# Patient Record
Sex: Female | Born: 1945
Health system: Southern US, Community
[De-identification: ages and names within clinical notes are randomized; demographics above are authoritative.]

## PROBLEM LIST (undated history)

## (undated) DIAGNOSIS — I5022 Chronic systolic (congestive) heart failure: Secondary | ICD-10-CM

## (undated) DIAGNOSIS — Z9989 Dependence on other enabling machines and devices: Secondary | ICD-10-CM

## (undated) DIAGNOSIS — Z95810 Presence of automatic (implantable) cardiac defibrillator: Secondary | ICD-10-CM

## (undated) DIAGNOSIS — E119 Type 2 diabetes mellitus without complications: Secondary | ICD-10-CM

## (undated) DIAGNOSIS — R269 Unspecified abnormalities of gait and mobility: Secondary | ICD-10-CM

## (undated) DIAGNOSIS — G4733 Obstructive sleep apnea (adult) (pediatric): Secondary | ICD-10-CM

## (undated) DIAGNOSIS — E669 Obesity, unspecified: Secondary | ICD-10-CM

## (undated) DIAGNOSIS — E042 Nontoxic multinodular goiter: Secondary | ICD-10-CM

## (undated) DIAGNOSIS — K759 Inflammatory liver disease, unspecified: Secondary | ICD-10-CM

## (undated) DIAGNOSIS — M545 Low back pain, unspecified: Secondary | ICD-10-CM

## (undated) DIAGNOSIS — G629 Polyneuropathy, unspecified: Secondary | ICD-10-CM

## (undated) DIAGNOSIS — I2510 Atherosclerotic heart disease of native coronary artery without angina pectoris: Secondary | ICD-10-CM

## (undated) DIAGNOSIS — J189 Pneumonia, unspecified organism: Secondary | ICD-10-CM

## (undated) DIAGNOSIS — H4920 Sixth [abducent] nerve palsy, unspecified eye: Secondary | ICD-10-CM

## (undated) DIAGNOSIS — N3090 Cystitis, unspecified without hematuria: Secondary | ICD-10-CM

## (undated) DIAGNOSIS — E785 Hyperlipidemia, unspecified: Secondary | ICD-10-CM

## (undated) DIAGNOSIS — J302 Other seasonal allergic rhinitis: Secondary | ICD-10-CM

## (undated) DIAGNOSIS — I119 Hypertensive heart disease without heart failure: Secondary | ICD-10-CM

## (undated) DIAGNOSIS — R413 Other amnesia: Secondary | ICD-10-CM

## (undated) DIAGNOSIS — G510 Bell's palsy: Secondary | ICD-10-CM

## (undated) DIAGNOSIS — G8929 Other chronic pain: Secondary | ICD-10-CM

## (undated) DIAGNOSIS — I639 Cerebral infarction, unspecified: Secondary | ICD-10-CM

## (undated) HISTORY — PX: OTHER SURGICAL HISTORY: SHX169

## (undated) HISTORY — DX: Other amnesia: R41.3

## (undated) HISTORY — PX: TUBAL LIGATION: SHX77

## (undated) HISTORY — PX: INSERT / REPLACE / REMOVE PACEMAKER: SUR710

## (undated) HISTORY — DX: Bell's palsy: G51.0

## (undated) HISTORY — DX: Obesity, unspecified: E66.9

## (undated) HISTORY — DX: Hypertensive heart disease without heart failure: I11.9

## (undated) HISTORY — DX: Unspecified abnormalities of gait and mobility: R26.9

## (undated) HISTORY — DX: Inflammatory liver disease, unspecified: K75.9

## (undated) HISTORY — DX: Polyneuropathy, unspecified: G62.9

## (undated) HISTORY — DX: Other seasonal allergic rhinitis: J30.2

## (undated) HISTORY — DX: Hyperlipidemia, unspecified: E78.5

## (undated) HISTORY — DX: Nontoxic multinodular goiter: E04.2

## (undated) HISTORY — PX: PLANTAR FASCIA RELEASE: SHX2239

## (undated) HISTORY — PX: CARPAL TUNNEL RELEASE: SHX101

## (undated) HISTORY — PX: CYSTECTOMY: SUR359

## (undated) HISTORY — DX: Sixth (abducent) nerve palsy, unspecified eye: H49.20

---

## 1998-09-13 HISTORY — PX: CORONARY ARTERY BYPASS GRAFT: SHX141

## 1999-05-15 ENCOUNTER — Other Ambulatory Visit: Admission: RE | Admit: 1999-05-15 | Discharge: 1999-05-15 | Payer: Self-pay | Admitting: Internal Medicine

## 1999-07-20 ENCOUNTER — Encounter: Payer: Self-pay | Admitting: Cardiology

## 1999-07-20 ENCOUNTER — Inpatient Hospital Stay (HOSPITAL_COMMUNITY): Admission: EM | Admit: 1999-07-20 | Discharge: 1999-07-26 | Payer: Self-pay | Admitting: Emergency Medicine

## 1999-07-21 ENCOUNTER — Encounter: Payer: Self-pay | Admitting: Internal Medicine

## 1999-07-22 ENCOUNTER — Encounter: Payer: Self-pay | Admitting: Cardiology

## 1999-07-23 ENCOUNTER — Encounter: Payer: Self-pay | Admitting: Cardiology

## 1999-07-24 ENCOUNTER — Encounter: Payer: Self-pay | Admitting: Surgery

## 1999-07-26 ENCOUNTER — Encounter: Payer: Self-pay | Admitting: Internal Medicine

## 1999-08-25 ENCOUNTER — Encounter (HOSPITAL_COMMUNITY): Admission: RE | Admit: 1999-08-25 | Discharge: 1999-11-23 | Payer: Self-pay | Admitting: Cardiology

## 1999-12-15 ENCOUNTER — Encounter: Admission: RE | Admit: 1999-12-15 | Discharge: 2000-03-14 | Payer: Self-pay | Admitting: Orthopedic Surgery

## 2000-03-21 ENCOUNTER — Encounter: Admission: RE | Admit: 2000-03-21 | Discharge: 2000-06-19 | Payer: Self-pay | Admitting: Orthopedic Surgery

## 2001-07-20 ENCOUNTER — Other Ambulatory Visit: Admission: RE | Admit: 2001-07-20 | Discharge: 2001-07-20 | Payer: Self-pay | Admitting: Internal Medicine

## 2001-10-18 ENCOUNTER — Encounter: Payer: Self-pay | Admitting: Internal Medicine

## 2002-04-25 ENCOUNTER — Encounter: Payer: Self-pay | Admitting: Internal Medicine

## 2004-05-06 ENCOUNTER — Encounter: Payer: Self-pay | Admitting: Internal Medicine

## 2004-05-06 ENCOUNTER — Other Ambulatory Visit: Admission: RE | Admit: 2004-05-06 | Discharge: 2004-05-06 | Payer: Self-pay | Admitting: Internal Medicine

## 2004-08-12 ENCOUNTER — Ambulatory Visit: Payer: Self-pay | Admitting: Internal Medicine

## 2004-11-11 ENCOUNTER — Ambulatory Visit: Payer: Self-pay | Admitting: Internal Medicine

## 2004-11-18 ENCOUNTER — Ambulatory Visit: Payer: Self-pay | Admitting: Internal Medicine

## 2005-02-25 ENCOUNTER — Ambulatory Visit: Payer: Self-pay | Admitting: Internal Medicine

## 2005-05-26 ENCOUNTER — Ambulatory Visit: Payer: Self-pay | Admitting: Internal Medicine

## 2005-08-25 ENCOUNTER — Ambulatory Visit: Payer: Self-pay | Admitting: Internal Medicine

## 2005-09-28 ENCOUNTER — Ambulatory Visit: Payer: Self-pay | Admitting: Internal Medicine

## 2005-11-22 ENCOUNTER — Ambulatory Visit: Payer: Self-pay | Admitting: Internal Medicine

## 2005-12-16 ENCOUNTER — Ambulatory Visit (HOSPITAL_BASED_OUTPATIENT_CLINIC_OR_DEPARTMENT_OTHER): Admission: RE | Admit: 2005-12-16 | Discharge: 2005-12-16 | Payer: Self-pay | Admitting: Neurology

## 2005-12-19 ENCOUNTER — Ambulatory Visit: Payer: Self-pay | Admitting: Internal Medicine

## 2006-01-05 ENCOUNTER — Ambulatory Visit: Payer: Self-pay | Admitting: Pulmonary Disease

## 2006-01-31 ENCOUNTER — Ambulatory Visit: Payer: Self-pay | Admitting: Pulmonary Disease

## 2006-01-31 ENCOUNTER — Ambulatory Visit (HOSPITAL_BASED_OUTPATIENT_CLINIC_OR_DEPARTMENT_OTHER): Admission: RE | Admit: 2006-01-31 | Discharge: 2006-01-31 | Payer: Self-pay | Admitting: Pulmonary Disease

## 2006-02-25 ENCOUNTER — Ambulatory Visit: Payer: Self-pay | Admitting: Internal Medicine

## 2006-03-04 ENCOUNTER — Ambulatory Visit: Payer: Self-pay | Admitting: Internal Medicine

## 2006-03-07 ENCOUNTER — Ambulatory Visit: Payer: Self-pay | Admitting: Pulmonary Disease

## 2006-03-30 ENCOUNTER — Encounter: Payer: Self-pay | Admitting: Internal Medicine

## 2006-04-26 ENCOUNTER — Ambulatory Visit: Payer: Self-pay | Admitting: Pulmonary Disease

## 2006-05-26 ENCOUNTER — Ambulatory Visit: Payer: Self-pay | Admitting: Internal Medicine

## 2006-08-15 ENCOUNTER — Ambulatory Visit: Payer: Self-pay | Admitting: Internal Medicine

## 2006-08-15 LAB — CONVERTED CEMR LAB: Hgb A1c MFr Bld: 7.4 % — ABNORMAL HIGH (ref 4.6–6.0)

## 2006-08-22 ENCOUNTER — Ambulatory Visit: Payer: Self-pay | Admitting: Internal Medicine

## 2006-11-15 ENCOUNTER — Ambulatory Visit: Payer: Self-pay | Admitting: Internal Medicine

## 2006-11-15 LAB — CONVERTED CEMR LAB: Hgb A1c MFr Bld: 8.5 % — ABNORMAL HIGH (ref 4.6–6.0)

## 2007-01-30 ENCOUNTER — Encounter: Payer: Self-pay | Admitting: Internal Medicine

## 2007-01-30 DIAGNOSIS — I2510 Atherosclerotic heart disease of native coronary artery without angina pectoris: Secondary | ICD-10-CM | POA: Insufficient documentation

## 2007-01-30 DIAGNOSIS — I119 Hypertensive heart disease without heart failure: Secondary | ICD-10-CM | POA: Insufficient documentation

## 2007-01-30 DIAGNOSIS — J309 Allergic rhinitis, unspecified: Secondary | ICD-10-CM | POA: Insufficient documentation

## 2007-01-30 DIAGNOSIS — E1040 Type 1 diabetes mellitus with diabetic neuropathy, unspecified: Secondary | ICD-10-CM | POA: Insufficient documentation

## 2007-03-07 ENCOUNTER — Ambulatory Visit: Payer: Self-pay | Admitting: Internal Medicine

## 2007-03-07 LAB — CONVERTED CEMR LAB: Hgb A1c MFr Bld: 7.6 % — ABNORMAL HIGH (ref 4.6–6.0)

## 2007-03-08 ENCOUNTER — Encounter: Payer: Self-pay | Admitting: Internal Medicine

## 2007-06-06 ENCOUNTER — Ambulatory Visit: Payer: Self-pay | Admitting: Internal Medicine

## 2007-06-06 LAB — CONVERTED CEMR LAB
ALT: 82 units/L — ABNORMAL HIGH (ref 0–35)
AST: 64 units/L — ABNORMAL HIGH (ref 0–37)
Albumin: 4.5 g/dL (ref 3.5–5.2)
Basophils Absolute: 0 10*3/uL (ref 0.0–0.1)
Calcium: 10.5 mg/dL (ref 8.4–10.5)
Chloride: 100 meq/L (ref 96–112)
Creatinine,U: 136.3 mg/dL
Eosinophils Absolute: 0.1 10*3/uL (ref 0.0–0.6)
GFR calc non Af Amer: 78 mL/min
Hgb A1c MFr Bld: 7.9 % — ABNORMAL HIGH (ref 4.6–6.0)
MCHC: 34.6 g/dL (ref 30.0–36.0)
MCV: 92.5 fL (ref 78.0–100.0)
Monocytes Relative: 9.8 % (ref 3.0–11.0)
Platelets: 274 10*3/uL (ref 150–400)
RBC: 4.56 M/uL (ref 3.87–5.11)
Sodium: 139 meq/L (ref 135–145)
TSH: 0.85 microintl units/mL (ref 0.35–5.50)

## 2007-06-07 ENCOUNTER — Telehealth: Payer: Self-pay | Admitting: Internal Medicine

## 2007-06-09 ENCOUNTER — Ambulatory Visit: Payer: Self-pay | Admitting: Internal Medicine

## 2007-06-09 DIAGNOSIS — R110 Nausea: Secondary | ICD-10-CM | POA: Insufficient documentation

## 2007-08-28 ENCOUNTER — Ambulatory Visit: Payer: Self-pay | Admitting: Internal Medicine

## 2007-11-27 ENCOUNTER — Ambulatory Visit: Payer: Self-pay | Admitting: Internal Medicine

## 2007-11-28 LAB — CONVERTED CEMR LAB
Alkaline Phosphatase: 53 units/L (ref 39–117)
Bilirubin, Direct: 0.1 mg/dL (ref 0.0–0.3)
Hgb A1c MFr Bld: 8.7 % — ABNORMAL HIGH (ref 4.6–6.0)
Total Protein: 7 g/dL (ref 6.0–8.3)

## 2007-12-06 ENCOUNTER — Ambulatory Visit: Payer: Self-pay | Admitting: Internal Medicine

## 2007-12-06 DIAGNOSIS — K759 Inflammatory liver disease, unspecified: Secondary | ICD-10-CM | POA: Insufficient documentation

## 2007-12-06 LAB — CONVERTED CEMR LAB
Hep B Core Total Ab: NEGATIVE
Hepatitis B Surface Ag: NEGATIVE

## 2007-12-15 ENCOUNTER — Telehealth: Payer: Self-pay | Admitting: Internal Medicine

## 2008-01-04 ENCOUNTER — Ambulatory Visit: Payer: Self-pay | Admitting: Internal Medicine

## 2008-01-18 ENCOUNTER — Ambulatory Visit: Payer: Self-pay | Admitting: Internal Medicine

## 2008-03-11 ENCOUNTER — Encounter: Payer: Self-pay | Admitting: Internal Medicine

## 2008-03-11 ENCOUNTER — Telehealth: Payer: Self-pay | Admitting: Internal Medicine

## 2008-04-18 ENCOUNTER — Ambulatory Visit: Payer: Self-pay | Admitting: Internal Medicine

## 2008-06-26 ENCOUNTER — Encounter (INDEPENDENT_AMBULATORY_CARE_PROVIDER_SITE_OTHER): Payer: Self-pay

## 2008-07-12 ENCOUNTER — Ambulatory Visit: Payer: Self-pay | Admitting: Internal Medicine

## 2008-07-12 LAB — CONVERTED CEMR LAB: Hgb A1c MFr Bld: 8.8 % — ABNORMAL HIGH (ref 4.6–6.0)

## 2008-08-07 ENCOUNTER — Ambulatory Visit: Payer: Self-pay | Admitting: Internal Medicine

## 2008-09-27 ENCOUNTER — Ambulatory Visit: Payer: Self-pay | Admitting: Internal Medicine

## 2008-12-26 ENCOUNTER — Ambulatory Visit: Payer: Self-pay | Admitting: Internal Medicine

## 2009-02-25 ENCOUNTER — Encounter: Payer: Self-pay | Admitting: Internal Medicine

## 2009-03-21 ENCOUNTER — Ambulatory Visit: Payer: Self-pay | Admitting: Internal Medicine

## 2009-03-21 LAB — CONVERTED CEMR LAB
AST: 49 units/L — ABNORMAL HIGH (ref 0–37)
Albumin: 4 g/dL (ref 3.5–5.2)
Alkaline Phosphatase: 60 units/L (ref 39–117)
Basophils Absolute: 0.1 10*3/uL (ref 0.0–0.1)
Basophils Relative: 1.1 % (ref 0.0–3.0)
Bilirubin, Direct: 0 mg/dL (ref 0.0–0.3)
Blood in Urine, dipstick: NEGATIVE
Calcium: 9.8 mg/dL (ref 8.4–10.5)
Creatinine, Ser: 0.7 mg/dL (ref 0.4–1.2)
Creatinine,U: 83.8 mg/dL
Direct LDL: 157.2 mg/dL
GFR calc non Af Amer: 89.94 mL/min (ref >60)
HDL: 37.9 mg/dL — ABNORMAL LOW (ref >39.00)
Hemoglobin: 14 g/dL (ref 12.0–15.0)
Ketones, urine, test strip: NEGATIVE
Lymphocytes Relative: 40.2 % (ref 12.0–46.0)
Microalb, Ur: 0.6 mg/dL (ref 0.0–1.9)
Monocytes Relative: 10.8 % (ref 3.0–12.0)
Neutro Abs: 2.1 10*3/uL (ref 1.4–7.7)
Neutrophils Relative %: 44.4 % (ref 43.0–77.0)
Nitrite: NEGATIVE
Protein, U semiquant: NEGATIVE
RBC: 4.36 M/uL (ref 3.87–5.11)
Sodium: 140 meq/L (ref 135–145)
Total CHOL/HDL Ratio: 6
Total Protein: 7.3 g/dL (ref 6.0–8.3)
Triglycerides: 178 mg/dL — ABNORMAL HIGH (ref 0.0–149.0)
Urobilinogen, UA: 0.2
VLDL: 35.6 mg/dL (ref 0.0–40.0)

## 2009-04-03 ENCOUNTER — Ambulatory Visit: Payer: Self-pay | Admitting: Internal Medicine

## 2009-04-24 ENCOUNTER — Encounter: Payer: Self-pay | Admitting: Internal Medicine

## 2009-06-13 ENCOUNTER — Telehealth: Payer: Self-pay | Admitting: Internal Medicine

## 2009-06-17 ENCOUNTER — Telehealth (INDEPENDENT_AMBULATORY_CARE_PROVIDER_SITE_OTHER): Payer: Self-pay | Admitting: *Deleted

## 2009-06-17 ENCOUNTER — Ambulatory Visit: Payer: Self-pay | Admitting: Internal Medicine

## 2009-08-05 ENCOUNTER — Ambulatory Visit: Payer: Self-pay | Admitting: Internal Medicine

## 2009-08-05 LAB — CONVERTED CEMR LAB: Hgb A1c MFr Bld: 9 % — ABNORMAL HIGH (ref 4.6–6.5)

## 2009-11-04 ENCOUNTER — Ambulatory Visit: Payer: Self-pay | Admitting: Internal Medicine

## 2009-11-05 LAB — CONVERTED CEMR LAB: Hgb A1c MFr Bld: 8.8 % — ABNORMAL HIGH (ref 4.6–6.5)

## 2009-11-06 ENCOUNTER — Telehealth: Payer: Self-pay

## 2009-12-01 ENCOUNTER — Telehealth: Payer: Self-pay

## 2009-12-03 ENCOUNTER — Telehealth: Payer: Self-pay | Admitting: Internal Medicine

## 2010-01-05 ENCOUNTER — Ambulatory Visit: Payer: Self-pay | Admitting: Internal Medicine

## 2010-01-05 DIAGNOSIS — G510 Bell's palsy: Secondary | ICD-10-CM | POA: Insufficient documentation

## 2010-01-05 HISTORY — DX: Bell's palsy: G51.0

## 2010-02-24 ENCOUNTER — Encounter: Payer: Self-pay | Admitting: Internal Medicine

## 2010-04-03 ENCOUNTER — Ambulatory Visit: Payer: Self-pay | Admitting: Vascular Surgery

## 2010-04-03 ENCOUNTER — Encounter: Payer: Self-pay | Admitting: Internal Medicine

## 2010-04-06 ENCOUNTER — Ambulatory Visit: Payer: Self-pay | Admitting: Internal Medicine

## 2010-04-06 LAB — HM DIABETES EYE EXAM: HM Diabetic Eye Exam: NORMAL

## 2010-04-07 ENCOUNTER — Telehealth: Payer: Self-pay

## 2010-04-24 ENCOUNTER — Ambulatory Visit: Payer: Self-pay | Admitting: Endocrinology

## 2010-05-04 ENCOUNTER — Encounter: Admission: RE | Admit: 2010-05-04 | Discharge: 2010-05-04 | Payer: Self-pay | Admitting: Endocrinology

## 2010-05-14 ENCOUNTER — Ambulatory Visit: Payer: Self-pay | Admitting: Endocrinology

## 2010-05-14 DIAGNOSIS — E042 Nontoxic multinodular goiter: Secondary | ICD-10-CM | POA: Insufficient documentation

## 2010-05-14 HISTORY — PX: BIOPSY THYROID: PRO38

## 2010-05-15 LAB — CONVERTED CEMR LAB: TSH: 1.1 microintl units/mL (ref 0.35–5.50)

## 2010-05-28 ENCOUNTER — Ambulatory Visit: Payer: Self-pay | Admitting: Endocrinology

## 2010-06-10 ENCOUNTER — Other Ambulatory Visit: Admission: RE | Admit: 2010-06-10 | Discharge: 2010-06-10 | Payer: Self-pay | Admitting: Interventional Radiology

## 2010-06-10 ENCOUNTER — Encounter: Admission: RE | Admit: 2010-06-10 | Discharge: 2010-06-10 | Payer: Self-pay | Admitting: Endocrinology

## 2010-06-10 ENCOUNTER — Encounter: Payer: Self-pay | Admitting: Endocrinology

## 2010-06-26 ENCOUNTER — Ambulatory Visit: Payer: Self-pay | Admitting: Endocrinology

## 2010-06-30 ENCOUNTER — Ambulatory Visit: Payer: Self-pay | Admitting: Internal Medicine

## 2010-06-30 LAB — HM DIABETES FOOT EXAM

## 2010-07-31 ENCOUNTER — Ambulatory Visit: Payer: Self-pay | Admitting: Endocrinology

## 2010-08-10 ENCOUNTER — Telehealth: Payer: Self-pay | Admitting: Endocrinology

## 2010-08-28 ENCOUNTER — Encounter: Payer: Self-pay | Admitting: Endocrinology

## 2010-08-28 ENCOUNTER — Ambulatory Visit: Payer: Self-pay | Admitting: Endocrinology

## 2010-09-16 ENCOUNTER — Telehealth: Payer: Self-pay | Admitting: Internal Medicine

## 2010-10-13 NOTE — Assessment & Plan Note (Signed)
Summary: 3 wk f/u #/cd   Vital Signs:  Patient profile:   65 year old female Height:      63 inches (160.02 cm) Weight:      192.50 pounds (87.50 kg) BMI:     34.22 O2 Sat:      94 % on Room air Temp:     98.8 degrees F (37.11 degrees C) oral Pulse rate:   86 / minute BP sitting:   134 / 70  (left arm) Cuff size:   regular  Vitals Entered By: Brenton Grills MA (June 26, 2010 9:09 AM)  O2 Flow:  Room air CC: 3 week F/U/aj Is Patient Diabetic? Yes   CC:  3 week F/U/aj.  History of Present Illness: she brings a record of her cbg's which i have reviewed today.  it varies from mid-100's to 300.  it is lowest before breakfast and lunch.  it is highest before supper, and at hs.  pt states she feels well in general.   Current Medications (verified): 1)  Astelin 137 Mcg/spray Soln (Azelastine Hcl) .... Spray 2 Spray Into Both Nostrils Twice A Day 2)  Chlorthalidone 25 Mg Tabs (Chlorthalidone) .[name].. 1 Tablet By Mouth Once A Day 3)  Fexofenadine Hcl 180 Mg Tabs (Fexofenadine Hcl) .[name].. 1 Once Daily 4)  Lisinopril 40 Mg Tabs (Lisinopril) .... Take 1 Tablet By Mouth Once A Day 5)  Metformin Hcl 1000 Mg Tabs (Metformin Hcl) .[name].. 1 By Mouth Once Daily 6)  Potassium Chloride Crys Cr 20 Meq Tbcr (Potassium Chloride Crys Cr) .... Take 1 Tablet By Mouth Once Daily 7)  Toprol Xl 50 Mg Tb24 (Metoprolol Succinate) .... Take 1 Tablet By Mouth Once A Day 8)  Multivitamins   Tabs (Multiple Vitamin) .... Once Daily 9)  Calcium 500 Mg  Tabs (Calcium) .... Two Times A Day 10)  Tricor 145 Mg  Tabs (Fenofibrate) .... Take 1 Tablet By Mouth Once A Day 11)  Adult Aspirin Ec Low Strength 81 Mg  Tbec (Aspirin) .[name].. 1 Once Daily 12)  Lantus Solostar 100 Unit/ml  Soln (Insulin Glargine) .... 40 Units Two Times A Day 13)  Humalog Kwikpen 100 Unit/ml  Soln (Insulin Lispro (Human)) .... Three Times A Day (Just Before Each Meal) 70-70-100 Units 14)  Accu-Chek Compact Test Drum   Strp (Glucose Blood) .... Use Four  Times A Day 15)  Ketoconazole 2 % Crea (Ketoconazole) .... Apply As Needed 16)  Bd U/f Short Pen Needle 31g X 8 Mm Misc (Insulin Pen Needle) .... Use Qid and Hs 17)  Crestor 10 Mg Tabs (Rosuvastatin Calcium) .... One Tablet Twice Weekly  Allergies (verified): No Known Drug Allergies  Past History:  Past Medical History: Last updated: 01/05/2010 Allergic rhinitis Coronary artery disease Diabetes mellitus, type II Hypertension Hyperlipidemia elevated transaminases Bell's palsy, April 2011  Review of Systems  The patient denies hypoglycemia.    Physical Exam  General:  obese.  no distress  Psych:  Alert and cooperative; normal mood and affect; normal attention span and concentration.     Impression & Recommendations:  Problem # 1:  DIABETES MELLITUS, TYPE II (ICD-250.00) the pattern of her cbg's indicates the need for less lantus, and more humalog  Problem # 2:  GOITER, MULTINODULAR (ICD-241.1) bx is low-risk  Medications Added to Medication List This Visit: 1)  Metformin Hcl 1000 Mg Tabs (Metformin hcl) .[name].. 1 by mouth once daily 2)  Lantus Solostar 100 Unit/ml Soln (Insulin glargine) .... 70 units at bedtime  3)  Humalog Kwikpen 100 Unit/ml Soln (Insulin lispro (human)) .... Three times a day (just before each meal) 70-90-120 units  Other Orders: Est. Patient Level III (16109)  Patient Instructions: 1)  check your blood sugar 4 times a day--before the 3 meals, and at bedtime.  also check if you have symptoms of your blood sugar being too high or too low.  please keep a record of the readings and bring it to your next appointment here.  please call us sooner if you are having low blood sugar episodes. 2)  reduce lantus to 70 units at bedtime, and:  3)  increase humalog to three times a day (just before each meal), 70-90-120 units. 4)  Please schedule a follow-up appointment in 1 month. 5)  stop metformin. 6)  we'll recheck thyroid ultrasound next year.

## 2010-10-13 NOTE — Assessment & Plan Note (Signed)
Summary: NEW ENDO CON/DM/PER DR K  / #/NWS   Vital Signs:  Patient profile:   65 year old female Height:      63 inches (160.02 cm) Weight:      192.25 pounds (87.39 kg) BMI:     34.18 O2 Sat:      95 % on Room air Temp:     99.1 degrees F (37.28 degrees C) oral Pulse rate:   74 / minute BP sitting:   138 / 74  (left arm) Cuff size:   regular  Vitals Entered By: Brenton Grills MA (April 24, 2010 2:13 PM)  O2 Flow:  Room air CC: New Endo/DM/Dr. Wilhemena Durie Is Patient Diabetic? Yes Comments Pt states she is also taking Calcet Petites Dual Vitamin D3, B12 sublingual (B-6) Folic Acid, Mega D3, I-Flex, Digestive Advantage (1 tablet once daily )   CC:  New Endo/DM/Dr. Wilhemena Durie.  History of Present Illness: pt states 16 years h/o dm.  she has several chronic complications.  she has been on insulin x 5 years.  she takes lantus and humalog.  she says dm was well-controlled, until the past few years, when cbg's suddenly increased.  she is unable to cite reason for this deterioration in control.  she forgot cbg record today, but states cbg's varies from 86-300.  in general, it goes higher as the day goes on.    pt says his diet and exercise are "fair."   symptomatically, pt states few years of moderate numbness of the feet, and associated pain.   Current Medications (verified): 1)  Astelin 137 Mcg/spray Soln (Azelastine Hcl) .... Spray 2 Spray Into Both Nostrils Twice A Day 2)  Chlorthalidone 25 Mg Tabs (Chlorthalidone) .[name].. 1 Tablet By Mouth Once A Day 3)  Fexofenadine Hcl 180 Mg Tabs (Fexofenadine Hcl) .[name].. 1 Once Daily 4)  Lisinopril 40 Mg Tabs (Lisinopril) .... Take 1 Tablet By Mouth Once A Day 5)  Metformin Hcl 1000 Mg Tabs (Metformin Hcl) .[name].. 1 Two Times A Day 6)  Potassium Chloride Crys Cr 20 Meq Tbcr (Potassium Chloride Crys Cr) .... Take 1 Tablet By Mouth Once Daily 7)  Toprol Xl 50 Mg Tb24 (Metoprolol Succinate) .... Take 1 Tablet By Mouth Once A Day 8)  Multivitamins    Tabs (Multiple Vitamin) .... Once Daily 9)  Calcium 500 Mg  Tabs (Calcium) .... Two Times A Day 10)  Tricor 145 Mg  Tabs (Fenofibrate) .... Take 1 Tablet By Mouth Once A Day 11)  Adult Aspirin Ec Low Strength 81 Mg  Tbec (Aspirin) .[name].. 1 Once Daily 12)  Lantus Solostar 100 Unit/ml  Soln (Insulin Glargine) .... 60 Units Every Morning and 100 Units At Bedtime 13)  Humalog Kwikpen 100 Unit/ml  Soln (Insulin Lispro (Human)) .... 30 Units Prior Each Meal Three Times A Day; Bs Over 150 Add 10 Units; If Bs Over 250 Add 15 Units 14)  Accu-Chek Compact Test Drum   Strp (Glucose Blood) .... Use Four Times A Day 15)  Ketoconazole 2 % Crea (Ketoconazole) .... Apply As Needed 16)  Bd U/f Short Pen Needle 31g X 8 Mm Misc (Insulin Pen Needle) .... Use Qid and Hs 17)  Crestor 10 Mg Tabs (Rosuvastatin Calcium) .... One Tablet Twice Weekly  Allergies (verified): No Known Drug Allergies  Past History:  Past Medical History: Last updated: 01/05/2010 Allergic rhinitis Coronary artery disease Diabetes mellitus, type II Hypertension Hyperlipidemia elevated transaminases Bell's palsy, April 2011  Family History: Reviewed history from 04/03/2009 and no  changes required. father died age 32, and live mother died age 29, complications  of diabetes, coronary artery disease 4 sisters, positive diabetes, coronary artery disease,  dm: father, 2 sibs, 2 children  Social History: Reviewed history from 04/03/2009 and no changes required. Married Never Smoked 2 children does not work outside the home  Review of Systems       The patient complains of weight gain.         denies blurry vision, headache, chest pain, sob, n/v, urinary frequency, excessive diaphoresis, memory loss, depression, rhinorrhea,  menopausal sxs, and easy bruising.  she has intermittent leg cramps.    Physical Exam  General:  normal appearance.   Head:  head: no deformity eyes: no periorbital swelling, no proptosis external nose  and ears are normal mouth: no lesion seen Neck:  ? of left thyroid mass Chest Wall:  old median sternotomy scar Lungs:  Clear to auscultation bilaterally. Normal respiratory effort.  Heart:  Regular rate and rhythm without murmurs or gallops noted. Normal S1,S2.   Abdomen:  abdomen is soft, nontender.  no hepatosplenomegaly.   not distended.  no hernia  Msk:  muscle bulk and strength are grossly normal.  no obvious joint swelling.  gait is normal and steady  Pulses:  dorsalis pedis intact bilat.  no carotid bruit  Extremities:  no deformity.  no ulcer on the feet.  feet are of normal color and temp.   1+ right pedal edema and 1+ left pedal edema.   there is a vein harvest scar at the right leg Neurologic:  cn 2-12 grossly intact.   readily moves all 4's.   sensation is intact to touch on the feet, but decreased from normal Skin:  normal texture and temp.  no rash.  not diaphoretic  Cervical Nodes:  No significant adenopathy.  Psych:  Alert and cooperative; normal mood and affect; normal attention span and concentration.     Impression & Recommendations:  Problem # 1:  DIABETES MELLITUS, TYPE II (ICD-250.00) needs increased rx  Problem # 2:  THYROID NODULE, LEFT (ICD-241.0) incidentally noted  Problem # 3:  numbness prob due to #1  Medications Added to Medication List This Visit: 1)  Lantus Solostar 100 Unit/ml Soln (Insulin glargine) .... 65 units two times a day 2)  Humalog Kwikpen 100 Unit/ml Soln (Insulin lispro (human)) .... 40 units three times a day (just before each meal)  Other Orders: Radiology Referral (Radiology) Consultation Level IV (57846)  Patient Instructions: 1)  good diet and exercise habits significanly improve the control of your diabetes.  please let me know if you wish to be referred to a dietician.  high blood sugar is very risky to your health.  you should see an eye doctor every year. 2)  controlling your blood pressure and cholesterol drastically  reduces the damage diabetes does to your body.  this also applies to quitting smoking.  please discuss these with your doctor.  you should take an aspirin every day, unless you have been advised by a doctor not to. 3)  check your blood sugar 4 times a day--before the 3 meals, and at bedtime.  also check if you have symptoms of your blood sugar being too high or too low.  please keep a record of the readings and bring it to your next appointment here.  please call us sooner if you are having low blood sugar episodes. 4)  we will need to take this complex situation in stages.  5)  for now, reduce lantus to 65 units two times a day, and: 6)  increase humalog to 50 units three times a day (just before each meal) 7)  check thyroid ultrasound.  you will be called with a day and time for an appointment 8)  Please schedule a follow-up appointment in 2-3 weeks.

## 2010-10-13 NOTE — Letter (Signed)
Summary: [facility]  [facility]   Imported By: Maryln Gottron 05/05/2010 10:41:58  _____________________________________________________________________  External Attachment:    Type:   Image     Comment:   External Document

## 2010-10-13 NOTE — Miscellaneous (Signed)
Summary: Waiver of Liability for [employer]  Waiver of Liability for [employer]   Imported By: Maryln Gottron 11/06/2009 13:23:56  _____________________________________________________________________  External Attachment:    Type:   Image     Comment:   External Document

## 2010-10-13 NOTE — Assessment & Plan Note (Signed)
Summary: 1 MTH FU---STC   Vital Signs:  Patient profile:   65 year old female Height:      63 inches (160.02 cm) Weight:      191.38 pounds (86.99 kg) BMI:     34.02 O2 Sat:      95 % on Room air Temp:     98.3 degrees F (36.83 degrees C) oral Pulse rate:   88 / minute Pulse rhythm:   regular BP sitting:   118 / 68  (left arm) Cuff size:   regular  Vitals Entered By: Brenton Grills CMA Duncan Dull) (July 31, 2010 9:33 AM)  O2 Flow:  Room air CC: 1 month F/U/aj Is Patient Diabetic? Yes   CC:  1 month F/U/aj.  History of Present Illness: she brings a record of her cbg's which i have reviewed today.  it varies from 180 (am and afternoon) to 300 (other times of day).  she has fatigue.   Current Medications (verified): 1)  Astelin 137 Mcg/spray Soln (Azelastine Hcl) .... Spray 2 Spray Into Both Nostrils Twice A Day 2)  Chlorthalidone 25 Mg Tabs (Chlorthalidone) .[name].. 1 Tablet By Mouth Once A Day 3)  Fexofenadine Hcl 180 Mg Tabs (Fexofenadine Hcl) .[name].. 1 Once Daily 4)  Lisinopril 40 Mg Tabs (Lisinopril) .... Take 1 Tablet By Mouth Once A Day 5)  Potassium Chloride Crys Cr 20 Meq Tbcr (Potassium Chloride Crys Cr) .... Take 1 Tablet By Mouth Once Daily 6)  Toprol Xl 50 Mg Tb24 (Metoprolol Succinate) .... Take 1 Tablet By Mouth Once A Day 7)  Multivitamins   Tabs (Multiple Vitamin) .... Once Daily 8)  Calcium 500 Mg  Tabs (Calcium) .... Two Times A Day 9)  Tricor 145 Mg  Tabs (Fenofibrate) .... Take 1 Tablet By Mouth Once A Day 10)  Adult Aspirin Ec Low Strength 81 Mg  Tbec (Aspirin) .[name].. 1 Once Daily 11)  Lantus Solostar 100 Unit/ml  Soln (Insulin Glargine) .... 70 Units At Bedtime 12)  Humalog Kwikpen 100 Unit/ml  Soln (Insulin Lispro (Human)) .... Three Times A Day (Just Before Each Meal) 70-90-120 Units 13)  Accu-Chek Compact Test Drum   Strp (Glucose Blood) .... Use Four Times A Day 14)  Ketoconazole 2 % Crea (Ketoconazole) .... Apply As Needed 15)  Bd U/f Short Pen Needle 31g X 8  Mm Misc (Insulin Pen Needle) .... Use Qid and Hs 16)  Crestor 10 Mg Tabs (Rosuvastatin Calcium) .... One Tablet Twice Weekly  Allergies (verified): No Known Drug Allergies  Past History:  Past Medical History: Last updated: 06/30/2010 Allergic rhinitis Coronary artery disease Diabetes mellitus, type II Hypertension Hyperlipidemia elevated transaminases Bell's palsy, April 2011 mild thyromegaly  Review of Systems  The patient denies hypoglycemia.    Physical Exam  General:  obese.  no distress  Psych:  Alert and cooperative; normal mood and affect; normal attention span and concentration.     Impression & Recommendations:  Problem # 1:  DIABETES MELLITUS, TYPE II (ICD-250.00) she needs some adjustment in her therapy  Medications Added to Medication List This Visit: 1)  Lantus Solostar 100 Unit/ml Soln (Insulin glargine) .... 60 units at bedtime 2)  Humalog Kwikpen 100 Unit/ml Soln (Insulin lispro (human)) .... Three times a day (just before each meal) 90-90-160 units  Other Orders: Est. Patient Level III ([id number])  Patient Instructions: 1)  check your blood sugar 4 times a day--before the 3 meals, and at bedtime.  also check if you have symptoms of your  blood sugar being too high or too low.  please keep a record of the readings and bring it to your next appointment here.  please call us sooner if you are having low blood sugar episodes.  also, call sooner if it stays abov 200 at a time of day.   2)  reduce lantus to 60 units at bedtime, and:  3)  increase humalog to three times a day (just before each meal), 90-90-160 units. 4)  Please schedule a follow-up appointment in 1 month. Prescriptions: HUMALOG KWIKPEN 100 UNIT/ML  SOLN (INSULIN LISPRO (HUMAN)) three times a day (just before each meal) 90-90-160 units  #24 boxes x 3   Entered and Authorized by:   Minus Breeding MD   Signed by:   Minus Breeding MD on 07/31/2010   Method used:   Faxed to ...       [facility]  (mail-order)             , [place]         Ph: [phone]       Fax: [phone]   RxID:   [id number]    Orders Added: 1)  Est. Patient Level III [[id number]]

## 2010-10-13 NOTE — Assessment & Plan Note (Signed)
Summary: 1 - 2 WK FU  STC   Vital Signs:  Patient profile:   65 year old female Height:      63 inches (160.02 cm) Weight:      195 pounds (88.64 kg) BMI:     34.67 O2 Sat:      95 % on Room air Temp:     97.8 degrees F (36.56 degrees C) oral Pulse rate:   72 / minute BP sitting:   126 / 70  (left arm) Cuff size:   regular  Vitals Entered By: Brenton Grills MA (May 28, 2010 9:02 AM)  O2 Flow:  Room air CC: 2 week F/U/aj Is Patient Diabetic? Yes   CC:  2 week F/U/aj.  History of Present Illness: pt says her cbg's continue to be elevated.  she brings a record of her cbg's which i have reviewed today. it varies from 115-300.  it is in general highest at hs, and lowest before lunch.   Current Medications (verified): 1)  Astelin 137 Mcg/spray Soln (Azelastine Hcl) .... Spray 2 Spray Into Both Nostrils Twice A Day 2)  Chlorthalidone 25 Mg Tabs (Chlorthalidone) .[name].. 1 Tablet By Mouth Once A Day 3)  Fexofenadine Hcl 180 Mg Tabs (Fexofenadine Hcl) .[name].. 1 Once Daily 4)  Lisinopril 40 Mg Tabs (Lisinopril) .... Take 1 Tablet By Mouth Once A Day 5)  Metformin Hcl 1000 Mg Tabs (Metformin Hcl) .[name].. 1 Two Times A Day 6)  Potassium Chloride Crys Cr 20 Meq Tbcr (Potassium Chloride Crys Cr) .... Take 1 Tablet By Mouth Once Daily 7)  Toprol Xl 50 Mg Tb24 (Metoprolol Succinate) .... Take 1 Tablet By Mouth Once A Day 8)  Multivitamins   Tabs (Multiple Vitamin) .... Once Daily 9)  Calcium 500 Mg  Tabs (Calcium) .... Two Times A Day 10)  Tricor 145 Mg  Tabs (Fenofibrate) .... Take 1 Tablet By Mouth Once A Day 11)  Adult Aspirin Ec Low Strength 81 Mg  Tbec (Aspirin) .[name].. 1 Once Daily 12)  Lantus Solostar 100 Unit/ml  Soln (Insulin Glargine) .... 50 Units Two Times A Day 13)  Humalog Kwikpen 100 Unit/ml  Soln (Insulin Lispro (Human)) .... Three Times A Day (Just Before Each Meal) 60-60-80 Units 14)  Accu-Chek Compact Test Drum   Strp (Glucose Blood) .... Use Four Times A Day 15)  Ketoconazole 2  % Crea (Ketoconazole) .... Apply As Needed 16)  Bd U/f Short Pen Needle 31g X 8 Mm Misc (Insulin Pen Needle) .... Use Qid and Hs 17)  Crestor 10 Mg Tabs (Rosuvastatin Calcium) .... One Tablet Twice Weekly  Allergies (verified): No Known Drug Allergies  Past History:  Past Medical History: Last updated: 01/05/2010 Allergic rhinitis Coronary artery disease Diabetes mellitus, type II Hypertension Hyperlipidemia elevated transaminases Bell's palsy, April 2011  Review of Systems  The patient denies hypoglycemia.    Physical Exam  General:  obese.  no distress  Neck:  in the supine position, i cannot feel the thyroid nodule well enough to do the bx by palpation.   Impression & Recommendations:  Problem # 1:  DIABETES MELLITUS, TYPE II (ICD-250.00) needs increased rx  Problem # 2:  GOITER, MULTINODULAR (ICD-241.1) bx will need to be done guided by bx  Medications Added to Medication List This Visit: 1)  Lantus Solostar 100 Unit/ml Soln (Insulin glargine) .... 40 units two times a day 2)  Humalog Kwikpen 100 Unit/ml Soln (Insulin lispro (human)) .... Three times a day (just before each  meal) 70-70-100 units  Other Orders: Radiology Referral (Radiology) Est. Patient Level III (34742)  Patient Instructions: 1)  check your blood sugar 4 times a day--before the 3 meals, and at bedtime.  also check if you have symptoms of your blood sugar being too high or too low.  please keep a record of the readings and bring it to your next appointment here.  please call us sooner if you are having low blood sugar episodes. 2)  reduce lantus to 40 units two times a day, and: 3)  increase humalog to three times a day (just before each meal), 70-70-100 units. 4)  we'll have to do the thyroid biopsy at x-ray, guided by ultrasound.  you will be called with a day and time for an appointment 5)  to hear the results of your thyroid biopsy, please call [phone] to hear your test results. 6)  Please  schedule a follow-up appointment in 3 weeks.

## 2010-10-13 NOTE — Progress Notes (Signed)
Summary: OUT OF INSULIN  Phone Note Call from Patient   Summary of Call: Pt left vm. [facility] needs a call back before they can fill humalog [phone] REF # [phone]. Pt is out of insulin.  Initial call taken by: Lamar Sprinkles, CMA,  August 10, 2010 12:07 PM  [name]        [facility] wants to verify that the units are correct on the new rx Follow-up by: Brenton Grills CMA Duncan Dull),  August 10, 2010 1:32 PM  Additional [name] Additional follow up Details #1::        correct Additional Follow-up by: Minus Breeding MD,  August 10, 2010 1:48 PM    Additional [name] Additional follow up Details #2::    [facility] Pharmacist informed units are correct Left message for pt to callback office regarding rx for insulin Follow-up by: Brenton Grills CMA Duncan Dull),  August 10, 2010 2:56 PM  Additional [name] Additional follow up Details #3:: Details for Additional Follow-up Action Taken: Pt states that she is completely out of insulin Informed pt samples available for pick up Additional Follow-up by: Brenton Grills CMA Duncan Dull),  August 10, 2010 4:19 PM

## 2010-10-13 NOTE — Assessment & Plan Note (Signed)
Summary: 3 MONTH ROV/NJR PT RSC/NJR   Vital Signs:  Patient profile:   65 year old female Weight:      191 pounds Temp:     98.6 degrees F oral BP sitting:   160 / 80  (right arm) Cuff size:   regular  Vitals Entered By: Duard Brady LPN (April 06, 2010 10:54 AM) CC: 3 mos rov - ok     fbs 168 Is Patient Diabetic? Yes Did you bring your meter with you today? No   CC:  3 mos rov - ok     fbs 168.  History of Present Illness: 65 year old patient who is seen today for follow-up.  She has a history of coronary artery disease and dyslipidemia.  She was intolerant of Lipitor due to leg cramps and pravastatin 40 mg daily was started recently by cardiology.  She feels that the leg cramps have recurred.  These are bothersome, especially at night. She has a history of diabetes.  She is on 100 units of Lantus at bedtime, as well as mealtime insulin with sliding scale coverage.  Her last hemoglobin A1c8.7.  There is been no weight loss. Her cardiac status has been stable.  She has treated hypertension, which has done well. Due to a right leg edema.  She has had the venous Doppler studies performed as well as A. echocardiogram  Preventive Screening-Counseling & Management  Alcohol-Tobacco     Smoking Status: never  Allergies (verified): No Known Drug Allergies  Past History:  Past Medical History: Reviewed history from 01/05/2010 and no changes required. Allergic rhinitis Coronary artery disease Diabetes mellitus, type II Hypertension Hyperlipidemia elevated transaminases Bell's palsy, April 2011  Past Surgical History: Reviewed history from 12/06/2007 and no changes required. Coronary artery bypass graft 2000 Tubal ligation Cardiolite stress test July 2007 flexible sigmoidoscopy 2003  Review of Systems       The patient complains of peripheral edema and difficulty walking.  The patient denies anorexia, fever, weight loss, weight gain, vision loss, decreased hearing,  hoarseness, chest pain, syncope, dyspnea on exertion, prolonged cough, headaches, hemoptysis, abdominal pain, melena, hematochezia, severe indigestion/heartburn, hematuria, incontinence, genital sores, muscle weakness, suspicious skin lesions, depression, unusual weight change, abnormal bleeding, enlarged lymph nodes, angioedema, and breast masses.    Physical Exam  General:  overweight-appearing.  normal blood pressureoverweight-appearing.   Head:  Normocephalic and atraumatic without obvious abnormalities. No apparent alopecia or balding. Eyes:  No corneal or conjunctival inflammation noted. EOMI. Perrla. Funduscopic exam benign, without hemorrhages, exudates or papilledema. Vision grossly normal. Mouth:  Oral mucosa and oropharynx without lesions or exudates.  Teeth in good repair. Neck:  No deformities, masses, or tenderness noted. Lungs:  Normal respiratory effort, chest expands symmetrically. Lungs are clear to auscultation, no crackles or wheezes. Heart:  Normal rate and regular rhythm. S1 and S2 normal without gallop, murmur, click, rub or other extra sounds. Abdomen:  Bowel sounds positive,abdomen soft and non-tender without masses, organomegaly or hernias noted. Msk:  No deformity or scoliosis noted of thoracic or lumbar spine.    Diabetes Management Exam:    Foot Exam (with socks and/or shoes not present):       Sensory-Pinprick/Light touch:          Left medial foot (L-4): diminished          Left dorsal foot (L-5): diminished          Left lateral foot (S-1): diminished          Right  medial foot (L-4): diminished          Right dorsal foot (L-5): diminished          Right lateral foot (S-1): diminished       Sensory-Monofilament:          Left foot: diminished          Right foot: diminished       Inspection:          Left foot: normal          Right foot: normal       Nails:          Left foot: normal          Right foot: normal    Foot Exam by Podiatrist:       Date:  04/06/2010       Results: early diabetic findings       Done by: PCP    Eye Exam:       Eye Exam done here today          Results: normal   Impression & Recommendations:  Problem # 1:  HYPERLIPIDEMIA (ICD-272.4)  Her updated medication list for this problem includes:    Tricor 145 Mg Tabs (Fenofibrate) .[name]... Take 1 tablet by mouth once a day    Crestor 10 Mg Tabs (Rosuvastatin calcium) ..... One tablet twice weekly will discontinue pravastatin, and when she is asymptomatic.  Will give it a trial of  Crestor 10 mg twice weekly;  if this is not well tolerated.  Will try Livalo ( no samples today)  Her updated medication list for this problem includes:    Tricor 145 Mg Tabs (Fenofibrate) .[name]... Take 1 tablet by mouth once a day    Crestor 10 Mg Tabs (Rosuvastatin calcium) ..... One tablet twice weekly  Problem # 2:  HYPERTENSION (ICD-401.9)  Her updated medication list for this problem includes:    Chlorthalidone 25 Mg Tabs (Chlorthalidone) .[name]... 1 tablet by mouth once a day    Lisinopril 40 Mg Tabs (Lisinopril) .[name]... Take 1 tablet by mouth once a day    Toprol Xl 50 Mg Tb24 (Metoprolol succinate) .[name]... Take 1 tablet by mouth once a day  Her updated medication list for this problem includes:    Chlorthalidone 25 Mg Tabs (Chlorthalidone) .[name]... 1 tablet by mouth once a day    Lisinopril 40 Mg Tabs (Lisinopril) .[name]... Take 1 tablet by mouth once a day    Toprol Xl 50 Mg Tb24 (Metoprolol succinate) .[name]... Take 1 tablet by mouth once a day  Problem # 3:  DIABETES MELLITUS, TYPE II (ICD-250.00)  Her updated medication list for this problem includes:    Lisinopril 40 Mg Tabs (Lisinopril) .[name]... Take 1 tablet by mouth once a day    Metformin Hcl 1000 Mg Tabs (Metformin hcl) .[name]... 1 two times a day    Adult Aspirin Ec Low Strength 81 Mg Tbec (Aspirin) .[name]... 1 once daily    Lantus Solostar 100 Unit/ml Soln (Insulin glargine) .Marland KitchenMarland KitchenMarland KitchenMarland Kitchen 60 units every morning and 100 units at bedtime    Humalog  Kwikpen 100 Unit/ml Soln (Insulin lispro (human)) .Marland KitchenMarland KitchenMarland KitchenMarland Kitchen 30 units prior each meal three times a day; bs over 150 add 10 units; if bs over 250 add 15 units    Her updated medication list for this problem includes:    Lisinopril 40 Mg Tabs (Lisinopril) .[name]... Take 1 tablet by mouth once a day    Metformin Hcl  1000 Mg Tabs (Metformin hcl) .[name]... 1 two times a day    Adult Aspirin Ec Low Strength 81 Mg Tbec (Aspirin) .[name]... 1 once daily    Lantus Solostar 100 Unit/ml Soln (Insulin glargine) .Marland KitchenMarland KitchenMarland KitchenMarland Kitchen 60 units every morning and 100 units at bedtime    Humalog Kwikpen 100 Unit/ml Soln (Insulin lispro (human)) .Marland KitchenMarland KitchenMarland KitchenMarland Kitchen 30 units prior each meal three times a day; bs over 150 add 10 units; if bs over 250 add 15 units  Problem # 4:  CORONARY ARTERY DISEASE (ICD-414.00)  Her updated medication list for this problem includes:    Chlorthalidone 25 Mg Tabs (Chlorthalidone) .[name]... 1 tablet by mouth once a day    Lisinopril 40 Mg Tabs (Lisinopril) .[name]... Take 1 tablet by mouth once a day    Toprol Xl 50 Mg Tb24 (Metoprolol succinate) .[name]... Take 1 tablet by mouth once a day    Adult Aspirin Ec Low Strength 81 Mg Tbec (Aspirin) .[name]... 1 once daily  Her updated medication list for this problem includes:    Chlorthalidone 25 Mg Tabs (Chlorthalidone) .[name]... 1 tablet by mouth once a day    Lisinopril 40 Mg Tabs (Lisinopril) .[name]... Take 1 tablet by mouth once a day    Toprol Xl 50 Mg Tb24 (Metoprolol succinate) .[name]... Take 1 tablet by mouth once a day    Adult Aspirin Ec Low Strength 81 Mg Tbec (Aspirin) .[name]... 1 once daily  Complete Medication List: 1)  Astelin 137 Mcg/spray Soln (Azelastine hcl) .... Spray 2 spray into both nostrils twice a day 2)  Chlorthalidone 25 Mg Tabs (Chlorthalidone) .[name].. 1 tablet by mouth once a day 3)  Fexofenadine Hcl 180 Mg Tabs (Fexofenadine hcl) .[name].. 1 once daily 4)  Lisinopril 40 Mg Tabs (Lisinopril) .... Take 1 tablet by mouth once a day 5)  Metformin Hcl 1000 Mg Tabs (Metformin hcl) .[name].. 1  two times a day 6)  Potassium Chloride Crys Cr 20 Meq Tbcr (Potassium chloride crys cr) .... Take 1 tablet by mouth once daily 7)  Toprol Xl 50 Mg Tb24 (Metoprolol succinate) .... Take 1 tablet by mouth once a day 8)  Multivitamins Tabs (Multiple vitamin) .... Once daily 9)  Calcium 500 Mg Tabs (Calcium) .... Two times a day 10)  Tricor 145 Mg Tabs (Fenofibrate) .... Take 1 tablet by mouth once a day 11)  Adult Aspirin Ec Low Strength 81 Mg Tbec (Aspirin) .[name].. 1 once daily 12)  Lantus Solostar 100 Unit/ml Soln (Insulin glargine) .... 60 units every morning and 100 units at bedtime 13)  Humalog Kwikpen 100 Unit/ml Soln (Insulin lispro (human)) .... 30 units prior each meal three times a day; bs over 150 add 10 units; if bs over 250 add 15 units 14)  Accu-chek Compact Test Drum Strp (Glucose blood) .... Use four times a day 15)  Ketoconazole 2 % Crea (Ketoconazole) .... Apply as needed 16)  Bd U/f Short Pen Needle 31g X 8 Mm Misc (Insulin pen needle) .... Use qid and hs 17)  Crestor 10 Mg Tabs (Rosuvastatin calcium) .... One tablet twice weekly  Other Orders: Venipuncture (16109) TLB-A1C / Hgb A1C (Glycohemoglobin) (83036-A1C)  Patient Instructions: 1)  Please schedule a follow-up appointment in 3 months. 2)  Limit your Sodium (Salt). 3)  It is important that you exercise regularly at least 20 minutes 5 times a week. If you develop chest pain, have severe difficulty breathing, or feel very tired , stop exercising immediately and seek medical attention. 4)  You need  to lose weight. Consider a lower calorie diet and regular exercise.  5)  Check your blood sugars regularly. If your readings are usually above : or below 70 you should contact our office. 6)  It is important that your Diabetic A1c level is checked every 3 months. 7)  See your eye doctor yearly to check for diabetic eye damage.

## 2010-10-13 NOTE — Progress Notes (Signed)
Summary: Pt's Hx & list of physicians/Patient  Pt's Hx & list of physicians/Patient   Imported By: Sherian Rein 04/28/2010 13:27:36  _____________________________________________________________________  External Attachment:    Type:   Image     Comment:   External Document

## 2010-10-13 NOTE — Letter (Signed)
Summary: Cardiology-Dr. Viann Fish  Cardiology-Dr. Viann Fish   Imported By: Maryln Gottron 03/02/2010 10:32:36  _____________________________________________________________________  External Attachment:    Type:   Image     Comment:   External Document

## 2010-10-13 NOTE — Progress Notes (Signed)
Summary: questions- lab and med change  Phone Note Call from Patient   Caller: Patient Call For: Selena Batten Summary of Call: has questions about the letter you sent her. ph [phone] Initial call taken by: Raechel Ache, RN,  November 06, 2009 2:48 PM  [name]        spoke with pt  - discussed letter rec'd about lab and changes in medication - verbalized understanding.  Also ontained new hm # [id number]   cel  [phone] [id number]    KIK Follow-up by: Duard Brady LPN,  November 06, 2009 3:34 PM

## 2010-10-13 NOTE — Assessment & Plan Note (Signed)
Summary: 3 month rov/njr  And a chief  Vital Signs:  Patient profile:   65 year old female Weight:      189 pounds Temp:     98.5 degrees F oral BP sitting:   140 / 80  (right arm) Cuff size:   regular  Vitals Entered By: Duard Brady LPN (November 04, 2009 9:59 AM) CC: 3mos rov - FBS 170 , tonsils swelling ? no sore throat or pain.  Husband with shingles - she has questions Is Patient Diabetic? Yes   CC:  3mos rov - FBS 170  and tonsils swelling ? no sore throat or pain.  Husband with shingles - she has questions.  History of Present Illness: a 65 year old patient seen today for follow-up of her hypertension.  Type 2 diabetes.  She has two dyslipidemia.  She has done quite well.  Her glycemic control remains a challenge.  Her Lantus has been up titrated to 140 units at bedtime and she is on a mealtime regimen with sliding scale coverage of 30 to 40 units of Humalog.  She feels well.  Her last A1c was 9.  She denies any cardiopulmonary complaints.  Her only complaint is some mild sore throat.  Preventive Screening-Counseling & Management  Alcohol-Tobacco     Smoking Status: never  Allergies (verified): No Known Drug Allergies  Past History:  Past Medical History: Reviewed history from 12/06/2007 and no changes required. Allergic rhinitis Coronary artery disease Diabetes mellitus, type II Hypertension Hyperlipidemia elevated transaminases  Family History: Reviewed history from 04/03/2009 and no changes required. father died age 9, and live mother died age 58, complications  of diabetes, coronary artery disease 4 sisters, positive diabetes, coronary artery disease,   Social History: Reviewed history from 04/03/2009 and no changes required. Married Never Smoked 2 children  Review of Systems  The patient denies anorexia, fever, weight loss, weight gain, vision loss, decreased hearing, hoarseness, chest pain, syncope, dyspnea on exertion, peripheral edema,  prolonged cough, headaches, hemoptysis, abdominal pain, melena, hematochezia, severe indigestion/heartburn, hematuria, incontinence, genital sores, muscle weakness, suspicious skin lesions, transient blindness, difficulty walking, depression, unusual weight change, abnormal bleeding, enlarged lymph nodes, angioedema, and breast masses.    Physical Exam  General:  overweight-appearing.  140/80overweight-appearing.   Head:  Normocephalic and atraumatic without obvious abnormalities. No apparent alopecia or balding. Mouth:  Oral mucosa and oropharynx without lesions or exudates.  Teeth in good repair. Neck:  No deformities, masses, or tenderness noted. Lungs:  Normal respiratory effort, chest expands symmetrically. Lungs are clear to auscultation, no crackles or wheezes. Heart:  Normal rate and regular rhythm. S1 and S2 normal without gallop, murmur, click, rub or other extra sounds. Abdomen:  Bowel sounds positive,abdomen soft and non-tender without masses, organomegaly or hernias noted. Pulses:  R and L carotid,radial,femoral,dorsalis pedis and posterior tibial pulses are full and equal bilaterally Extremities:  No clubbing, cyanosis, edema, or deformity noted with normal full range of motion of all joints.     Impression & Recommendations:  Problem # 1:  HYPERTENSION (ICD-401.9)  Her updated medication list for this problem includes:    Chlorthalidone 25 Mg Tabs (Chlorthalidone) .[name]... 1 tablet by mouth once a day    Lisinopril 40 Mg Tabs (Lisinopril) .[name]... Take 1 tablet by mouth once a day    Toprol Xl 50 Mg Tb24 (Metoprolol succinate) .[name]... Take 1 tablet by mouth once a day  Her updated medication list for this problem includes:    Chlorthalidone 25 Mg Tabs (  Chlorthalidone) .[name]... 1 tablet by mouth once a day    Lisinopril 40 Mg Tabs (Lisinopril) .[name]... Take 1 tablet by mouth once a day    Toprol Xl 50 Mg Tb24 (Metoprolol succinate) .[name]... Take 1 tablet by mouth once a day  Problem # 2:   DIABETES MELLITUS, TYPE II (ICD-250.00)  Her updated medication list for this problem includes:    Lisinopril 40 Mg Tabs (Lisinopril) .[name]... Take 1 tablet by mouth once a day    Metformin Hcl 1000 Mg Tabs (Metformin hcl) .[name]... 1 two times a day    Adult Aspirin Ec Low Strength 81 Mg Tbec (Aspirin) .[name]... 1 once daily    Lantus Solostar 100 Unit/ml Soln (Insulin glargine) .[name]... 140  units at bedtime    Humalog Kwikpen 100 Unit/ml Soln (Insulin lispro (human)) .[name]... 230 units prior to each meal three times a day; if bs over 150, add 5 units; if bs over 250 add 10 units; at bedtime give 1/2 of your mealtime sliding scale dose 15,18,20   will consider endocrinology referral  Her updated medication list for this problem includes:    Lisinopril 40 Mg Tabs (Lisinopril) .[name]... Take 1 tablet by mouth once a day    Metformin Hcl 1000 Mg Tabs (Metformin hcl) .[name]... 1 two times a day    Adult Aspirin Ec Low Strength 81 Mg Tbec (Aspirin) .[name]... 1 once daily    Lantus Solostar 100 Unit/ml Soln (Insulin glargine) .[name]... 140  units at bedtime    Humalog Kwikpen 100 Unit/ml Soln (Insulin lispro (human)) .[name]... 230 units prior to each meal three times a day; if bs over 150, add 5 units; if bs over 250 add 10 units; at bedtime give 1/2 of your mealtime sliding scale dose 15,18,20  Problem # 3:  HYPERLIPIDEMIA (ICD-272.4)  Her updated medication list for this problem includes:    Tricor 145 Mg Tabs (Fenofibrate) .[name]... Take 1 tablet by mouth once a day  Her updated medication list for this problem includes:    Tricor 145 Mg Tabs (Fenofibrate) .[name]... Take 1 tablet by mouth once a day  Complete Medication List: 1)  Astelin 137 Mcg/spray Soln (Azelastine hcl) .... Spray 2 spray into both nostrils twice a day 2)  Chlorthalidone 25 Mg Tabs (Chlorthalidone) .[name].. 1 tablet by mouth once a day 3)  Fexofenadine Hcl 180 Mg Tabs (Fexofenadine hcl) .[name].. 1 once daily 4)  Lisinopril 40 Mg Tabs (Lisinopril) .... Take 1 tablet by  mouth once a day 5)  Metformin Hcl 1000 Mg Tabs (Metformin hcl) .[name].. 1 two times a day 6)  Potassium Chloride Crys Cr 20 Meq Tbcr (Potassium chloride crys cr) .... Take 1 tablet by mouth once daily 7)  Toprol Xl 50 Mg Tb24 (Metoprolol succinate) .... Take 1 tablet by mouth once a day 8)  Multivitamins Tabs (Multiple vitamin) .... Once daily 9)  Calcium 500 Mg Tabs (Calcium) .... Two times a day 10)  Tricor 145 Mg Tabs (Fenofibrate) .... Take 1 tablet by mouth once a day 11)  Adult Aspirin Ec Low Strength 81 Mg Tbec (Aspirin) .[name].. 1 once daily 12)  Lantus Solostar 100 Unit/ml Soln (Insulin glargine) .Marland KitchenMarland KitchenMarland Kitchen 140  units at bedtime 13)  Humalog Kwikpen 100 Unit/ml Soln (Insulin lispro (human)) .... 230 units prior to each meal three times a day; if bs over 150, add 5 units; if bs over 250 add 10 units; at bedtime give 1/2 of your mealtime sliding scale dose 15,18,20 14)  Accu-chek  Compact Test Drum Strp (Glucose blood) .... Use four times a day 15)  Ketoconazole 2 % Crea (Ketoconazole) .... Apply as needed 16)  Bd U/f Short Pen Needle 31g X 8 Mm Misc (Insulin pen needle) .... Use qid and hs  Other Orders: Venipuncture (57322) TLB-A1C / Hgb A1C (Glycohemoglobin) (83036-A1C)  Patient Instructions: 1)  Please schedule a follow-up appointment in 3 months. 2)  Limit your Sodium (Salt). 3)  It is important that you exercise regularly at least 20 minutes 5 times a week. If you develop chest pain, have severe difficulty breathing, or feel very tired , stop exercising immediately and seek medical attention. 4)  You need to lose weight. Consider a lower calorie diet and regular exercise.  5)  Check your blood sugars regularly. If your readings are usually above : or below 70 you should contact our office. 6)  It is important that your Diabetic A1c level is checked every 3 months. 7)  See your eye doctor yearly to check for diabetic eye damage.  Appended Document: 3 month rov/njr     Allergies: No  Known Drug Allergies   Complete Medication List: 1)  Astelin 137 Mcg/spray Soln (Azelastine hcl) .... Spray 2 spray into both nostrils twice a day 2)  Chlorthalidone 25 Mg Tabs (Chlorthalidone) .[name].. 1 tablet by mouth once a day 3)  Fexofenadine Hcl 180 Mg Tabs (Fexofenadine hcl) .[name].. 1 once daily 4)  Lisinopril 40 Mg Tabs (Lisinopril) .... Take 1 tablet by mouth once a day 5)  Metformin Hcl 1000 Mg Tabs (Metformin hcl) .[name].. 1 two times a day 6)  Potassium Chloride Crys Cr 20 Meq Tbcr (Potassium chloride crys cr) .... Take 1 tablet by mouth once daily 7)  Toprol Xl 50 Mg Tb24 (Metoprolol succinate) .... Take 1 tablet by mouth once a day 8)  Multivitamins Tabs (Multiple vitamin) .... Once daily 9)  Calcium 500 Mg Tabs (Calcium) .... Two times a day 10)  Tricor 145 Mg Tabs (Fenofibrate) .... Take 1 tablet by mouth once a day 11)  Adult Aspirin Ec Low Strength 81 Mg Tbec (Aspirin) .[name].. 1 once daily 12)  Lantus Solostar 100 Unit/ml Soln (Insulin glargine) .Marland KitchenMarland KitchenMarland Kitchen 140  units at bedtime 13)  Humalog Kwikpen 100 Unit/ml Soln (Insulin lispro (human)) .... 230 units prior to each meal three times a day; if bs over 150, add 5 units; if bs over 250 add 10 units; at bedtime give 1/2 of your mealtime sliding scale dose 15,18,20 14)  Accu-chek Compact Test Drum Strp (Glucose blood) .... Use four times a day 15)  Ketoconazole 2 % Crea (Ketoconazole) .... Apply as needed 16)  Bd U/f Short Pen Needle 31g X 8 Mm Misc (Insulin pen needle) .... Use qid and hs  Other Orders: Zoster (Shingles) Vaccine Live [id number]) Admin 1st Vaccine (70623)    Immunizations Administered:  Zostavax # 0:    Vaccine Type: Zostavax    Site: l sq    Mfr: Merck    Dose: 0.65    Route: Maurice    Given by: Duard Brady LPN    Exp. Date: 10/01/2010    Lot #: [id number]    VIS given: 06/25/05 given November 04, 2009.  Appended Document: 3 month rov/njr spoke with pt - informed of labs , she needs to watch her diet better , exercise  daily , continue meds as ordered - she tells me she is only taking metformin once daily - this was told to her at visit  in feb -r/t diarrhea - . I told her I will discuss with Dr. Frederica Kuster and let her know. KIK  To Dr. Amador Cunas to advise Metformin once daily or two times a day ?

## 2010-10-13 NOTE — Progress Notes (Signed)
Summary: insulin quanity /[facility]  Phone Note Call from Patient   Caller: Patient Call For: Laurie Savers  MD Reason for Call: Refill Medication Summary of Call: need to talk with you r/t insulin refills needed from [facility] - running short Initial call taken by: Duard Brady LPN,  December 01, 2009 5:18 PM  [name]        called pt - with adjustments in insulins she does not have enough to last more than 3 days - [facility] wont disp unless new rx with quanity adjustments done.  Follow-up by: Duard Brady LPN,  December 01, 2009 5:19 PM    Additional [name] Additional follow up Details #2::    called [facility] , gave new rx for lantus and humalog with corrected quanities . If pt wants to expidite meds it will be $15 extra to get in 3-5 days vs 8-10 days. Ref # [id number] Follow-up by: Duard Brady LPN,  December 01, 2009 5:21 PM  Additional [name] Additional follow up Details #3:: Details for Additional Follow-up Action Taken: spoke with pt informed of quanity change - lantus 10 boxes - humalog - 6 boxes , gave ref. # instructed to call in AM to [facility] to discuss deliver options.  Verbalized understanding - changes to med list rx med. KIK Additional Follow-up by: Duard Brady LPN,  December 01, 2009 5:22 PM  New/Updated Medications: HUMALOG KWIKPEN 100 UNIT/ML  SOLN (INSULIN LISPRO (HUMAN)) 30 units prior each meal three times a day; BS over 150 add 10 units; if BS over 250 add 15 units Prescriptions: HUMALOG KWIKPEN 100 UNIT/ML  SOLN (INSULIN LISPRO (HUMAN)) 30 units prior each meal three times a day; BS over 150 add 10 units; if BS over 250 add 15 units  #6 boxes x 3   Entered by:   Duard Brady LPN   Authorized by:   Laurie Savers  MD   Signed by:   Duard Brady LPN on 30/86/5784   Method used:   Historical   RxID:   [id number] LANTUS SOLOSTAR 100 UNIT/ML  SOLN (INSULIN GLARGINE) 60 units every  morning and 100 units at bedtime  #10 boxes x 3   Entered by:   Duard Brady LPN   Authorized by:   Laurie Savers  MD   Signed by:   Duard Brady LPN on 02/72/5366   Method used:   Historical   RxID:   [id number]

## 2010-10-13 NOTE — Progress Notes (Signed)
Summary: short term humalog rx  Phone Note Call from Patient Call back at Home Phone [phone] Call back at [phone]   Caller: Patient Reason for Call: Talk to Nurse Summary of Call: Need actual script for Humalog for a 21 day supply.  The mail order is not going to arrive until 4/28 so she needs to get it filled locally until the other can be shipped.  Please call her back. Initial call taken by: Everrett Coombe,  December 03, 2009 1:58 PM    Prescriptions: HUMALOG KWIKPEN 100 UNIT/ML  SOLN (INSULIN LISPRO (HUMAN)) 30 units prior each meal three times a day; BS over 150 add 10 units; if BS over 250 add 15 units  #6 boes x 4   Entered by:   Duard Brady LPN   Authorized by:   Gordy Savers  MD   Signed by:   Duard Brady LPN on 47/82/9562   Method used:   Print then Give to Patient   RxID:   [id number] HUMALOG KWIKPEN 100 UNIT/ML  SOLN (INSULIN LISPRO (HUMAN)) 30 units prior each meal three times a day; BS over 150 add 10 units; if BS over 250 add 15 units  #21 days x 2   Entered by:   Duard Brady LPN   Authorized by:   Gordy Savers  MD   Signed by:   Duard Brady LPN on 84/13/2440   Method used:   Print then Give to Patient   RxID:   [id number]  pt aware rx ready for pick up .  KIK

## 2010-10-13 NOTE — Assessment & Plan Note (Signed)
Summary: 2-3 WK FU---STC   Vital Signs:  Patient profile:   65 year old female Height:      63 inches (160.02 cm) Weight:      191.50 pounds (87.05 kg) BMI:     34.05 O2 Sat:      95 % on Room air Temp:     97.9 degrees F (36.61 degrees C) oral Pulse rate:   84 / minute BP sitting:   122 / 64  (left arm) Cuff size:   regular  Vitals Entered By: Brenton Grills MA (May 14, 2010 2:36 PM)  O2 Flow:  Room air CC: 3 week F/U appt/aj Is Patient Diabetic? Yes   CC:  3 week F/U appt/aj.  History of Present Illness: pt was noted at last ov to have a goiter.   she does not notice.   she brings a record of her cbg's which i have reviewed today.  it is improved.  it is highest at hs (250-400), and lower at other times (140-250).     Current Medications (verified): 1)  Astelin 137 Mcg/spray Soln (Azelastine Hcl) .... Spray 2 Spray Into Both Nostrils Twice A Day 2)  Chlorthalidone 25 Mg Tabs (Chlorthalidone) .[name].. 1 Tablet By Mouth Once A Day 3)  Fexofenadine Hcl 180 Mg Tabs (Fexofenadine Hcl) .[name].. 1 Once Daily 4)  Lisinopril 40 Mg Tabs (Lisinopril) .... Take 1 Tablet By Mouth Once A Day 5)  Metformin Hcl 1000 Mg Tabs (Metformin Hcl) .[name].. 1 Two Times A Day 6)  Potassium Chloride Crys Cr 20 Meq Tbcr (Potassium Chloride Crys Cr) .... Take 1 Tablet By Mouth Once Daily 7)  Toprol Xl 50 Mg Tb24 (Metoprolol Succinate) .... Take 1 Tablet By Mouth Once A Day 8)  Multivitamins   Tabs (Multiple Vitamin) .... Once Daily 9)  Calcium 500 Mg  Tabs (Calcium) .... Two Times A Day 10)  Tricor 145 Mg  Tabs (Fenofibrate) .... Take 1 Tablet By Mouth Once A Day 11)  Adult Aspirin Ec Low Strength 81 Mg  Tbec (Aspirin) .[name].. 1 Once Daily 12)  Lantus Solostar 100 Unit/ml  Soln (Insulin Glargine) .... 65 Units Two Times A Day 13)  Humalog Kwikpen 100 Unit/ml  Soln (Insulin Lispro (Human)) .... 50 Units Three Times A Day (Just Before Each Meal) 14)  Accu-Chek Compact Test Drum   Strp (Glucose Blood) .... Use  Four Times A Day 15)  Ketoconazole 2 % Crea (Ketoconazole) .... Apply As Needed 16)  Bd U/f Short Pen Needle 31g X 8 Mm Misc (Insulin Pen Needle) .... Use Qid and Hs 17)  Crestor 10 Mg Tabs (Rosuvastatin Calcium) .... One Tablet Twice Weekly  Allergies (verified): No Known Drug Allergies  Past History:  Past Medical History: Last updated: 01/05/2010 Allergic rhinitis Coronary artery disease Diabetes mellitus, type II Hypertension Hyperlipidemia elevated transaminases Bell's palsy, April 2011  Family History: Reviewed history from 04/24/2010 and no changes required. father died age 61, and live mother died age 45, complications  of diabetes, coronary artery disease 4 sisters, positive diabetes, coronary artery disease,  dm: father, 2 sibs, 2 children. no goiter or other thyroid probs.    Review of Systems  The patient denies hypoglycemia.    Physical Exam  General:  normal appearance.   Psych:  Alert and cooperative; normal mood and affect; normal attention span and concentration.   Additional Exam:   FastTSH  1.10 uIU/mL     Impression & Recommendations:  Problem # 1:  GOITER, MULTINODULAR (ICD-241.1)  Problem # 2:  DIABETES MELLITUS, TYPE II (ICD-250.00) she needs some adjustment in her therapy  Medications Added to Medication List This Visit: 1)  Lantus Solostar 100 Unit/ml Soln (Insulin glargine) .... 50 units two times a day 2)  Humalog Kwikpen 100 Unit/ml Soln (Insulin lispro (human)) .... 50 units three times a day (just before each meal) 3)  Humalog Kwikpen 100 Unit/ml Soln (Insulin lispro (human)) .... Three times a day (just before each meal) 60-60-80 units  Other Orders: TLB-TSH (Thyroid Stimulating Hormone) (84443-TSH) Est. Patient Level III (09811)  Patient Instructions: 1)  check your blood sugar 4 times a day--before the 3 meals, and at bedtime.  also check if you have symptoms of your blood sugar being too high or too low.  please  keep a record of the readings and bring it to your next appointment here.  please call us sooner if you are having low blood sugar episodes. 2)  we will need to take this complex situation in stages. 3)  reduce lantus to 50 units two times a day, and: 4)  increase humalog to three times a day (just before each meal), 60-60-80 units. 5)  a blood test are being ordered for you today.  please call [phone] to hear your test results.  if it is normal, next step will be a biopsy of the largest nodule.   6)  Please schedule a follow-up appointment in 1-2 weeks. 7)  (update: i left message on phone-tree:  rx as we discussed)

## 2010-10-13 NOTE — Assessment & Plan Note (Signed)
Summary: 3 month rov/njr---PT [facility] // RS   Vital Signs:  Patient profile:   65 year old female Weight:      192 pounds Temp:     98.3 degrees F oral BP sitting:   130 / 80  (right arm) Cuff size:   regular  Vitals Entered By: Duard Brady LPN (June 30, 2010 9:57 AM) CC: 3 mos rov - doing ok    fbs 172      Is Patient Diabetic? Yes Did you bring your meter with you today? No   CC:  3 mos rov - doing ok    fbs 172     .  History of Present Illness: 85 -year-old who is seen today for follow-up of her type 2 diabetes.  She has been referred to endocrine and has a follow up visit next month.  She is on multiple daily injections and her insulin dose has been up titrated.  She has treated hypertension, coronary artery disease, as well as dyslipidemia.  Medical regimen includes Crestor 10 mg daily, which he is tolerating well.  Denies any cardiac symptoms.  She feels well today.  Denies any hypoglycemic symptoms.  Fasting blood sugar earlier today, 172.  She declines a flu vaccine.  Is also followed by cardiology. The patient was noted by endocrinology to have mild thyromegaly and is status post thyroid biopsy with benign pathology  Allergies (verified): No Known Drug Allergies  Past History:  Past Medical History: Allergic rhinitis Coronary artery disease Diabetes mellitus, type II Hypertension Hyperlipidemia elevated transaminases Bell's palsy, April 2011 mild thyromegaly  Past Surgical History: Coronary artery bypass graft 2000 Tubal ligation Cardiolite stress test July 2007, 04/2009 flexible sigmoidoscopy 2003 percutaneous thyroid, biopsy September 2011  Family History: Reviewed history from 05/14/2010 and no changes required. father died age 45, and live mother died age 27, complications  of diabetes, coronary artery disease 4 sisters, positive diabetes, coronary artery disease,  dm: father, 2 sibs, 2 children. no goiter or other thyroid probs.    Social  History: Reviewed history from 04/24/2010 and no changes required. Married Never Smoked 2 children does not work outside the home  Review of Systems  The patient denies anorexia, fever, weight loss, weight gain, vision loss, decreased hearing, hoarseness, chest pain, syncope, dyspnea on exertion, peripheral edema, prolonged cough, headaches, hemoptysis, abdominal pain, melena, hematochezia, severe indigestion/heartburn, hematuria, incontinence, genital sores, muscle weakness, suspicious skin lesions, transient blindness, difficulty walking, depression, unusual weight change, abnormal bleeding, enlarged lymph nodes, angioedema, and breast masses.    Physical Exam  General:  overweight-appearing.  130/80overweight-appearing.   Head:  Normocephalic and atraumatic without obvious abnormalities. No apparent alopecia or balding. Eyes:  No corneal or conjunctival inflammation noted. EOMI. Perrla. Funduscopic exam benign, without hemorrhages, exudates or papilledema. Vision grossly normal. Mouth:  Oral mucosa and oropharynx without lesions or exudates.  Teeth in good repair. Neck:  No deformities, masses, or tenderness noted. minimal symmetrical, thyroid enlargement Chest Wall:  No deformities, masses, or tenderness noted. status post sternotomy Lungs:  Normal respiratory effort, chest expands symmetrically. Lungs are clear to auscultation, no crackles or wheezes. Heart:  Normal rate and regular rhythm. S1 and S2 normal without gallop, murmur, click, rub or other extra sounds. Abdomen:  Bowel sounds positive,abdomen soft and non-tender without masses, organomegaly or hernias noted. Msk:  No deformity or scoliosis noted of thoracic or lumbar spine.   Pulses:  pedal pulses are full Extremities:  No clubbing, cyanosis, edema, or deformity noted  with normal full range of motion of all joints.    Diabetes Management Exam:    Foot Exam (with socks and/or shoes not present):        Sensory-Pinprick/Light touch:          Left medial foot (L-4): normal          Left dorsal foot (L-5): normal          Left lateral foot (S-1): normal          Right medial foot (L-4): normal          Right dorsal foot (L-5): normal          Right lateral foot (S-1): normal       Sensory-Monofilament:          Left foot: normal          Right foot: normal       Inspection:          Left foot: normal          Right foot: normal       Nails:          Left foot: normal          Right foot: normal    Foot Exam by Podiatrist:       Date: 06/30/2010       Results: no diabetic findings       Done by: PCP   Impression & Recommendations:  Problem # 1:  HYPERLIPIDEMIA (ICD-272.4)  Her updated medication list for this problem includes:    Tricor 145 Mg Tabs (Fenofibrate) .[name]... Take 1 tablet by mouth once a day    Crestor 10 Mg Tabs (Rosuvastatin calcium) ..... One tablet twice weekly continues to tolerate Crestor well   Her updated medication list for this problem includes:    Tricor 145 Mg Tabs (Fenofibrate) .[name]... Take 1 tablet by mouth once a day    Crestor 10 Mg Tabs (Rosuvastatin calcium) ..... One tablet twice weekly  Problem # 2:  HYPERTENSION (ICD-401.9)  Her updated medication list for this problem includes:    Chlorthalidone 25 Mg Tabs (Chlorthalidone) .[name]... 1 tablet by mouth once a day    Lisinopril 40 Mg Tabs (Lisinopril) .[name]... Take 1 tablet by mouth once a day    Toprol Xl 50 Mg Tb24 (Metoprolol succinate) .[name]... Take 1 tablet by mouth once a day  Her updated medication list for this problem includes:    Chlorthalidone 25 Mg Tabs (Chlorthalidone) .[name]... 1 tablet by mouth once a day    Lisinopril 40 Mg Tabs (Lisinopril) .[name]... Take 1 tablet by mouth once a day    Toprol Xl 50 Mg Tb24 (Metoprolol succinate) .[name]... Take 1 tablet by mouth once a day  Problem # 3:  DIABETES MELLITUS, TYPE II (ICD-250.00)  Her updated medication list for this problem includes:    Lisinopril  40 Mg Tabs (Lisinopril) .[name]... Take 1 tablet by mouth once a day    Adult Aspirin Ec Low Strength 81 Mg Tbec (Aspirin) .[name]... 1 once daily    Lantus Solostar 100 Unit/ml Soln (Insulin glargine) .Marland KitchenMarland KitchenMarland KitchenMarland Kitchen 70 units at bedtime    Humalog Kwikpen 100 Unit/ml Soln (Insulin lispro (human)) .[name]... Three times a day (just before each meal) 70-90-120 units  Her updated medication list for this problem includes:    Lisinopril 40 Mg Tabs (Lisinopril) .[name]... Take 1 tablet by mouth once a day    Adult Aspirin Ec Low Strength 81 Mg Tbec (  Aspirin) .[name]... 1 once daily    Lantus Solostar 100 Unit/ml Soln (Insulin glargine) .Marland KitchenMarland KitchenMarland KitchenMarland Kitchen 70 units at bedtime    Humalog Kwikpen 100 Unit/ml Soln (Insulin lispro (human)) .[name]... Three times a day (just before each meal) 70-90-120 units  Problem # 4:  CORONARY ARTERY DISEASE (ICD-414.00)  Her updated medication list for this problem includes:    Chlorthalidone 25 Mg Tabs (Chlorthalidone) .[name]... 1 tablet by mouth once a day    Lisinopril 40 Mg Tabs (Lisinopril) .[name]... Take 1 tablet by mouth once a day    Toprol Xl 50 Mg Tb24 (Metoprolol succinate) .[name]... Take 1 tablet by mouth once a day    Adult Aspirin Ec Low Strength 81 Mg Tbec (Aspirin) .[name]... 1 once daily  Her updated medication list for this problem includes:    Chlorthalidone 25 Mg Tabs (Chlorthalidone) .[name]... 1 tablet by mouth once a day    Lisinopril 40 Mg Tabs (Lisinopril) .[name]... Take 1 tablet by mouth once a day    Toprol Xl 50 Mg Tb24 (Metoprolol succinate) .[name]... Take 1 tablet by mouth once a day    Adult Aspirin Ec Low Strength 81 Mg Tbec (Aspirin) .[name]... 1 once daily  Complete Medication List: 1)  Astelin 137 Mcg/spray Soln (Azelastine hcl) .... Spray 2 spray into both nostrils twice a day 2)  Chlorthalidone 25 Mg Tabs (Chlorthalidone) .[name].. 1 tablet by mouth once a day 3)  Fexofenadine Hcl 180 Mg Tabs (Fexofenadine hcl) .[name].. 1 once daily 4)  Lisinopril 40 Mg Tabs (Lisinopril) .... Take 1 tablet by mouth once a day 5)   Potassium Chloride Crys Cr 20 Meq Tbcr (Potassium chloride crys cr) .... Take 1 tablet by mouth once daily 6)  Toprol Xl 50 Mg Tb24 (Metoprolol succinate) .... Take 1 tablet by mouth once a day 7)  Multivitamins Tabs (Multiple vitamin) .... Once daily 8)  Calcium 500 Mg Tabs (Calcium) .... Two times a day 9)  Tricor 145 Mg Tabs (Fenofibrate) .... Take 1 tablet by mouth once a day 10)  Adult Aspirin Ec Low Strength 81 Mg Tbec (Aspirin) .[name].. 1 once daily 11)  Lantus Solostar 100 Unit/ml Soln (Insulin glargine) .... 70 units at bedtime 12)  Humalog Kwikpen 100 Unit/ml Soln (Insulin lispro (human)) .... Three times a day (just before each meal) 70-90-120 units 13)  Accu-chek Compact Test Drum Strp (Glucose blood) .... Use four times a day 14)  Ketoconazole 2 % Crea (Ketoconazole) .... Apply as needed 15)  Bd U/f Short Pen Needle 31g X 8 Mm Misc (Insulin pen needle) .... Use qid and hs 16)  Crestor 10 Mg Tabs (Rosuvastatin calcium) .... One tablet twice weekly  Patient Instructions: 1)  Limit your Sodium (Salt). 2)  It is important that you exercise regularly at least 20 minutes 5 times a week. If you develop chest pain, have severe difficulty breathing, or feel very tired , stop exercising immediately and seek medical attention. 3)  You need to lose weight. Consider a lower calorie diet and regular exercise.  4)  Take calcium +Vitamin D daily. 5)  Check your blood sugars regularly. If your readings are usually above : or below 70 you should contact our office. 6)  It is important that your Diabetic A1c level is checked every 3 months. 7)  See your eye doctor yearly to check for diabetic eye damage. 8)  Please schedule a follow-up appointment in 6 months.   Orders Added: 1)  Est. Patient Level IV [09811]

## 2010-10-13 NOTE — Progress Notes (Signed)
Summary: f/u labs  Phone Note Call from Patient Call back at Home Phone [phone]   Caller: Patient Call For: Gordy Savers  MD Summary of Call: pt. wants a return call from Kim//about the directions from Dr. Kirtland Bouchard  from the test she had done yesterday. Initial call taken by: Georgian Co,  April 07, 2010 12:35 PM  [name]        spoke with pt about labs - will be making appt with Dr. Everardo All for here . Terri or Bjorn Loser will call once they have it set up. If she has not here from them by friday - call me back. KIK Follow-up by: Duard Brady LPN,  April 07, 2010 12:55 PM

## 2010-10-13 NOTE — Assessment & Plan Note (Signed)
Summary: belles palsy/dm   Vital Signs:  Patient profile:   65 year old female Weight:      192 pounds Temp:     98.1 degrees F oral BP sitting:   120 / 70  (left arm) Cuff size:   regular  Vitals Entered By: Duard Brady LPN (January 05, 2010 1:03 PM) CC: c/o (L) facial drooping noted last week , unsteady gait     [id number] Is Patient Diabetic? Yes Did you bring your meter with you today? No   CC:  c/o (L) facial drooping noted last week  and unsteady gait     [id number].  History of Present Illness: 65 year old patient who is seen today for follow-up.  She has a history of coronary artery disease, type 2 diabetes, allergic rhinitis, and hypertension.  She was seen by her eye doctor in one week ago and noted to have left-sided Bell's palsy at  that time.  She has been symptomatic for well over one week.  Her diabetes has been stable.  Her blood pressure has been stable.  She denies any cardiopulmonary complaints.  She has a history of dyslipidemia, controlled on TriCor  Preventive Screening-Counseling & Management  Alcohol-Tobacco     Smoking Status: never  Allergies (verified): No Known Drug Allergies  Past History:  Past Medical History: Allergic rhinitis Coronary artery disease Diabetes mellitus, type II Hypertension Hyperlipidemia elevated transaminases Bell's palsy, April 2011  Family History: Reviewed history from 04/03/2009 and no changes required. father died age 76, and live mother died age 63, complications  of diabetes, coronary artery disease 4 sisters, positive diabetes, coronary artery disease,   Review of Systems  The patient denies anorexia, fever, weight loss, weight gain, vision loss, decreased hearing, hoarseness, chest pain, syncope, dyspnea on exertion, peripheral edema, prolonged cough, headaches, hemoptysis, abdominal pain, melena, hematochezia, severe indigestion/heartburn, hematuria, incontinence, genital sores, muscle weakness, suspicious skin  lesions, transient blindness, difficulty walking, depression, unusual weight change, abnormal bleeding, enlarged lymph nodes, angioedema, and breast masses.    Physical Exam  General:  overweight-appearing.  120/70overweight-appearing.   Head:  Normocephalic and atraumatic without obvious abnormalities. No apparent alopecia or balding. Eyes:  No corneal or conjunctival inflammation noted. EOMI. Perrla. Funduscopic exam benign, without hemorrhages, exudates or papilledema. Vision grossly normal. Mouth:  Oral mucosa and oropharynx without lesions or exudates.  Teeth in good repair. Neck:  No deformities, masses, or tenderness noted. Lungs:  Normal respiratory effort, chest expands symmetrically. Lungs are clear to auscultation, no crackles or wheezes. Heart:  Normal rate and regular rhythm. S1 and S2 normal without gallop, murmur, click, rub or other extra sounds. Abdomen:  Bowel sounds positive,abdomen soft and non-tender without masses, organomegaly or hernias noted. Msk:  No deformity or scoliosis noted of thoracic or lumbar spine.   Pulses:  R and L carotid,radial,femoral,dorsalis pedis and posterior tibial pulses are full and equal bilaterally Neurologic:  left peripheral seventh palsy   Impression & Recommendations:  Problem # 1:  BELL'S PALSY (ICD-351.0)  Problem # 2:  HYPERTENSION (ICD-401.9)  Her updated medication list for this problem includes:    Chlorthalidone 25 Mg Tabs (Chlorthalidone) .[name]... 1 tablet by mouth once a day    Lisinopril 40 Mg Tabs (Lisinopril) .[name]... Take 1 tablet by mouth once a day    Toprol Xl 50 Mg Tb24 (Metoprolol succinate) .[name]... Take 1 tablet by mouth once a day  Her updated medication list for this problem includes:    Chlorthalidone 25 Mg Tabs (Chlorthalidone) .Marland KitchenMarland KitchenMarland KitchenMarland Kitchen  1 tablet by mouth once a day    Lisinopril 40 Mg Tabs (Lisinopril) .[name]... Take 1 tablet by mouth once a day    Toprol Xl 50 Mg Tb24 (Metoprolol succinate) .[name]... Take 1 tablet by mouth once  a day  Problem # 3:  DIABETES MELLITUS, TYPE II (ICD-250.00)  Her updated medication list for this problem includes:    Lisinopril 40 Mg Tabs (Lisinopril) .[name]... Take 1 tablet by mouth once a day    Metformin Hcl 1000 Mg Tabs (Metformin hcl) .[name]... 1 two times a day    Adult Aspirin Ec Low Strength 81 Mg Tbec (Aspirin) .[name]... 1 once daily    Lantus Solostar 100 Unit/ml Soln (Insulin glargine) .Marland KitchenMarland KitchenMarland KitchenMarland Kitchen 60 units every morning and 100 units at bedtime    Humalog Kwikpen 100 Unit/ml Soln (Insulin lispro (human)) .Marland KitchenMarland KitchenMarland KitchenMarland Kitchen 30 units prior each meal three times a day; bs over 150 add 10 units; if bs over 250 add 15 units  Her updated medication list for this problem includes:    Lisinopril 40 Mg Tabs (Lisinopril) .[name]... Take 1 tablet by mouth once a day    Metformin Hcl 1000 Mg Tabs (Metformin hcl) .[name]... 1 two times a day    Adult Aspirin Ec Low Strength 81 Mg Tbec (Aspirin) .[name]... 1 once daily    Lantus Solostar 100 Unit/ml Soln (Insulin glargine) .Marland KitchenMarland KitchenMarland KitchenMarland Kitchen 60 units every morning and 100 units at bedtime    Humalog Kwikpen 100 Unit/ml Soln (Insulin lispro (human)) .Marland KitchenMarland KitchenMarland KitchenMarland Kitchen 30 units prior each meal three times a day; bs over 150 add 10 units; if bs over 250 add 15 units  Problem # 4:  CORONARY ARTERY DISEASE (ICD-414.00)  Her updated medication list for this problem includes:    Chlorthalidone 25 Mg Tabs (Chlorthalidone) .[name]... 1 tablet by mouth once a day    Lisinopril 40 Mg Tabs (Lisinopril) .[name]... Take 1 tablet by mouth once a day    Toprol Xl 50 Mg Tb24 (Metoprolol succinate) .[name]... Take 1 tablet by mouth once a day    Adult Aspirin Ec Low Strength 81 Mg Tbec (Aspirin) .[name]... 1 once daily  Her updated medication list for this problem includes:    Chlorthalidone 25 Mg Tabs (Chlorthalidone) .[name]... 1 tablet by mouth once a day    Lisinopril 40 Mg Tabs (Lisinopril) .[name]... Take 1 tablet by mouth once a day    Toprol Xl 50 Mg Tb24 (Metoprolol succinate) .[name]... Take 1 tablet by mouth once a day    Adult Aspirin Ec Low  Strength 81 Mg Tbec (Aspirin) .[name]... 1 once daily  Complete Medication List: 1)  Astelin 137 Mcg/spray Soln (Azelastine hcl) .... Spray 2 spray into both nostrils twice a day 2)  Chlorthalidone 25 Mg Tabs (Chlorthalidone) .[name].. 1 tablet by mouth once a day 3)  Fexofenadine Hcl 180 Mg Tabs (Fexofenadine hcl) .[name].. 1 once daily 4)  Lisinopril 40 Mg Tabs (Lisinopril) .... Take 1 tablet by mouth once a day 5)  Metformin Hcl 1000 Mg Tabs (Metformin hcl) .[name].. 1 two times a day 6)  Potassium Chloride Crys Cr 20 Meq Tbcr (Potassium chloride crys cr) .... Take 1 tablet by mouth once daily 7)  Toprol Xl 50 Mg Tb24 (Metoprolol succinate) .... Take 1 tablet by mouth once a day 8)  Multivitamins Tabs (Multiple vitamin) .... Once daily 9)  Calcium 500 Mg Tabs (Calcium) .... Two times a day 10)  Tricor 145 Mg Tabs (Fenofibrate) .... Take 1 tablet by mouth once a day 11)  Adult Aspirin Ec Low Strength 81 Mg Tbec (Aspirin) .[name].. 1 once daily 12)  Lantus Solostar 100 Unit/ml Soln (Insulin glargine) .... 60 units every morning and 100 units at bedtime 13)  Humalog Kwikpen 100 Unit/ml Soln (Insulin lispro (human)) .... 30 units prior each meal three times a day; bs over 150 add 10 units; if bs over 250 add 15 units 14)  Accu-chek Compact Test Drum Strp (Glucose blood) .... Use four times a day 15)  Ketoconazole 2 % Crea (Ketoconazole) .... Apply as needed 16)  Bd U/f Short Pen Needle 31g X 8 Mm Misc (Insulin pen needle) .... Use qid and hs  Other Orders: Venipuncture ([address]) TLB-A1C / Hgb A1C (Glycohemoglobin) (83036-A1C)  Patient Instructions: 1)  Please schedule a follow-up appointment in 3 months. 2)  Limit your Sodium (Salt). 3)  It is important that you exercise regularly at least 20 minutes 5 times a week. If you develop chest pain, have severe difficulty breathing, or feel very tired , stop exercising immediately and seek medical attention. 4)  You need to lose weight. Consider a lower calorie diet and  regular exercise.  5)  Check your blood sugars regularly. If your readings are usually above : or below 70 you should contact our office. 6)  It is important that your Diabetic A1c level is checked every 3 months.

## 2010-10-15 NOTE — Progress Notes (Signed)
  Phone Note Call from Patient Call back at Home Phone [phone]   Caller: Patient Call For: Laurie Savers  MD Summary of Call: Pt is having vomiting, diarrhea and abdominal pain x several hours.  Wants RX for nausea and diarrhea.  BS:   [address] [facility]   Initial call taken by: Lynann Beaver CMA AAMA,  September 16, 2010 11:08 AM  [name]        generic lomotil  #30 one every 4 hrs for diarrhea; generic phenergan 25  #20 one every 4-6 hrs for nausea Follow-up by: Laurie Savers  MD,  September 16, 2010 12:29 PM     Appended Document:  Medications Added LOMOTIL 2.5-0.025 MG TABS (DIPHENOXYLATE-ATROPINE) one by mouth q 6 hours as needed diarrhea. PROMETHAZINE HCL 25 MG TABS (PROMETHAZINE HCL) one by mouth q 4-6 hours as needed for nausea          Prescriptions: PROMETHAZINE HCL 25 MG TABS (PROMETHAZINE HCL) one by mouth q 4-6 hours as needed for nausea  #20 x 0   Entered by:   Lynann Beaver CMA AAMA   Authorized by:   Laurie Savers  MD   Signed by:   Lynann Beaver CMA AAMA on 09/16/2010   Method used:   Telephoned to ...       [facility]  [phone]* (retail)       [address] [place], [place]  [address]       Ph: [phone] or [phone]       Fax: [phone]   RxID:   [id number] LOMOTIL 2.5-0.025 MG TABS (DIPHENOXYLATE-ATROPINE) one by mouth q 6 hours as needed diarrhea.  #30 x 0   Entered by:   Lynann Beaver CMA AAMA   Authorized by:   Laurie Savers  MD   Signed by:   Lynann Beaver CMA AAMA on 09/16/2010   Method used:   Telephoned to ...       [facility]  [phone]* (retail)       [address] [place], [place]  [address]       Ph: [phone] or [phone]       Fax: [phone]   RxID:   [id number]

## 2010-10-15 NOTE — Assessment & Plan Note (Signed)
Summary: 1 MO ROV /NWS  #   Vital Signs:  Patient profile:   65 year old female Height:      63 inches (160.02 cm) Weight:      194.13 pounds (88.24 kg) O2 Sat:      94 % on Room air Temp:     98.4 degrees F (36.89 degrees C) oral Pulse rate:   87 / minute Pulse rhythm:   regular BP sitting:   124 / 66  (left arm) Cuff size:   regular  Vitals Entered By: Brenton Grills CMA (AAMA) (August 28, 2010 10:00 AM)  O2 Flow:  Room air CC: 1 month F/U/aj Is Patient Diabetic? Yes   CC:  1 month F/U/aj.  History of Present Illness: she brings a record of her cbg's which i have reviewed today.  it varies from 110 (before lunch) to 250 (hs).  she still has fatigue.   Current Medications (verified): 1)  Astelin 137 Mcg/spray Soln (Azelastine Hcl) .... Spray 2 Spray Into Both Nostrils Twice A Day 2)  Chlorthalidone 25 Mg Tabs (Chlorthalidone) .[name].. 1 Tablet By Mouth Once A Day 3)  Fexofenadine Hcl 180 Mg Tabs (Fexofenadine Hcl) .[name].. 1 Once Daily 4)  Lisinopril 40 Mg Tabs (Lisinopril) .... Take 1 Tablet By Mouth Once A Day 5)  Potassium Chloride Crys Cr 20 Meq Tbcr (Potassium Chloride Crys Cr) .... Take 1 Tablet By Mouth Once Daily 6)  Toprol Xl 50 Mg Tb24 (Metoprolol Succinate) .... Take 1 Tablet By Mouth Once A Day 7)  Multivitamins   Tabs (Multiple Vitamin) .... Once Daily 8)  Calcium 500 Mg  Tabs (Calcium) .... Two Times A Day 9)  Tricor 145 Mg  Tabs (Fenofibrate) .... Take 1 Tablet By Mouth Once A Day 10)  Adult Aspirin Ec Low Strength 81 Mg  Tbec (Aspirin) .[name].. 1 Once Daily 11)  Lantus Solostar 100 Unit/ml  Soln (Insulin Glargine) .... 60 Units At Bedtime 12)  Humalog Kwikpen 100 Unit/ml  Soln (Insulin Lispro (Human)) .... Three Times A Day (Just Before Each Meal) 90-90-160 Units 13)  Accu-Chek Compact Test Drum   Strp (Glucose Blood) .... Use Four Times A Day 14)  Ketoconazole 2 % Crea (Ketoconazole) .... Apply As Needed 15)  Bd U/f Short Pen Needle 31g X 8 Mm Misc (Insulin Pen  Needle) .... Use Qid and Hs 16)  Crestor 10 Mg Tabs (Rosuvastatin Calcium) .... One Tablet Twice Weekly  Allergies (verified): No Known Drug Allergies  Past History:  Past Medical History: Last updated: 06/30/2010 Allergic rhinitis Coronary artery disease Diabetes mellitus, type II Hypertension Hyperlipidemia elevated transaminases Bell's palsy, April 2011 mild thyromegaly  Review of Systems  The patient denies hypoglycemia.    Physical Exam  General:  normal appearance.   Psych:  Alert and cooperative; normal mood and affect; normal attention span and concentration.   Additional Exam:  Fructosamine         [H]  330 umol/L  (converts to a1c of 7.2) Hemoglobin A1C       [H]  9.3 %    Impression & Recommendations:  Problem # 1:  DIABETES MELLITUS, TYPE II (ICD-250.00) Assessment Improved  Medications Added to Medication List This Visit: 1)  Humalog Kwikpen 100 Unit/ml Soln (Insulin lispro (human)) .... Three times a day (just before each meal) 90-90-180 units 2)  Humalog Kwikpen 100 Unit/ml Soln (Insulin lispro (human)) .... Three times a day (just before each meal) 90-100-200 units  Other Orders: T-Fructosamine ([address]-60454) TLB-A1C /  Hgb A1C (Glycohemoglobin) (83036-A1C) Est. Patient Level III (81191)  Patient Instructions: 1)  check your blood sugar 4 times a day--before the 3 meals, and at bedtime.  also check if you have symptoms of your blood sugar being too high or too low.  please keep a record of the readings and bring it to your next appointment here.  please call us sooner if you are having low blood sugar episodes.  tests are being ordered for you today.  please call [phone] to hear your test results. 2)  pending the test results, please continue lantus 60 units at bedtime, and: increase humalog to three times a day (just before each meal), 90-90-180 units. 3)  Please schedule a follow-up appointment in 1 month. 4)  (update: i left message on phone-tree:   rx as we discussed, except you can return 3 mos)   Orders Added: 1)  T-Fructosamine [[id number]] 2)  TLB-A1C / Hgb A1C (Glycohemoglobin) [83036-A1C] 3)  Est. Patient Level III [21308]   Immunization History:  Influenza Immunization History:    Influenza:  historical (07/14/2010)   Immunization History:  Influenza Immunization History:    Influenza:  Historical (07/14/2010)

## 2010-11-11 ENCOUNTER — Other Ambulatory Visit: Payer: Self-pay | Admitting: Internal Medicine

## 2010-11-20 ENCOUNTER — Other Ambulatory Visit: Payer: 59

## 2010-11-20 ENCOUNTER — Ambulatory Visit (INDEPENDENT_AMBULATORY_CARE_PROVIDER_SITE_OTHER): Payer: 59 | Admitting: Endocrinology

## 2010-11-20 ENCOUNTER — Encounter: Payer: Self-pay | Admitting: Endocrinology

## 2010-11-20 ENCOUNTER — Other Ambulatory Visit: Payer: Self-pay | Admitting: Endocrinology

## 2010-11-20 DIAGNOSIS — E119 Type 2 diabetes mellitus without complications: Secondary | ICD-10-CM

## 2010-11-20 LAB — HEMOGLOBIN A1C: Hgb A1c MFr Bld: 9.7 % — ABNORMAL HIGH (ref 4.6–6.5)

## 2010-11-20 LAB — CONVERTED CEMR LAB: Fructosamine: 342 micromoles/L — ABNORMAL HIGH (ref <285)

## 2010-11-24 NOTE — Assessment & Plan Note (Signed)
Summary: 3 month follow up- stc rs'd from jan per sae/ nws   Vital Signs:  Patient profile:   65 year old female Height:      63 inches (160.02 cm) Weight:      192.25 pounds (87.39 kg) BMI:     34.18 O2 Sat:      95 % on Room air Temp:     98.4 degrees F (36.89 degrees C) oral Pulse rate:   80 / minute Pulse rhythm:   regular BP sitting:   128 / 72  (left arm) Cuff size:   regular  Vitals Entered By: Brenton Grills CMA (AAMA) (November 20, 2010 10:02 AM)  O2 Flow:  Room air CC: 3 month F/U/aj Is Patient Diabetic? Yes   CC:  3 month F/U/aj.  History of Present Illness: pt states she feels well in general.  she brings a record of her cbg's which i have reviewed today. it was 90, once when she had viral ge.  otherwise, it varies from 150-350, with no trend thoughout the day.    Current Medications (verified): 1)  Astelin 137 Mcg/spray Soln (Azelastine Hcl) .... Spray 2 Spray Into Both Nostrils Twice A Day 2)  Chlorthalidone 25 Mg Tabs (Chlorthalidone) .[name].. 1 Tablet By Mouth Once A Day 3)  Fexofenadine Hcl 180 Mg Tabs (Fexofenadine Hcl) .[name].. 1 Once Daily 4)  Lisinopril 40 Mg Tabs (Lisinopril) .... Take 1 Tablet By Mouth Once A Day 5)  Potassium Chloride Crys Cr 20 Meq Tbcr (Potassium Chloride Crys Cr) .... Take 1 Tablet By Mouth Once Daily 6)  Toprol Xl 50 Mg Tb24 (Metoprolol Succinate) .... Take 1 Tablet By Mouth Once A Day 7)  Multivitamins   Tabs (Multiple Vitamin) .... Once Daily 8)  Calcium 500 Mg  Tabs (Calcium) .... Two Times A Day 9)  Tricor 145 Mg  Tabs (Fenofibrate) .... Take 1 Tablet By Mouth Once A Day 10)  Adult Aspirin Ec Low Strength 81 Mg  Tbec (Aspirin) .[name].. 1 Once Daily 11)  Lantus Solostar 100 Unit/ml  Soln (Insulin Glargine) .... 60 Units At Bedtime 12)  Humalog Kwikpen 100 Unit/ml  Soln (Insulin Lispro (Human)) .... Three Times A Day (Just Before Each Meal) 90-100-200 Units 13)  Accu-Chek Compact Test Drum   Strp (Glucose Blood) .... Use Four Times A Day 14)   Ketoconazole 2 % Crea (Ketoconazole) .... Apply As Needed 15)  Bd U/f Short Pen Needle 31g X 8 Mm Misc (Insulin Pen Needle) .... Use Qid and Hs 16)  Crestor 10 Mg Tabs (Rosuvastatin Calcium) .... One Tablet Twice Weekly 17)  Lomotil 2.5-0.025 Mg Tabs (Diphenoxylate-Atropine) .... One By Mouth Q 6 Hours As Needed Diarrhea. 18)  Promethazine Hcl 25 Mg Tabs (Promethazine Hcl) .... One By Mouth Q 4-6 Hours As Needed For Nausea  Allergies (verified): No Known Drug Allergies  Past History:  Past Medical History: Last updated: 06/30/2010 Allergic rhinitis Coronary artery disease Diabetes mellitus, type II Hypertension Hyperlipidemia elevated transaminases Bell's palsy, April 2011 mild thyromegaly  Review of Systems  The patient denies hypoglycemia.    Physical Exam  General:  normal appearance.   Pulses:  dorsalis pedis intact bilat.   Extremities:  no deformity.  no ulcer on the feet.  feet are of normal color and temp.   1+ right pedal edema and 1+ left pedal edema.   there is a vein harvest scar at the right leg Neurologic:  sensation is intact to touch on the feet, but  decreased from normal Additional Exam:  Fructosamine         [H]  342 umol/L     (converts to a1c of 7.4) Hemoglobin A1C       [H]  9.7 %          Impression & Recommendations:  Problem # 1:  DIABETES MELLITUS, TYPE II (ICD-250.00) a1c again suggests worse control than fructosamine.  at any rate, she needs some adjustment in her therapy  Medications Added to Medication List This Visit: 1)  Humalog Kwikpen 100 Unit/ml Soln (Insulin lispro (human)) .... Three times a day (just before each meal) 110-110-200 units  Other Orders: T-Fructosamine ([id number]) TLB-A1C / Hgb A1C (Glycohemoglobin) (83036-A1C) TLB-A1C / Hgb A1C (Glycohemoglobin) (83036-A1C) Est. Patient Level III (09811)  Patient Instructions: 1)  check your blood sugar 4 times a day--before the 3 meals, and at bedtime.  also check if you have  symptoms of your blood sugar being too high or too low.  please keep a record of the readings and bring it to your next appointment here.  please call us sooner if you are having low blood sugar episodes.   2)  blood tests are being ordered for you today.  please call [phone] to hear your test results. 3)  pending the test results, please continue lantus 60 units at bedtime, and: increase humalog to three times a day (just before each meal), 110-110-200 units. 4)  Please schedule a follow-up appointment in 6 weeks. 5)  (update: i left message on phone-tree:  rx as we discussed) Prescriptions: HUMALOG KWIKPEN 100 UNIT/ML  SOLN (INSULIN LISPRO (HUMAN)) three times a day (just before each meal) 110-110-200 units  #10 boxes x 11   Entered and Authorized by:   Minus Breeding MD   Signed by:   Minus Breeding MD on 11/20/2010   Method used:   Electronically to        [facility]  [phone]* (retail)       [address] [place], [place]  [address]       Ph: [phone] or [phone]       Fax: [phone]   RxID:   [id number]    Orders Added: 1)  T-Fructosamine [phone] 2)  TLB-A1C / Hgb A1C (Glycohemoglobin) [83036-A1C] 3)  TLB-A1C / Hgb A1C (Glycohemoglobin) [83036-A1C] 4)  Est. Patient Level III [60630]

## 2011-01-01 ENCOUNTER — Encounter: Payer: Self-pay | Admitting: Endocrinology

## 2011-01-01 ENCOUNTER — Ambulatory Visit (INDEPENDENT_AMBULATORY_CARE_PROVIDER_SITE_OTHER): Payer: 59 | Admitting: Endocrinology

## 2011-01-01 VITALS — BP 132/78 | HR 92 | Temp 98.6°F | Ht 63.0 in | Wt 195.8 lb

## 2011-01-01 DIAGNOSIS — E119 Type 2 diabetes mellitus without complications: Secondary | ICD-10-CM

## 2011-01-01 NOTE — Patient Instructions (Signed)
check your blood sugar 4 times a day--before the 3 meals, and at bedtime.  also check if you have symptoms of your blood sugar being too high or too low.  please keep a record of the readings and bring it to your next appointment here.  please call us sooner if you are having low blood sugar episodes.   blood tests are being ordered for you today.  please call [phone] to hear your test results. pending the test results, please continue lantus 60 units at bedtime, and: increase humalog to three times a day (just before each meal), 110-120-210 units. Please schedule a follow-up appointment in 6 weeks.

## 2011-01-01 NOTE — Progress Notes (Signed)
  Subjective:    Patient ID: Laurie [name], female    DOB: [birth year], 65 y.o.   MRN: [id number]  HPI pt states she feels well in general, except for arthralgias.  she brings a record of her cbg's which i have reviewed today.  Most are 200-300.   She had 2 cbg's of 84 (one in am, and 1 before lunch).  Otherwise, it is seldom below 200.   Past Medical History  Diagnosis Date  . ALLERGIC RHINITIS 01/30/2007  . CORONARY ARTERY DISEASE 01/30/2007  . DIABETES MELLITUS, TYPE II 01/30/2007  . HYPERTENSION 01/30/2007  . HYPERLIPIDEMIA 01/30/2007  . Bell's palsy 01/05/2010  . GOITER, MULTINODULAR 05/14/2010  . HEPATITIS 12/06/2007  . Thyromegaly     mild   Past Surgical History  Procedure Date  . Coronary artery bypass graft 2000  . Tubal ligation   . Biopsy thyroid 05/2010    percutaneous    reports that she has never smoked. She does not have any smokeless tobacco history on file. Her alcohol and drug histories not on file. family history includes Diabetes in her father and other and Heart disease in her other. No Known Allergies  Review of Systems denies hypoglycemia    Objective:   Physical Exam GENERAL: no distress SKIN:  Insulin injection sites at the anterior abdomen are normal.       Assessment & Plan:  Dm,  needs increased rx

## 2011-01-06 ENCOUNTER — Encounter: Payer: Self-pay | Admitting: Internal Medicine

## 2011-01-07 ENCOUNTER — Ambulatory Visit (INDEPENDENT_AMBULATORY_CARE_PROVIDER_SITE_OTHER): Payer: 59 | Admitting: Internal Medicine

## 2011-01-07 ENCOUNTER — Encounter: Payer: Self-pay | Admitting: Internal Medicine

## 2011-01-07 DIAGNOSIS — I10 Essential (primary) hypertension: Secondary | ICD-10-CM

## 2011-01-07 DIAGNOSIS — J309 Allergic rhinitis, unspecified: Secondary | ICD-10-CM

## 2011-01-07 DIAGNOSIS — E119 Type 2 diabetes mellitus without complications: Secondary | ICD-10-CM

## 2011-01-07 MED ORDER — OMEPRAZOLE 20 MG PO CPDR: 20.0000 mg | DELAYED_RELEASE_CAPSULE | Freq: Every day | ORAL | Status: DC

## 2011-01-07 NOTE — Progress Notes (Signed)
  Subjective:    Patient ID: Laurie [name], female    DOB: Feb 14, 1946, 65 y.o.   MRN: [id number]  HPI   65 year old patient who is seen today for followup. She has a history of diabetes with insulin resistance and has been seen by endocrinology last week. Her insulin dosing up titrated. Her chief complaint today is painful hands and stiffness this been problematic for about 2 weeks she has been using some Aleve which has been quite beneficial but has caused some GI upset.  She  also held her baby aspirin enteric-coated 81 mg  daily due to dyspepsia. She has treated hypertension dyslipidemia coronary artery disease.    Review of Systems  Constitutional: Negative.   HENT: Negative for hearing loss, congestion, sore throat, rhinorrhea, dental problem, sinus pressure and tinnitus.   Eyes: Negative for pain, discharge and visual disturbance.  Respiratory: Negative for cough and shortness of breath.   Cardiovascular: Negative for chest pain, palpitations and leg swelling.  Gastrointestinal: Negative for nausea, vomiting, abdominal pain, diarrhea, constipation, blood in stool and abdominal distention.  Genitourinary: Negative for dysuria, urgency, frequency, hematuria, flank pain, vaginal bleeding, vaginal discharge, difficulty urinating, vaginal pain and pelvic pain.  Musculoskeletal: Positive for arthralgias. Negative for joint swelling and gait problem.  Skin: Negative for rash.  Neurological: Negative for dizziness, syncope, speech difficulty, weakness, numbness and headaches.  Hematological: Negative for adenopathy.  Psychiatric/Behavioral: Negative for behavioral problems, dysphoric mood and agitation. The patient is not nervous/anxious.        Objective:   Physical Exam  Constitutional: She is oriented to person, place, and time. She appears well-developed and well-nourished. No distress.       Moderately overweight. Blood pressure 130/70  HENT:  Head: Normocephalic.  Right Ear:  External ear normal.  Left Ear: External ear normal.  Mouth/Throat: Oropharynx is clear and moist.  Eyes: Conjunctivae and EOM are normal. Pupils are equal, round, and reactive to light.  Neck: Normal range of motion. Neck supple. No thyromegaly present.  Cardiovascular: Normal rate, regular rhythm, normal heart sounds and intact distal pulses.   Pulmonary/Chest: Effort normal and breath sounds normal.  Abdominal: Soft. Bowel sounds are normal. She exhibits no mass. There is no tenderness.  Musculoskeletal: Normal range of motion.  Lymphadenopathy:    She has no cervical adenopathy.  Neurological: She is alert and oriented to person, place, and time.  Skin: Skin is warm and dry. No rash noted.  Psychiatric: She has a normal mood and affect. Her behavior is normal.          Assessment & Plan:  Diabetes mellitus. Recent insulin dose titration per endocrinology Hypertension stable Osteoarthritis. Will place on omeprazole for gastric side of protection and encouraged twice a day Aleve. She has been instructed to resume aspirin therapy Dyslipidemia

## 2011-01-07 NOTE — Patient Instructions (Signed)
Please check your hemoglobin A1c every 3 months Limit your sodium (Salt) intake  You need to lose weight.  Consider a lower calorie diet and regular exercise.    It is important that you exercise regularly, at least 20 minutes 3 to 4 times per week.  If you develop chest pain or shortness of breath seek  medical attention.  Return in 6 months for follow-up

## 2011-01-08 ENCOUNTER — Other Ambulatory Visit: Payer: Self-pay | Admitting: Internal Medicine

## 2011-01-26 NOTE — Procedures (Signed)
DUPLEX DEEP VENOUS EXAM - LOWER EXTREMITY   INDICATION:  Bilateral lower extremity edema   HISTORY:  Edema:  Both  Trauma/Surgery:  No  Pain:  Both  PE:  No  Previous DVT:  No  Anticoagulants:  No  Other:   DUPLEX EXAM:                CFV   SFV   PopV  PTV    GSV                R  L  R  L  R  L  R   L  R  L  Thrombosis    0  0  0  0  0  0  0   0  0  0  Spontaneous   +  +  +  +  +  +  +   +  +  +  Phasic        +  +  +  +  +  +  +   +  +  +  Augmentation  +  +  +  +  +  +  +   +  +  +  Compressible  +  +  +  +  +  +  +   +  +  +  Competent     +  +  +  +  +  +  +   +  +  +   [name]:  + - yes  o - no  p - partial  D - decreased   IMPRESSION:  Bilateral lower extremities appear to be negative for deep  venous thrombosis or deep venous reflux.    _____________________________  Larina Earthly, M.D.   NT/MEDQ  D:  04/03/2010  T:  04/03/2010  Job:  [id number]

## 2011-01-26 NOTE — Consult Note (Signed)
NEW PATIENT CONSULTATION   [name], [name]  DOB:  27-Apr-1946                                       04/03/2010  EAVWU#:[id number]   The patient presents today for evaluation of bilateral lower extremity  swelling.  She is a very pleasant 65 year old white female who reports  that over the past year she has had increased swelling in both lower  extremities.  This is progressive throughout the day and is more so on  her right leg than her left leg.  She did have harvest of her great  saphenous vein on her right leg from the ankle to the thigh according to  her bypass grafting in 2000.  She has not had any vein harvest in her  left leg.  She does have somewhat more swelling in the right than her  left leg.  She does not have any specific pain with this.  She has been  treated with diuretics and had some improvement in her swelling.  She  does not have any history of DVT.  I have her medical records from Dr.  Donnie [name] for review.  She does not have history of arterial insufficiency.  She does have a long history of diabetes, hyperlipidemia, hypertension  and obesity.   REVIEW OF SYSTEMS:  Was reviewed in her chart.  She does not have any  history of stroke.  No weight loss or weight gain.  Her weight is 189  pounds.  She is 5 feet 4 inches tall.   SOCIAL HISTORY:  She is married with the 2 children.  She is a  housewife.  She does not smoke or drink alcohol.   PHYSICAL EXAMINATION:  Well-developed, well-nourished, moderately obese  white female in no acute distress.  Blood pressure is 145/75, pulse 80,  respirations 18, temperature 97.9.  HEENT:  Normal.  Neck:  Carotids  without bruits.  Heart:  Regular rate and rhythm.  I do not appreciate a  murmur.  Chest:  Clear bilaterally.  Her radial pulses are 2+  bilaterally.  She has 2+ popliteal and 2+ dorsalis pedis pulses  bilaterally.  Abdomen:  Mild obesity and no masses.  Musculoskeletal:  She has no major  deformities or cyanosis.  Neurologic:  No focal  weakness, paresthesias.  Skin:  Without ulcers or rashes.  She does have  pitting edema in both lower extremities.  This is not marked.  She does  not have any evidence of venous varicosities.   She underwent noninvasive vascular laboratory studies in our office, and  this revealed no evidence of DVT and no evidence of deep venous reflux  bilaterally.  I discussed this at length with the patient, explaining  that I do not see any evidence of arterial or venous pathology.  I  explained that her swelling appears to be relatively moderate currently,  and I would continue with elevation, diuretic and consider compression  garments should she have marked progression of this.  She understands  and will see [place] on an as-needed basis.     Laurie [name], M.D.  Electronically Signed   TFE/MEDQ  D:  04/03/2010  T:  04/03/2010  Job:  [id number]   cc:   Laurie [name], M.D.  Laurie Savers, MD

## 2011-01-29 NOTE — Op Note (Signed)
[place]  Patient:    Laurie [name]                      MRN: [id number] Proc. Date: 07/22/99 Adm. Date:  [id number] Attending:  Cleatrice Burke CC:         Alleen Borne, M.D., CVTS office             W. Ashley Royalty., M.D., [facility] Cardiology Office             Cardiac Cath Lab, [facility]                           Operative Report  PREOPERATIVE DIAGNOSIS:  Severe 3-vessel coronary artery disease with unstable angina.  POSTOPERATIVE DIAGNOSIS:  Severe 3-vessel coronary artery disease with unstable  angina.  PROCEDURE:  Median sternotomy, extracorporeal circulation, coronary artery bypass graft surgery x 4 using left internal mammary artery graft to left anterior descending coronary artery, with a saphenous vein graft to the diagonal branch f the left anterior descending, a saphenous vein graft to the obtuse marginal branch of the left circumflex coronary artery, and a saphenous vein graft to the posterior descending branch of the right coronary artery.  SURGEON:  Alleen Borne, M.D.  ASSISTANT:  Lynnda Shields, P.A.C.  ANESTHESIA:  General endotracheal.  CLINICAL HISTORY:  This patient is a 65 year old white female with diabetes mellitus and hypertension with a known history of coronary artery disease in the past, who underwent catheterization in 1995 showing 3-vessel coronary disease. She was treated medically at that time and was feeling fairly well until last Friday, when she developed aching pain in both elbows with radiation to her fingertips, as well as, nausea and weakness.  These symptoms recurred.  On evaluation, she was  noted to have ST elevation on her electrocardiogram, which resolved after nitroglycerin and disappearance of the chest pain.  She underwent cardiac catheterization, which showed severe 3-vessel disease.  The LAD had an 80% segmental calcified stenosis proximally.  There is a 90%  stenosis of a small first diagonal branch.  The left circumflex had a 90-95% segmental proximal lesion. There was a single marginal branch that was diffusely diseased.  The right coronary artery had a proximal 70% and 95% mid vessel stenosis just before the takeoff of a large acute marginal branch.  Left ventricular function showed mild inferior hypokinesis.  After review of the angiogram and examination of the patient, it as felt that coronary artery bypass graft surgery was the best treatment.  I discussed the operative procedure, alternatives to surgery, benefits and risk, including bleeding, possible blood transfusion, infection, stroke, myocardial infarction nd death with the patient and she understood and agreed to proceed.  OPERATIVE PROCEDURE:  The patient was taken to the operating room and placed on the table in supine position.  After induction of general endotracheal anesthesia, Foley catheter is placed in bladder using sterile technique.  Then, the chest, abdomen and both lower extremities are prepped and draped in usual sterile manner. The chest is entered through a median sternotomy incision.  The pericardium opened in the midline.  Examination of the heart showed good ventricular contractility. The ascending aorta was small and had no palpable plaques in it.  Then, the left internal mammary artery was harvested from the chest wall as a pedicle graft.  This was a medium caliber vessel with excellent blood flow through  it.  At the same time, a segment of greater saphenous vein was harvested from the right leg and this vein was of medium caliber and good quality.  Then, the patient was heparinized and when an adequate activated clotting time as achieved, the distal ascending aorta was cannulated using a 6.5 mm aortic cannula for arterial inflow.  Venous outflow was achieved using a 2-stage venous cannula through the right atrial appendage.  An antegrade  cardioplegia and vent cannula was inserted in the aortic root.  The patient was placed on cardiopulmonary bypass and distal coronaries identified. The LAD was a medium size graftable vessel that had mild distal disease in it. The diagonal branch was small, but graftable.  The obtuse marginal was intramyocardial and visible just under the surface of the muscle.  It was dissected from the muscle in its mid portion.  This artery was fragile and diffusely diseased, but felt to be graftable.  However, also several small epicardial veins crossing over the artery on the surface and these had to be ligated between clips.  The right coronary gave off the small to medium size acute marginal branch, which crossed over the acute margin down onto the inferior wall.  It then gave off a posterior descending artery, which was lying more lateral than usual.  This vessel had mild disease n it.  Then the aorta was cross-clamp and 500 cc of cold blood antegrade cardioplegia as administered in the aortic root with quick arrest of the heart.  Systemic hypothermia to 20 degrees Centigrade and topical hypothermia with iced saline was used.  A temperature probe is placed in the septum and insulating pad in the pericardium.  The first distal anastomosis was performed to the obtuse marginal artery.  The internal diameter of this vessel was about 1.5-1.6 mm.  The conduit used was a segment of greater saphenous vein and the anastomosis performed in an end-to-side manner using continuous 8-0 Prolene suture.  Flow is measured through the graft and was excellent.  The epicardial veins crossing over this area were suture ligated with 7-0 Prolene sutures.  Then a dose of cardioplegia was given down the vein graft and in the aortic root.  The second distal anastomosis was performed to the posterior descending coronary artery.  The internal diameter was 1.6 mm.  The conduit used was a second segment of  greater saphenous vein and the anastomosis performed in an end-to-side manner using continuous 7-0 Prolene suture.  Flow is measured through the graft and was excellent.   The third distal anastomosis was performed to the diagonal branch.  The internal diameter was 1.5 mm.  The conduit used was a third segment of greater saphenous  vein and the anastomosis performed in an end-to-side manner using continuous 7-0 Prolene suture.  Flow is measured through the graft and was excellent.  Then another dose of cardioplegia was given down the vein grafts and the aortic root.  The fourth distal anastomosis was performed to the mid portion of the left anterior descending coronary artery.  The internal diameter was about 1.7 mm.  The conduit used was the left internal mammary artery and this was brought out through an opening in the left pericardium anterior to the phrenic nerve.  It was anastomosed to the LAD in an end-to-side manner using continuous 8-0 Prolene suture.  The pedicle was tacked to epicardium with 6-0 Prolene sutures.  The patient was rewarmed to 37 degrees Centigrade and the clamp removed from the mammary pedicle.  There is rapid warming of ventricular septum and return of spontaneous ventricular fibrillation.  The cross-clamp was removed with a time of 76 minutes and the patient defibrillated in sinus rhythm.  A partial occlusion clamp is placed on the aortic root and the three proximal vein graft anastomoses were performed in end-to-side manner using continuous 6-0 Prolene suture.  The clamp was removed and the vein grafts deaired and the clamps removed from them.  The proximal and distal anastomoses appeared hemostatic and the lying of the grafts satisfactory.  Graft markers are placed around the proximal anastomoses.  Two temporary right ventricular and right atrial pacing wires were placed and brought out through the skin.  When the patient had rewarmed to 37  degrees Centigrade, she was weaned from cardiopulmonary bypass on no inotropic agents.  Total bypass time was 128 minutes. Cardiac function appeared excellent with a cardiac output of 5 L/min. Protamine was given and the venous and aortic cannulae removed without difficulty. Hemostasis was achieved.  Three chest tubes were placed with a tube in the posterior pericardium, one in the left pleural space and one in the anterior mediastinum.  The pericardium is reapproximated over the heart.  The sternum is  closed with #6 stainless steel wires.  Fascia was closed with continuous #1 Vicryl suture.  Subcutaneous tissue was closed with continuous 2-0 Vicryl and the skin  with 3-0 Vicryl subcuticular closure.  Lower extremity vein harvest site is closed layers in a similar manner.  The sponge, needle and instrument counts are correct according to scrub nurse.  Dry sterile dressings were applied over the incisions around the chest tubes, which were hooked to Pleur-evac suction.  The patient remained hemodynamically stable and transported to the SICU in guarded, but stable condition. DD:  07/22/99 TD:  07/23/99 Job: [id number]

## 2011-01-29 NOTE — Procedures (Signed)
Laurie [name], Laurie [name]              ACCOUNT NO.:  [id number]   MEDICAL RECORD NO.:  [id number]          PATIENT TYPE:  OUT   LOCATION:  SLEEP CENTER                 FACILITY:  [facility]   PHYSICIAN:  Coralyn Helling, M.D.      DATE OF BIRTH:  06/25/1946   DATE OF STUDY:  01/31/2006                              NOCTURNAL POLYSOMNOGRAM   REFERRING PHYSICIAN:  Coralyn Helling, M.D.   INDICATIONS FOR SURGERY:  This is an individual who had undergone an  overnight polysomnogram on April5,2007 which showed an apnea/hypopnea index  of 67.2 and oxygen saturation nadir of 80%.  She returns to the sleep lab  for a CPAP titration study.   EPWORTH SCORE:  17/24.   MEDICATIONS:  Aspirin, Centrum Silver, liquid joint relief, liquid coral  calcium, omega 3 fish oil, Lasix, metformin, glyburide, Actos, Lipitor,  lisinopril, Toprol XL, hydrochlorothiazide, potassium chloride, fexofenadine  and Astelin nasal spray.   SLEEP ARCHITECTURE:  Total recording time was 421.5 minutes.  Total sleep  time was 375 minutes.  Sleep efficiency was 89%.  Of note is that there was  absence of slow wave sleep with a relative increase in percentage of stage 2  sleep to 66% of sleep time.  Sleep latency was 16.5 minutes.  REM latency  was 117 minutes.  The patient was observed in both the supine and nonsupine  position.   RESPIRATORY DATA:  The patient was titrated from a CPAP pressure setting of  5 to 13 cm of water.  Snoring was eliminated at 13 cm of water.  Also at 13  cm of water, the apnea copy index was reduced to 0.6, and the patient was  observed in both REM sleep and the supine position at this pressure setting,  and the average respiratory rate was 16.   OXYGEN DATA:  The baseline oxygen level was 93%.  The mean oxygenation  during non-REM sleep was 96.6% at a CPAP pressure setting at 13 and during  REM was 97.1%.   CARDIAC DATA:  EKG showed normal sinus rhythm with an average heart rate 75.   MOVEMENT AND  PARASOMNIA:  The periodic limb movement index was 4.6.   IMPRESSION:  This was a CPAP titration study at a pressure setting of 15 cm  of water.  The apnea copy index was reduced to 0.6, and snoring was  eliminated.  The patient was observed in both REM sleep and supine sleep at  this pressure setting, and sleep architecture was stabilized.  Of note is  that during the post-sleep questionnaire, the patient  did feel a significant improvement in her quality of sleep.  Therefore, the  patient should be started on a CPAP pressure setting of 15 cm water and  followed up clinically for her tolerance and compliance of CPAP therapy.      Coralyn Helling, M.D.  Diplomat, Biomedical engineer of Sleep Medicine  Electronically Signed     VS/MEDQ  D:  02/14/2006 17:34:35  T:  02/15/2006 13:19:41  Job:  [id number]

## 2011-01-29 NOTE — H&P (Signed)
[place]  Patient:    Laurie [name]                      MRN: [id number] Adm. Date:  [id number] Attending:  Norman Clay CC:         Gordy Savers, M.D. [facility]                         History and Physical  HISTORY OF PRESENT ILLNESS:  This 65 year old female who has a known history of  diabetes mellitus and hypertension.  In 1995, she developed exertional angina, nd had an abnormal treadmill test.  Cardiac catheterization was done at that time showing three-vessel coronary artery disease with moderate diffuse narrowing proximally, 70%-80% proximal LAD after the diagonal branch, 90% marginal stenosis, 70% continuation branch stenosis, 60%-70% stenosis in the mid-right involving the origin of an acute marginal branch, 60%-70% branch PDA lesion.  At the time, a decision was made to treat her medically, and she had improvement in her symptoms with beta-blockers. Since that time, beta-blocker therapy has been stopped by her primary care physician.  She has been exercising and has been feeling well. This past Friday, she developed an aching pain involving both elbows, radiating down to her finger tips, which would come and go.  It recurred on and off through the weekend and became more severe and pronounced and associated with weakness this  morning.  It waxed and waned most of the morning, and would be relieved with nitroglycerin, but would return after 15-20 minutes.  She was seen at Dr. Olena Leatherwood office where an electrocardiogram during one episode of pain showed ST elevation, but then improvement of symptoms was noted with normalization on he next electrocardiogram.  She was transferred to the emergency room, and at the present time is not having any significant symptoms or chest pain.  PAST MEDICAL HISTORY:  Remarkable for diabetes mellitus and hypertension.  She oes not think she has hyperlipidemia.  Has  not had any recent gastrointestinal problems.  PAST SURGICAL HISTORY: 1. Right middle finger cyst. 2. Tubal ligation.  ALLERGIES:  No known drug allergies.  CURRENT MEDICATIONS: 1. Prinivil q.d. 2. Glucophage 850 mg b.i.d. 3. Glyburide 1.5 mg q.d. 4. Aspirin q.d. 5. Allegra q.d.  FAMILY HISTORY:  Father died at age 43 of a myocardial infarction.  Mother died at age 45 of a stroke and complications of diabetes mellitus.  Four sisters are living.  She has a  son age 55 and a daughter age 25.  SOCIAL HISTORY:  She is a Futures trader.  Her husband works as a Dealer for the railroad, [employer] and [place].  She does not smoke or use alcohol.  She has  been exercising.  REVIEW OF SYSTEMS:  She is obese but her weight has remained stable.  There are no ENT complaints.  No diabetic retinopathy.  No difficulty swallowing.  No hearing complaints.  Perimenopauseal.  No GU symptoms.  No pulmonary symptoms.  No claudication.  No numbness in the lower extremities.  PHYSICAL EXAMINATION:  GENERAL:  She is an obese pleasant woman, who appears in no acute distress.  VITAL SIGNS:  Blood pressure 110/70, pulse currently 110 and regular.  SKIN:  Warm and dry.  HEENT:  She wears glasses.  EOMI.  PERRLA.  C&S clear.  Fundi normal.  Pharynx negative.  NECK:  Supple without masses.  No thyromegaly.  No  jugular venous distention, o carotid bruits.  LUNGS:  Clear to A&P.  CARDIOVASCULAR:  Tachycardia.  There is a soft systolic murmur.  No S3 noted.  ABDOMEN:  Soft, obese, nontender.  There is no mass or organomegaly.  PULSES:  Femoral pulses 2+, pedal pulses 3+.  EXTREMITIES:  There are no varicosities, no edema present.  NEUROLOGIC:  Grossly normal.  GENITOURINARY/RECTAL/BREASTS:  Examinations deferred due to cardiac condition.  A 12-lead electrocardiogram here shows a right bundle branch block pattern. There is no significant ST elevation noted on this  electrocardiogram.  A chest x-ray is normal.  LABORATORY DATA:  Pending at the time of dictation.  IMPRESSION: 1. Chest pain syndrome, consistent with unstable angina pectoris, rule out    myocardial infarction. 2. Known three-vessel coronary artery disease at previous catheterization. 3. Non-insulin-dependent diabetes mellitus. 4. Hypertension. 5. Obesity.  RECOMMENDATIONS:  She is brought into the hospital and will be stabilized.  She  will be administered heparin, aspirin, and IIbIIIa inhibitor, and Integrilin will be started.  If recurrent pain, an emergency cardiac catheterization, otherwise an urgent catheterization in the morning.  Rule out myocardial infarction with serial CPKs and electrocardiograms. DD:  07/20/99 TD:  07/20/99 Job: [id number]

## 2011-01-29 NOTE — Assessment & Plan Note (Signed)
[facility]                               PULMONARY OFFICE NOTE   NAME:Kunin, ARAIYAH CUMPTON                     MRN:          [id number]  DATE:04/27/2003                            DOB:          14-Sep-1945    I saw Ms. Bow today in followup for her obstructive sleep apnea.  She is  currently on CPAP of 15 cm of water with heat humidification and she is  using a full face mask.  She said that she has noticed that she has had  symptomatic benefit from the use of her CPAP, but she is not able to use it  for the entire night.  Her main complaint right now is that at some time in  the middle of the night she starts to notice a leak from the side of her  mask, and as a result of this she has difficulty falling back to sleep,  until she is able to actually take the mask off.  She said that she has  noticed that if she is able to use the mask for a longer period of time at  night she feels much better during the day.  She apparently had been tried  initially on a nasal mask, but then was having problems with mouth leak, and  I do not think she was ever actually tried on a chin strap, although she  appeared to feel more comfortable using the nasal mask.   At this time what I have suggested for her to do is try using a nasal mask  again, with a chin strap and see if this makes any difference.  Additionally, I have explained to her that she should try to maintain a  diet, exercise and weight reduction program, as this could offer relief of  her sleep apnea, in addition to several of her other health problems.  I  will plan on following up with her in approximately three to four months.                                   Coralyn Helling, MD   VS/MedQ  DD:  04/26/2006  DT:  04/27/2006  Job #:  [id number]

## 2011-01-29 NOTE — Procedures (Signed)
NAMENESIAH, JUMP              ACCOUNT NO.:  [id number]   MEDICAL RECORD NO.:  [id number]          PATIENT TYPE:  OUT   LOCATION:  SLEEP CENTER                 FACILITY:  [facility]   PHYSICIAN:  Clinton D. Maple Hudson, M.D. DATE OF BIRTH:  1946/01/26   DATE OF STUDY:                              NOCTURNAL POLYSOMNOGRAM   REFERRING PHYSICIAN:  Dr. Amelia Jo.   INDICATIONS FOR STUDY:  Hypersomnia with sleep apnea.  Epworth sleepiness  score 12/24, BMI 32.9.  Weight 186 pounds.  Home medications are reviewed as  listed.  A diagnostic MPSG was ordered.   SLEEP ARCHITECTURE:  Total sleep time of 328 minutes with sleep efficiency  88%.  Stage I was 7%, stage II 68%, stages III and IV 12%, REM 13% of total  sleep time.  Sleep latency 9 minutes, REM latency 45 minutes, awake after  sleep onset 34 minutes, arousal index 33.8 per hour.  No bedtime medication  reported.   RESPIRATORY DATA:  MPSG protocol.  Apnea/hypopnea index (AHI, RDI) 67.2  obstructive events per hour indicating severe obstructive sleep  apnea/hypopnea syndrome.  This included 115 obstructive apneas, 253  hypopneas.  Events were not positional with significant numbers while supine  and while sleeping on left side.  REM AHI of 75 and 75.7 per hour.   OXYGEN DATA:  Moderate to loud snoring with oxygen desaturation to a nadir  of 80%.  Mean oxygen saturation through the study was 93% on room air.   CARDIAC DATA:  Normal sinus rhythm with abnormal ST configuration (history  of coronary bypass graft surgery).   MOVEMENT/PARASOMNIA:  Occasional leg jerk with little effect on sleep.   IMPRESSION/RECOMMENDATIONS:  1.  Severe obstructive sleep apnea/hypopnea syndrome, AHI of 67.2 per hour      with nonpositional events, moderate to loud snoring and oxygen      desaturation to 80%.  2.  Consider return for CPAP titration or evaluate for alternative therapies      as appropriate.      Clinton D. Maple Hudson, M.D.  Diplomate,  Biomedical engineer of Sleep Medicine  Electronically Signed     CDY/MEDQ  D:  12/19/2005 13:11:15  T:  12/20/2005 08:21:52  Job:  [id number]

## 2011-02-12 ENCOUNTER — Other Ambulatory Visit (INDEPENDENT_AMBULATORY_CARE_PROVIDER_SITE_OTHER): Payer: 59

## 2011-02-12 ENCOUNTER — Encounter: Payer: Self-pay | Admitting: Endocrinology

## 2011-02-12 ENCOUNTER — Ambulatory Visit (INDEPENDENT_AMBULATORY_CARE_PROVIDER_SITE_OTHER): Payer: 59 | Admitting: Endocrinology

## 2011-02-12 VITALS — BP 126/74 | HR 86 | Temp 98.6°F | Ht 63.0 in | Wt 196.8 lb

## 2011-02-12 DIAGNOSIS — E119 Type 2 diabetes mellitus without complications: Secondary | ICD-10-CM

## 2011-02-12 LAB — HEMOGLOBIN A1C: Hgb A1c MFr Bld: 9.6 % — ABNORMAL HIGH (ref 4.6–6.5)

## 2011-02-12 MED ORDER — INSULIN GLARGINE 100 UNIT/ML ~~LOC~~ SOLN: 60.0000 [IU] | Freq: Every day | SUBCUTANEOUS | Status: DC

## 2011-02-12 NOTE — Patient Instructions (Addendum)
check your blood sugar 4 times a day--before the 3 meals, and at bedtime.  also check if you have symptoms of your blood sugar being too high or too low.  please keep a record of the readings and bring it to your next appointment here.  please call us sooner if you are having low blood sugar episodes.   blood tests are being ordered for you today.  please call [phone] to hear your test results.  You will be prompted to enter the 9-digit "MRN" number that appears at the top left of this page, followed by #.  Then you will hear the message. pending the test results, please continue lantus 60 units at bedtime, and: increase humalog to three times a day (just before each meal), 110-140-230 units. Please schedule a follow-up appointment in 6 weeks.

## 2011-02-12 NOTE — Progress Notes (Signed)
Subjective:    Patient ID: Laurie [name], female    DOB: [birth year], 65 y.o.   MRN: [id number]  HPI pt states she feels well in general.  she brings a record of her cbg's which i have reviewed today.  She had 1 episode of mild hypoglycemia, before lunch.  It is highest in the late afternoon, and at hs. It is often over 200.   Past Medical History  Diagnosis Date  . ALLERGIC RHINITIS 01/30/2007  . CORONARY ARTERY DISEASE 01/30/2007  . DIABETES MELLITUS, TYPE II 01/30/2007  . HYPERTENSION 01/30/2007  . HYPERLIPIDEMIA 01/30/2007  . Bell's palsy 01/05/2010  . GOITER, MULTINODULAR 05/14/2010  . HEPATITIS 12/06/2007  . Thyromegaly     mild    Past Surgical History  Procedure Date  . Coronary artery bypass graft 2000  . Tubal ligation   . Biopsy thyroid 05/2010    percutaneous    History   Social History  . Marital Status: Married    Spouse Name: N/A    Number of Children: 2  . Years of Education: N/A   Occupational History  .     Social History Main Topics  . Smoking status: Never Smoker   . Smokeless tobacco: Never Used  . Alcohol Use: No  . Drug Use: No  . Sexually Active: Not on file   Other Topics Concern  . Not on file   Social History Narrative   Does not work outside the home    Current Outpatient Prescriptions on File Prior to Visit  Medication Sig Dispense Refill  . ALLEGRA ALLERGY 180 MG tablet TAKE 1 TABLET DAILY  50 tablet  3  . azelastine (ASTELIN) 137 MCG/SPRAY nasal spray 2 sprays by Nasal route 2 (two) times daily. Use in each nostril as directed       . Calcium Carbonate (CALCIUM 500 PO) Take 1 tablet by mouth 2 (two) times daily.        . chlorthalidone (HYGROTON) 25 MG tablet Take 25 mg by mouth daily.        . Glucosamine-Chondroitin (OSTEO BI-FLEX REGULAR STRENGTH PO) Take 1 capsule by mouth daily.        [name] glucose blood (ACCU-CHEK COMPACT TEST DRUM) test strip Use four times a day       . insulin lispro (HUMALOG KWIKPEN) 100 UNIT/ML injection  Inject into the skin 3 (three) times daily before meals. 3x a day (just before each meal), 110-140-230 units      . Insulin Pen Needle (B-D ULTRAFINE III SHORT PEN) 31G X 8 MM MISC Use  Qid and hs       . ketoconazole (NIZORAL) 2 % cream Apply topically as needed.        [name] lisinopril (PRINIVIL,ZESTRIL) 40 MG tablet Take 40 mg by mouth daily.        . metoprolol (TOPROL-XL) 50 MG 24 hr tablet Take 50 mg by mouth daily.        . Multiple Vitamin (MULTIVITAMIN) tablet Take 1 tablet by mouth daily.        . Naproxen Sodium (ALEVE) 220 MG CAPS Take by mouth. As needed for arthritis pain       . omeprazole (PRILOSEC) 20 MG capsule Take 1 capsule (20 mg total) by mouth daily.  30 capsule  11  . potassium chloride SA (K-DUR,KLOR-CON) 20 MEQ tablet TAKE 1 TABLET DAILY  90 tablet  3  . rosuvastatin (CRESTOR) 10 MG tablet 1 tablet twice weekly       .  TRICOR 145 MG tablet TAKE 1 TABLET DAILY  90 tablet  3  . DISCONTD: insulin glargine (LANTUS SOLOSTAR) 100 UNIT/ML injection Inject 60 Units into the skin at bedtime.          No Known Allergies  Family History  Problem Relation Age of Onset  . Diabetes Father   . Diabetes Other     Siblings and 2 children  . Heart disease Other     CAD    BP 126/74  Pulse 86  Temp(Src) 98.6 F (37 C) (Oral)  Ht 5\' 3"  (1.6 m)  Wt 196 lb 12.8 oz (89.268 kg)  BMI 34.86 kg/m2  SpO2 94%    Review of Systems Denies loc    Objective:   Physical Exam GENERAL: no distress SKIN: Insulin injection sites at the anterior abdomen are normal       Assessment & Plan:  Dm, needs increased rx

## 2011-02-26 ENCOUNTER — Other Ambulatory Visit: Payer: Self-pay | Admitting: *Deleted

## 2011-02-26 MED ORDER — INSULIN LISPRO 100 UNIT/ML ~~LOC~~ SOLN: SUBCUTANEOUS | Status: DC

## 2011-02-26 NOTE — Telephone Encounter (Signed)
[facility] called requesting refill of pt's insulin be sent for a 1 month's supply to [facility] and also a 90 day supply to [facility]-pt is out of medication

## 2011-03-02 ENCOUNTER — Other Ambulatory Visit: Payer: Self-pay | Admitting: Endocrinology

## 2011-03-02 MED ORDER — INSULIN LISPRO 100 UNIT/ML ~~LOC~~ SOLN: SUBCUTANEOUS | Status: DC

## 2011-03-30 ENCOUNTER — Ambulatory Visit: Payer: 59 | Admitting: Endocrinology

## 2011-04-15 ENCOUNTER — Ambulatory Visit (INDEPENDENT_AMBULATORY_CARE_PROVIDER_SITE_OTHER): Payer: 59 | Admitting: Endocrinology

## 2011-04-15 ENCOUNTER — Encounter: Payer: Self-pay | Admitting: Endocrinology

## 2011-04-15 VITALS — BP 138/74 | HR 90 | Temp 99.0°F | Ht 63.0 in | Wt 196.8 lb

## 2011-04-15 DIAGNOSIS — E119 Type 2 diabetes mellitus without complications: Secondary | ICD-10-CM

## 2011-04-15 MED ORDER — TRIAMCINOLONE ACETONIDE 0.025 % EX CREA: TOPICAL_CREAM | Freq: Three times a day (TID) | CUTANEOUS | Status: DC

## 2011-04-15 NOTE — Progress Notes (Signed)
Subjective:    Patient ID: Laurie [name], female    DOB: 17-Apr-1946, 65 y.o.   MRN: [id number]  HPI she brings a record of her cbg's which i have reviewed today.  she brings a record of her cbg's which i have reviewed today.  It varies from 95-200's, but most are approx 200.  She says the 95 was at lunch.  She had taken her breakfast inulin, but did not eat.  There is no trend throughout the day, except it is lower in am, than at hs.   Past Medical History  Diagnosis Date  . ALLERGIC RHINITIS 01/30/2007  . CORONARY ARTERY DISEASE 01/30/2007  . DIABETES MELLITUS, TYPE II 01/30/2007  . HYPERTENSION 01/30/2007  . HYPERLIPIDEMIA 01/30/2007  . Bell's palsy 01/05/2010  . GOITER, MULTINODULAR 05/14/2010  . HEPATITIS 12/06/2007  . Thyromegaly     mild    Past Surgical History  Procedure Date  . Coronary artery bypass graft 2000  . Tubal ligation   . Biopsy thyroid 05/2010    percutaneous    History   Social History  . Marital Status: Married    Spouse Name: N/A    Number of Children: 2  . Years of Education: N/A   Occupational History  .     Social History Main Topics  . Smoking status: Never Smoker   . Smokeless tobacco: Never Used  . Alcohol Use: No  . Drug Use: No  . Sexually Active: Not on file   Other Topics Concern  . Not on file   Social History Narrative   Does not work outside the home    Current Outpatient Prescriptions on File Prior to Visit  Medication Sig Dispense Refill  . azelastine (ASTELIN) 137 MCG/SPRAY nasal spray 2 sprays by Nasal route 2 (two) times daily. Use in each nostril as directed       . Calcium Carbonate (CALCIUM 500 PO) Take 1 tablet by mouth 2 (two) times daily.        . chlorthalidone (HYGROTON) 25 MG tablet Take 25 mg by mouth daily.        . Glucosamine-Chondroitin (OSTEO BI-FLEX REGULAR STRENGTH PO) Take 1 capsule by mouth daily.        [name] glucose blood (ACCU-CHEK COMPACT TEST DRUM) test strip Use four times a day       . insulin  glargine (LANTUS SOLOSTAR) 100 UNIT/ML injection Inject 60 Units into the skin at bedtime.  60 mL  3  . Insulin Pen Needle (B-D ULTRAFINE III SHORT PEN) 31G X 8 MM MISC Use  Qid and hs       . ketoconazole (NIZORAL) 2 % cream Apply topically as needed.        [name] lisinopril (PRINIVIL,ZESTRIL) 40 MG tablet Take 40 mg by mouth daily.        . metoprolol (TOPROL-XL) 50 MG 24 hr tablet Take 50 mg by mouth daily.        . Multiple Vitamin (MULTIVITAMIN) tablet Take 1 tablet by mouth daily.        . Naproxen Sodium (ALEVE) 220 MG CAPS Take by mouth. As needed for arthritis pain       . omeprazole (PRILOSEC) 20 MG capsule Take 1 capsule (20 mg total) by mouth daily.  30 capsule  11  . potassium chloride SA (K-DUR,KLOR-CON) 20 MEQ tablet TAKE 1 TABLET DAILY  90 tablet  3  . rosuvastatin (CRESTOR) 10 MG tablet 1 tablet twice weekly       .  TRICOR 145 MG tablet TAKE 1 TABLET DAILY  90 tablet  3  . ALLEGRA ALLERGY 180 MG tablet TAKE 1 TABLET DAILY  50 tablet  3    No Known Allergies  Family History  Problem Relation Age of Onset  . Diabetes Father   . Diabetes Other     Siblings and 2 children  . Heart disease Other     CAD    BP 138/74  Pulse 90  Temp(Src) 99 F (37.2 C) (Oral)  Ht 5\' 3"  (1.6 m)  Wt 196 lb 12.8 oz (89.268 kg)  BMI 34.86 kg/m2  SpO2 95%  Review of Systems denies hypoglycemia    Objective:   Physical Exam GENERAL: no distress Pulses: dorsalis pedis intact bilat.   Feet: no deformity.  no ulcer on the feet.  feet are of normal color and temp.  1+ bilat leg edema.  There is a left-leg vein harvest scar. Neuro: sensation is intact to touch on the feet    Assessment & Plan:  Dm, characterized by severe insulin resistance.  needs increased rx

## 2011-04-15 NOTE — Patient Instructions (Addendum)
check your blood sugar 4 times a day--before the 3 meals, and at bedtime.  also check if you have symptoms of your blood sugar being too high or too low.  please keep a record of the readings and bring it to your next appointment here.  please call us sooner if you are having low blood sugar episodes.   blood tests are being ordered for you today.  please call [phone] to hear your test results.  You will be prompted to enter the 9-digit "MRN" number that appears at the top left of this page, followed by #.  Then you will hear the message. pending the test results, please continue lantus 60 units at bedtime, and: increase humalog to three times a day (just before each meal), 120-150-240 units. Please schedule a follow-up appointment in 6 weeks.

## 2011-04-15 NOTE — Progress Notes (Signed)
Addended by: Romero Belling on: 04/15/2011 03:14 PM   Modules accepted: Orders

## 2011-04-27 ENCOUNTER — Encounter: Payer: Self-pay | Admitting: Cardiology

## 2011-05-28 ENCOUNTER — Ambulatory Visit: Payer: 59 | Admitting: Endocrinology

## 2011-06-03 ENCOUNTER — Encounter: Payer: Self-pay | Admitting: Internal Medicine

## 2011-06-03 ENCOUNTER — Ambulatory Visit (INDEPENDENT_AMBULATORY_CARE_PROVIDER_SITE_OTHER): Payer: 59 | Admitting: Internal Medicine

## 2011-06-03 DIAGNOSIS — Z0000 Encounter for general adult medical examination without abnormal findings: Secondary | ICD-10-CM

## 2011-06-03 DIAGNOSIS — I2510 Atherosclerotic heart disease of native coronary artery without angina pectoris: Secondary | ICD-10-CM

## 2011-06-03 DIAGNOSIS — E119 Type 2 diabetes mellitus without complications: Secondary | ICD-10-CM

## 2011-06-03 DIAGNOSIS — I10 Essential (primary) hypertension: Secondary | ICD-10-CM

## 2011-06-03 DIAGNOSIS — R109 Unspecified abdominal pain: Secondary | ICD-10-CM

## 2011-06-03 LAB — COMPREHENSIVE METABOLIC PANEL
Albumin: 4.1 g/dL (ref 3.5–5.2)
Alkaline Phosphatase: 65 U/L (ref 39–117)
Glucose, Bld: 195 mg/dL — ABNORMAL HIGH (ref 70–99)
Potassium: 3.4 mEq/L — ABNORMAL LOW (ref 3.5–5.1)
Sodium: 137 mEq/L (ref 135–145)
Total Protein: 7.6 g/dL (ref 6.0–8.3)

## 2011-06-03 LAB — POCT URINALYSIS DIPSTICK
Bilirubin, UA: NEGATIVE
Blood, UA: NEGATIVE
Glucose, UA: NEGATIVE
Spec Grav, UA: 1.005
Urobilinogen, UA: 0.2

## 2011-06-03 LAB — SEDIMENTATION RATE: Sed Rate: 29 mm/hr — ABNORMAL HIGH (ref 0–22)

## 2011-06-03 NOTE — Progress Notes (Signed)
  Subjective:    Patient ID: Laurie [name], female    DOB: 1946-03-16, 65 y.o.   MRN: [id number]  HPI  65 year old patient who has a history of hypertension. She is also followed by endocrinology for type 2 diabetes she has stable coronary artery disease. Complaints include abdominal pain this initially began in July and again in the left lower quadrant. She was seen in urgent care at the end of July he has also had a subsequent visit she also complains of some right infrascapular pain that began in the earlier part of this month at the present time she describes right-sided hip pain as well as right and lower abdominal discomfort. She describes intermittent fever and a general sense of unwellness her blood sugars have been stable. She has had no recent colonoscopy. No change in her bowel habits.  She had a sigmoidoscopy in 2003    Review of Systems  Constitutional: Positive for fever and fatigue.  HENT: Negative for hearing loss, congestion, sore throat, rhinorrhea, dental problem, sinus pressure and tinnitus.   Eyes: Negative for pain, discharge and visual disturbance.  Respiratory: Negative for cough and shortness of breath.   Cardiovascular: Negative for chest pain, palpitations and leg swelling.  Gastrointestinal: Positive for abdominal pain. Negative for nausea, vomiting, diarrhea, constipation, blood in stool and abdominal distention.  Genitourinary: Negative for dysuria, urgency, frequency, hematuria, flank pain, vaginal bleeding, vaginal discharge, difficulty urinating, vaginal pain and pelvic pain.  Musculoskeletal: Positive for back pain. Negative for joint swelling, arthralgias and gait problem.  Skin: Negative for rash.  Neurological: Negative for dizziness, syncope, speech difficulty, weakness, numbness and headaches.  Hematological: Negative for adenopathy.  Psychiatric/Behavioral: Negative for behavioral problems, dysphoric mood and agitation. The patient is not nervous/anxious.         Objective:   Physical Exam  Constitutional: She is oriented to person, place, and time. She appears well-developed and well-nourished.  HENT:  Head: Normocephalic.  Right Ear: External ear normal.  Left Ear: External ear normal.  Mouth/Throat: Oropharynx is clear and moist.  Eyes: Conjunctivae and EOM are normal. Pupils are equal, round, and reactive to light.  Neck: Normal range of motion. Neck supple. No thyromegaly present.  Cardiovascular: Normal rate, regular rhythm, normal heart sounds and intact distal pulses.   Pulmonary/Chest: Effort normal and breath sounds normal.  Abdominal: Soft. Bowel sounds are normal. She exhibits no mass. There is no tenderness.  Musculoskeletal: Normal range of motion. She exhibits no edema and no tenderness.  Lymphadenopathy:    She has no cervical adenopathy.  Neurological: She is alert and oriented to person, place, and time.  Skin: Skin is warm and dry. No rash noted.  Psychiatric: She has a normal mood and affect. Her behavior is normal.          Assessment & Plan:   Nonspecific back hip and abdominal pain Low-grade fever Diabetes mellitus. Endocrinology Hypertension suboptimal control today with repeat blood pressure 160/90  We'll check screening labs Scheduled colonoscopy We'll check a urinalysis  Recheck one month

## 2011-06-03 NOTE — Patient Instructions (Signed)
Return in one month for followup  Schedule your colonoscopy to help detect colon cancer.

## 2011-06-08 ENCOUNTER — Other Ambulatory Visit (INDEPENDENT_AMBULATORY_CARE_PROVIDER_SITE_OTHER): Payer: 59

## 2011-06-08 ENCOUNTER — Encounter: Payer: Self-pay | Admitting: Endocrinology

## 2011-06-08 ENCOUNTER — Ambulatory Visit (INDEPENDENT_AMBULATORY_CARE_PROVIDER_SITE_OTHER): Payer: 59 | Admitting: Endocrinology

## 2011-06-08 VITALS — BP 136/70 | HR 96 | Temp 98.7°F | Ht 63.5 in | Wt 198.0 lb

## 2011-06-08 DIAGNOSIS — E119 Type 2 diabetes mellitus without complications: Secondary | ICD-10-CM

## 2011-06-08 LAB — HEMOGLOBIN A1C: Hgb A1c MFr Bld: 8.8 % — ABNORMAL HIGH (ref 4.6–6.5)

## 2011-06-08 MED ORDER — INSULIN LISPRO 100 UNIT/ML ~~LOC~~ SOLN: SUBCUTANEOUS | Status: DC

## 2011-06-08 NOTE — Progress Notes (Signed)
Subjective:    Patient ID: Laurie [name], female    DOB: 10-14-1945, 65 y.o.   MRN: [id number]  HPI pt states she feels well in general, except for her recent lateral rectus extraocular muscle palsy, for which she sees dr Hyacinth Meeker.  she brings a record of her cbg's which i have reviewed today.  It varies from 110-300.  It is in general higher as the day goes on.   Past Medical History  Diagnosis Date  . ALLERGIC RHINITIS 01/30/2007  . CORONARY ARTERY DISEASE 01/30/2007  . DIABETES MELLITUS, TYPE II 01/30/2007  . HYPERTENSION 01/30/2007  . HYPERLIPIDEMIA 01/30/2007  . Bell's palsy 01/05/2010  . GOITER, MULTINODULAR 05/14/2010  . HEPATITIS 12/06/2007  . Thyromegaly     mild    Past Surgical History  Procedure Date  . Coronary artery bypass graft 2000  . Tubal ligation   . Biopsy thyroid 05/2010    percutaneous    History   Social History  . Marital Status: Married    Spouse Name: N/A    Number of Children: 2  . Years of Education: N/A   Occupational History  .     Social History Main Topics  . Smoking status: Never Smoker   . Smokeless tobacco: Never Used  . Alcohol Use: No  . Drug Use: No  . Sexually Active: Not on file   Other Topics Concern  . Not on file   Social History Narrative   Does not work outside the home    Current Outpatient Prescriptions on File Prior to Visit  Medication Sig Dispense Refill  . ALLEGRA ALLERGY 180 MG tablet TAKE 1 TABLET DAILY  50 tablet  3  . azelastine (ASTELIN) 137 MCG/SPRAY nasal spray 2 sprays by Nasal route 2 (two) times daily. Use in each nostril as directed       . Calcium Carbonate (CALCIUM 500 PO) Take 1 tablet by mouth 2 (two) times daily.        . chlorthalidone (HYGROTON) 25 MG tablet Take 25 mg by mouth daily.        . Glucosamine-Chondroitin (OSTEO BI-FLEX REGULAR STRENGTH PO) Take 1 capsule by mouth daily.        [name] glucose blood (ACCU-CHEK COMPACT TEST DRUM) test strip Use four times a day       . insulin glargine  (LANTUS SOLOSTAR) 100 UNIT/ML injection Inject 60 Units into the skin at bedtime.  60 mL  3  . Insulin Pen Needle (B-D ULTRAFINE III SHORT PEN) 31G X 8 MM MISC Use  Qid and hs       . ketoconazole (NIZORAL) 2 % cream Apply topically as needed.        [name] lisinopril (PRINIVIL,ZESTRIL) 40 MG tablet Take 40 mg by mouth daily.        . metoprolol (TOPROL-XL) 50 MG 24 hr tablet Take 50 mg by mouth daily.        . Multiple Vitamin (MULTIVITAMIN) tablet Take 1 tablet by mouth daily.        . Naproxen Sodium (ALEVE) 220 MG CAPS Take by mouth. As needed for arthritis pain       . omeprazole (PRILOSEC) 20 MG capsule Take 1 capsule (20 mg total) by mouth daily.  30 capsule  11  . potassium chloride SA (K-DUR,KLOR-CON) 20 MEQ tablet TAKE 1 TABLET DAILY  90 tablet  3  . rosuvastatin (CRESTOR) 10 MG tablet 1 tablet twice weekly       .  triamcinolone (KENALOG) 0.025 % cream Apply topically 3 (three) times daily. Prn for rash  30 g  0  . TRICOR 145 MG tablet TAKE 1 TABLET DAILY  90 tablet  3    No Known Allergies  Family History  Problem Relation Age of Onset  . Diabetes Father   . Diabetes Other     Siblings and 2 children  . Heart disease Other     CAD    BP 136/70  Pulse 96  Temp(Src) 98.7 F (37.1 C) (Oral)  Ht 5' 3.5" (1.613 m)  Wt 198 lb (89.812 kg)  BMI 34.52 kg/m2  SpO2 98%  Review of Systems denies hypoglycemia    Objective:   Physical Exam VITAL SIGNS:  See vs page GENERAL: no distress SKIN: Insulin injection sites at the anterior abdomen are normal   Lab Results  Component Value Date   HGBA1C 8.8* 06/08/2011       Assessment & Plan:  Dm, characterized by severe insulin resistance.  She needs increased rx

## 2011-06-08 NOTE — Patient Instructions (Addendum)
check your blood sugar 4 times a day--before the 3 meals, and at bedtime.  also check if you have symptoms of your blood sugar being too high or too low.  please keep a record of the readings and bring it to your next appointment here.  please call us sooner if you are having low blood sugar episodes.   blood tests are being ordered for you today.  please call [phone] to hear your test results.  You will be prompted to enter the 9-digit "MRN" number that appears at the top left of this page, followed by #.  Then you will hear the message. pending the test results, please continue lantus 60 units at bedtime, and:  increase humalog to three times a day (just before each meal), 120-170-260 units. Please schedule a follow-up appointment in 6 weeks.   (update: i left message on phone-tree:  Increase humalog to 120-180-280 units).

## 2011-06-14 ENCOUNTER — Telehealth: Payer: Self-pay | Admitting: *Deleted

## 2011-06-14 NOTE — Telephone Encounter (Signed)
I have not seen any letter or lab request from a dr. Hyacinth Meeker - have you?

## 2011-06-14 NOTE — Telephone Encounter (Signed)
Not yet -will review all mail today

## 2011-06-14 NOTE — Telephone Encounter (Signed)
Pt called re: status from previous note. Pt has an eye patch on her eye and is unable to walk with patch on. Needs to get labs ordered per Dr Hyacinth Meeker. Pt req call back from nurse.

## 2011-06-14 NOTE — Telephone Encounter (Signed)
Pt is asking if Dr. Kirtland Bouchard received a letter from Dr. Hyacinth Meeker (eye doctor) re: her last visit.  There should be orders included that Dr. Hyacinth Meeker wanted her to have.....labs?  Please let her know.

## 2011-06-15 NOTE — Telephone Encounter (Signed)
If unable to find a letter please call Dr. Rondel Baton office for instructions

## 2011-06-15 NOTE — Telephone Encounter (Signed)
Pt said that she brought in the letter from Dr Garen Lah office on 06/08/11, and pt said that someone her a LBF told pt that the letter was at the [facility] and that it would be given to Dr Kirtland Bouchard. Pt has to be sch for the test that Dr Hyacinth Meeker had req pt to get done, in order to rule out various eye conditions. Very Important. Pt req call back today.

## 2011-06-15 NOTE — Telephone Encounter (Signed)
Spoke with pt - informed that dr.kwiatkowski nor myself have seen a letter from dr. Hyacinth Meeker within our [facility]. Asked her to have them fax it to [place]. KIK

## 2011-06-29 ENCOUNTER — Other Ambulatory Visit: Payer: Self-pay | Admitting: Internal Medicine

## 2011-07-09 ENCOUNTER — Ambulatory Visit (INDEPENDENT_AMBULATORY_CARE_PROVIDER_SITE_OTHER): Payer: 59 | Admitting: Internal Medicine

## 2011-07-09 ENCOUNTER — Encounter: Payer: Self-pay | Admitting: Internal Medicine

## 2011-07-09 DIAGNOSIS — H4920 Sixth [abducent] nerve palsy, unspecified eye: Secondary | ICD-10-CM

## 2011-07-09 DIAGNOSIS — I10 Essential (primary) hypertension: Secondary | ICD-10-CM

## 2011-07-09 DIAGNOSIS — E119 Type 2 diabetes mellitus without complications: Secondary | ICD-10-CM

## 2011-07-09 NOTE — Patient Instructions (Signed)
You need to lose weight.  Consider a lower calorie diet and regular exercise.   Please check your hemoglobin A1c every 3 months    It is important that you exercise regularly, at least 20 minutes 3 to 4 times per week.  If you develop chest pain or shortness of breath seek  medical attention.   

## 2011-07-09 NOTE — Progress Notes (Signed)
  Subjective:    Patient ID: Laurie [name], female    DOB: 1946-03-15, 64 y.o.   MRN: [id number]  HPI   65 year old patient who is seen today for followup of her hypertension. She has coronary artery disease and dyslipidemia. She is followed by endocrine for diabetes. Her diabetic control has improved. Her last hemoglobin A1c 8.8. She is scheduled for endocrine followup session. Since her last visit here she has developed diplopia secondary to a right lateral rectus muscle palsy. Otherwise she is doing well. She has had some intermittent back pain but today this has stabilized    Review of Systems  Constitutional: Negative.   HENT: Negative for hearing loss, congestion, sore throat, rhinorrhea, dental problem, sinus pressure and tinnitus.   Eyes: Negative for pain, discharge and visual disturbance.  Respiratory: Negative for cough and shortness of breath.   Cardiovascular: Negative for chest pain, palpitations and leg swelling.  Gastrointestinal: Negative for nausea, vomiting, abdominal pain, diarrhea, constipation, blood in stool and abdominal distention.  Genitourinary: Negative for dysuria, urgency, frequency, hematuria, flank pain, vaginal bleeding, vaginal discharge, difficulty urinating, vaginal pain and pelvic pain.  Musculoskeletal: Positive for back pain. Negative for joint swelling, arthralgias and gait problem.  Skin: Negative for rash.  Neurological: Negative for dizziness, syncope, speech difficulty, weakness, numbness and headaches.       Right lateral rectus muscle palsy with diplopia  Hematological: Negative for adenopathy.  Psychiatric/Behavioral: Negative for behavioral problems, dysphoric mood and agitation. The patient is not nervous/anxious.        Objective:   Physical Exam  Constitutional: She is oriented to person, place, and time. She appears well-developed and well-nourished.       Overweight. Weight 198. Repeat blood pressure 148/78  HENT:  Head:  Normocephalic.  Right Ear: External ear normal.  Left Ear: External ear normal.  Mouth/Throat: Oropharynx is clear and moist.  Eyes: Conjunctivae and EOM are normal. Pupils are equal, round, and reactive to light.       Right lateral rectus muscle palsy with inability to gaze to the right with  right  eye  Neck: Normal range of motion. Neck supple. No thyromegaly present.  Cardiovascular: Normal rate, regular rhythm, normal heart sounds and intact distal pulses.   Pulmonary/Chest: Effort normal and breath sounds normal.  Abdominal: Soft. Bowel sounds are normal. She exhibits no mass. There is no tenderness.  Musculoskeletal: Normal range of motion.  Lymphadenopathy:    She has no cervical adenopathy.  Neurological: She is alert and oriented to person, place, and time.  Skin: Skin is warm and dry. No rash noted.  Psychiatric: She has a normal mood and affect. Her behavior is normal.          Assessment & Plan:   Diabetic right lateral rectus muscle palsy with diplopia Hypertension Diabetes mellitus Dyslipidemia  Followup endocrinology and ophthalmology Return here in 3 months

## 2011-07-20 ENCOUNTER — Ambulatory Visit (INDEPENDENT_AMBULATORY_CARE_PROVIDER_SITE_OTHER): Payer: 59 | Admitting: Endocrinology

## 2011-07-20 ENCOUNTER — Encounter: Payer: Self-pay | Admitting: Endocrinology

## 2011-07-20 DIAGNOSIS — E119 Type 2 diabetes mellitus without complications: Secondary | ICD-10-CM

## 2011-07-20 NOTE — Progress Notes (Signed)
Subjective:    Patient ID: Laurie [name], female    DOB: [birth year], 65 y.o.   MRN: [id number]  HPI pt states she feels well in general, except her left eye is slow to improve. she brings a record of her cbg's which i have reviewed today.   It varies from 100-300, but most are in the high-100's.  It is most consistently high in am.  Since last ov, she has had only 1 hypoglycemic episode, before lunch, and that was mild.   Past Medical History  Diagnosis Date  . ALLERGIC RHINITIS 01/30/2007  . CORONARY ARTERY DISEASE 01/30/2007  . DIABETES MELLITUS, TYPE II 01/30/2007  . HYPERTENSION 01/30/2007  . HYPERLIPIDEMIA 01/30/2007  . Bell's palsy 01/05/2010  . GOITER, MULTINODULAR 05/14/2010  . HEPATITIS 12/06/2007  . Thyromegaly     mild    Past Surgical History  Procedure Date  . Coronary artery bypass graft 2000  . Tubal ligation   . Biopsy thyroid 05/2010    percutaneous    History   Social History  . Marital Status: Married    Spouse Name: N/A    Number of Children: 2  . Years of Education: N/A   Occupational History  .     Social History Main Topics  . Smoking status: Never Smoker   . Smokeless tobacco: Never Used  . Alcohol Use: No  . Drug Use: No  . Sexually Active: Not on file   Other Topics Concern  . Not on file   Social History Narrative   Does not work outside the home    Current Outpatient Prescriptions on File Prior to Visit  Medication Sig Dispense Refill  . ACCU-CHEK COMPACT TEST DRUM test strip USE FOUR TIMES A DAY  100 each  4  . ALLEGRA ALLERGY 180 MG tablet TAKE 1 TABLET DAILY  50 tablet  3  . azelastine (ASTELIN) 137 MCG/SPRAY nasal spray 2 sprays by Nasal route 2 (two) times daily. Use in each nostril as directed       . Calcium Carbonate (CALCIUM 500 PO) Take 1 tablet by mouth 2 (two) times daily.        . chlorthalidone (HYGROTON) 25 MG tablet TAKE 1 TABLET DAILY  90 tablet  3  . Glucosamine-Chondroitin (OSTEO BI-FLEX REGULAR STRENGTH PO) Take 1  capsule by mouth daily.        . insulin lispro (HUMALOG) 100 UNIT/ML injection Inject120 units with breakfast, 170 units with lunch, and 260 units with evening meal  540 mL  3  . Insulin Pen Needle (B-D ULTRAFINE III SHORT PEN) 31G X 8 MM MISC Use  Qid and hs       . ketoconazole (NIZORAL) 2 % cream Apply topically as needed.        [name] lisinopril (PRINIVIL,ZESTRIL) 40 MG tablet TAKE 1 TABLET DAILY  90 tablet  3  . metoprolol (TOPROL-XL) 50 MG 24 hr tablet TAKE 1 TABLET ONCE DAILY  90 tablet  3  . Multiple Vitamin (MULTIVITAMIN) tablet Take 1 tablet by mouth daily.        [name] omeprazole (PRILOSEC) 20 MG capsule Take 1 capsule (20 mg total) by mouth daily.  30 capsule  11  . potassium chloride SA (K-DUR,KLOR-CON) 20 MEQ tablet TAKE 1 TABLET DAILY  90 tablet  3  . triamcinolone (KENALOG) 0.025 % cream Apply topically 3 (three) times daily. Prn for rash  30 g  0  . TRICOR 145 MG tablet TAKE 1 TABLET  DAILY  90 tablet  3  . rosuvastatin (CRESTOR) 10 MG tablet 1 tablet twice weekly         No Known Allergies  Family History  Problem Relation Age of Onset  . Diabetes Father   . Diabetes Other     Siblings and 2 children  . Heart disease Other     CAD    BP 140/58  Pulse 92  Temp(Src) 98.2 F (36.8 C) (Oral)  Ht 5' 3.5" (1.613 m)  Wt 198 lb 2 oz (89.869 kg)  BMI 34.55 kg/m2  SpO2 95%    Review of Systems Denies loc    Objective:   Physical Exam VITAL SIGNS:  See vs page GENERAL: no distress Pulses: dorsalis pedis intact bilat.   Feet: no deformity.  no ulcer on the feet.  feet are of normal color and temp.  Trace bilat leg edema.  There is a healed vein harvest scar at the left leg. Neuro: sensation is intact to touch on the feet.       Assessment & Plan:  Dm, characterized by severe insulin resistance, needs increased rx

## 2011-07-20 NOTE — Patient Instructions (Addendum)
check your blood sugar 4 times a day--before the 3 meals, and at bedtime.  also check if you have symptoms of your blood sugar being too high or too low.  please keep a record of the readings and bring it to your next appointment here.  please call us sooner if you are having low blood sugar episodes.   blood tests are being ordered for you today.  please call [phone] to hear your test results.  You will be prompted to enter the 9-digit "MRN" number that appears at the top left of this page, followed by #.  Then you will hear the message. pending the test results, please continue lantus 70 units at bedtime, and:  increase humalog to three times a day (just before each meal), 120-180-280 units. Please schedule a follow-up appointment in 2 months.

## 2011-09-20 ENCOUNTER — Other Ambulatory Visit (INDEPENDENT_AMBULATORY_CARE_PROVIDER_SITE_OTHER): Payer: Medicare HMO

## 2011-09-20 ENCOUNTER — Ambulatory Visit (INDEPENDENT_AMBULATORY_CARE_PROVIDER_SITE_OTHER): Payer: Medicare HMO | Admitting: Endocrinology

## 2011-09-20 ENCOUNTER — Encounter: Payer: Self-pay | Admitting: Endocrinology

## 2011-09-20 VITALS — BP 126/60 | HR 93 | Temp 98.5°F | Ht 63.5 in | Wt 198.4 lb

## 2011-09-20 DIAGNOSIS — E119 Type 2 diabetes mellitus without complications: Secondary | ICD-10-CM

## 2011-09-20 MED ORDER — INSULIN LISPRO 100 UNIT/ML ~~LOC~~ SOLN: SUBCUTANEOUS | Status: DC

## 2011-09-20 MED ORDER — INSULIN GLARGINE 100 UNIT/ML ~~LOC~~ SOLN: 70.0000 [IU] | Freq: Every day | SUBCUTANEOUS | Status: DC

## 2011-09-20 NOTE — Progress Notes (Signed)
Subjective:    Patient ID: Laurie [name], female    DOB: July 05, 1946, 66 y.o.   MRN: [id number]  HPI Pt returns for f/u of insulin-requiring DM (1995), characterized by severe insulin resistance.  she brings a record of her cbg's which i have reviewed today.  It varies from 87-300.  There is no trend throughout the day.   Pt states 1 month of slight pain at the left lateral thigh.  No assoc numbness.  She is uncertain if this is related to an insulin injection, but she does not inject herself there.   Past Medical History  Diagnosis Date  . ALLERGIC RHINITIS 01/30/2007  . CORONARY ARTERY DISEASE 01/30/2007  . DIABETES MELLITUS, TYPE II 01/30/2007  . HYPERTENSION 01/30/2007  . HYPERLIPIDEMIA 01/30/2007  . Bell's palsy 01/05/2010  . GOITER, MULTINODULAR 05/14/2010  . HEPATITIS 12/06/2007  . Thyromegaly     mild    Past Surgical History  Procedure Date  . Coronary artery bypass graft 2000  . Tubal ligation   . Biopsy thyroid 05/2010    percutaneous    History   Social History  . Marital Status: Married    Spouse Name: N/A    Number of Children: 2  . Years of Education: N/A   Occupational History  .     Social History Main Topics  . Smoking status: Never Smoker   . Smokeless tobacco: Never Used  . Alcohol Use: No  . Drug Use: No  . Sexually Active: Not on file   Other Topics Concern  . Not on file   Social History Narrative   Does not work outside the home    Current Outpatient Prescriptions on File Prior to Visit  Medication Sig Dispense Refill  . ACCU-CHEK COMPACT TEST DRUM test strip USE FOUR TIMES A DAY  100 each  4  . ALLEGRA ALLERGY 180 MG tablet TAKE 1 TABLET DAILY  50 tablet  3  . azelastine (ASTELIN) 137 MCG/SPRAY nasal spray 2 sprays by Nasal route 2 (two) times daily. Use in each nostril as directed       . Calcium Carbonate (CALCIUM 500 PO) Take 1 tablet by mouth 2 (two) times daily.        . chlorthalidone (HYGROTON) 25 MG tablet TAKE 1 TABLET DAILY  90  tablet  3  . Glucosamine-Chondroitin (OSTEO BI-FLEX REGULAR STRENGTH PO) Take 1 capsule by mouth daily.        . Insulin Pen Needle (B-D ULTRAFINE III SHORT PEN) 31G X 8 MM MISC Use  Qid and hs       . ketoconazole (NIZORAL) 2 % cream Apply topically as needed.        [name] lisinopril (PRINIVIL,ZESTRIL) 40 MG tablet TAKE 1 TABLET DAILY  90 tablet  3  . metoprolol (TOPROL-XL) 50 MG 24 hr tablet TAKE 1 TABLET ONCE DAILY  90 tablet  3  . Multiple Vitamin (MULTIVITAMIN) tablet Take 1 tablet by mouth daily.        [name] omeprazole (PRILOSEC) 20 MG capsule Take 1 capsule (20 mg total) by mouth daily.  30 capsule  11  . potassium chloride SA (K-DUR,KLOR-CON) 20 MEQ tablet TAKE 1 TABLET DAILY  90 tablet  3  . rosuvastatin (CRESTOR) 10 MG tablet 1 tablet twice weekly       . triamcinolone (KENALOG) 0.025 % cream Apply topically 3 (three) times daily. Prn for rash  30 g  0  . TRICOR 145 MG tablet TAKE 1  TABLET DAILY  90 tablet  3    No Known Allergies  Family History  Problem Relation Age of Onset  . Diabetes Father   . Diabetes Other     Siblings and 2 children  . Heart disease Other     CAD    BP 126/60  Pulse 93  Temp(Src) 98.5 F (36.9 C) (Oral)  Ht 5' 3.5" (1.613 m)  Wt 198 lb 6.4 oz (89.994 kg)  BMI 34.59 kg/m2  SpO2 96%   Review of Systems denies hypoglycemia.      Objective:   Physical Exam VITAL SIGNS:  See vs page GENERAL: no distress Ext: left lateral thigh is normal to my exam  Lab Results  Component Value Date   HGBA1C 8.2* 09/20/2011      Assessment & Plan:  DM, needs increased rx, but cbg''s are too variable to increase insulin now. Thigh sxs, uncertain etiology.  Doesn't seem related to insulin injections

## 2011-09-20 NOTE — Patient Instructions (Addendum)
check your blood sugar 4 times a day--before the 3 meals, and at bedtime.  also check if you have symptoms of your blood sugar being too high or too low.  please keep a record of the readings and bring it to your next appointment here.  please call us sooner if you are having low blood sugar episodes.   blood tests are being ordered for you today.  please call [phone] to hear your test results.  You will be prompted to enter the 9-digit "MRN" number that appears at the top left of this page, followed by #.  Then you will hear the message. pending the test results, please continue the same insulins for now Please schedule a follow-up appointment in 3 months. (update: i left message on phone-tree:  rx as we discussed)

## 2011-09-27 ENCOUNTER — Other Ambulatory Visit: Payer: Self-pay | Admitting: *Deleted

## 2011-09-27 MED ORDER — INSULIN PEN NEEDLE 31G X 8 MM MISC: Status: DC

## 2011-09-27 NOTE — Telephone Encounter (Signed)
R'cd fax from [facility] for refill of Pen needles

## 2011-09-29 ENCOUNTER — Other Ambulatory Visit: Payer: Self-pay | Admitting: Endocrinology

## 2011-09-30 ENCOUNTER — Other Ambulatory Visit: Payer: Self-pay | Admitting: *Deleted

## 2011-09-30 MED ORDER — GLUCOSE BLOOD VI STRP: ORAL_STRIP | Status: DC

## 2011-09-30 MED ORDER — INSULIN GLARGINE 100 UNIT/ML ~~LOC~~ SOLN: 70.0000 [IU] | Freq: Every day | SUBCUTANEOUS | Status: DC

## 2011-09-30 MED ORDER — INSULIN LISPRO 100 UNIT/ML ~~LOC~~ SOLN: SUBCUTANEOUS | Status: DC

## 2011-09-30 MED ORDER — INSULIN PEN NEEDLE 31G X 8 MM MISC: Status: DC

## 2011-09-30 NOTE — Telephone Encounter (Signed)
Pt needs 14 day supply of Lantus and Humalog sent to [facility] until she receives her shipment from her mail order company.  Rx sent, pt informed.

## 2011-09-30 NOTE — Telephone Encounter (Signed)
Pt called back stating that she needs refills for test strips and pen needles sent to local pharmacy. Rx for pen needles and test strips sent [facility] and rx for test strips also sent to mail order pharmacy. Pt informed.

## 2011-10-04 ENCOUNTER — Telehealth: Payer: Self-pay | Admitting: *Deleted

## 2011-10-04 NOTE — Telephone Encounter (Signed)
Appt scheduled with Dr. Kirtland Bouchard tomorrow for fever, and sore throat.

## 2011-10-05 ENCOUNTER — Encounter: Payer: Self-pay | Admitting: Internal Medicine

## 2011-10-05 ENCOUNTER — Ambulatory Visit (INDEPENDENT_AMBULATORY_CARE_PROVIDER_SITE_OTHER): Payer: Medicare HMO | Admitting: Internal Medicine

## 2011-10-05 DIAGNOSIS — J069 Acute upper respiratory infection, unspecified: Secondary | ICD-10-CM

## 2011-10-05 DIAGNOSIS — E785 Hyperlipidemia, unspecified: Secondary | ICD-10-CM

## 2011-10-05 DIAGNOSIS — I10 Essential (primary) hypertension: Secondary | ICD-10-CM

## 2011-10-05 DIAGNOSIS — E119 Type 2 diabetes mellitus without complications: Secondary | ICD-10-CM

## 2011-10-05 MED ORDER — HYDROCODONE-HOMATROPINE 5-1.5 MG/5ML PO SYRP: 5.0000 mL | ORAL_SOLUTION | Freq: Four times a day (QID) | ORAL | Status: DC | PRN

## 2011-10-05 MED ORDER — HYDROCODONE-HOMATROPINE 5-1.5 MG/5ML PO SYRP: 5.0000 mL | ORAL_SOLUTION | Freq: Four times a day (QID) | ORAL | Status: AC | PRN

## 2011-10-05 NOTE — Progress Notes (Signed)
  Subjective:    Patient ID: Laurie [name], female    DOB: 08-30-1946, 66 y.o.   MRN: [id number]  HPI 66 year old patient who is in today for followup. She is now being followed by endocrine for diabetes. Hemoglobin A1c is trending down For the past 5 days she has had URI symptoms with hoarseness and cough both of which have improved she still has some fatigue and a scratchy sore throat. There's been no fever shortness of breath wheezing or purulent sputum production. She has treated hypertension and dyslipidemia. In general doing fairly well    Review of Systems  Constitutional: Negative.   HENT: Positive for sore throat, voice change and sinus pressure. Negative for hearing loss, congestion, rhinorrhea, dental problem and tinnitus.   Eyes: Negative for pain, discharge and visual disturbance.  Respiratory: Positive for cough. Negative for shortness of breath.   Cardiovascular: Negative for chest pain, palpitations and leg swelling.  Gastrointestinal: Negative for nausea, vomiting, abdominal pain, diarrhea, constipation, blood in stool and abdominal distention.  Genitourinary: Negative for dysuria, urgency, frequency, hematuria, flank pain, vaginal bleeding, vaginal discharge, difficulty urinating, vaginal pain and pelvic pain.  Musculoskeletal: Negative for joint swelling, arthralgias and gait problem.  Skin: Negative for rash.  Neurological: Negative for dizziness, syncope, speech difficulty, weakness, numbness and headaches.  Hematological: Negative for adenopathy.  Psychiatric/Behavioral: Negative for behavioral problems, dysphoric mood and agitation. The patient is not nervous/anxious.        Objective:   Physical Exam  Constitutional: She is oriented to person, place, and time. She appears well-developed and well-nourished.  HENT:  Head: Normocephalic.  Right Ear: External ear normal.  Left Ear: External ear normal.  Mouth/Throat: Oropharynx is clear and moist.  Eyes:  Conjunctivae and EOM are normal. Pupils are equal, round, and reactive to light.  Neck: Normal range of motion. Neck supple. No thyromegaly present.  Cardiovascular: Normal rate, regular rhythm, normal heart sounds and intact distal pulses.   Pulmonary/Chest: Effort normal and breath sounds normal.  Abdominal: Soft. Bowel sounds are normal. She exhibits no mass. There is no tenderness.  Musculoskeletal: Normal range of motion.  Lymphadenopathy:    She has no cervical adenopathy.  Neurological: She is alert and oriented to person, place, and time.  Skin: Skin is warm and dry. No rash noted.  Psychiatric: She has a normal mood and affect. Her behavior is normal.          Assessment & Plan:   Viral URI. Will treat symptomatically Diabetes mellitus-  modest improvement. Will continue aggressive multiple daily injections Hypertension well controlled Dyslipidemia

## 2011-10-05 NOTE — Patient Instructions (Signed)
Get plenty of rest, Drink lots of  clear liquids, and use Tylenol or ibuprofen for fever and discomfort.    Endocrinology followup  Limit your sodium (Salt) intake    It is important that you exercise regularly, at least 20 minutes 3 to 4 times per week.  If you develop chest pain or shortness of breath seek  medical attention.  Return in 6 months for follow-up

## 2011-10-11 ENCOUNTER — Ambulatory Visit: Payer: 59 | Admitting: Internal Medicine

## 2011-10-20 ENCOUNTER — Other Ambulatory Visit: Payer: Self-pay | Admitting: Internal Medicine

## 2011-10-20 NOTE — Telephone Encounter (Signed)
Pt has changed mail order pharamcies and is needing new scripts for Azelastine nasal spray, chlorthalidone (HYGROTON) 25 MG tablet,  ketoconazole (NIZORAL) 2 % cream ,lisinopril (PRINIVIL,ZESTRIL) 40 MG tablet ,metoprolol (TOPROL-XL) 50 MG 24 hr tablet,  omeprazole (PRILOSEC) 20 MG capsule ,potassium chloride SA (K-DUR,KLOR-CON) 20 MEQ tablet, TRICOR 145 MG tablet. Pls print scripts and pt will pick up when ready. Pt now uses [facility], but pt has to send them in herself.

## 2011-10-22 MED ORDER — KETOCONAZOLE 2 % EX CREA: TOPICAL_CREAM | CUTANEOUS | Status: DC | PRN

## 2011-10-22 MED ORDER — OMEPRAZOLE 20 MG PO CPDR: 20.0000 mg | DELAYED_RELEASE_CAPSULE | Freq: Every day | ORAL | Status: DC

## 2011-10-22 MED ORDER — CHLORTHALIDONE 25 MG PO TABS: 25.0000 mg | ORAL_TABLET | Freq: Every day | ORAL | Status: DC

## 2011-10-22 MED ORDER — POTASSIUM CHLORIDE CRYS ER 20 MEQ PO TBCR: 20.0000 meq | EXTENDED_RELEASE_TABLET | Freq: Every day | ORAL | Status: DC

## 2011-10-22 MED ORDER — FENOFIBRATE 145 MG PO TABS: 145.0000 mg | ORAL_TABLET | Freq: Every day | ORAL | Status: DC

## 2011-10-22 MED ORDER — AZELASTINE HCL 0.1 % NA SOLN: 2.0000 | Freq: Two times a day (BID) | NASAL | Status: DC

## 2011-10-22 MED ORDER — METOPROLOL SUCCINATE ER 50 MG PO TB24: 50.0000 mg | ORAL_TABLET | Freq: Every day | ORAL | Status: DC

## 2011-10-22 MED ORDER — LISINOPRIL 40 MG PO TABS: 40.0000 mg | ORAL_TABLET | Freq: Every day | ORAL | Status: DC

## 2011-10-22 NOTE — Telephone Encounter (Signed)
Pt aware rx's ready for pick up  

## 2011-10-27 ENCOUNTER — Telehealth: Payer: Self-pay | Admitting: *Deleted

## 2011-10-27 DIAGNOSIS — Z0279 Encounter for issue of other medical certificate: Secondary | ICD-10-CM

## 2011-10-27 NOTE — Telephone Encounter (Signed)
Pt called on status of PA for Humalog insulin. Pt states that she is almost out of insulin and needs PA filled out as soon as possible. Pt informed that we have received PA and that we will contact her once PA is faxed to insurance company and once we receive decision from [employer].

## 2011-10-27 NOTE — Telephone Encounter (Signed)
Form done 

## 2011-10-28 NOTE — Telephone Encounter (Signed)
PA faxed to insurance company.

## 2011-11-02 ENCOUNTER — Other Ambulatory Visit: Payer: Self-pay | Admitting: Endocrinology

## 2011-11-02 MED ORDER — INSULIN LISPRO 100 UNIT/ML ~~LOC~~ SOLN: SUBCUTANEOUS | Status: DC

## 2011-11-02 NOTE — Telephone Encounter (Signed)
PA approved through 10/28/2012.

## 2011-11-02 NOTE — Telephone Encounter (Signed)
The pt called and is hoping to get 3 month supply for Humalog sent through [facility].  [phone]

## 2011-11-02 NOTE — Telephone Encounter (Signed)
Rx sent to [facility] again for Humalog insulin.

## 2011-11-05 ENCOUNTER — Encounter: Payer: Self-pay | Admitting: Endocrinology

## 2011-11-05 ENCOUNTER — Ambulatory Visit (INDEPENDENT_AMBULATORY_CARE_PROVIDER_SITE_OTHER): Payer: Medicare HMO | Admitting: Endocrinology

## 2011-11-05 DIAGNOSIS — E119 Type 2 diabetes mellitus without complications: Secondary | ICD-10-CM

## 2011-11-05 MED ORDER — INSULIN LISPRO 100 UNIT/ML ~~LOC~~ SOLN: SUBCUTANEOUS | Status: DC

## 2011-11-05 NOTE — Progress Notes (Signed)
Subjective:    Patient ID: Laurie [name], female    DOB: December 18, 1945, 66 y.o.   MRN: [id number]  HPI Pt returns for f/u of insulin-requiring DM (1995), characterized by severe insulin resistance.  she brings a record of her cbg's which i have reviewed today.  It varies from 92-277.  There is no trend throughout the day.  Past Medical History  Diagnosis Date  . ALLERGIC RHINITIS 01/30/2007  . CORONARY ARTERY DISEASE 01/30/2007  . DIABETES MELLITUS, TYPE II 01/30/2007  . HYPERTENSION 01/30/2007  . HYPERLIPIDEMIA 01/30/2007  . Bell's palsy 01/05/2010  . GOITER, MULTINODULAR 05/14/2010  . HEPATITIS 12/06/2007  . Thyromegaly     mild    Past Surgical History  Procedure Date  . Coronary artery bypass graft 2000  . Tubal ligation   . Biopsy thyroid 05/2010    percutaneous    History   Social History  . Marital Status: Married    Spouse Name: N/A    Number of Children: 2  . Years of Education: N/A   Occupational History  .     Social History Main Topics  . Smoking status: Never Smoker   . Smokeless tobacco: Never Used  . Alcohol Use: No  . Drug Use: No  . Sexually Active: Not on file   Other Topics Concern  . Not on file   Social History Narrative   Does not work outside the home    Current Outpatient Prescriptions on File Prior to Visit  Medication Sig Dispense Refill  . ALLEGRA ALLERGY 180 MG tablet TAKE 1 TABLET DAILY  50 tablet  3  . azelastine (ASTELIN) 137 MCG/SPRAY nasal spray Place 2 sprays into the nose 2 (two) times daily. Use in each nostril as directed  30 mL  3  . Calcium Carbonate (CALCIUM 500 PO) Take 1 tablet by mouth 2 (two) times daily.        . chlorthalidone (HYGROTON) 25 MG tablet Take 1 tablet (25 mg total) by mouth daily.  90 tablet  3  . fenofibrate (TRICOR) 145 MG tablet Take 1 tablet (145 mg total) by mouth daily.  90 tablet  3  . Glucosamine-Chondroitin (OSTEO BI-FLEX REGULAR STRENGTH PO) Take 1 capsule by mouth daily.        [name] glucose blood  (ACCU-CHEK COMPACT TEST DRUM) test strip Use as instructed to check blood sugar 4 times daily  400 each  3  . insulin glargine (LANTUS SOLOSTAR) 100 UNIT/ML injection Inject 70 Units into the skin at bedtime.  30 mL  0  . Insulin Pen Needle (B-D ULTRAFINE III SHORT PEN) 31G X 8 MM MISC Use as directed four times daily  120 each  1  . ketoconazole (NIZORAL) 2 % cream Apply topically as needed.  15 g  3  . lisinopril (PRINIVIL,ZESTRIL) 40 MG tablet Take 1 tablet (40 mg total) by mouth daily.  90 tablet  3  . metoprolol succinate (TOPROL-XL) 50 MG 24 hr tablet Take 1 tablet (50 mg total) by mouth daily. Take with or immediately following a meal.  90 tablet  3  . Multiple Vitamin (MULTIVITAMIN) tablet Take 1 tablet by mouth daily.        [name] omeprazole (PRILOSEC) 20 MG capsule Take 1 capsule (20 mg total) by mouth daily.  90 capsule  3  . potassium chloride SA (K-DUR,KLOR-CON) 20 MEQ tablet Take 1 tablet (20 mEq total) by mouth daily.  90 tablet  3  . triamcinolone (KENALOG) 0.025 %  cream Apply topically 3 (three) times daily. Prn for rash  30 g  0    No Known Allergies  Family History  Problem Relation Age of Onset  . Diabetes Father   . Diabetes Other     Siblings and 2 children  . Heart disease Other     CAD    BP 158/78  Pulse 89  Temp(Src) 98.7 F (37.1 C) (Oral)  Ht 5' 3.5" (1.613 m)  Wt 197 lb 6.4 oz (89.54 kg)  BMI 34.42 kg/m2  SpO2 94%  Review of Systems denies hypoglycemia    Objective:   Physical Exam VITAL SIGNS:  See vs page GENERAL: no distress SKIN:  Insulin injection sites at the anterior abdomen are normal   Lab Results  Component Value Date   HGBA1C 8.2* 09/20/2011      Assessment & Plan:  We can't increase insulin, due to variable cbg's.

## 2011-11-05 NOTE — Patient Instructions (Addendum)
check your blood sugar 4 times a day--before the 3 meals, and at bedtime.  also check if you have symptoms of your blood sugar being too high or too low.  please keep a record of the readings and bring it to your next appointment here.  please call us sooner if you are having low blood sugar episodes.   blood tests are being ordered for you today.  please call [phone] to hear your test results.  You will be prompted to enter the 9-digit "MRN" number that appears at the top left of this page, followed by #.  Then you will hear the message. pending the test results, please continue the same insulins for now. Please schedule a follow-up appointment in 2 months.

## 2011-12-20 ENCOUNTER — Encounter: Payer: Self-pay | Admitting: Endocrinology

## 2011-12-20 ENCOUNTER — Other Ambulatory Visit (INDEPENDENT_AMBULATORY_CARE_PROVIDER_SITE_OTHER): Payer: Medicare HMO

## 2011-12-20 ENCOUNTER — Ambulatory Visit (INDEPENDENT_AMBULATORY_CARE_PROVIDER_SITE_OTHER): Payer: Medicare HMO | Admitting: Endocrinology

## 2011-12-20 VITALS — BP 144/72 | HR 92 | Temp 98.3°F | Wt 197.0 lb

## 2011-12-20 DIAGNOSIS — E119 Type 2 diabetes mellitus without complications: Secondary | ICD-10-CM

## 2011-12-20 MED ORDER — INSULIN LISPRO 100 UNIT/ML ~~LOC~~ SOLN: SUBCUTANEOUS | Status: DC

## 2011-12-20 MED ORDER — INSULIN GLARGINE 100 UNIT/ML ~~LOC~~ SOLN: 50.0000 [IU] | Freq: Every day | SUBCUTANEOUS | Status: DC

## 2011-12-20 NOTE — Patient Instructions (Addendum)
check your blood sugar 4 times a day--before the 3 meals, and at bedtime.  also check if you have symptoms of your blood sugar being too high or too low.  please keep a record of the readings and bring it to your next appointment here.  please call us sooner if you are having low blood sugar episodes.   Reduce lantus to 50 units at bedtime blood tests are being requested for you today.  You will receive a letter with results. Please come back for a follow-up appointment in 3 months

## 2011-12-20 NOTE — Progress Notes (Signed)
Subjective:    Patient ID: Laurie [name], female    DOB: [birth year], 66 y.o.   MRN: [id number]  HPI Pt returns for f/u of insulin-requiring DM (1995), characterized by severe insulin resistance.  She had an episode of hypoglycemia at 3 am (44).  She also had cbg of 60 in the afternoon.  he brings a record of his cbg's which i have reviewed today.   Past Medical History  Diagnosis Date  . ALLERGIC RHINITIS 01/30/2007  . CORONARY ARTERY DISEASE 01/30/2007  . DIABETES MELLITUS, TYPE II 01/30/2007  . HYPERTENSION 01/30/2007  . HYPERLIPIDEMIA 01/30/2007  . Bell's palsy 01/05/2010  . GOITER, MULTINODULAR 05/14/2010  . HEPATITIS 12/06/2007  . Thyromegaly     mild    Past Surgical History  Procedure Date  . Coronary artery bypass graft 2000  . Tubal ligation   . Biopsy thyroid 05/2010    percutaneous    History   Social History  . Marital Status: Married    Spouse Name: N/A    Number of Children: 2  . Years of Education: N/A   Occupational History  .     Social History Main Topics  . Smoking status: Never Smoker   . Smokeless tobacco: Never Used  . Alcohol Use: No  . Drug Use: No  . Sexually Active: Not on file   Other Topics Concern  . Not on file   Social History Narrative   Does not work outside the home    Current Outpatient Prescriptions on File Prior to Visit  Medication Sig Dispense Refill  . ALLEGRA ALLERGY 180 MG tablet TAKE 1 TABLET DAILY  50 tablet  3  . azelastine (ASTELIN) 137 MCG/SPRAY nasal spray Place 2 sprays into the nose 2 (two) times daily. Use in each nostril as directed  30 mL  3  . Calcium Carbonate-Vitamin D (CALTRATE 600+D PO) Take 1 tablet by mouth daily.      . chlorthalidone (HYGROTON) 25 MG tablet Take 1 tablet (25 mg total) by mouth daily.  90 tablet  3  . fenofibrate (TRICOR) 145 MG tablet Take 1 tablet (145 mg total) by mouth daily.  90 tablet  3  . Glucosamine-Chondroitin (OSTEO BI-FLEX REGULAR STRENGTH PO) Take 1 capsule by mouth daily.         [name] glucose blood (ACCU-CHEK COMPACT TEST DRUM) test strip Use as instructed to check blood sugar 4 times daily  400 each  3  . insulin glargine (LANTUS) 100 UNIT/ML injection Inject 50 Units into the skin at bedtime.  50 mL  3  . insulin lispro (HUMALOG KWIKPEN) 100 UNIT/ML injection 120 units with breakfast, 180 units with lunch, and 280 units with evening meal  600 mL  3  . Insulin Pen Needle (B-D ULTRAFINE III SHORT PEN) 31G X 8 MM MISC Use as directed four times daily  120 each  1  . ketoconazole (NIZORAL) 2 % cream Apply topically as needed.  15 g  3  . lisinopril (PRINIVIL,ZESTRIL) 40 MG tablet Take 1 tablet (40 mg total) by mouth daily.  90 tablet  3  . metoprolol succinate (TOPROL-XL) 50 MG 24 hr tablet Take 1 tablet (50 mg total) by mouth daily. Take with or immediately following a meal.  90 tablet  3  . Multiple Vitamin (MULTIVITAMIN) tablet Take 1 tablet by mouth daily.        [name] omeprazole (PRILOSEC) 20 MG capsule Take 1 capsule (20 mg total) by mouth daily.  90 capsule  3  . potassium chloride SA (K-DUR,KLOR-CON) 20 MEQ tablet Take 1 tablet (20 mEq total) by mouth daily.  90 tablet  3  . triamcinolone (KENALOG) 0.025 % cream Apply topically 3 (three) times daily. Prn for rash  30 g  0    No Known Allergies  Family History  Problem Relation Age of Onset  . Diabetes Father   . Diabetes Other     Siblings and 2 children  . Heart disease Other     CAD    BP 144/72  Pulse 92  Temp(Src) 98.3 F (36.8 C) (Oral)  Wt 197 lb (89.359 kg)  SpO2 98%   Review of Systems Denies loc    Objective:   Physical Exam Pulses: dorsalis pedis intact bilat.   Feet: no deformity.  no ulcer on the feet.  feet are of normal color and temp.  Trace bilat leg edema.  There is a healed vein harvest scar at the left leg. Neuro: sensation is intact to touch on the feet.     Assessment & Plan:  DM, apparently slightly overcontrolled

## 2011-12-21 ENCOUNTER — Telehealth: Payer: Self-pay | Admitting: *Deleted

## 2011-12-21 NOTE — Telephone Encounter (Signed)
Triage v/m Pt wanted Dr Kirtland Bouchard to know that he is going to be getting a fax for pt to get back brace. Pt would like Dr Kirtland Bouchard to approve it.

## 2011-12-21 NOTE — Telephone Encounter (Signed)
Please advise - ok to rx back brace or refer to ortho?

## 2011-12-22 ENCOUNTER — Telehealth: Payer: Self-pay | Admitting: Internal Medicine

## 2011-12-22 NOTE — Telephone Encounter (Signed)
Please call and schedule rov to discuss fax recv'd for back brace - per dr. Amador Cunas needs rov to eval why needed

## 2011-12-22 NOTE — Telephone Encounter (Signed)
Pt is scheduled °

## 2011-12-23 ENCOUNTER — Encounter: Payer: Self-pay | Admitting: Endocrinology

## 2011-12-24 ENCOUNTER — Telehealth: Payer: Self-pay | Admitting: *Deleted

## 2011-12-24 NOTE — Telephone Encounter (Signed)
Called pt to inform of A1c results, pt informed (letter also mailed to pt). 

## 2011-12-27 ENCOUNTER — Encounter: Payer: Self-pay | Admitting: Internal Medicine

## 2011-12-27 ENCOUNTER — Ambulatory Visit (INDEPENDENT_AMBULATORY_CARE_PROVIDER_SITE_OTHER): Payer: Medicare HMO | Admitting: Internal Medicine

## 2011-12-27 VITALS — BP 160/90 | Ht 63.5 in | Wt 200.0 lb

## 2011-12-27 DIAGNOSIS — E119 Type 2 diabetes mellitus without complications: Secondary | ICD-10-CM

## 2011-12-27 DIAGNOSIS — M549 Dorsalgia, unspecified: Secondary | ICD-10-CM

## 2011-12-27 DIAGNOSIS — I10 Essential (primary) hypertension: Secondary | ICD-10-CM

## 2011-12-27 NOTE — Progress Notes (Signed)
  Subjective:    Patient ID: Laurie [name], female    DOB: July 28, 1946, 66 y.o.   MRN: [id number]  HPI  66 year old patient who is seen today for evaluation of low back pain. She states as she has had pain in lumbar area intermittently since August or September of last year pain is maximal in the lumbar area but often involves the back up to the infrascapular region. Pain is aggravated by bending lifting and twisting. She has a family member who benefited greatly by lumbar support and she wishes to consider a back brace. She is seen here at to evaluate her low back pain. She denies any radicular symptoms. Denies any motor weakness.   Review of Systems  Musculoskeletal: Positive for back pain.       Objective:   Physical Exam  Constitutional: She appears well-developed and well-nourished. No distress.       Repeat blood pressure 140/84 Weight 200 pounds  Musculoskeletal:       Slight tenderness in the lumbar area The lumbar musculature was quite tight and tense Straight leg testing was negative Neurovascular structures intact          Assessment & Plan:   Low back pain  Information concerning low back pain dispensed;  will give a trial of a lumbar brace. She'll call if unimproved

## 2011-12-27 NOTE — Patient Instructions (Signed)
Most patients with low back pain will improve with time over the next two to 6 weeks.  Keep active but avoid any activities that cause pain.  Apply moist heat to the low back area several times daily. Chronic Back Pain When back pain lasts longer than 3 months, it is called chronic back pain. This pain can be frustrating, but the cause of the pain is rarely dangerous. People with chronic back pain often go through certain periods that are more intense (flare-ups). CAUSES Chronic back pain can be caused by wear and tear (degeneration) on different structures in your back. These structures may include bones, ligaments, or discs. This degeneration may result in more pressure being placed on the nerves that travel to your legs and feet. This can lead to pain traveling from the low back down the back of the legs. When pain lasts longer than 3 months, it is not unusual for people to experience anxiety or depression. Anxiety and depression can also contribute to low back pain. TREATMENT  Establish a regular exercise plan. This is critical to improving your functional level.   Have a self-management plan for when you flare-up. Flare-ups rarely require a medical visit. Regular exercise will help reduce the intensity and frequency of your flare-ups.   Manage how you feel about your back pain and the rest of your life. Anxiety, depression, and feeling that you cannot alter your back pain have been shown to make back pain more intense and debilitating.   Medicines should never be your only treatment. They should be used along with other treatments to help you return to a more active lifestyle.   Procedures such as injections or surgery may be helpful but are rarely necessary. You may be able to get the same results with physical therapy or chiropractic care.  HOME CARE INSTRUCTIONS  Avoid bending, heavy lifting, prolonged sitting, and activities which make the problem worse.   Continue normal activity as  much as possible.   Take brief periods of rest throughout the day to reduce your pain during flare-ups.   Follow your back exercise rehabilitation program. This can help reduce symptoms and prevent more pain.   Only take over-the-counter or prescription medicines as directed by your caregiver. Muscle relaxants are sometimes prescribed. Narcotic pain medicine is discouraged for long-term pain, since addiction is a possible outcome.   If you smoke, quit.   Eat healthy foods and maintain a recommended body weight.  SEEK IMMEDIATE MEDICAL CARE IF:    You have weakness or numbness in one of your legs or feet.   You have trouble controlling your bladder or bowels.   You develop nausea, vomiting, abdominal pain, shortness of breath, or fainting.  Document Released: 10/07/2004 Document Revised: 08/19/2011 Document Reviewed: 08/14/2011 [facility] Patient Information 2012 [place], [place].

## 2012-03-02 ENCOUNTER — Telehealth: Payer: Self-pay | Admitting: Endocrinology

## 2012-03-02 NOTE — Telephone Encounter (Signed)
Spoke with pt- per dr. Amador Cunas - to be seen tomorrow - appt made for 1015am - instructed to bring all meds on , also discussed s/s to go to Er tonight if worse

## 2012-03-02 NOTE — Telephone Encounter (Signed)
Caller: Nupur/Patient; PCP: Romero Belling; CB#: [phone]; Calling today 03/02/12 regarding her blood sugar has started going up.  Was seen in urgent care on 02/16/12 for back and leg problems.  Has recently been dx with R/A.  Feels weak in knees in both legs.  Can't walk long periods of time without having to sit down.  Not sleeping well at night, can't sit and stand for long periods of time.  X-ray's were done at urgent care and dx then with R/A.  Was prescribed Prednisone 20 mg.  Blood sugars have been going up since then.  Heart rate has been going up and then coming back down.  No heart racing today. Has not taken Prednisone since 02/24/12. She has an appt scheduled at office for 03/24/12 but was not sure she should wait that long. Blood sugar this AM before breakfast was 313.  Pt takes Humalog 130 units in AM, lunch time takes 190 units of Humalog, and then at supper takes 290 Units of Humalog.  Takes 50 Units of Lantis at HS.  Emergent symptoms r/o by Diabetes:  Control Problems guidelines with exception of new or increasing symptoms or glucose out of control as defined by provider or action plan and taking medications, following therapy as prescribed.  Care advice given. Advised pt office will be calling her back with appt time at [phone].  OFFICE PLEASE CALL PT BACK TO SCHEDULE APPT.  TRIAGED TO BE SEEN WITHIN 4 HOURS, HOWEVER NO APPTS AVAILABLE IN [facility].

## 2012-03-02 NOTE — Telephone Encounter (Signed)
For scheduled office visit for 6-21

## 2012-03-02 NOTE — Telephone Encounter (Signed)
Please advise - Dr. Everardo All out of office until 03/06/12

## 2012-03-03 ENCOUNTER — Ambulatory Visit (INDEPENDENT_AMBULATORY_CARE_PROVIDER_SITE_OTHER): Payer: Medicare HMO | Admitting: Internal Medicine

## 2012-03-03 ENCOUNTER — Encounter: Payer: Self-pay | Admitting: Internal Medicine

## 2012-03-03 VITALS — BP 152/80 | Temp 98.0°F | Wt 199.0 lb

## 2012-03-03 DIAGNOSIS — E119 Type 2 diabetes mellitus without complications: Secondary | ICD-10-CM

## 2012-03-03 DIAGNOSIS — I10 Essential (primary) hypertension: Secondary | ICD-10-CM

## 2012-03-03 MED ORDER — INSULIN LISPRO 100 UNIT/ML ~~LOC~~ SOLN: SUBCUTANEOUS | Status: DC

## 2012-03-03 NOTE — Progress Notes (Signed)
  Subjective:    Patient ID: Laurie [name], female    DOB: May 02, 1946, 66 y.o.   MRN: [id number]  HPI  66 year old patient who has a history of type 2 diabetes. She is in today do to worsening glycemic control. Her in a month she was treated with a prednisone Dosepak but this has been completed for several days. Her endocrinologist is presently out of town. For the past week or so she has had frequent blood sugars occasionally over 300. She remains on Lantus 50 units at bedtime which was down titrated from 70 units do to nocturnal hypoglycemia. She is taking Humalog 130 units prior to breakfast 190 and is prior to lunch and 290 units prior to her evening meal    Review of Systems  Constitutional: Negative.   HENT: Negative for hearing loss, congestion, sore throat, rhinorrhea, dental problem, sinus pressure and tinnitus.   Eyes: Negative for pain, discharge and visual disturbance.  Respiratory: Negative for cough and shortness of breath.   Cardiovascular: Negative for chest pain, palpitations and leg swelling.  Gastrointestinal: Negative for nausea, vomiting, abdominal pain, diarrhea, constipation, blood in stool and abdominal distention.  Genitourinary: Negative for dysuria, urgency, frequency, hematuria, flank pain, vaginal bleeding, vaginal discharge, difficulty urinating, vaginal pain and pelvic pain.  Musculoskeletal: Negative for joint swelling, arthralgias and gait problem.  Skin: Negative for rash.  Neurological: Negative for dizziness, syncope, speech difficulty, weakness, numbness and headaches.  Hematological: Negative for adenopathy.  Psychiatric/Behavioral: Negative for behavioral problems, dysphoric mood and agitation. The patient is not nervous/anxious.        Objective:   Physical Exam  Constitutional: She appears well-developed and well-nourished. No distress.       No distress normal blood pressure          Assessment & Plan:    diabetes mellitus.  Prednisone  Dosepak has been completed but probably still having some metabolic effects. We'll continue present regimen but we'll add an additional 30 units at mealtime insulin for blood sugars in excess of 300. She will followup with her endocrinologist next week Hypertension stable

## 2012-03-03 NOTE — Patient Instructions (Addendum)
Please check your hemoglobin A1c every 3 months  Follow Dr. Everardo All  Call or return to clinic prn if these symptoms worsen or fail to improve as anticipated.

## 2012-03-07 ENCOUNTER — Telehealth: Payer: Self-pay | Admitting: Endocrinology

## 2012-03-07 NOTE — Telephone Encounter (Signed)
Caller: Laurie [name]/Patient; PCP: Romero Belling; CB#: [phone]; Call regarding FBS 357mg ; Was on Prednisone earlier this month until 6/13.  No sliding scale given.  Takes Humalog 130u at breakfast, 190u at lunch, 290u at supper and then Lantis 50 u at hs.   Concerned that her sugars are staying elevated.  Has a new dx or RA.  Has pain in her hips and legs with weakness.    Her blood sugar this afternoon. 260mg .   Note to MD.  Instructed pt. to drink plenty of fluids.

## 2012-03-08 NOTE — Telephone Encounter (Signed)
Pt informed of MD's advisement, transferred to scheduler to set up appointment. 

## 2012-03-08 NOTE — Telephone Encounter (Signed)
Ov within the next few days

## 2012-03-08 NOTE — Telephone Encounter (Signed)
Appointment scheduled 03/10/2012 11:15am.

## 2012-03-10 ENCOUNTER — Ambulatory Visit (INDEPENDENT_AMBULATORY_CARE_PROVIDER_SITE_OTHER): Payer: Medicare HMO | Admitting: Endocrinology

## 2012-03-10 ENCOUNTER — Other Ambulatory Visit (INDEPENDENT_AMBULATORY_CARE_PROVIDER_SITE_OTHER): Payer: Medicare HMO

## 2012-03-10 ENCOUNTER — Encounter: Payer: Self-pay | Admitting: Endocrinology

## 2012-03-10 VITALS — BP 130/78 | HR 96 | Temp 97.1°F | Wt 203.0 lb

## 2012-03-10 DIAGNOSIS — E119 Type 2 diabetes mellitus without complications: Secondary | ICD-10-CM

## 2012-03-10 LAB — HEMOGLOBIN A1C: Hgb A1c MFr Bld: 8.4 % — ABNORMAL HIGH (ref 4.6–6.5)

## 2012-03-10 NOTE — Progress Notes (Signed)
Subjective:    Patient ID: Laurie [name], female    DOB: 1945/11/12, 66 y.o.   MRN: [id number]  HPI Pt returns for f/u of insulin-requiring DM (dx'ed 1995; complicated by CAD), characterized by severe insulin resistance.  She was recently rx'ed prednisone for arthralgias.  She has been off the prednisone x 2 weeks. she brings a record of her cbg's which i have reviewed today.  It varies from 100-300.  It is most persistently elevated in am. Past Medical History  Diagnosis Date  . ALLERGIC RHINITIS 01/30/2007  . CORONARY ARTERY DISEASE 01/30/2007  . DIABETES MELLITUS, TYPE II 01/30/2007  . HYPERTENSION 01/30/2007  . HYPERLIPIDEMIA 01/30/2007  . Bell's palsy 01/05/2010  . GOITER, MULTINODULAR 05/14/2010  . HEPATITIS 12/06/2007  . Thyromegaly     mild    Past Surgical History  Procedure Date  . Coronary artery bypass graft 2000  . Tubal ligation   . Biopsy thyroid 05/2010    percutaneous    History   Social History  . Marital Status: Married    Spouse Name: N/A    Number of Children: 2  . Years of Education: N/A   Occupational History  .     Social History Main Topics  . Smoking status: Never Smoker   . Smokeless tobacco: Never Used  . Alcohol Use: No  . Drug Use: No  . Sexually Active: Not on file   Other Topics Concern  . Not on file   Social History Narrative   Does not work outside the home    Current Outpatient Prescriptions on File Prior to Visit  Medication Sig Dispense Refill  . ALLEGRA ALLERGY 180 MG tablet TAKE 1 TABLET DAILY  50 tablet  3  . azelastine (ASTELIN) 137 MCG/SPRAY nasal spray Place 2 sprays into the nose 2 (two) times daily. Use in each nostril as directed  30 mL  3  . chlorthalidone (HYGROTON) 25 MG tablet Take 1 tablet (25 mg total) by mouth daily.  90 tablet  3  . fenofibrate (TRICOR) 145 MG tablet Take 1 tablet (145 mg total) by mouth daily.  90 tablet  3  . glucose blood (ACCU-CHEK COMPACT TEST DRUM) test strip Use as instructed to check  blood sugar 4 times daily  400 each  3  . insulin glargine (LANTUS) 100 UNIT/ML injection Inject 60 Units into the skin at bedtime.      . insulin lispro (HUMALOG) 100 UNIT/ML injection 130 units with breakfast, 190 units with lunch, and 290 units with evening meal- add  30 additional units if blood sugar is greater than 300      . Insulin Pen Needle (B-D ULTRAFINE III SHORT PEN) 31G X 8 MM MISC Use as directed four times daily  120 each  1  . ketoconazole (NIZORAL) 2 % cream Apply topically as needed.  15 g  3  . lisinopril (PRINIVIL,ZESTRIL) 40 MG tablet Take 1 tablet (40 mg total) by mouth daily.  90 tablet  3  . metoprolol succinate (TOPROL-XL) 50 MG 24 hr tablet Take 1 tablet (50 mg total) by mouth daily. Take with or immediately following a meal.  90 tablet  3  . Multiple Vitamin (MULTIVITAMIN) tablet Take 1 tablet by mouth daily.        [name] omeprazole (PRILOSEC) 20 MG capsule Take 1 capsule (20 mg total) by mouth daily.  90 capsule  3  . potassium chloride SA (K-DUR,KLOR-CON) 20 MEQ tablet Take 1 tablet (20 mEq  total) by mouth daily.  90 tablet  3    No Known Allergies  Family History  Problem Relation Age of Onset  . Diabetes Father   . Diabetes Other     Siblings and 2 children  . Heart disease Other     CAD    BP 130/78  Pulse 96  Temp 97.1 F (36.2 C) (Oral)  Wt 203 lb (92.08 kg)  SpO2 95%   Review of Systems denies hypoglycemia    Objective:   Physical Exam VITAL SIGNS:  See vs page GENERAL: no distress SKIN:  Insulin injection sites at the triceps areas are normal  Lab Results  Component Value Date   HGBA1C 8.4* 03/10/2012      Assessment & Plan:  DM.  needs increased rx

## 2012-03-10 NOTE — Patient Instructions (Addendum)
check your blood sugar 4 times a day--before the 3 meals, and at bedtime.  also check if you have symptoms of your blood sugar being too high or too low.  please keep a record of the readings and bring it to your next appointment here.  please call us sooner if you are having low blood sugar episodes.   increase lantus back to 60 units at bedtime blood tests are being requested for you today.  You will receive a letter with results. Please come back for a follow-up appointment in 3 months

## 2012-03-13 ENCOUNTER — Telehealth: Payer: Self-pay | Admitting: *Deleted

## 2012-03-13 NOTE — Telephone Encounter (Signed)
Called pt to inform of lab results, pt informed (letter also mailed to pt). 

## 2012-03-24 ENCOUNTER — Ambulatory Visit: Payer: Medicare HMO | Admitting: Endocrinology

## 2012-04-04 ENCOUNTER — Ambulatory Visit: Payer: Self-pay | Admitting: Internal Medicine

## 2012-04-18 ENCOUNTER — Other Ambulatory Visit: Payer: Self-pay | Admitting: Cardiology

## 2012-04-18 ENCOUNTER — Encounter: Payer: Self-pay | Admitting: Cardiology

## 2012-04-18 DIAGNOSIS — G629 Polyneuropathy, unspecified: Secondary | ICD-10-CM | POA: Insufficient documentation

## 2012-04-18 DIAGNOSIS — E669 Obesity, unspecified: Secondary | ICD-10-CM | POA: Insufficient documentation

## 2012-04-18 DIAGNOSIS — G4730 Sleep apnea, unspecified: Secondary | ICD-10-CM | POA: Insufficient documentation

## 2012-04-18 DIAGNOSIS — E785 Hyperlipidemia, unspecified: Secondary | ICD-10-CM | POA: Insufficient documentation

## 2012-04-18 NOTE — Progress Notes (Signed)
Laurie [name]  Date of visit:  04/18/2012 DOB:  1946/04/10    Age:  65 yrs. Medical record number:  [id number]     Account number:  [id number] Primary Care Provider: Eleonore Chiquito F ____________________________ CURRENT DIAGNOSES  1. CAD,Native  2. Surgery-Aortocoronary Bypass Grafting  3. Hyperlipidemia  4. Hypertensive Heart Disease-Benign without CHF  5. Diabetes Mellitus-IDD/circ dis/uncontroll  6. Obesity(BMI30-40)  7. Sleep apnea ____________________________ ALLERGIES  Lipitor, Intolerance-myalgia ____________________________ MEDICATIONS  1. Calcium Antacid Ultra Max St 400 mg (1,000 mg) tablet, chewable, 1 p.o. q.d.  2. fexofenadine 180 mg tablet, PRN  3. potassium chloride 20 mEq tablet,ER particles/crystals, 1 p.o. q.d.  4. Omega-3 350-235-90-640 mg capsule,delayed release(DR/EC), BID  5. Humalog KwikPen 100 unit/mL Insulin Pen, 110 uqam  140u lunch  230 u dinner  6. ketoconazole 2 % Cream, BID  7. Probiotic 10 billion cell Capsule, PRN  8. aspirin 81 mg Tablet, Chewable, 1 p.o. q.d.  9. lisinopril 40 mg Tablet, 1 p.o. daily  10. metoprolol succinate 50 mg Tablet Extended Release 24 hr, 1 p.o. daily  11. chlorthalidone 25 mg Tablet, 1 p.o. q.d.  12. multivitamin Tablet, 1 p.o. q.d.  13. Astelin 137 mcg Aerosol, Spray, PRN  14. Lantus 100 unit/mL Solution, 50u hs  15. Humalog KwikPen 100 unit/mL Insulin Pen, 130 u qam  190u lunch  290u dinner  16. Vicodin 5-300 mg tablet, PRN ____________________________ CHIEF COMPLAINTS  Followup of CAD,Native ____________________________ HISTORY OF PRESENT ILLNESS  Patient seen for cardiac followup. She has had a difficult year since she was here with a lot of issues with back pain and arthritis. She had a thyroid biopsy that was unremarkable. She had a negative Cardiolite last year but has gained significant weight and has significant insulin resistance and is on an enormous amount of insulin. She complains of difficulty walking  and weakness in her legs and evidently was seen by an urgent care recently and was started on prednisone. She complains of occasional rapid heartbeat. Her blood pressure is under fair control at home. Her lipids are under suboptimal control but she has a history of some statin intolerance. She denies angina and has no PND, orthopnea or claudication. She is having a mild amount of edema.  ____________________________ PAST HISTORY  Past Medical Illnesses:  hypertension, DM-non-insulin dependent, hyperlipidemia, obesity, sleep apnea, carpal tunnel syndrome, plantar fasicitis, Bell'spalsy, peripheral neuropathy, thyroid nodules, lumbar disc disease;  Cardiovascular Illnesses:  CAD;  Surgical Procedures:  CABG w LIMA to LAD, SVG to dx, SVG to OM, SVG to PDA 07/22/99 Dr. Laneta Simmers, tubal ligation, cyst removed right finger, surgery for heel spur, carpal tunnel release;  Cardiology Procedures-Invasive:  cardiac cath (left) 2000 1995;  Cardiology Procedures-Noninvasive:  echocardiogram June 2011, lexiscan cardiolite August 2012;  Cardiac Cath Results:  mild ostial, 80% stenosis proximal LAD, 90% stenosis proximal Diag 1, 95% stenosis proximal OM 1, 80% stenosis proximal RCA, 95% stenosis mid RCA;  LVEF of 67% documented via nuclear study on 04/22/2011 ____________________________ CARDIO-PULMONARY TEST DATES EKG Date:  03/02/2011;   Cardiac Cath Date:  07/21/1999;  CABG: 07/22/1999;  Nuclear Study Date:  04/22/2011;  Echocardiography Date: 03/04/2010;  Chest Xray Date: 08/10/1999;   ____________________________ SOCIAL HISTORY Alcohol Use:  no alcohol use;  Smoking:  never smoked;  Diet:  regular diet;  Lifestyle:  married;  Exercise:  exercises regularly and treadmill;  Occupation:  homemaker;  Residence:  lives with husband and son still at home and daughter at home;  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 ____________________________ REVIEW OF SYSTEMS General:  obesity, weight gain of approximately 15 lbs  Eyes:   wears eye glasses/contact lenses, no diabetic retinopathy Ears, Nose, Throat, Mouth:  seasonal sinusitis  Respiratory:  dyspnea with exertion  Cardiovascular:  please review HPI  Abdominal:  dyspepsia Musculoskeletal:  arthritis of the left foot, nocturnal cramps, chronic low back pain, arthritis of the hips knees  Neurological:  unsteady gait  Endocrine:  neuropathy ____________________________ PHYSICAL EXAMINATION VITAL SIGNS  Blood Pressure:  154/80 Sitting, Left arm, regular cuff  , 148/86 Sitting, Left arm and regular cuff   Pulse:  92/min. Weight:  202.00 lbs. Height:  64"BMI: 34  Constitutional:  pleasant white female, in no acute distress Skin:  warm and dry to touch, no apparent skin lesions, or masses noted. Head:  normocephalic, normal hair pattern, no masses or tenderness ENT:  ears, nose and throat reveal no gross abnormalities.  Dentition good. Neck:  supple, no masses, thyromegaly, JVD. Carotid pulses are full and equal bilaterally without bruits. Chest:  clear to auscultation and percussion, healed median sternotomy scar Cardiac:  regular rhythm, normal S1 and S2, no S3 or S4, grade 1/6 systolic murmur heard best at the base Peripheral Pulses:  the femoral,dorsalis pedis, and posterior tibial pulses are full and equal bilaterally with no bruits auscultated. Extremities & Back:  well healed saphenous vein donor site RLE, 1+ edema Neurological:  facial drop on left ____________________________ MOST RECENT LIPID PANEL 04/18/12  CHOL TOTL 223 mg/dl, LDL 981 calc, HDL 40 mg/dl, TRIGLYCER 191 mg/dl and CHOL/HDL 5.6 (Calc) ____________________________ IMPRESSIONS/PLAN  1. Coronary artery disease with previous bypass grafting with no angina 2. Hypertensive heart disease not well controlled 3. Hyperlipidemia above goal 4. Obesity with weight gain 5. Insulin-dependent diabetes mellitus poorly controlled with complications 6. Significant ssues with low back  pain  Recommendations:  She has a host of noncardiac problems. She is on an enormous amount of insulin but is still not well controlled. I would wonder whether she needs to see somebody about her back because of the amount of issues that she is having. She says her blood pressure is under fair control at home. Return visit in one year.  ____________________________ TODAYS ORDERS  1. Return Visit: 1 year                       ____________________________ Cardiology Physician:  [name] MD [facility]

## 2012-06-05 ENCOUNTER — Encounter: Payer: Self-pay | Admitting: Internal Medicine

## 2012-06-05 ENCOUNTER — Ambulatory Visit (INDEPENDENT_AMBULATORY_CARE_PROVIDER_SITE_OTHER): Payer: Medicare HMO | Admitting: Internal Medicine

## 2012-06-05 VITALS — BP 130/70 | Temp 98.0°F | Wt 207.0 lb

## 2012-06-05 DIAGNOSIS — I2510 Atherosclerotic heart disease of native coronary artery without angina pectoris: Secondary | ICD-10-CM

## 2012-06-05 DIAGNOSIS — E785 Hyperlipidemia, unspecified: Secondary | ICD-10-CM

## 2012-06-05 DIAGNOSIS — E1165 Type 2 diabetes mellitus with hyperglycemia: Secondary | ICD-10-CM

## 2012-06-05 DIAGNOSIS — J309 Allergic rhinitis, unspecified: Secondary | ICD-10-CM

## 2012-06-05 DIAGNOSIS — I119 Hypertensive heart disease without heart failure: Secondary | ICD-10-CM

## 2012-06-05 LAB — CBC WITH DIFFERENTIAL/PLATELET
Basophils Relative: 0.3 % (ref 0.0–3.0)
Eosinophils Relative: 1.1 % (ref 0.0–5.0)
HCT: 43.7 % (ref 36.0–46.0)
Lymphs Abs: 1.9 10*3/uL (ref 0.7–4.0)
MCHC: 33.3 g/dL (ref 30.0–36.0)
MCV: 94.5 fl (ref 78.0–100.0)
Monocytes Absolute: 0.7 10*3/uL (ref 0.1–1.0)
Platelets: 218 10*3/uL (ref 150.0–400.0)
WBC: 7.7 10*3/uL (ref 4.5–10.5)

## 2012-06-05 LAB — HEMOGLOBIN A1C: Hgb A1c MFr Bld: 8.3 % — ABNORMAL HIGH (ref 4.6–6.5)

## 2012-06-05 MED ORDER — METHYLPREDNISOLONE ACETATE 40 MG/ML IJ SUSP
40.0000 mg | Freq: Once | INTRAMUSCULAR | Status: AC
  Administered 2012-06-05: 40 mg via INTRAMUSCULAR

## 2012-06-05 NOTE — Patient Instructions (Signed)
Limit your sodium (Salt) intake   Please check your hemoglobin A1c every 3 months    It is important that you exercise regularly, at least 20 minutes 3 to 4 times per week.  If you develop chest pain or shortness of breath seek  medical attention.   

## 2012-06-05 NOTE — Progress Notes (Signed)
Subjective:    Patient ID: Laurie [name], female    DOB: 11-27-1945, 66 y.o.   MRN: [id number]  HPI  66 year old patient who is seen today for followup; she has a history of diabetes which is controlled with basal/bolus insulin and is followed by endocrinology. She has a followup in 3 days and is scheduled to see ophthalmology next month. Her last hemoglobin A1c 8.4 She has treated hypertension and also history of dyslipidemia. She has coronary artery disease and denies any exertional chest pain. No cardiopulmonary complaints  Past Medical History  Diagnosis Date  . ALLERGIC RHINITIS 01/30/2007  . CORONARY ARTERY DISEASE 01/30/2007  . DIABETES MELLITUS, TYPE II 01/30/2007  . HYPERTENSION 01/30/2007  . HYPERLIPIDEMIA 01/30/2007  . Bell's palsy 01/05/2010  . GOITER, MULTINODULAR 05/14/2010  . HEPATITIS 12/06/2007  . Thyromegaly     mild  . 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 Hyperlipidemia 04/18/2012    Intolerance to several statins   . Hypertensive heart disease without CHF   . HIstory of Bell's palsy     October 2012   . Obesity (BMI 30-39.9)   . Peripheral neuropathy     History   Social History  . Marital Status: Married    Spouse Name: N/A    Number of Children: 2  . Years of Education: N/A   Occupational History  .     Social History Main Topics  . Smoking status: Never Smoker   . Smokeless tobacco: Never Used  . Alcohol Use: No  . Drug Use: No  . Sexually Active: Not on file   Other Topics Concern  . Not on file   Social History Narrative   Does not work outside the home    Past Surgical History  Procedure Date  . Coronary artery bypass graft 2000  . Tubal ligation   . Biopsy thyroid 05/2010    percutaneous    Family History  Problem Relation Age of Onset  . Diabetes Father   . Diabetes Other     Siblings and 2 children  . Heart disease Other     CAD    No Known Allergies  Current  Outpatient Prescriptions on File Prior to Visit  Medication Sig Dispense Refill  . ALLEGRA ALLERGY 180 MG tablet TAKE 1 TABLET DAILY  50 tablet  3  . AMBULATORY NON FORMULARY MEDICATION Diabetic Nutrition Pack with [place], Chromium and Vitamin D3 1500 IU      . AMBULATORY NON FORMULARY MEDICATION Omega Q Plus  Once daily      . AMBULATORY NON FORMULARY MEDICATION Per Q Sorb      . azelastine (ASTELIN) 137 MCG/SPRAY nasal spray Place 2 sprays into the nose 2 (two) times daily. Use in each nostril as directed  30 mL  3  . beta carotene w/minerals (OCUVITE) tablet Take 1 tablet by mouth daily.      . chlorthalidone (HYGROTON) 25 MG tablet Take 1 tablet (25 mg total) by mouth daily.  90 tablet  3  . fenofibrate (TRICOR) 145 MG tablet Take 1 tablet (145 mg total) by mouth daily.  90 tablet  3  . Glucosamine-Chondroitin (MOVE FREE PO) Take 1 capsule by mouth daily.      [name] glucose blood (ACCU-CHEK COMPACT TEST DRUM) test strip Use as instructed to check blood sugar 4 times daily  400 each  3  . insulin glargine (LANTUS) 100 UNIT/ML injection Inject 60 Units  into the skin at bedtime.      . insulin lispro (HUMALOG) 100 UNIT/ML injection 130 units with breakfast, 190 units with lunch, and 290 units with evening meal- add  30 additional units if blood sugar is greater than 300      . Insulin Pen Needle (B-D ULTRAFINE III SHORT PEN) 31G X 8 MM MISC Use as directed four times daily  120 each  1  . ketoconazole (NIZORAL) 2 % cream Apply topically as needed.  15 g  3  . lisinopril (PRINIVIL,ZESTRIL) 40 MG tablet Take 1 tablet (40 mg total) by mouth daily.  90 tablet  3  . metoprolol succinate (TOPROL-XL) 50 MG 24 hr tablet Take 1 tablet (50 mg total) by mouth daily. Take with or immediately following a meal.  90 tablet  3  . Multiple Vitamin (MULTIVITAMIN) tablet Take 1 tablet by mouth daily.        [name] omeprazole (PRILOSEC) 20 MG capsule Take 1 capsule (20 mg total) by mouth daily.  90 capsule  3  .  potassium chloride SA (K-DUR,KLOR-CON) 20 MEQ tablet Take 1 tablet (20 mEq total) by mouth daily.  90 tablet  3   No current facility-administered medications on file prior to visit.    BP 130/70  Temp 98 F (36.7 C) (Oral)  Wt 207 lb (93.895 kg)      Review of Systems  Constitutional: Negative.   HENT: Negative for hearing loss, congestion, sore throat, rhinorrhea, dental problem, sinus pressure and tinnitus.   Eyes: Negative for pain, discharge and visual disturbance.  Respiratory: Negative for cough and shortness of breath.   Cardiovascular: Negative for chest pain, palpitations and leg swelling.  Gastrointestinal: Negative for nausea, vomiting, abdominal pain, diarrhea, constipation, blood in stool and abdominal distention.  Genitourinary: Negative for dysuria, urgency, frequency, hematuria, flank pain, vaginal bleeding, vaginal discharge, difficulty urinating, vaginal pain and pelvic pain.  Musculoskeletal: Negative for joint swelling, arthralgias and gait problem.  Skin: Negative for rash.  Neurological: Negative for dizziness, syncope, speech difficulty, weakness, numbness and headaches.  Hematological: Negative for adenopathy.  Psychiatric/Behavioral: Negative for behavioral problems, dysphoric mood and agitation. The patient is not nervous/anxious.        Objective:   Physical Exam  Constitutional: She is oriented to person, place, and time. She appears well-developed and well-nourished.  HENT:  Head: Normocephalic.  Right Ear: External ear normal.  Left Ear: External ear normal.  Mouth/Throat: Oropharynx is clear and moist.  Eyes: Conjunctivae normal and EOM are normal. Pupils are equal, round, and reactive to light.  Neck: Normal range of motion. Neck supple. No thyromegaly present.  Cardiovascular: Normal rate, regular rhythm, normal heart sounds and intact distal pulses.   Pulmonary/Chest: Effort normal and breath sounds normal.  Abdominal: Soft. Bowel sounds  are normal. She exhibits no mass. There is no tenderness.  Musculoskeletal: Normal range of motion.  Lymphadenopathy:    She has no cervical adenopathy.  Neurological: She is alert and oriented to person, place, and time.  Skin: Skin is warm and dry. No rash noted.  Psychiatric: She has a normal mood and affect. Her behavior is normal.          Assessment & Plan:   Diabetes mellitus. Will check a hemoglobin A1c as well as a urine for microalbuminuria. Followup endocrinology later this week Hypertension stable Coronary artery disease. Asymptomatic. Follow cardiology  Recheck here 6 months

## 2012-06-06 LAB — TSH: TSH: 0.94 u[IU]/mL (ref 0.35–5.50)

## 2012-06-06 LAB — COMPREHENSIVE METABOLIC PANEL
AST: 40 U/L — ABNORMAL HIGH (ref 0–37)
Alkaline Phosphatase: 58 U/L (ref 39–117)
Glucose, Bld: 208 mg/dL — ABNORMAL HIGH (ref 70–99)
Sodium: 142 mEq/L (ref 135–145)
Total Bilirubin: 0.4 mg/dL (ref 0.3–1.2)
Total Protein: 7.6 g/dL (ref 6.0–8.3)

## 2012-06-06 LAB — LIPID PANEL
Cholesterol: 230 mg/dL — ABNORMAL HIGH (ref 0–200)
HDL: 39.9 mg/dL
Total CHOL/HDL Ratio: 6
Triglycerides: 336 mg/dL — ABNORMAL HIGH (ref 0.0–149.0)
VLDL: 67.2 mg/dL — ABNORMAL HIGH (ref 0.0–40.0)

## 2012-06-06 LAB — LDL CHOLESTEROL, DIRECT: Direct LDL: 155.1 mg/dL

## 2012-06-09 ENCOUNTER — Ambulatory Visit (INDEPENDENT_AMBULATORY_CARE_PROVIDER_SITE_OTHER): Payer: Medicare HMO | Admitting: Endocrinology

## 2012-06-09 VITALS — BP 148/76 | HR 106 | Temp 98.6°F | Ht 63.5 in | Wt 205.0 lb

## 2012-06-09 DIAGNOSIS — E118 Type 2 diabetes mellitus with unspecified complications: Secondary | ICD-10-CM

## 2012-06-09 DIAGNOSIS — Z23 Encounter for immunization: Secondary | ICD-10-CM

## 2012-06-09 DIAGNOSIS — E1165 Type 2 diabetes mellitus with hyperglycemia: Secondary | ICD-10-CM

## 2012-06-09 NOTE — Patient Instructions (Addendum)
check your blood sugar 4 times a day--before the 3 meals, and at bedtime.  also check if you have symptoms of your blood sugar being too high or too low.  please keep a record of the readings and bring it to your next appointment here.  please call us sooner if you are having low blood sugar episodes.   Please come back for a follow-up appointment in 3-4 months. Please increase humalog to 140 units with breakfast, 190 units with lunch, and 320 units with evening meal.

## 2012-06-09 NOTE — Addendum Note (Signed)
Addended by: Scharlene Gloss B on: 06/09/2012 01:35 PM   Modules accepted: Orders

## 2012-06-09 NOTE — Progress Notes (Signed)
Subjective:    Patient ID: Laurie [name], female    DOB: 14-Jul-1946, 66 y.o.   MRN: [id number]  HPI Pt returns for f/u of insulin-requiring DM (dx'ed 1995; complicated by CAD and peripheral sensory neuropathy; characterized by severe insulin resistance).  pt states she feels well in general, except for weight gain.  she brings a record of her cbg's which i have reviewed today.   Past Medical History  Diagnosis Date  . ALLERGIC RHINITIS 01/30/2007  . CORONARY ARTERY DISEASE 01/30/2007  . DIABETES MELLITUS, TYPE II 01/30/2007  . HYPERTENSION 01/30/2007  . HYPERLIPIDEMIA 01/30/2007  . Bell's palsy 01/05/2010  . GOITER, MULTINODULAR 05/14/2010  . HEPATITIS 12/06/2007  . Thyromegaly     mild  . 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 Hyperlipidemia 04/18/2012    Intolerance to several statins   . Hypertensive heart disease without CHF   . HIstory of Bell's palsy     October 2012   . Obesity (BMI 30-39.9)   . Peripheral neuropathy     Past Surgical History  Procedure Date  . Coronary artery bypass graft 2000  . Tubal ligation   . Biopsy thyroid 05/2010    percutaneous    History   Social History  . Marital Status: Married    Spouse Name: N/A    Number of Children: 2  . Years of Education: N/A   Occupational History  .     Social History Main Topics  . Smoking status: Never Smoker   . Smokeless tobacco: Never Used  . Alcohol Use: No  . Drug Use: No  . Sexually Active: Not on file   Other Topics Concern  . Not on file   Social History Narrative   Does not work outside the home    Current Outpatient Prescriptions on File Prior to Visit  Medication Sig Dispense Refill  . ALLEGRA ALLERGY 180 MG tablet TAKE 1 TABLET DAILY  50 tablet  3  . AMBULATORY NON FORMULARY MEDICATION Diabetic Nutrition Pack with [place], Chromium and Vitamin D3 1500 IU      . AMBULATORY NON FORMULARY MEDICATION Omega Q Plus  Once daily        . AMBULATORY NON FORMULARY MEDICATION Per Q Sorb      . azelastine (ASTELIN) 137 MCG/SPRAY nasal spray Place 2 sprays into the nose 2 (two) times daily. Use in each nostril as directed  30 mL  3  . beta carotene w/minerals (OCUVITE) tablet Take 1 tablet by mouth daily.      . chlorthalidone (HYGROTON) 25 MG tablet Take 1 tablet (25 mg total) by mouth daily.  90 tablet  3  . fenofibrate (TRICOR) 145 MG tablet Take 1 tablet (145 mg total) by mouth daily.  90 tablet  3  . Glucosamine-Chondroitin (MOVE FREE PO) Take 1 capsule by mouth daily.      [name] glucose blood (ACCU-CHEK COMPACT TEST DRUM) test strip Use as instructed to check blood sugar 4 times daily  400 each  3  . insulin glargine (LANTUS) 100 UNIT/ML injection Inject 60 Units into the skin at bedtime.      . insulin lispro (HUMALOG) 100 UNIT/ML injection 130 units with breakfast, 190 units with lunch, and 290 units with evening meal- add  30 additional units if blood sugar is greater than 300      . Insulin Pen Needle (B-D ULTRAFINE III SHORT PEN) 31G X 8 MM MISC Use  as directed four times daily  120 each  1  . ketoconazole (NIZORAL) 2 % cream Apply topically as needed.  15 g  3  . lisinopril (PRINIVIL,ZESTRIL) 40 MG tablet Take 1 tablet (40 mg total) by mouth daily.  90 tablet  3  . metoprolol succinate (TOPROL-XL) 50 MG 24 hr tablet Take 1 tablet (50 mg total) by mouth daily. Take with or immediately following a meal.  90 tablet  3  . Multiple Vitamin (MULTIVITAMIN) tablet Take 1 tablet by mouth daily.        [name] omeprazole (PRILOSEC) 20 MG capsule Take 1 capsule (20 mg total) by mouth daily.  90 capsule  3  . potassium chloride SA (K-DUR,KLOR-CON) 20 MEQ tablet Take 1 tablet (20 mEq total) by mouth daily.  90 tablet  3    No Known Allergies  Family History  Problem Relation Age of Onset  . Diabetes Father   . Diabetes Other     Siblings and 2 children  . Heart disease Other     CAD    BP 148/76  Pulse 106  Temp 98.6 F (37 C)  (Oral)  Ht 5' 3.5" (1.613 m)  Wt 205 lb (92.987 kg)  BMI 35.74 kg/m2  SpO2 94%  Review of Systems denies hypoglycemia    Objective:   Physical Exam Pulses: dorsalis pedis intact bilat.  Feet: no deformity. no ulcer on the feet. feet are of normal color and temp. Trace bilat leg edema. There is a healed vein harvest scar at the left leg.  Neuro: sensation is intact to touch on the feet.    Lab Results  Component Value Date   HGBA1C 8.3* 06/05/2012      Assessment & Plan:  DM: needs increased rx

## 2012-06-12 ENCOUNTER — Other Ambulatory Visit: Payer: Self-pay | Admitting: General Practice

## 2012-06-12 MED ORDER — GLUCOSE BLOOD VI STRP: ORAL_STRIP | Status: DC

## 2012-09-20 ENCOUNTER — Other Ambulatory Visit: Payer: Self-pay

## 2012-10-05 ENCOUNTER — Telehealth: Payer: Self-pay | Admitting: Endocrinology

## 2012-10-05 NOTE — Telephone Encounter (Signed)
Prior auth form on your desk to be compeleted

## 2012-10-05 NOTE — Telephone Encounter (Signed)
The patient left message on office voicemail to request that her prescriptions be sent to [facility].  The patient may be reached at [phone] if needed.

## 2012-10-06 ENCOUNTER — Other Ambulatory Visit: Payer: Self-pay

## 2012-10-13 ENCOUNTER — Ambulatory Visit: Payer: Medicare HMO | Admitting: Endocrinology

## 2012-10-13 DIAGNOSIS — Z0289 Encounter for other administrative examinations: Secondary | ICD-10-CM

## 2012-10-21 ENCOUNTER — Emergency Department (HOSPITAL_COMMUNITY): Payer: Medicare HMO

## 2012-10-21 ENCOUNTER — Encounter (HOSPITAL_COMMUNITY): Payer: Self-pay | Admitting: *Deleted

## 2012-10-21 ENCOUNTER — Inpatient Hospital Stay (HOSPITAL_COMMUNITY)
Admission: EM | Admit: 2012-10-21 | Discharge: 2012-10-26 | DRG: 242 | Disposition: A | Discharge status: Home / Self Care | Payer: Medicare HMO | Attending: Internal Medicine | Admitting: Internal Medicine

## 2012-10-21 DIAGNOSIS — G4730 Sleep apnea, unspecified: Secondary | ICD-10-CM | POA: Diagnosis present

## 2012-10-21 DIAGNOSIS — IMO0001 Reserved for inherently not codable concepts without codable children: Secondary | ICD-10-CM | POA: Diagnosis present

## 2012-10-21 DIAGNOSIS — Z95810 Presence of automatic (implantable) cardiac defibrillator: Secondary | ICD-10-CM | POA: Diagnosis not present

## 2012-10-21 DIAGNOSIS — I442 Atrioventricular block, complete: Principal | ICD-10-CM | POA: Diagnosis present

## 2012-10-21 DIAGNOSIS — E1165 Type 2 diabetes mellitus with hyperglycemia: Secondary | ICD-10-CM

## 2012-10-21 DIAGNOSIS — I214 Non-ST elevation (NSTEMI) myocardial infarction: Secondary | ICD-10-CM | POA: Diagnosis present

## 2012-10-21 DIAGNOSIS — I452 Bifascicular block: Secondary | ICD-10-CM | POA: Diagnosis present

## 2012-10-21 DIAGNOSIS — I441 Atrioventricular block, second degree: Secondary | ICD-10-CM | POA: Diagnosis present

## 2012-10-21 DIAGNOSIS — Z950 Presence of cardiac pacemaker: Secondary | ICD-10-CM

## 2012-10-21 DIAGNOSIS — R55 Syncope and collapse: Secondary | ICD-10-CM

## 2012-10-21 DIAGNOSIS — R778 Other specified abnormalities of plasma proteins: Secondary | ICD-10-CM

## 2012-10-21 DIAGNOSIS — E785 Hyperlipidemia, unspecified: Secondary | ICD-10-CM | POA: Diagnosis present

## 2012-10-21 DIAGNOSIS — E1040 Type 1 diabetes mellitus with diabetic neuropathy, unspecified: Secondary | ICD-10-CM | POA: Diagnosis present

## 2012-10-21 DIAGNOSIS — Z951 Presence of aortocoronary bypass graft: Secondary | ICD-10-CM

## 2012-10-21 DIAGNOSIS — Y831 Surgical operation with implant of artificial internal device as the cause of abnormal reaction of the patient, or of later complication, without mention of misadventure at the time of the procedure: Secondary | ICD-10-CM | POA: Diagnosis not present

## 2012-10-21 DIAGNOSIS — T82190A Other mechanical complication of cardiac electrode, initial encounter: Secondary | ICD-10-CM | POA: Diagnosis not present

## 2012-10-21 DIAGNOSIS — I119 Hypertensive heart disease without heart failure: Secondary | ICD-10-CM | POA: Diagnosis present

## 2012-10-21 DIAGNOSIS — G609 Hereditary and idiopathic neuropathy, unspecified: Secondary | ICD-10-CM | POA: Diagnosis present

## 2012-10-21 DIAGNOSIS — I2510 Atherosclerotic heart disease of native coronary artery without angina pectoris: Secondary | ICD-10-CM | POA: Diagnosis present

## 2012-10-21 DIAGNOSIS — G4733 Obstructive sleep apnea (adult) (pediatric): Secondary | ICD-10-CM | POA: Diagnosis present

## 2012-10-21 DIAGNOSIS — I10 Essential (primary) hypertension: Secondary | ICD-10-CM

## 2012-10-21 DIAGNOSIS — E669 Obesity, unspecified: Secondary | ICD-10-CM | POA: Diagnosis present

## 2012-10-21 HISTORY — DX: Atherosclerotic heart disease of native coronary artery without angina pectoris: I25.10

## 2012-10-21 LAB — POCT I-STAT TROPONIN I: Troponin i, poc: 1.6 ng/mL (ref 0.00–0.08)

## 2012-10-21 LAB — BASIC METABOLIC PANEL
CO2: 24 mEq/L (ref 19–32)
Chloride: 98 mEq/L (ref 96–112)
Creatinine, Ser: 0.61 mg/dL (ref 0.50–1.10)
GFR calc Af Amer: >90 mL/min (ref >90)
Potassium: 3.7 mEq/L (ref 3.5–5.1)

## 2012-10-21 LAB — CK TOTAL AND CKMB (NOT AT ARMC)
CK, MB: 5.8 ng/mL — ABNORMAL HIGH (ref 0.3–4.0)
Relative Index: 4.3 — ABNORMAL HIGH (ref 0.0–2.5)
Total CK: 134 U/L (ref 7–177)

## 2012-10-21 LAB — CBC
HCT: 41.8 % (ref 36.0–46.0)
MCV: 90.9 fL (ref 78.0–100.0)
Platelets: 222 10*3/uL (ref 150–400)
RBC: 4.6 MIL/uL (ref 3.87–5.11)
RDW: 12.9 % (ref 11.5–15.5)
WBC: 9.1 10*3/uL (ref 4.0–10.5)

## 2012-10-21 LAB — GLUCOSE, CAPILLARY

## 2012-10-21 LAB — TROPONIN I: Troponin I: 1.52 ng/mL (ref <0.30)

## 2012-10-21 MED ORDER — POTASSIUM CHLORIDE CRYS ER 20 MEQ PO TBCR
20.0000 meq | EXTENDED_RELEASE_TABLET | Freq: Two times a day (BID) | ORAL | Status: DC
  Administered 2012-10-21 – 2012-10-26 (×10): 20 meq via ORAL
  Filled 2012-10-21 (×12): qty 1

## 2012-10-21 MED ORDER — SALINE SPRAY 0.65 % NA SOLN
1.0000 | NASAL | Status: DC | PRN
  Filled 2012-10-21: qty 44

## 2012-10-21 MED ORDER — CHLORTHALIDONE 25 MG PO TABS
25.0000 mg | ORAL_TABLET | Freq: Every day | ORAL | Status: DC
  Administered 2012-10-22 – 2012-10-26 (×5): 25 mg via ORAL
  Filled 2012-10-21 (×5): qty 1

## 2012-10-21 MED ORDER — HYDROCODONE-ACETAMINOPHEN 5-325 MG PO TABS
1.0000 | ORAL_TABLET | ORAL | Status: DC | PRN
  Administered 2012-10-22 – 2012-10-23 (×3): 2 via ORAL
  Administered 2012-10-24 – 2012-10-25 (×2): 1 via ORAL
  Filled 2012-10-21 (×3): qty 2
  Filled 2012-10-21 (×2): qty 1

## 2012-10-21 MED ORDER — ASPIRIN EC 325 MG PO TBEC
325.0000 mg | DELAYED_RELEASE_TABLET | Freq: Every day | ORAL | Status: DC
  Administered 2012-10-22: 325 mg via ORAL
  Filled 2012-10-21 (×2): qty 1

## 2012-10-21 MED ORDER — PANTOPRAZOLE SODIUM 40 MG PO TBEC
40.0000 mg | DELAYED_RELEASE_TABLET | Freq: Every day | ORAL | Status: DC
  Administered 2012-10-22 – 2012-10-26 (×5): 40 mg via ORAL
  Filled 2012-10-21 (×5): qty 1

## 2012-10-21 MED ORDER — HYDROMORPHONE HCL PF 1 MG/ML IJ SOLN: 0.5000 mg | INTRAMUSCULAR | Status: DC | PRN

## 2012-10-21 MED ORDER — SODIUM CHLORIDE 0.9 % IJ SOLN
3.0000 mL | Freq: Two times a day (BID) | INTRAMUSCULAR | Status: DC
  Administered 2012-10-21: 3 mL via INTRAVENOUS

## 2012-10-21 MED ORDER — PROBIOTIC DAILY PO CAPS: 1.0000 | ORAL_CAPSULE | Freq: Every day | ORAL | Status: DC

## 2012-10-21 MED ORDER — INSULIN ASPART 100 UNIT/ML ~~LOC~~ SOLN: 0.0000 [IU] | Freq: Every day | SUBCUTANEOUS | Status: DC

## 2012-10-21 MED ORDER — INSULIN ASPART 100 UNIT/ML ~~LOC~~ SOLN
190.0000 [IU] | Freq: Every day | SUBCUTANEOUS | Status: DC
  Administered 2012-10-22 – 2012-10-24 (×2): 190 [IU] via SUBCUTANEOUS

## 2012-10-21 MED ORDER — SALINE NASAL SPRAY 0.65 % NA SOLN
1.0000 | NASAL | Status: DC | PRN
Start: 2012-10-21 — End: 2012-10-21

## 2012-10-21 MED ORDER — INSULIN ASPART 100 UNIT/ML ~~LOC~~ SOLN
0.0000 [IU] | Freq: Three times a day (TID) | SUBCUTANEOUS | Status: DC
  Administered 2012-10-22: 3 [IU] via SUBCUTANEOUS
  Administered 2012-10-22: 2 [IU] via SUBCUTANEOUS

## 2012-10-21 MED ORDER — LORATADINE 10 MG PO TABS
10.0000 mg | ORAL_TABLET | Freq: Every day | ORAL | Status: DC
  Administered 2012-10-22 – 2012-10-26 (×5): 10 mg via ORAL
  Filled 2012-10-21 (×5): qty 1

## 2012-10-21 MED ORDER — ASPIRIN 81 MG PO CHEW
CHEWABLE_TABLET | ORAL | Status: AC
  Administered 2012-10-21: 81 mg
  Filled 2012-10-21: qty 1

## 2012-10-21 MED ORDER — ASPIRIN 81 MG PO CHEW
324.0000 mg | CHEWABLE_TABLET | Freq: Once | ORAL | Status: AC
  Administered 2012-10-21: 243 mg via ORAL
  Filled 2012-10-21: qty 4

## 2012-10-21 MED ORDER — ACETAMINOPHEN 650 MG RE SUPP: 650.0000 mg | Freq: Four times a day (QID) | RECTAL | Status: DC | PRN

## 2012-10-21 MED ORDER — ALBUTEROL SULFATE (5 MG/ML) 0.5% IN NEBU: 2.5000 mg | INHALATION_SOLUTION | RESPIRATORY_TRACT | Status: DC | PRN

## 2012-10-21 MED ORDER — INSULIN GLARGINE 100 UNIT/ML ~~LOC~~ SOLN
60.0000 [IU] | Freq: Every day | SUBCUTANEOUS | Status: DC
  Administered 2012-10-21 – 2012-10-25 (×4): 60 [IU] via SUBCUTANEOUS

## 2012-10-21 MED ORDER — HEPARIN BOLUS VIA INFUSION
3500.0000 [IU] | Freq: Once | INTRAVENOUS | Status: AC
  Administered 2012-10-21: 3500 [IU] via INTRAVENOUS

## 2012-10-21 MED ORDER — ACETAMINOPHEN 325 MG PO TABS: 650.0000 mg | ORAL_TABLET | Freq: Four times a day (QID) | ORAL | Status: DC | PRN

## 2012-10-21 MED ORDER — INSULIN ASPART 100 UNIT/ML ~~LOC~~ SOLN
290.0000 [IU] | Freq: Every day | SUBCUTANEOUS | Status: DC
  Administered 2012-10-22 – 2012-10-25 (×3): 290 [IU] via SUBCUTANEOUS

## 2012-10-21 MED ORDER — SODIUM CHLORIDE 0.9 % IV SOLN
INTRAVENOUS | Status: DC
  Administered 2012-10-21: 14:00:00 via INTRAVENOUS

## 2012-10-21 MED ORDER — RISAQUAD PO CAPS
1.0000 | ORAL_CAPSULE | Freq: Every day | ORAL | Status: DC
  Administered 2012-10-22 – 2012-10-25 (×4): 1 via ORAL
  Filled 2012-10-21 (×7): qty 1

## 2012-10-21 MED ORDER — ONDANSETRON HCL 4 MG/2ML IJ SOLN: 4.0000 mg | Freq: Four times a day (QID) | INTRAMUSCULAR | Status: DC | PRN

## 2012-10-21 MED ORDER — ONDANSETRON HCL 4 MG PO TABS: 4.0000 mg | ORAL_TABLET | Freq: Four times a day (QID) | ORAL | Status: DC | PRN

## 2012-10-21 MED ORDER — LISINOPRIL 40 MG PO TABS
40.0000 mg | ORAL_TABLET | Freq: Every day | ORAL | Status: DC
  Administered 2012-10-22 – 2012-10-26 (×5): 40 mg via ORAL
  Filled 2012-10-21 (×5): qty 1

## 2012-10-21 MED ORDER — INSULIN ASPART 100 UNIT/ML ~~LOC~~ SOLN
140.0000 [IU] | Freq: Every day | SUBCUTANEOUS | Status: DC
  Administered 2012-10-22 – 2012-10-26 (×3): 140 [IU] via SUBCUTANEOUS

## 2012-10-21 MED ORDER — HEPARIN (PORCINE) IN NACL 100-0.45 UNIT/ML-% IJ SOLN
900.0000 [IU]/h | INTRAMUSCULAR | Status: DC
  Administered 2012-10-21: 900 [IU]/h via INTRAVENOUS
  Filled 2012-10-21: qty 250

## 2012-10-21 MED ORDER — METOPROLOL SUCCINATE ER 50 MG PO TB24: 50.0000 mg | ORAL_TABLET | Freq: Every day | ORAL | Status: DC

## 2012-10-21 NOTE — ED Notes (Signed)
Per Dr Waymon Amato, change admission from Observation to Inpatient status, Bed Control notified.

## 2012-10-21 NOTE — Consult Note (Signed)
Admit date: 10/21/2012 Referring Physician  Dr. Freida Busman Primary Physician Rogelia Boga, MD Primary Cardiologist  Dr. Donnie Aho Reason for Consultation  Syncope, +Troponin  HPI: 67 year old female with coronary artery disease status post bypass surgery, diabetes, obesity, hypertension, hyperlipidemia who earlier today had 2 separate syncopal episodes. The first episode occurred while she was in her recliner her family member stated that she began to snore and was perhaps unconscious for a short while. She then had another episode after she got up and went to the bathroom. While sitting on the toilet, she quickly felt as though she was going to pass out and before she knew it she was on the ground. She had hit her head. She did not have a long enough warning to yell for help.  In the emergency department, point-of-care troponin was drawn and was 1.6. At no point did she describe any chest pain, shortness of breath, orthopnea, palpitations. Her glucose was 239, hemoglobin A1c's have been elevated. Potassium 3.7, creatinine 0.6. Hemoglobin 14. CT of head showed no acute findings. Chest x-ray normal. EKG demonstrates right bundle branch block, left anterior fascicular block, sinus rhythm, normal intervals except for QRS duration. This is no significant change from prior EKG in 2003.  She admits to being noncompliant with her CPAP mask. She has several family members with her. They're quite nice. She does take Toprol. No other AV nodal blocking agents.    PMH:   Past Medical History  Diagnosis Date  . ALLERGIC RHINITIS 01/30/2007  . CORONARY ARTERY DISEASE 01/30/2007  . DIABETES MELLITUS, TYPE II 01/30/2007  . HYPERTENSION 01/30/2007  . HYPERLIPIDEMIA 01/30/2007  . Bell's palsy 01/05/2010  . GOITER, MULTINODULAR 05/14/2010  . HEPATITIS 12/06/2007  . Thyromegaly     mild  . 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 Hyperlipidemia 04/18/2012   Intolerance to several statins   . Hypertensive heart disease without CHF   . HIstory of Bell's palsy     October 2012   . Obesity (BMI 30-39.9)   . Peripheral neuropathy   . Sleep apnea     PSH:   Past Surgical History  Procedure Laterality Date  . Coronary artery bypass graft  2000  . Tubal ligation    . Biopsy thyroid  05/2010    percutaneous  . Foot surgery    . Carpal tunnel release    . Dental surgery     Allergies:  Tricor Prior to Admit Meds:   (Not in a hospital admission) Prior to Admission medications   Medication Sig Start Date End Date Taking? Authorizing Provider  AMBULATORY NON FORMULARY MEDICATION Diabetic Nutrition Pack with [place], Chromium and Vitamin D3 1500 IU   Yes Historical Provider, MD  AMBULATORY NON FORMULARY MEDICATION Omega Q Plus  Once daily   Yes Historical Provider, MD  AMBULATORY NON FORMULARY MEDICATION Per Q Sorb   Yes Historical Provider, MD  b complex vitamins tablet Take 1 tablet by mouth daily.   Yes Historical Provider, MD  beta carotene w/minerals (OCUVITE) tablet Take 1 tablet by mouth daily.   Yes Historical Provider, MD  chlorthalidone (HYGROTON) 25 MG tablet Take 25 mg by mouth daily.   Yes Historical Provider, MD  Cholecalciferol (VITAMIN D3) 50000 UNITS CAPS Take 1 tablet by mouth daily.   Yes Historical Provider, MD  Glucosamine-Chondroitin (MOVE FREE PO) Take 1 capsule by mouth daily.   Yes Historical Provider, MD  insulin glargine (LANTUS) 100 UNIT/ML injection  Inject 60 Units into the skin at bedtime. 12/20/11 12/19/12 Yes Romero Belling, MD  insulin lispro (HUMALOG) 100 UNIT/ML injection 140 units with breakfast, 190 units with lunch, and 290 units with evening meal- add  30 additional units if blood sugar is greater than 300 03/03/12  Yes Gordy Savers, MD  ketoconazole (NIZORAL) 2 % cream Apply 1 application topically daily as needed (for face rash).   Yes Historical Provider, MD  lisinopril (PRINIVIL,ZESTRIL) 40 MG tablet  Take 40 mg by mouth daily.   Yes Historical Provider, MD  loratadine (CLARITIN) 10 MG tablet Take 10 mg by mouth daily.   Yes Historical Provider, MD  metoprolol succinate (TOPROL-XL) 50 MG 24 hr tablet Take 50 mg by mouth daily. Take with or immediately following a meal.   Yes Historical Provider, MD  Multiple Vitamin (MULTIVITAMIN) tablet Take 1 tablet by mouth daily.     Yes Historical Provider, MD  omeprazole (PRILOSEC) 20 MG capsule Take 20 mg by mouth daily as needed (acid flux). 10/22/11 10/21/12 Yes Gordy Savers, MD  potassium chloride SA (K-DUR,KLOR-CON) 20 MEQ tablet Take 20 mEq by mouth 2 (two) times daily.   Yes Historical Provider, MD  Probiotic Product (PROBIOTIC DAILY) CAPS Take 1 capsule by mouth daily.   Yes Historical Provider, MD  sodium chloride (OCEAN) 0.65 % nasal spray Place 1 spray into the nose as needed for congestion.   Yes Historical Provider, MD   Fam HX:    Family History  Problem Relation Age of Onset  . Diabetes Father   . Heart disease Father   . Diabetes Other     Siblings and 2 children  . Heart disease Other     CAD  . Diabetes Mother    Social HX:    History   Social History  . Marital Status: Married    Spouse Name: N/A    Number of Children: 2  . Years of Education: N/A   Occupational History  .     Social History Main Topics  . Smoking status: Never Smoker   . Smokeless tobacco: Never Used  . Alcohol Use: No  . Drug Use: No  . Sexually Active: Not on file   Other Topics Concern  . Not on file   Social History Narrative   Does not work outside the home     ROS:  Denies any recent fevers, nausea, diarrhea, emesis, chest pain, shortness of breath, orthopnea, PND. All 11 ROS were addressed and are negative except what is stated in the HPI  Physical Exam: Blood pressure 165/77, pulse 94, temperature 98.6 F (37 C), temperature source Oral, resp. rate 21, weight 91.173 kg (201 lb), SpO2 93.00%.    General: Well developed, well  nourished, in no acute distress Head: Eyes PERRLA, No xanthomas.  Mild right fore head excoriation.  Lungs:   Clear bilaterally to auscultation and percussion. Normal respiratory effort. No wheezes, no rales. Heart:   HRRR S1 S2 Pulses are 2+ & equal. No significant murmurs appreciated           No carotid bruit. No JVD.  No abdominal bruits. Prior chest wall scar noted Abdomen: Bowel sounds are positive, abdomen soft and non-tender without masses. No hepatosplenomegaly. Obese Msk:  Back normal. Normal strength and tone for age. Extremities:  No clubbing, cyanosis . Trace edema right lower extremity, prior saphenous vein graft extraction site noted.  DP +1 Neuro: Alert and oriented X 3, non-focal, MAE x 4  GU: Deferred Rectal: Deferred Psych:  Good affect, responds appropriately    Labs:   Lab Results  Component Value Date   WBC 9.1 10/21/2012   HGB 14.4 10/21/2012   HCT 41.8 10/21/2012   MCV 90.9 10/21/2012   PLT 222 10/21/2012    Recent Labs Lab 10/21/12 1408  NA 136  K 3.7  CL 98  CO2 24  BUN 15  CREATININE 0.61  CALCIUM 10.0  GLUCOSE 239*    Point-of-care troponin 1.6.  Radiology:  Dg Chest 2 View  10/21/2012  *RADIOLOGY REPORT*  Clinical Data: Chest pain, weakness, dizziness  CHEST - 2 VIEW  Comparison: None.  Findings: No active infiltrate or effusion is seen.  The heart is mildly enlarged.  Median sternotomy sutures are noted from prior CABG.  No acute skeletal abnormality is seen.  IMPRESSION: Cardiomegaly.  No active lung disease.   Original Report Authenticated By: Dwyane Dee, M.D.    Ct Head Wo Contrast  10/21/2012  *RADIOLOGY REPORT*  Clinical Data: Dizziness, fall  CT HEAD WITHOUT CONTRAST  Technique:  Contiguous axial images were obtained from the base of the skull through the vertex without contrast.  Comparison: All none  Findings: No acute intracranial hemorrhage.  No focal mass lesion. No CT evidence of acute infarction.   No midline shift or mass effect.  No  hydrocephalus.  Basilar cisterns are patent.  Small hypodensity inferior to the left lentiform nucleus either represents a small lacunar infarction or a perivascular space. Mild periventricular white matter hypodensities.  No skull base fracture.  No fluid in the paranasal sinuses.  IMPRESSION:  1.  No evidence of intracranial trauma. 2.  No acute findings. 3.  Mild microvascular disease.   Original Report Authenticated By: Genevive Bi, M.D.    Personally viewed.  EKG:  Right bundle branch block, left anterior fascicular block, sinus rhythm, prior reviewed Personally viewed.   ASSESSMENT/PLAN:   67 year old female with elevated point-of-care troponin of 1.6 with a prior history of bypass surgery/coronary artery disease who had possibly 2 separate episodes of syncope earlier today with conduction disorder-right bundle branch block/left anterior fascicular block on metoprolol.  1. Syncope-possible etiologies include arrhythmic. She does have underlying conduction disorder with right bundle branch block and left anterior fascicular block and it is completely plausible that she may have had transient episodes of slow ventricular response or perhaps complete heart block. We will monitor closely on telemetry. For now, she is not demonstrating any bradycardia. I'm fine with her continuing with her metoprolol. Other possibility include tachyarrhythmia although she did not feel any palpitations. Vagal response is also a possibility especially with the syncope occurring on the toilet. We will check an echocardiogram to ensure proper structure and function. She has completed bag of IV fluid. I have also discussed that untreated sleep apnea mainly to bradycardia.  2. Positive troponin-this was a point-of-care troponin. We will go ahead and continue to cycle cardiac markers. I will add one set of CK/MB. While she was seen in her recliner snoring or while laying on the bathroom floor, if she did have transient  bradycardia that was significant this may have led to troponin elevation. She does not have any chest pain, no shortness of breath or any other subjective ischemic symptoms. If her further troponin analysis is positive, I would advocate IV heparinization. It would not be unreasonable either to consider cardiac catheterization if troponin remains positive to exclude ischemia as a possible etiology for her syncope. Her diabetes,  uncontrolled, may blunt her chest pain response. She states that Dr. Donnie Aho did a stress test last year which was unremarkable. I do see on 04/22/11 that she underwent nuclear stress testing.  We will follow closely.   Donato Schultz, MD  10/21/2012  4:44 PM

## 2012-10-21 NOTE — Progress Notes (Addendum)
Patient ID: Laurie [name], female   DOB: [birth year], 67 y.o.   MRN: [id number]  On-call Cardiology Note  Patient was admitted for syncope earlier today. She has a PMH of CAD and is on Metoprolol which she took earlier today.  She has baseline EKG of RBBB and LAFB.  Tonight, she had an episode of dizziness and felt warm while laying in her bed.  She was not straining and there was no other episode of vagal manuever.  On telemetry, she had high grade AV block with a >9 seconds episode of P waves with no corresponding QRS complex.  Currently, she has normal AV conduction and is asymptomatic.  We will stop her Metoprolol, place Zoll-pads (already done) and move her to a higher level of care in our cardiac ICU for close observation.  Her symptoms is most likely due to AV nodal blockage with beta-blockers in a patient with underlying conduction system disease.  Since, she needs to be on long term beta-blocker for her CAD, we plan on placing a permanent pacemaker, and re-initiate beta-blockers after placement of permanent pacemaker.  If she has recurrent high grade AV block while she is off of beta-blockers, we will call in the cath lab to place a temporary pacemaker while we are waiting permanent pacemaker placement.  She will be NPO tonight if case she needs to go to the cath lab for emergent temporary pacemaker placement.   Gemma Payor, M.D.   Addendum:  Patient had another episode of  >14 seconds high grade AV block with P-waves a/d no corresponding QRS complex while she was laying in bed in the ICU at about 02:35 am. The patient was briefly unresponsive during this episode, but promptly regain consciousness right afterward.  She has a history of CAD s/p CABG x 4 in 07/22/1999 with LIMA-->LAD, SVG-->diagonal, SVG--OM and SVG-->PDA. Her troponins are currently positive.  I discussed case with the on-call interventionalist (Dr. Tresa Endo). I ask him to come in and take the patient to the cath lab for a  temporary transvenous pacemaker and a diagnostic cardiac cath since the patient also has abnormal troponin.  Gemma Payor, MD

## 2012-10-21 NOTE — H&P (Addendum)
[facility] History and Physical  Laurie [name] EAV:[id number] DOB: [birth year] DOA: 10/21/2012  Referring physician: Dr. Lorre Nick, EDP PCP: Rogelia Boga, MD  Specialists:  1. Cardiology: Dr. Donnie Aho 2. Endocrinology: Dr. Romero Belling.  Chief Complaint: Passed out  HPI: Laurie [name] is a 67 y.o. female has extensive PMH-Type 2 DM/severe insulin resistance, HTN, HL, CAD, S/P CABG in 2000, gets yearly nuclear stress test-last one in August of 2013 said to be negative, OSA-noncompliant with CPAP since 2010, peripheral neuropathy and morbid obesity. She was brought to the emergency room on 2/8 by her spouse due to 2 episodes of passing out. Patient complained of "raw feeling" on right side of her chest 2 days ago which kept her awake at night. This feeling then migrated to the right shoulder. She took a dose of aspirin and the discomfort resolved. Last night she had low-grade temperature of 100F without any associated symptoms. At approximately 10 AM on 2/8, she was sitting on her recliner when she felt dizzy and passed out. Her son who was sitting next to her noticed that she slumped her head backwards and started snoring. This episode lasted for less than a minute. Patient woke up and felt funny. She denies any preceding headache, chest pain or palpitations. Her CBG prior to this episode was 234 mg/dL. By this time her spouse came in and advised that they should go to the hospital to get checked out. She went to the toilet and was sitting on the toilet when her husband heard her snore followed by a loud noise. Patient indicates that she felt dizzy and then passed out. He found her lying face forward on the floor with bruise on the right forehead. This episode also lasted less than a minute. Neither one were associated with seizure-like activity or strokelike symptoms. She woke up and was coherent. They decided to come to the emergency room where vital signs are stable, head CT  negative but POC troponin elevated at 1.6. EKG shows old right bundle branch block and left anterior fascicular block. EDP has consulted cardiology and the hospitalist service is requested to admit for further evaluation and management. Patient does give remote history of dizziness but has never passed out before.   Review of Systems: All systems reviewed and apart from history of presenting illness, are negative. She does give history of chronic right lower extremity mild swelling for greater than a year attributed to vein stripping for CABG. No pain in that limb.   Past Medical History  Diagnosis Date  . ALLERGIC RHINITIS 01/30/2007  . CORONARY ARTERY DISEASE 01/30/2007  . DIABETES MELLITUS, TYPE II 01/30/2007  . HYPERTENSION 01/30/2007  . HYPERLIPIDEMIA 01/30/2007  . Bell's palsy 01/05/2010  . GOITER, MULTINODULAR 05/14/2010  . HEPATITIS 12/06/2007  . Thyromegaly     mild  . 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 Hyperlipidemia 04/18/2012    Intolerance to several statins   . Hypertensive heart disease without CHF   . HIstory of Bell's palsy     October 2012   . Obesity (BMI 30-39.9)   . Peripheral neuropathy   . Sleep apnea    Past Surgical History  Procedure Laterality Date  . Coronary artery bypass graft  2000  . Tubal ligation    . Biopsy thyroid  05/2010    percutaneous  . Foot surgery    . Carpal tunnel release    . Dental surgery  Social History:  reports that she has never smoked. She has never used smokeless tobacco. She reports that she does not drink alcohol or use illicit drugs. Married. Lives with her spouse and is independent of activities of daily living.  Allergies  Allergen Reactions  . Tricor (Fenofibrate)     Leg pain    Family History  Problem Relation Age of Onset  . Diabetes Father   . Heart disease Father   . Diabetes Other     Siblings and 2 children  . Heart disease Other     CAD  .  Diabetes Mother     Prior to Admission medications   Medication Sig Start Date End Date Taking? Authorizing Provider  AMBULATORY NON FORMULARY MEDICATION Diabetic Nutrition Pack with Cinannon, Chromium and Vitamin D3 1500 IU   Yes Historical Provider, MD  AMBULATORY NON FORMULARY MEDICATION Omega Q Plus  Once daily   Yes Historical Provider, MD  AMBULATORY NON FORMULARY MEDICATION Per Q Sorb   Yes Historical Provider, MD  b complex vitamins tablet Take 1 tablet by mouth daily.   Yes Historical Provider, MD  beta carotene w/minerals (OCUVITE) tablet Take 1 tablet by mouth daily.   Yes Historical Provider, MD  chlorthalidone (HYGROTON) 25 MG tablet Take 25 mg by mouth daily.   Yes Historical Provider, MD  Cholecalciferol (VITAMIN D3) 50000 UNITS CAPS Take 1 tablet by mouth daily.   Yes Historical Provider, MD  Glucosamine-Chondroitin (MOVE FREE PO) Take 1 capsule by mouth daily.   Yes Historical Provider, MD  insulin glargine (LANTUS) 100 UNIT/ML injection Inject 60 Units into the skin at bedtime. 12/20/11 12/19/12 Yes Romero Belling, MD  insulin lispro (HUMALOG) 100 UNIT/ML injection 140 units with breakfast, 190 units with lunch, and 290 units with evening meal- add  30 additional units if blood sugar is greater than 300 03/03/12  Yes Gordy Savers, MD  ketoconazole (NIZORAL) 2 % cream Apply 1 application topically daily as needed (for face rash).   Yes Historical Provider, MD  lisinopril (PRINIVIL,ZESTRIL) 40 MG tablet Take 40 mg by mouth daily.   Yes Historical Provider, MD  loratadine (CLARITIN) 10 MG tablet Take 10 mg by mouth daily.   Yes Historical Provider, MD  metoprolol succinate (TOPROL-XL) 50 MG 24 hr tablet Take 50 mg by mouth daily. Take with or immediately following a meal.   Yes Historical Provider, MD  Multiple Vitamin (MULTIVITAMIN) tablet Take 1 tablet by mouth daily.     Yes Historical Provider, MD  omeprazole (PRILOSEC) 20 MG capsule Take 20 mg by mouth daily as needed (acid  flux). 10/22/11 10/21/12 Yes Gordy Savers, MD  potassium chloride SA (K-DUR,KLOR-CON) 20 MEQ tablet Take 20 mEq by mouth 2 (two) times daily.   Yes Historical Provider, MD  Probiotic Product (PROBIOTIC DAILY) CAPS Take 1 capsule by mouth daily.   Yes Historical Provider, MD  sodium chloride (OCEAN) 0.65 % nasal spray Place 1 spray into the nose as needed for congestion.   Yes Historical Provider, MD   Physical Exam: Filed Vitals:   10/21/12 1430 10/21/12 1431 10/21/12 1432 10/21/12 1433  BP: 130/51 130/51 147/64 165/77  Pulse: 92 88 94 94  Temp:      TempSrc:      Resp: 21     Weight:      SpO2: 93%        General exam: Moderately built and morbidly obese female patient, lying comfortably supine on the gurney in no obvious  distress.  Head, eyes and ENT: Normocephalic. Mild superficial abrasion on right forehead but otherwise nontraumatic. Pupils equally reacting to light and accommodation. Oral mucosa moist.  Neck: Supple. No JVD, carotid bruit or thyromegaly.  Lymphatics: No lymphadenopathy.  Respiratory system: Clear to auscultation. No increased work of breathing.  Cardiovascular system: S1 and S2 heard, RRR. No JVD, murmurs, gallops, clicks or pedal edema.  Gastrointestinal system: Abdomen is obese but nondistended, soft and nontender. Normal bowel sounds heard. No organomegaly or masses appreciated.  Central nervous system: Alert and oriented. No focal neurological deficits.  Extremities: Symmetric 5 x 5 power. Peripheral pulses symmetrically felt. Scar of vein stripping on right medial calf. Right leg mildly asymmetrically swollen compared to left with trace pedal edema.  Skin: No rashes or acute findings on exposed skin.  Musculoskeletal system: Negative exam.  Psychiatry: Pleasant and cooperative.   Labs on Admission:  Basic Metabolic Panel:  Recent Labs Lab 10/21/12 1408  NA 136  K 3.7  CL 98  CO2 24  GLUCOSE 239*  BUN 15  CREATININE 0.61  CALCIUM  10.0   Liver Function Tests: No results found for this basename: AST, ALT, ALKPHOS, BILITOT, PROT, ALBUMIN,  in the last 168 hours No results found for this basename: LIPASE, AMYLASE,  in the last 168 hours No results found for this basename: AMMONIA,  in the last 168 hours CBC:  Recent Labs Lab 10/21/12 1408  WBC 9.1  HGB 14.4  HCT 41.8  MCV 90.9  PLT 222   Cardiac Enzymes: No results found for this basename: CKTOTAL, CKMB, CKMBINDEX, TROPONINI,  in the last 168 hours  BNP (last 3 results) No results found for this basename: PROBNP,  in the last 8760 hours CBG: No results found for this basename: GLUCAP,  in the last 168 hours  Radiological Exams on Admission: Dg Chest 2 View  10/21/2012  *RADIOLOGY REPORT*  Clinical Data: Chest pain, weakness, dizziness  CHEST - 2 VIEW  Comparison: None.  Findings: No active infiltrate or effusion is seen.  The heart is mildly enlarged.  Median sternotomy sutures are noted from prior CABG.  No acute skeletal abnormality is seen.  IMPRESSION: Cardiomegaly.  No active lung disease.   Original Report Authenticated By: Dwyane Dee, M.D.    Ct Head Wo Contrast  10/21/2012  *RADIOLOGY REPORT*  Clinical Data: Dizziness, fall  CT HEAD WITHOUT CONTRAST  Technique:  Contiguous axial images were obtained from the base of the skull through the vertex without contrast.  Comparison: All none  Findings: No acute intracranial hemorrhage.  No focal mass lesion. No CT evidence of acute infarction.   No midline shift or mass effect.  No hydrocephalus.  Basilar cisterns are patent.  Small hypodensity inferior to the left lentiform nucleus either represents a small lacunar infarction or a perivascular space. Mild periventricular white matter hypodensities.  No skull base fracture.  No fluid in the paranasal sinuses.  IMPRESSION:  1.  No evidence of intracranial trauma. 2.  No acute findings. 3.  Mild microvascular disease.   Original Report Authenticated By: Genevive Bi,  M.D.     EKG: Independently reviewed. Normal sinus rhythm with old RBBB and LAFB.  Assessment/Plan Principal Problem:   Syncope Active Problems:   Hypertensive heart disease without CHF   CAD (coronary artery disease)   Hyperlipidemia   Obesity (BMI 30-39.9)   Sleep apnea   NSTEMI (non-ST elevated myocardial infarction)   DM (diabetes mellitus), type 2, uncontrolled   HTN (  hypertension)   1. Syncope: Suspicious for cardiac etiology-arrhythmia i.e. high degree heart block or tachyarrhythmias. Admit to telemetry. Check orthostatic blood pressures although seems less likely as the cause. We'll check 2-D echo, carotid Dopplers and cycle cardiac markers especially given POC troponin 1.6. Cardiology has been consulted. 2. Elevated troponin/NSTEMI: Patient did have atypical right-sided chest discomfort for 2 days. Monitor on telemetry. Cycle cardiac enzymes. Check 2-D echocardiogram. Aspirin. Continue beta blockers. Cardiology has been consulted for assistance with further evaluation and management. Discussed with Dr. Anne Fu: will start IV Heparin if repeat troponin is positive. ? Cath. 3. Uncontrolled type 2 diabetes mellitus/severely insulin resistant: Will continue home dose of Lantus and mealtime NovoLog. We'll add sensitive SSI. A1c in the past has been steady in the low 8 range. Outpatient followup with her family endocrinologist on discharge. 4. Hypertension: Mildly uncontrolled. Continue lisinopril and metoprolol. 5. OSA: Noncompliant with CPAP. Counseled patient and spouse that she needs to be refitted as outpatient and comply with CPAP. She verbalizes understanding. 6. Hyperlipidemia: 7. History of CAD, S/P CABG: Management per problem #2. 8. Morbid obesity.     Code Status: Full  Family Communication: Discussed with patient spouse, daughter and a friend at the bedside.  Disposition Plan: Home when medically stable.   Time spent: 70 minutes  [facility] Pager [phone]  If 7PM-7AM, please contact night-coverage [email or web address] Password [facility] 10/21/2012, 5:01 PM

## 2012-10-21 NOTE — Progress Notes (Signed)
ANTICOAGULATION CONSULT NOTE - Initial Consult  Pharmacy Consult:  Heparin Indication:  ACS  Allergies  Allergen Reactions  . Tricor (Fenofibrate)     Leg pain    Patient Measurements: Height: 5' 3.39" (161 cm) Weight: 201 lb (91.173 kg) IBW/kg (Calculated) : 53.29 Heparin Dosing Weight: 73 kg  Vital Signs: Temp: 98.6 F (37 C) (02/08 1332) Temp src: Oral (02/08 1332) BP: 137/55 mmHg (02/08 1730) Pulse Rate: 78 (02/08 1702)  Labs:  Recent Labs  10/21/12 1408 10/21/12 1644  HGB 14.4  --   HCT 41.8  --   PLT 222  --   CREATININE 0.61  --   CKTOTAL  --  134  CKMB  --  5.8*  TROPONINI  --  1.52*    Estimated Creatinine Clearance: 74.8 ml/min (by C-G formula based on Cr of 0.61).   Medical History: Past Medical History  Diagnosis Date  . ALLERGIC RHINITIS 01/30/2007  . CORONARY ARTERY DISEASE 01/30/2007  . DIABETES MELLITUS, TYPE II 01/30/2007  . HYPERTENSION 01/30/2007  . HYPERLIPIDEMIA 01/30/2007  . Bell's palsy 01/05/2010  . GOITER, MULTINODULAR 05/14/2010  . HEPATITIS 12/06/2007  . Thyromegaly     mild  . 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 Hyperlipidemia 04/18/2012    Intolerance to several statins   . Hypertensive heart disease without CHF   . HIstory of Bell's palsy     October 2012   . Obesity (BMI 30-39.9)   . Peripheral neuropathy   . Sleep apnea        Assessment: 50 YOF admitted with syncope to start IV heparin for positive cardiac enzymes.  Patient is not on anticoagulation PTA.  CBC WNL.   Goal of Therapy:  Heparin level 0.3-0.7 units/ml Monitor platelets by anticoagulation protocol: Yes    Plan:  - Heparin 3500 unit IV bolus, then - Heparin gtt at 900 units/hr - Check 6 hr HL - Daily HL / CBC     Celia Friedland D. Laney Potash, PharmD, BCPS Pager:  [phone] 10/21/2012, 6:05 PM

## 2012-10-21 NOTE — ED Provider Notes (Addendum)
History     CSN: [id number]  Arrival date & time 10/21/12  1322   First MD Initiated Contact with Patient 10/21/12 1346      Chief Complaint  Patient presents with  . Loss of Consciousness    (Consider location/radiation/quality/duration/timing/severity/associated sxs/prior treatment) Patient is a 67 y.o. female presenting with syncope. The history is provided by the patient.  Loss of Consciousness    patient here with syncopal episodes x2. She felt dizzy before each event and they were at rest. There is no reported seizure activity. She denies any recent illnesses. No prior history of same. Each episode was brief. She complains of some right-sided head pain from the last one. Denies any neck pain. Denies any chest pain abdominal pain neck pain or shortness of breath. Denies any recent medication changes. Symptoms are spontaneous and resolve spontaneously.  Past Medical History  Diagnosis Date  . ALLERGIC RHINITIS 01/30/2007  . CORONARY ARTERY DISEASE 01/30/2007  . DIABETES MELLITUS, TYPE II 01/30/2007  . HYPERTENSION 01/30/2007  . HYPERLIPIDEMIA 01/30/2007  . Bell's palsy 01/05/2010  . GOITER, MULTINODULAR 05/14/2010  . HEPATITIS 12/06/2007  . Thyromegaly     mild  . 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 Hyperlipidemia 04/18/2012    Intolerance to several statins   . Hypertensive heart disease without CHF   . HIstory of Bell's palsy     October 2012   . Obesity (BMI 30-39.9)   . Peripheral neuropathy     Past Surgical History  Procedure Laterality Date  . Coronary artery bypass graft  2000  . Tubal ligation    . Biopsy thyroid  05/2010    percutaneous    Family History  Problem Relation Age of Onset  . Diabetes Father   . Diabetes Other     Siblings and 2 children  . Heart disease Other     CAD    History  Substance Use Topics  . Smoking status: Never Smoker   . Smokeless tobacco: Never Used  . Alcohol Use:  No    OB History   Grav Para Term Preterm Abortions TAB SAB Ect Mult Living                  Review of Systems  Cardiovascular: Positive for syncope.  All other systems reviewed and are negative.    Allergies  Tricor  Home Medications   Current Outpatient Rx  Name  Route  Sig  Dispense  Refill  . AMBULATORY NON FORMULARY MEDICATION      Omega Q Plus  Once daily         . AMBULATORY NON FORMULARY MEDICATION      Per Q Sorb         . b complex vitamins tablet   Oral   Take 1 tablet by mouth daily.         . beta carotene w/minerals (OCUVITE) tablet   Oral   Take 1 tablet by mouth daily.         . insulin glargine (LANTUS) 100 UNIT/ML injection   Subcutaneous   Inject 60 Units into the skin at bedtime.         . insulin lispro (HUMALOG) 100 UNIT/ML injection      140 units with breakfast, 190 units with lunch, and 290 units with evening meal- add  30 additional units if blood sugar is greater than 300         .  loratadine (CLARITIN) 10 MG tablet   Oral   Take 10 mg by mouth daily.         . AMBULATORY NON FORMULARY MEDICATION      Diabetic Nutrition Pack with Cinannon, Chromium and Vitamin D3 1500 IU         . Glucosamine-Chondroitin (MOVE FREE PO)   Oral   Take 1 capsule by mouth daily.         . Multiple Vitamin (MULTIVITAMIN) tablet   Oral   Take 1 tablet by mouth daily.           [name] omeprazole (PRILOSEC) 20 MG capsule   Oral   Take 1 capsule (20 mg total) by mouth daily.   90 capsule   3   . Probiotic Product (PROBIOTIC DAILY) CAPS   Oral   Take 1 capsule by mouth daily.           BP 147/53  Pulse 84  Temp(Src) 98.6 F (37 C) (Oral)  Resp 18  Wt 201 lb (91.173 kg)  BMI 35.04 kg/m2  SpO2 95%  Physical Exam  Nursing note and vitals reviewed. Constitutional: She is oriented to person, place, and time. She appears well-developed and well-nourished.  Non-toxic appearance. No distress.  HENT:  Head: Normocephalic  and atraumatic.  Eyes: Conjunctivae, EOM and lids are normal. Pupils are equal, round, and reactive to light.  Neck: Normal range of motion. Neck supple. No tracheal deviation present. No mass present.  Cardiovascular: Normal rate, regular rhythm and normal heart sounds.  Exam reveals no gallop.   No murmur heard. Pulmonary/Chest: Effort normal and breath sounds normal. No stridor. No respiratory distress. She has no decreased breath sounds. She has no wheezes. She has no rhonchi. She has no rales.  Abdominal: Soft. Normal appearance and bowel sounds are normal. She exhibits no distension. There is no tenderness. There is no rebound and no CVA tenderness.  Musculoskeletal: Normal range of motion. She exhibits no edema and no tenderness.  Neurological: She is alert and oriented to person, place, and time. She has normal strength. No cranial nerve deficit or sensory deficit. GCS eye subscore is 4. GCS verbal subscore is 5. GCS motor subscore is 6.  Skin: Skin is warm and dry. No abrasion and no rash noted.  Psychiatric: She has a normal mood and affect. Her speech is normal and behavior is normal.    ED Course  Procedures (including critical care time)  Labs Reviewed  CBC  BASIC METABOLIC PANEL   No results found.   No diagnosis found.    MDM   Date: 10/21/2012  Rate: 87  Rhythm: normal sinus rhythm  QRS Axis: normal  Intervals: normal  ST/T Wave abnormalities: nonspecific ST changes  Conduction Disutrbances:right bundle branch block  Narrative Interpretation:   Old EKG Reviewed: none available   3:39 PM Patient to be admitted for evaluation of syncope  Cardiac poc noted and consult to cardiology obtained, dr. Anne Fu to see pt     Toy Baker, MD 10/21/12 1539  Toy Baker, MD 10/21/12 [phone]

## 2012-10-21 NOTE — ED Notes (Signed)
Reports two episodes this am of feeling dizzy and syncope, family reports pt passed out and hit her head on the bathroom floor.reports intermittent episodes this week of right side chest wall pain, right shoulder pain and feeling like her throat is swelling. Airway is intact at triage.

## 2012-10-21 NOTE — ED Notes (Signed)
Pharmacy about pts heparin told it was just sent.

## 2012-10-22 ENCOUNTER — Encounter (HOSPITAL_COMMUNITY)
Admission: EM | Disposition: A | Discharge status: Home / Self Care | Payer: Self-pay | Source: Home / Self Care | Attending: Cardiology

## 2012-10-22 DIAGNOSIS — I442 Atrioventricular block, complete: Principal | ICD-10-CM

## 2012-10-22 DIAGNOSIS — R55 Syncope and collapse: Secondary | ICD-10-CM

## 2012-10-22 HISTORY — PX: TEMPORARY PACEMAKER INSERTION: SHX5471

## 2012-10-22 LAB — HEPARIN LEVEL (UNFRACTIONATED): Heparin Unfractionated: <0.1 IU/mL — ABNORMAL LOW (ref 0.30–0.70)

## 2012-10-22 LAB — PROTIME-INR: INR: 0.87 (ref 0.00–1.49)

## 2012-10-22 LAB — CBC
HCT: 38.1 % (ref 36.0–46.0)
HCT: 36.7 % (ref 36.0–46.0)
Hemoglobin: 13.1 g/dL (ref 12.0–15.0)
Hemoglobin: 12.7 g/dL (ref 12.0–15.0)
MCH: 31.3 pg (ref 26.0–34.0)
MCHC: 34.4 g/dL (ref 30.0–36.0)
MCV: 90.9 fL (ref 78.0–100.0)
MCV: 90.6 fL (ref 78.0–100.0)
RBC: 4.05 MIL/uL (ref 3.87–5.11)
RDW: 12.8 % (ref 11.5–15.5)
WBC: 7.7 10*3/uL (ref 4.0–10.5)

## 2012-10-22 LAB — BASIC METABOLIC PANEL
CO2: 26 mEq/L (ref 19–32)
Chloride: 100 mEq/L (ref 96–112)
Creatinine, Ser: 0.61 mg/dL (ref 0.50–1.10)
Sodium: 136 mEq/L (ref 135–145)

## 2012-10-22 LAB — TROPONIN I: Troponin I: 1.7 ng/mL (ref <0.30)

## 2012-10-22 LAB — CK TOTAL AND CKMB (NOT AT ARMC): Total CK: 140 U/L (ref 7–177)

## 2012-10-22 LAB — GLUCOSE, CAPILLARY: Glucose-Capillary: 165 mg/dL — ABNORMAL HIGH (ref 70–99)

## 2012-10-22 LAB — TSH: TSH: 1.429 u[IU]/mL (ref 0.350–4.500)

## 2012-10-22 SURGERY — TEMPORARY PACEMAKER INSERTION
Anesthesia: LOCAL

## 2012-10-22 MED ORDER — SODIUM CHLORIDE 0.9 % IV SOLN: 1.0000 mL/kg/h | INTRAVENOUS | Status: DC

## 2012-10-22 MED ORDER — HEPARIN (PORCINE) IN NACL 100-0.45 UNIT/ML-% IJ SOLN
1400.0000 [IU]/h | INTRAMUSCULAR | Status: DC
  Administered 2012-10-22: 1200 [IU]/h via INTRAVENOUS
  Administered 2012-10-22: 900 [IU]/h via INTRAVENOUS
  Filled 2012-10-22 (×3): qty 250

## 2012-10-22 MED ORDER — HEPARIN (PORCINE) IN NACL 2-0.9 UNIT/ML-% IJ SOLN
INTRAMUSCULAR | Status: AC
  Filled 2012-10-22: qty 500

## 2012-10-22 MED ORDER — SODIUM CHLORIDE 0.9 % IV SOLN
INTRAVENOUS | Status: DC
  Administered 2012-10-22: 05:00:00 via INTRAVENOUS

## 2012-10-22 MED ORDER — DIAZEPAM 5 MG PO TABS
5.0000 mg | ORAL_TABLET | ORAL | Status: AC
  Administered 2012-10-23: 5 mg via ORAL
  Filled 2012-10-22: qty 1

## 2012-10-22 MED ORDER — INSULIN ASPART 100 UNIT/ML ~~LOC~~ SOLN
0.0000 [IU] | Freq: Three times a day (TID) | SUBCUTANEOUS | Status: DC
  Administered 2012-10-22 – 2012-10-23 (×3): 4 [IU] via SUBCUTANEOUS
  Administered 2012-10-24: 3 [IU] via SUBCUTANEOUS
  Administered 2012-10-24 (×2): 4 [IU] via SUBCUTANEOUS

## 2012-10-22 MED ORDER — SODIUM CHLORIDE 0.9 % IJ SOLN: 3.0000 mL | INTRAMUSCULAR | Status: DC | PRN

## 2012-10-22 MED ORDER — INSULIN ASPART 100 UNIT/ML ~~LOC~~ SOLN: 0.0000 [IU] | Freq: Every day | SUBCUTANEOUS | Status: DC

## 2012-10-22 MED ORDER — HEPARIN BOLUS VIA INFUSION
3500.0000 [IU] | Freq: Once | INTRAVENOUS | Status: AC
  Administered 2012-10-22: 3500 [IU] via INTRAVENOUS
  Filled 2012-10-22: qty 3500

## 2012-10-22 MED ORDER — ASPIRIN 81 MG PO CHEW
324.0000 mg | CHEWABLE_TABLET | ORAL | Status: AC
  Administered 2012-10-23: 324 mg via ORAL
  Filled 2012-10-22: qty 4

## 2012-10-22 MED ORDER — MIDAZOLAM HCL 2 MG/2ML IJ SOLN
INTRAMUSCULAR | Status: AC
  Filled 2012-10-22: qty 2

## 2012-10-22 MED ORDER — LIDOCAINE HCL (PF) 1 % IJ SOLN
INTRAMUSCULAR | Status: AC
  Filled 2012-10-22: qty 30

## 2012-10-22 MED ORDER — SODIUM CHLORIDE 0.9 % IV SOLN: 250.0000 mL | INTRAVENOUS | Status: DC | PRN

## 2012-10-22 MED ORDER — FENTANYL CITRATE 0.05 MG/ML IJ SOLN
INTRAMUSCULAR | Status: AC
  Filled 2012-10-22: qty 2

## 2012-10-22 MED ORDER — HEPARIN BOLUS VIA INFUSION
3000.0000 [IU] | Freq: Once | INTRAVENOUS | Status: AC
  Administered 2012-10-22: 3000 [IU] via INTRAVENOUS
  Filled 2012-10-22: qty 3000

## 2012-10-22 MED ORDER — SODIUM CHLORIDE 0.9 % IV SOLN
INTRAVENOUS | Status: DC
  Administered 2012-10-22: via INTRAVENOUS

## 2012-10-22 MED ORDER — SODIUM CHLORIDE 0.9 % IJ SOLN: 3.0000 mL | Freq: Two times a day (BID) | INTRAMUSCULAR | Status: DC

## 2012-10-22 NOTE — Consult Note (Signed)
Reason for Consult:syncope and heart block  Referring Physician: Harleyquinn Gasser is an 67 y.o. female.   HPI: The patient is a 67 yo woman with a h/o syncope admitted for evaluation and subsequently found to have over 8 second pauses. She has known CAD and has been on Toprol XL 50 mg daily. She denies anginal symptoms despite a slightly elevated troponin. She was in her usual health until yesterday when she experienced a sudden syncopal episode. No chest pain or sob. She does have known CAD with CABG in the past.   PMH: Past Medical History  Diagnosis Date  . ALLERGIC RHINITIS 01/30/2007  . CORONARY ARTERY DISEASE 01/30/2007  . DIABETES MELLITUS, TYPE II 01/30/2007  . HYPERTENSION 01/30/2007  . HYPERLIPIDEMIA 01/30/2007  . Bell's palsy 01/05/2010  . GOITER, MULTINODULAR 05/14/2010  . HEPATITIS 12/06/2007  . Thyromegaly     mild  . 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 Hyperlipidemia 04/18/2012    Intolerance to several statins   . Hypertensive heart disease without CHF   . HIstory of Bell's palsy     October 2012   . Obesity (BMI 30-39.9)   . Peripheral neuropathy   . Sleep apnea     PSHX: Past Surgical History  Procedure Laterality Date  . Coronary artery bypass graft  2000  . Tubal ligation    . Biopsy thyroid  05/2010    percutaneous  . Foot surgery    . Carpal tunnel release    . Dental surgery      FAMHX: Family History  Problem Relation Age of Onset  . Diabetes Father   . Heart disease Father   . Diabetes Other     Siblings and 2 children  . Heart disease Other     CAD  . Diabetes Mother     Social History:  reports that she has never smoked. She has never used smokeless tobacco. She reports that she does not drink alcohol or use illicit drugs.  Allergies:  Allergies  Allergen Reactions  . Tricor (Fenofibrate)     Leg pain    Medications: reviewed  Dg Chest 2 View  10/21/2012  *RADIOLOGY  REPORT*  Clinical Data: Chest pain, weakness, dizziness  CHEST - 2 VIEW  Comparison: None.  Findings: No active infiltrate or effusion is seen.  The heart is mildly enlarged.  Median sternotomy sutures are noted from prior CABG.  No acute skeletal abnormality is seen.  IMPRESSION: Cardiomegaly.  No active lung disease.   Original Report Authenticated By: Dwyane Dee, M.D.    Ct Head Wo Contrast  10/21/2012  *RADIOLOGY REPORT*  Clinical Data: Dizziness, fall  CT HEAD WITHOUT CONTRAST  Technique:  Contiguous axial images were obtained from the base of the skull through the vertex without contrast.  Comparison: All none  Findings: No acute intracranial hemorrhage.  No focal mass lesion. No CT evidence of acute infarction.   No midline shift or mass effect.  No hydrocephalus.  Basilar cisterns are patent.  Small hypodensity inferior to the left lentiform nucleus either represents a small lacunar infarction or a perivascular space. Mild periventricular white matter hypodensities.  No skull base fracture.  No fluid in the paranasal sinuses.  IMPRESSION:  1.  No evidence of intracranial trauma. 2.  No acute findings. 3.  Mild microvascular disease.   Original Report Authenticated By: Genevive Bi, M.D.     ROS  As stated in  the HPI and negative for all other systems.  Physical Exam  Vitals:Blood pressure 125/53, pulse 88, temperature 99.2 F (37.3 C), temperature source Oral, resp. rate 20, height 5' 3.5" (1.613 m), weight 205 lb 11 oz (93.3 kg), SpO2 95.00%.  Well appearing obese middled woman, NAD HEENT: Unremarkable Neck:  No JVD, no thyromegally Lungs:  Clear with no wheezes, rales, or rhonchi HEART:  Regular rate rhythm, no murmurs, no rubs, no clicks Abd:  soft, obese, positive bowel sounds, no organomegally, no rebound, no guarding Ext:  2 plus pulses, no edema, no cyanosis, no clubbing, right femoral pacing catheter in place. Skin:  No rashes no nodules Neuro:  CN II through XII intact,  motor grossly intact  Assessment/Plan: 1. Symptomatic complete heart block 2. S/p temporary transvenous Pm 3. CAD, s/p CABG with elevated troponin 4. Obesity Rec: I have discussed the issues with the patient. She has been on toprol but I suspect this is not playing a significant role. Would recommend left heart cath, and if no RCA/ AV node ishcemia, would proceed with PPM despite having been previously on toprol.   Sharlot Gowda TaylorMD 10/22/2012, 2:52 PM

## 2012-10-22 NOTE — Progress Notes (Signed)
Pt and family members  watching video on " living with a pacemaker"

## 2012-10-22 NOTE — CV Procedure (Signed)
Emergent Temporary Pacemaker:  Indications: 67 yo WF who is s/p CABG in 2000 who was admitted today with syncope. In CCU, she had advanced heart block with P waves and ventricular asystole up to  9 and 14 seconds. I was called to place temporary pacemaker.  Procedure: Pt was premedicated with Versed 2mg  and fentanyl 25 micrograms x 2. Access was very difficult and multiple attempts were made in both right and left groin. Ultimaltely, this was successful vis Right femoral vein and a 6 french sheath was inserted. A temporary transvenous pacemaker was inserted and was advanced to the right ventricular apex. Capture was excellent. Rate was set at 60 and MA5. Pt tolerated the procedure well.

## 2012-10-22 NOTE — Progress Notes (Signed)
ANTICOAGULATION CONSULT NOTE - Follow-Up Consult  Pharmacy Consult:  Heparin Indication:  ACS  Allergies  Allergen Reactions  . Tricor (Fenofibrate)     Leg pain    Labs:  Recent Labs  10/21/12 1408 10/21/12 1644 10/21/12 2344 10/22/12 0445 10/22/12 0500 10/22/12 1842  HGB 14.4  --   --   --  13.1 12.7  HCT 41.8  --   --   --  38.1 36.7  PLT 222  --   --   --  205 194  LABPROT  --   --   --   --   --  11.8  INR  --   --   --   --   --  0.87  HEPARINUNFRC  --   --   --   --  <0.10* <0.10*  CREATININE 0.61  --   --   --   --   --   CKTOTAL  --  134  --  140  --   --   CKMB  --  5.8*  --  4.7*  --   --   TROPONINI  --  1.52* 1.70* 1.51*  --   --     Estimated Creatinine Clearance: 75.9 ml/min (by C-G formula based on Cr of 0.61).   Medical History: Past Medical History  Diagnosis Date  . ALLERGIC RHINITIS 01/30/2007  . CORONARY ARTERY DISEASE 01/30/2007  . DIABETES MELLITUS, TYPE II 01/30/2007  . HYPERTENSION 01/30/2007  . HYPERLIPIDEMIA 01/30/2007  . Bell's palsy 01/05/2010  . GOITER, MULTINODULAR 05/14/2010  . HEPATITIS 12/06/2007  . Thyromegaly     mild  . 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 Hyperlipidemia 04/18/2012    Intolerance to several statins   . Hypertensive heart disease without CHF   . HIstory of Bell's palsy     October 2012   . Obesity (BMI 30-39.9)   . Peripheral neuropathy   . Sleep apnea     Assessment: 24 YOF admitted with syncope to was to start IV heparin for positive cardiac enzymes but was stopped for emergent temporary pacemaker placement this morning. Now patient is to resume heparin with plans for cath tomorrow.   Heparin level < 0.10  Goal of Therapy:  Heparin level 0.3-0.7 units/ml Monitor platelets by anticoagulation protocol: Yes  Plan:  - Heparin 3000 unit IV bolus, then - Increase Heparin gtt to 1200 units/hr - Daily HL / CBC  Thank you,  Okey Regal,  PharmD [phone]  10/22/2012 7:37 PM

## 2012-10-22 NOTE — Progress Notes (Signed)
Pt and family members watching cardiac cath video

## 2012-10-22 NOTE — Consult Note (Signed)
[facility] Consult F/U Note [place] TEAM 1 - Stepdown/ICU TEAM   Laurie [name] ZOX:[id number] DOB: September 20, 1945 DOA: 10/21/2012 PCP: Rogelia Boga, MD  Brief narrative: 67 y.o. female has extensive PMH-Type 2 DM/severe insulin resistance, HTN, HL, CAD, S/P CABG in 2000, gets yearly nuclear stress test-last one in August of 2013 said to be negative, OSA-noncompliant with CPAP since 2010, peripheral neuropathy and morbid obesity. She was brought to the emergency room on 2/8 by her spouse due to 2 episodes of passing out. At approximately 10 AM on 2/8, she was sitting on her recliner when she felt dizzy and passed out. Her son who was sitting next to her noticed that she slumped her head backwards and started snoring. This episode lasted for less than a minute. Patient woke up and felt funny. She denied any preceding headache, chest pain or palpitations. Her CBG prior to this episode was 234 mg/dL. By this time her spouse came in and advised that they should go to the hospital to get checked out. She went to the toilet and was sitting on the toilet when her husband heard her snore followed by a loud noise. Patient indicated that she felt dizzy and then passed out. He found her lying face forward on the floor with bruise on the right forehead. This episode also lasted less than a minute. Neither episode was associated with seizure-like activity or stroke-like symptoms. She woke up and was coherent. They decided to come to the emergency room where vital signs were stable, head CT was negative, but POC troponin elevated at 1.6. EKG revealed old right bundle branch block and left anterior fascicular block.  Recomendations:  Syncope due to CHB As per primary service (Cardiology)  CAD s/p CABG 2000 As per primary service (Cardiology)  Uncontrolled DM2 CBG not at goal - adjust tx and cont to follow w/ you - noted remarkably large doses of insuiln as outpt  HTN BP reasonably controlled  at this time - follow trend w/o change today - was previously on BB whch has been d/c   HLD? Does not appear to be on chronic med tx - in setting of DM and prior CAD one could argue tx is indicated regardless of levels - will check lipid panel in AM - noted did suffer leg pain w/ Tricor  OSA  noncompliant w/ CPAP - educate on need for compliance  Obesity Educate on need for weight loss  Code Status: FULL  Antibiotics: none  DVT prophylaxis: Heparin infusion   HPI/Subjective: Pt is currently resting comfortably.  Denies dizziness, sob, cp, n/v, or abdom pain.    Objective: Blood pressure 125/53, pulse 88, temperature 99.2 F (37.3 C), temperature source Oral, resp. rate 20, height 5' 3.5" (1.613 m), weight 93.3 kg (205 lb 11 oz), SpO2 95.00%.  Intake/Output Summary (Last 24 hours) at 10/22/12 1527 Last data filed at 10/22/12 1400  Gross per 24 hour  Intake 977.33 ml  Output    850 ml  Net 127.33 ml    Exam: General: No acute respiratory distress Lungs: Clear to auscultation bilaterally without wheezes or crackles Cardiovascular: Regular rate and rhythm without murmur gallop or rub  Abdomen: obese, nontender, nondistended, soft, bowel sounds positive, no rebound, no ascites, no appreciable mass Extremities: No significant cyanosis, clubbing, or edema bilateral lower extremities  Data Reviewed: Basic Metabolic Panel:  Recent Labs Lab 10/21/12 1408  NA 136  K 3.7  CL 98  CO2 24  GLUCOSE 239*  BUN  15  CREATININE 0.61  CALCIUM 10.0   CBC:  Recent Labs Lab 10/21/12 1408 10/22/12 0500  WBC 9.1 8.4  HGB 14.4 13.1  HCT 41.8 38.1  MCV 90.9 90.9  PLT 222 205   Cardiac Enzymes:  Recent Labs Lab 10/21/12 1644 10/21/12 2344 10/22/12 0445  CKTOTAL 134  --  140  CKMB 5.8*  --  4.7*  TROPONINI 1.52* 1.70* 1.51*   CBG:  Recent Labs Lab 10/21/12 1935 10/21/12 2034 10/22/12 0804 10/22/12 1247  GLUCAP 114* 191* 227* 165*    Recent Results (from the  past 240 hour(s))  MRSA PCR SCREENING     Status: None   Collection Time    10/21/12 11:23 PM      Result Value Range Status   MRSA by PCR NEGATIVE  NEGATIVE Final   Comment:            The GeneXpert MRSA Assay (FDA     approved for NASAL specimens     only), is one component of a     comprehensive MRSA colonization     surveillance program. It is not     intended to diagnose MRSA     infection nor to guide or     monitor treatment for     MRSA infections.    Studies:  Recent x-ray studies have been reviewed in detail by the Attending Physician  Scheduled Meds:  Reviewed in detail by the Attending Physician   Lonia Blood, MD [facility] Office  [phone] Pager [phone]  On-Call/Text Page:      [email or web address]      password [email or web address]  If 7PM-7AM, please contact night-coverage [email or web address] Password [email or web address] 10/22/2012, 3:27 PM   LOS: 1 day

## 2012-10-22 NOTE — Progress Notes (Signed)
Pt transported to the cath lab for a temporal transvenous pacemaker for high grade AV block with greater than 14s pause.

## 2012-10-22 NOTE — Progress Notes (Signed)
Subjective:  Currently feels well, sleeping off and on, described presents yesterday while on telemetry feeling faint, hot flash feeling. No chest pain, no shortness of breath. She stated that she had some left groin discomfort. No evidence of hematoma. Temp wire placement was difficult. Both groins were attempted until right groin was finally successful.  Objective:  Vital Signs in the last 24 hours: Temp:  [98.3 F (36.8 C)-99.2 F (37.3 C)] 99.2 F (37.3 C) (02/09 0000) Pulse Rate:  [72-94] 72 (02/09 0700) Resp:  [16-24] 18 (02/09 0700) BP: (117-165)/(49-77) 118/68 mmHg (02/09 0700) SpO2:  [91 %-99 %] 96 % (02/09 0700) Weight:  [91.173 kg (201 lb)-93.3 kg (205 lb 11 oz)] 93.3 kg (205 lb 11 oz) (02/09 0000)  Intake/Output from previous day: 02/08 0701 - 02/09 0700 In: 250.3 [I.V.:250.3] Out: -    Physical Exam: General: Well developed, well nourished, in no acute distress. Head:  Normocephalic and atraumatic. Lungs: Clear to auscultation and percussion. Heart: Normal S1 and S2.  No murmur, rubs or gallops.  Abdomen: soft, non-tender, positive bowel sounds. Obese Extremities: No clubbing or cyanosis. No edema. Temporary wire in place right groin. I examined her left groin, no evidence of hematoma. Neurologic: Alert and oriented x 3.    Lab Results:  Recent Labs  10/21/12 1408 10/22/12 0500  WBC 9.1 8.4  HGB 14.4 13.1  PLT 222 205    Recent Labs  10/21/12 1408  NA 136  K 3.7  CL 98  CO2 24  GLUCOSE 239*  BUN 15  CREATININE 0.61    Recent Labs  10/21/12 2344 10/22/12 0445  TROPONINI 1.70* 1.51*   Imaging: Dg Chest 2 View  10/21/2012  *RADIOLOGY REPORT*  Clinical Data: Chest pain, weakness, dizziness  CHEST - 2 VIEW  Comparison: None.  Findings: No active infiltrate or effusion is seen.  The heart is mildly enlarged.  Median sternotomy sutures are noted from prior CABG.  No acute skeletal abnormality is seen.  IMPRESSION: Cardiomegaly.  No active lung  disease.   Original Report Authenticated By: Dwyane Dee, M.D.    Ct Head Wo Contrast  10/21/2012  *RADIOLOGY REPORT*  Clinical Data: Dizziness, fall  CT HEAD WITHOUT CONTRAST  Technique:  Contiguous axial images were obtained from the base of the skull through the vertex without contrast.  Comparison: All none  Findings: No acute intracranial hemorrhage.  No focal mass lesion. No CT evidence of acute infarction.   No midline shift or mass effect.  No hydrocephalus.  Basilar cisterns are patent.  Small hypodensity inferior to the left lentiform nucleus either represents a small lacunar infarction or a perivascular space. Mild periventricular white matter hypodensities.  No skull base fracture.  No fluid in the paranasal sinuses.  IMPRESSION:  1.  No evidence of intracranial trauma. 2.  No acute findings. 3.  Mild microvascular disease.   Original Report Authenticated By: Genevive Bi, M.D.    Personally viewed.   Telemetry: 12 sec non conducted P waves, high degree AV block. Personally viewed.   EKG:  Sinus rhythm, right bundle branch block, left anterior fascicular block at baseline.  Cardiac Studies:  Nuclear stress test in late 2012-low risk  Assessment/Plan:  Principal Problem:   Syncope Active Problems:   Hypertensive heart disease without CHF   CAD (coronary artery disease)   Hyperlipidemia   Obesity (BMI 30-39.9)   Sleep apnea   NSTEMI (non-ST elevated myocardial infarction)   DM (diabetes mellitus), type 2, uncontrolled  HTN (hypertension)   67 year old female with multiple syncopal episodes with newly discovered high degree AV block, positive troponin, diabetes, coronary artery disease status post bypass, sleep apnea, hypertension.  1. High degree AV block with associated syncope-appreciate Dr. Tresa Endo placing temporary pacemaker wire via right groin. She continued to have significant episodes of nonconducted P waves prior to temporary wire placement. Metoprolol has been  discontinued. With her troponin being elevated, cardiac catheterization will be performed prior to pacemaker implantation to make sure that she does not have any high-grade lesions contributing to ischemia. I will discuss with Dr. Donnie Aho. He does have a cardiac catheterization tomorrow morning at 7:30 AM. If assistance is necessary, I will be happy to help. I am in the hospital. We have discussed the risks and benefits of cardiac catheterization including stroke, heart attack, death, renal impairment, bleeding. I have discussed with Dr. Ladona Ridgel of electrophysiology who agrees with proceeding with cardiac catheterization. He will be seeing in consultation. Temporary pacer in place. Checking TSH. Echocardiogram pending.  2. Syncope-secondary to high-grade AV block.  3. Bifascicular block at baseline.  4. CAD/prior bypass-no subjective symptoms of chest pain. She notes that she did not have any chest discomfort prior to her bypass surgery.  5. Elevated troponin-likely secondary from periods of ventricular standstill. We will be performing cardiac catheterization tomorrow. Continue with IV heparin for now.  SKAINS, MARK 10/22/2012, 10:57 AM

## 2012-10-22 NOTE — Progress Notes (Signed)
VASCULAR LAB PRELIMINARY  PRELIMINARY  PRELIMINARY  PRELIMINARY  Carotid duplex completed.    Preliminary report:  Bilateral:  No evidence of hemodynamically significant internal carotid artery stenosis.   Vertebral artery flow is antegrade.     Nevena Rozenberg, RVS 10/22/2012, 12:08 PM

## 2012-10-22 NOTE — Progress Notes (Signed)
Notified by monitor tech that the pt is having multiple >7 second pauses on telemetry. Longest was 9 seconds. Pt. Reports feeling "hot flashes" and can precipitate when the next pause occurs. Vital signs were stable. BP 173/72. Notified Dr. Anne Fu. EKG obtained. (NSR with RBBB). New orders to d/c lopressor (which was not ordered) and to transfer to CCU. Will continue to monitor. Earnest Conroy RN

## 2012-10-22 NOTE — Progress Notes (Signed)
  Echocardiogram 2D Echocardiogram has been performed.  Laurie [name] FRANCES 10/22/2012, 5:13 PM

## 2012-10-22 NOTE — Progress Notes (Signed)
ANTICOAGULATION CONSULT NOTE - Initial Consult  Pharmacy Consult:  Heparin Indication:  ACS  Allergies  Allergen Reactions  . Tricor (Fenofibrate)     Leg pain    Patient Measurements: Height: 5' 3.5" (161.3 cm) Weight: 205 lb 11 oz (93.3 kg) IBW/kg (Calculated) : 53.55 Heparin Dosing Weight: 73 kg  Vital Signs: Temp: 99.2 F (37.3 C) (02/09 0000) Temp src: Oral (02/09 0000) BP: 118/68 mmHg (02/09 0700) Pulse Rate: 84 (02/09 1115)  Labs:  Recent Labs  10/21/12 1408 10/21/12 1644 10/21/12 2344 10/22/12 0445 10/22/12 0500  HGB 14.4  --   --   --  13.1  HCT 41.8  --   --   --  38.1  PLT 222  --   --   --  205  HEPARINUNFRC  --   --   --   --  <0.10*  CREATININE 0.61  --   --   --   --   CKTOTAL  --  134  --  140  --   CKMB  --  5.8*  --  4.7*  --   TROPONINI  --  1.52* 1.70* 1.51*  --     Estimated Creatinine Clearance: 75.9 ml/min (by C-G formula based on Cr of 0.61).   Medical History: Past Medical History  Diagnosis Date  . ALLERGIC RHINITIS 01/30/2007  . CORONARY ARTERY DISEASE 01/30/2007  . DIABETES MELLITUS, TYPE II 01/30/2007  . HYPERTENSION 01/30/2007  . HYPERLIPIDEMIA 01/30/2007  . Bell's palsy 01/05/2010  . GOITER, MULTINODULAR 05/14/2010  . HEPATITIS 12/06/2007  . Thyromegaly     mild  . 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 Hyperlipidemia 04/18/2012    Intolerance to several statins   . Hypertensive heart disease without CHF   . HIstory of Bell's palsy     October 2012   . Obesity (BMI 30-39.9)   . Peripheral neuropathy   . Sleep apnea     Assessment: 16 YOF admitted with syncope to was to start IV heparin for positive cardiac enzymes but was stopped for emergent temporary pacemaker placement this morning. Now patient is to resume heparin with plans for cath tomorrow. Patient is not on anticoagulation PTA.  CBC WNL. Safe to assume that previous bolus and infusion have cleared by now.    Goal of Therapy:  Heparin level 0.3-0.7 units/ml Monitor platelets by anticoagulation protocol: Yes  Plan:  - Heparin 3500 unit IV bolus, then - Heparin gtt at 900 units/hr - Check 6 hr HL - Daily HL / CBC  Thank you,  Brett Fairy, PharmD, BCPS 10/22/2012 11:58 AM

## 2012-10-22 NOTE — Progress Notes (Signed)
Utilization Review Completed.Calab Sachse T2/05/2013  

## 2012-10-23 ENCOUNTER — Encounter (HOSPITAL_COMMUNITY)
Admission: EM | Disposition: A | Discharge status: Home / Self Care | Payer: Self-pay | Source: Home / Self Care | Attending: Cardiology

## 2012-10-23 ENCOUNTER — Encounter (HOSPITAL_COMMUNITY): Payer: Self-pay | Admitting: Cardiology

## 2012-10-23 DIAGNOSIS — I441 Atrioventricular block, second degree: Secondary | ICD-10-CM | POA: Diagnosis present

## 2012-10-23 DIAGNOSIS — I442 Atrioventricular block, complete: Secondary | ICD-10-CM

## 2012-10-23 HISTORY — PX: LEFT HEART CATHETERIZATION WITH CORONARY ANGIOGRAM: SHX5451

## 2012-10-23 HISTORY — PX: PERMANENT PACEMAKER INSERTION: SHX5480

## 2012-10-23 LAB — GLUCOSE, CAPILLARY
Glucose-Capillary: 92 mg/dL (ref 70–99)
Glucose-Capillary: 114 mg/dL — ABNORMAL HIGH (ref 70–99)
Glucose-Capillary: 177 mg/dL — ABNORMAL HIGH (ref 70–99)

## 2012-10-23 LAB — LIPID PANEL
HDL: 37 mg/dL — ABNORMAL LOW (ref >39)
LDL Cholesterol: 111 mg/dL — ABNORMAL HIGH (ref 0–99)
Total CHOL/HDL Ratio: 5.1 RATIO

## 2012-10-23 LAB — CBC
HCT: 38.6 % (ref 36.0–46.0)
Hemoglobin: 13.1 g/dL (ref 12.0–15.0)
MCH: 31.1 pg (ref 26.0–34.0)
MCHC: 33.9 g/dL (ref 30.0–36.0)
MCV: 91.7 fL (ref 78.0–100.0)

## 2012-10-23 SURGERY — PERMANENT PACEMAKER INSERTION
Anesthesia: LOCAL

## 2012-10-23 SURGERY — LEFT HEART CATHETERIZATION WITH CORONARY ANGIOGRAM
Anesthesia: LOCAL

## 2012-10-23 MED ORDER — SODIUM CHLORIDE 0.9 % IV SOLN
250.0000 mL | INTRAVENOUS | Status: DC
Start: 2012-10-23 — End: 2012-10-23

## 2012-10-23 MED ORDER — SODIUM CHLORIDE 0.45 % IV SOLN: INTRAVENOUS | Status: DC

## 2012-10-23 MED ORDER — MIDAZOLAM HCL 2 MG/2ML IJ SOLN
INTRAMUSCULAR | Status: AC
  Filled 2012-10-23: qty 2

## 2012-10-23 MED ORDER — ACETAMINOPHEN 325 MG PO TABS
650.0000 mg | ORAL_TABLET | ORAL | Status: DC | PRN
  Filled 2012-10-23: qty 2

## 2012-10-23 MED ORDER — ACETAMINOPHEN 325 MG PO TABS
325.0000 mg | ORAL_TABLET | ORAL | Status: DC | PRN
Start: 2012-10-23 — End: 2012-10-26

## 2012-10-23 MED ORDER — SODIUM CHLORIDE 0.9 % IV SOLN: INTRAVENOUS | Status: AC

## 2012-10-23 MED ORDER — ONDANSETRON HCL 4 MG/2ML IJ SOLN: 4.0000 mg | Freq: Four times a day (QID) | INTRAMUSCULAR | Status: DC | PRN

## 2012-10-23 MED ORDER — SODIUM CHLORIDE 0.9 % IJ SOLN: 3.0000 mL | Freq: Two times a day (BID) | INTRAMUSCULAR | Status: DC

## 2012-10-23 MED ORDER — LIDOCAINE HCL (PF) 1 % IJ SOLN
INTRAMUSCULAR | Status: AC
  Filled 2012-10-23: qty 60

## 2012-10-23 MED ORDER — METOPROLOL SUCCINATE ER 50 MG PO TB24
50.0000 mg | ORAL_TABLET | Freq: Every day | ORAL | Status: DC
  Administered 2012-10-23 – 2012-10-26 (×4): 50 mg via ORAL
  Filled 2012-10-23 (×4): qty 1

## 2012-10-23 MED ORDER — MIDAZOLAM HCL 5 MG/5ML IJ SOLN
INTRAMUSCULAR | Status: AC
  Filled 2012-10-23: qty 5

## 2012-10-23 MED ORDER — CHLORHEXIDINE GLUCONATE 4 % EX LIQD: 60.0000 mL | Freq: Once | CUTANEOUS | Status: DC

## 2012-10-23 MED ORDER — ASPIRIN EC 81 MG PO TBEC
81.0000 mg | DELAYED_RELEASE_TABLET | Freq: Every day | ORAL | Status: DC
  Administered 2012-10-24 – 2012-10-26 (×3): 81 mg via ORAL
  Filled 2012-10-23 (×3): qty 1

## 2012-10-23 MED ORDER — HEPARIN (PORCINE) IN NACL 2-0.9 UNIT/ML-% IJ SOLN
INTRAMUSCULAR | Status: AC
  Filled 2012-10-23: qty 1000

## 2012-10-23 MED ORDER — SODIUM CHLORIDE 0.9 % IV SOLN
1.0000 mL/kg/h | INTRAVENOUS | Status: AC
  Administered 2012-10-23: 1 mL/kg/h via INTRAVENOUS

## 2012-10-23 MED ORDER — FENTANYL CITRATE 0.05 MG/ML IJ SOLN
INTRAMUSCULAR | Status: AC
Start: 2012-10-23 — End: 2012-10-23
  Filled 2012-10-23: qty 2

## 2012-10-23 MED ORDER — CEFAZOLIN SODIUM 1-5 GM-% IV SOLN
1.0000 g | Freq: Four times a day (QID) | INTRAVENOUS | Status: AC
  Administered 2012-10-23 – 2012-10-24 (×3): 1 g via INTRAVENOUS
  Filled 2012-10-23 (×3): qty 50

## 2012-10-23 MED ORDER — CEFAZOLIN SODIUM-DEXTROSE 2-3 GM-% IV SOLR
2.0000 g | INTRAVENOUS | Status: DC
  Filled 2012-10-23: qty 50

## 2012-10-23 MED ORDER — SODIUM CHLORIDE 0.9 % IJ SOLN: 3.0000 mL | INTRAMUSCULAR | Status: DC | PRN

## 2012-10-23 MED ORDER — SODIUM CHLORIDE 0.9 % IR SOLN
80.0000 mg | Status: DC
  Filled 2012-10-23: qty 2

## 2012-10-23 NOTE — Progress Notes (Signed)
ANTICOAGULATION CONSULT NOTE  Pharmacy Consult:  Heparin Indication:  ACS  Allergies  Allergen Reactions  . Tricor (Fenofibrate)     Leg pain    Labs:  Recent Labs  10/21/12 1408 10/21/12 1644 10/21/12 2344 10/22/12 0445 10/22/12 0500 10/22/12 1842 10/23/12 0510  HGB 14.4  --   --   --  13.1 12.7 13.1  HCT 41.8  --   --   --  38.1 36.7 38.6  PLT 222  --   --   --  205 194 197  LABPROT  --   --   --   --   --  11.8  --   INR  --   --   --   --   --  0.87  --   HEPARINUNFRC  --   --   --   --  <0.10* <0.10* 0.16*  CREATININE 0.61  --   --   --   --  0.61  --   CKTOTAL  --  134  --  140  --   --   --   CKMB  --  5.8*  --  4.7*  --   --   --   TROPONINI  --  1.52* 1.70* 1.51*  --   --   --     Estimated Creatinine Clearance: 75.8 ml/min (by C-G formula based on Cr of 0.61).  Assessment: 67 YO Female with ACS for heparin   Goal of Therapy:  Heparin level 0.3-0.7 units/ml Monitor platelets by anticoagulation protocol: Yes  Plan:  Increase Heparin 1400 units/hr F/U after cath  Geannie Risen, PharmD, BCPS  10/23/2012 6:29 AM

## 2012-10-23 NOTE — CV Procedure (Signed)
Preop DX:: complete heart block Post op DX:: same  Procedure  dual pacemaker implantation  After routine prep and drape, lidocaine was infiltrated in the prepectoral subclavicular region on the left side an incision was made and carried down to later the prepectoral fascia using electrocautery and sharp dissection a pocket was formed similarly. Hemostasis was obtained.  After this, we turned our attention to gaining accessm to the extrathoracic,left subclavian vein. This was accomplished without difficulty and without the aspiration of air or puncture of the artery. 2 separate venipunctures were accomplished; guidewires were placed and retained and sequentially 7 French sheath through which were  passed an.mdt 5076 ventricular lead serial [id number] and an  Medtronic 5076 atrial lead serial number  [id number] .  The ventricular lead was manipulated to the right ventricular apex with a bipolar R wave was 25 (paced), the pacing impedance was 1272, the threshold was 0.8 @ 0.5 msec  Current at threshold was   0.6  Ma and the current of injury was  moderate.  The right atrial lead was manipulated to the right atrial appendage remnant with a bipolar P-wave  1.5, the pacing impedance was 634, the threshold 1.9@ 0.5 msec   Current at threshold was 3.0  Ma and the current of injury was brisk.  The ventricular lead was marked with a tie prior to the insertion of the atrial lead. The leads were affixed to the prepectoral fascia and attached to a  Medtronic adapta L  pulse generator serial number [id number].  At this point the RV temporary lead was removed under fluoroscopic guidance  Hemostasis was obtained. The pocket was copiously irrigated with antibiotic containing saline solution. The leads and the pulse generator were placed in the pocket and affixed to the prepectoral fascia. The wound was then closed in 3 layers in the normal fashion. The wound was washed dried and a benzoin Steri-Strip  dressing was applied.  Needle  Count, sponge counts and instrument counts were correct at the end of the procedure .   The patient tolerated the procedure without apparent complication.  Gerlene Burdock.D.

## 2012-10-23 NOTE — H&P (View-Only) (Signed)
   Patient: Laurie [name] Date of Encounter: 10/23/2012, 8:21 AM Admit date: 10/21/2012     Subjective  Laurie [name] denies any complaints this AM. She has temp pacing wire in place.    Objective  Physical Exam: Vitals: BP 116/52  Pulse 75  Temp(Src) 98.8 F (37.1 C) (Oral)  Resp 15  Ht 5' 3.5" (1.613 m)  Wt 205 lb 0.4 oz (93 kg)  BMI 35.74 kg/m2  SpO2 97% General: Well developed, well appearing 67 year old female in no acute distress. Neck: Supple. JVD not elevated. Lungs: Clear bilaterally to auscultation without wheezes, rales, or rhonchi. Breathing is unlabored. Heart: RRR S1 S2 without murmur, rub or gallop.  Abdomen: Soft, non-distended. Extremities: No clubbing or cyanosis. No edema.  Distal pedal pulses are 2+ and equal bilaterally. Neuro: Alert and oriented X 3. Moves all extremities spontaneously. No focal deficits.  Intake/Output:  Intake/Output Summary (Last 24 hours) at 10/23/12 0821 Last data filed at 10/23/12 0700  Gross per 24 hour  Intake   1642 ml  Output   2175 ml  Net   -533 ml    Inpatient Medications:  . acidophilus  1 capsule Oral Daily  . aspirin  324 mg Oral Pre-Cath  . aspirin EC  325 mg Oral Daily  . chlorthalidone  25 mg Oral Daily  . diazepam  5 mg Oral On Call  . insulin aspart  0-20 Units Subcutaneous TID WC  . insulin aspart  0-5 Units Subcutaneous QHS  . insulin aspart  140 Units Subcutaneous Q breakfast  . insulin aspart  190 Units Subcutaneous Q1200  . insulin aspart  290 Units Subcutaneous QAC supper  . insulin glargine  60 Units Subcutaneous QHS  . lisinopril  40 mg Oral Daily  . loratadine  10 mg Oral Daily  . pantoprazole  40 mg Oral Daily  . potassium chloride SA  20 mEq Oral BID  . sodium chloride  3 mL Intravenous Q12H   . sodium chloride 50 mL/hr at 10/22/12 0500  . sodium chloride    . heparin 1,400 Units/hr (10/23/12 0700)    Labs:  Recent Labs  10/21/12 1408 10/22/12 1842  NA 136 136  K 3.7 4.1  CL 98  100  CO2 24 26  GLUCOSE 239* 226*  BUN 15 13  CREATININE 0.61 0.61  CALCIUM 10.0 9.6    Recent Labs  10/22/12 1842 10/23/12 0510  WBC 7.7 9.0  HGB 12.7 13.1  HCT 36.7 38.6  MCV 90.6 91.7  PLT 194 197    Recent Labs  10/21/12 1644 10/21/12 2344 10/22/12 0445  CKTOTAL 134  --  140  CKMB 5.8*  --  4.7*  TROPONINI 1.52* 1.70* 1.51*    Recent Labs  10/23/12 0510  CHOL 188  HDL 37*  LDLCALC 111*  TRIG 199*  CHOLHDL 5.1    Recent Labs  10/22/12 1240  TSH 1.429    Recent Labs  10/22/12 1842  INR 0.87    Radiology/Studies: Dg Chest 2 View  10/21/2012  *RADIOLOGY REPORT*  Clinical Data: Chest pain, weakness, dizziness  CHEST - 2 VIEW  Comparison: None.  Findings: No active infiltrate or effusion is seen.  The heart is mildly enlarged.  Median sternotomy sutures are noted from prior CABG.  No acute skeletal abnormality is seen.  IMPRESSION: Cardiomegaly.  No active lung disease.   Original Report Authenticated By: Paul Barry, M.D.    Ct Head Wo Contrast  10/21/2012  *  RADIOLOGY REPORT*  Clinical Data: Dizziness, fall  CT HEAD WITHOUT CONTRAST  Technique:  Contiguous axial images were obtained from the base of the skull through the vertex without contrast.  Comparison: All none  Findings: No acute intracranial hemorrhage.  No focal mass lesion. No CT evidence of acute infarction.   No midline shift or mass effect.  No hydrocephalus.  Basilar cisterns are patent.  Small hypodensity inferior to the left lentiform nucleus either represents a small lacunar infarction or a perivascular space. Mild periventricular white matter hypodensities.  No skull base fracture.  No fluid in the paranasal sinuses.  IMPRESSION:  1.  No evidence of intracranial trauma. 2.  No acute findings. 3.  Mild microvascular disease.   Original Report Authenticated By: Stewart Edmunds, M.D.     Echocardiogram: pending 12-lead ECG: on admission shows sinus rhythm at 73 bpm with RBBB; PR 146, QRS 140,  QT/QTc 442/486 Telemetry: sinus rhythm currently with RBBB; there are multiple episodes of high grade AV block/CHB with ventricular standstill, > 8 seconds in duration    Assessment and Plan  1. CHB with ventricular standstill and syncope On Toprol but will need to continue this medication in setting of known CAD Will need dual chamber PPM 2. Elevated troponin Acute ischemia vs. demand ischemia secondary to diminished coronary perfusion in setting of high grade AV block and underlying CAD For cardiac cath today  Dr. Viraat Vanpatten to see Signed, EDMISTEN, BROOKE PA-C  Seen  Cath reviewed, stable chronic coronary disease.  EF normal Intermittent complete heart block The benefits and risks were reviewed including but not limited to death,  perforation, infection, lead dislodgement and device malfunction.  The patient understands agrees and is willing to proceed.  

## 2012-10-23 NOTE — Progress Notes (Signed)
Patient: Laurie [name] Date of Encounter: 10/23/2012, 8:21 AM Admit date: 10/21/2012     Subjective  Laurie [name] denies any complaints this AM. She has temp pacing wire in place.    Objective  Physical Exam: Vitals: BP 116/52  Pulse 75  Temp(Src) 98.8 F (37.1 C) (Oral)  Resp 15  Ht 5' 3.5" (1.613 m)  Wt 205 lb 0.4 oz (93 kg)  BMI 35.74 kg/m2  SpO2 97% General: Well developed, well appearing 67 year old female in no acute distress. Neck: Supple. JVD not elevated. Lungs: Clear bilaterally to auscultation without wheezes, rales, or rhonchi. Breathing is unlabored. Heart: RRR S1 S2 without murmur, rub or gallop.  Abdomen: Soft, non-distended. Extremities: No clubbing or cyanosis. No edema.  Distal pedal pulses are 2+ and equal bilaterally. Neuro: Alert and oriented X 3. Moves all extremities spontaneously. No focal deficits.  Intake/Output:  Intake/Output Summary (Last 24 hours) at 10/23/12 1610 Last data filed at 10/23/12 0700  Gross per 24 hour  Intake   1642 ml  Output   2175 ml  Net   -533 ml    Inpatient Medications:  . acidophilus  1 capsule Oral Daily  . aspirin  324 mg Oral Pre-Cath  . aspirin EC  325 mg Oral Daily  . chlorthalidone  25 mg Oral Daily  . diazepam  5 mg Oral On Call  . insulin aspart  0-20 Units Subcutaneous TID WC  . insulin aspart  0-5 Units Subcutaneous QHS  . insulin aspart  140 Units Subcutaneous Q breakfast  . insulin aspart  190 Units Subcutaneous Q1200  . insulin aspart  290 Units Subcutaneous QAC supper  . insulin glargine  60 Units Subcutaneous QHS  . lisinopril  40 mg Oral Daily  . loratadine  10 mg Oral Daily  . pantoprazole  40 mg Oral Daily  . potassium chloride SA  20 mEq Oral BID  . sodium chloride  3 mL Intravenous Q12H   . sodium chloride 50 mL/hr at 10/22/12 0500  . sodium chloride    . heparin 1,400 Units/hr (10/23/12 0700)    Labs:  Recent Labs  10/21/12 1408 10/22/12 1842  NA 136 136  K 3.7 4.1  CL 98  100  CO2 24 26  GLUCOSE 239* 226*  BUN 15 13  CREATININE 0.61 0.61  CALCIUM 10.0 9.6    Recent Labs  10/22/12 1842 10/23/12 0510  WBC 7.7 9.0  HGB 12.7 13.1  HCT 36.7 38.6  MCV 90.6 91.7  PLT 194 197    Recent Labs  10/21/12 1644 10/21/12 2344 10/22/12 0445  CKTOTAL 134  --  140  CKMB 5.8*  --  4.7*  TROPONINI 1.52* 1.70* 1.51*    Recent Labs  10/23/12 0510  CHOL 188  HDL 37*  LDLCALC 111*  TRIG 199*  CHOLHDL 5.1    Recent Labs  10/22/12 1240  TSH 1.429    Recent Labs  10/22/12 1842  INR 0.87    Radiology/Studies: Dg Chest 2 View  10/21/2012  *RADIOLOGY REPORT*  Clinical Data: Chest pain, weakness, dizziness  CHEST - 2 VIEW  Comparison: None.  Findings: No active infiltrate or effusion is seen.  The heart is mildly enlarged.  Median sternotomy sutures are noted from prior CABG.  No acute skeletal abnormality is seen.  IMPRESSION: Cardiomegaly.  No active lung disease.   Original Report Authenticated By: Dwyane Dee, M.D.    Ct Head Wo Contrast  10/21/2012  *  RADIOLOGY REPORT*  Clinical Data: Dizziness, fall  CT HEAD WITHOUT CONTRAST  Technique:  Contiguous axial images were obtained from the base of the skull through the vertex without contrast.  Comparison: All none  Findings: No acute intracranial hemorrhage.  No focal mass lesion. No CT evidence of acute infarction.   No midline shift or mass effect.  No hydrocephalus.  Basilar cisterns are patent.  Small hypodensity inferior to the left lentiform nucleus either represents a small lacunar infarction or a perivascular space. Mild periventricular white matter hypodensities.  No skull base fracture.  No fluid in the paranasal sinuses.  IMPRESSION:  1.  No evidence of intracranial trauma. 2.  No acute findings. 3.  Mild microvascular disease.   Original Report Authenticated By: Genevive Bi, M.D.     Echocardiogram: pending 12-lead ECG: on admission shows sinus rhythm at 73 bpm with RBBB; PR 146, QRS 140,  QT/QTc 442/486 Telemetry: sinus rhythm currently with RBBB; there are multiple episodes of high grade AV block/CHB with ventricular standstill, > 8 seconds in duration    Assessment and Plan  1. CHB with ventricular standstill and syncope On Toprol but will need to continue this medication in setting of known CAD Will need dual chamber PPM 2. Elevated troponin Acute ischemia vs. demand ischemia secondary to diminished coronary perfusion in setting of high grade AV block and underlying CAD For cardiac cath today  Dr. Graciela Husbands to see Signed, Rick Duff PA-C  Seen  Cath reviewed, stable chronic coronary disease.  EF normal Intermittent complete heart block The benefits and risks were reviewed including but not limited to death,  perforation, infection, lead dislodgement and device malfunction.  The patient understands agrees and is willing to proceed.

## 2012-10-23 NOTE — Consult Note (Signed)
[facility] Consult F/U Note [place] TEAM 1 - Stepdown/ICU TEAM   KYNZLIE HILLEARY AVW:[id number] DOB: [birth year] DOA: 10/21/2012 PCP: Rogelia Boga, MD  Brief narrative: 67 y.o. female has extensive PMH-Type 2 DM/severe insulin resistance, HTN, HL, CAD, S/P CABG in 2000, gets yearly nuclear stress test-last one in August of 2013 said to be negative, OSA-noncompliant with CPAP since 2010, peripheral neuropathy and morbid obesity. She was brought to the emergency room on 2/8 by her spouse due to 2 episodes of passing out. At approximately 10 AM on 2/8, she was sitting on her recliner when she felt dizzy and passed out. Her son who was sitting next to her noticed that she slumped her head backwards and started snoring. This episode lasted for less than a minute. Patient woke up and felt funny. She denied any preceding headache, chest pain or palpitations. Her CBG prior to this episode was 234 mg/dL. By this time her spouse came in and advised that they should go to the hospital to get checked out. She went to the toilet and was sitting on the toilet when her husband heard her snore followed by a loud noise. Patient indicated that she felt dizzy and then passed out. He found her lying face forward on the floor with bruise on the right forehead. This episode also lasted less than a minute. Neither episode was associated with seizure-like activity or stroke-like symptoms. She woke up and was coherent. They decided to come to the emergency room where vital signs were stable, head CT was negative, but POC troponin elevated at 1.6. EKG revealed old right bundle branch block and left anterior fascicular block.  Recomendations:  Syncope due to CHB As per primary service (Cardiology)  CAD s/p CABG 2000 As per primary service (Cardiology)  Uncontrolled DM2 CBG much improved - no change in tx plan today - noted remarkably large doses of insuiln as outpt - will cont to follow w/ you   HTN BP  reasonably controlled at this time - follow trend w/o change today - was previously on BB   HLD Lipid panel confirms need for tx - unfortunately, pt reports allergy/intolerance to statins/tricor - will defer to Cardiology as to other options  OSA  noncompliant w/ CPAP - educate on need for compliance  Obesity Educate on need for weight loss  Code Status: FULL  Antibiotics: none  DVT prophylaxis: Heparin infusion   HPI/Subjective: Pt has been unavailable today (cardiac cath, EP lab).  Will f/u in AM.  Objective: Blood pressure 119/51, pulse 59, temperature 98.8 F (37.1 C), temperature source Oral, resp. rate 15, height 5' 3.5" (1.613 m), weight 93 kg (205 lb 0.4 oz), SpO2 98.00%.  Intake/Output Summary (Last 24 hours) at 10/23/12 1340 Last data filed at 10/23/12 0800  Gross per 24 hour  Intake   1184 ml  Output   1725 ml  Net   -541 ml    Exam: Pt has not been avaialble for exam today (cardiac cath and EP lab)  Data Reviewed: Basic Metabolic Panel:  Recent Labs Lab 10/21/12 1408 10/22/12 1842  NA 136 136  K 3.7 4.1  CL 98 100  CO2 24 26  GLUCOSE 239* 226*  BUN 15 13  CREATININE 0.61 0.61  CALCIUM 10.0 9.6   CBC:  Recent Labs Lab 10/21/12 1408 10/22/12 0500 10/22/12 1842 10/23/12 0510  WBC 9.1 8.4 7.7 9.0  HGB 14.4 13.1 12.7 13.1  HCT 41.8 38.1 36.7 38.6  MCV 90.9 90.9  90.6 91.7  PLT 222 205 194 197   Cardiac Enzymes:  Recent Labs Lab 10/21/12 1644 10/21/12 2344 10/22/12 0445  CKTOTAL 134  --  140  CKMB 5.8*  --  4.7*  TROPONINI 1.52* 1.70* 1.51*   CBG:  Recent Labs Lab 10/22/12 1247 10/22/12 1737 10/23/12 0052 10/23/12 0737 10/23/12 1130  GLUCAP 165* 158* 92 114* 170*    Recent Results (from the past 240 hour(s))  MRSA PCR SCREENING     Status: None   Collection Time    10/21/12 11:23 PM      Result Value Range Status   MRSA by PCR NEGATIVE  NEGATIVE Final   Comment:            The GeneXpert MRSA Assay (FDA     approved  for NASAL specimens     only), is one component of a     comprehensive MRSA colonization     surveillance program. It is not     intended to diagnose MRSA     infection nor to guide or     monitor treatment for     MRSA infections.    Studies:  Recent x-ray studies have been reviewed in detail by the Attending Physician  Scheduled Meds:  Reviewed in detail by the Attending Physician   Lonia Blood, MD [facility] Office  [phone] Pager [phone]  On-Call/Text Page:      [email or web address]      password [email or web address]  If 7PM-7AM, please contact night-coverage [email or web address] Password [email or web address] 10/23/2012, 1:40 PM   LOS: 2 days

## 2012-10-23 NOTE — Progress Notes (Signed)
Patient stable post cath and pacemaker placement.  Her grafts were all patent.  Currently resting well.  Plan to move to telemetry and hopefully d/c tomorrow.  [name] MD [facility]

## 2012-10-23 NOTE — Progress Notes (Signed)
Subjective:  Feeling OK this am.  Not SOB.  Denies chest pain, but felt a little raw.  Objective:  Vital Signs in the last 24 hours: BP 116/52  Pulse 75  Temp(Src) 98.8 F (37.1 C) (Oral)  Resp 15  Ht 5' 3.5" (1.613 m)  Wt 93 kg (205 lb 0.4 oz)  BMI 35.74 kg/m2  SpO2 97%  Physical Exam: Pleasant obese WF in NAD Lungs:  Clear Cardiac:  Regular rhythm, normal S1 and S2, no S3 Extremities:  No edema present  Intake/Output from previous day: 02/09 0701 - 02/10 0700 In: 1692 [P.O.:420; I.V.:1272] Out: 2575 [Urine:2575]  Weight Filed Weights   10/21/12 1903 10/22/12 0000 10/23/12 0500  Weight: 91.173 kg (201 lb) 93.3 kg (205 lb 11 oz) 93 kg (205 lb 0.4 oz)    Lab Results: Basic Metabolic Panel:  Recent Labs  14/78/29 1408 10/22/12 1842  NA 136 136  K 3.7 4.1  CL 98 100  CO2 24 26  GLUCOSE 239* 226*  BUN 15 13  CREATININE 0.61 0.61   CBC:  Recent Labs  10/22/12 1842 10/23/12 0510  WBC 7.7 9.0  HGB 12.7 13.1  HCT 36.7 38.6  MCV 90.6 91.7  PLT 194 197   Cardiac Enzymes:  Recent Labs  10/21/12 1644 10/21/12 2344 10/22/12 0445  CKTOTAL 134  --  140  CKMB 5.8*  --  4.7*  TROPONINI 1.52* 1.70* 1.51*    Assessment/Plan:  Plan cath to look at bypasses today.  Will attempt left groin but if can't access may need for Dr. Anne Fu to go with left radial.Cardiac catheterization was discussed with the patient fully including risks of myocardial infarction, death, stroke, bleeding, arrhythmia, dye allergy, renal insufficiency or bleeding.  The patient understands and is willing to proceed.    [name]  MD [facility] Cardiology  10/23/2012, 7:37 AM

## 2012-10-23 NOTE — CV Procedure (Signed)
CARDIAC CATHETERIZATION REPORT   Laurie [name]  DOB: May 27, 1946  MRN: [id number]  Today's date: 10/23/2012     PROCEDURE:  Left heart catheterization with selective coronary angiography, bypass graft angiograms, and left ventriculogram.  INDICATIONS:  Syncope, high degree AV block, non-STEMI The risks, benefits, and details of the procedure were explained to the patient.  The patient verbalized understanding and wanted to proceed.  Informed written consent was obtained.  PROCEDURE TECHNIQUE:  After Xylocaine anesthesia a 62F sheath was placed in the left femoral artery with some difficulty. Patient had a previous pacemaker that had been implanted into the right femoral vein. Left coronary angiography was done using a Judkins L4 guide catheter.  Right coronary angiography was done using a Judkins R4 guide catheter. Bypass angiograms were made with the JR4 catheter. The LIMA was not able to be subselectively injected due to tortuosity of the subclavian artery but the vessel was patent and we felt that this was adequate enough. Left ventriculography was done using a pigtail catheter. On entering the left ventricle with a pigtail catheter the patient went into complete heart block and started pacing. The patient tolerated the procedure well and was returned to the holding area for sheath removal.   CONTRAST:  75 cc.  COMPLICATIONS:  None.    HEMODYNAMICS:  Aortic postcontrast 127/56 LV postcontrast 121/118.  There was no gradient between the left ventricle and aorta.    ANGIOGRAPHIC DATA:    CORONARY ARTERIES:   Arise and distribute normally.  Right dominant. Significant coronary calcification is noted.  Left main coronary artery:  70% distal left main stenosis  Left anterior descending: Occluded in the midportion  Circumflex coronary artery: Continuation branch is patent. The first and second marginals filled by patent grafts   Right coronary  artery: Diffusely diseased proximally. Proximal 80% stenosis. Severe 99% stenosis following an acute marginal artery and the vessel is occluded distally after a twin posterior descending artery  Saphenous vein graft to the diagonal branch is small but widely patent  Saphenous vein graft to the obtuse marginal artery is patent. This appears to get them a second marginal artery and then fills the first marginal artery retrograde  Saphenous vein graft to the right artery is widely patent which fills the posterior descending artery which appears diffusely diseased  Left internal mammary graft to the LAD is patent but nonselectively injected.  LEFT VENTRICULOGRAM:  Performed in the 30 RAO projection.  The aortic and mitral valves are normal. The left ventricle is normal in size with normal wall motion. Estimated ejection fraction is 60%.  IMPRESSIONS:  1. Severe native three-vessel coronary artery disease with occlusion of all 3 native coronary arteries 2. Patent saphenous vein grafts to the diagonal, OM, right coronary artery and patent internal mammary graft to the LAD 3. Normal left ventricle.[name]  RECOMMENDATION: Pacemaker placement later today.   [name] MD [facility] Cardiology

## 2012-10-23 NOTE — Progress Notes (Signed)
Inpatient Diabetes Program Recommendations  AACE/ADA: New Consensus Statement on Inpatient Glycemic Control (2013)  Target Ranges:  Prepandial:   less than 140 mg/dL      Peak postprandial:   less than 180 mg/dL (1-2 hours)      Critically ill patients:  140 - 180 mg/dL   Diabetes Coordinator is following this patient.  No recommendations at this time.  Patient is on large doses of Humalog as outpatient.   Thank you  Piedad Climes BSN, RN,CDE Inpatient Diabetes Coordinator [phone] (team pager)

## 2012-10-23 NOTE — Care Management Note (Signed)
    Page 1 of 1   10/23/2012     9:21:38 AM   CARE MANAGEMENT NOTE 10/23/2012  Patient:  Laurie [name], Laurie [name]   Account Number:  [id number]  Date Initiated:  10/23/2012  Documentation initiated by:  Junius Creamer  Subjective/Objective Assessment:   adm w syncope     Action/Plan:   lives w fam, pcp dr Lesia Hausen   Anticipated DC Date:     Anticipated DC Plan:        DC Planning Services  CM consult      Choice offered to / List presented to:             Status of service:   Medicare Important Message given?   (If response is "NO", the following Medicare IM given date fields will be blank) Date Medicare IM given:   Date Additional Medicare IM given:    Discharge Disposition:    Per UR Regulation:  Reviewed for med. necessity/level of care/duration of stay  If discussed at Long Length of Stay Meetings, dates discussed:    Comments:  2/10 0919 debbie Yanessa Hocevar rn,bsn

## 2012-10-23 NOTE — Interval H&P Note (Signed)
History and Physical Interval Note:  10/23/2012 12:52 PM  Laurie [name]  has presented today for surgery, with the diagnosis of CHB  The various methods of treatment have been discussed with the patient and family. After consideration of risks, benefits and other options for treatment, the patient has consented to  Procedure(s): PERMANENT PACEMAKER INSERTION (N/A) as a surgical intervention .  The patient's history has been reviewed, patient examined, no change in status, stable for surgery.  I have reviewed the patient's chart and labs.  Questions were answered to the patient's satisfaction.     Sherryl Manges

## 2012-10-24 ENCOUNTER — Inpatient Hospital Stay (HOSPITAL_COMMUNITY): Payer: Medicare HMO

## 2012-10-24 DIAGNOSIS — I2510 Atherosclerotic heart disease of native coronary artery without angina pectoris: Secondary | ICD-10-CM

## 2012-10-24 DIAGNOSIS — Z950 Presence of cardiac pacemaker: Secondary | ICD-10-CM

## 2012-10-24 DIAGNOSIS — E1165 Type 2 diabetes mellitus with hyperglycemia: Secondary | ICD-10-CM

## 2012-10-24 DIAGNOSIS — E118 Type 2 diabetes mellitus with unspecified complications: Secondary | ICD-10-CM

## 2012-10-24 DIAGNOSIS — I441 Atrioventricular block, second degree: Secondary | ICD-10-CM

## 2012-10-24 LAB — BASIC METABOLIC PANEL
Calcium: 9.4 mg/dL (ref 8.4–10.5)
GFR calc Af Amer: >90 mL/min (ref >90)
GFR calc non Af Amer: >90 mL/min (ref >90)
Sodium: 139 mEq/L (ref 135–145)

## 2012-10-24 LAB — CBC
MCH: 31.2 pg (ref 26.0–34.0)
MCHC: 33.8 g/dL (ref 30.0–36.0)
Platelets: 209 10*3/uL (ref 150–400)
RBC: 4.26 MIL/uL (ref 3.87–5.11)
RDW: 12.8 % (ref 11.5–15.5)

## 2012-10-24 LAB — GLUCOSE, CAPILLARY
Glucose-Capillary: 137 mg/dL — ABNORMAL HIGH (ref 70–99)
Glucose-Capillary: 152 mg/dL — ABNORMAL HIGH (ref 70–99)
Glucose-Capillary: 179 mg/dL — ABNORMAL HIGH (ref 70–99)

## 2012-10-24 MED ORDER — ENOXAPARIN SODIUM 40 MG/0.4ML ~~LOC~~ SOLN
40.0000 mg | SUBCUTANEOUS | Status: DC
  Administered 2012-10-24 – 2012-10-26 (×2): 40 mg via SUBCUTANEOUS
  Filled 2012-10-24 (×4): qty 0.4

## 2012-10-24 MED ORDER — ASPIRIN 81 MG PO TBEC: 81.0000 mg | DELAYED_RELEASE_TABLET | Freq: Every day | ORAL | Status: DC

## 2012-10-24 MED ORDER — OMEPRAZOLE 20 MG PO CPDR: 20.0000 mg | DELAYED_RELEASE_CAPSULE | Freq: Every day | ORAL | Status: DC

## 2012-10-24 MED FILL — Atropine Sulfate Inj 0.1 MG/ML: INTRAMUSCULAR | Qty: 10 | Status: AC

## 2012-10-24 NOTE — Progress Notes (Signed)
Orthopedic Tech Progress Note Patient Details:  Laurie [name] 1946/07/24 [id number] Visited patient in room to check for arm sling. Patient had arm sling but had sling applied incorrectly. Adjusted sling immobilizer for patient and explained proper application and use.  Patient ID: Laurie [name], female   DOB: September 21, 1945, 66 y.o.   MRN: [id number]   [name] 10/24/2012, 1:07 PM

## 2012-10-24 NOTE — Progress Notes (Signed)
Patient in junctional and accelerated junctional rhythm with failure to capture atrial and ventricular pacing impulses at 20:25. This has been intermittent since at least 18:30, per telemetry strips. Asymptomatic with stable vital signs. BP was 109/61, HR 60. Briefly discussed findings with Dr. Mayford Knife, on-call for Dr. Donnie Aho. No immediate intervention needed.  Continuing to closely monitor.

## 2012-10-24 NOTE — Progress Notes (Signed)
Subjective:  Feeling good this am, pacing fine.  Not SOB.   Objective:  Vital Signs in the last 24 hours: BP 106/57  Pulse 83  Temp(Src) 97.9 F (36.6 C) (Oral)  Resp 18  Ht 5' 3.5" (1.613 m)  Wt 93 kg (205 lb 0.4 oz)  BMI 35.74 kg/m2  SpO2 93%  Physical Exam: Pleasant obese WF in NAD Lungs:  Clear, pacer site fine Cardiac:  Regular rhythm, normal S1 and S2, no S3 Extremities:  No edema present cath site with ecchymoses  Intake/Output from previous day: 02/10 0701 - 02/11 0700 In: 1961.8 [P.O.:720; I.V.:1091.8; IV Piggyback:150] Out: 2650 [Urine:2650]  Weight Filed Weights   10/21/12 1903 10/22/12 0000 10/23/12 0500  Weight: 91.173 kg (201 lb) 93.3 kg (205 lb 11 oz) 93 kg (205 lb 0.4 oz)    Lab Results: Basic Metabolic Panel:  Recent Labs  14/78/29 1842 10/24/12 0515  NA 136 139  K 4.1 4.1  CL 100 103  CO2 26 28  GLUCOSE 226* 117*  BUN 13 13  CREATININE 0.61 0.66   CBC:  Recent Labs  10/23/12 0510 10/24/12 0515  WBC 9.0 8.3  HGB 13.1 13.3  HCT 38.6 39.3  MCV 91.7 92.3  PLT 197 209   Cardiac Enzymes:  Recent Labs  10/21/12 1644 10/21/12 2344 10/22/12 0445  CKTOTAL 134  --  140  CKMB 5.8*  --  4.7*  TROPONINI 1.52* 1.70* 1.51*    Assessment/Plan:  1. S/p pacer, doing well 2. CAD with patent bypasses 3. NonSTEMI due to demand ischemia 4. Diabetes with comps with insulin resistance  Rec:  Dr. Graciela Husbands would like to keep her another 24 hours.  Will move to floor and ambulate.Hopefully d/c in am.   W. Ashley Royalty  MD [facility] Cardiology  10/24/2012, 8:45 AM

## 2012-10-24 NOTE — Consult Note (Addendum)
[facility] Consult F/U Note [place] TEAM 1 - Stepdown/ICU TEAM   Laurie [name] FAO:[id number] DOB: [birth year] DOA: 10/21/2012 PCP: Rogelia Boga, MD  Brief narrative: 68 y.o. female has extensive PMH-Type 2 DM/severe insulin resistance, HTN, HL, CAD, S/P CABG in 2000, gets yearly nuclear stress test-last one in August of 2013 said to be negative, OSA-noncompliant with CPAP since 2010, peripheral neuropathy and morbid obesity. She was brought to the emergency room on 2/8 by her spouse due to 2 episodes of passing out. At approximately 10 AM on 2/8, she was sitting on her recliner when she felt dizzy and passed out. Her son who was sitting next to her noticed that she slumped her head backwards and started snoring. This episode lasted for less than a minute. Patient woke up and felt funny. She denied any preceding headache, chest pain or palpitations. Her CBG prior to this episode was 234 mg/dL. By this time her spouse came in and advised that they should go to the hospital to get checked out. She went to the toilet and was sitting on the toilet when her husband heard her snore followed by a loud noise. Patient indicated that she felt dizzy and then passed out. He found her lying face forward on the floor with bruise on the right forehead. This episode also lasted less than a minute. Neither episode was associated with seizure-like activity or stroke-like symptoms. She woke up and was coherent. They decided to come to the emergency room where vital signs were stable, head CT was negative, but POC troponin elevated at 1.6. EKG revealed old right bundle branch block and left anterior fascicular block.  Recomendations:  Syncope due to CHB Pacer placed yesterday- to be followed for another 24 hrs per cardiology  CAD s/p CABG 2000 As per primary service (Cardiology)  Uncontrolled DM2 Episode of mild hypoglycemia noted- pt states to me that she did not have any symptoms - no change in  tx plan today - noted remarkably large doses of insuiln as outpt - will cont to follow w/ you   HTN BP reasonably controlled at this time - follow trend w/o change today - was previously on BB   HLD Lipid panel confirms need for tx - unfortunately, pt reports allergy/intolerance to statins/tricor - will defer to Cardiology as to other options  OSA  noncompliant w/ CPAP - educate on need for compliance  Obesity Educate on need for weight loss  Code Status: FULL  Antibiotics: none  DVT prophylaxis: Heparin infusion   HPI/Subjective: Had an episode of dyspnea this afternoon. Per RN pt was moving about the room when it occurred. Vitals were stable. Per Pt, episode resolved in 30 min.   Objective: Blood pressure 117/68, pulse 60, temperature 98.8 F (37.1 C), temperature source Oral, resp. rate 20, height 5' 3.5" (1.613 m), weight 93 kg (205 lb 0.4 oz), SpO2 97.00%.  Intake/Output Summary (Last 24 hours) at 10/24/12 1741 Last data filed at 10/24/12 0900  Gross per 24 hour  Intake 1584.75 ml  Output   1750 ml  Net -165.25 ml    Exam: Pt has not been avaialble for exam today (cardiac cath and EP lab)  Data Reviewed: Basic Metabolic Panel:  Recent Labs Lab 10/21/12 1408 10/22/12 1842 10/24/12 0515  NA 136 136 139  K 3.7 4.1 4.1  CL 98 100 103  CO2 24 26 28   GLUCOSE 239* 226* 117*  BUN 15 13 13   CREATININE 0.61 0.61 0.66  CALCIUM 10.0 9.6 9.4   CBC:  Recent Labs Lab 10/21/12 1408 10/22/12 0500 10/22/12 1842 10/23/12 0510 10/24/12 0515  WBC 9.1 8.4 7.7 9.0 8.3  HGB 14.4 13.1 12.7 13.1 13.3  HCT 41.8 38.1 36.7 38.6 39.3  MCV 90.9 90.9 90.6 91.7 92.3  PLT 222 205 194 197 209   Cardiac Enzymes:  Recent Labs Lab 10/21/12 1644 10/21/12 2344 10/22/12 0445  CKTOTAL 134  --  140  CKMB 5.8*  --  4.7*  TROPONINI 1.52* 1.70* 1.51*   CBG:  Recent Labs Lab 10/24/12 0424 10/24/12 0519 10/24/12 0743 10/24/12 1119 10/24/12 1635  GLUCAP 63* 97 183*  175* 137*    Recent Results (from the past 240 hour(s))  MRSA PCR SCREENING     Status: None   Collection Time    10/21/12 11:23 PM      Result Value Range Status   MRSA by PCR NEGATIVE  NEGATIVE Final   Comment:            The GeneXpert MRSA Assay (FDA     approved for NASAL specimens     only), is one component of a     comprehensive MRSA colonization     surveillance program. It is not     intended to diagnose MRSA     infection nor to guide or     monitor treatment for     MRSA infections.    Studies:  Recent x-ray studies have been reviewed in detail by the Attending Physician  Scheduled Meds:  Reviewed in detail by the Attending Physician   Calvert Cantor, MD [phone]  If 7PM-7AM, please contact night-coverage [email or web address] Password [facility] 10/24/2012, 5:41 PM   LOS: 3 days

## 2012-10-24 NOTE — Progress Notes (Signed)
   ELECTROPHYSIOLOGY ROUNDING NOTE    Patient Name: Laurie [name] Date of Encounter: 10-24-2012    SUBJECTIVE:Patient feels well.  No chest pain or shortness of breath.  Moderate incisional soreness.  S/p LHC and dual chamber pacemaker implant 10-23-2012.  TELEMETRY: Reviewed telemetry pt in sinus rhythm with ventricular pacing Filed Vitals:   10/24/12 0200 10/24/12 0300 10/24/12 0400 10/24/12 0500  BP: 116/50 102/51 113/53 121/61  Pulse: 80 75 76 74  Temp:   97.9 F (36.6 C)   TempSrc:      Resp: 21 17 18 20   Height:      Weight:      SpO2: 96% 96% 97% 99%    Intake/Output Summary (Last 24 hours) at 10/24/12 0601 Last data filed at 10/24/12 0421  Gross per 24 hour  Intake 2011.75 ml  Output   1900 ml  Net 111.75 ml    LABS: Basic Metabolic Panel:  Recent Labs  16/10/96 1408 10/22/12 1842  NA 136 136  K 3.7 4.1  CL 98 100  CO2 24 26  GLUCOSE 239* 226*  BUN 15 13  CREATININE 0.61 0.61  CALCIUM 10.0 9.6   CBC:  Recent Labs  10/23/12 0510 10/24/12 0515  WBC 9.0 8.3  HGB 13.1 13.3  HCT 38.6 39.3  MCV 91.7 92.3  PLT 197 209   Cardiac Enzymes:  Recent Labs  10/21/12 1644 10/21/12 2344 10/22/12 0445  CKTOTAL 134  --  140  CKMB 5.8*  --  4.7*  TROPONINI 1.52* 1.70* 1.51*   Fasting Lipid Panel:  Recent Labs  10/23/12 0510  CHOL 188  HDL 37*  LDLCALC 111*  TRIG 199*  CHOLHDL 5.1   Thyroid Function Tests:  Recent Labs  10/22/12 1240  TSH 1.429   Radiology/Studies:  Final result pending, leads in stable position.  PHYSICAL EXAM Left chest without hematoma or ecchymosis.  Tegaderm left in place until tomorrow.   DEVICE INTERROGATION: Device interrogation pending.   Wound care, arm mobility, restrictions will be reviewed with patient prior to discharge. Well developed and nourished in no acute distress HENT normal Neck supple with JVP-flat Clear Regular rate and rhythm, no murmurs or gallops Abd-soft with active BS No  Clubbing cyanosis edema Skin-warm and dry A & Oriented  Grossly normal sensory and motor function   Ambulate  Discharge per dr Donnie Aho Wound check LHC 10-14 days

## 2012-10-24 NOTE — Progress Notes (Signed)
Inpatient Diabetes Program Recommendations  AACE/ADA: New Consensus Statement on Inpatient Glycemic Control (2013)  Target Ranges:  Prepandial:   less than 140 mg/dL      Peak postprandial:   less than 180 mg/dL (1-2 hours)      Critically ill patients:  140 - 180 mg/dL   HYPOglycemia at 1610 CBG=63   Inpatient Diabetes Program Recommendations Insulin - Meal Coverage: Parameter for meal coverage insulin applied to current order per Dr. Donnie Aho  Thank you  Piedad Climes BSN, RN,CDE Inpatient Diabetes Coordinator [phone] (team pager)

## 2012-10-24 NOTE — Progress Notes (Signed)
Pt c/o feeling like "its hard to catch a breath and feeling funny." EKG obtained and showed AV pacing in the 60s. VSS and documented in doc flowsheets. O2 sats 94% on room air. 2L o2 applied for comfort reasons. CBG 138. Pt denies any CP or diaphoresis. DR. Donnie Aho called and made aware. No new orders at this time. Levonne Spiller, RN

## 2012-10-25 ENCOUNTER — Encounter (HOSPITAL_COMMUNITY)
Admission: EM | Disposition: A | Discharge status: Home / Self Care | Payer: Self-pay | Source: Home / Self Care | Attending: Cardiology

## 2012-10-25 ENCOUNTER — Inpatient Hospital Stay (HOSPITAL_COMMUNITY): Payer: Medicare HMO

## 2012-10-25 DIAGNOSIS — T82190A Other mechanical complication of cardiac electrode, initial encounter: Secondary | ICD-10-CM

## 2012-10-25 HISTORY — PX: LEAD REVISION: SHX5945

## 2012-10-25 LAB — GLUCOSE, CAPILLARY

## 2012-10-25 SURGERY — LEAD REVISION
Anesthesia: LOCAL

## 2012-10-25 MED ORDER — CHLORHEXIDINE GLUCONATE 4 % EX LIQD: 60.0000 mL | Freq: Once | CUTANEOUS | Status: AC

## 2012-10-25 MED ORDER — CEFAZOLIN SODIUM-DEXTROSE 2-3 GM-% IV SOLR
2.0000 g | INTRAVENOUS | Status: DC
  Filled 2012-10-25: qty 50

## 2012-10-25 MED ORDER — FENTANYL CITRATE 0.05 MG/ML IJ SOLN
INTRAMUSCULAR | Status: AC
  Filled 2012-10-25: qty 2

## 2012-10-25 MED ORDER — SODIUM CHLORIDE 0.45 % IV SOLN
INTRAVENOUS | Status: DC
  Administered 2012-10-25: 11:00:00 via INTRAVENOUS

## 2012-10-25 MED ORDER — ACETAMINOPHEN 325 MG PO TABS: 325.0000 mg | ORAL_TABLET | ORAL | Status: DC | PRN

## 2012-10-25 MED ORDER — SODIUM CHLORIDE 0.9 % IR SOLN
80.0000 mg | Status: DC
  Filled 2012-10-25: qty 2

## 2012-10-25 MED ORDER — SODIUM CHLORIDE 0.9 % IJ SOLN: 3.0000 mL | INTRAMUSCULAR | Status: DC | PRN

## 2012-10-25 MED ORDER — SODIUM CHLORIDE 0.9 % IV SOLN: 250.0000 mL | INTRAVENOUS | Status: DC

## 2012-10-25 MED ORDER — ONDANSETRON HCL 4 MG/2ML IJ SOLN: 4.0000 mg | Freq: Four times a day (QID) | INTRAMUSCULAR | Status: DC | PRN

## 2012-10-25 MED ORDER — CHLORHEXIDINE GLUCONATE 4 % EX LIQD
60.0000 mL | Freq: Once | CUTANEOUS | Status: AC
  Administered 2012-10-25: 4 via TOPICAL

## 2012-10-25 MED ORDER — HEPARIN (PORCINE) IN NACL 2-0.9 UNIT/ML-% IJ SOLN
INTRAMUSCULAR | Status: AC
  Filled 2012-10-25: qty 500

## 2012-10-25 MED ORDER — SODIUM CHLORIDE 0.9 % IJ SOLN: 3.0000 mL | Freq: Two times a day (BID) | INTRAMUSCULAR | Status: DC

## 2012-10-25 MED ORDER — LIDOCAINE HCL (PF) 1 % IJ SOLN
INTRAMUSCULAR | Status: AC
  Filled 2012-10-25: qty 60

## 2012-10-25 MED ORDER — CEFAZOLIN SODIUM-DEXTROSE 2-3 GM-% IV SOLR
2.0000 g | Freq: Four times a day (QID) | INTRAVENOUS | Status: AC
  Administered 2012-10-25 – 2012-10-26 (×3): 2 g via INTRAVENOUS
  Filled 2012-10-25 (×3): qty 50

## 2012-10-25 MED ORDER — CEFAZOLIN SODIUM-DEXTROSE 2-3 GM-% IV SOLR
2.0000 g | Freq: Four times a day (QID) | INTRAVENOUS | Status: DC
  Filled 2012-10-25 (×2): qty 50

## 2012-10-25 MED ORDER — MIDAZOLAM HCL 5 MG/5ML IJ SOLN
INTRAMUSCULAR | Status: AC
  Filled 2012-10-25: qty 5

## 2012-10-25 NOTE — Progress Notes (Signed)
Patient Name: Laurie [name]      SUBJECTIVE: without complaint no chest pain or shortness of breath  Past Medical History  Diagnosis Date  . Bell's palsy 01/05/2010  . GOITER, MULTINODULAR 05/14/2010  . HEPATITIS 12/06/2007  . Thyromegaly     mild  . 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 Hyperlipidemia 04/18/2012    Intolerance to several statins   . Hypertensive heart disease without CHF   . HIstory of Bell's palsy     October 2012   . Obesity (BMI 30-39.9)   . Peripheral neuropathy   . Sleep apnea   . CAD (coronary artery disease)     Cath  07/21/1999  mild ostial, 80% stenosis proximal LAD, 90% stenosis proximal Diag 1, 95% stenosis proximal OM 1, 80% stenosis proximal RCA, 95% stenosis mid RCA    CABG 07/22/99  LIMA to LAD, SVG to dx, SVG to OM, SVG to PDA  Dr. Tarri Abernethy 2012 no ischemia EF 67%   . HIstory of Bell's palsy     October 2012     PHYSICAL EXAM Filed Vitals:   10/24/12 1352 10/24/12 1643 10/24/12 2100 10/25/12 0500  BP: 105/51 117/68 113/61 116/58  Pulse: 67 60 60 58  Temp: 98.4 F (36.9 C) 98.8 F (37.1 C) 99.2 F (37.3 C) 98.1 F (36.7 C)  TempSrc: Oral     Resp: 20 20 18 18   Height:      Weight:      SpO2: 97% 97% 96% 93%   Well developed and nourished in no acute distress HENT normal Neck supple with JVP-flat Clear Device pocket without hematoma or erythema  Regular rate and rhythm, no murmurs or gallops Abd-soft with active BS No Clubbing cyanosis edema Skin-warm and dry A & Oriented  Grossly normal sensory and motor function  TELEMETRY: Reviewed telemetry pt in  P-synchronous/ AV  pacing  until 130 or so then v pacing with atrial failure to sense   Intake/Output Summary (Last 24 hours) at 10/25/12 0753 Last data filed at 10/25/12 0700  Gross per 24 hour  Intake    480 ml  Output    500 ml  Net    -20 ml    LABS: Basic Metabolic Panel:  Recent Labs Lab  10/21/12 1408 10/22/12 1842 10/24/12 0515  NA 136 136 139  K 3.7 4.1 4.1  CL 98 100 103  CO2 24 26 28   GLUCOSE 239* 226* 117*  BUN 15 13 13   CREATININE 0.61 0.61 0.66  CALCIUM 10.0 9.6 9.4   Cardiac Enzymes: No results found for this basename: CKTOTAL, CKMB, CKMBINDEX, TROPONINI,  in the last 72 hours CBC:  Recent Labs Lab 10/21/12 1408 10/22/12 0500 10/22/12 1842 10/23/12 0510 10/24/12 0515  WBC 9.1 8.4 7.7 9.0 8.3  HGB 14.4 13.1 12.7 13.1 13.3  HCT 41.8 38.1 36.7 38.6 39.3  MCV 90.9 90.9 90.6 91.7 92.3  PLT 222 205 194 197 209   PROTIME:  Recent Labs  10/22/12 1842  LABPROT 11.8  INR 0.87    Recent Labs  10/23/12 0510  CHOL 188  HDL 37*  LDLCALC 111*  TRIG 199*  CHOLHDL 5.1   Thyroid Function Tests:  Recent Labs  10/22/12 1240  TSH 1.429   Anemia Panel: No results found for this basename: VITAMINB12, FOLATE, FERRITIN, TIBC, IRON, RETICCTPCT,  in the last 72 hours   Device Interrogation: pending   ASSESSMENT  AND PLAN:  CHB S/p pacer Atrial lead dislodgement--Presumed  There is ventricular pacing and a delay spike.  It is possible that the atrial lead is the first spike  Will interrogate and will need revision CXR pending and interrogation as well  Signed, Sherryl Manges MD  10/25/2012

## 2012-10-25 NOTE — Consult Note (Signed)
[facility] Consult F/U Note    JESLIE LOWE WUJ:[id number] DOB: 04-Sep-1946 DOA: 10/21/2012 PCP: Rogelia Boga, MD  Brief narrative: 67 y.o. female has extensive PMH-Type 2 DM/severe insulin resistance, HTN, HL, CAD, S/P CABG in 2000, gets yearly nuclear stress test-last one in August of 2013 said to be negative, OSA-noncompliant with CPAP since 2010, peripheral neuropathy and morbid obesity. She was brought to the emergency room on 2/8 by her spouse due to 2 episodes of passing out. At approximately 10 AM on 2/8, she was sitting on her recliner when she felt dizzy and passed out. Her son who was sitting next to her noticed that she slumped her head backwards and started snoring. This episode lasted for less than a minute. Patient woke up and felt funny. She denied any preceding headache, chest pain or palpitations. Her CBG prior to this episode was 234 mg/dL. By this time her spouse came in and advised that they should go to the hospital to get checked out. She went to the toilet and was sitting on the toilet when her husband heard her snore followed by a loud noise. Patient indicated that she felt dizzy and then passed out. He found her lying face forward on the floor with bruise on the right forehead. This episode also lasted less than a minute. Neither episode was associated with seizure-like activity or stroke-like symptoms. She woke up and was coherent. They decided to come to the emergency room where vital signs were stable, head CT was negative, but POC troponin elevated at 1.6. EKG revealed old right bundle branch block and left anterior fascicular block.  Recomendations:  Syncope due to CHB Pacer placed 2/10  Atrial lead repositioned 2/12   CAD s/p CABG 2000 As per primary service (Cardiology)  Uncontrolled DM2 CBG:  Recent Labs Lab 10/24/12 1119 10/24/12 1635 10/24/12 1901 10/24/12 2124 10/25/12 0722  GLUCAP 175* 137* 152* 179* 160*   Last Hb A1 C 05/2012  8.3  No change in current dose of insulin recommended HTN BP reasonably controlled at this time - follow trend w/o change today - was previously on BB   HLD Lipid panel confirms need for tx - unfortunately, pt reports allergy/intolerance to statins/tricor - will defer to Cardiology as to other options  OSA  noncompliant w/ CPAP - educate on need for compliance  Obesity Educate on need for weight loss  Code Status: FULL  Antibiotics: none  DVT prophylaxis: Heparin infusion   HPI/Subjective: Had atrial lead replacement today  Objective: Blood pressure 106/57, pulse 85, temperature 98.2 F (36.8 C), temperature source Oral, resp. rate 18, height 5' 3.5" (1.613 m), weight 93 kg (205 lb 0.4 oz), SpO2 93.00%.  Intake/Output Summary (Last 24 hours) at 10/25/12 1639 Last data filed at 10/25/12 0700  Gross per 24 hour  Intake    240 ml  Output      0 ml  Net    240 ml    Exam: Alert and oriented x3 Chest clear to auscultation Heart regular rate and rhythm  Data Reviewed: Basic Metabolic Panel:  Recent Labs Lab 10/21/12 1408 10/22/12 1842 10/24/12 0515  NA 136 136 139  K 3.7 4.1 4.1  CL 98 100 103  CO2 24 26 28   GLUCOSE 239* 226* 117*  BUN 15 13 13   CREATININE 0.61 0.61 0.66  CALCIUM 10.0 9.6 9.4   CBC:  Recent Labs Lab 10/21/12 1408 10/22/12 0500 10/22/12 1842 10/23/12 0510 10/24/12 0515  WBC 9.1 8.4 7.7  9.0 8.3  HGB 14.4 13.1 12.7 13.1 13.3  HCT 41.8 38.1 36.7 38.6 39.3  MCV 90.9 90.9 90.6 91.7 92.3  PLT 222 205 194 197 209   Cardiac Enzymes:  Recent Labs Lab 10/21/12 1644 10/21/12 2344 10/22/12 0445  CKTOTAL 134  --  140  CKMB 5.8*  --  4.7*  TROPONINI 1.52* 1.70* 1.51*   CBG:  Recent Labs Lab 10/24/12 1119 10/24/12 1635 10/24/12 1901 10/24/12 2124 10/25/12 0722  GLUCAP 175* 137* 152* 179* 160*    Recent Results (from the past 240 hour(s))  MRSA PCR SCREENING     Status: None   Collection Time    10/21/12 11:23 PM       Result Value Range Status   MRSA by PCR NEGATIVE  NEGATIVE Final   Comment:            The GeneXpert MRSA Assay (FDA     approved for NASAL specimens     only), is one component of a     comprehensive MRSA colonization     surveillance program. It is not     intended to diagnose MRSA     infection nor to guide or     monitor treatment for     MRSA infections.    Studies:  Recent x-ray studies have been reviewed in detail by the Attending Physician  Scheduled Meds: . acidophilus  1 capsule Oral Daily  . aspirin EC  81 mg Oral Daily  .  ceFAZolin (ANCEF) IV  2 g Intravenous Q6H  . chlorthalidone  25 mg Oral Daily  . enoxaparin (LOVENOX) injection  40 mg Subcutaneous Q24H  . insulin aspart  0-20 Units Subcutaneous TID WC  . insulin aspart  0-5 Units Subcutaneous QHS  . insulin aspart  140 Units Subcutaneous Q breakfast  . insulin aspart  190 Units Subcutaneous Q1200  . insulin aspart  290 Units Subcutaneous QAC supper  . insulin glargine  60 Units Subcutaneous QHS  . lisinopril  40 mg Oral Daily  . loratadine  10 mg Oral Daily  . metoprolol succinate  50 mg Oral Daily  . pantoprazole  40 mg Oral Daily  . potassium chloride SA  20 mEq Oral BID    Katelind Pytel [phone]  If 7PM-7AM, please contact night-coverage [email or web address] Password [facility] 10/25/2012, 4:39 PM   LOS: 4 days

## 2012-10-25 NOTE — Op Note (Signed)
EP Procedure Note  Procedure: Atrial lead revision Indication: atrial lead dislodgement Findings: After informed consent was obtained, the patient was taken to the diagnostic EP lab in the fasting state. After the usual preparation and draping, intravenous that nail and Versed were given for sedation. 30 cc of lidocaine was infiltrated into the left infraclavicular region. A 5 cm incision was carried out over the old pacemaker incision. Electrocautery was utilized to dissect down to the pacemaker pocket. The pacemaker generator was removed with gentle traction. The atrial lead was disconnected from the pacemaker generator. The generator was wrapped in antibiotic gauze. A stylette was advanced into the atrium and the helix retracted. The atrial lead which had fallen into the right ventricle, was withdrawn into the right atrium. Mapping in the right atrium was carried out. The lead was actively fixed on the anterolateral portion of the right atrium. An injury current was present. P waves were 3. The impedance 460 ohms. The threshold was 1 V at 0.5 ms. The lead was secured to the subpectoralis fashion with a silk suture. The sewing sleeve was secured with silk suture. The pocket was ear gated with antibiotic irrigation. The atrial lead was reconnected to the dual-chamber pacemaker. The generator was placed back in the pocket. Additional antibiotic irrigation was utilized to irrigate the pocket. The incision was closed with 2-0 Vicryl and 3-0 Vicryl. Benzoin and Steri-Strips were pain on the skin, and a pressure dressing applied, and the patient returned to her room in satisfactory condition.  Complications: There were no immediate procedural complications.  Results: This demonstrate successful atrial lead revision in a patient with underlying complete heart block, status post permanent pacemaker insertion.  Lewayne Bunting, M.D.

## 2012-10-26 ENCOUNTER — Inpatient Hospital Stay (HOSPITAL_COMMUNITY): Payer: Medicare HMO

## 2012-10-26 LAB — GLUCOSE, CAPILLARY
Glucose-Capillary: 112 mg/dL — ABNORMAL HIGH (ref 70–99)
Glucose-Capillary: 151 mg/dL — ABNORMAL HIGH (ref 70–99)

## 2012-10-26 MED ORDER — EZETIMIBE 10 MG PO TABS
10.0000 mg | ORAL_TABLET | Freq: Every day | ORAL | Status: DC
  Filled 2012-10-26: qty 1

## 2012-10-26 MED ORDER — OMEPRAZOLE 20 MG PO CPDR: 20.0000 mg | DELAYED_RELEASE_CAPSULE | Freq: Every day | ORAL | Status: DC | PRN

## 2012-10-26 MED ORDER — EZETIMIBE 10 MG PO TABS: 10.0000 mg | ORAL_TABLET | Freq: Every day | ORAL | Status: DC

## 2012-10-26 NOTE — Progress Notes (Signed)
Pt provided with dc instructions and education. Pt verbalized understanding. Pt aware of arm restrictions and limitations. Pt has no questions and able to teachback learning. Pt provided with prescriptions. IV removed with tip intact. Heart monitor cleaned and returned to front. Levonne Spiller, RN

## 2012-10-26 NOTE — Progress Notes (Signed)
Subjective:  No CP  NO SOB  Objective:  Vital Signs in the last 24 hours: Temp:  [98.1 F (36.7 C)-98.8 F (37.1 C)] 98.1 F (36.7 C) (02/13 0625) Pulse Rate:  [63-89] 81 (02/13 0625) Resp:  [18-20] 20 (02/13 0625) BP: (105-147)/(50-92) 127/68 mmHg (02/13 0625) SpO2:  [93 %-96 %] 93 % (02/13 0625)  Intake/Output from previous day:   Intake/Output from this shift:    Physical Exam: Well appearing NAD HEENT: Unremarkable Neck:  No JVD, no thyromegally Lymphatics:  No adenopathy Back:  No CVA tenderness Lungs:  Clear HEART:  Regular rate rhythm, no murmurs, no rubs, no clicks Abd:  Flat, positive bowel sounds, no organomegally, no rebound, no guarding Ext:  2 plus pulses, no edema, no cyanosis, no clubbing.  L arm in sling Skin:  No rashes no nodules Neuro:  CN II through XII intact, motor grossly intact Tele:  SR Lab Results:  Recent Labs  10/24/12 0515  WBC 8.3  HGB 13.3  PLT 209    Recent Labs  10/24/12 0515  NA 139  K 4.1  CL 103  CO2 28  GLUCOSE 117*  BUN 13  CREATININE 0.66   No results found for this basename: TROPONINI, CK, MB,  in the last 72 hours Hepatic Function Panel No results found for this basename: PROT, ALBUMIN, AST, ALT, ALKPHOS, BILITOT, BILIDIR, IBILI,  in the last 72 hours No results found for this basename: CHOL,  in the last 72 hours No results found for this basename: PROTIME,  in the last 72 hours  Imaging: Dg Chest 2 View  10/26/2012  *RADIOLOGY REPORT*  Clinical Data: Pacemaker insertion  CHEST - 2 VIEW  Comparison: 10/25/2012  Findings: Dual lead pacemaker.  Right atrial lead has been removed and is now in the right atrium.  Right ventricular lead in the right ventricle.  No pneumothorax.  There is mild atelectasis in the lung bases.  IMPRESSION: Right atrial lead now within the right atrium.  Right ventricular lead within the right ventricle.  Mild bibasilar atelectasis.   Original Report Authenticated By: Janeece Riggers, M.D.     Dg Chest Port 1 View  10/25/2012  *RADIOLOGY REPORT*  Clinical Data: Lead dislodgement  PORTABLE CHEST - 1 VIEW  Comparison: 10/24/2012  Findings: Left chest wall dual lead pacer device is identified. One lead is identified in the projection of the right ventricle. Within the right atrium or proximal right ventricle oriented towards the apex.  There is changes from previous median sternotomy and CABG procedure.  No pleural effusion or edema identified.  IMPRESSION:  1.  Left chest wall pacer device with leads oriented as described above. The right atrial appendage leads appears directed towards the ventricular apex.  2.  No acute cardiopulmonary abnormalities.   Original Report Authenticated By: Signa Kell, M.D.     Cardiac Studies:  Assessment/Plan:    1.  CHB S/p repositioning of atrial lead.  Doing well  CXR OK  2.  HL  Will try Zetia  3.  DM  Continue meds  4.  HTN  Continue meds.  LOS: 5 days    Laurie [name] 10/26/2012, 8:52 AM

## 2012-10-26 NOTE — Discharge Summary (Signed)
Physician Discharge Summary  Patient ID: Laurie [name] MRN: [id number] DOB/AGE: 67-Jan-1947 67 y.o.  Admit date: 10/21/2012 Discharge date: 10/26/2012  Primary Physician: Dr. Juanita Laster  Primary Discharge Diagnosis: 1. Syncope due to high degree AV block and complete heart block  Secondary Discharge Diagnosis: 2. Coronary artery disease with patent vein grafts and mammary graft  3. Insulin-dependent diabetes mellitus with insulin resistance 4. Hypertensive heart disease 5. Obesity 6. Hyperlipidemia with statin intolerance 7. Non-ST elevation myocardial infarction due  to demand ischemia from bradycardia  Procedures: Insertion of temporary pacemaker by Dr. Nicki Guadalajara Cardiac catheterization with graft angiograms by Dr. Donnie Aho Insertion of permanent dual-chamber pacemaker by Dr. Sherryl Manges Revision of right atrial lead dislodgment by Dr. Sharrell Ku  Consults:  Dr. Nena Jordan Course: This 67 year old female has coronary disease and previous bypass grafting, hypertension, hyperlipidemia and diabetes with insulin resistance. She was admitted to the hospital with syncope that occurred when she was in her recliner and then she had another episode when she was in the bathroom. She was admitted to the hospital when she was found to have an elevated point-of-care troponin. She had known bifascicular block on EKG.  The patient was initially admitted to the coronary care unit. That evening she developed recurrent dizziness and had high degree AV block with greater than 9 seconds of P waves is no corresponding QRS complex. She was taken to the cardiac catheterization laboratory on 10/22/12 Dr. Tresa Endo who was finally able to get a pacemaker and despite multiple attempts in both groins. She was seen in consultation by Dr. Ladona Ridgel who felt that she would need to have a permanent transvenous pacemaker implanted. Custody elevated troponins, she was taken to the cardiac catheterization  laboratory on 2/10 with findings of patent vein grafts and a patent mammary graft, left ventricular function was normal.  She underwent permanent pacemaker implantation by Dr. Graciela Husbands later on that afternoon with implantation of a Medtronic Adapta serial number end MB [id number] generator, the right atrial lead was a Medtronic 5076 serial number PJM [id number]. Right ventricular lead was a Medtronic 5076 serial number PJM [id number]. She was observed in the unit overnight moved to the floor. She was noted to have some episodes of non-capture of the atrial lead and on since then. It was determined that she had atrial lead dislodgment and she was taken back to the cardiac catheterization laboratory on 12/12 for atrial lead revision. The atrial lead was working fine afterwards. Her pacemaker site and pacemaker were checked the next morning were stable and x-ray showed good placement of the pacemaking leads. She is discharged home in improved condition.  She had significant insulin resistance noted while she was in the hospital and [place] very large doses of insulin but has been managed by her endocrinologist as an outpatient. She has previous statin intolerance and was started on Zetia and we will determine if she will tolerate this or not.  Discharge Exam: Blood pressure 127/68, pulse 81, temperature 98.1 F (36.7 C), temperature source Oral, resp. rate 20, height 5' 3.5" (1.613 m), weight 93 kg (205 lb 0.4 oz), SpO2 93.00%.   Lungs clear, no S3 or murmur, pacemaker site clean and dry  Labs: CBC:   Lab Results  Component Value Date   WBC 8.3 10/24/2012   HGB 13.3 10/24/2012   HCT 39.3 10/24/2012   MCV 92.3 10/24/2012   PLT 209 10/24/2012   CMP:  Recent Labs Lab 10/24/12 0515  NA 139  K 4.1  CL 103  CO2 28  BUN 13  CREATININE 0.66  CALCIUM 9.4  GLUCOSE 117*   Lipid Panel     Component Value Date/Time   CHOL 188 10/23/2012 0510   TRIG 199* 10/23/2012 0510   HDL 37* 10/23/2012 0510   CHOLHDL 5.1  10/23/2012 0510   VLDL 40 10/23/2012 0510   LDLCALC 111* 10/23/2012 0510    Radiology: Cardiomegaly, clear lung fields, leads in good position  EKG: Sinus rhythm with right bundle branch block and left axis deviation  Discharge Medications:   Medication List    TAKE these medications       AMBULATORY NON FORMULARY MEDICATION  Diabetic Nutrition Pack with [place], Chromium and Vitamin D3 1500 IU     AMBULATORY NON FORMULARY MEDICATION  Omega Q Plus    Once daily     AMBULATORY NON FORMULARY MEDICATION  Per Q Sorb     aspirin 81 MG EC tablet  Take 1 tablet (81 mg total) by mouth daily.     b complex vitamins tablet  Take 1 tablet by mouth daily.     beta carotene w/minerals tablet  Take 1 tablet by mouth daily.     chlorthalidone 25 MG tablet  Commonly known as:  HYGROTON  Take 25 mg by mouth daily.     ezetimibe 10 MG tablet  Commonly known as:  ZETIA  Take 1 tablet (10 mg total) by mouth daily.     insulin glargine 100 UNIT/ML injection  Commonly known as:  LANTUS  Inject 60 Units into the skin at bedtime.     insulin lispro 100 UNIT/ML injection  Commonly known as:  HUMALOG  140 units with breakfast, 190 units with lunch, and 290 units with evening meal- add  30 additional units if blood sugar is greater than 300     ketoconazole 2 % cream  Commonly known as:  NIZORAL  Apply 1 application topically daily as needed (for face rash).     lisinopril 40 MG tablet  Commonly known as:  PRINIVIL,ZESTRIL  Take 40 mg by mouth daily.     loratadine 10 MG tablet  Commonly known as:  CLARITIN  Take 10 mg by mouth daily.     metoprolol succinate 50 MG 24 hr tablet  Commonly known as:  TOPROL-XL  Take 50 mg by mouth daily. Take with or immediately following a meal.     MOVE FREE PO  Take 1 capsule by mouth daily.     multivitamin tablet  Take 1 tablet by mouth daily.     omeprazole 20 MG capsule  Commonly known as:  PRILOSEC  Take 1 capsule (20 mg total)  by mouth daily as needed (acid flux).     potassium chloride SA 20 MEQ tablet  Commonly known as:  K-DUR,KLOR-CON  Take 20 mEq by mouth 2 (two) times daily.     PROBIOTIC DAILY Caps  Take 1 capsule by mouth daily.     sodium chloride 0.65 % nasal spray  Commonly known as:  OCEAN  Place 1 spray into the nose as needed for congestion.     Vitamin D3 50000 UNITS Caps  Take 1 tablet by mouth daily.        Followup plans and appointments: To see Dr. Graciela Husbands for a wound check. She was also given appointments to see Dr. Donnie Aho in 2 weeks. Instructions were given concerning care of pacemaker site and prescription of left arm movement  Time spent with patient to include physician time: 45 minutes Signed: W. Ashley Royalty. MD [facility] 10/26/2012, 11:34 AM

## 2012-10-26 NOTE — Progress Notes (Signed)
Received phone call from Dr Algie Coffer concerning new R-sided BBB concerning for lead dislodgement through septum. Dr Donnie Aho on floor and verbally made aware. Levonne Spiller, RN

## 2012-10-26 NOTE — Progress Notes (Signed)
Patient seen before discharge. I have advised her to resume her insulin regimen without changes. Laurie [name]

## 2012-10-31 ENCOUNTER — Encounter: Payer: Self-pay | Admitting: Internal Medicine

## 2012-10-31 ENCOUNTER — Other Ambulatory Visit: Payer: Self-pay

## 2012-10-31 ENCOUNTER — Ambulatory Visit (INDEPENDENT_AMBULATORY_CARE_PROVIDER_SITE_OTHER): Payer: Medicare HMO | Admitting: *Deleted

## 2012-10-31 DIAGNOSIS — I441 Atrioventricular block, second degree: Secondary | ICD-10-CM

## 2012-10-31 DIAGNOSIS — Z950 Presence of cardiac pacemaker: Secondary | ICD-10-CM

## 2012-10-31 NOTE — Progress Notes (Signed)
Patient presents for wound check of newly implanted pacemaker.  Wound well healed.  Evaluated also by Dr Graciela Husbands today.   No problems with shortness of breath, chest pain, palpitations, or syncope.  Device interrogated and found to be functioning normally.  No changes made today.  See PaceArt report for full details.  Plan ROV with Dr. Graciela Husbands in 3 months.  Gypsy Balsam, RN, BSN 10/31/2012 3:32 PM

## 2012-11-01 ENCOUNTER — Ambulatory Visit (INDEPENDENT_AMBULATORY_CARE_PROVIDER_SITE_OTHER): Payer: Medicare HMO | Admitting: Endocrinology

## 2012-11-01 VITALS — BP 132/70 | HR 80 | Wt 205.0 lb

## 2012-11-01 DIAGNOSIS — E1165 Type 2 diabetes mellitus with hyperglycemia: Secondary | ICD-10-CM

## 2012-11-01 LAB — HEMOGLOBIN A1C: Hgb A1c MFr Bld: 8.1 % — ABNORMAL HIGH (ref 4.6–6.5)

## 2012-11-01 MED ORDER — INSULIN LISPRO 100 UNIT/ML ~~LOC~~ SOLN: SUBCUTANEOUS | Status: DC

## 2012-11-01 NOTE — Patient Instructions (Addendum)
check your blood sugar 4 times a day--before the 3 meals, and at bedtime.  also check if you have symptoms of your blood sugar being too high or too low.  please keep a record of the readings and bring it to your next appointment here.  please call us sooner if you are having low blood sugar episodes.   Please come back for a follow-up appointment in 3 months. Please reduce your humalog to 100 units with breakfast, 150 units with lunch, and 200 units with evening meal.  With time, you will probably need to re-increase your insulin.  Please call if you need help with this. Also, reduce your lantus to 30 units at bedtime.   blood tests are being requested for you today.  We'll contact you with results.

## 2012-11-01 NOTE — Progress Notes (Signed)
Subjective:    Patient ID: Laurie [name], female    DOB: July 31, 1946, 67 y.o.   MRN: [id number]  HPI Pt returns for f/u of insulin-requiring DM (dx'ed 1995; complicated by CAD and peripheral sensory neuropathy; characterized by severe insulin resistance; she has never had severe hypoglycemia). she required very little insulin during a recent hospitalization.  Even after she was d/c'ed home, she required less than the previous insulin dosage.  she brings a record of her cbg's which i have reviewed today.  cbg's since hospital d/c are 60-120.  There is no trend throughout the day. Past Medical History  Diagnosis Date  . Bell's palsy 01/05/2010  . GOITER, MULTINODULAR 05/14/2010  . HEPATITIS 12/06/2007  . Thyromegaly     mild  . 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 Hyperlipidemia 04/18/2012    Intolerance to several statins   . Hypertensive heart disease without CHF   . HIstory of Bell's palsy     October 2012   . Obesity (BMI 30-39.9)   . Peripheral neuropathy   . Sleep apnea   . CAD (coronary artery disease)     Cath  07/21/1999  mild ostial, 80% stenosis proximal LAD, 90% stenosis proximal Diag 1, 95% stenosis proximal OM 1, 80% stenosis proximal RCA, 95% stenosis mid RCA    CABG 07/22/99  LIMA to LAD, SVG to dx, SVG to OM, SVG to PDA  Dr. Tarri Abernethy 2012 no ischemia EF 67%   . HIstory of Bell's palsy     October 2012     Past Surgical History  Procedure Laterality Date  . Coronary artery bypass graft  2000  . Tubal ligation    . Biopsy thyroid  05/2010    percutaneous  . Foot surgery    . Carpal tunnel release    . Dental surgery      History   Social History  . Marital Status: Married    Spouse Name: N/A    Number of Children: 2  . Years of Education: N/A   Occupational History  .     Social History Main Topics  . Smoking status: Never Smoker   . Smokeless tobacco: Never Used  . Alcohol Use: No  .  Drug Use: No  . Sexually Active: No   Other Topics Concern  . Not on file   Social History Narrative   Does not work outside the home    Current Outpatient Prescriptions on File Prior to Visit  Medication Sig Dispense Refill  . AMBULATORY NON FORMULARY MEDICATION Diabetic Nutrition Pack with Cinannon, Chromium and Vitamin D3 1500 IU      . AMBULATORY NON FORMULARY MEDICATION Omega Q Plus  Once daily      . AMBULATORY NON FORMULARY MEDICATION Per Q Sorb      . aspirin EC 81 MG EC tablet Take 1 tablet (81 mg total) by mouth daily.      [name] b complex vitamins tablet Take 1 tablet by mouth daily.      . beta carotene w/minerals (OCUVITE) tablet Take 1 tablet by mouth daily.      . chlorthalidone (HYGROTON) 25 MG tablet Take 25 mg by mouth daily.      . Cholecalciferol (VITAMIN D3) 50000 UNITS CAPS Take 1 tablet by mouth daily.      [name] ezetimibe (ZETIA) 10 MG tablet Take 1 tablet (10 mg total) by mouth daily.  30 tablet  12  . Glucosamine-Chondroitin (MOVE FREE PO) Take 1 capsule by mouth daily.      . insulin glargine (LANTUS) 100 UNIT/ML injection Inject 30 Units into the skin at bedtime.       [name] ketoconazole (NIZORAL) 2 % cream Apply 1 application topically daily as needed (for face rash).      [name] lisinopril (PRINIVIL,ZESTRIL) 40 MG tablet Take 40 mg by mouth daily.      [name] loratadine (CLARITIN) 10 MG tablet Take 10 mg by mouth daily.      . metoprolol succinate (TOPROL-XL) 50 MG 24 hr tablet Take 50 mg by mouth daily. Take with or immediately following a meal.      . Multiple Vitamin (MULTIVITAMIN) tablet Take 1 tablet by mouth daily.        [name] omeprazole (PRILOSEC) 20 MG capsule Take 1 capsule (20 mg total) by mouth daily as needed (acid flux).  30 capsule  12  . potassium chloride SA (K-DUR,KLOR-CON) 20 MEQ tablet Take 20 mEq by mouth 2 (two) times daily.      . Probiotic Product (PROBIOTIC DAILY) CAPS Take 1 capsule by mouth daily.      . sodium chloride (OCEAN) 0.65 % nasal spray Place  1 spray into the nose as needed for congestion.      [name] omeprazole (PRILOSEC) 20 MG capsule Take 20 mg by mouth daily as needed (acid flux).       No current facility-administered medications on file prior to visit.    Allergies  Allergen Reactions  . Statins     Intolerance to several  . Tricor (Fenofibrate)     Leg pain    Family History  Problem Relation Age of Onset  . Diabetes Father   . Heart disease Father   . Diabetes Other     Siblings and 2 children  . Heart disease Other     CAD  . Diabetes Mother     BP 132/70  Pulse 80  Wt 205 lb (92.987 kg)  BMI 35.74 kg/m2  SpO2 98%   Review of Systems Denies LOC    Objective:   Physical Exam VITAL SIGNS:  See vs page GENERAL: no distress Pulses: dorsalis pedis intact bilat.  Feet: no deformity. no ulcer on the feet. feet are of normal color and temp. Trace bilat leg edema. There is a healed vein harvest scar at the left leg.  Neuro: sensation is intact to touch on the feet.  Lab Results  Component Value Date   HGBA1C 8.1* 11/01/2012      Assessment & Plan:  DM: insulin dosage is decreased by her hospital stay.  It is common for this reduced requirement to persist for a while after hosp d/c, but will prob return to previous amount, with time

## 2012-11-06 LAB — PACEMAKER DEVICE OBSERVATION
AL AMPLITUDE: 2.8 mv
AL IMPEDENCE PM: 480 Ohm
BATTERY VOLTAGE: 2.8 V
RV LEAD AMPLITUDE: 22.4 mv
RV LEAD IMPEDENCE PM: 769 Ohm

## 2012-11-07 ENCOUNTER — Telehealth: Payer: Self-pay | Admitting: *Deleted

## 2012-11-07 NOTE — Telephone Encounter (Signed)
Prior authoriazation forms to Dr. Everardo All for signature to be faxed to [facility]

## 2012-11-10 ENCOUNTER — Telehealth: Payer: Self-pay | Admitting: *Deleted

## 2012-11-10 NOTE — Telephone Encounter (Signed)
FAXEDTO [employer] FOR MEDICATION HUMALOG

## 2012-11-13 ENCOUNTER — Other Ambulatory Visit: Payer: Self-pay | Admitting: Internal Medicine

## 2012-12-04 ENCOUNTER — Encounter: Payer: Self-pay | Admitting: Internal Medicine

## 2012-12-04 ENCOUNTER — Ambulatory Visit (INDEPENDENT_AMBULATORY_CARE_PROVIDER_SITE_OTHER): Payer: Medicare HMO | Admitting: Internal Medicine

## 2012-12-04 VITALS — BP 130/80 | Temp 98.5°F | Wt 205.0 lb

## 2012-12-04 DIAGNOSIS — E785 Hyperlipidemia, unspecified: Secondary | ICD-10-CM

## 2012-12-04 DIAGNOSIS — E1165 Type 2 diabetes mellitus with hyperglycemia: Secondary | ICD-10-CM

## 2012-12-04 DIAGNOSIS — I2510 Atherosclerotic heart disease of native coronary artery without angina pectoris: Secondary | ICD-10-CM

## 2012-12-04 DIAGNOSIS — E118 Type 2 diabetes mellitus with unspecified complications: Secondary | ICD-10-CM

## 2012-12-04 DIAGNOSIS — IMO0002 Reserved for concepts with insufficient information to code with codable children: Secondary | ICD-10-CM

## 2012-12-04 DIAGNOSIS — I119 Hypertensive heart disease without heart failure: Secondary | ICD-10-CM

## 2012-12-04 DIAGNOSIS — Z950 Presence of cardiac pacemaker: Secondary | ICD-10-CM

## 2012-12-04 NOTE — Patient Instructions (Signed)
Limit your sodium (Salt) intake    It is important that you exercise regularly, at least 20 minutes 3 to 4 times per week.  If you develop chest pain or shortness of breath seek  medical attention.  You need to lose weight.  Consider a lower calorie diet and regular exercise. 

## 2012-12-04 NOTE — Progress Notes (Signed)
Subjective:    Patient ID: Laurie [name], female    DOB: 1946-07-29, 67 y.o.   MRN: [id number]  HPI  67 year old patient who is seen today in followup. Last month she was hospitalized for high degree AV block in the setting of non-ST segment elevated MI. She is now status post permanent pacemaker insertion. She has insulin requiring diabetes and is followed by endocrinology. Hemoglobin A1c last month 8.0. She has coronary artery disease dyslipidemia and exogenous obesity.  Hospital records reviewed  Past Medical History  Diagnosis Date  . Bell's palsy 01/05/2010  . GOITER, MULTINODULAR 05/14/2010  . HEPATITIS 12/06/2007  . Thyromegaly     mild  . 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 Hyperlipidemia 04/18/2012    Intolerance to several statins   . Hypertensive heart disease without CHF   . HIstory of Bell's palsy     October 2012   . Obesity (BMI 30-39.9)   . Peripheral neuropathy   . Sleep apnea   . CAD (coronary artery disease)     Cath  07/21/1999  mild ostial, 80% stenosis proximal LAD, 90% stenosis proximal Diag 1, 95% stenosis proximal OM 1, 80% stenosis proximal RCA, 95% stenosis mid RCA    CABG 07/22/99  LIMA to LAD, SVG to dx, SVG to OM, SVG to PDA  Dr. Tarri Abernethy 2012 no ischemia EF 67%   . HIstory of Bell's palsy     October 2012     History   Social History  . Marital Status: Married    Spouse Name: N/A    Number of Children: 2  . Years of Education: N/A   Occupational History  .     Social History Main Topics  . Smoking status: Never Smoker   . Smokeless tobacco: Never Used  . Alcohol Use: No  . Drug Use: No  . Sexually Active: No   Other Topics Concern  . Not on file   Social History Narrative   Does not work outside the home    Past Surgical History  Procedure Laterality Date  . Coronary artery bypass graft  2000  . Tubal ligation    . Biopsy thyroid  05/2010    percutaneous  .  Foot surgery    . Carpal tunnel release    . Dental surgery      Family History  Problem Relation Age of Onset  . Diabetes Father   . Heart disease Father   . Diabetes Other     Siblings and 2 children  . Heart disease Other     CAD  . Diabetes Mother     Allergies  Allergen Reactions  . Statins     Intolerance to several  . Tricor (Fenofibrate)     Leg pain    Current Outpatient Prescriptions on File Prior to Visit  Medication Sig Dispense Refill  . AMBULATORY NON FORMULARY MEDICATION Diabetic Nutrition Pack with Cinannon, Chromium and Vitamin D3 1500 IU      . AMBULATORY NON FORMULARY MEDICATION Omega Q Plus  Once daily      . AMBULATORY NON FORMULARY MEDICATION Per Q Sorb      . aspirin EC 81 MG EC tablet Take 1 tablet (81 mg total) by mouth daily.      [name] b complex vitamins tablet Take 1 tablet by mouth daily.      . beta carotene w/minerals (OCUVITE) tablet Take 1 tablet  by mouth daily.      . chlorthalidone (HYGROTON) 25 MG tablet TAKE ONE TABLET BY MOUTH EVERY DAY  90 tablet  1  . Cholecalciferol (VITAMIN D3) 50000 UNITS CAPS Take 1 tablet by mouth daily.      [name] ezetimibe (ZETIA) 10 MG tablet Take 1 tablet (10 mg total) by mouth daily.  30 tablet  12  . Glucosamine-Chondroitin (MOVE FREE PO) Take 1 capsule by mouth daily.      . insulin glargine (LANTUS) 100 UNIT/ML injection Inject 30 Units into the skin at bedtime.       . insulin lispro (HUMALOG KWIKPEN) 100 UNIT/ML injection 100 units with breakfast, 150 units with lunch, and 200 units with evening meal, and pen needles 4/day  450 mL  12  . ketoconazole (NIZORAL) 2 % cream Apply 1 application topically daily as needed (for face rash).      [name] lisinopril (PRINIVIL,ZESTRIL) 40 MG tablet TAKE ONE TABLET BY MOUTH DAILY  90 tablet  1  . loratadine (CLARITIN) 10 MG tablet Take 10 mg by mouth daily.      . metoprolol succinate (TOPROL-XL) 50 MG 24 hr tablet TAKE ONE TABLET BY MOUTH EVERY DAY WITH OR IMMEDIATELY FOLLOWING  A MEAL  90 tablet  1  . Multiple Vitamin (MULTIVITAMIN) tablet Take 1 tablet by mouth daily.        [name] omeprazole (PRILOSEC) 20 MG capsule Take 1 capsule (20 mg total) by mouth daily as needed (acid flux).  30 capsule  12  . Probiotic Product (PROBIOTIC DAILY) CAPS Take 1 capsule by mouth daily.      . sodium chloride (OCEAN) 0.65 % nasal spray Place 1 spray into the nose as needed for congestion.      [name] omeprazole (PRILOSEC) 20 MG capsule Take 20 mg by mouth daily as needed (acid flux).       No current facility-administered medications on file prior to visit.    BP 130/80  Temp(Src) 98.5 F (36.9 C) (Oral)  Wt 205 lb (92.987 kg)  BMI 35.74 kg/m2       Review of Systems  Constitutional: Negative.   HENT: Negative for hearing loss, congestion, sore throat, rhinorrhea, dental problem, sinus pressure and tinnitus.   Eyes: Negative for pain, discharge and visual disturbance.  Respiratory: Negative for cough and shortness of breath.   Cardiovascular: Negative for chest pain, palpitations and leg swelling.  Gastrointestinal: Negative for nausea, vomiting, abdominal pain, diarrhea, constipation, blood in stool and abdominal distention.  Genitourinary: Negative for dysuria, urgency, frequency, hematuria, flank pain, vaginal bleeding, vaginal discharge, difficulty urinating, vaginal pain and pelvic pain.  Musculoskeletal: Negative for joint swelling, arthralgias and gait problem.  Skin: Negative for rash.  Neurological: Negative for dizziness, syncope, speech difficulty, weakness, numbness and headaches.  Hematological: Negative for adenopathy.  Psychiatric/Behavioral: Negative for behavioral problems, dysphoric mood and agitation. The patient is not nervous/anxious.        Objective:   Physical Exam  Constitutional: She is oriented to person, place, and time. She appears well-developed and well-nourished.  Blood pressure 130/80 Pulse 80-90  HENT:  Head: Normocephalic.  Right Ear:  External ear normal.  Left Ear: External ear normal.  Mouth/Throat: Oropharynx is clear and moist.  Eyes: Conjunctivae and EOM are normal. Pupils are equal, round, and reactive to light.  Neck: Normal range of motion. Neck supple. No thyromegaly present.  Cardiovascular: Normal rate, regular rhythm, normal heart sounds and intact distal pulses.   Pulmonary/Chest: Effort  normal and breath sounds normal.  Abdominal: Soft. Bowel sounds are normal. She exhibits no mass. There is no tenderness.  Musculoskeletal: Normal range of motion.  Lymphadenopathy:    She has no cervical adenopathy.  Neurological: She is alert and oriented to person, place, and time.  Skin: Skin is warm and dry. No rash noted.  Psychiatric: She has a normal mood and affect. Her behavior is normal.          Assessment & Plan:   Diabetes mellitus Hypertension well controlled Coronary artery disease Status post pacemaker insertion  Follow cardiology Followup endocrinology   Recheck here 6 months

## 2013-01-04 ENCOUNTER — Other Ambulatory Visit: Payer: Self-pay | Admitting: *Deleted

## 2013-01-04 MED ORDER — POTASSIUM CHLORIDE CRYS ER 20 MEQ PO TBCR: 40.0000 meq | EXTENDED_RELEASE_TABLET | Freq: Every day | ORAL | Status: DC

## 2013-01-09 ENCOUNTER — Telehealth: Payer: Self-pay | Admitting: Internal Medicine

## 2013-01-09 MED ORDER — POTASSIUM CHLORIDE CRYS ER 20 MEQ PO TBCR: 40.0000 meq | EXTENDED_RELEASE_TABLET | Freq: Two times a day (BID) | ORAL | Status: DC

## 2013-01-09 NOTE — Telephone Encounter (Signed)
Patient called stating that [facility] on [address] states that she need a new rx for potassium to reflect the change from 2 poqd to 2po bid. Please assist as patient has now run out of meds and please call patient when done.

## 2013-01-09 NOTE — Telephone Encounter (Signed)
Left detailed message Rx refill for Potassium 2 pills twice a day was sent to the pharamcy.

## 2013-01-17 ENCOUNTER — Ambulatory Visit: Payer: Medicare HMO | Admitting: Endocrinology

## 2013-01-17 DIAGNOSIS — Z0289 Encounter for other administrative examinations: Secondary | ICD-10-CM

## 2013-01-25 ENCOUNTER — Encounter: Payer: Self-pay | Admitting: Internal Medicine

## 2013-01-25 ENCOUNTER — Ambulatory Visit (INDEPENDENT_AMBULATORY_CARE_PROVIDER_SITE_OTHER): Payer: Medicare HMO | Admitting: Internal Medicine

## 2013-01-25 VITALS — BP 164/87 | HR 96 | Ht 64.0 in | Wt 209.0 lb

## 2013-01-25 DIAGNOSIS — Z950 Presence of cardiac pacemaker: Secondary | ICD-10-CM

## 2013-01-25 DIAGNOSIS — I441 Atrioventricular block, second degree: Secondary | ICD-10-CM

## 2013-01-25 LAB — PACEMAKER DEVICE OBSERVATION
ATRIAL PACING PM: 4
BAMS-0001: 150 {beats}/min
RV LEAD IMPEDENCE PM: 690 Ohm
VENTRICULAR PACING PM: 69

## 2013-01-25 NOTE — Patient Instructions (Signed)
Your physician wants you to follow-up in: February 2015 with Dr. Graciela Husbands. You will receive a reminder letter in the mail two months in advance. If you don't receive a letter, please call our office to schedule the follow-up appointment.  Your physician recommends that you continue on your current medications as directed. Please refer to the Current Medication list given to you today.

## 2013-01-25 NOTE — Assessment & Plan Note (Signed)
The patient's device was interrogated and the information was fully reviewed.  The device was reprogrammed to  Maximize longevity 

## 2013-01-25 NOTE — Progress Notes (Signed)
Patient Care Team: Gordy Savers, MD as PCP - General Othella Boyer, MD as Consulting Physician (Cardiology)   HPI  Laurie [name] is a 67 y.o. female Seen in followup for pacemaker implantation 2/14 for complete heart block and syncope.  He comes in with a multitude of concerns including increasing bleeding from his ability to exercise, some swelling in her feet for which he was referred apparently to vascular/vein specialties  She is a history of coronary artery disease with prior CABG.  LHC 2/14 demonstrated patent vein graft to the diagonal OM RCA and LIMA normal left ventricular function  Past Medical History  Diagnosis Date  . Bell's palsy 01/05/2010  . GOITER, MULTINODULAR 05/14/2010  . HEPATITIS 12/06/2007  . Thyromegaly     mild  . 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 Hyperlipidemia 04/18/2012    Intolerance to several statins   . Hypertensive heart disease without CHF   . HIstory of Bell's palsy     October 2012   . Obesity (BMI 30-39.9)   . Peripheral neuropathy   . Sleep apnea   . CAD (coronary artery disease)     Cath  07/21/1999  mild ostial, 80% stenosis proximal LAD, 90% stenosis proximal Diag 1, 95% stenosis proximal OM 1, 80% stenosis proximal RCA, 95% stenosis mid RCA    CABG 07/22/99  LIMA to LAD, SVG to dx, SVG to OM, SVG to PDA  Dr. Tarri Abernethy 2012 no ischemia EF 67%   . HIstory of Bell's palsy     October 2012     Past Surgical History  Procedure Laterality Date  . Coronary artery bypass graft  2000  . Tubal ligation    . Biopsy thyroid  05/2010    percutaneous  . Foot surgery    . Carpal tunnel release    . Dental surgery      Current Outpatient Prescriptions  Medication Sig Dispense Refill  . AMBULATORY NON FORMULARY MEDICATION Diabetic Nutrition Pack with Cinannon, Chromium and Vitamin D3 1500 IU      . AMBULATORY NON FORMULARY MEDICATION Omega Q Plus  Once daily       . AMBULATORY NON FORMULARY MEDICATION Per Q Sorb      . aspirin EC 81 MG EC tablet Take 1 tablet (81 mg total) by mouth daily.      [name] b complex vitamins tablet Take 1 tablet by mouth daily.      . B-D ULTRAFINE III SHORT PEN 31G X 8 MM MISC       . beta carotene w/minerals (OCUVITE) tablet Take 1 tablet by mouth daily.      . chlorthalidone (HYGROTON) 25 MG tablet TAKE ONE TABLET BY MOUTH EVERY DAY  90 tablet  1  . Cholecalciferol (VITAMIN D3) 50000 UNITS CAPS Take 1 tablet by mouth daily.      [name] ezetimibe (ZETIA) 10 MG tablet Take 1 tablet (10 mg total) by mouth daily.  30 tablet  12  . Glucosamine-Chondroitin (MOVE FREE PO) Take 1 capsule by mouth daily.      . insulin glargine (LANTUS) 100 UNIT/ML injection Inject 30 Units into the skin at bedtime.       . insulin lispro (HUMALOG KWIKPEN) 100 UNIT/ML injection 100 units with breakfast, 150 units with lunch, and 200 units with evening meal, and pen needles 4/day  450 mL  12  . ketoconazole (NIZORAL) 2 % cream Apply  1 application topically daily as needed (for face rash).      [name] lisinopril (PRINIVIL,ZESTRIL) 40 MG tablet TAKE ONE TABLET BY MOUTH DAILY  90 tablet  1  . loratadine (CLARITIN) 10 MG tablet Take 10 mg by mouth daily.      . metoprolol succinate (TOPROL-XL) 50 MG 24 hr tablet TAKE ONE TABLET BY MOUTH EVERY DAY WITH OR IMMEDIATELY FOLLOWING A MEAL  90 tablet  1  . Multiple Vitamin (MULTIVITAMIN) tablet Take 1 tablet by mouth daily.        [name] omeprazole (PRILOSEC) 20 MG capsule Take 20 mg by mouth daily as needed (acid flux).      [name] omeprazole (PRILOSEC) 20 MG capsule Take 1 capsule (20 mg total) by mouth daily as needed (acid flux).  30 capsule  12  . potassium chloride SA (KLOR-CON M20) 20 MEQ tablet Take 2 tablets (40 mEq total) by mouth 2 (two) times daily.  360 tablet  1  . Probiotic Product (PROBIOTIC DAILY) CAPS Take 1 capsule by mouth daily.      [name] PRODIGY NO CODING BLOOD GLUC test strip       . sodium chloride (OCEAN) 0.65 %  nasal spray Place 1 spray into the nose as needed for congestion.       No current facility-administered medications for this visit.    Allergies  Allergen Reactions  . Statins     Intolerance to several  . Tricor (Fenofibrate)     Leg pain    Review of Systems negative except from HPI and PMH  Physical Exam BP 164/87  Pulse 96  Ht 5\' 4"  (1.626 m)  Wt 209 lb (94.802 kg)  BMI 35.86 kg/m2 Well developed and well nourished in no acute distress HENT normal E scleral and icterus clear Neck Supple JVP flat; carotids brisk and full Clear to ausculation Device pocket well healed; without hematoma or erythema Regular rate and rhythm, no murmurs gallops or rub Soft with active bowel sounds No clubbing cyanosis Trace Edema Alert and oriented, grossly normal motor and sensory function Skin Warm and Dry    Assessment and  Plan  for 9 months he would like

## 2013-01-25 NOTE — Assessment & Plan Note (Signed)
Stable post pacing. She is 30% ventricular stent. It is not clear to me whether there is intermittent conduction  PVCs or what. She does describe some pausing on her pulse is that there are some PVCs. In February 2 100% ventricularly sensed today she is 100% ventricular paced

## 2013-02-01 ENCOUNTER — Encounter: Payer: Self-pay | Admitting: Cardiology

## 2013-02-01 NOTE — Progress Notes (Signed)
Patient ID: Laurie [name], female   DOB: [birth year], 67 y.o.   MRN: [id number]  Laurie [name]  Date of visit:  02/01/2013 DOB:  Jul 01, 1946    Age:  67 yrs. Medical record number:  [id number]     Account number:  [id number] Primary Care Provider: Eleonore Chiquito [name] ____________________________ CURRENT DIAGNOSES  1. CAD,Native  2. Sleep apnea  3. Surgery-Aortocoronary Bypass Grafting  4. Hypertensive Heart Disease-Benign without CHF  5. Hyperlipidemia  6. Pacemaker/cardiac insitu  7. Obesity(BMI30-40)  8. Diabetes Mellitus-IDD/circ dis/uncontroll ____________________________ ALLERGIES  Lipitor, Intolerance-myalgia ____________________________ MEDICATIONS  1. Calcium Antacid Ultra Max St 400 mg (1,000 mg) tablet, chewable, 1 p.o. q.d.  2. Omega-3 350-235-90-640 mg capsule,delayed release(DR/EC), BID  3. ketoconazole 2 % Cream, BID  4. Probiotic 10 billion cell Capsule, PRN  5. aspirin 81 mg Tablet, Chewable, 1 p.o. q.d.  6. lisinopril 40 mg Tablet, 1 p.o. daily  7. metoprolol succinate 50 mg Tablet Extended Release 24 hr, 1 p.o. daily  8. chlorthalidone 25 mg Tablet, 1 p.o. q.d.  9. multivitamin Tablet, 1 p.o. q.d.  10. Vicodin 5-300 mg tablet, PRN  11. Claritin 10 mg tablet, PRN  12. Klor-Con M20 20 mEq tablet,ER particles/crystals, bid  13. Humalog KwikPen 100 unit/mL insulin pen, 140u qam  190u lunch  290u dinner  14. Lantus Solostar 100 unit/mL (3 mL) insulin pen, 60u qpm  15. Hyaluronic Acid (chond) 40-80-400 mg capsule, 1 p.o. daily  16. Zetia 10 mg tablet, 1 p.o. daily  17. Cinnamon 500 mg capsule, 1 p.o. daily  18. Vitamin B-12 2,500 mcg tablet, sublingual, 1 p.o. daily  19. omeprazole 20 mg capsule,delayed release(DR/EC), PRN  20. furosemide 40 mg tablet, 1 p.o. daily ____________________________ CHIEF COMPLAINTS  Followup of CAD,Native  Followup of Hypertensive Heart Disease-Benign without CHF ____________________________ HISTORY OF PRESENT ILLNESS  Patient seen  for cardiac followup. Since she was previously here she has had some peripheral edema. She has slowly gotten over her permanent pacemaker insertion but has gained additional weight since she was here. She has had a lot of peripheral edema noted.  She is not really having angina and denies shortness of breath, PND, or orthopnea.  ____________________________ PAST HISTORY  Past Medical Illnesses:  hypertension, DM-non-insulin dependent, hyperlipidemia, obesity, sleep apnea, carpal tunnel syndrome, plantar fasicitis, Bell'spalsy, peripheral neuropathy, thyroid nodules, lumbar disc disease;  Cardiovascular Illnesses:  CAD;  Surgical Procedures:  CABG w LIMA to LAD, SVG to dx, SVG to OM, SVG to PDA 07/22/99 Dr. Laneta Simmers, tubal ligation, cyst removed right finger, surgery for heel spur, carpal tunnel release;  Cardiology Procedures-Invasive:  cardiac cath (left) February 2014, [employer]tr[employer] pacemaker implant February 2014;  Cardiology Procedures-Noninvasive:  echocardiogram June 2011, lexiscan cardiolite August 2012, echocardiogram February 2014;  Cardiac Cath Results:  70% stenosis distal Left main, occluded LAD CFX RCA, widely patent Diag 1 OM 1 PDA SVG, widely patent LAD LIMA graft;  LVEF of 60% documented via cardiac cath on 10/23/2012,   ____________________________ CARDIO-PULMONARY TEST DATES EKG Date:  03/02/2011;   Cardiac Cath Date:  10/23/2012;  CABG: 07/22/1999;  Nuclear Study Date:  04/22/2011;  Echocardiography Date: 03/04/2010;  Chest Xray Date: 10/27/2012;   ____________________________ SOCIAL HISTORY Alcohol Use:  no alcohol use;  Smoking:  never smoked;  Diet:  regular diet;  Lifestyle:  married;  Exercise:  exercises regularly and treadmill;  Occupation:  homemaker;  Residence:  lives with husband and son still at home and daughter at home;  Spouse's Occupation:  works for  railroad ____________________________ REVIEW OF SYSTEMS General:  obesity, weight gain of approximately 15 lbs Eyes: wears eye  glasses/contact lenses, no diabetic retinopathy Ears, Nose, Throat, Mouth:  seasonal sinusitis Respiratory: dyspnea with exertion Cardiovascular:  please review HPI Abdominal: dyspepsia Musculoskeletal:  arthritis of the left foot, nocturnal cramps, chronic low back pain, arthritis of the hips knees Neurological:  unsteady gait Endocrine: neuropathy  ____________________________ PHYSICAL EXAMINATION VITAL SIGNS  Blood Pressure:  130/70 Sitting, Right arm, regular cuff  , 136/74 Standing, Right arm and regular cuff   Pulse:  88/min. Weight:  208.00 lbs. Height:  64"BMI: 35  Constitutional:  pleasant white female, in no acute distress Skin:  warm and dry to touch, no apparent skin lesions, or masses noted. Head:  normocephalic, normal hair pattern, no masses or tenderness ENT:  ears, nose and throat reveal no gross abnormalities.  Dentition good. Neck:  supple, no masses, thyromegaly, JVD. Carotid pulses are full and equal bilaterally without bruits. Chest:  clear to auscultation and percussion, healed median sternotomy scar, healed pacemaker incision in the left pectoral area Cardiac:  regular rhythm, normal S1 and S2, no S3 or S4, grade 1/6 systolic murmur heard best at the base Peripheral Pulses:  the femoral,dorsalis pedis, and posterior tibial pulses are full and equal bilaterally with no bruits auscultated. Extremities & Back:  well healed saphenous vein donor site RLE, 2+ edema ____________________________ MOST RECENT LIPID PANEL 10/23/12  CHOL TOTL 188 mg/dl, LDL 161 calc, HDL 37 mg/dl and TRIGLYCER 096 mg/dl ____________________________ IMPRESSIONS/PLAN  1. Coronary artery disease with previous bypass grafting with long-term patency of all of her grafts 2. Prior pacemaker implantation for high degree AV block 3. Hypertensive heart disease 4. Diabetes mellitus with insulin resistance 5. Hyperipidemia with statin intolerance  Recommendations:  Add furosemide 40 mg per regimen for  control of edema. Followup in 3 months. Call if problems. ____________________________ TODAYS ORDERS  1. Basic Metabolic Panel: Today  2. BNP: Today  3. Return Visit: 3 months                       ____________________________ Cardiology Physician:  [name] MD [facility]

## 2013-02-02 DIAGNOSIS — Z951 Presence of aortocoronary bypass graft: Secondary | ICD-10-CM | POA: Insufficient documentation

## 2013-02-27 ENCOUNTER — Ambulatory Visit (INDEPENDENT_AMBULATORY_CARE_PROVIDER_SITE_OTHER): Payer: Medicare HMO | Admitting: Endocrinology

## 2013-02-27 ENCOUNTER — Encounter: Payer: Self-pay | Admitting: Endocrinology

## 2013-02-27 VITALS — BP 130/74 | HR 80 | Wt 209.0 lb

## 2013-02-27 DIAGNOSIS — IMO0002 Reserved for concepts with insufficient information to code with codable children: Secondary | ICD-10-CM

## 2013-02-27 DIAGNOSIS — E1165 Type 2 diabetes mellitus with hyperglycemia: Secondary | ICD-10-CM

## 2013-02-27 NOTE — Patient Instructions (Addendum)
check your blood sugar 4 times a day--before the 3 meals, and at bedtime.  also check if you have symptoms of your blood sugar being too high or too low.  please keep a record of the readings and bring it to your next appointment here.  please call us sooner if you are having low blood sugar episodes.   Please come back for a follow-up appointment in 3 months.  blood tests are being requested for you today.  We'll contact you with results.

## 2013-02-27 NOTE — Progress Notes (Signed)
Subjective:    Patient ID: Laurie [name], female    DOB: 08/01/1946, 67 y.o.   MRN: [id number]  HPI Pt returns for f/u of insulin-requiring DM (dx'ed 1995; she has moderate neuropathy of the lower extremities; he has associated CAD; characterized by severe insulin resistance; she has never had severe hypoglycemia). she required very little insulin during a hospitalization in early 2014.  She has mild hypoglycemia 1-2 x/month, usually in the afternoon.  It is highest at hs.  she brings a record of her cbg's which i have reviewed today.  It varies from 120-250.  There is no trend throughout the day.   Past Medical History  Diagnosis Date  . Bell's palsy 01/05/2010  . GOITER, MULTINODULAR 05/14/2010  . HEPATITIS 12/06/2007  . Thyromegaly     mild  . 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 Hyperlipidemia 04/18/2012    Intolerance to several statins   . Hypertensive heart disease without CHF   . HIstory of Bell's palsy     October 2012   . Obesity (BMI 30-39.9)   . Peripheral neuropathy   . Sleep apnea   . CAD (coronary artery disease)     Cath  07/21/1999  mild ostial, 80% stenosis proximal LAD, 90% stenosis proximal Diag 1, 95% stenosis proximal OM 1, 80% stenosis proximal RCA, 95% stenosis mid RCA    CABG 07/22/99  LIMA to LAD, SVG to dx, SVG to OM, SVG to PDA  Dr. Tarri Abernethy 2012 no ischemia EF 67%   . HIstory of Bell's palsy     October 2012     Past Surgical History  Procedure Laterality Date  . Coronary artery bypass graft  2000  . Tubal ligation    . Biopsy thyroid  05/2010    percutaneous  . Foot surgery    . Carpal tunnel release    . Dental surgery      History   Social History  . Marital Status: Married    Spouse Name: N/A    Number of Children: 2  . Years of Education: N/A   Occupational History  .     Social History Main Topics  . Smoking status: Never Smoker   . Smokeless tobacco: Never Used  .  Alcohol Use: No  . Drug Use: No  . Sexually Active: No   Other Topics Concern  . Not on file   Social History Narrative   Does not work outside the home    Current Outpatient Prescriptions on File Prior to Visit  Medication Sig Dispense Refill  . AMBULATORY NON FORMULARY MEDICATION Diabetic Nutrition Pack with Cinannon, Chromium and Vitamin D3 1500 IU      . AMBULATORY NON FORMULARY MEDICATION Omega Q Plus  Once daily      . AMBULATORY NON FORMULARY MEDICATION Per Q Sorb      . aspirin EC 81 MG EC tablet Take 1 tablet (81 mg total) by mouth daily.      [name] b complex vitamins tablet Take 1 tablet by mouth daily.      . B-D ULTRAFINE III SHORT PEN 31G X 8 MM MISC       . beta carotene w/minerals (OCUVITE) tablet Take 1 tablet by mouth daily.      . chlorthalidone (HYGROTON) 25 MG tablet TAKE ONE TABLET BY MOUTH EVERY DAY  90 tablet  1  . Cholecalciferol (VITAMIN D3) 50000 UNITS CAPS Take  1 tablet by mouth daily.      [name] ezetimibe (ZETIA) 10 MG tablet Take 1 tablet (10 mg total) by mouth daily.  30 tablet  12  . Glucosamine-Chondroitin (MOVE FREE PO) Take 1 capsule by mouth daily.      [name] ketoconazole (NIZORAL) 2 % cream Apply 1 application topically daily as needed (for face rash).      [name] lisinopril (PRINIVIL,ZESTRIL) 40 MG tablet TAKE ONE TABLET BY MOUTH DAILY  90 tablet  1  . loratadine (CLARITIN) 10 MG tablet Take 10 mg by mouth daily.      . metoprolol succinate (TOPROL-XL) 50 MG 24 hr tablet TAKE ONE TABLET BY MOUTH EVERY DAY WITH OR IMMEDIATELY FOLLOWING A MEAL  90 tablet  1  . Multiple Vitamin (MULTIVITAMIN) tablet Take 1 tablet by mouth daily.        [name] omeprazole (PRILOSEC) 20 MG capsule Take 1 capsule (20 mg total) by mouth daily as needed (acid flux).  30 capsule  12  . potassium chloride SA (KLOR-CON M20) 20 MEQ tablet Take 2 tablets (40 mEq total) by mouth 2 (two) times daily.  360 tablet  1  . Probiotic Product (PROBIOTIC DAILY) CAPS Take 1 capsule by mouth daily.      [name]  PRODIGY NO CODING BLOOD GLUC test strip       . sodium chloride (OCEAN) 0.65 % nasal spray Place 1 spray into the nose as needed for congestion.      . insulin glargine (LANTUS) 100 UNIT/ML injection Inject 30 Units into the skin at bedtime.       [name] omeprazole (PRILOSEC) 20 MG capsule Take 20 mg by mouth daily as needed (acid flux).       No current facility-administered medications on file prior to visit.    Allergies  Allergen Reactions  . Statins     Intolerance to several  . Tricor (Fenofibrate)     Leg pain    Family History  Problem Relation Age of Onset  . Diabetes Father   . Heart disease Father   . Diabetes Other     Siblings and 2 children  . Heart disease Other     CAD  . Diabetes Mother     BP 130/74  Pulse 80  Wt 209 lb (94.802 kg)  BMI 35.86 kg/m2  SpO2 98%    Review of Systems Denies LOC and weight change.    Objective:   Physical Exam VITAL SIGNS:  See vs page GENERAL: no distress   Lab Results  Component Value Date   HGBA1C 8.3* 02/27/2013      Assessment & Plan:  DM: This insulin regimen was chosen from multiple options, as it best matches his insulin to his changing requirements throughout the day.  The benefits of glycemic control must be weighed against the risks of hypoglycemia.  Her insulin requirement is increasing since jan, 2014 hospitalization.

## 2013-03-27 ENCOUNTER — Encounter: Payer: Self-pay | Admitting: Internal Medicine

## 2013-03-27 ENCOUNTER — Ambulatory Visit (INDEPENDENT_AMBULATORY_CARE_PROVIDER_SITE_OTHER): Payer: Medicare HMO | Admitting: Internal Medicine

## 2013-03-27 VITALS — BP 150/76 | HR 95 | Temp 98.3°F | Wt 207.0 lb

## 2013-03-27 DIAGNOSIS — H9201 Otalgia, right ear: Secondary | ICD-10-CM

## 2013-03-27 DIAGNOSIS — G4730 Sleep apnea, unspecified: Secondary | ICD-10-CM

## 2013-03-27 DIAGNOSIS — R5383 Other fatigue: Secondary | ICD-10-CM

## 2013-03-27 DIAGNOSIS — E1165 Type 2 diabetes mellitus with hyperglycemia: Secondary | ICD-10-CM

## 2013-03-27 DIAGNOSIS — R6889 Other general symptoms and signs: Secondary | ICD-10-CM

## 2013-03-27 DIAGNOSIS — J029 Acute pharyngitis, unspecified: Secondary | ICD-10-CM

## 2013-03-27 DIAGNOSIS — Z951 Presence of aortocoronary bypass graft: Secondary | ICD-10-CM

## 2013-03-27 DIAGNOSIS — R5381 Other malaise: Secondary | ICD-10-CM

## 2013-03-27 DIAGNOSIS — H9209 Otalgia, unspecified ear: Secondary | ICD-10-CM

## 2013-03-27 DIAGNOSIS — R0989 Other specified symptoms and signs involving the circulatory and respiratory systems: Secondary | ICD-10-CM

## 2013-03-27 LAB — POCT RAPID STREP A (OFFICE): Rapid Strep A Screen: NEGATIVE

## 2013-03-27 NOTE — Progress Notes (Signed)
Chief Complaint  Patient presents with  . Headache    Started last Saturday.    . Sore Throat  . Otalgia    HPI: Patient comes in today for SDA for  new problem evaluation.  fels like throat swells since 3 days ago .    Ice pack and helps   And in tonsil hurts lright and then ear pain and ha pain on top of head.   With weakness and fatigue.  toinsol swelling Took advil x 1 ? Difference.  Hx of same sx  ROS: See pertinent positives and negatives per HPI. Had hx of same feeling in past .  Feels tired no focal weakness or numbness question hurts to swallow versus tightness no current chest pain or change in shortness of breath Had pacer 2 8   Flash in both eye   .    Dr Donnie Aho  Low grade temp.  99.   Breaking  potass in 2 . For about a month  Sugars are okay but a little bit high. Past Medical History  Diagnosis Date  . Bell's palsy 01/05/2010  . GOITER, MULTINODULAR 05/14/2010  . HEPATITIS 12/06/2007  . Thyromegaly     mild  . 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 Hyperlipidemia 04/18/2012    Intolerance to several statins   . Hypertensive heart disease without CHF   . HIstory of Bell's palsy     October 2012   . Obesity (BMI 30-39.9)   . Peripheral neuropathy   . Sleep apnea   . CAD (coronary artery disease)     Cath  07/21/1999  mild ostial, 80% stenosis proximal LAD, 90% stenosis proximal Diag 1, 95% stenosis proximal OM 1, 80% stenosis proximal RCA, 95% stenosis mid RCA    CABG 07/22/99  LIMA to LAD, SVG to dx, SVG to OM, SVG to PDA  Dr. Tarri Abernethy 2012 no ischemia EF 67%   . HIstory of Bell's palsy     October 2012     Family History  Problem Relation Age of Onset  . Diabetes Father   . Heart disease Father   . Diabetes Other     Siblings and 2 children  . Heart disease Other     CAD  . Diabetes Mother     History   Social History  . Marital Status: Married    Spouse Name: N/A    Number of  Children: 2  . Years of Education: N/A   Occupational History  .     Social History Main Topics  . Smoking status: Never Smoker   . Smokeless tobacco: Never Used  . Alcohol Use: No  . Drug Use: No  . Sexually Active: No   Other Topics Concern  . None   Social History Narrative   Does not work outside the home    Outpatient Encounter Prescriptions as of 03/27/2013  Medication Sig Dispense Refill  . AMBULATORY NON FORMULARY MEDICATION Diabetic Nutrition Pack with Cinannon, Chromium and Vitamin D3 1500 IU      . AMBULATORY NON FORMULARY MEDICATION Omega Q Plus  Once daily      . aspirin EC 81 MG EC tablet Take 1 tablet (81 mg total) by mouth daily.      [name] b complex vitamins tablet Take 1 tablet by mouth daily.      . B-D ULTRAFINE III SHORT PEN 31G X 8 MM MISC       .  beta carotene w/minerals (OCUVITE) tablet Take 1 tablet by mouth daily.      . chlorthalidone (HYGROTON) 25 MG tablet TAKE ONE TABLET BY MOUTH EVERY DAY  90 tablet  1  . Cholecalciferol (VITAMIN D3) 50000 UNITS CAPS Take 1 tablet by mouth daily.      [name] ezetimibe (ZETIA) 10 MG tablet Take 1 tablet (10 mg total) by mouth daily.  30 tablet  12  . furosemide (LASIX) 40 MG tablet Take 40 mg by mouth daily. As needed      . Glucosamine-Chondroitin (MOVE FREE PO) Take 1 capsule by mouth daily.      . insulin lispro (HUMALOG) 100 UNIT/ML injection 130 units with breakfast, 150 units with lunch, and 230 units with evening meal, and pen needles 4/day      . ketoconazole (NIZORAL) 2 % cream Apply 1 application topically daily as needed (for face rash).      [name] lisinopril (PRINIVIL,ZESTRIL) 40 MG tablet TAKE ONE TABLET BY MOUTH DAILY  90 tablet  1  . loratadine (CLARITIN) 10 MG tablet Take 10 mg by mouth daily.      . metoprolol succinate (TOPROL-XL) 50 MG 24 hr tablet TAKE ONE TABLET BY MOUTH EVERY DAY WITH OR IMMEDIATELY FOLLOWING A MEAL  90 tablet  1  . Multiple Vitamin (MULTIVITAMIN) tablet Take 1 tablet by mouth daily.         [name] omeprazole (PRILOSEC) 20 MG capsule Take 1 capsule (20 mg total) by mouth daily as needed (acid flux).  30 capsule  12  . potassium chloride SA (KLOR-CON M20) 20 MEQ tablet Take 2 tablets (40 mEq total) by mouth 2 (two) times daily.  360 tablet  1  . Probiotic Product (PROBIOTIC DAILY) CAPS Take 1 capsule by mouth daily.      [name] PRODIGY NO CODING BLOOD GLUC test strip       . sodium chloride (OCEAN) 0.65 % nasal spray Place 1 spray into the nose as needed for congestion.      . insulin glargine (LANTUS) 100 UNIT/ML injection Inject 30 Units into the skin at bedtime.       . [DISCONTINUED] AMBULATORY NON FORMULARY MEDICATION Per Q Sorb      . [DISCONTINUED] omeprazole (PRILOSEC) 20 MG capsule Take 20 mg by mouth daily as needed (acid flux).       No facility-administered encounter medications on file as of 03/27/2013.    EXAM:  BP 150/76  Pulse 95  Temp(Src) 98.3 F (36.8 C) (Oral)  Wt 207 lb (93.895 kg)  BMI 35.51 kg/m2  SpO2 97%  Body mass index is 35.51 kg/(m^2).  GENERAL: vitals reviewed and listed above, alert, oriented, appears well hydrated and in no acute distress HEENT: atraumatic, conjunctiva  clear, no obvious abnormalities on inspection of external nose and ears tms clear  OP : no lesion edema or exudate mildly red posteriorly no lesion NECK: Supple large no obvious masses on inspection palpation thyroid.  No adenopathy that is tender at the right a.c. Area; LUNGS: clear to auscultation bilaterally, no wheezes, rales or rhonchi, good air movement Well-healed mid chest scar CV: HRRR, no clubbing cyanosis or  peripheral edema nl cap refill  MS: moves all extremities without noticeable focal  Abnormality Abdomen:  Sof,t normal bowel sounds without hepatosplenomegaly, no guarding rebound or masses no CVA tenderness PSYCH: pleasant and cooperative, no obvious depression or anxiety Gait within normal limits.  ASSESSMENT AND PLAN:  Discussed the following assessment and  plan:  Sore throat right - Plan: POCT rapid strep A, Culture, Group A Strep  Other malaise and fatigue - Plan: POCT rapid strep A, Culture, Group A Strep  Throat tightness - Plan: POCT rapid strep A, Culture, Group A Strep  Otalgia, right - Probable referred pain  Insulin controlled diabetes mellitus  with complications of neuropathy and vascular disease  Sleep apnea  S/P CABG (coronary artery bypass graft) She has a number of different symptoms but feel that the acute symptoms may be viral or self-limiting. Suspect there may be reflux possibly has a history of mild thyromegaly complicated underlying disease such as diabetes and vascular disease. Fortunately her exam is stable today but would need close followup for persistent symptoms. We'll go back on her acid blocker and have her follow up with her primary physician if not getting better contact us with alarm symptoms See eye doctor. -Patient advised to return or notify health care team  if symptoms worsen or persist or new concerns arise.  Patient Instructions  Uncertain cause of your sx. But exam is reassuring  Strep rapid test is negative . Could be a throat infection  That will resolve on its own.   But will check for  For strep . Also go back on Acid blocker      Go back on the omeprezole    Every day .  Tylenol gargles in the short run . See Dr Kirtland Bouchard in 2 weeks  If any sx   Left.    See eye doc about the flashes.          Neta Mends. Sunny Aguon M.D.

## 2013-03-27 NOTE — Patient Instructions (Addendum)
Uncertain cause of your sx. But exam is reassuring  Strep rapid test is negative . Could be a throat infection  That will resolve on its own.   But will check for  For strep . Also go back on Acid blocker      Go back on the omeprezole    Every day .  Tylenol gargles in the short run . See Dr Kirtland Bouchard in 2 weeks  If any sx   Left.    See eye doc about the flashes.

## 2013-03-28 DIAGNOSIS — R6889 Other general symptoms and signs: Secondary | ICD-10-CM | POA: Insufficient documentation

## 2013-03-28 DIAGNOSIS — H9209 Otalgia, unspecified ear: Secondary | ICD-10-CM | POA: Insufficient documentation

## 2013-03-28 DIAGNOSIS — R0989 Other specified symptoms and signs involving the circulatory and respiratory systems: Secondary | ICD-10-CM | POA: Insufficient documentation

## 2013-03-28 DIAGNOSIS — J029 Acute pharyngitis, unspecified: Secondary | ICD-10-CM | POA: Insufficient documentation

## 2013-03-29 LAB — CULTURE, GROUP A STREP: Organism ID, Bacteria: NORMAL

## 2013-04-18 ENCOUNTER — Other Ambulatory Visit: Payer: Self-pay

## 2013-04-26 ENCOUNTER — Other Ambulatory Visit: Payer: Self-pay

## 2013-04-26 MED ORDER — INSULIN PEN NEEDLE 31G X 8 MM MISC: 1.0000 | Freq: Four times a day (QID) | Status: DC

## 2013-05-26 ENCOUNTER — Other Ambulatory Visit: Payer: Self-pay | Admitting: *Deleted

## 2013-05-26 MED ORDER — CHLORTHALIDONE 25 MG PO TABS: ORAL_TABLET | ORAL | Status: DC

## 2013-05-26 MED ORDER — LISINOPRIL 40 MG PO TABS: ORAL_TABLET | ORAL | Status: DC

## 2013-06-01 ENCOUNTER — Encounter: Payer: Self-pay | Admitting: Endocrinology

## 2013-06-01 ENCOUNTER — Other Ambulatory Visit (INDEPENDENT_AMBULATORY_CARE_PROVIDER_SITE_OTHER): Payer: Medicare HMO | Admitting: *Deleted

## 2013-06-01 ENCOUNTER — Ambulatory Visit (INDEPENDENT_AMBULATORY_CARE_PROVIDER_SITE_OTHER): Payer: Medicare HMO | Admitting: Endocrinology

## 2013-06-01 VITALS — BP 118/72 | HR 76 | Ht 64.0 in | Wt 206.0 lb

## 2013-06-01 DIAGNOSIS — E1165 Type 2 diabetes mellitus with hyperglycemia: Secondary | ICD-10-CM

## 2013-06-01 DIAGNOSIS — Z23 Encounter for immunization: Secondary | ICD-10-CM

## 2013-06-01 DIAGNOSIS — IMO0002 Reserved for concepts with insufficient information to code with codable children: Secondary | ICD-10-CM

## 2013-06-01 NOTE — Progress Notes (Signed)
Subjective:    Patient ID: Laurie [name], female    DOB: 24-Dec-1945, 67 y.o.   MRN: [id number]  HPI Pt returns for f/u of insulin-requiring DM (dx'ed 1995; she has moderate neuropathy of the lower extremities; he has associated CAD; characterized by severe insulin resistance; she has never had severe hypoglycemia; she required very little insulin during a hospitalization in early 2014).  She has mild hypoglycemia only once a month.  she brings a record of her cbg's which i have reviewed today.  cbg's vary from 95-300.  It is in general highest at hs, but there is little overall trend. pt states she feels well in general.   Past Medical History  Diagnosis Date  . Bell's palsy 01/05/2010  . GOITER, MULTINODULAR 05/14/2010  . HEPATITIS 12/06/2007  . Thyromegaly     mild  . 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 Hyperlipidemia 04/18/2012    Intolerance to several statins   . Hypertensive heart disease without CHF   . HIstory of Bell's palsy     October 2012   . Obesity (BMI 30-39.9)   . Peripheral neuropathy   . Sleep apnea   . CAD (coronary artery disease)     Cath  07/21/1999  mild ostial, 80% stenosis proximal LAD, 90% stenosis proximal Diag 1, 95% stenosis proximal OM 1, 80% stenosis proximal RCA, 95% stenosis mid RCA    CABG 07/22/99  LIMA to LAD, SVG to dx, SVG to OM, SVG to PDA  Dr. Tarri Abernethy 2012 no ischemia EF 67%   . HIstory of Bell's palsy     October 2012     Past Surgical History  Procedure Laterality Date  . Coronary artery bypass graft  2000  . Tubal ligation    . Biopsy thyroid  05/2010    percutaneous  . Foot surgery    . Carpal tunnel release    . Dental surgery      History   Social History  . Marital Status: Married    Spouse Name: N/A    Number of Children: 2  . Years of Education: N/A   Occupational History  .     Social History Main Topics  . Smoking status: Never Smoker   . Smokeless  tobacco: Never Used  . Alcohol Use: No  . Drug Use: No  . Sexual Activity: No   Other Topics Concern  . Not on file   Social History Narrative   Does not work outside the home    Current Outpatient Prescriptions on File Prior to Visit  Medication Sig Dispense Refill  . AMBULATORY NON FORMULARY MEDICATION Diabetic Nutrition Pack with Cinannon, Chromium and Vitamin D3 1500 IU      . AMBULATORY NON FORMULARY MEDICATION Omega Q Plus  Once daily      . aspirin EC 81 MG EC tablet Take 1 tablet (81 mg total) by mouth daily.      [name] b complex vitamins tablet Take 1 tablet by mouth daily.      . beta carotene w/minerals (OCUVITE) tablet Take 1 tablet by mouth daily.      . chlorthalidone (HYGROTON) 25 MG tablet TAKE ONE TABLET BY MOUTH EVERY DAY  90 tablet  0  . Cholecalciferol (VITAMIN D3) 50000 UNITS CAPS Take 1 tablet by mouth daily.      [name] ezetimibe (ZETIA) 10 MG tablet Take 1 tablet (10 mg total) by mouth daily.  30 tablet  12  . furosemide (LASIX) 40 MG tablet Take 40 mg by mouth daily. As needed      . Glucosamine-Chondroitin (MOVE FREE PO) Take 1 capsule by mouth daily.      . insulin lispro (HUMALOG) 100 UNIT/ML injection 130 units with breakfast, 150 units with lunch, and 230 units with evening meal, and pen needles 4/day      . Insulin Pen Needle (B-D ULTRAFINE III SHORT PEN) 31G X 8 MM MISC Inject 1 Device into the skin 4 (four) times daily.  100 each  6  . ketoconazole (NIZORAL) 2 % cream Apply 1 application topically daily as needed (for face rash).      [name] lisinopril (PRINIVIL,ZESTRIL) 40 MG tablet TAKE ONE TABLET BY MOUTH DAILY  90 tablet  0  . loratadine (CLARITIN) 10 MG tablet Take 10 mg by mouth daily.      . metoprolol succinate (TOPROL-XL) 50 MG 24 hr tablet TAKE ONE TABLET BY MOUTH EVERY DAY WITH OR IMMEDIATELY FOLLOWING A MEAL  90 tablet  1  . Multiple Vitamin (MULTIVITAMIN) tablet Take 1 tablet by mouth daily.        [name] omeprazole (PRILOSEC) 20 MG capsule Take 1 capsule  (20 mg total) by mouth daily as needed (acid flux).  30 capsule  12  . potassium chloride SA (KLOR-CON M20) 20 MEQ tablet Take 2 tablets (40 mEq total) by mouth 2 (two) times daily.  360 tablet  1  . Probiotic Product (PROBIOTIC DAILY) CAPS Take 1 capsule by mouth daily.      [name] PRODIGY NO CODING BLOOD GLUC test strip       . sodium chloride (OCEAN) 0.65 % nasal spray Place 1 spray into the nose as needed for congestion.      . insulin glargine (LANTUS) 100 UNIT/ML injection Inject 30 Units into the skin at bedtime.        No current facility-administered medications on file prior to visit.   Allergies  Allergen Reactions  . Statins     Intolerance to several  . Tricor [Fenofibrate]     Leg pain   Family History  Problem Relation Age of Onset  . Diabetes Father   . Heart disease Father   . Diabetes Other     Siblings and 2 children  . Heart disease Other     CAD  . Diabetes Mother    BP 118/72  Pulse 76  Ht 5\' 4"  (1.626 m)  Wt 206 lb (93.441 kg)  BMI 35.34 kg/m2  SpO2 98%  Review of Systems Denies LOC and weight change.    Objective:   Physical Exam VITAL SIGNS:  See vs page GENERAL: no distress SKIN:  Insulin injection sites at the anterior abdomen are normal.   Lab Results  Component Value Date   HGBA1C 8.6* 06/01/2013      Assessment & Plan:  DM: This insulin regimen was chosen from multiple options, as it best matches his insulin to his changing requirements throughout the day.  The benefits of glycemic control must be weighed against the risks of hypoglycemia.  Her insulin requirement continues to increase since jan, 2014 hospitalization. She needs increased rx. CAD: in this setting, she should avoid hypoglycemia.

## 2013-06-01 NOTE — Patient Instructions (Addendum)
check your blood sugar 4 times a day--before the 3 meals, and at bedtime.  also check if you have symptoms of your blood sugar being too high or too low.  please keep a record of the readings and bring it to your next appointment here.  please call us sooner if you are having low blood sugar episodes.   Please come back for a follow-up appointment in 3 months.  blood tests are being requested for you today.  We'll contact you with results.   pending the test results, please continue the humalog, 130 units with breakfast, 150 units with lunch, and 230 units with evening meal.  If you eat a snack at bedtime, take an extra 25 units with the snack.

## 2013-06-07 ENCOUNTER — Encounter: Payer: Self-pay | Admitting: Internal Medicine

## 2013-06-07 ENCOUNTER — Ambulatory Visit (INDEPENDENT_AMBULATORY_CARE_PROVIDER_SITE_OTHER): Payer: Medicare HMO | Admitting: Internal Medicine

## 2013-06-07 VITALS — BP 146/80 | HR 126 | Temp 98.0°F | Resp 20 | Wt 205.0 lb

## 2013-06-07 DIAGNOSIS — E042 Nontoxic multinodular goiter: Secondary | ICD-10-CM

## 2013-06-07 DIAGNOSIS — E1165 Type 2 diabetes mellitus with hyperglycemia: Secondary | ICD-10-CM

## 2013-06-07 DIAGNOSIS — Z951 Presence of aortocoronary bypass graft: Secondary | ICD-10-CM

## 2013-06-07 DIAGNOSIS — I2510 Atherosclerotic heart disease of native coronary artery without angina pectoris: Secondary | ICD-10-CM

## 2013-06-07 DIAGNOSIS — Z950 Presence of cardiac pacemaker: Secondary | ICD-10-CM

## 2013-06-07 DIAGNOSIS — R000 Tachycardia, unspecified: Secondary | ICD-10-CM

## 2013-06-07 LAB — BASIC METABOLIC PANEL
CO2: 29 mEq/L (ref 19–32)
Calcium: 10.3 mg/dL (ref 8.4–10.5)
Creatinine, Ser: 0.8 mg/dL (ref 0.4–1.2)
Glucose, Bld: 157 mg/dL — ABNORMAL HIGH (ref 70–99)

## 2013-06-07 LAB — CBC WITH DIFFERENTIAL/PLATELET
Basophils Absolute: 0 10*3/uL (ref 0.0–0.1)
Eosinophils Absolute: 0.2 10*3/uL (ref 0.0–0.7)
Lymphocytes Relative: 24.3 % (ref 12.0–46.0)
MCHC: 34.4 g/dL (ref 30.0–36.0)
Neutrophils Relative %: 63.4 % (ref 43.0–77.0)
Platelets: 252 10*3/uL (ref 150.0–400.0)
RDW: 13.2 % (ref 11.5–14.6)

## 2013-06-07 LAB — TSH: TSH: 0.81 u[IU]/mL (ref 0.35–5.50)

## 2013-06-07 NOTE — Progress Notes (Signed)
Subjective:    Patient ID: Laurie [name], female    DOB: 10/22/1945, 67 y.o.   MRN: [id number]  HPI  67 year old patient who is seen today for her biannual followup. She has a history of insulin requiring diabetes and has been seen by endocrinology recently she has also had a recent eye examination. Hemoglobin A1c 8.6. No hypoglycemia She generally feels well today. She has a history of coronary artery disease and is status post pacemaker for syncope and complete heart block in February of this year. She is followed by cardiology. When seen by endocrinology last week pulse rate was 76. She denies any shortness of breath or chest pain. TSH was normal in February She has a history of hypertension. She has been compliant with her medication including metoprolol  Past Medical History  Diagnosis Date  . Bell's palsy 01/05/2010  . GOITER, MULTINODULAR 05/14/2010  . HEPATITIS 12/06/2007  . Thyromegaly     mild  . 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 Hyperlipidemia 04/18/2012    Intolerance to several statins   . Hypertensive heart disease without CHF   . HIstory of Bell's palsy     October 2012   . Obesity (BMI 30-39.9)   . Peripheral neuropathy   . Sleep apnea   . CAD (coronary artery disease)     Cath  07/21/1999  mild ostial, 80% stenosis proximal LAD, 90% stenosis proximal Diag 1, 95% stenosis proximal OM 1, 80% stenosis proximal RCA, 95% stenosis mid RCA    CABG 07/22/99  LIMA to LAD, SVG to dx, SVG to OM, SVG to PDA  Dr. Tarri Abernethy 2012 no ischemia EF 67%   . HIstory of Bell's palsy     October 2012     History   Social History  . Marital Status: Married    Spouse Name: N/A    Number of Children: 2  . Years of Education: N/A   Occupational History  .     Social History Main Topics  . Smoking status: Never Smoker   . Smokeless tobacco: Never Used  . Alcohol Use: No  . Drug Use: No  . Sexual Activity: No    Other Topics Concern  . Not on file   Social History Narrative   Does not work outside the home    Past Surgical History  Procedure Laterality Date  . Coronary artery bypass graft  2000  . Tubal ligation    . Biopsy thyroid  05/2010    percutaneous  . Foot surgery    . Carpal tunnel release    . Dental surgery      Family History  Problem Relation Age of Onset  . Diabetes Father   . Heart disease Father   . Diabetes Other     Siblings and 2 children  . Heart disease Other     CAD  . Diabetes Mother     Allergies  Allergen Reactions  . Statins     Intolerance to several  . Tricor [Fenofibrate]     Leg pain    Current Outpatient Prescriptions on File Prior to Visit  Medication Sig Dispense Refill  . AMBULATORY NON FORMULARY MEDICATION Diabetic Nutrition Pack with Cinannon, Chromium and Vitamin D3 1500 IU      . AMBULATORY NON FORMULARY MEDICATION Omega Q Plus  Once daily      . aspirin EC 81 MG EC tablet Take 1 tablet (  81 mg total) by mouth daily.      [name] b complex vitamins tablet Take 1 tablet by mouth daily.      . beta carotene w/minerals (OCUVITE) tablet Take 1 tablet by mouth daily.      . chlorthalidone (HYGROTON) 25 MG tablet TAKE ONE TABLET BY MOUTH EVERY DAY  90 tablet  0  . Cholecalciferol (VITAMIN D3) 50000 UNITS CAPS Take 1 tablet by mouth daily.      [name] ezetimibe (ZETIA) 10 MG tablet Take 1 tablet (10 mg total) by mouth daily.  30 tablet  12  . furosemide (LASIX) 40 MG tablet Take 40 mg by mouth daily. As needed      . Glucosamine-Chondroitin (MOVE FREE PO) Take 1 capsule by mouth daily.      . insulin lispro (HUMALOG) 100 UNIT/ML injection 130 units with breakfast, 150 units with lunch, and 230 units with evening meal, and pen needles 4/day, If you eat a snack at bedtime take an extra 25 units with the snack.      . Insulin Pen Needle (B-D ULTRAFINE III SHORT PEN) 31G X 8 MM MISC Inject 1 Device into the skin 4 (four) times daily.  100 each  6  .  ketoconazole (NIZORAL) 2 % cream Apply 1 application topically daily as needed (for face rash).      [name] lisinopril (PRINIVIL,ZESTRIL) 40 MG tablet TAKE ONE TABLET BY MOUTH DAILY  90 tablet  0  . loratadine (CLARITIN) 10 MG tablet Take 10 mg by mouth daily.      . metoprolol succinate (TOPROL-XL) 50 MG 24 hr tablet TAKE ONE TABLET BY MOUTH EVERY DAY WITH OR IMMEDIATELY FOLLOWING A MEAL  90 tablet  1  . Multiple Vitamin (MULTIVITAMIN) tablet Take 1 tablet by mouth daily.        [name] omeprazole (PRILOSEC) 20 MG capsule Take 1 capsule (20 mg total) by mouth daily as needed (acid flux).  30 capsule  12  . potassium chloride SA (KLOR-CON M20) 20 MEQ tablet Take 2 tablets (40 mEq total) by mouth 2 (two) times daily.  360 tablet  1  . Probiotic Product (PROBIOTIC DAILY) CAPS Take 1 capsule by mouth daily.      [name] PRODIGY NO CODING BLOOD GLUC test strip       . sodium chloride (OCEAN) 0.65 % nasal spray Place 1 spray into the nose as needed for congestion.      . insulin glargine (LANTUS) 100 UNIT/ML injection Inject 60 Units into the skin at bedtime.        No current facility-administered medications on file prior to visit.    BP 146/80  Pulse 126  Temp(Src) 98 F (36.7 C) (Oral)  Resp 20  Wt 205 lb (92.987 kg)  BMI 35.17 kg/m2  SpO2 96%       Review of Systems  Constitutional: Negative.   HENT: Negative for hearing loss, congestion, sore throat, rhinorrhea, dental problem, sinus pressure and tinnitus.   Eyes: Negative for pain, discharge and visual disturbance.  Respiratory: Negative for cough and shortness of breath.   Cardiovascular: Negative for chest pain, palpitations and leg swelling.  Gastrointestinal: Negative for nausea, vomiting, abdominal pain, diarrhea, constipation, blood in stool and abdominal distention.  Genitourinary: Negative for dysuria, urgency, frequency, hematuria, flank pain, vaginal bleeding, vaginal discharge, difficulty urinating, vaginal pain and pelvic pain.   Musculoskeletal: Negative for joint swelling, arthralgias and gait problem.  Skin: Negative for rash.  Neurological: Negative for dizziness,  syncope, speech difficulty, weakness, numbness and headaches.  Hematological: Negative for adenopathy.  Psychiatric/Behavioral: Negative for behavioral problems, dysphoric mood and agitation. The patient is not nervous/anxious.        Objective:   Physical Exam  Constitutional: She is oriented to person, place, and time. She appears well-developed and well-nourished. No distress.  HENT:  Head: Normocephalic.  Right Ear: External ear normal.  Left Ear: External ear normal.  Mouth/Throat: Oropharynx is clear and moist.  Eyes: Conjunctivae and EOM are normal. Pupils are equal, round, and reactive to light.  Neck: Normal range of motion. Neck supple. No thyromegaly present.  Cardiovascular: Regular rhythm, normal heart sounds and intact distal pulses.   Rapid regular tachycardia  Pulmonary/Chest: Effort normal and breath sounds normal.  Abdominal: Soft. Bowel sounds are normal. She exhibits no mass. There is no tenderness.  Musculoskeletal: Normal range of motion.  Lymphadenopathy:    She has no cervical adenopathy.  Neurological: She is alert and oriented to person, place, and time.  Skin: Skin is warm and dry. No rash noted.  Psychiatric: She has a normal mood and affect. Her behavior is normal.          Assessment & Plan:   Diabetes mellitus. We'll continue the basal bolus insulin and endocrinology followup Hypertension Tachycardia status post permanent pacemaker for complete heart block. We'll review an EKG Obesity. Weight loss encouraged Hypertension stable/

## 2013-06-21 ENCOUNTER — Ambulatory Visit (INDEPENDENT_AMBULATORY_CARE_PROVIDER_SITE_OTHER): Payer: Medicare HMO | Admitting: Internal Medicine

## 2013-06-21 ENCOUNTER — Encounter: Payer: Self-pay | Admitting: Internal Medicine

## 2013-06-21 VITALS — BP 110/74 | HR 96 | Temp 98.3°F | Wt 210.0 lb

## 2013-06-21 DIAGNOSIS — Z951 Presence of aortocoronary bypass graft: Secondary | ICD-10-CM

## 2013-06-21 DIAGNOSIS — I119 Hypertensive heart disease without heart failure: Secondary | ICD-10-CM

## 2013-06-21 DIAGNOSIS — Z950 Presence of cardiac pacemaker: Secondary | ICD-10-CM

## 2013-06-21 NOTE — Patient Instructions (Signed)
Limit your sodium (Salt) intake    It is important that you exercise regularly, at least 20 minutes 3 to 4 times per week.  If you develop chest pain or shortness of breath seek  medical attention.  Return in 6 months for follow-up  

## 2013-06-21 NOTE — Progress Notes (Signed)
Subjective:    Patient ID: Laurie [name], female    DOB: [birth year], 67 y.o.   MRN: [id number]  HPI  67 year old patient who has a history of coronary artery disease status post CABG in 2000. She is status post pacemaker insertion in February of this year. She had a followup heart catheterization at that time. She was seen 2 weeks ago with a resting tachycardia of 126. EKG confirmed a sinus tachycardia. Laboratory studies were unremarkable. Today she feels well. Exam today revealed a normal blood pressure and a pulse rate of 90  Past Medical History  Diagnosis Date  . Bell's palsy 01/05/2010  . GOITER, MULTINODULAR 05/14/2010  . HEPATITIS 12/06/2007  . Thyromegaly     mild  . 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 Hyperlipidemia 04/18/2012    Intolerance to several statins   . Hypertensive heart disease without CHF   . HIstory of Bell's palsy     October 2012   . Obesity (BMI 30-39.9)   . Peripheral neuropathy   . Sleep apnea   . CAD (coronary artery disease)     Cath  07/21/1999  mild ostial, 80% stenosis proximal LAD, 90% stenosis proximal Diag 1, 95% stenosis proximal OM 1, 80% stenosis proximal RCA, 95% stenosis mid RCA    CABG 07/22/99  LIMA to LAD, SVG to dx, SVG to OM, SVG to PDA  Dr. Tarri Abernethy 2012 no ischemia EF 67%   . HIstory of Bell's palsy     October 2012     History   Social History  . Marital Status: Married    Spouse Name: N/A    Number of Children: 2  . Years of Education: N/A   Occupational History  .     Social History Main Topics  . Smoking status: Never Smoker   . Smokeless tobacco: Never Used  . Alcohol Use: No  . Drug Use: No  . Sexual Activity: No   Other Topics Concern  . Not on file   Social History Narrative   Does not work outside the home    Past Surgical History  Procedure Laterality Date  . Coronary artery bypass graft  2000  . Tubal ligation    . Biopsy thyroid   05/2010    percutaneous  . Foot surgery    . Carpal tunnel release    . Dental surgery      Family History  Problem Relation Age of Onset  . Diabetes Father   . Heart disease Father   . Diabetes Other     Siblings and 2 children  . Heart disease Other     CAD  . Diabetes Mother     Allergies  Allergen Reactions  . Statins     Intolerance to several  . Tricor [Fenofibrate]     Leg pain    Current Outpatient Prescriptions on File Prior to Visit  Medication Sig Dispense Refill  . AMBULATORY NON FORMULARY MEDICATION Diabetic Nutrition Pack with Cinannon, Chromium and Vitamin D3 1500 IU      . AMBULATORY NON FORMULARY MEDICATION Omega Q Plus  Once daily      . aspirin EC 81 MG EC tablet Take 1 tablet (81 mg total) by mouth daily.      [name] b complex vitamins tablet Take 1 tablet by mouth daily.      . beta carotene w/minerals (OCUVITE) tablet Take 1 tablet by mouth  daily.      . chlorthalidone (HYGROTON) 25 MG tablet TAKE ONE TABLET BY MOUTH EVERY DAY  90 tablet  0  . Cholecalciferol (VITAMIN D3) 50000 UNITS CAPS Take 1 tablet by mouth daily.      [name] ezetimibe (ZETIA) 10 MG tablet Take 1 tablet (10 mg total) by mouth daily.  30 tablet  12  . furosemide (LASIX) 40 MG tablet Take 40 mg by mouth daily. As needed      . Glucosamine-Chondroitin (MOVE FREE PO) Take 1 capsule by mouth daily.      . insulin lispro (HUMALOG) 100 UNIT/ML injection 130 units with breakfast, 150 units with lunch, and 230 units with evening meal, and pen needles 4/day, If you eat a snack at bedtime take an extra 25 units with the snack.      . Insulin Pen Needle (B-D ULTRAFINE III SHORT PEN) 31G X 8 MM MISC Inject 1 Device into the skin 4 (four) times daily.  100 each  6  . ketoconazole (NIZORAL) 2 % cream Apply 1 application topically daily as needed (for face rash).      [name] lisinopril (PRINIVIL,ZESTRIL) 40 MG tablet TAKE ONE TABLET BY MOUTH DAILY  90 tablet  0  . loratadine (CLARITIN) 10 MG tablet Take 10 mg  by mouth daily.      . metoprolol succinate (TOPROL-XL) 50 MG 24 hr tablet TAKE ONE TABLET BY MOUTH EVERY DAY WITH OR IMMEDIATELY FOLLOWING A MEAL  90 tablet  1  . Multiple Vitamin (MULTIVITAMIN) tablet Take 1 tablet by mouth daily.        [name] omeprazole (PRILOSEC) 20 MG capsule Take 1 capsule (20 mg total) by mouth daily as needed (acid flux).  30 capsule  12  . potassium chloride SA (KLOR-CON M20) 20 MEQ tablet Take 2 tablets (40 mEq total) by mouth 2 (two) times daily.  360 tablet  1  . Probiotic Product (PROBIOTIC DAILY) CAPS Take 1 capsule by mouth daily.      [name] PRODIGY NO CODING BLOOD GLUC test strip       . sodium chloride (OCEAN) 0.65 % nasal spray Place 1 spray into the nose as needed for congestion.      . insulin glargine (LANTUS) 100 UNIT/ML injection Inject 60 Units into the skin at bedtime.        No current facility-administered medications on file prior to visit.    BP 110/74  Pulse 96  Temp(Src) 98.3 F (36.8 C) (Oral)  Wt 210 lb (95.255 kg)  BMI 36.03 kg/m2  SpO2 94%       Review of Systems  Constitutional: Negative.   HENT: Negative for congestion, dental problem, hearing loss, rhinorrhea, sinus pressure, sore throat and tinnitus.   Eyes: Negative for pain, discharge and visual disturbance.  Respiratory: Negative for cough and shortness of breath.   Cardiovascular: Negative for chest pain, palpitations and leg swelling.  Gastrointestinal: Negative for nausea, vomiting, abdominal pain, diarrhea, constipation, blood in stool and abdominal distention.  Genitourinary: Negative for dysuria, urgency, frequency, hematuria, flank pain, vaginal bleeding, vaginal discharge, difficulty urinating, vaginal pain and pelvic pain.  Musculoskeletal: Negative for arthralgias, gait problem and joint swelling.  Skin: Negative for rash.  Neurological: Negative for dizziness, syncope, speech difficulty, weakness, numbness and headaches.  Hematological: Negative for adenopathy.   Psychiatric/Behavioral: Negative for behavioral problems, dysphoric mood and agitation. The patient is not nervous/anxious.        Objective:   Physical Exam  Constitutional:  She is oriented to person, place, and time. She appears well-developed and well-nourished. No distress.  HENT:  Head: Normocephalic.  Right Ear: External ear normal.  Left Ear: External ear normal.  Mouth/Throat: Oropharynx is clear and moist.  Eyes: Conjunctivae and EOM are normal. Pupils are equal, round, and reactive to light.  Neck: Normal range of motion. Neck supple. No thyromegaly present.  Cardiovascular: Normal rate, regular rhythm, normal heart sounds and intact distal pulses.   Pulse 90 and regular  Pulmonary/Chest: Effort normal and breath sounds normal.  Abdominal: Soft. Bowel sounds are normal. She exhibits no mass. There is no tenderness.  Musculoskeletal: Normal range of motion.  Lymphadenopathy:    She has no cervical adenopathy.  Neurological: She is alert and oriented to person, place, and time.  Skin: Skin is warm and dry. No rash noted.  Psychiatric: She has a normal mood and affect. Her behavior is normal.          Assessment & Plan:   Sinus tachycardia resolved.  (Blood sugar TSH O2 saturation and hematocrit all normal) CAD. History of heart catheterization in February 2014 Hypertension well controlled Diabetes. Follow up in endocrinology  Followup  6 months or as needed

## 2013-07-03 ENCOUNTER — Other Ambulatory Visit: Payer: Self-pay | Admitting: Endocrinology

## 2013-07-23 ENCOUNTER — Other Ambulatory Visit: Payer: Self-pay | Admitting: Internal Medicine

## 2013-08-20 ENCOUNTER — Other Ambulatory Visit: Payer: Self-pay | Admitting: Internal Medicine

## 2013-08-22 ENCOUNTER — Other Ambulatory Visit: Payer: Self-pay | Admitting: *Deleted

## 2013-08-22 MED ORDER — LISINOPRIL 40 MG PO TABS: ORAL_TABLET | ORAL | Status: DC

## 2013-08-22 MED ORDER — CHLORTHALIDONE 25 MG PO TABS: ORAL_TABLET | ORAL | Status: DC

## 2013-08-31 ENCOUNTER — Ambulatory Visit (INDEPENDENT_AMBULATORY_CARE_PROVIDER_SITE_OTHER): Payer: Medicare HMO | Admitting: Endocrinology

## 2013-08-31 ENCOUNTER — Encounter: Payer: Self-pay | Admitting: Endocrinology

## 2013-08-31 VITALS — BP 124/74 | HR 115 | Temp 98.2°F | Ht 64.0 in | Wt 210.0 lb

## 2013-08-31 DIAGNOSIS — IMO0002 Reserved for concepts with insufficient information to code with codable children: Secondary | ICD-10-CM

## 2013-08-31 DIAGNOSIS — E1165 Type 2 diabetes mellitus with hyperglycemia: Secondary | ICD-10-CM

## 2013-08-31 LAB — HEMOGLOBIN A1C: Hgb A1c MFr Bld: 8.3 % — ABNORMAL HIGH (ref 4.6–6.5)

## 2013-08-31 LAB — MICROALBUMIN / CREATININE URINE RATIO
Creatinine,U: 52.2 mg/dL
Microalb, Ur: 0.8 mg/dL (ref 0.0–1.9)

## 2013-08-31 NOTE — Patient Instructions (Addendum)
check your blood sugar 4 times a day--before the 3 meals, and at bedtime.  also check if you have symptoms of your blood sugar being too high or too low.  please keep a record of the readings and bring it to your next appointment here.  please call us sooner if you are having low blood sugar episodes.   Please come back for a follow-up appointment in 3 months.  blood tests are being requested for you today.  We'll contact you with results.   pending the test results, please continue the humalog, 130 units with breakfast, 190 units with lunch, and 290 units with evening meal.  If you eat a snack at bedtime, take an extra 25 units with the snack.   Also please continue the same lantus.

## 2013-08-31 NOTE — Progress Notes (Signed)
Subjective:    Patient ID: Laurie [name], female    DOB: Dec 29, 1945, 67 y.o.   MRN: [id number]  HPI Pt returns for f/u of insulin-requiring DM (dx'ed 1995; she has moderate neuropathy of the lower extremities; he has associated CAD; characterized by severe insulin resistance; she has never had severe hypoglycemia; she required very little insulin during a hospitalization in early 2014).  She takes humalog 3 times a day (just before each meal) 130-190-290 units.  she brings a record of her cbg's which i have reviewed today.  It varies from 90-400, but most are in the 200's.  It is in general higher as the day goes on, but this is a small trend.   Past Medical History  Diagnosis Date  . Bell's palsy 01/05/2010  . GOITER, MULTINODULAR 05/14/2010  . HEPATITIS 12/06/2007  . Thyromegaly     mild  . 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 Hyperlipidemia 04/18/2012    Intolerance to several statins   . Hypertensive heart disease without CHF   . HIstory of Bell's palsy     October 2012   . Obesity (BMI 30-39.9)   . Peripheral neuropathy   . Sleep apnea   . CAD (coronary artery disease)     Cath  07/21/1999  mild ostial, 80% stenosis proximal LAD, 90% stenosis proximal Diag 1, 95% stenosis proximal OM 1, 80% stenosis proximal RCA, 95% stenosis mid RCA    CABG 07/22/99  LIMA to LAD, SVG to dx, SVG to OM, SVG to PDA  Dr. Tarri Abernethy 2012 no ischemia EF 67%   . HIstory of Bell's palsy     October 2012     Past Surgical History  Procedure Laterality Date  . Coronary artery bypass graft  2000  . Tubal ligation    . Biopsy thyroid  05/2010    percutaneous  . Foot surgery    . Carpal tunnel release    . Dental surgery      History   Social History  . Marital Status: Married    Spouse Name: N/A    Number of Children: 2  . Years of Education: N/A   Occupational History  .     Social History Main Topics  . Smoking status:  Never Smoker   . Smokeless tobacco: Never Used  . Alcohol Use: No  . Drug Use: No  . Sexual Activity: No   Other Topics Concern  . Not on file   Social History Narrative   Does not work outside the home    Current Outpatient Prescriptions on File Prior to Visit  Medication Sig Dispense Refill  . AMBULATORY NON FORMULARY MEDICATION Diabetic Nutrition Pack with Cinannon, Chromium and Vitamin D3 1500 IU      . AMBULATORY NON FORMULARY MEDICATION Omega Q Plus  Once daily      . aspirin EC 81 MG EC tablet Take 1 tablet (81 mg total) by mouth daily.      [name] b complex vitamins tablet Take 1 tablet by mouth daily.      . B-D ULTRAFINE III SHORT PEN 31G X 8 MM MISC USE FOUR TIMES DAILY  360 each  6  . beta carotene w/minerals (OCUVITE) tablet Take 1 tablet by mouth daily.      . chlorthalidone (HYGROTON) 25 MG tablet TAKE ONE TABLET BY MOUTH EVERY DAY  90 tablet  1  . Cholecalciferol (VITAMIN D3)  50000 UNITS CAPS Take 1 tablet by mouth daily.      [name] ezetimibe (ZETIA) 10 MG tablet Take 1 tablet (10 mg total) by mouth daily.  30 tablet  12  . furosemide (LASIX) 40 MG tablet Take 40 mg by mouth daily. As needed      . Glucosamine-Chondroitin (MOVE FREE PO) Take 1 capsule by mouth daily.      . insulin lispro (HUMALOG) 100 UNIT/ML injection 130 units with breakfast, 190 units with lunch, and 290 units with evening meal, and pen needles 4/day, If you eat a snack at bedtime take an extra 25 units with the snack.      [name] ketoconazole (NIZORAL) 2 % cream APPLY TOPICALLY AS NEEDED  15 g  2  . lisinopril (PRINIVIL,ZESTRIL) 40 MG tablet TAKE ONE TABLET BY MOUTH DAILY  90 tablet  1  . loratadine (CLARITIN) 10 MG tablet Take 10 mg by mouth daily.      . metoprolol succinate (TOPROL-XL) 50 MG 24 hr tablet TAKE ONE TABLET BY MOUTH ONCE DAILY WITH OR IMMEDIATELY FOLLOWING A MEAL  90 tablet  1  . Multiple Vitamin (MULTIVITAMIN) tablet Take 1 tablet by mouth daily.        [name] omeprazole (PRILOSEC) 20 MG capsule  Take 1 capsule (20 mg total) by mouth daily as needed (acid flux).  30 capsule  12  . potassium chloride SA (KLOR-CON M20) 20 MEQ tablet Take 2 tablets (40 mEq total) by mouth 2 (two) times daily.  360 tablet  1  . Probiotic Product (PROBIOTIC DAILY) CAPS Take 1 capsule by mouth daily.      [name] PRODIGY NO CODING BLOOD GLUC test strip       . sodium chloride (OCEAN) 0.65 % nasal spray Place 1 spray into the nose as needed for congestion.      . insulin glargine (LANTUS) 100 UNIT/ML injection Inject 40 Units into the skin at bedtime.        No current facility-administered medications on file prior to visit.    Allergies  Allergen Reactions  . Statins     Intolerance to several  . Tricor [Fenofibrate]     Leg pain    Family History  Problem Relation Age of Onset  . Diabetes Father   . Heart disease Father   . Diabetes Other     Siblings and 2 children  . Heart disease Other     CAD  . Diabetes Mother     BP 124/74  Pulse 115  Temp(Src) 98.2 F (36.8 C) (Oral)  Ht 5\' 4"  (1.626 m)  Wt 210 lb (95.255 kg)  BMI 36.03 kg/m2  SpO2 94%   Review of Systems Denies hypoglycemia and weight change.      Objective:   Physical Exam VITAL SIGNS:  See vs page GENERAL: no distress   Lab Results  Component Value Date   HGBA1C 8.3* 08/31/2013      Assessment & Plan:  DM: This insulin regimen was chosen from multiple options, as it best matches his insulin to his changing requirements throughout the day.  The benefits of glycemic control must be weighed against the risks of hypoglycemia.  Her insulin requirement continues to increase since jan, 2014 hospitalization.  She needs increased rx. CAD: in this setting, she should avoid hypoglycemia. Tachycardia, ? Neuropathic.

## 2013-10-22 ENCOUNTER — Other Ambulatory Visit: Payer: Self-pay

## 2013-10-22 MED ORDER — INSULIN GLARGINE 100 UNIT/ML ~~LOC~~ SOLN: 40.0000 [IU] | Freq: Every day | SUBCUTANEOUS | Status: DC

## 2013-10-24 ENCOUNTER — Other Ambulatory Visit: Payer: Self-pay

## 2013-10-24 MED ORDER — INSULIN GLARGINE 100 UNIT/ML ~~LOC~~ SOLN: 40.0000 [IU] | Freq: Every day | SUBCUTANEOUS | Status: DC

## 2013-10-25 ENCOUNTER — Other Ambulatory Visit: Payer: Self-pay

## 2013-10-25 MED ORDER — INSULIN GLARGINE 100 UNIT/ML ~~LOC~~ SOLN: 40.0000 [IU] | Freq: Every day | SUBCUTANEOUS | Status: DC

## 2013-11-01 ENCOUNTER — Encounter: Payer: Self-pay | Admitting: Cardiology

## 2013-11-01 NOTE — Progress Notes (Signed)
Patient ID: Laurie [name], female   DOB: [birth year], 68 y.o.   MRN: [id number]   Laurie, [name]  Date of visit:  11/01/2013 DOB:  March 12, 1946    Age:  68 yrs. Medical record number:  [id number] Account number:  [id number] Primary Care Provider: Bluford Kaufmann [name] ____________________________ CURRENT DIAGNOSES  1. CAD,Native  2. Sleep apnea  3. Surgery-Aortocoronary Bypass Grafting  4. Hypertensive Heart Disease-Benign without CHF  5. Hyperlipidemia  6. Pacemaker/cardiac insitu  7. Obesity(BMI30-40)  8. Diabetes Mellitus-IDD/circ dis/uncontroll ____________________________ ALLERGIES  Atorvastatin, Muscle aches ____________________________ MEDICATIONS  1. Calcium Antacid Ultra Max St 400 mg (1,000 mg) tablet, chewable, 1 p.o. q.d.  2. Omega-3 350-235-90-640 mg capsule,delayed release(DR/EC), BID  3. ketoconazole 2 % Cream, BID  4. Probiotic 10 billion cell Capsule, PRN  5. lisinopril 40 mg Tablet, 1 p.o. daily  6. metoprolol succinate 50 mg Tablet Extended Release 24 hr, 1 p.o. daily  7. chlorthalidone 25 mg Tablet, 1 p.o. q.d.  8. multivitamin Tablet, 1 p.o. q.d.  9. Claritin 10 mg tablet, PRN  10. Klor-Con M20 20 mEq tablet,ER particles/crystals, bid  11. Zetia 10 mg tablet, 1 p.o. daily  12. Cinnamon 500 mg capsule, 1 p.o. daily  13. Vitamin B-12 2,500 mcg tablet, sublingual, 1 p.o. daily  14. omeprazole 20 mg capsule,delayed release(DR/EC), PRN  15. furosemide 40 mg tablet, 1 p.o. daily  16. Ocutabs tablet, 1 p.o. daily  17. Humalog KwikPen 100 unit/mL subcutaneous, 130 u qam  190 u lunch  290 u dinner  18. Lantus Solostar 100 unit/mL (3 mL) subcutaneous insulin pen, 60u qhs  if snacks before hs add 25 u  19. Glucosamine-Chondroitin-MSM 500 mg-416.6 mg-20 mg-0.6 mg tablet, 1 p.o. daily  20. valerian root 100 mg capsule, PRN  21. Afrin No Drip 0.05 % nasal mist, PRN  22. Tart Cherry 30 mg-250 mg-75 mg-75 mg capsule, 1 p.o. daily  23. aspirin 81 mg chewable tablet, 1  p.o. daily ____________________________ CHIEF COMPLAINTS  Followup of CAD,Native  racing pulse ____________________________ HISTORY OF PRESENT ILLNESS  Patient seen for cardiac followup. She has done well from a cardiovascular viewpoint and denies angina. She has episodes where she will develop tachycardia at times although her monitor has not shown much in the way of recurrent tachycardia. Her counters were reset today. She remains obese. She continues to have some difficulty with peripheral neuropathy and has a significantly unsteady gait. She denies PND, orthopnea or claudication. She says that her blood pressures are under good control at home. For unclear reasons she stopped taking aspirin at home recently. ____________________________ PAST HISTORY  Past Medical Illnesses:  hypertension, DM-non-insulin dependent, hyperlipidemia, obesity, sleep apnea, carpal tunnel syndrome, plantar fasicitis, Bell'spalsy, peripheral neuropathy, thyroid nodules, lumbar disc disease;  Cardiovascular Illnesses:  CAD;  Surgical Procedures:  CABG w LIMA to LAD, SVG to dx, SVG to OM, SVG to PDA 07/22/99 Dr. [name], tubal ligation, cyst removed right finger, surgery for heel spur, carpal tunnel release;  Cardiology Procedures-Invasive:  cardiac cath (left) February 2014, Medtronic pacemaker implant February 2014;  Cardiology Procedures-Noninvasive:  echocardiogram June 2011, lexiscan cardiolite August 2012, echocardiogram February 2014;  Cardiac Cath Results:  70% stenosis distal Left main, occluded LAD CFX RCA, widely patent Diag 1 OM 1 PDA SVG, widely patent LAD LIMA graft;  LVEF of 60% documented via cardiac cath on 10/23/2012,   ____________________________ CARDIO-PULMONARY TEST DATES EKG Date:  11/01/2013;   Cardiac Cath Date:  10/23/2012;  CABG: 07/22/1999;  Nuclear Study Date:  04/22/2011;  Echocardiography Date: 10/22/2012;  Chest Xray Date: 10/27/2012;   ____________________________ FAMILY HISTORY Father --  Father dead, Coronary Artery Disease Mother -- Mother dead, Diabetes mellitus Sister -- Sister alive with problem, Hypercholesterolemia Sister -- Sister alive with problem, Diabetes mellitus Sister -- Sister alive and well Sister -- Sister alive and well ____________________________ SOCIAL HISTORY Alcohol Use:  no alcohol use;  Smoking:  never smoked;  Diet:  regular diet;  Lifestyle:  married;  Exercise:  exercises regularly and treadmill;  Occupation:  homemaker;  Residence:  lives with husband and son still at home and daughter at home;  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 ____________________________ REVIEW OF SYSTEMS General:  obesity, malaise and fatigue Eyes: wears eye glasses/contact lenses, no diabetic retinopathy Respiratory: dyspnea with exertion Cardiovascular:  please review HPI Abdominal: dyspepsia Musculoskeletal:  arthritis of the left foot, nocturnal cramps, chronic low back pain, arthritis of the hips knees Neurological:  unsteady gait Endocrine: neuropathy  ____________________________ PHYSICAL EXAMINATION VITAL SIGNS  Blood Pressure:  164/100 Sitting, Left arm, regular cuff  , 170/100 Standing, Left arm and regular cuff   Pulse:  130/min. Weight:  205.00 lbs. Height:  64"BMI: 35  Constitutional:  pleasant white female, in no acute distress, moderately obese Skin:  warm and dry to touch, no apparent skin lesions, or masses noted. Head:  normocephalic, normal hair pattern, no masses or tenderness ENT:  ears, nose and throat reveal no gross abnormalities.  Dentition good. Neck:  supple, no masses, thyromegaly, JVD. Carotid pulses are full and equal bilaterally without bruits. Chest:  clear to auscultation, healed median sternotomy scar, healed pacemaker incision in the left pectoral area Cardiac:  regular rhythm, normal S1 and S2, no S3 or S4, grade 1/6 systolic murmur heard best at the base Peripheral Pulses:  pulses full and equal in all extremities Extremities &  Back:  well healed saphenous vein donor site RLE, 2+ edema ____________________________ MOST RECENT LIPID PANEL 10/23/12  CHOL TOTL 188 mg/dl, LDL 111 calc, HDL 37 mg/dl and TRIGLYCER 199 mg/dl ____________________________ IMPRESSIONS/PLAN  1. Functioning pacemaker for second-degree AV block 2. Coronary artery disease with previous bypass grafting 3. Hypertensive heart disease BP up today but controlled at home 4. Hyperlipidemia with statin intolerance but able to take Zetia 5. Episodic tachycardia but none noted on pacemaker monitoring today  Recommendations:  Pacemaker interrogated and did not show any recurrent mode switching. She has some very minor episodes noted and I will see her in followup in 6 months. EKG shows right bundle branch block and right axis deviation. ____________________________ TODAYS ORDERS  1. Return Visit: 6 months  2. 12 Lead EKG: Today                       ____________________________ Cardiology Physician:  Kerry Hough MD [facility]

## 2013-11-12 ENCOUNTER — Telehealth: Payer: Self-pay | Admitting: Internal Medicine

## 2013-11-12 NOTE — Telephone Encounter (Signed)
New Prob   Pt states she recently had her pacemaker checked. She wants to know if it is necessary for her to come to her appt on 3/5. Please call.

## 2013-11-12 NOTE — Telephone Encounter (Signed)
Informed pt that she should keep OV this Thursday. I discussed with her need/rationale to needing this f/u, explaining she had her device placed one year ago and it was routine to follow up with the doctor one year after insertion. Patient verbalized understanding and agreeable to plan.

## 2013-11-15 ENCOUNTER — Ambulatory Visit (INDEPENDENT_AMBULATORY_CARE_PROVIDER_SITE_OTHER): Payer: Commercial Managed Care - HMO | Admitting: Internal Medicine

## 2013-11-15 ENCOUNTER — Encounter: Payer: Self-pay | Admitting: Internal Medicine

## 2013-11-15 VITALS — BP 122/62 | HR 85 | Ht 64.0 in | Wt 206.4 lb

## 2013-11-15 DIAGNOSIS — I442 Atrioventricular block, complete: Secondary | ICD-10-CM

## 2013-11-15 DIAGNOSIS — Z950 Presence of cardiac pacemaker: Secondary | ICD-10-CM

## 2013-11-15 LAB — MDC_IDC_ENUM_SESS_TYPE_INCLINIC
Battery Impedance: 111 Ohm
Battery Remaining Longevity: 167 mo
Brady Statistic AP VP Percent: 0 %
Lead Channel Impedance Value: 434 Ohm
Lead Channel Impedance Value: 669 Ohm
Lead Channel Sensing Intrinsic Amplitude: 15.67 mV
Lead Channel Sensing Intrinsic Amplitude: 4 mV
Lead Channel Setting Pacing Amplitude: 2 V
Lead Channel Setting Pacing Pulse Width: 0.4 ms
Lead Channel Setting Sensing Sensitivity: 4 mV
MDC IDC MSMT BATTERY VOLTAGE: 2.8 V
MDC IDC MSMT LEADCHNL RA PACING THRESHOLD AMPLITUDE: 0.5 V
MDC IDC MSMT LEADCHNL RA PACING THRESHOLD PULSEWIDTH: 0.4 ms
MDC IDC MSMT LEADCHNL RV PACING THRESHOLD AMPLITUDE: 0.5 V
MDC IDC MSMT LEADCHNL RV PACING THRESHOLD PULSEWIDTH: 0.4 ms
MDC IDC SESS DTM: 20150305104725
MDC IDC SET LEADCHNL RV PACING AMPLITUDE: 2.5 V
MDC IDC STAT BRADY AP VS PERCENT: 2 %
MDC IDC STAT BRADY AS VP PERCENT: 1 %
MDC IDC STAT BRADY AS VS PERCENT: 97 %

## 2013-11-15 LAB — BASIC METABOLIC PANEL
BUN: 30 mg/dL — ABNORMAL HIGH (ref 6–23)
CHLORIDE: 97 meq/L (ref 96–112)
CO2: 29 mEq/L (ref 19–32)
CREATININE: 1.2 mg/dL (ref 0.4–1.2)
Calcium: 10.1 mg/dL (ref 8.4–10.5)
GFR: 49.99 mL/min — ABNORMAL LOW (ref >60.00)
Glucose, Bld: 164 mg/dL — ABNORMAL HIGH (ref 70–99)
Potassium: 3.6 mEq/L (ref 3.5–5.1)
Sodium: 136 mEq/L (ref 135–145)

## 2013-11-15 MED ORDER — FUROSEMIDE 40 MG PO TABS: 60.0000 mg | ORAL_TABLET | Freq: Every day | ORAL | Status: DC

## 2013-11-15 NOTE — Progress Notes (Signed)
Patient Care Team: Marletta Lor, MD as PCP - General Jacolyn Reedy, MD as Consulting Physician (Cardiology)   HPI  Laurie [name] is a 68 y.o. female Seen in followup for pacemaker implantation 2/14 for intermittent complete heart block and syncope.   She has had no recurrent syncope  She has some problems with peripheral edema. He's taking diuretics twice a day.  Take her beta blocker but when she didn't experience of partial withdrawal leg cramps that are quite problematic for her got better.  She's been diagnosed with sleep apnea but is not wearing her mask that she cannot tolerate it well.   She has a history of coronary artery disease with prior CABG. [place] 2/14 demonstrated patent vein graft to the diagonal OM RCA and LIMA normal left ventricular function   Past Medical History  Diagnosis Date  . Bell's palsy 01/05/2010  . GOITER, MULTINODULAR 05/14/2010  . HEPATITIS 12/06/2007  . Thyromegaly     mild  . 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 Hyperlipidemia 04/18/2012    Intolerance to several statins   . Hypertensive heart disease without CHF   . HIstory of Bell's palsy     October 2012   . Obesity (BMI 30-39.9)   . Peripheral neuropathy   . Sleep apnea   . CAD (coronary artery disease)     Cath  07/21/1999  mild ostial, 80% stenosis proximal LAD, 90% stenosis proximal Diag 1, 95% stenosis proximal OM 1, 80% stenosis proximal RCA, 95% stenosis mid RCA    CABG 07/22/99  LIMA to LAD, SVG to dx, SVG to OM, SVG to PDA  Dr. Carmin Muskrat 2012 no ischemia EF 67%   . HIstory of Bell's palsy     October 2012     Past Surgical History  Procedure Laterality Date  . Coronary artery bypass graft  2000  . Tubal ligation    . Biopsy thyroid  05/2010    percutaneous  . Foot surgery    . Carpal tunnel release    . Dental surgery      Current Outpatient Prescriptions  Medication Sig Dispense  Refill  . AMBULATORY NON FORMULARY MEDICATION Diabetic Nutrition Pack with Cinannon, Chromium and Vitamin D3 1500 IU      . AMBULATORY NON FORMULARY MEDICATION Omega Q Plus - Take 1 capsule once daily      . aspirin EC 81 MG EC tablet Take 1 tablet (81 mg total) by mouth daily.      [name] b complex vitamins tablet Take 1 tablet by mouth daily.      . beta carotene w/minerals (OCUVITE) tablet Take 1 tablet by mouth daily.      . chlorthalidone (HYGROTON) 25 MG tablet TAKE ONE TABLET BY MOUTH EVERY DAY  90 tablet  1  . Cholecalciferol (VITAMIN D3) 50000 UNITS CAPS Take 1 tablet by mouth daily.      [name] ezetimibe (ZETIA) 10 MG tablet Take 1 tablet (10 mg total) by mouth daily.  30 tablet  12  . furosemide (LASIX) 40 MG tablet Take 40 mg by mouth daily. As needed      . Glucosamine-Chondroitin (MOVE FREE PO) Take 1 capsule by mouth daily.      . insulin glargine (LANTUS) 100 UNIT/ML injection Inject 60 Units into the skin at bedtime.      . insulin lispro (HUMALOG) 100 UNIT/ML injection 130 units  with breakfast, 190 units with lunch, and 290 units with evening meal, and pen needles 4/day, If you eat a snack at bedtime take an extra 25 units with the snack.      [name] ketoconazole (NIZORAL) 2 % cream APPLY TOPICALLY AS NEEDED  15 g  2  . lisinopril (PRINIVIL,ZESTRIL) 40 MG tablet TAKE ONE TABLET BY MOUTH DAILY  90 tablet  1  . loratadine (CLARITIN) 10 MG tablet Take 10 mg by mouth daily.      . metoprolol succinate (TOPROL-XL) 50 MG 24 hr tablet TAKE ONE TABLET BY MOUTH ONCE DAILY WITH OR IMMEDIATELY FOLLOWING A MEAL  90 tablet  1  . Multiple Vitamin (MULTIVITAMIN) tablet Take 1 tablet by mouth daily.        [name] omeprazole (PRILOSEC) 20 MG capsule Take 1 capsule (20 mg total) by mouth daily as needed (acid flux).  30 capsule  12  . potassium chloride SA (KLOR-CON M20) 20 MEQ tablet Take 2 tablets (40 mEq total) by mouth 2 (two) times daily.  360 tablet  1  . Probiotic Product (PROBIOTIC DAILY) CAPS Take 1 capsule  by mouth daily.      . sodium chloride (OCEAN) 0.65 % nasal spray Place 1 spray into the nose as needed for congestion.       No current facility-administered medications for this visit.    Allergies  Allergen Reactions  . Statins     Intolerance to several  . Tricor [Fenofibrate]     Leg pain    Review of Systems negative except from HPI and PMH  Physical Exam BP 122/62  Pulse 85  Ht 5\' 4"  (1.626 m)  Wt 206 lb 6.4 oz (93.622 kg)  BMI 35.41 kg/m2 Well developed and well nourished in no acute distress HENT normal E scleral and icterus clear Neck Supple JVP flat; carotids brisk and full Clear to ausculation  Device pocket well healed; without hematoma or erythema.  There is no tetheringRegular rate and rhythm, no murmurs gallops or rub Soft with active bowel sounds No clubbing cyanosis Trace Edema Alert and oriented, grossly normal motor and sensory function Skin Warm and Dry   ECG demonstrates sinus rhythm at 85 Intervals 15/15/43 Right bundle branch block left posterior fascicular block  Assessment and  Plan  Intermittent complete heart block  Pacemaker-Medtronic The patient's device was interrogated.  The information was reviewed. No changes were made in the programming.     Sinus tachycardia  Hypertension  Coronary artery disease status post CABG previously normal LV function   Obstructive sleep apnea not on therapy   The patient's blood pressure is reasonably controlled. Her heart rate is fast. She is not tolerating of beta blockers, or think it is causing leg raise. We will give her alternative prescriptions for atenolol 50, metoprolol tartrate 50, and bisoprolol 5. She will let us know when she she tolerates better.  I've encouraged her to follow up with Dr. Raliegh Ip. for consideration of referral to pulmonary to see if something can be done to help with tolerating CPAP therapy for sleep apnea.

## 2013-11-15 NOTE — Patient Instructions (Signed)
Your physician recommends that you have lab work today: BMET  Your physician has recommended you make the following change in your medication:  1) Stop Chlorthalidone 2) Increase Lasix to 60 mg daily  Handwritten prescriptions given to you -- DO NOT TAKE THESE MEDICATIONS AT THE SAME TIME -- ONLY TRY ONE MEDICATION AT A TIME 1) start with Atenolol, if no improvement stop Atenolol and  2) start Metoprolol Tartrate 50mg  twice daily, if no improvement stop Metoprolol and 3) start Bisoprolol at 5mg  daily  Your physician recommends that you schedule a follow-up appointment in: 3 months with Dr. [name].

## 2013-11-22 ENCOUNTER — Encounter: Payer: Self-pay | Admitting: Internal Medicine

## 2013-12-05 ENCOUNTER — Ambulatory Visit (INDEPENDENT_AMBULATORY_CARE_PROVIDER_SITE_OTHER): Payer: Medicare HMO | Admitting: Endocrinology

## 2013-12-05 ENCOUNTER — Encounter: Payer: Self-pay | Admitting: Endocrinology

## 2013-12-05 VITALS — BP 128/62 | HR 105 | Temp 98.6°F | Ht 64.0 in | Wt 208.0 lb

## 2013-12-05 DIAGNOSIS — E1165 Type 2 diabetes mellitus with hyperglycemia: Secondary | ICD-10-CM

## 2013-12-05 DIAGNOSIS — E118 Type 2 diabetes mellitus with unspecified complications: Principal | ICD-10-CM

## 2013-12-05 DIAGNOSIS — IMO0002 Reserved for concepts with insufficient information to code with codable children: Secondary | ICD-10-CM

## 2013-12-05 LAB — HEMOGLOBIN A1C: Hgb A1c MFr Bld: 8.4 % — ABNORMAL HIGH (ref 4.6–6.5)

## 2013-12-05 NOTE — Patient Instructions (Addendum)
check your blood sugar 4 times a day--before the 3 meals, and at bedtime.  also check if you have symptoms of your blood sugar being too high or too low.  please keep a record of the readings and bring it to your next appointment here.  please call us sooner if you are having low blood sugar episodes.   Please come back for a follow-up appointment in 3 months.  blood tests are being requested for you today.  We'll contact you with results.   pending the test results, please take the humalog, 130 units with breakfast, 210 units with lunch, and 320 units with evening meal.  If you eat a snack at bedtime, take an extra 25 units with the snack.   Also please reduce the lantus to 40 units at bedtime.   Please call [phone], for a follow-up appointment with the sleep apnea doctor.

## 2013-12-05 NOTE — Progress Notes (Signed)
Subjective:    Patient ID: Laurie [name], female    DOB: 1946-02-04, 68 y.o.   MRN: [id number]  HPI Pt returns for f/u of insulin-requiring DM (dx'ed 1995; she has moderate neuropathy of the lower extremities; he has associated CAD; characterized by severe insulin resistance; she has never had severe hypoglycemia; she required very little insulin during a hospitalization in early 2014).  She takes humalog 3 times a day (just before each meal) 130-190-290 units.  she brings a record of her cbg's which i have reviewed today.  It varies from 90-400, but most are in the 200's.  It is in general higher as the day goes on, but this is a small trend.   Past Medical History  Diagnosis Date  . Bell's palsy 01/05/2010  . GOITER, MULTINODULAR 05/14/2010  . HEPATITIS 12/06/2007  . Thyromegaly     mild  . 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 Hyperlipidemia 04/18/2012    Intolerance to several statins   . Hypertensive heart disease without CHF   . HIstory of Bell's palsy     October 2012   . Obesity (BMI 30-39.9)   . Peripheral neuropathy   . Sleep apnea   . CAD (coronary artery disease)     Cath  07/21/1999  mild ostial, 80% stenosis proximal LAD, 90% stenosis proximal Diag 1, 95% stenosis proximal OM 1, 80% stenosis proximal RCA, 95% stenosis mid RCA    CABG 07/22/99  LIMA to LAD, SVG to dx, SVG to OM, SVG to PDA  Dr. Carmin Muskrat 2012 no ischemia EF 67%   . HIstory of Bell's palsy     October 2012     Past Surgical History  Procedure Laterality Date  . Coronary artery bypass graft  2000  . Tubal ligation    . Biopsy thyroid  05/2010    percutaneous  . Foot surgery    . Carpal tunnel release    . Dental surgery      History   Social History  . Marital Status: Married    Spouse Name: N/A    Number of Children: 2  . Years of Education: N/A   Occupational History  .     Social History Main Topics  . Smoking status:  Never Smoker   . Smokeless tobacco: Never Used  . Alcohol Use: No  . Drug Use: No  . Sexual Activity: No   Other Topics Concern  . Not on file   Social History Narrative   Does not work outside the home    Current Outpatient Prescriptions on File Prior to Visit  Medication Sig Dispense Refill  . AMBULATORY NON FORMULARY MEDICATION Diabetic Nutrition Pack with Cinannon, Chromium and Vitamin D3 1500 IU      . AMBULATORY NON FORMULARY MEDICATION Omega Q Plus - Take 1 capsule once daily      . aspirin EC 81 MG EC tablet Take 1 tablet (81 mg total) by mouth daily.      [name] b complex vitamins tablet Take 1 tablet by mouth daily.      . beta carotene w/minerals (OCUVITE) tablet Take 1 tablet by mouth daily.      . Cholecalciferol (VITAMIN D3) 50000 UNITS CAPS Take 1 tablet by mouth daily.      [name] ezetimibe (ZETIA) 10 MG tablet Take 1 tablet (10 mg total) by mouth daily.  30 tablet  12  . furosemide (  LASIX) 40 MG tablet Take 1.5 tablets (60 mg total) by mouth daily. As needed  45 tablet  3  . Glucosamine-Chondroitin (MOVE FREE PO) Take 1 capsule by mouth daily.      . insulin glargine (LANTUS) 100 UNIT/ML injection Inject 40 Units into the skin at bedtime.       . insulin lispro (HUMALOG) 100 UNIT/ML injection 130 units with breakfast, 210 units with lunch, and 320 units with evening meal, and pen needles 4/day, If you eat a snack at bedtime take an extra 25 units with the snack.      [name] ketoconazole (NIZORAL) 2 % cream APPLY TOPICALLY AS NEEDED  15 g  2  . lisinopril (PRINIVIL,ZESTRIL) 40 MG tablet TAKE ONE TABLET BY MOUTH DAILY  90 tablet  1  . loratadine (CLARITIN) 10 MG tablet Take 10 mg by mouth daily.      . Multiple Vitamin (MULTIVITAMIN) tablet Take 1 tablet by mouth daily.        . potassium chloride SA (KLOR-CON M20) 20 MEQ tablet Take 2 tablets (40 mEq total) by mouth 2 (two) times daily.  360 tablet  1  . Probiotic Product (PROBIOTIC DAILY) CAPS Take 1 capsule by mouth daily.      .  sodium chloride (OCEAN) 0.65 % nasal spray Place 1 spray into the nose as needed for congestion.      [name] omeprazole (PRILOSEC) 20 MG capsule Take 1 capsule (20 mg total) by mouth daily as needed (acid flux).  30 capsule  12   No current facility-administered medications on file prior to visit.    Allergies  Allergen Reactions  . Statins     Intolerance to several  . Tricor [Fenofibrate]     Leg pain    Family History  Problem Relation Age of Onset  . Diabetes Father   . Heart disease Father   . Diabetes Other     Siblings and 2 children  . Heart disease Other     CAD  . Diabetes Mother     BP 128/62  Pulse 105  Temp(Src) 98.6 F (37 C) (Oral)  Ht 5\' 4"  (1.626 m)  Wt 208 lb (94.348 kg)  BMI 35.69 kg/m2  SpO2 92%   Review of Systems She denies hypoglycemia and weight change.    Objective:   Physical Exam VITAL SIGNS:  See vs page GENERAL: no distress   Lab Results  Component Value Date   HGBA1C 8.4* 12/05/2013      Assessment & Plan:  DM: This insulin regimen was chosen from multiple options, as it best matches his insulin to his changing requirements throughout the day.  The benefits of glycemic control must be weighed against the risks of hypoglycemia. She needs increased rx. CAD: in this setting, she should avoid hypoglycemia.

## 2013-12-20 ENCOUNTER — Ambulatory Visit: Payer: Medicare HMO | Admitting: Internal Medicine

## 2013-12-21 ENCOUNTER — Ambulatory Visit: Payer: Medicare HMO | Admitting: Internal Medicine

## 2013-12-27 ENCOUNTER — Telehealth: Payer: Self-pay | Admitting: Internal Medicine

## 2013-12-27 NOTE — Telephone Encounter (Signed)
Left message on voice mail  to call back

## 2013-12-27 NOTE — Telephone Encounter (Signed)
New message      Off blood pressure medication for 2-3wks except lisinopril..  It has been normally ok. However, today bp was 169/73 pulse 91.  Should she continue to stay off other medication?

## 2013-12-28 ENCOUNTER — Encounter: Payer: Self-pay | Admitting: Internal Medicine

## 2013-12-28 ENCOUNTER — Ambulatory Visit (INDEPENDENT_AMBULATORY_CARE_PROVIDER_SITE_OTHER): Payer: Medicare HMO | Admitting: Internal Medicine

## 2013-12-28 VITALS — BP 134/70 | HR 117 | Temp 98.6°F | Resp 20 | Ht 64.0 in | Wt 208.0 lb

## 2013-12-28 DIAGNOSIS — E118 Type 2 diabetes mellitus with unspecified complications: Secondary | ICD-10-CM

## 2013-12-28 DIAGNOSIS — G629 Polyneuropathy, unspecified: Secondary | ICD-10-CM

## 2013-12-28 DIAGNOSIS — IMO0002 Reserved for concepts with insufficient information to code with codable children: Secondary | ICD-10-CM

## 2013-12-28 DIAGNOSIS — I119 Hypertensive heart disease without heart failure: Secondary | ICD-10-CM

## 2013-12-28 DIAGNOSIS — Z951 Presence of aortocoronary bypass graft: Secondary | ICD-10-CM

## 2013-12-28 DIAGNOSIS — E1165 Type 2 diabetes mellitus with hyperglycemia: Secondary | ICD-10-CM

## 2013-12-28 DIAGNOSIS — G609 Hereditary and idiopathic neuropathy, unspecified: Secondary | ICD-10-CM

## 2013-12-28 MED ORDER — BISOPROLOL FUMARATE 5 MG PO TABS: ORAL_TABLET | ORAL | Status: DC

## 2013-12-28 NOTE — Progress Notes (Signed)
Subjective:    Patient ID: Laurie [name], female    DOB: 1946/05/19, 68 y.o.   MRN: [id number]  HPI  68 year old patient who is followed by cardiology with CAD.  She has a history of insulin requiring diabetes and now is also followed by endocrinology. Presently, she is off beta blocker therapy.  Cardiology initially prescribed atenolol and then metoprolol.  Patient states that both drugs cause increase in peripheral edema, as well as profound weakness.  More recently, she was challenged on bisoprolol 5 mg daily.  The patient states that she discontinued due to flank and abdominal pain.  She apparently had no weakness, or edema. Today, the patient is noted to have a significant resting tachycardia  Past Medical History  Diagnosis Date  . Bell's palsy 01/05/2010  . GOITER, MULTINODULAR 05/14/2010  . HEPATITIS 12/06/2007  . Thyromegaly     mild  . 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 Hyperlipidemia 04/18/2012    Intolerance to several statins   . Hypertensive heart disease without CHF   . HIstory of Bell's palsy     October 2012   . Obesity (BMI 30-39.9)   . Peripheral neuropathy   . Sleep apnea   . CAD (coronary artery disease)     Cath  07/21/1999  mild ostial, 80% stenosis proximal LAD, 90% stenosis proximal Diag 1, 95% stenosis proximal OM 1, 80% stenosis proximal RCA, 95% stenosis mid RCA    CABG 07/22/99  LIMA to LAD, SVG to dx, SVG to OM, SVG to PDA  Dr. Carmin Muskrat 2012 no ischemia EF 67%   . HIstory of Bell's palsy     October 2012     History   Social History  . Marital Status: Married    Spouse Name: N/A    Number of Children: 2  . Years of Education: N/A   Occupational History  .     Social History Main Topics  . Smoking status: Never Smoker   . Smokeless tobacco: Never Used  . Alcohol Use: No  . Drug Use: No  . Sexual Activity: No   Other Topics Concern  . Not on file   Social History  Narrative   Does not work outside the home    Past Surgical History  Procedure Laterality Date  . Coronary artery bypass graft  2000  . Tubal ligation    . Biopsy thyroid  05/2010    percutaneous  . Foot surgery    . Carpal tunnel release    . Dental surgery      Family History  Problem Relation Age of Onset  . Diabetes Father   . Heart disease Father   . Diabetes Other     Siblings and 2 children  . Heart disease Other     CAD  . Diabetes Mother     Allergies  Allergen Reactions  . Statins     Intolerance to several  . Tricor [Fenofibrate]     Leg pain    Current Outpatient Prescriptions on File Prior to Visit  Medication Sig Dispense Refill  . AMBULATORY NON FORMULARY MEDICATION Diabetic Nutrition Pack with Cinannon, Chromium and Vitamin D3 1500 IU      . AMBULATORY NON FORMULARY MEDICATION Omega Q Plus - Take 1 capsule once daily      . aspirin EC 81 MG EC tablet Take 1 tablet (81 mg total) by mouth daily.      [name]  b complex vitamins tablet Take 1 tablet by mouth daily.      . B-D ULTRAFINE III SHORT PEN 31G X 8 MM MISC       . beta carotene w/minerals (OCUVITE) tablet Take 1 tablet by mouth daily.      . bisoprolol (ZEBETA) 5 MG tablet       . Cholecalciferol (VITAMIN D3) 50000 UNITS CAPS Take 1 tablet by mouth daily.      [name] ezetimibe (ZETIA) 10 MG tablet Take 1 tablet (10 mg total) by mouth daily.  30 tablet  12  . furosemide (LASIX) 40 MG tablet Take 1.5 tablets (60 mg total) by mouth daily. As needed  45 tablet  3  . Glucosamine-Chondroitin (MOVE FREE PO) Take 1 capsule by mouth daily.      . insulin glargine (LANTUS) 100 UNIT/ML injection Inject 40 Units into the skin at bedtime.       . insulin lispro (HUMALOG) 100 UNIT/ML injection 130 units with breakfast, 210 units with lunch, and 320 units with evening meal, and pen needles 4/day, If you eat a snack at bedtime take an extra 25 units with the snack.      [name] ketoconazole (NIZORAL) 2 % cream APPLY TOPICALLY AS  NEEDED  15 g  2  . lisinopril (PRINIVIL,ZESTRIL) 40 MG tablet TAKE ONE TABLET BY MOUTH DAILY  90 tablet  1  . loratadine (CLARITIN) 10 MG tablet Take 10 mg by mouth daily.      . Multiple Vitamin (MULTIVITAMIN) tablet Take 1 tablet by mouth daily.        . potassium chloride SA (KLOR-CON M20) 20 MEQ tablet Take 2 tablets (40 mEq total) by mouth 2 (two) times daily.  360 tablet  1  . Probiotic Product (PROBIOTIC DAILY) CAPS Take 1 capsule by mouth daily.      [name] PRODIGY NO CODING BLOOD GLUC test strip       . sodium chloride (OCEAN) 0.65 % nasal spray Place 1 spray into the nose as needed for congestion.      [name] omeprazole (PRILOSEC) 20 MG capsule Take 1 capsule (20 mg total) by mouth daily as needed (acid flux).  30 capsule  12   No current facility-administered medications on file prior to visit.    BP 134/70  Pulse 117  Temp(Src) 98.6 F (37 C) (Oral)  Resp 20  Ht 5\' 4"  (1.626 m)  Wt 208 lb (94.348 kg)  BMI 35.69 kg/m2  SpO2 95%       Review of Systems  Constitutional: Positive for fatigue.  HENT: Negative for congestion, dental problem, hearing loss, rhinorrhea, sinus pressure, sore throat and tinnitus.   Eyes: Negative for pain, discharge and visual disturbance.  Respiratory: Negative for cough and shortness of breath.   Cardiovascular: Positive for leg swelling. Negative for chest pain and palpitations.  Gastrointestinal: Positive for abdominal pain. Negative for nausea, vomiting, diarrhea, constipation, blood in stool and abdominal distention.  Genitourinary: Negative for dysuria, urgency, frequency, hematuria, flank pain, vaginal bleeding, vaginal discharge, difficulty urinating, vaginal pain and pelvic pain.  Musculoskeletal: Negative for arthralgias, gait problem and joint swelling.  Skin: Negative for rash.  Neurological: Positive for weakness. Negative for dizziness, syncope, speech difficulty, numbness and headaches.  Hematological: Negative for adenopathy.    Psychiatric/Behavioral: Negative for behavioral problems, dysphoric mood and agitation. The patient is not nervous/anxious.        Objective:   Physical Exam  Constitutional: She is oriented to person, place,  and time. She appears well-developed and well-nourished.  Blood pressure 140/70 Pulse 110  HENT:  Head: Normocephalic.  Right Ear: External ear normal.  Left Ear: External ear normal.  Mouth/Throat: Oropharynx is clear and moist.  Eyes: Conjunctivae and EOM are normal. Pupils are equal, round, and reactive to light.  Neck: Normal range of motion. Neck supple. No thyromegaly present.  Cardiovascular: Normal rate, regular rhythm, normal heart sounds and intact distal pulses.   Grade 1 over 6 brief systolic ejection murmur Resting tachycardia  Pulmonary/Chest: Effort normal and breath sounds normal.  Abdominal: Soft. Bowel sounds are normal. She exhibits no mass. There is no tenderness.  Musculoskeletal: Normal range of motion.  Trace edema  Lymphadenopathy:    She has no cervical adenopathy.  Neurological: She is alert and oriented to person, place, and time.  Skin: Skin is warm and dry. No rash noted.  Psychiatric: She has a normal mood and affect. Her behavior is normal.          Assessment & Plan:   Coronary artery disease.  Will rechallenge the patient on bisoprolol at a reduced 2 point 5 mg dose Hypertension Diabetes  Followup cardiology and endocrinology Recheck 3 months

## 2013-12-28 NOTE — Progress Notes (Signed)
Pre-visit discussion using our clinic review tool. No additional management support is needed unless otherwise documented below in the visit note.  

## 2013-12-28 NOTE — Patient Instructions (Signed)
Decrease bisoprolol  to 2.5 milligrams daily  Limit your sodium (Salt) intake    It is important that you exercise regularly, at least 20 minutes 3 to 4 times per week.  If you develop chest pain or shortness of breath seek  medical attention.

## 2014-01-10 NOTE — Telephone Encounter (Signed)
Patient returns my call. She saw Dr. Raliegh Ip on 4/17, and at that time she was taking no beta blocker secondary to side effects. Per OV she was to restart Bisoprolol at half dose (2.5 mg daily), and is currently still taking med. She c/o of bilateral leg pain/numbness, and when she is off beta blocker she is fine.  She tells me that her legs have always bothered her and that she has neuropathy, but that she feels this is r/t beta med therapy. Explained to pt that I would review this with Dr. [name] and get back with her on decision. She is agreeable to this.

## 2014-01-11 ENCOUNTER — Other Ambulatory Visit: Payer: Self-pay | Admitting: *Deleted

## 2014-01-11 NOTE — Telephone Encounter (Signed)
Advised to try either:  Verapamil CR 120 mg  Diltiazem ER 120 mg She will call pharmacy to see which one is more cost effective for her and call us back next week. We will stop Bisoprolol and start which ever med she chooses.

## 2014-01-15 ENCOUNTER — Other Ambulatory Visit: Payer: Self-pay | Admitting: *Deleted

## 2014-01-15 MED ORDER — VERAPAMIL HCL ER 120 MG PO TBCR: 120.0000 mg | EXTENDED_RELEASE_TABLET | Freq: Every day | ORAL | Status: DC

## 2014-01-15 NOTE — Telephone Encounter (Signed)
Informed pt I was sending rx for Verapamil CR 120mg  to [facility]. She will start medication tomorrow and stop Bisoprolol (today is last dose). Patient verbalized understanding and agreeable to plan.

## 2014-01-15 NOTE — Telephone Encounter (Signed)
Spoke with pharmacist at [facility] who looked into cost.  Both will cost her co-pay of $2.65 monthly.

## 2014-01-15 NOTE — Telephone Encounter (Signed)
Pt tells me that she went over the weekend to pharmacy, but they were not helpful and she was not given prices. I explained I would call her pharmacy and speak with them, and call her back with results. She is agreeable to this.

## 2014-01-15 NOTE — Telephone Encounter (Signed)
Follow up     Talk to the nurse regarding getting the cost of presc

## 2014-02-15 ENCOUNTER — Telehealth: Payer: Self-pay | Admitting: Endocrinology

## 2014-02-15 MED ORDER — INSULIN LISPRO 100 UNIT/ML ~~LOC~~ SOLN: SUBCUTANEOUS | Status: DC

## 2014-02-15 MED ORDER — INSULIN GLARGINE 100 UNIT/ML ~~LOC~~ SOLN: 40.0000 [IU] | Freq: Every day | SUBCUTANEOUS | Status: DC

## 2014-02-15 NOTE — Telephone Encounter (Signed)
Rx faxed to pharmacy  

## 2014-02-15 NOTE — Telephone Encounter (Signed)
Pt needs refill on humalog qwik pen to [facility]

## 2014-02-15 NOTE — Telephone Encounter (Signed)
Also needs refill on lantus

## 2014-02-18 ENCOUNTER — Other Ambulatory Visit: Payer: Self-pay | Admitting: Endocrinology

## 2014-02-18 MED ORDER — INSULIN GLARGINE 100 UNIT/ML ~~LOC~~ SOLN
40.0000 [IU] | Freq: Every day | SUBCUTANEOUS | Status: DC
Start: 2014-02-18 — End: 2014-02-27

## 2014-02-18 NOTE — Telephone Encounter (Signed)
Send RX to [facility] the humalog and lantus Qwik pen for both RXs 90 day supply

## 2014-02-18 NOTE — Telephone Encounter (Signed)
Rx sent. Pt needs appointment for further refills. No more refills will be given until ov.

## 2014-02-18 NOTE — Telephone Encounter (Signed)
Please call in an additional humalog qwikpen 30 day supply to [facility] because she is on her last dose today

## 2014-02-20 ENCOUNTER — Telehealth: Payer: Self-pay | Admitting: *Deleted

## 2014-02-20 ENCOUNTER — Telehealth: Payer: Self-pay

## 2014-02-20 NOTE — Telephone Encounter (Signed)
I called pt and and apologized that medication had been sent incorrectly. [facility] called [facility] and and verified that Lantus and Humalog would be dispensed as 90 supply. Pt would like to you to call her to discuss this. She was upset with how it was handled and did not want to discuss what she wanted done next with me. Messages Forward to MD via seperate telephone message.

## 2014-02-20 NOTE — Telephone Encounter (Signed)
I called pt and and apologized that medication had been sent incorrectly. [facility] and and verified that Lantus and Humalog would be dispensed as 90 supply. Pt would like to you to call her to discuss this. She was upset with how it was handled and did not want to discuss what she wanted done next with me.  Thanks!

## 2014-02-20 NOTE — Telephone Encounter (Signed)
Concerning your medication has order it 3 or 4 times the nurse put down the wrong thing very unhappy about has no medication no humalog will be shipped 02/20/14 [facility] says she will get the lantus,she need this taking care ASAP do not take the vial

## 2014-02-20 NOTE — Telephone Encounter (Signed)
Please refill both insulins prn. If she needs now, please send to local pharmacy

## 2014-02-27 ENCOUNTER — Encounter: Payer: Self-pay | Admitting: Internal Medicine

## 2014-02-27 ENCOUNTER — Other Ambulatory Visit: Payer: Self-pay | Admitting: Internal Medicine

## 2014-02-27 ENCOUNTER — Ambulatory Visit (INDEPENDENT_AMBULATORY_CARE_PROVIDER_SITE_OTHER): Payer: Commercial Managed Care - HMO | Admitting: Internal Medicine

## 2014-02-27 VITALS — BP 148/84 | HR 104 | Ht 63.0 in | Wt 208.0 lb

## 2014-02-27 DIAGNOSIS — Z950 Presence of cardiac pacemaker: Secondary | ICD-10-CM

## 2014-02-27 DIAGNOSIS — R000 Tachycardia, unspecified: Secondary | ICD-10-CM

## 2014-02-27 DIAGNOSIS — I442 Atrioventricular block, complete: Secondary | ICD-10-CM

## 2014-02-27 NOTE — Progress Notes (Signed)
Patient Care Team: Marletta Lor, MD as PCP - General Jacolyn Reedy, MD as Consulting Physician (Cardiology)   HPI  Laurie [name] is a 68 y.o. female Seen in followup for pacemaker implantation 2/14 for intermittent complete heart block and syncope. she is also noted to have some degree of sinus tachycardia with her mean heart rate right at 100 beats per minute  she has been intolerant of beta blockers and is currently on calcium blocker  She has had no recurrent syncope  She has some problems with peripheral edema this has been much improved using her diuretics.   Exercise tolerance is at baseline.  She's been diagnosed with sleep apnea but is not wearing her mask that she cannot tolerate it well.   She has a history of coronary artery disease with prior CABG. [place] 2/14 demonstrated patent vein graft to the diagonal OM RCA and LIMA normal left ventricular function   Past Medical History  Diagnosis Date  . Bell's palsy 01/05/2010  . GOITER, MULTINODULAR 05/14/2010  . HEPATITIS 12/06/2007  . Thyromegaly     mild  . 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 Hyperlipidemia 04/18/2012    Intolerance to several statins   . Hypertensive heart disease without CHF   . HIstory of Bell's palsy     October 2012   . Obesity (BMI 30-39.9)   . Peripheral neuropathy   . Sleep apnea   . CAD (coronary artery disease)     Cath  07/21/1999  mild ostial, 80% stenosis proximal LAD, 90% stenosis proximal Diag 1, 95% stenosis proximal OM 1, 80% stenosis proximal RCA, 95% stenosis mid RCA    CABG 07/22/99  LIMA to LAD, SVG to dx, SVG to OM, SVG to PDA  Dr. Carmin Muskrat 2012 no ischemia EF 67%   . HIstory of Bell's palsy     October 2012     Past Surgical History  Procedure Laterality Date  . Coronary artery bypass graft  2000  . Tubal ligation    . Biopsy thyroid  05/2010    percutaneous  . Foot surgery    . Carpal  tunnel release    . Dental surgery      Current Outpatient Prescriptions  Medication Sig Dispense Refill  . AMBULATORY NON FORMULARY MEDICATION Diabetic Nutrition Pack with Cinannon, Chromium and Vitamin D3 1500 IU      . AMBULATORY NON FORMULARY MEDICATION Omega Q Plus - Take 1 capsule once daily      . aspirin EC 81 MG EC tablet Take 1 tablet (81 mg total) by mouth daily.      [name] b complex vitamins tablet Take 1 tablet by mouth daily.      . B-D ULTRAFINE III SHORT PEN 31G X 8 MM MISC       . beta carotene w/minerals (OCUVITE) tablet Take 1 tablet by mouth daily.      . Cholecalciferol (VITAMIN D3) 50000 UNITS CAPS Take 1 tablet by mouth daily.      [name] ezetimibe (ZETIA) 10 MG tablet Take 1 tablet (10 mg total) by mouth daily.  30 tablet  12  . furosemide (LASIX) 40 MG tablet Take 1.5 tablets (60 mg total) by mouth daily. As needed  45 tablet  3  . Glucosamine-Chondroitin (MOVE FREE PO) Take 1 capsule by mouth daily.      . insulin glargine (LANTUS) 100 UNIT/ML  injection Inject 40 Units into the skin at bedtime. 25 units with snack      . insulin lispro (HUMALOG) 100 UNIT/ML injection 130 units with breakfast, 210 units with lunch, and 320 units with evening meal, and pen needles 4/day,      . ketoconazole (NIZORAL) 2 % cream APPLY TOPICALLY AS NEEDED  15 g  2  . lisinopril (PRINIVIL,ZESTRIL) 40 MG tablet TAKE ONE TABLET BY MOUTH DAILY  90 tablet  1  . loratadine (CLARITIN) 10 MG tablet Take 10 mg by mouth daily.      . Multiple Vitamin (MULTIVITAMIN) tablet Take 1 tablet by mouth daily.        . potassium chloride SA (KLOR-CON M20) 20 MEQ tablet Take 2 tablets (40 mEq total) by mouth 2 (two) times daily.  360 tablet  1  . Probiotic Product (PROBIOTIC DAILY) CAPS Take 1 capsule by mouth daily.      [name] PRODIGY NO CODING BLOOD GLUC test strip       . sodium chloride (OCEAN) 0.65 % nasal spray Place 1 spray into the nose as needed for congestion.      . verapamil (CALAN-SR) 120 MG CR tablet Take  1 tablet (120 mg total) by mouth daily.  30 tablet  3  . omeprazole (PRILOSEC) 20 MG capsule Take 1 capsule (20 mg total) by mouth daily as needed (acid flux).  30 capsule  12   No current facility-administered medications for this visit.    Allergies  Allergen Reactions  . Statins     Intolerance to several  . Tricor [Fenofibrate]     Leg pain    Review of Systems negative except from HPI and PMH  Physical Exam BP 148/84  Pulse 104  Ht 5\' 3"  (1.6 m)  Wt 208 lb (94.348 kg)  BMI 36.85 kg/m2 Well developed and well nourished in no acute distress HENT normal E scleral and icterus clear Neck Supple JVP flat; carotids brisk and full Clear to ausculation  Device pocket well healed; without hematoma or erythema.  There is no tetheringRegular rate and rhythm, no murmurs gallops or rub Soft with active bowel sounds No clubbing cyanosis Trace Edema Alert and oriented, grossly normal motor and sensory function Skin Warm and Dry   ECG demonstrates sinus rhythm at 85 Intervals 15/15/43 Right bundle branch block left posterior fascicular block  Assessment and  Plan  Intermittent complete heart block  Pacemaker-Medtronic The patient's device was interrogated.  The information was reviewed. No changes were made in the programming.     Sinus tachycardia  Hypertension  Coronary artery disease status post CABG previously normal LV function   Obstructive sleep apnea not on therapy  Exercise tolerance has been stable. Her sinus tachycardia persists. We will check an echo to make sure that there is no interval affect on LV function.  Is tolerating verapmil for blood pressure. She has no associated  Palpitations with her sinus tachycardia. It is not clear that is impacting her exercise tolerance.  We did discuss the role of  Ivabradine but its indication would be the above

## 2014-02-27 NOTE — Patient Instructions (Addendum)
Your physician recommends that you continue on your current medications as directed. Please refer to the Current Medication list given to you today.  Your physician has requested that you have an echocardiogram with Dr. Wynonia Lawman. Echocardiography is a painless test that uses sound waves to create images of your heart. It provides your doctor with information about the size and shape of your heart and how well your heart's chambers and valves are working. This procedure takes approximately one hour. There are no restrictions for this procedure.  Remote monitoring is used to monitor your pacemaker from home. This monitoring reduces the number of office visits required to check your device to one time per year. It allows [place] to keep an eye on the functioning of your device to ensure it is working properly. You are scheduled for a device check from home on 06-03-2014. You may send your transmission at any time that day. If you have a wireless device, the transmission will be sent automatically. After your Dr.Tilley reviews your transmission, you will receive a postcard with your next transmission date.  Your physician recommends that you schedule a follow-up appointment in: 6 months with Dr.Klein

## 2014-03-04 LAB — MDC_IDC_ENUM_SESS_TYPE_INCLINIC
Battery Impedance: 135 Ohm
Battery Voltage: 2.8 V
Brady Statistic AP VP Percent: 0 %
Brady Statistic AP VS Percent: 8 %
Brady Statistic AS VP Percent: 2 %
Brady Statistic AS VS Percent: 91 %
Date Time Interrogation Session: 20150617180003
Lead Channel Impedance Value: 459 Ohm
Lead Channel Impedance Value: 683 Ohm
Lead Channel Pacing Threshold Amplitude: 0.75 V
Lead Channel Pacing Threshold Pulse Width: 0.4 ms
Lead Channel Pacing Threshold Pulse Width: 0.4 ms
Lead Channel Sensing Intrinsic Amplitude: 15.67 mV
Lead Channel Sensing Intrinsic Amplitude: 4 mV
Lead Channel Setting Pacing Amplitude: 2 V
Lead Channel Setting Sensing Sensitivity: 4 mV
MDC IDC MSMT BATTERY REMAINING LONGEVITY: 159 mo
MDC IDC MSMT LEADCHNL RV PACING THRESHOLD AMPLITUDE: 0.5 V
MDC IDC SET LEADCHNL RV PACING AMPLITUDE: 2.5 V
MDC IDC SET LEADCHNL RV PACING PULSEWIDTH: 0.4 ms

## 2014-03-06 ENCOUNTER — Encounter: Payer: Self-pay | Admitting: Internal Medicine

## 2014-03-11 ENCOUNTER — Ambulatory Visit (INDEPENDENT_AMBULATORY_CARE_PROVIDER_SITE_OTHER): Payer: Medicare HMO | Admitting: Endocrinology

## 2014-03-11 ENCOUNTER — Encounter: Payer: Self-pay | Admitting: Endocrinology

## 2014-03-11 VITALS — BP 122/70 | HR 109 | Temp 98.6°F | Ht 63.0 in | Wt 207.0 lb

## 2014-03-11 DIAGNOSIS — IMO0002 Reserved for concepts with insufficient information to code with codable children: Secondary | ICD-10-CM

## 2014-03-11 DIAGNOSIS — E1165 Type 2 diabetes mellitus with hyperglycemia: Secondary | ICD-10-CM

## 2014-03-11 DIAGNOSIS — E118 Type 2 diabetes mellitus with unspecified complications: Principal | ICD-10-CM

## 2014-03-11 LAB — HEMOGLOBIN A1C: Hgb A1c MFr Bld: 8.4 % — ABNORMAL HIGH (ref 4.6–6.5)

## 2014-03-11 MED ORDER — INSULIN LISPRO 100 UNIT/ML (KWIKPEN): PEN_INJECTOR | SUBCUTANEOUS | Status: DC

## 2014-03-11 MED ORDER — INSULIN GLARGINE 100 UNIT/ML SOLOSTAR PEN: 40.0000 [IU] | PEN_INJECTOR | Freq: Every day | SUBCUTANEOUS | Status: DC

## 2014-03-11 NOTE — Patient Instructions (Addendum)
check your blood sugar 4 times a day--before the 3 meals, and at bedtime.  also check if you have symptoms of your blood sugar being too high or too low.  please keep a record of the readings and bring it to your next appointment here.  please call us sooner if you are having low blood sugar episodes.   Please come back for a follow-up appointment in 3 months.  A diabetes blood test is requested for you today.  We'll contact you with results.   pending the test results, please increase the humalog to 130 units with breakfast, 240 units with lunch, and 320 units with evening meal.  If you eat a snack at bedtime, take an extra 25 units with the snack.   Also please continue the lantus, 40 units at bedtime.

## 2014-03-11 NOTE — Progress Notes (Signed)
Subjective:    Patient ID: Laurie [name], female    DOB: 1946/08/11, 68 y.o.   MRN: [id number]  HPI Pt returns for f/u of insulin-requiring DM (dx'ed 1995; she has moderate neuropathy of the lower extremities, and associated CAD; it is characterized by severe insulin resistance; she has never had severe hypoglycemia, pancreatitis, or DKA; she required very little insulin during a hospitalization in early 2014).  she brings a record of her cbg's which i have reviewed today.  She was without humalog for a few weeks, due to a problem with refilling it.   Past Medical History  Diagnosis Date  . Bell's palsy 01/05/2010  . GOITER, MULTINODULAR 05/14/2010  . HEPATITIS 12/06/2007  . Thyromegaly     mild  . 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 Hyperlipidemia 04/18/2012    Intolerance to several statins   . Hypertensive heart disease without CHF   . HIstory of Bell's palsy     October 2012   . Obesity (BMI 30-39.9)   . Peripheral neuropathy   . Sleep apnea   . CAD (coronary artery disease)     Cath  07/21/1999  mild ostial, 80% stenosis proximal LAD, 90% stenosis proximal Diag 1, 95% stenosis proximal OM 1, 80% stenosis proximal RCA, 95% stenosis mid RCA    CABG 07/22/99  LIMA to LAD, SVG to dx, SVG to OM, SVG to PDA  Dr. Carmin Muskrat 2012 no ischemia EF 67%   . HIstory of Bell's palsy     October 2012     Past Surgical History  Procedure Laterality Date  . Coronary artery bypass graft  2000  . Tubal ligation    . Biopsy thyroid  05/2010    percutaneous  . Foot surgery    . Carpal tunnel release    . Dental surgery      History   Social History  . Marital Status: Married    Spouse Name: N/A    Number of Children: 2  . Years of Education: N/A   Occupational History  .     Social History Main Topics  . Smoking status: Never Smoker   . Smokeless tobacco: Never Used  . Alcohol Use: No  . Drug Use: No  . Sexual  Activity: No   Other Topics Concern  . Not on file   Social History Narrative   Does not work outside the home    Current Outpatient Prescriptions on File Prior to Visit  Medication Sig Dispense Refill  . AMBULATORY NON FORMULARY MEDICATION Diabetic Nutrition Pack with Cinannon, Chromium and Vitamin D3 1500 IU      . AMBULATORY NON FORMULARY MEDICATION Omega Q Plus - Take 1 capsule once daily      . aspirin EC 81 MG EC tablet Take 1 tablet (81 mg total) by mouth daily.      [name] b complex vitamins tablet Take 1 tablet by mouth daily.      . B-D ULTRAFINE III SHORT PEN 31G X 8 MM MISC       . beta carotene w/minerals (OCUVITE) tablet Take 1 tablet by mouth daily.      . Cholecalciferol (VITAMIN D3) 50000 UNITS CAPS Take 1 tablet by mouth daily.      [name] ezetimibe (ZETIA) 10 MG tablet Take 1 tablet (10 mg total) by mouth daily.  30 tablet  12  . furosemide (LASIX) 40 MG tablet TAKE  1 AND 1/2 TABLETS EVERY DAY AS NEEDED  135 tablet  1  . Glucosamine-Chondroitin (MOVE FREE PO) Take 1 capsule by mouth daily.      [name] ketoconazole (NIZORAL) 2 % cream APPLY TOPICALLY AS NEEDED  15 g  2  . lisinopril (PRINIVIL,ZESTRIL) 40 MG tablet TAKE ONE TABLET BY MOUTH DAILY  90 tablet  1  . loratadine (CLARITIN) 10 MG tablet Take 10 mg by mouth daily.      . Multiple Vitamin (MULTIVITAMIN) tablet Take 1 tablet by mouth daily.        . potassium chloride SA (KLOR-CON M20) 20 MEQ tablet Take 2 tablets (40 mEq total) by mouth 2 (two) times daily.  360 tablet  1  . Probiotic Product (PROBIOTIC DAILY) CAPS Take 1 capsule by mouth daily.      [name] PRODIGY NO CODING BLOOD GLUC test strip       . sodium chloride (OCEAN) 0.65 % nasal spray Place 1 spray into the nose as needed for congestion.      . verapamil (CALAN-SR) 120 MG CR tablet Take 1 tablet (120 mg total) by mouth daily.  30 tablet  3  . omeprazole (PRILOSEC) 20 MG capsule Take 1 capsule (20 mg total) by mouth daily as needed (acid flux).  30 capsule  12   No  current facility-administered medications on file prior to visit.    Allergies  Allergen Reactions  . Statins     Intolerance to several  . Tricor [Fenofibrate]     Leg pain    Family History  Problem Relation Age of Onset  . Diabetes Father   . Heart disease Father   . Diabetes Other     Siblings and 2 children  . Heart disease Other     CAD  . Diabetes Mother     BP 122/70  Pulse 109  Temp(Src) 98.6 F (37 C) (Oral)  Ht 5\' 3"  (1.6 m)  Wt 207 lb (93.895 kg)  BMI 36.68 kg/m2  SpO2 93%   Review of Systems She denies hypoglycemia and weight change.      Objective:   Physical Exam VITAL SIGNS:  See vs page GENERAL: no distress SKIN:  Insulin injection sites at the anterior abdomen are normal.   Lab Results  Component Value Date   HGBA1C 8.4* 03/11/2014       Assessment & Plan:  DM: moderate exacerbation CAD: in this setting, she should avoid hypoglycemia.  This impairs the ability to achieve glycemic control.  I'll work around this as best I can--we'll have to increase insulin slowly.     Patient is advised the following: Patient Instructions  check your blood sugar 4 times a day--before the 3 meals, and at bedtime.  also check if you have symptoms of your blood sugar being too high or too low.  please keep a record of the readings and bring it to your next appointment here.  please call us sooner if you are having low blood sugar episodes.   Please come back for a follow-up appointment in 3 months.  A diabetes blood test is requested for you today.  We'll contact you with results.   pending the test results, please increase the humalog to 130 units with breakfast, 240 units with lunch, and 320 units with evening meal.  If you eat a snack at bedtime, take an extra 25 units with the snack.   Also please continue the lantus, 40 units at bedtime.

## 2014-03-13 ENCOUNTER — Telehealth: Payer: Self-pay | Admitting: Internal Medicine

## 2014-03-13 MED ORDER — EZETIMIBE 10 MG PO TABS: 10.0000 mg | ORAL_TABLET | Freq: Every day | ORAL | Status: DC

## 2014-03-13 NOTE — Telephone Encounter (Signed)
Rx sent 

## 2014-03-13 NOTE — Telephone Encounter (Signed)
[facility] [place], [place] - [address] Is requesting 90 day re-fill on ezetimibe (ZETIA) 10 MG tablet

## 2014-03-29 ENCOUNTER — Ambulatory Visit (INDEPENDENT_AMBULATORY_CARE_PROVIDER_SITE_OTHER): Payer: Commercial Managed Care - HMO | Admitting: Internal Medicine

## 2014-03-29 ENCOUNTER — Encounter: Payer: Self-pay | Admitting: Internal Medicine

## 2014-03-29 VITALS — BP 110/60 | HR 100 | Temp 98.4°F | Resp 20 | Ht 63.0 in | Wt 209.0 lb

## 2014-03-29 DIAGNOSIS — E785 Hyperlipidemia, unspecified: Secondary | ICD-10-CM

## 2014-03-29 DIAGNOSIS — IMO0002 Reserved for concepts with insufficient information to code with codable children: Secondary | ICD-10-CM

## 2014-03-29 DIAGNOSIS — Z951 Presence of aortocoronary bypass graft: Secondary | ICD-10-CM

## 2014-03-29 DIAGNOSIS — E118 Type 2 diabetes mellitus with unspecified complications: Secondary | ICD-10-CM

## 2014-03-29 DIAGNOSIS — I119 Hypertensive heart disease without heart failure: Secondary | ICD-10-CM

## 2014-03-29 DIAGNOSIS — E1165 Type 2 diabetes mellitus with hyperglycemia: Secondary | ICD-10-CM

## 2014-03-29 DIAGNOSIS — E042 Nontoxic multinodular goiter: Secondary | ICD-10-CM

## 2014-03-29 NOTE — Progress Notes (Signed)
Pre visit review using our clinic review tool, if applicable. No additional management support is needed unless otherwise documented below in the visit note. 

## 2014-03-29 NOTE — Progress Notes (Signed)
Subjective:    Patient ID: Laurie [name], female    DOB: 11-21-1945, 68 y.o.   MRN: [id number]  HPI  68 year old patient who is seen today for followup.  She is followed by endocrinology for intercurrent diabetes, complicated by peripheral neuropathy.  She is scheduled for a followup eye examination soon. She is followed by cardiology to do 2 coronary artery disease.  She has a history of hypertension, goiter, and dyslipidemia.  Noted concerns or complaints  Past Medical History  Diagnosis Date  . Bell's palsy 01/05/2010  . GOITER, MULTINODULAR 05/14/2010  . HEPATITIS 12/06/2007  . Thyromegaly     mild  . 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 Hyperlipidemia 04/18/2012    Intolerance to several statins   . Hypertensive heart disease without CHF   . HIstory of Bell's palsy     October 2012   . Obesity (BMI 30-39.9)   . Peripheral neuropathy   . Sleep apnea   . CAD (coronary artery disease)     Cath  07/21/1999  mild ostial, 80% stenosis proximal LAD, 90% stenosis proximal Diag 1, 95% stenosis proximal OM 1, 80% stenosis proximal RCA, 95% stenosis mid RCA    CABG 07/22/99  LIMA to LAD, SVG to dx, SVG to OM, SVG to PDA  Dr. Carmin Muskrat 2012 no ischemia EF 67%   . HIstory of Bell's palsy     October 2012     History   Social History  . Marital Status: Married    Spouse Name: N/A    Number of Children: 2  . Years of Education: N/A   Occupational History  .     Social History Main Topics  . Smoking status: Never Smoker   . Smokeless tobacco: Never Used  . Alcohol Use: No  . Drug Use: No  . Sexual Activity: No   Other Topics Concern  . Not on file   Social History Narrative   Does not work outside the home    Past Surgical History  Procedure Laterality Date  . Coronary artery bypass graft  2000  . Tubal ligation    . Biopsy thyroid  05/2010    percutaneous  . Foot surgery    . Carpal tunnel release     . Dental surgery      Family History  Problem Relation Age of Onset  . Diabetes Father   . Heart disease Father   . Diabetes Other     Siblings and 2 children  . Heart disease Other     CAD  . Diabetes Mother     Allergies  Allergen Reactions  . Statins     Intolerance to several  . Tricor [Fenofibrate]     Leg pain    Current Outpatient Prescriptions on File Prior to Visit  Medication Sig Dispense Refill  . AMBULATORY NON FORMULARY MEDICATION Diabetic Nutrition Pack with Cinannon, Chromium and Vitamin D3 1500 IU      . AMBULATORY NON FORMULARY MEDICATION Omega Q Plus - Take 1 capsule once daily      . aspirin EC 81 MG EC tablet Take 1 tablet (81 mg total) by mouth daily.      [name] b complex vitamins tablet Take 1 tablet by mouth daily.      . B-D ULTRAFINE III SHORT PEN 31G X 8 MM MISC       . beta carotene w/minerals (OCUVITE) tablet  Take 1 tablet by mouth daily.      . Cholecalciferol (VITAMIN D3) 50000 UNITS CAPS Take 1 tablet by mouth daily.      [name] ezetimibe (ZETIA) 10 MG tablet Take 1 tablet (10 mg total) by mouth daily.  90 tablet  3  . furosemide (LASIX) 40 MG tablet TAKE 1 AND 1/2 TABLETS EVERY DAY AS NEEDED  135 tablet  1  . Glucosamine-Chondroitin (MOVE FREE PO) Take 1 capsule by mouth daily.      [name] ketoconazole (NIZORAL) 2 % cream APPLY TOPICALLY AS NEEDED  15 g  2  . lisinopril (PRINIVIL,ZESTRIL) 40 MG tablet TAKE ONE TABLET BY MOUTH DAILY  90 tablet  1  . loratadine (CLARITIN) 10 MG tablet Take 10 mg by mouth daily.      . Multiple Vitamin (MULTIVITAMIN) tablet Take 1 tablet by mouth daily.        . potassium chloride SA (KLOR-CON M20) 20 MEQ tablet Take 2 tablets (40 mEq total) by mouth 2 (two) times daily.  360 tablet  1  . Probiotic Product (PROBIOTIC DAILY) CAPS Take 1 capsule by mouth daily.      [name] PRODIGY NO CODING BLOOD GLUC test strip       . sodium chloride (OCEAN) 0.65 % nasal spray Place 1 spray into the nose as needed for congestion.      . verapamil  (CALAN-SR) 120 MG CR tablet Take 1 tablet (120 mg total) by mouth daily.  30 tablet  3  . omeprazole (PRILOSEC) 20 MG capsule Take 1 capsule (20 mg total) by mouth daily as needed (acid flux).  30 capsule  12   No current facility-administered medications on file prior to visit.    BP 110/60  Pulse 100  Temp(Src) 98.4 F (36.9 C) (Oral)  Resp 20  Ht 5\' 3"  (1.6 m)  Wt 209 lb (94.802 kg)  BMI 37.03 kg/m2  SpO2 97%     Review of Systems  Constitutional: Negative.   HENT: Negative for congestion, dental problem, hearing loss, rhinorrhea, sinus pressure, sore throat and tinnitus.   Eyes: Negative for pain, discharge and visual disturbance.  Respiratory: Negative for cough and shortness of breath.   Cardiovascular: Negative for chest pain, palpitations and leg swelling.  Gastrointestinal: Negative for nausea, vomiting, abdominal pain, diarrhea, constipation, blood in stool and abdominal distention.  Genitourinary: Negative for dysuria, urgency, frequency, hematuria, flank pain, vaginal bleeding, vaginal discharge, difficulty urinating, vaginal pain and pelvic pain.  Musculoskeletal: Negative for arthralgias, gait problem and joint swelling.  Skin: Negative for rash.  Neurological: Negative for dizziness, syncope, speech difficulty, weakness, numbness and headaches.  Hematological: Negative for adenopathy.  Psychiatric/Behavioral: Negative for behavioral problems, dysphoric mood and agitation. The patient is not nervous/anxious.        Objective:   Physical Exam  Constitutional: She is oriented to person, place, and time. She appears well-developed and well-nourished.  HENT:  Head: Normocephalic.  Right Ear: External ear normal.  Left Ear: External ear normal.  Mouth/Throat: Oropharynx is clear and moist.  Eyes: Conjunctivae and EOM are normal. Pupils are equal, round, and reactive to light.  Neck: Normal range of motion. Neck supple. No thyromegaly present.  Cardiovascular:  Normal rate, regular rhythm, normal heart sounds and intact distal pulses.   Heart rate 90  Pulmonary/Chest: Effort normal and breath sounds normal.  Abdominal: Soft. Bowel sounds are normal. She exhibits no mass. There is no tenderness.  Musculoskeletal: Normal range of motion.  Lymphadenopathy:  She has no cervical adenopathy.  Neurological: She is alert and oriented to person, place, and time.  Skin: Skin is warm and dry. No rash noted.  Psychiatric: She has a normal mood and affect. Her behavior is normal.          Assessment & Plan:  hypertension well controlled Diabetes mellitus.  Followup in endocrinology Coronary artery disease, status post CABG Dyslipidemia continue statin therapy  No change in therapy CPX in 6 months

## 2014-03-29 NOTE — Patient Instructions (Signed)
Please check your hemoglobin A1c every 3 months  Limit your sodium (Salt) intake    It is important that you exercise regularly, at least 20 minutes 3 to 4 times per week.  If you develop chest pain or shortness of breath seek  medical attention.  You need to lose weight.  Consider a lower calorie diet and regular exercise. 

## 2014-04-10 ENCOUNTER — Other Ambulatory Visit: Payer: Self-pay | Admitting: Internal Medicine

## 2014-04-11 ENCOUNTER — Telehealth: Payer: Self-pay | Admitting: Internal Medicine

## 2014-04-11 MED ORDER — POTASSIUM CHLORIDE CRYS ER 20 MEQ PO TBCR: 20.0000 meq | EXTENDED_RELEASE_TABLET | Freq: Two times a day (BID) | ORAL | Status: DC

## 2014-04-11 NOTE — Telephone Encounter (Signed)
Called pt and verified dosage of Potassium one tablet twice day.

## 2014-04-11 NOTE — Telephone Encounter (Signed)
[facility] [name], [name] - [address] Is requesting re-fill on potassium chloride SA (KLOR-CON M20) 20 MEQ tablet. They are also requesting directions for use clarification.

## 2014-04-23 ENCOUNTER — Other Ambulatory Visit: Payer: Self-pay

## 2014-04-23 MED ORDER — GLUCOSE BLOOD VI STRP: ORAL_STRIP | Status: DC

## 2014-04-26 ENCOUNTER — Telehealth: Payer: Self-pay | Admitting: Internal Medicine

## 2014-04-26 NOTE — Telephone Encounter (Signed)
Pt has [employer] ins and needs a referral to dr Wynonia Lawman. I will call dr Wynonia Lawman office on Monday and get dx. Dr Wynonia Lawman office close at 330pm on friday

## 2014-04-29 NOTE — Telephone Encounter (Signed)
Authorization done - Authorization # [id number] start- 05/02/2014 end 11-02-2013 Jari Pigg, MD   Address: [address] [place], [place], [place] [address]  [phone] [employer] silver[employer]

## 2014-04-29 NOTE — Telephone Encounter (Signed)
Type of Insurance: humana  Do we have a copy of Insurance Card on File? yes  Patient's PCP:dr Burnice Logan  PCP's IZT:[id number]  Treating Provider: dr Wynonia Lawman  Treating Provider's ASN:[id number]  Treating Provider's Phone [phone]  Treating Provider's Fax Number:[phone]  Reason for Visit (diagnosis):CAD 414.01 APPT 05/02/14

## 2014-04-29 NOTE — Telephone Encounter (Signed)
Dr Wynonia Lawman phone [phone]

## 2014-05-25 ENCOUNTER — Other Ambulatory Visit: Payer: Self-pay | Admitting: Internal Medicine

## 2014-06-11 ENCOUNTER — Ambulatory Visit: Payer: Commercial Managed Care - HMO | Admitting: Endocrinology

## 2014-06-11 ENCOUNTER — Ambulatory Visit (INDEPENDENT_AMBULATORY_CARE_PROVIDER_SITE_OTHER): Payer: Commercial Managed Care - HMO | Admitting: Endocrinology

## 2014-06-11 ENCOUNTER — Encounter: Payer: Self-pay | Admitting: Endocrinology

## 2014-06-11 VITALS — BP 134/62 | HR 114 | Temp 98.2°F | Ht 63.0 in | Wt 206.0 lb

## 2014-06-11 DIAGNOSIS — E1165 Type 2 diabetes mellitus with hyperglycemia: Secondary | ICD-10-CM

## 2014-06-11 DIAGNOSIS — IMO0002 Reserved for concepts with insufficient information to code with codable children: Secondary | ICD-10-CM

## 2014-06-11 DIAGNOSIS — E118 Type 2 diabetes mellitus with unspecified complications: Principal | ICD-10-CM

## 2014-06-11 NOTE — Progress Notes (Signed)
Subjective:    Patient ID: Laurie [name], female    DOB: [birth year], 68 y.o.   MRN: [id number]  HPI The state of at least three ongoing medical problems is addressed today, with interval history of each noted here:  Pt returns for f/u of diabetes mellitus: DM type: insulin-requiring type 2 Dx'ed: 3888 Complications: peripheral sensory neuropathy and CAD Therapy: insulin since GDM: 1985 DKA: never Severe hypoglycemia: never Pancreatitis: never Other: severe insulin resistance; required very little insulin during a hospitalization in early 2014 Interval history:  pt states she feels well in general.  Noncompliance with cbg recording: no cbg record today, but states cbg's vary from 70-220.  It is highest in the afternoon, and lowest in am. Obesity: she wants to pursue weight-loss surgery. Past Medical History  Diagnosis Date  . Bell's palsy 01/05/2010  . GOITER, MULTINODULAR 05/14/2010  . HEPATITIS 12/06/2007  . Thyromegaly     mild  . 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 Hyperlipidemia 04/18/2012    Intolerance to several statins   . Hypertensive heart disease without CHF   . HIstory of Bell's palsy     October 2012   . Obesity (BMI 30-39.9)   . Peripheral neuropathy   . Sleep apnea   . CAD (coronary artery disease)     Cath  07/21/1999  mild ostial, 80% stenosis proximal LAD, 90% stenosis proximal Diag 1, 95% stenosis proximal OM 1, 80% stenosis proximal RCA, 95% stenosis mid RCA    CABG 07/22/99  LIMA to LAD, SVG to dx, SVG to OM, SVG to PDA  Dr. Carmin Muskrat 2012 no ischemia EF 67%   . HIstory of Bell's palsy     October 2012     Past Surgical History  Procedure Laterality Date  . Coronary artery bypass graft  2000  . Tubal ligation    . Biopsy thyroid  05/2010    percutaneous  . Foot surgery    . Carpal tunnel release    . Dental surgery      History   Social History  . Marital Status: Married   Spouse Name: N/A    Number of Children: 2  . Years of Education: N/A   Occupational History  .     Social History Main Topics  . Smoking status: Never Smoker   . Smokeless tobacco: Never Used  . Alcohol Use: No  . Drug Use: No  . Sexual Activity: No   Other Topics Concern  . Not on file   Social History Narrative   Does not work outside the home    Current Outpatient Prescriptions on File Prior to Visit  Medication Sig Dispense Refill  . AMBULATORY NON FORMULARY MEDICATION Diabetic Nutrition Pack with Cinannon, Chromium and Vitamin D3 1500 IU      . AMBULATORY NON FORMULARY MEDICATION Omega Q Plus - Take 1 capsule once daily      . aspirin EC 81 MG EC tablet Take 1 tablet (81 mg total) by mouth daily.      [name] b complex vitamins tablet Take 1 tablet by mouth daily.      . B-D ULTRAFINE III SHORT PEN 31G X 8 MM MISC       . beta carotene w/minerals (OCUVITE) tablet Take 1 tablet by mouth daily.      . Cholecalciferol (VITAMIN D3) 50000 UNITS CAPS Take 1 tablet by mouth daily.      [name]  ezetimibe (ZETIA) 10 MG tablet Take 1 tablet (10 mg total) by mouth daily.  90 tablet  3  . furosemide (LASIX) 40 MG tablet TAKE 1 AND 1/2 TABLETS EVERY DAY AS NEEDED  135 tablet  1  . Glucosamine-Chondroitin (MOVE FREE PO) Take 1 capsule by mouth daily.      [name] glucose blood (PRODIGY NO CODING BLOOD GLUC) test strip Check blood sugar 4 times per day.  400 each  1  . Insulin Glargine (LANTUS) 100 UNIT/ML Solostar Pen Inject 30 Units into the skin at bedtime.       . insulin lispro (HUMALOG) 100 UNIT/ML KiwkPen 130 units with breakfast, 250 units with lunch, 320 units with evening meal,  also pen needles 4/day,      . ketoconazole (NIZORAL) 2 % cream APPLY TOPICALLY AS NEEDED  15 g  2  . lisinopril (PRINIVIL,ZESTRIL) 40 MG tablet TAKE ONE TABLET BY MOUTH ONCE DAILY  90 tablet  3  . loratadine (CLARITIN) 10 MG tablet Take 10 mg by mouth daily.      . Multiple Vitamin (MULTIVITAMIN) tablet Take 1 tablet by  mouth daily.        . potassium chloride SA (KLOR-CON M20) 20 MEQ tablet Take 1 tablet (20 mEq total) by mouth 2 (two) times daily.  180 tablet  3  . Probiotic Product (PROBIOTIC DAILY) CAPS Take 1 capsule by mouth daily.      . sodium chloride (OCEAN) 0.65 % nasal spray Place 1 spray into the nose as needed for congestion.      . verapamil (CALAN-SR) 120 MG CR tablet TAKE ONE TABLET BY MOUTH ONCE DAILY  30 tablet  0  . omeprazole (PRILOSEC) 20 MG capsule Take 1 capsule (20 mg total) by mouth daily as needed (acid flux).  30 capsule  12   No current facility-administered medications on file prior to visit.    Allergies  Allergen Reactions  . Statins     Intolerance to several  . Tricor [Fenofibrate]     Leg pain    Family History  Problem Relation Age of Onset  . Diabetes Father   . Heart disease Father   . Diabetes Other     Siblings and 2 children  . Heart disease Other     CAD  . Diabetes Mother     BP 134/62  Pulse 114  Temp(Src) 98.2 F (36.8 C) (Oral)  Ht 5\' 3"  (1.6 m)  Wt 206 lb (93.441 kg)  BMI 36.50 kg/m2  SpO2 92%  Review of Systems She denies hypoglycemia and weight change.      Objective:   Physical Exam VITAL SIGNS:  See vs page.   GENERAL: no distress. Pulses: dorsalis pedis intact bilat.  Skin: no ulcer on the feet. feet are of normal color and temp.  There is a healed vein harvest scar at the right leg.  Feet: no deformity.  Trace bilat leg edema.  Neuro: sensation is intact to touch on the feet, but decreased from normal.     Lab Results  Component Value Date   HGBA1C 8.0* 06/11/2014       Assessment & Plan:  DM: moderate exacerbation Noncompliance with cbg recording, peristent: I'll work around this as best I can.   Obesity: persistent.  Patient is advised the following: Patient Instructions  check your blood sugar 4 times a day--before the 3 meals, and at bedtime.  also check if you have symptoms of your blood sugar being  too high or  too low.  please keep a record of the readings and bring it to your next appointment here.  please call us sooner if you are having low blood sugar episodes.   Please come back for a follow-up appointment in 3 months.   blood tests are being requested for you today.  We'll contact you with results.     Please call pharmacy to see if drawing up NPH and Reg would be cheaper.   Please see a weight-loss surgery specialist.  you will receive a phone call, about a day and time for an appointment.

## 2014-06-11 NOTE — Patient Instructions (Addendum)
check your blood sugar 4 times a day--before the 3 meals, and at bedtime.  also check if you have symptoms of your blood sugar being too high or too low.  please keep a record of the readings and bring it to your next appointment here.  please call us sooner if you are having low blood sugar episodes.   Please come back for a follow-up appointment in 3 months.   blood tests are being requested for you today.  We'll contact you with results.     Please call pharmacy to see if drawing up NPH and Reg would be cheaper.   Please see a weight-loss surgery specialist.  you will receive a phone call, about a day and time for an appointment.

## 2014-06-12 LAB — LIPID PANEL
CHOL/HDL RATIO: 4 ratio
Cholesterol: 190 mg/dL (ref 0–200)
HDL: 48 mg/dL (ref >39)
LDL CALC: 87 mg/dL (ref 0–99)
TRIGLYCERIDES: 276 mg/dL — AB (ref <150)
VLDL: 55 mg/dL — AB (ref 0–40)

## 2014-06-12 LAB — HEMOGLOBIN A1C
Hgb A1c MFr Bld: 8 % — ABNORMAL HIGH (ref <5.7)
Mean Plasma Glucose: 183 mg/dL — ABNORMAL HIGH (ref <117)

## 2014-06-23 ENCOUNTER — Other Ambulatory Visit: Payer: Self-pay | Admitting: Internal Medicine

## 2014-06-26 ENCOUNTER — Ambulatory Visit (INDEPENDENT_AMBULATORY_CARE_PROVIDER_SITE_OTHER): Payer: Commercial Managed Care - HMO

## 2014-06-26 DIAGNOSIS — Z23 Encounter for immunization: Secondary | ICD-10-CM

## 2014-07-11 ENCOUNTER — Encounter: Payer: Self-pay | Admitting: Internal Medicine

## 2014-07-11 ENCOUNTER — Ambulatory Visit (INDEPENDENT_AMBULATORY_CARE_PROVIDER_SITE_OTHER): Payer: Commercial Managed Care - HMO | Admitting: Internal Medicine

## 2014-07-11 VITALS — BP 132/70 | HR 88 | Temp 98.0°F | Resp 20 | Ht 63.0 in | Wt 209.0 lb

## 2014-07-11 DIAGNOSIS — E118 Type 2 diabetes mellitus with unspecified complications: Secondary | ICD-10-CM

## 2014-07-11 DIAGNOSIS — E1165 Type 2 diabetes mellitus with hyperglycemia: Secondary | ICD-10-CM

## 2014-07-11 DIAGNOSIS — L0390 Cellulitis, unspecified: Secondary | ICD-10-CM

## 2014-07-11 DIAGNOSIS — IMO0002 Reserved for concepts with insufficient information to code with codable children: Secondary | ICD-10-CM

## 2014-07-11 NOTE — Progress Notes (Signed)
Subjective:    Patient ID: Laurie [name], female    DOB: April 14, 1946, 68 y.o.   MRN: [id number]  HPI 68 year old patient who has a history of insulin requiring diabetes complicated by neuropathy and vascular disease.  She is status post CABG. For the past few days she has noted a lesion involving her right lower leg.  She had some initial erythema and a vesicle which has ruptured and largely healed.  Past Medical History  Diagnosis Date  . Bell's palsy 01/05/2010  . GOITER, MULTINODULAR 05/14/2010  . HEPATITIS 12/06/2007  . Thyromegaly     mild  . 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 Hyperlipidemia 04/18/2012    Intolerance to several statins   . Hypertensive heart disease without CHF   . HIstory of Bell's palsy     October 2012   . Obesity (BMI 30-39.9)   . Peripheral neuropathy   . Sleep apnea   . CAD (coronary artery disease)     Cath  07/21/1999  mild ostial, 80% stenosis proximal LAD, 90% stenosis proximal Diag 1, 95% stenosis proximal OM 1, 80% stenosis proximal RCA, 95% stenosis mid RCA    CABG 07/22/99  LIMA to LAD, SVG to dx, SVG to OM, SVG to PDA  Dr. Carmin Muskrat 2012 no ischemia EF 67%   . HIstory of Bell's palsy     October 2012     History   Social History  . Marital Status: Married    Spouse Name: N/A    Number of Children: 2  . Years of Education: N/A   Occupational History  .     Social History Main Topics  . Smoking status: Never Smoker   . Smokeless tobacco: Never Used  . Alcohol Use: No  . Drug Use: No  . Sexual Activity: No   Other Topics Concern  . Not on file   Social History Narrative   Does not work outside the home    Past Surgical History  Procedure Laterality Date  . Coronary artery bypass graft  2000  . Tubal ligation    . Biopsy thyroid  05/2010    percutaneous  . Foot surgery    . Carpal tunnel release    . Dental surgery      Family History  Problem Relation  Age of Onset  . Diabetes Father   . Heart disease Father   . Diabetes Other     Siblings and 2 children  . Heart disease Other     CAD  . Diabetes Mother     Allergies  Allergen Reactions  . Statins     Intolerance to several  . Tricor [Fenofibrate]     Leg pain    Current Outpatient Prescriptions on File Prior to Visit  Medication Sig Dispense Refill  . AMBULATORY NON FORMULARY MEDICATION Diabetic Nutrition Pack with Cinannon, Chromium and Vitamin D3 1500 IU      . AMBULATORY NON FORMULARY MEDICATION Omega Q Plus - Take 1 capsule once daily      . aspirin EC 81 MG EC tablet Take 1 tablet (81 mg total) by mouth daily.      [name] b complex vitamins tablet Take 1 tablet by mouth daily.      . B-D ULTRAFINE III SHORT PEN 31G X 8 MM MISC       . beta carotene w/minerals (OCUVITE) tablet Take 1 tablet by mouth daily.      [name]  ezetimibe (ZETIA) 10 MG tablet Take 1 tablet (10 mg total) by mouth daily.  90 tablet  3  . furosemide (LASIX) 40 MG tablet TAKE 1 AND 1/2 TABLETS EVERY DAY AS NEEDED  135 tablet  1  . Glucosamine-Chondroitin (MOVE FREE PO) Take 1 capsule by mouth daily.      [name] glucose blood (PRODIGY NO CODING BLOOD GLUC) test strip Check blood sugar 4 times per day.  400 each  1  . Insulin Glargine (LANTUS) 100 UNIT/ML Solostar Pen Inject 40 Units into the skin at bedtime. IF HAS A SNACK DUE AN ADDITIONAL 25 UNITS AT BEDTIME      . insulin lispro (HUMALOG) 100 UNIT/ML KiwkPen 130 units with breakfast, 250 units with lunch, 320 units with evening meal,  also pen needles 4/day,      . ketoconazole (NIZORAL) 2 % cream APPLY TOPICALLY AS NEEDED  15 g  2  . lisinopril (PRINIVIL,ZESTRIL) 40 MG tablet TAKE ONE TABLET BY MOUTH ONCE DAILY  90 tablet  3  . loratadine (CLARITIN) 10 MG tablet Take 10 mg by mouth daily.      . Multiple Vitamin (MULTIVITAMIN) tablet Take 1 tablet by mouth daily.        . potassium chloride SA (KLOR-CON M20) 20 MEQ tablet Take 1 tablet (20 mEq total) by mouth 2 (two)  times daily.  180 tablet  3  . Probiotic Product (PROBIOTIC DAILY) CAPS Take 1 capsule by mouth daily.      . sodium chloride (OCEAN) 0.65 % nasal spray Place 1 spray into the nose as needed for congestion.      . verapamil (VERELAN PM) 120 MG 24 hr capsule TAKE ONE TABLET BY MOUTH ONCE DAILY  30 capsule  10  . Cholecalciferol (VITAMIN D3) 50000 UNITS CAPS Take 1 tablet by mouth daily.      [name] omeprazole (PRILOSEC) 20 MG capsule Take 1 capsule (20 mg total) by mouth daily as needed (acid flux).  30 capsule  12   No current facility-administered medications on file prior to visit.    BP 132/70  Pulse 88  Temp(Src) 98 F (36.7 C) (Oral)  Resp 20  Ht 5\' 3"  (1.6 m)  Wt 209 lb (94.802 kg)  BMI 37.03 kg/m2  SpO2 97%       Review of Systems  Skin: Positive for wound.       Objective:   Physical Exam  Constitutional: She appears well-developed and well-nourished. No distress.  Blood pressure 130/70  Skin:  Small 1 cm dry, mildly erythematous macular lesion involving her right lower leg at the midpoint of a surgical scar from prior vein stripping This area is not infected nor draining          Assessment & Plan:   Largely resolved soft tissue infection DM  Local wound care discussed  CPX 6 months

## 2014-07-11 NOTE — Progress Notes (Signed)
Pre visit review using our clinic review tool, if applicable. No additional management support is needed unless otherwise documented below in the visit note. 

## 2014-07-11 NOTE — Patient Instructions (Signed)
Please check your hemoglobin A1c every 3 months  Limit your sodium (Salt) intake    It is important that you exercise regularly, at least 20 minutes 3 to 4 times per week.  If you develop chest pain or shortness of breath seek  medical attention.  You need to lose weight.  Consider a lower calorie diet and regular exercise. 

## 2014-08-22 ENCOUNTER — Encounter (HOSPITAL_COMMUNITY): Payer: Self-pay | Admitting: Cardiovascular Disease

## 2014-09-12 ENCOUNTER — Other Ambulatory Visit: Payer: Self-pay | Admitting: *Deleted

## 2014-09-12 MED ORDER — ACCU-CHEK SMARTVIEW CONTROL VI LIQD: Status: AC

## 2014-09-12 MED ORDER — BD SWAB SINGLE USE REGULAR PADS: MEDICATED_PAD | Status: AC

## 2014-09-16 ENCOUNTER — Ambulatory Visit (INDEPENDENT_AMBULATORY_CARE_PROVIDER_SITE_OTHER): Payer: Commercial Managed Care - HMO | Admitting: Endocrinology

## 2014-09-16 ENCOUNTER — Encounter: Payer: Self-pay | Admitting: Endocrinology

## 2014-09-16 VITALS — BP 142/70 | HR 119 | Temp 98.3°F | Ht 63.0 in | Wt 208.0 lb

## 2014-09-16 DIAGNOSIS — E118 Type 2 diabetes mellitus with unspecified complications: Secondary | ICD-10-CM

## 2014-09-16 DIAGNOSIS — E1165 Type 2 diabetes mellitus with hyperglycemia: Secondary | ICD-10-CM

## 2014-09-16 DIAGNOSIS — IMO0002 Reserved for concepts with insufficient information to code with codable children: Secondary | ICD-10-CM

## 2014-09-16 NOTE — Patient Instructions (Addendum)
check your blood sugar 4 times a day--before the 3 meals, and at bedtime.  also check if you have symptoms of your blood sugar being too high or too low.  please keep a record of the readings and bring it to your next appointment here.  please call us sooner if you are having low blood sugar episodes.   Please come back for a follow-up appointment in 3 months.   blood tests are being requested for you today.  We'll contact you with results.     Based on the results, i'll change the insulins to bottles, and send prescriptions [facility] to [facility], 3 months at a time.   Please continue your weight-loss efforts.   Please continue the same medication, for your blood pressure.  i'll leave the heart rate to Dr [name].

## 2014-09-16 NOTE — Progress Notes (Signed)
Subjective:    Patient ID: Laurie [name], female    DOB: [birth year], 69 y.o.   MRN: [id number]  HPI The state of at least three ongoing medical problems is addressed today, with interval history of each noted here: Pt returns for f/u of diabetes mellitus:   DM type: insulin-requiring type 2 Dx'ed: 0177 Complications: peripheral sensory neuropathy and CAD Therapy: insulin since 2011. GDM: 1985 DKA: never Severe hypoglycemia: never Pancreatitis: never Other: she has severe insulin resistance; she required very little insulin during a hospitalization in early 2014 Interval history:  pt states she feels well in general.  she wants to change to syringe and vial.  she brings a record of her cbg's which i have reviewed today.  It varies from 100-30.  She takes 25 extra units of lantus at hs, if she eats, then (which is most nights). Obesity: she wants to delay weight-loss surgery for now.   Past Medical History  Diagnosis Date  . Bell's palsy 01/05/2010  . GOITER, MULTINODULAR 05/14/2010  . HEPATITIS 12/06/2007  . Thyromegaly     mild  . 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 Hyperlipidemia 04/18/2012    Intolerance to several statins   . Hypertensive heart disease without CHF   . HIstory of Bell's palsy     October 2012   . Obesity (BMI 30-39.9)   . Peripheral neuropathy   . Sleep apnea   . CAD (coronary artery disease)     Cath  07/21/1999  mild ostial, 80% stenosis proximal LAD, 90% stenosis proximal Diag 1, 95% stenosis proximal OM 1, 80% stenosis proximal RCA, 95% stenosis mid RCA    CABG 07/22/99  LIMA to LAD, SVG to dx, SVG to OM, SVG to PDA  Dr. Carmin Muskrat 2012 no ischemia EF 67%   . HIstory of Bell's palsy     October 2012     Past Surgical History  Procedure Laterality Date  . Coronary artery bypass graft  2000  . Tubal ligation    . Biopsy thyroid  05/2010    percutaneous  . Foot surgery    . Carpal  tunnel release    . Dental surgery    . Temporary pacemaker insertion N/A 10/22/2012    Procedure: TEMPORARY PACEMAKER INSERTION;  Surgeon: Troy Sine, MD;  Location[facility] CATH LAB;  Service: Cardiovascular;  Laterality: N/A;  . Left heart catheterization with coronary angiogram N/A 10/23/2012    Procedure: LEFT HEART CATHETERIZATION WITH CORONARY ANGIOGRAM;  Surgeon: Jacolyn Reedy, MD;  Location: [facility] CATH LAB;  Service: Cardiovascular;  Laterality: N/A;  . Permanent pacemaker insertion N/A 10/23/2012    Procedure: PERMANENT PACEMAKER INSERTION;  Surgeon: Deboraha Sprang, MD;  Location: [facility] CATH LAB;  Service: Cardiovascular;  Laterality: N/A;  . Lead revision N/A 10/25/2012    Procedure: LEAD REVISION;  Surgeon: Evans Lance, MD;  Location: [facility] CATH LAB;  Service: Cardiovascular;  Laterality: N/A;    History   Social History  . Marital Status: Married    Spouse Name: N/A    Number of Children: 2  . Years of Education: N/A   Occupational History  .     Social History Main Topics  . Smoking status: Never Smoker   . Smokeless tobacco: Never Used  . Alcohol Use: No  . Drug Use: No  . Sexual Activity: No   Other Topics Concern  . Not on file  Social History Narrative   Does not work outside the home    Current Outpatient Prescriptions on File Prior to Visit  Medication Sig Dispense Refill  . Alcohol Swabs (B-D SINGLE USE SWABS REGULAR) PADS Use 8 daily for testing blood sugar and insulin injections. 800 each 1  . AMBULATORY NON FORMULARY MEDICATION Diabetic Nutrition Pack with Cinannon, Chromium and Vitamin D3 1500 IU    . AMBULATORY NON FORMULARY MEDICATION Omega Q Plus - Take 1 capsule once daily    . aspirin EC 81 MG EC tablet Take 1 tablet (81 mg total) by mouth daily.    [name] b complex vitamins tablet Take 1 tablet by mouth daily.    . B-D ULTRAFINE III SHORT PEN 31G X 8 MM MISC     . beta carotene w/minerals (OCUVITE) tablet Take 1 tablet by mouth daily.    . Blood Glucose  Calibration (ACCU-CHEK SMARTVIEW CONTROL) LIQD Use to calibrate blood glucose meter. 3 each 1  . Cholecalciferol (VITAMIN D3) 50000 UNITS CAPS Take 1 tablet by mouth daily.    [name] ezetimibe (ZETIA) 10 MG tablet Take 1 tablet (10 mg total) by mouth daily. 90 tablet 3  . furosemide (LASIX) 40 MG tablet TAKE 1 AND 1/2 TABLETS EVERY DAY AS NEEDED 135 tablet 1  . Glucosamine-Chondroitin (MOVE FREE PO) Take 1 capsule by mouth daily.    [name] glucose blood (PRODIGY NO CODING BLOOD GLUC) test strip Check blood sugar 4 times per day. 400 each 1  . Insulin Glargine (LANTUS) 100 UNIT/ML Solostar Pen Inject 40 Units into the skin at bedtime. IF HAS A SNACK DUE AN ADDITIONAL 25 UNITS AT BEDTIME    . insulin lispro (HUMALOG) 100 UNIT/ML KiwkPen 130 units with breakfast, 250 units with lunch, 320 units with evening meal,  also pen needles 4/day,    . ketoconazole (NIZORAL) 2 % cream APPLY TOPICALLY AS NEEDED 15 g 2  . lisinopril (PRINIVIL,ZESTRIL) 40 MG tablet TAKE ONE TABLET BY MOUTH ONCE DAILY 90 tablet 3  . loratadine (CLARITIN) 10 MG tablet Take 10 mg by mouth daily.    . Multiple Vitamin (MULTIVITAMIN) tablet Take 1 tablet by mouth daily.      . potassium chloride SA (KLOR-CON M20) 20 MEQ tablet Take 1 tablet (20 mEq total) by mouth 2 (two) times daily. 180 tablet 3  . Probiotic Product (PROBIOTIC DAILY) CAPS Take 1 capsule by mouth daily.    . sodium chloride (OCEAN) 0.65 % nasal spray Place 1 spray into the nose as needed for congestion.    . verapamil (VERELAN PM) 120 MG 24 hr capsule TAKE ONE TABLET BY MOUTH ONCE DAILY 30 capsule 10  . omeprazole (PRILOSEC) 20 MG capsule Take 1 capsule (20 mg total) by mouth daily as needed (acid flux). 30 capsule 12   No current facility-administered medications on file prior to visit.    Allergies  Allergen Reactions  . Statins     Intolerance to several  . Tricor [Fenofibrate]     Leg pain    Family History  Problem Relation Age of Onset  . Diabetes Father   .  Heart disease Father   . Diabetes Other     Siblings and 2 children  . Heart disease Other     CAD  . Diabetes Mother     BP 142/70 mmHg  Pulse 119  Temp(Src) 98.3 F (36.8 C) (Oral)  Ht 5\' 3"  (1.6 m)  Wt 208 lb (94.348 kg)  BMI 36.85  kg/m2  SpO2 94%    Review of Systems She denies hypoglycemia and weight change.      Objective:   Physical Exam VITAL SIGNS:  See vs page.  GENERAL: no distress.  Pulses: dorsalis pedis intact bilat.  Skin: no ulcer on the feet. feet are of normal color and temp. There is a healed vein harvest scar at the right leg.  Feet: no deformity. 1+ bilat leg edema.  Neuro: sensation is intact to touch on the feet, but decreased from normal.    Lab Results  Component Value Date   HGBA1C 8.3* 09/16/2014      Assessment & Plan:  DM: moderate exacerbation Tachycardia, due to autonomic neuropathy vs deconditioning, or both.  HTN: persistent Obesity: persistent.   Patient is advised the following: Patient Instructions  check your blood sugar 4 times a day--before the 3 meals, and at bedtime.  also check if you have symptoms of your blood sugar being too high or too low.  please keep a record of the readings and bring it to your next appointment here.  please call us sooner if you are having low blood sugar episodes.   Please come back for a follow-up appointment in 3 months.   blood tests are being requested for you today.  We'll contact you with results.     Based on the results, i'll change the insulins to bottles, and [facility] to [facility], 3 months at a time.   Please continue your weight-loss efforts.   Please continue the same medication, for your blood pressure.  i'll leave the heart rate to Dr [name].

## 2014-09-17 LAB — MICROALBUMIN / CREATININE URINE RATIO
CREATININE, URINE: 68.4 mg/dL
MICROALB UR: 0.2 mg/dL (ref <2.0)
MICROALB/CREAT RATIO: 2.9 mg/g (ref 0.0–30.0)

## 2014-09-17 LAB — HEMOGLOBIN A1C
Hgb A1c MFr Bld: 8.3 % — ABNORMAL HIGH (ref <5.7)
Mean Plasma Glucose: 192 mg/dL — ABNORMAL HIGH (ref <117)

## 2014-09-17 MED ORDER — INSULIN NPH (HUMAN) (ISOPHANE) 100 UNIT/ML ~~LOC~~ SUSP: 40.0000 [IU] | Freq: Every day | SUBCUTANEOUS | Status: DC

## 2014-09-17 MED ORDER — INSULIN REGULAR HUMAN 100 UNIT/ML IJ SOLN: INTRAMUSCULAR | Status: DC

## 2014-09-30 ENCOUNTER — Ambulatory Visit: Payer: Commercial Managed Care - HMO | Admitting: Internal Medicine

## 2014-10-09 ENCOUNTER — Other Ambulatory Visit: Payer: Self-pay | Admitting: Internal Medicine

## 2014-10-10 ENCOUNTER — Other Ambulatory Visit: Payer: Self-pay | Admitting: *Deleted

## 2014-10-10 MED ORDER — FUROSEMIDE 40 MG PO TABS: ORAL_TABLET | ORAL | Status: DC

## 2014-10-11 ENCOUNTER — Telehealth: Payer: Self-pay | Admitting: Internal Medicine

## 2014-10-11 DIAGNOSIS — H269 Unspecified cataract: Secondary | ICD-10-CM

## 2014-10-11 NOTE — Telephone Encounter (Signed)
Order for Referral to Ophthalmology.

## 2014-10-11 NOTE — Telephone Encounter (Signed)
Pt called to ask for a referral to see [facility] [place]  Pt appt 10/18/14     Reason for visit Cataracts

## 2014-10-14 ENCOUNTER — Ambulatory Visit (INDEPENDENT_AMBULATORY_CARE_PROVIDER_SITE_OTHER): Payer: Commercial Managed Care - HMO | Admitting: Internal Medicine

## 2014-10-14 ENCOUNTER — Encounter: Payer: Self-pay | Admitting: Internal Medicine

## 2014-10-14 VITALS — BP 124/76 | HR 121 | Ht 63.0 in | Wt 211.4 lb

## 2014-10-14 DIAGNOSIS — Z45018 Encounter for adjustment and management of other part of cardiac pacemaker: Secondary | ICD-10-CM

## 2014-10-14 DIAGNOSIS — I442 Atrioventricular block, complete: Secondary | ICD-10-CM | POA: Diagnosis not present

## 2014-10-14 LAB — MDC_IDC_ENUM_SESS_TYPE_INCLINIC
Brady Statistic AP VP Percent: 1 %
Brady Statistic AP VS Percent: 2 %
Brady Statistic AS VP Percent: 39 %
Lead Channel Impedance Value: 624 Ohm
Lead Channel Pacing Threshold Amplitude: 0.5 V
Lead Channel Pacing Threshold Pulse Width: 0.4 ms
Lead Channel Sensing Intrinsic Amplitude: 15.67 mV
Lead Channel Setting Pacing Amplitude: 2 V
Lead Channel Setting Pacing Amplitude: 2.5 V
Lead Channel Setting Sensing Sensitivity: 4 mV
MDC IDC MSMT BATTERY IMPEDANCE: 135 Ohm
MDC IDC MSMT BATTERY REMAINING LONGEVITY: 138 mo
MDC IDC MSMT BATTERY VOLTAGE: 2.8 V
MDC IDC MSMT LEADCHNL RA IMPEDANCE VALUE: 452 Ohm
MDC IDC MSMT LEADCHNL RA PACING THRESHOLD AMPLITUDE: 0.75 V
MDC IDC MSMT LEADCHNL RA PACING THRESHOLD PULSEWIDTH: 0.4 ms
MDC IDC MSMT LEADCHNL RA SENSING INTR AMPL: 4 mV
MDC IDC SESS DTM: 20160201160057
MDC IDC SET LEADCHNL RV PACING PULSEWIDTH: 0.4 ms
MDC IDC STAT BRADY AS VS PERCENT: 58 %

## 2014-10-14 NOTE — Patient Instructions (Signed)
Your physician recommends that you continue on your current medications as directed. Please refer to the Current Medication list given to you today.  Your physician wants you to follow-up in: 1 year with Dr. Klein.  You will receive a reminder letter in the mail two months in advance. If you don't receive a letter, please call our office to schedule the follow-up appointment.  

## 2014-10-14 NOTE — Progress Notes (Signed)
Patient Care Team: Marletta Lor, MD as PCP - General Jacolyn Reedy, MD as Consulting Physician (Cardiology)   HPI  Laurie [name] is a 69 y.o. female Seen in followup for pacemaker implantation 2/14 for intermittent complete heart block and syncope. she is also noted to have some degree of sinus tachycardia with her mean heart rate right at 100 beats per minute  she has been intolerant of beta blockers and is currently on calcium blocker.  At the last visit we were anticipating repeat echocardiogram. I suspect it was done at Dr. Thurman Coyer office; we do not have record of it.  She denies significant changes in her functional status although her first comment is that she is "well-developed like a top". The story underlying this is one of the incredible frustration with the health care system related to the care of her daughter with her long-standing seizure disorder.   She has a history of coronary artery disease with prior CABG. [place] 2/14 demonstrated patent vein graft to the diagonal OM RCA and LIMA normal left ventricular function   Past Medical History  Diagnosis Date  . Bell's palsy 01/05/2010  . GOITER, MULTINODULAR 05/14/2010  . HEPATITIS 12/06/2007  . Thyromegaly     mild  . 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 Hyperlipidemia 04/18/2012    Intolerance to several statins   . Hypertensive heart disease without CHF   . HIstory of Bell's palsy     October 2012   . Obesity (BMI 30-39.9)   . Peripheral neuropathy   . Sleep apnea   . CAD (coronary artery disease)     Cath  07/21/1999  mild ostial, 80% stenosis proximal LAD, 90% stenosis proximal Diag 1, 95% stenosis proximal OM 1, 80% stenosis proximal RCA, 95% stenosis mid RCA    CABG 07/22/99  LIMA to LAD, SVG to dx, SVG to OM, SVG to PDA  Dr. Carmin Muskrat 2012 no ischemia EF 67%   . HIstory of Bell's palsy     October 2012     Past Surgical History    Procedure Laterality Date  . Coronary artery bypass graft  2000  . Tubal ligation    . Biopsy thyroid  05/2010    percutaneous  . Foot surgery    . Carpal tunnel release    . Dental surgery    . Temporary pacemaker insertion N/A 10/22/2012    Procedure: TEMPORARY PACEMAKER INSERTION;  Surgeon: Troy Sine, MD;  Location: [facility] CATH LAB;  Service: Cardiovascular;  Laterality: N/A;  . Left heart catheterization with coronary angiogram N/A 10/23/2012    Procedure: LEFT HEART CATHETERIZATION WITH CORONARY ANGIOGRAM;  Surgeon: Jacolyn Reedy, MD;  Location: [facility] CATH LAB;  Service: Cardiovascular;  Laterality: N/A;  . Permanent pacemaker insertion N/A 10/23/2012    Procedure: PERMANENT PACEMAKER INSERTION;  Surgeon: Deboraha Sprang, MD;  Location: [facility] CATH LAB;  Service: Cardiovascular;  Laterality: N/A;  . Lead revision N/A 10/25/2012    Procedure: LEAD REVISION;  Surgeon: Evans Lance, MD;  Location: [facility] CATH LAB;  Service: Cardiovascular;  Laterality: N/A;    Current Outpatient Prescriptions  Medication Sig Dispense Refill  . Alcohol Swabs (B-D SINGLE USE SWABS REGULAR) PADS Use 8 daily for testing blood sugar and insulin injections. 800 each 1  . AMBULATORY NON FORMULARY MEDICATION Diabetic Nutrition Pack with Cinannon, Chromium and Vitamin D3 1500 IU    .  AMBULATORY NON FORMULARY MEDICATION Omega Q Plus - Take 1 capsule once daily    . aspirin EC 81 MG EC tablet Take 1 tablet (81 mg total) by mouth daily.    [name] b complex vitamins tablet Take 1 tablet by mouth daily.    . B-D ULTRAFINE III SHORT PEN 31G X 8 MM MISC     . beta carotene w/minerals (OCUVITE) tablet Take 1 tablet by mouth daily.    . Blood Glucose Calibration (ACCU-CHEK SMARTVIEW CONTROL) LIQD Use to calibrate blood glucose meter. 3 each 1  . Cholecalciferol (VITAMIN D3) 50000 UNITS CAPS Take 1 tablet by mouth daily.    [name] ezetimibe (ZETIA) 10 MG tablet Take 1 tablet (10 mg total) by mouth daily. 90 tablet 3  . furosemide (LASIX)  40 MG tablet TAKE 1 AND 1/2 TABLETS EVERY DAY AS NEEDED (Patient taking differently: Take 60 mg by mouth daily. 1.5 tab (60 mg) daily by mouth.) 135 tablet 0  . glucose blood (PRODIGY NO CODING BLOOD GLUC) test strip Check blood sugar 4 times per day. 400 each 1  . insulin NPH Human (HUMULIN N,NOVOLIN N) 100 UNIT/ML injection Inject 0.4 mLs (40 Units total) into the skin at bedtime. 20 mL 11  . insulin regular (NOVOLIN R,HUMULIN R) 100 units/mL injection 150 units with breakfast, 270 units with lunch, 340 units with evening meal,  also 100-unit syringes 10/day, 260 mL 11  . ketoconazole (NIZORAL) 2 % cream APPLY TOPICALLY AS NEEDED 15 g 2  . lisinopril (PRINIVIL,ZESTRIL) 40 MG tablet TAKE ONE TABLET BY MOUTH ONCE DAILY 90 tablet 3  . loratadine (CLARITIN) 10 MG tablet Take 10 mg by mouth daily.    . potassium chloride SA (KLOR-CON M20) 20 MEQ tablet Take 1 tablet (20 mEq total) by mouth 2 (two) times daily. 180 tablet 3  . Probiotic Product (PROBIOTIC DAILY) CAPS Take 1 capsule by mouth daily.    . sodium chloride (OCEAN) 0.65 % nasal spray Place 1 spray into the nose as needed for congestion.    . verapamil (VERELAN PM) 120 MG 24 hr capsule TAKE ONE TABLET BY MOUTH ONCE DAILY 30 capsule 10   No current facility-administered medications for this visit.    Allergies  Allergen Reactions  . Statins     Intolerance to several  . Tricor [Fenofibrate]     Leg pain    Review of Systems negative except from HPI and PMH  Physical Exam BP 124/76 mmHg  Pulse 121  Ht 5\' 3"  (1.6 m)  Wt 211 lb 6.4 oz (95.89 kg)  BMI 37.46 kg/m2 Well developed and well nourished in no acute distress HENT normal E scleral and icterus clear Neck Supple JVP flat; carotids brisk and full Clear to ausculation  Device pocket well healed; without hematoma or erythema.  There is no tetheringRegular rate and rhythm, no murmurs gallops or rub Soft with active bowel sounds No clubbing cyanosis Trace Edema Alert and  oriented, grossly normal motor and sensory function Skin Warm and Dry   Assessment and  Plan Intermittent complete heart block  Pacemaker-[employer] The patient's device was interrogated.  The information was reviewed. No changes were made in the programming.   Sinus tachycardia  Hypertension  Well controlled   Coronary artery disease status post CABG previously normal LV function  .Without symptoms of ischemia   Exercise tolerance has been stable. Her sinus tachycardia persists.    The story of her daughters seizures over the last months sounds exhausting.  She ascribes her tachycardia to the stress of this. I have been in contact with Dr. Wynonia Lawman had asked him to undertake a [facility] transmission in one month to assess heart rate excursion as the persistence of a heart rate in the 120s is quite anomalous from the data that we have for the last 9 months.

## 2014-10-18 DIAGNOSIS — H2511 Age-related nuclear cataract, right eye: Secondary | ICD-10-CM | POA: Diagnosis not present

## 2014-10-18 DIAGNOSIS — I10 Essential (primary) hypertension: Secondary | ICD-10-CM | POA: Diagnosis not present

## 2014-10-18 DIAGNOSIS — H18411 Arcus senilis, right eye: Secondary | ICD-10-CM | POA: Diagnosis not present

## 2014-10-18 DIAGNOSIS — H02839 Dermatochalasis of unspecified eye, unspecified eyelid: Secondary | ICD-10-CM | POA: Diagnosis not present

## 2014-10-23 ENCOUNTER — Telehealth: Payer: Self-pay | Admitting: Endocrinology

## 2014-10-23 NOTE — Telephone Encounter (Signed)
Pt states that over the last two weeks she has been having low blood sugar readings. Pt states her blood sugar has been dropping most of the time at breakfast and lunch. Breakfast readings will vary between 84, 96, and 50. At lunch time blood sugar has been in the low 90's to low 40s.  Pt confirmed that she is taking Novolin R 150 units with breakfast, 270 units with lunch, and 340 units at dinner and Novolin 40 units at bed time.  Please advise, Thanks!

## 2014-10-23 NOTE — Telephone Encounter (Signed)
Please reduce R to 120 units with breakfast, 270 units with lunch, and 340 units at dinner and NPH, 20 units at bed time.

## 2014-10-23 NOTE — Telephone Encounter (Signed)
Patient states she was advised to call Dr. Loanne Drilling if her blood sugars were low  Please have nurse call her back    Thank you

## 2014-10-23 NOTE — Telephone Encounter (Signed)
Requested call back from pt to discuss blood sugar readings.

## 2014-10-24 NOTE — Telephone Encounter (Signed)
Pt advised of note below and voiced understanding.  

## 2014-11-08 ENCOUNTER — Telehealth: Payer: Self-pay | Admitting: Internal Medicine

## 2014-11-08 NOTE — Telephone Encounter (Signed)
Authorization done  Outpatient Authorization #[id number]  11/11/2014 - 05/10/2015  Pt scheduled for 11-11-2014  Jari Pigg, MD  Medical [facility]  Address: [address] [place], [place], [place] [address]  [phone]

## 2014-11-11 ENCOUNTER — Encounter: Payer: Self-pay | Admitting: Cardiology

## 2014-11-11 DIAGNOSIS — I2510 Atherosclerotic heart disease of native coronary artery without angina pectoris: Secondary | ICD-10-CM | POA: Diagnosis not present

## 2014-11-11 DIAGNOSIS — E668 Other obesity: Secondary | ICD-10-CM | POA: Diagnosis not present

## 2014-11-11 DIAGNOSIS — Z951 Presence of aortocoronary bypass graft: Secondary | ICD-10-CM | POA: Diagnosis not present

## 2014-11-11 DIAGNOSIS — Z950 Presence of cardiac pacemaker: Secondary | ICD-10-CM | POA: Diagnosis not present

## 2014-11-11 DIAGNOSIS — E785 Hyperlipidemia, unspecified: Secondary | ICD-10-CM | POA: Diagnosis not present

## 2014-11-11 DIAGNOSIS — I119 Hypertensive heart disease without heart failure: Secondary | ICD-10-CM | POA: Diagnosis not present

## 2014-11-11 DIAGNOSIS — E1121 Type 2 diabetes mellitus with diabetic nephropathy: Secondary | ICD-10-CM | POA: Diagnosis not present

## 2014-11-11 DIAGNOSIS — R000 Tachycardia, unspecified: Secondary | ICD-10-CM | POA: Diagnosis not present

## 2014-11-11 DIAGNOSIS — G4733 Obstructive sleep apnea (adult) (pediatric): Secondary | ICD-10-CM | POA: Diagnosis not present

## 2014-11-11 NOTE — Progress Notes (Signed)
Patient ID: Laurie [name], female   DOB: Oct 10, 1945, 69 y.o.   MRN: [id number]   Laurie, [name]  Date of visit:  11/11/2014 DOB:  March 14, 1946    Age:  69 yrs. Medical record number:  [id number]     Account number:  [id number] Primary Care Provider: [name] ____________________________ CURRENT DIAGNOSES  1. Atherosclerotic heart disease of native coronary artery without angina pectoris  2. Hypertensive heart disease without heart failure  3. Presence of aortocoronary bypass graft  4. Hyperlipidemia, unspecified  5. Type 2 diabetes mellitus with diabetic nephropathy  6. Sinus tachycardia  7. Presence of cardiac pacemaker  8. Other obesity  9. Sleep apnea ____________________________ ALLERGIES  Atorvastatin, Muscle aches ____________________________ MEDICATIONS  1. Omega-3 350-235-90-640 mg capsule,delayed release(DR/EC), BID  2. ketoconazole 2 % Cream, BID  3. Probiotic 10 billion cell Capsule, PRN  4. lisinopril 40 mg Tablet, 1 p.o. daily  5. Claritin 10 mg tablet, PRN  6. Klor-Con M20 20 mEq tablet,ER particles/crystals, bid  7. Vitamin B-12 2,500 mcg tablet, sublingual, 1 p.o. daily  8. omeprazole 20 mg capsule,delayed release(DR/EC), PRN  9. Ocutabs tablet, 1 p.o. daily  10. Glucosamine-Chondroitin-MSM 500 mg-416.6 mg-20 mg-0.6 mg tablet, 1 p.o. daily  11. Afrin No Drip 0.05 % nasal mist, PRN  12. aspirin 81 mg chewable tablet, 1 p.o. daily  13. verapamil ER (SR) 240 mg tablet,extended release, 1 p.o. daily  14. Klor-Con M20 mEq tablet,extended release, BID  15. Vitamin C 500 mg tablet, 1 p.o. daily  16. furosemide 40 mg tablet, 1.5 p.o. daily  17. Novolin N 100 unit/mL subcutaneous suspension, 120u qam  270u lunch  340 qpm  18. Novolin R 100 unit/mL injection solution, 12o u qam  270u lunch  340u pm  19. Zetia 10 mg tablet, 1 p.o. daily ____________________________ CHIEF COMPLAINTS  Followup of Atherosclerotic heart disease of native coronary artery without  angina pectoris  Followup of Presence of cardiac pacemaker ____________________________ HISTORY OF PRESENT ILLNESS  Patient seen for cardiac followup. She is having a difficult time dealing with her daughter's health situation which includes the development of some seizures and she spent a great deal of time today talking about her experiences with various neurologists including ones here in 10 and at [facility] and it was very difficult to get her to stop talking about this as she is very stressed over it. She has gained weight since she was here. She is probably not exercising much. I got a note from Dr. [name] about the persistent sinus tachycardia who asked [place] to recheck her counters. We rechecked her counters today and she is still having some tachycardia but had normal left ventricular function in December of this year. I sent a copy of the echo to Dr. [name]. She denies angina and has no PND, orthopnea or edema. She is really not using Afrin nasal spray. Her blood pressure is up a little bit today. ____________________________ PAST HISTORY  Past Medical Illnesses:  hypertension, DM-non-insulin dependent, hyperlipidemia, obesity, sleep apnea, carpal tunnel syndrome, plantar fasicitis, Bell'spalsy, peripheral neuropathy, thyroid nodules, lumbar disc disease;  Cardiovascular Illnesses:  CAD;  Surgical Procedures:  CABG w LIMA to LAD, SVG to dx, SVG to OM, SVG to PDA 07/22/99 Dr. [name], tubal ligation, cyst removed right finger, surgery for heel spur, carpal tunnel release;  NYHA Classification:  II;  Canadian Angina Classification:  Class 0: Asymptomatic;  Cardiology Procedures-Invasive:  cardiac cath (left) February 2014, Medtronic pacemaker implant February 2014;  Cardiology Procedures-Noninvasive:  echocardiogram June 2011, lexiscan cardiolite August 2012, echocardiogram February 2014;  Cardiac Cath Results:  70% stenosis distal Left main, occluded LAD CFX RCA, widely patent Diag 1 OM 1 PDA SVG, widely  patent LAD LIMA graft;  LVEF of 60% documented via echocardiogram on 04/12/2014,   ____________________________ CARDIO-PULMONARY TEST DATES EKG Date:  05/02/2014;   Cardiac Cath Date:  10/23/2012;  CABG: 07/22/1999;  Nuclear Study Date:  04/22/2011;  Echocardiography Date: 10/22/2012;  Chest Xray Date: 10/27/2012;   ____________________________ FAMILY HISTORY Father -- Father dead, Coronary Artery Disease Mother -- Mother dead, Diabetes mellitus Sister -- Sister alive with problem, Hypercholesterolemia Sister -- Sister alive with problem, Diabetes mellitus Sister -- Sister alive and well Sister -- Sister alive and well ____________________________ SOCIAL HISTORY Alcohol Use:  no alcohol use;  Smoking:  never smoked;  Diet:  regular diet;  Lifestyle:  married;  Exercise:  exercises regularly and treadmill;  Occupation:  homemaker;  Residence:  lives with husband and son still at home and daughter at home;  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 ____________________________ REVIEW OF SYSTEMS General:  obesity, malaise and fatigue, weight gain of approximately 5 lbs Eyes: wears eye glasses/contact lenses, no diabetic retinopathy Respiratory: dyspnea with exertion Cardiovascular:  please review HPI Abdominal: dyspepsia Musculoskeletal:  arthritis of the left foot, nocturnal cramps, chronic low back pain, arthritis of the hips knees Neurological:  unsteady gait Endocrine: neuropathy  ____________________________ PHYSICAL EXAMINATION VITAL SIGNS  Blood Pressure:  166/80 Sitting, Right arm, regular cuff  , 154/84 Standing, Right arm and regular cuff   Pulse:  120/min. Weight:  212.00 lbs. Height:  64"BMI: 36  Constitutional:  pleasant white female, in no acute distress, moderately obese Skin:  warm and dry to touch, no apparent skin lesions, or masses noted. Head:  normocephalic, normal hair pattern, no masses or tenderness ENT:  ears, nose and throat reveal no gross abnormalities.  Dentition  good. Neck:  supple, no masses, thyromegaly, JVD. Carotid pulses are full and equal bilaterally without bruits. Chest:  clear to auscultation, healed median sternotomy scar, healed pacemaker incision in the left pectoral area Cardiac:  regular rhythm, normal S1 and S2, no S3 or S4, grade 1/6 systolic murmur heard best at the base Peripheral Pulses:  pulses full and equal in all extremities Extremities & Back:  well healed saphenous vein donor site RLE, trace edema ____________________________ MOST RECENT LIPID PANEL 10/23/12  CHOL TOTL 188 mg/dl, LDL 111 calc, HDL 37 mg/dl and TRIGLYCER 199 mg/dl ____________________________ IMPRESSIONS/PLAN  1. Persistent sinus tachycardia of uncertain cause. This does not appear to be causing any ventricular compromise. She was quite worked up today over her daughter and one wonders whether the anxiety and stress associated with this is driving some of this. 2. Coronary artery disease with previous bypass grafting 3. Diabetes mellitus with complications 4. Obesity with need to lose weight 5. Functioning permanent pacemaker  Recommendations:  Discussed with both patient and Dr. [name]. Her blood pressure was up some today and I suggested that she increase her verapamil to 240 mg daily as this should help both her blood pressure as well as tachycardia. Followup in 6 months. Call if problems. Discussed importance of weight reduction. ____________________________ TODAYS ORDERS  1. Return Visit: 6 months  2. 12 Lead EKG: 6 months                       ____________________________ Cardiology Physician:  Kerry Hough MD [facility]

## 2014-11-13 DIAGNOSIS — E1121 Type 2 diabetes mellitus with diabetic nephropathy: Secondary | ICD-10-CM | POA: Diagnosis not present

## 2014-11-13 DIAGNOSIS — I2510 Atherosclerotic heart disease of native coronary artery without angina pectoris: Secondary | ICD-10-CM | POA: Diagnosis not present

## 2014-11-13 DIAGNOSIS — Z950 Presence of cardiac pacemaker: Secondary | ICD-10-CM | POA: Diagnosis not present

## 2014-11-13 DIAGNOSIS — Z951 Presence of aortocoronary bypass graft: Secondary | ICD-10-CM | POA: Diagnosis not present

## 2014-11-13 DIAGNOSIS — E668 Other obesity: Secondary | ICD-10-CM | POA: Diagnosis not present

## 2014-11-13 DIAGNOSIS — R000 Tachycardia, unspecified: Secondary | ICD-10-CM | POA: Diagnosis not present

## 2014-11-13 DIAGNOSIS — I119 Hypertensive heart disease without heart failure: Secondary | ICD-10-CM | POA: Diagnosis not present

## 2014-11-13 DIAGNOSIS — E785 Hyperlipidemia, unspecified: Secondary | ICD-10-CM | POA: Diagnosis not present

## 2014-11-13 DIAGNOSIS — G4733 Obstructive sleep apnea (adult) (pediatric): Secondary | ICD-10-CM | POA: Diagnosis not present

## 2014-11-28 ENCOUNTER — Encounter: Payer: Self-pay | Admitting: Cardiology

## 2014-11-28 DIAGNOSIS — I119 Hypertensive heart disease without heart failure: Secondary | ICD-10-CM | POA: Diagnosis not present

## 2014-11-28 DIAGNOSIS — R000 Tachycardia, unspecified: Secondary | ICD-10-CM | POA: Diagnosis not present

## 2014-11-28 DIAGNOSIS — E1121 Type 2 diabetes mellitus with diabetic nephropathy: Secondary | ICD-10-CM | POA: Diagnosis not present

## 2014-11-28 DIAGNOSIS — E668 Other obesity: Secondary | ICD-10-CM | POA: Diagnosis not present

## 2014-11-28 DIAGNOSIS — Z951 Presence of aortocoronary bypass graft: Secondary | ICD-10-CM | POA: Diagnosis not present

## 2014-11-28 DIAGNOSIS — I2510 Atherosclerotic heart disease of native coronary artery without angina pectoris: Secondary | ICD-10-CM | POA: Diagnosis not present

## 2014-11-28 DIAGNOSIS — G4733 Obstructive sleep apnea (adult) (pediatric): Secondary | ICD-10-CM | POA: Diagnosis not present

## 2014-11-28 DIAGNOSIS — E785 Hyperlipidemia, unspecified: Secondary | ICD-10-CM | POA: Diagnosis not present

## 2014-11-28 DIAGNOSIS — R609 Edema, unspecified: Secondary | ICD-10-CM | POA: Diagnosis not present

## 2014-11-28 NOTE — Progress Notes (Unsigned)
Patient ID: Laurie [name], female   DOB: 08/09/1946, 69 y.o.   MRN: [id number]   Laurie [name], Laurie [name]  Date of visit:  11/28/2014 DOB:  22-Sep-1945    Age:  69 yrs. Medical record number:  [id number]     Account number:  [id number] Primary Care Provider: [name] ____________________________ CURRENT DIAGNOSES  1. Atherosclerotic heart disease of native coronary artery without angina pectoris  2. Edema  3. Hypertensive heart disease without heart failure  4. Presence of aortocoronary bypass graft  5. Hyperlipidemia, unspecified  6. Type 2 diabetes mellitus with diabetic nephropathy  7. Presence of cardiac pacemaker  8. Sinus tachycardia  9. Other obesity  10. Sleep apnea ____________________________ ALLERGIES  Atorvastatin, Muscle aches ____________________________ MEDICATIONS  1. Omega-3 350-235-90-640 mg capsule,delayed release(DR/EC), BID  2. ketoconazole 2 % Cream, BID  3. Probiotic 10 billion cell Capsule, PRN  4. lisinopril 40 mg Tablet, 1 p.o. daily  5. Claritin 10 mg tablet, PRN  6. Klor-Con M20 20 mEq tablet,ER particles/crystals, bid  7. Vitamin B-12 2,500 mcg tablet, sublingual, 1 p.o. daily  8. omeprazole 20 mg capsule,delayed release(DR/EC), PRN  9. Ocutabs tablet, 1 p.o. daily  10. Glucosamine-Chondroitin-MSM 500 mg-416.6 mg-20 mg-0.6 mg tablet, 1 p.o. daily  11. Afrin No Drip 0.05 % nasal mist, PRN  12. aspirin 81 mg chewable tablet, 1 p.o. daily  13. Klor-Con M20 mEq tablet,extended release, BID  14. Vitamin C 500 mg tablet, 1 p.o. daily  15. furosemide 40 mg tablet, 1.5 p.o. daily  16. Novolin N 100 unit/mL subcutaneous suspension, 120u qam  270u lunch  340 qpm  17. Novolin R 100 unit/mL injection solution, 12o u qam  270u lunch  340u pm  18. Zetia 10 mg tablet, 1 p.o. daily  19. verapamil ER (SR) 120 mg tablet,extended release, 1 p.o. daily ____________________________ CHIEF COMPLAINTS  Leg edema since verapamil  increase ____________________________ HISTORY OF PRESENT ILLNESS  Patient returns early for evaluation of edema. Since increasing the verapamil to 240 mg daily she is now having significant edema. It is worse as the day goes on and she tried to wear compression stockings but that it made her legs cramp. No real shortness of breath. Denies PND or orthopnea. No claudication. No significant angina. Heart rate remains fast despite increasing the verapamil. ____________________________ PAST HISTORY  Past Medical Illnesses:  hypertension, DM-non-insulin dependent, hyperlipidemia, obesity, sleep apnea, carpal tunnel syndrome, plantar fasicitis, Bell'spalsy, peripheral neuropathy, thyroid nodules, lumbar disc disease;  Cardiovascular Illnesses:  CAD;  Surgical Procedures:  CABG w LIMA to LAD, SVG to dx, SVG to OM, SVG to PDA 07/22/99 Dr. [name], tubal ligation, cyst removed right finger, surgery for heel spur, carpal tunnel release;  NYHA Classification:  II;  Canadian Angina Classification:  Class 0: Asymptomatic;  Cardiology Procedures-Invasive:  cardiac cath (left) February 2014, Medtronic pacemaker implant February 2014;  Cardiology Procedures-Noninvasive:  echocardiogram June 2011, lexiscan cardiolite August 2012, echocardiogram February 2014;  Cardiac Cath Results:  70% stenosis distal Left main, occluded LAD CFX RCA, widely patent Diag 1 OM 1 PDA SVG, widely patent LAD LIMA graft;  LVEF of 60% documented via echocardiogram on 04/12/2014,   ____________________________ CARDIO-PULMONARY TEST DATES EKG Date:  11/28/2014;   Cardiac Cath Date:  10/23/2012;  CABG: 07/22/1999;  Nuclear Study Date:  04/22/2011;  Echocardiography Date: 10/22/2012;  Chest Xray Date: 10/27/2012;   ____________________________ FAMILY HISTORY Father -- Father dead, Coronary Artery Disease Mother -- Mother dead, Diabetes mellitus Sister -- Sister alive with problem, Hypercholesterolemia  Sister -- Sister alive with problem, Diabetes  mellitus Sister -- Sister alive and well Sister -- Sister alive and well ____________________________ SOCIAL HISTORY Alcohol Use:  no alcohol use;  Smoking:  never smoked;  Diet:  regular diet;  Lifestyle:  married;  Exercise:  some exercise;  Occupation:  homemaker;  Residence:  lives with husband and son still at home and daughter at home;  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 Weight:  211.00 lbs. Height:  64"BMI: 36  Constitutional:  pleasant white female, in no acute distress, moderately obese Skin:  warm and dry to touch, no apparent skin lesions, or masses noted. Head:  normocephalic, normal hair pattern, no masses or tenderness ENT:  ears, nose and throat reveal no gross abnormalities.  Dentition good. Neck:  supple, no masses, thyromegaly, JVD. Carotid pulses are full and equal bilaterally without bruits. Chest:  clear to auscultation, healed median sternotomy scar, healed pacemaker incision in the left pectoral area Cardiac:  regular rhythm, normal S1 and S2, no S3 or S4, grade 1/6 systolic murmur heard best at the base Peripheral Pulses:  pulses full and equal in all extremities Extremities & Back:  well healed saphenous vein donor site RLE, 1+ edema  ____________________________ MOST RECENT LIPID PANEL 10/23/12  CHOL TOTL 188 mg/dl, LDL 111 calc, HDL 37 mg/dl and TRIGLYCER 199  mg/dl ____________________________ IMPRESSIONS/PLAN  1. Edema that may be a side effect of verapamil or could be due to chronic venous insufficiency 2. Hypertensive heart disease borderline control 3. Prior permanent pacemaker 4. Persistent sinus tachycardia  Recommendations:  Reduce verapamil back to the previous dose of 120 mg daily. Followup for her regular appointment and call if the edema doesn't go away. Discussed importance of weight loss or like her to try to wear support stockings again. Continue on higher dose of furosemide.  ____________________________ TODAYS ORDERS  1. Return Visit: keep appt  2. 12 Lead EKG: Today                       ____________________________ Cardiology Physician:  Kerry Hough MD [facility]

## 2014-12-11 ENCOUNTER — Other Ambulatory Visit: Payer: Self-pay | Admitting: Internal Medicine

## 2014-12-16 ENCOUNTER — Ambulatory Visit: Payer: Commercial Managed Care - HMO | Admitting: Endocrinology

## 2014-12-16 DIAGNOSIS — H25812 Combined forms of age-related cataract, left eye: Secondary | ICD-10-CM | POA: Diagnosis not present

## 2014-12-16 DIAGNOSIS — H2512 Age-related nuclear cataract, left eye: Secondary | ICD-10-CM | POA: Diagnosis not present

## 2014-12-16 HISTORY — PX: CATARACT EXTRACTION W/ INTRAOCULAR LENS IMPLANT: SHX1309

## 2014-12-17 DIAGNOSIS — H2511 Age-related nuclear cataract, right eye: Secondary | ICD-10-CM | POA: Diagnosis not present

## 2015-01-06 DIAGNOSIS — H2511 Age-related nuclear cataract, right eye: Secondary | ICD-10-CM | POA: Diagnosis not present

## 2015-01-06 DIAGNOSIS — H25811 Combined forms of age-related cataract, right eye: Secondary | ICD-10-CM | POA: Diagnosis not present

## 2015-01-06 HISTORY — PX: CATARACT EXTRACTION: SUR2

## 2015-01-10 ENCOUNTER — Encounter: Payer: Commercial Managed Care - HMO | Admitting: Internal Medicine

## 2015-01-17 ENCOUNTER — Ambulatory Visit (INDEPENDENT_AMBULATORY_CARE_PROVIDER_SITE_OTHER): Payer: Commercial Managed Care - HMO | Admitting: Internal Medicine

## 2015-01-17 ENCOUNTER — Encounter: Payer: Self-pay | Admitting: Internal Medicine

## 2015-01-17 VITALS — HR 95 | Temp 98.5°F | Resp 20 | Ht 62.5 in | Wt 214.0 lb

## 2015-01-17 DIAGNOSIS — Z0000 Encounter for general adult medical examination without abnormal findings: Secondary | ICD-10-CM

## 2015-01-17 DIAGNOSIS — E118 Type 2 diabetes mellitus with unspecified complications: Secondary | ICD-10-CM | POA: Diagnosis not present

## 2015-01-17 DIAGNOSIS — E785 Hyperlipidemia, unspecified: Secondary | ICD-10-CM | POA: Diagnosis not present

## 2015-01-17 DIAGNOSIS — G629 Polyneuropathy, unspecified: Secondary | ICD-10-CM

## 2015-01-17 DIAGNOSIS — E1165 Type 2 diabetes mellitus with hyperglycemia: Secondary | ICD-10-CM | POA: Diagnosis not present

## 2015-01-17 DIAGNOSIS — E1342 Other specified diabetes mellitus with diabetic polyneuropathy: Secondary | ICD-10-CM

## 2015-01-17 DIAGNOSIS — E1340 Other specified diabetes mellitus with diabetic neuropathy, unspecified: Secondary | ICD-10-CM

## 2015-01-17 DIAGNOSIS — Z951 Presence of aortocoronary bypass graft: Secondary | ICD-10-CM | POA: Diagnosis not present

## 2015-01-17 DIAGNOSIS — IMO0002 Reserved for concepts with insufficient information to code with codable children: Secondary | ICD-10-CM

## 2015-01-17 LAB — CBC WITH DIFFERENTIAL/PLATELET
Basophils Absolute: 0.1 10*3/uL (ref 0.0–0.1)
Basophils Relative: 1.2 % (ref 0.0–3.0)
EOS ABS: 0.1 10*3/uL (ref 0.0–0.7)
Eosinophils Relative: 1.9 % (ref 0.0–5.0)
HCT: 40.9 % (ref 36.0–46.0)
HEMOGLOBIN: 14 g/dL (ref 12.0–15.0)
LYMPHS ABS: 2.1 10*3/uL (ref 0.7–4.0)
Lymphocytes Relative: 28.8 % (ref 12.0–46.0)
MCHC: 34.2 g/dL (ref 30.0–36.0)
MCV: 90 fl (ref 78.0–100.0)
MONOS PCT: 8.1 % (ref 3.0–12.0)
Monocytes Absolute: 0.6 10*3/uL (ref 0.1–1.0)
NEUTROS ABS: 4.4 10*3/uL (ref 1.4–7.7)
Neutrophils Relative %: 60 % (ref 43.0–77.0)
Platelets: 243 10*3/uL (ref 150.0–400.0)
RBC: 4.54 Mil/uL (ref 3.87–5.11)
RDW: 12.9 % (ref 11.5–15.5)
WBC: 7.4 10*3/uL (ref 4.0–10.5)

## 2015-01-17 LAB — COMPREHENSIVE METABOLIC PANEL
ALT: 25 U/L (ref 0–35)
AST: 22 U/L (ref 0–37)
Albumin: 4.1 g/dL (ref 3.5–5.2)
Alkaline Phosphatase: 89 U/L (ref 39–117)
BILIRUBIN TOTAL: 0.3 mg/dL (ref 0.2–1.2)
BUN: 15 mg/dL (ref 6–23)
CO2: 29 meq/L (ref 19–32)
CREATININE: 0.88 mg/dL (ref 0.40–1.20)
Calcium: 10.3 mg/dL (ref 8.4–10.5)
Chloride: 100 mEq/L (ref 96–112)
GFR: 67.83 mL/min (ref >60.00)
GLUCOSE: 196 mg/dL — AB (ref 70–99)
POTASSIUM: 4.2 meq/L (ref 3.5–5.1)
Sodium: 137 mEq/L (ref 135–145)
Total Protein: 7.6 g/dL (ref 6.0–8.3)

## 2015-01-17 LAB — TSH: TSH: 1.42 u[IU]/mL (ref 0.35–4.50)

## 2015-01-17 LAB — HEMOGLOBIN A1C: Hgb A1c MFr Bld: 7.9 % — ABNORMAL HIGH (ref 4.6–6.5)

## 2015-01-17 NOTE — Patient Instructions (Addendum)
Limit your sodium (Salt) intake   Please check your hemoglobin A1c every 3 months    It is important that you exercise regularly, at least 20 minutes 3 to 4 times per week.  If you develop chest pain or shortness of breath seek  medical attention.  Return in 6 months for follow-up   Schedule your colonoscopy to help detect colon cancer.  Health Maintenance Adopting a healthy lifestyle and getting preventive care can go a long way to promote health and wellness. Talk with your health care provider about what schedule of regular examinations is right for you. This is a good chance for you to check in with your provider about disease prevention and staying healthy. In between checkups, there are plenty of things you can do on your own. Experts have done a lot of research about which lifestyle changes and preventive measures are most likely to keep you healthy. Ask your health care provider for more information. WEIGHT AND DIET  Eat a healthy diet  Be sure to include plenty of vegetables, fruits, low-fat dairy products, and lean protein.  Do not eat a lot of foods high in solid fats, added sugars, or salt.  Get regular exercise. This is one of the most important things you can do for your health.  Most adults should exercise for at least 150 minutes each week. The exercise should increase your heart rate and make you sweat (moderate-intensity exercise).  Most adults should also do strengthening exercises at least twice a week. This is in addition to the moderate-intensity exercise.  Maintain a healthy weight  Body mass index (BMI) is a measurement that can be used to identify possible weight problems. It estimates body fat based on height and weight. Your health care provider can help determine your BMI and help you achieve or maintain a healthy weight.  For females 80 years of age and older:   A BMI below 18.5 is considered underweight.  A BMI of 18.5 to 24.9 is normal.  A BMI of  25 to 29.9 is considered overweight.  A BMI of 30 and above is considered obese.  Watch levels of cholesterol and blood lipids  You should start having your blood tested for lipids and cholesterol at 69 years of age, then have this test every 5 years.  You may need to have your cholesterol levels checked more often if:  Your lipid or cholesterol levels are high.  You are older than 69 years of age.  You are at high risk for heart disease.  CANCER SCREENING   Lung Cancer  Lung cancer screening is recommended for adults 52-69 years old who are at high risk for lung cancer because of a history of smoking.  A yearly low-dose CT scan of the lungs is recommended for people who:  Currently smoke.  Have quit within the past 15 years.  Have at least a 30-pack-year history of smoking. A pack year is smoking an average of one pack of cigarettes a day for 1 year.  Yearly screening should continue until it has been 15 years since you quit.  Yearly screening should stop if you develop a health problem that would prevent you from having lung cancer treatment.  Breast Cancer  Practice breast self-awareness. This means understanding how your breasts normally appear and feel.  It also means doing regular breast self-exams. Let your health care provider know about any changes, no matter how small.  If you are in your 20s or 30s,  you should have a clinical breast exam (CBE) by a health care provider every 1-3 years as part of a regular health exam.  If you are 40 or older, have a CBE every year. Also consider having a breast X-ray (mammogram) every year.  If you have a family history of breast cancer, talk to your health care provider about genetic screening.  If you are at high risk for breast cancer, talk to your health care provider about having an MRI and a mammogram every year.  Breast cancer gene (BRCA) assessment is recommended for women who have family members with BRCA-related  cancers. BRCA-related cancers include:  Breast.  Ovarian.  Tubal.  Peritoneal cancers.  Results of the assessment will determine the need for genetic counseling and BRCA1 and BRCA2 testing. Cervical Cancer Routine pelvic examinations to screen for cervical cancer are no longer recommended for nonpregnant women who are considered low risk for cancer of the pelvic organs (ovaries, uterus, and vagina) and who do not have symptoms. A pelvic examination may be necessary if you have symptoms including those associated with pelvic infections. Ask your health care provider if a screening pelvic exam is right for you.   The Pap test is the screening test for cervical cancer for women who are considered at risk.  If you had a hysterectomy for a problem that was not cancer or a condition that could lead to cancer, then you no longer need Pap tests.  If you are older than 65 years, and you have had normal Pap tests for the past 10 years, you no longer need to have Pap tests.  If you have had past treatment for cervical cancer or a condition that could lead to cancer, you need Pap tests and screening for cancer for at least 20 years after your treatment.  If you no longer get a Pap test, assess your risk factors if they change (such as having a new sexual partner). This can affect whether you should start being screened again.  Some women have medical problems that increase their chance of getting cervical cancer. If this is the case for you, your health care provider may recommend more frequent screening and Pap tests.  The human papillomavirus (HPV) test is another test that may be used for cervical cancer screening. The HPV test looks for the virus that can cause cell changes in the cervix. The cells collected during the Pap test can be tested for HPV.  The HPV test can be used to screen women 20 years of age and older. Getting tested for HPV can extend the interval between normal Pap tests from  three to five years.  An HPV test also should be used to screen women of any age who have unclear Pap test results.  After 69 years of age, women should have HPV testing as often as Pap tests.  Colorectal Cancer  This type of cancer can be detected and often prevented.  Routine colorectal cancer screening usually begins at 69 years of age and continues through 69 years of age.  Your health care provider may recommend screening at an earlier age if you have risk factors for colon cancer.  Your health care provider may also recommend using home test kits to check for hidden blood in the stool.  A small camera at the end of a tube can be used to examine your colon directly (sigmoidoscopy or colonoscopy). This is done to check for the earliest forms of colorectal cancer.  Routine  screening usually begins at age 41.  Direct examination of the colon should be repeated every 5-10 years through 69 years of age. However, you may need to be screened more often if early forms of precancerous polyps or small growths are found. Skin Cancer  Check your skin from head to toe regularly.  Tell your health care provider about any new moles or changes in moles, especially if there is a change in a mole's shape or color.  Also tell your health care provider if you have a mole that is larger than the size of a pencil eraser.  Always use sunscreen. Apply sunscreen liberally and repeatedly throughout the day.  Protect yourself by wearing long sleeves, pants, a wide-brimmed hat, and sunglasses whenever you are outside. HEART DISEASE, DIABETES, AND HIGH BLOOD PRESSURE   Have your blood pressure checked at least every 1-2 years. High blood pressure causes heart disease and increases the risk of stroke.  If you are between 18 years and 80 years old, ask your health care provider if you should take aspirin to prevent strokes.  Have regular diabetes screenings. This involves taking a blood sample to check  your fasting blood sugar level.  If you are at a normal weight and have a low risk for diabetes, have this test once every three years after 69 years of age.  If you are overweight and have a high risk for diabetes, consider being tested at a younger age or more often. PREVENTING INFECTION  Hepatitis B  If you have a higher risk for hepatitis B, you should be screened for this virus. You are considered at high risk for hepatitis B if:  You were born in a country where hepatitis B is common. Ask your health care provider which countries are considered high risk.  Your parents were born in a high-risk country, and you have not been immunized against hepatitis B (hepatitis B vaccine).  You have HIV or AIDS.  You use needles to inject street drugs.  You live with someone who has hepatitis B.  You have had sex with someone who has hepatitis B.  You get hemodialysis treatment.  You take certain medicines for conditions, including cancer, organ transplantation, and autoimmune conditions. Hepatitis C  Blood testing is recommended for:  Everyone born from [birth year] through 1965.  Anyone with known risk factors for hepatitis C. Sexually transmitted infections (STIs)  You should be screened for sexually transmitted infections (STIs) including gonorrhea and chlamydia if:  You are sexually active and are younger than 69 years of age.  You are older than 69 years of age and your health care provider tells you that you are at risk for this type of infection.  Your sexual activity has changed since you were last screened and you are at an increased risk for chlamydia or gonorrhea. Ask your health care provider if you are at risk.  If you do not have HIV, but are at risk, it may be recommended that you take a prescription medicine daily to prevent HIV infection. This is called pre-exposure prophylaxis (PrEP). You are considered at risk if:  You are sexually active and do not regularly use  condoms or know the HIV status of your partner(s).  You take drugs by injection.  You are sexually active with a partner who has HIV. Talk with your health care provider about whether you are at high risk of being infected with HIV. If you choose to begin PrEP, you should first be  tested for HIV. You should then be tested every 3 months for as long as you are taking PrEP.  PREGNANCY   If you are premenopausal and you may become pregnant, ask your health care provider about preconception counseling.  If you may become pregnant, take 400 to 800 micrograms (mcg) of folic acid every day.  If you want to prevent pregnancy, talk to your health care provider about birth control (contraception). OSTEOPOROSIS AND MENOPAUSE   Osteoporosis is a disease in which the bones lose minerals and strength with aging. This can result in serious bone fractures. Your risk for osteoporosis can be identified using a bone density scan.  If you are 48 years of age or older, or if you are at risk for osteoporosis and fractures, ask your health care provider if you should be screened.  Ask your health care provider whether you should take a calcium or vitamin D supplement to lower your risk for osteoporosis.  Menopause may have certain physical symptoms and risks.  Hormone replacement therapy may reduce some of these symptoms and risks. Talk to your health care provider about whether hormone replacement therapy is right for you.  HOME CARE INSTRUCTIONS   Schedule regular health, dental, and eye exams.  Stay current with your immunizations.   Do not use any tobacco products including cigarettes, chewing tobacco, or electronic cigarettes.  If you are pregnant, do not drink alcohol.  If you are breastfeeding, limit how much and how often you drink alcohol.  Limit alcohol intake to no more than 1 drink per day for nonpregnant women. One drink equals 12 ounces of beer, 5 ounces of wine, or 1 ounces of hard  liquor.  Do not use street drugs.  Do not share needles.  Ask your health care provider for help if you need support or information about quitting drugs.  Tell your health care provider if you often feel depressed.  Tell your health care provider if you have ever been abused or do not feel safe at home. Document Released: 03/15/2011 Document Revised: 01/14/2014 Document Reviewed: 08/01/2013 [facility] Patient Information 2015 [place], [place]. This information is not intended to replace advice given to you by your health care provider. Make sure you discuss any questions you have with your health care provider.

## 2015-01-17 NOTE — Progress Notes (Signed)
Pre visit review using our clinic review tool, if applicable. No additional management support is needed unless otherwise documented below in the visit note. 

## 2015-01-17 NOTE — Progress Notes (Signed)
Subjective:    Patient ID: Laurie [name], female    DOB: [birth year], 69 y.o.   MRN: [id number]  HPI  69 year old patient who is seen today for a preventive health examination.  She is followed by cardiology and endocrinology  Past Medical History  Diagnosis Date  . Bell's palsy 01/05/2010  . Multinodular goiter   . Hepatitis   . Thyromegaly     mild  . 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 Hyperlipidemia     Intolerance to several statins   . Hypertensive heart disease without CHF   . HIstory of Bell's palsy     October 2012   . Obesity (BMI 30-39.9)   . Peripheral neuropathy   . Sleep apnea   . CAD (coronary artery disease)     Cath  07/21/1999  mild ostial, 80% stenosis proximal LAD, 90% stenosis proximal Diag 1, 95% stenosis proximal OM 1, 80% stenosis proximal RCA, 95% stenosis mid RCA    CABG 07/22/99  LIMA to LAD, SVG to dx, SVG to OM, SVG to PDA  Dr. [name]   Cardiolite 2012 no ischemia EF 67%     History   Social History  . Marital Status: Married    Spouse Name: N/A  . Number of Children: 2  . Years of Education: N/A   Occupational History  .     Social History Main Topics  . Smoking status: Never Smoker   . Smokeless tobacco: Never Used  . Alcohol Use: No  . Drug Use: No  . Sexual Activity: No   Other Topics Concern  . Not on file   Social History Narrative   Does not work outside the home    Past Surgical History  Procedure Laterality Date  . Coronary artery bypass graft  2000  . Tubal ligation    . Biopsy thyroid  05/2010    percutaneous  . Foot surgery    . Carpal tunnel release    . Dental surgery    . Temporary pacemaker insertion N/A 10/22/2012    Procedure: TEMPORARY PACEMAKER INSERTION;  Surgeon: Troy Sine, MD;  Location: [facility] CATH LAB;  Service: Cardiovascular;  Laterality: N/A;  . Left heart catheterization with coronary angiogram N/A 10/23/2012    Procedure: LEFT HEART  CATHETERIZATION WITH CORONARY ANGIOGRAM;  Surgeon: Jacolyn Reedy, MD;  Location: [facility] CATH LAB;  Service: Cardiovascular;  Laterality: N/A;  . Permanent pacemaker insertion N/A 10/23/2012    Procedure: PERMANENT PACEMAKER INSERTION;  Surgeon: Deboraha Sprang, MD;  Location: [facility] CATH LAB;  Service: Cardiovascular;  Laterality: N/A;  . Lead revision N/A 10/25/2012    Procedure: LEAD REVISION;  Surgeon: Evans Lance, MD;  Location: [facility] CATH LAB;  Service: Cardiovascular;  Laterality: N/A;  . Eye surgery    . Cataract extraction w/ intraocular lens implant Left 12/16/2014  . Cataract extraction w/ intraocular lens  implant, bilateral Right 01/06/2015    Family History  Problem Relation Age of Onset  . Diabetes Father   . Heart disease Father   . Diabetes Other     Siblings and 2 children  . Heart disease Other     CAD  . Diabetes Mother     Allergies  Allergen Reactions  . Statins     Intolerance to several  . Tricor [Fenofibrate]     Leg pain    Current Outpatient Prescriptions on File Prior to Visit  Medication  Sig Dispense Refill  . Alcohol Swabs (B-D SINGLE USE SWABS REGULAR) PADS Use 8 daily for testing blood sugar and insulin injections. 800 each 1  . AMBULATORY NON FORMULARY MEDICATION Diabetic Nutrition Pack with [place], Chromium and Vitamin D3 1500 IU    . AMBULATORY NON FORMULARY MEDICATION Omega Q Plus - Take 1 capsule once daily    . aspirin EC 81 MG EC tablet Take 1 tablet (81 mg total) by mouth daily.    [name] b complex vitamins tablet Take 1 tablet by mouth daily.    . B-D ULTRAFINE III SHORT PEN 31G X 8 MM MISC     . beta carotene w/minerals (OCUVITE) tablet Take 1 tablet by mouth daily.    . Blood Glucose Calibration (ACCU-CHEK SMARTVIEW CONTROL) LIQD Use to calibrate blood glucose meter. 3 each 1  . Cholecalciferol (VITAMIN D3) 50000 UNITS CAPS Take 1 tablet by mouth daily.    [name] ezetimibe (ZETIA) 10 MG tablet Take 1 tablet (10 mg total) by mouth daily. 90 tablet 3  .  furosemide (LASIX) 40 MG tablet TAKE 1 AND 1/2 TABLETS EVERY DAY AS NEEDED 135 tablet 2  . glucose blood (PRODIGY NO CODING BLOOD GLUC) test strip Check blood sugar 4 times per day. 400 each 1  . insulin NPH Human (HUMULIN N,NOVOLIN N) 100 UNIT/ML injection Inject 0.4 mLs (40 Units total) into the skin at bedtime. 20 mL 11  . insulin regular (NOVOLIN R,HUMULIN R) 100 units/mL injection 150 units with breakfast, 270 units with lunch, 340 units with evening meal,  also 100-unit syringes 10/day, 260 mL 11  . ketoconazole (NIZORAL) 2 % cream APPLY TOPICALLY AS NEEDED 15 g 2  . lisinopril (PRINIVIL,ZESTRIL) 40 MG tablet TAKE ONE TABLET BY MOUTH ONCE DAILY 90 tablet 3  . loratadine (CLARITIN) 10 MG tablet Take 10 mg by mouth daily.    . potassium chloride SA (KLOR-CON M20) 20 MEQ tablet Take 1 tablet (20 mEq total) by mouth 2 (two) times daily. 180 tablet 3  . Probiotic Product (PROBIOTIC DAILY) CAPS Take 1 capsule by mouth daily.    . sodium chloride (OCEAN) 0.65 % nasal spray Place 1 spray into the nose as needed for congestion.    . verapamil (VERELAN PM) 120 MG 24 hr capsule TAKE ONE TABLET BY MOUTH ONCE DAILY 30 capsule 10   No current facility-administered medications on file prior to visit.    Pulse 95  Temp(Src) 98.5 F (36.9 C) (Oral)  Resp 20  Ht 5' 2.5" (1.588 m)  Wt 214 lb (97.07 kg)  BMI 38.49 kg/m2  SpO2 95%      Allergies (verified): No Known Drug Allergies  Past History:  Past Medical History: Allergic rhinitis Coronary artery disease Diabetes mellitus, type II Hypertension Hyperlipidemia elevated transaminases Bell's palsy, April 2011 mild thyromegaly  Past Surgical History: Coronary artery bypass graft 2000 Tubal ligation Cardiolite stress test July 2007, 04/2009 flexible sigmoidoscopy 2003 colonoscopy 2003  percutaneous thyroid, biopsy September 2011  Family History:  father died age 6, and  mother died age 76, complications of diabetes, coronary  artery disease 4 sisters, positive diabetes, coronary artery disease,    Social History:   Married Never Smoked 2 children does not work outside the home  1. Risk factors, based on past  M,S,F history.  Patient has known coronary artery disease status post CABG.  Cardio vascular risk factors include hypertension, dyslipidemia and diabetes   2.  Physical activities: Sedentary activities, mainly.  No history  of exertional chest pain, no regular exercise regimen except stationary bike 3 times per week  3.  Depression/mood: No history depression or mood disorder  4.  Hearing: No deficits  5.  ADL's: Independent in all aspects of daily living   6.  Fall risk: Low  7.  Home safety: No problems identified   8.  Height weight, and visual acuity; height and weight stable no change in visual acuity  9.  Counseling: Heart healthy diet, weight loss or regular exercise regimen all discussed and encouraged   10. Lab orders based on risk factors: Will check a hemoglobin A1c and screening lab  11. Referral : Follow-up endocrinology and cardiology.  Annual eye examination recommended.  Patient was seen by ophthalmology 2 months ago   12. Care plan: Annual eye examination.  Heart healthy diet, exercise weight loss  13. Cognitive assessment: Alert in order with normal affect.  No cognitive dysfunction  14. Screening: The patient will have annual clinical examinations.  The patient will be followed by cardiology and endocrinology.  The patient is scheduled for follow-up colonoscopy.  Annual mammograms discussed  15. Provider List Update: Primary care cardiology, endocrinology, ophthalmology.    Review of Systems  Constitutional: Negative.   HENT: Negative for congestion, dental problem, hearing loss, rhinorrhea, sinus pressure, sore throat and tinnitus.   Eyes: Negative for pain, discharge and visual disturbance.  Respiratory: Negative for cough and shortness of breath.     Cardiovascular: Negative for chest pain, palpitations and leg swelling.  Gastrointestinal: Negative for nausea, vomiting, abdominal pain, diarrhea, constipation, blood in stool and abdominal distention.  Genitourinary: Negative for dysuria, urgency, frequency, hematuria, flank pain, vaginal bleeding, vaginal discharge, difficulty urinating, vaginal pain and pelvic pain.  Musculoskeletal: Negative for joint swelling, arthralgias and gait problem.  Skin: Negative for rash.  Neurological: Negative for dizziness, syncope, speech difficulty, weakness, numbness and headaches.  Hematological: Negative for adenopathy.  Psychiatric/Behavioral: Negative for behavioral problems, dysphoric mood and agitation. The patient is not nervous/anxious.        Objective:   Physical Exam  Constitutional: She is oriented to person, place, and time. She appears well-developed and well-nourished.  HENT:  Head: Normocephalic and atraumatic.  Right Ear: External ear normal.  Left Ear: External ear normal.  Mouth/Throat: Oropharynx is clear and moist.  Pharyngeal crowding  Eyes: Conjunctivae and EOM are normal.  Neck: Normal range of motion. Neck supple. No JVD present. No thyromegaly present.  Cardiovascular: Normal rate, regular rhythm and intact distal pulses.   Murmur heard. Dorsalis pedis pulses full Posterior tibial pulses faint  Pulmonary/Chest: Effort normal and breath sounds normal. She has no wheezes. She has no rales.  Sternotomy scar  Abdominal: Soft. Bowel sounds are normal. She exhibits no distension and no mass. There is no tenderness. There is no rebound and no guarding.  Genitourinary: Vagina normal.  Musculoskeletal: Normal range of motion. She exhibits no edema or tenderness.  Neurological: She is alert and oriented to person, place, and time. She has normal reflexes. No cranial nerve deficit. She exhibits normal muscle tone. Coordination normal.  Skin: Skin is warm and dry. No rash noted.   Psychiatric: She has a normal mood and affect. Her behavior is normal.          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  Follow-up endocrinology.  Will check hemoglobin A1c, which has been consistently greater than 8.  Patient continues to have some occasional hypoglycemia  in spite of de-escalation of insulin therapy Hypertension Coronary artery disease, stable.  Follow-up cardiology Dyslipidemia.  Consider PCSK9 inh.  Follow cardiology

## 2015-01-21 ENCOUNTER — Encounter: Payer: Self-pay | Admitting: Internal Medicine

## 2015-02-12 ENCOUNTER — Telehealth: Payer: Self-pay | Admitting: Internal Medicine

## 2015-02-12 ENCOUNTER — Telehealth: Payer: Self-pay

## 2015-02-12 NOTE — Telephone Encounter (Signed)
appt with [facility] 03/20/15 for mammogram

## 2015-02-12 NOTE — Telephone Encounter (Signed)
Updated in health maintenance

## 2015-02-12 NOTE — Telephone Encounter (Signed)
Left mess. on cell to call back concerning overdue mammogram.

## 2015-02-13 NOTE — Telephone Encounter (Signed)
error 

## 2015-02-19 ENCOUNTER — Telehealth: Payer: Self-pay | Admitting: Internal Medicine

## 2015-02-19 DIAGNOSIS — Z1231 Encounter for screening mammogram for malignant neoplasm of breast: Secondary | ICD-10-CM

## 2015-02-19 NOTE — Telephone Encounter (Signed)
Order for Mammogram done.

## 2015-02-19 NOTE — Telephone Encounter (Signed)
Pt need a referral for a mammogram her appt is 03/30/15 at [facility]

## 2015-02-28 DIAGNOSIS — H01002 Unspecified blepharitis right lower eyelid: Secondary | ICD-10-CM | POA: Diagnosis not present

## 2015-02-28 DIAGNOSIS — H01005 Unspecified blepharitis left lower eyelid: Secondary | ICD-10-CM | POA: Diagnosis not present

## 2015-03-18 ENCOUNTER — Encounter: Payer: Self-pay | Admitting: Adult Health

## 2015-03-18 ENCOUNTER — Ambulatory Visit (INDEPENDENT_AMBULATORY_CARE_PROVIDER_SITE_OTHER): Payer: Commercial Managed Care - HMO | Admitting: Adult Health

## 2015-03-18 VITALS — BP 120/60 | HR 96 | Temp 99.2°F | Ht 62.5 in | Wt 212.9 lb

## 2015-03-18 DIAGNOSIS — J069 Acute upper respiratory infection, unspecified: Secondary | ICD-10-CM

## 2015-03-18 DIAGNOSIS — R05 Cough: Secondary | ICD-10-CM

## 2015-03-18 DIAGNOSIS — R059 Cough, unspecified: Secondary | ICD-10-CM

## 2015-03-18 MED ORDER — BENZONATATE 200 MG PO CAPS: 200.0000 mg | ORAL_CAPSULE | Freq: Three times a day (TID) | ORAL | Status: DC | PRN

## 2015-03-18 NOTE — Progress Notes (Signed)
Subjective:    Patient ID: Laurie [name], female    DOB: [birth year], 69 y.o.   MRN: [id number]  HPI  Laurie [name] presents to the office for cough, chest congestion, sore/scratchy throat with associated headache since Friday. She tried Mucinex from Friday till Monday without much relief.   Low grade fever 69F  Denies n/v/d  Review of Systems  HENT: Positive for congestion and sore throat. Negative for ear discharge, ear pain, postnasal drip, rhinorrhea, sinus pressure, tinnitus and trouble swallowing.   Eyes: Negative.   Respiratory: Positive for cough. Negative for chest tightness, shortness of breath, wheezing and stridor.   Cardiovascular: Negative.   Gastrointestinal: Negative.   Musculoskeletal: Negative.   Skin: Negative.   Neurological: Negative.   All other systems reviewed and are negative.  Past Medical History  Diagnosis Date  . Bell's palsy 01/05/2010  . Multinodular goiter   . Hepatitis   . Thyromegaly     mild  . 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 Hyperlipidemia     Intolerance to several statins   . Hypertensive heart disease without CHF   . HIstory of Bell's palsy     October 2012   . Obesity (BMI 30-39.9)   . Peripheral neuropathy   . Sleep apnea   . CAD (coronary artery disease)     Cath  07/21/1999  mild ostial, 80% stenosis proximal LAD, 90% stenosis proximal Diag 1, 95% stenosis proximal OM 1, 80% stenosis proximal RCA, 95% stenosis mid RCA    CABG 07/22/99  LIMA to LAD, SVG to dx, SVG to OM, SVG to PDA  Dr. [name]   Cardiolite 2012 no ischemia EF 67%     History   Social History  . Marital Status: Married    Spouse Name: N/A  . Number of Children: 2  . Years of Education: N/A   Occupational History  .     Social History Main Topics  . Smoking status: Never Smoker   . Smokeless tobacco: Never Used  . Alcohol Use: No  . Drug Use: No  . Sexual Activity: No   Other Topics Concern   . Not on file   Social History Narrative   Does not work outside the home    Past Surgical History  Procedure Laterality Date  . Coronary artery bypass graft  2000  . Tubal ligation    . Biopsy thyroid  05/2010    percutaneous  . Foot surgery    . Carpal tunnel release    . Dental surgery    . Temporary pacemaker insertion N/A 10/22/2012    Procedure: TEMPORARY PACEMAKER INSERTION;  Surgeon: Troy Sine, MD;  Location: [facility] CATH LAB;  Service: Cardiovascular;  Laterality: N/A;  . Left heart catheterization with coronary angiogram N/A 10/23/2012    Procedure: LEFT HEART CATHETERIZATION WITH CORONARY ANGIOGRAM;  Surgeon: Jacolyn Reedy, MD;  Location: [facility] CATH LAB;  Service: Cardiovascular;  Laterality: N/A;  . Permanent pacemaker insertion N/A 10/23/2012    Procedure: PERMANENT PACEMAKER INSERTION;  Surgeon: Deboraha Sprang, MD;  Location: [facility] CATH LAB;  Service: Cardiovascular;  Laterality: N/A;  . Lead revision N/A 10/25/2012    Procedure: LEAD REVISION;  Surgeon: Evans Lance, MD;  Location: [facility] CATH LAB;  Service: Cardiovascular;  Laterality: N/A;  . Eye surgery    . Cataract extraction w/ intraocular lens implant Left 12/16/2014  . Cataract extraction w/ intraocular lens  implant, bilateral Right 01/06/2015    Family History  Problem Relation Age of Onset  . Diabetes Father   . Heart disease Father   . Diabetes Other     Siblings and 2 children  . Heart disease Other     CAD  . Diabetes Mother     Allergies  Allergen Reactions  . Statins     Intolerance to several  . Tricor [Fenofibrate]     Leg pain    Current Outpatient Prescriptions on File Prior to Visit  Medication Sig Dispense Refill  . ACCU-CHEK FASTCLIX LANCETS MISC USE TO CHECK BLOOD SUGAR 4 TIMES A DAY    . Alcohol Swabs (B-D SINGLE USE SWABS REGULAR) PADS Use 8 daily for testing blood sugar and insulin injections. 800 each 1  . AMBULATORY NON FORMULARY MEDICATION Diabetic Nutrition Pack with Cinannon,  Chromium and Vitamin D3 1500 IU    . AMBULATORY NON FORMULARY MEDICATION Omega Q Plus - Take 1 capsule once daily    . aspirin EC 81 MG EC tablet Take 1 tablet (81 mg total) by mouth daily.    [name] b complex vitamins tablet Take 1 tablet by mouth daily.    . B-D ULTRAFINE III SHORT PEN 31G X 8 MM MISC     . BESIVANCE 0.6 % SUSP Place 1 drop into both eyes 3 (three) times daily.     . beta carotene w/minerals (OCUVITE) tablet Take 1 tablet by mouth daily.    . Blood Glucose Calibration (ACCU-CHEK SMARTVIEW CONTROL) LIQD Use to calibrate blood glucose meter. 3 each 1  . Cholecalciferol (VITAMIN D3) 50000 UNITS CAPS Take 1 tablet by mouth daily.    . DUREZOL 0.05 % EMUL     . ezetimibe (ZETIA) 10 MG tablet Take 1 tablet (10 mg total) by mouth daily. 90 tablet 3  . furosemide (LASIX) 40 MG tablet TAKE 1 AND 1/2 TABLETS EVERY DAY AS NEEDED 135 tablet 2  . glucose blood (PRODIGY NO CODING BLOOD GLUC) test strip Check blood sugar 4 times per day. 400 each 1  . ILEVRO 0.3 % ophthalmic suspension     . insulin NPH Human (HUMULIN N,NOVOLIN N) 100 UNIT/ML injection Inject 0.4 mLs (40 Units total) into the skin at bedtime. 20 mL 11  . insulin regular (NOVOLIN R,HUMULIN R) 100 units/mL injection 150 units with breakfast, 270 units with lunch, 340 units with evening meal,  also 100-unit syringes 10/day, 260 mL 11  . ketoconazole (NIZORAL) 2 % cream APPLY TOPICALLY AS NEEDED 15 g 2  . lisinopril (PRINIVIL,ZESTRIL) 40 MG tablet TAKE ONE TABLET BY MOUTH ONCE DAILY 90 tablet 3  . loratadine (CLARITIN) 10 MG tablet Take 10 mg by mouth daily.    . potassium chloride SA (KLOR-CON M20) 20 MEQ tablet Take 1 tablet (20 mEq total) by mouth 2 (two) times daily. 180 tablet 3  . Probiotic Product (PROBIOTIC DAILY) CAPS Take 1 capsule by mouth daily.    [name] RELION INSULIN SYR 1CC/30G 30G X 5/16" 1 ML MISC     . sodium chloride (OCEAN) 0.65 % nasal spray Place 1 spray into the nose as needed for congestion.    [name] tobramycin  (TOBREX) 0.3 % ophthalmic solution     . verapamil (VERELAN PM) 120 MG 24 hr capsule TAKE ONE TABLET BY MOUTH ONCE DAILY 30 capsule 10   No current facility-administered medications on file prior to visit.    BP 120/60 mmHg  Pulse 96  Temp(Src) 99.2  F (37.3 C) (Oral)  Ht 5' 2.5" (1.588 m)  Wt 212 lb 14.4 oz (96.571 kg)  BMI 38.30 kg/m2  SpO2 94%       Objective:   Physical Exam  Constitutional: She is oriented to person, place, and time. She appears well-developed and well-nourished.  HENT:  Head: Normocephalic and atraumatic.  Right Ear: External ear normal.  Left Ear: External ear normal.  Nose: Nose normal.  Mouth/Throat: Oropharynx is clear and moist. No oropharyngeal exudate.  Eyes: Conjunctivae and EOM are normal. Pupils are equal, round, and reactive to light. Left eye exhibits discharge.  Cardiovascular: Normal rate, regular rhythm, normal heart sounds and intact distal pulses.  Exam reveals no gallop and no friction rub.   No murmur heard. Pulmonary/Chest: Effort normal and breath sounds normal. No respiratory distress. She has no wheezes. She has no rales. She exhibits no tenderness.  Lymphadenopathy:    She has cervical adenopathy (posterior auricular).  Neurological: She is alert and oriented to person, place, and time.  Skin: Skin is warm and dry. No rash noted. She is not diaphoretic. No erythema. No pallor.  Psychiatric: She has a normal mood and affect. Her behavior is normal. Judgment and thought content normal.  Nursing note and vitals reviewed.      Assessment & Plan:  1. Cough - benzonatate (TESSALON) 200 MG capsule; Take 1 capsule (200 mg total) by mouth 3 (three) times daily as needed for cough.  Dispense: 20 capsule; Refill: 0  - OTC Delsym for cough - Follow up if no improvement in the next 2-3 days.   2. Acute upper respiratory infection -likely viral in nature - Continue to take Claritin and start using Flonase.  - Follow up if no  improvement in the next 2-3 days.

## 2015-03-18 NOTE — Patient Instructions (Signed)
It was great meeting you today!   I have sent a prescription to the pharmacy for Tessalon Pearls, please take this with over the counter Delsym. You can also use a teaspoon or two of honey to help with your throat irritation. If you do not notice any improvement in the next 2-3 days, please let me know.

## 2015-03-18 NOTE — Progress Notes (Signed)
Pre visit review using our clinic review tool, if applicable. No additional management support is needed unless otherwise documented below in the visit note. 

## 2015-03-25 DIAGNOSIS — Z1231 Encounter for screening mammogram for malignant neoplasm of breast: Secondary | ICD-10-CM | POA: Diagnosis not present

## 2015-03-25 LAB — HM MAMMOGRAPHY

## 2015-04-01 ENCOUNTER — Encounter: Payer: Self-pay | Admitting: Internal Medicine

## 2015-04-04 ENCOUNTER — Encounter: Payer: Commercial Managed Care - HMO | Admitting: Internal Medicine

## 2015-04-04 DIAGNOSIS — E668 Other obesity: Secondary | ICD-10-CM | POA: Diagnosis not present

## 2015-04-04 DIAGNOSIS — R609 Edema, unspecified: Secondary | ICD-10-CM | POA: Diagnosis not present

## 2015-04-04 DIAGNOSIS — E1121 Type 2 diabetes mellitus with diabetic nephropathy: Secondary | ICD-10-CM | POA: Diagnosis not present

## 2015-04-04 DIAGNOSIS — E785 Hyperlipidemia, unspecified: Secondary | ICD-10-CM | POA: Diagnosis not present

## 2015-04-04 DIAGNOSIS — I119 Hypertensive heart disease without heart failure: Secondary | ICD-10-CM | POA: Diagnosis not present

## 2015-04-04 DIAGNOSIS — G4733 Obstructive sleep apnea (adult) (pediatric): Secondary | ICD-10-CM | POA: Diagnosis not present

## 2015-04-04 DIAGNOSIS — Z951 Presence of aortocoronary bypass graft: Secondary | ICD-10-CM | POA: Diagnosis not present

## 2015-04-04 DIAGNOSIS — I2510 Atherosclerotic heart disease of native coronary artery without angina pectoris: Secondary | ICD-10-CM | POA: Diagnosis not present

## 2015-04-04 DIAGNOSIS — R000 Tachycardia, unspecified: Secondary | ICD-10-CM | POA: Diagnosis not present

## 2015-04-09 DIAGNOSIS — R922 Inconclusive mammogram: Secondary | ICD-10-CM | POA: Diagnosis not present

## 2015-04-09 DIAGNOSIS — R928 Other abnormal and inconclusive findings on diagnostic imaging of breast: Secondary | ICD-10-CM | POA: Diagnosis not present

## 2015-04-09 LAB — HM MAMMOGRAPHY: HM Mammogram: ABNORMAL

## 2015-04-10 ENCOUNTER — Encounter: Payer: Self-pay | Admitting: Internal Medicine

## 2015-04-15 ENCOUNTER — Encounter: Payer: Self-pay | Admitting: Internal Medicine

## 2015-04-23 ENCOUNTER — Other Ambulatory Visit: Payer: Self-pay | Admitting: Internal Medicine

## 2015-05-06 DIAGNOSIS — G4733 Obstructive sleep apnea (adult) (pediatric): Secondary | ICD-10-CM | POA: Diagnosis not present

## 2015-05-06 DIAGNOSIS — I119 Hypertensive heart disease without heart failure: Secondary | ICD-10-CM | POA: Diagnosis not present

## 2015-05-06 DIAGNOSIS — E668 Other obesity: Secondary | ICD-10-CM | POA: Diagnosis not present

## 2015-05-06 DIAGNOSIS — Z951 Presence of aortocoronary bypass graft: Secondary | ICD-10-CM | POA: Diagnosis not present

## 2015-05-06 DIAGNOSIS — I2510 Atherosclerotic heart disease of native coronary artery without angina pectoris: Secondary | ICD-10-CM | POA: Diagnosis not present

## 2015-05-06 DIAGNOSIS — E785 Hyperlipidemia, unspecified: Secondary | ICD-10-CM | POA: Diagnosis not present

## 2015-05-06 DIAGNOSIS — Z950 Presence of cardiac pacemaker: Secondary | ICD-10-CM | POA: Diagnosis not present

## 2015-05-06 DIAGNOSIS — E1121 Type 2 diabetes mellitus with diabetic nephropathy: Secondary | ICD-10-CM | POA: Diagnosis not present

## 2015-05-06 DIAGNOSIS — R000 Tachycardia, unspecified: Secondary | ICD-10-CM | POA: Diagnosis not present

## 2015-05-08 ENCOUNTER — Ambulatory Visit (AMBULATORY_SURGERY_CENTER): Payer: Self-pay

## 2015-05-08 VITALS — Ht 62.0 in | Wt 208.0 lb

## 2015-05-08 DIAGNOSIS — Z1211 Encounter for screening for malignant neoplasm of colon: Secondary | ICD-10-CM

## 2015-05-08 NOTE — Progress Notes (Signed)
No allergies to eggs or soy No home oxygen No diet/weight loss meds No past problems with anesthesia (hypotensive after foot surgery-unknown if had a nerve block)  Has email  Emmi instructions given for colonoscopy

## 2015-05-22 ENCOUNTER — Encounter: Payer: Self-pay | Admitting: Internal Medicine

## 2015-05-22 ENCOUNTER — Ambulatory Visit (AMBULATORY_SURGERY_CENTER): Payer: Commercial Managed Care - HMO | Admitting: Internal Medicine

## 2015-05-22 VITALS — BP 117/55 | HR 70 | Temp 99.3°F | Resp 89 | Ht 62.5 in | Wt 212.0 lb

## 2015-05-22 DIAGNOSIS — D122 Benign neoplasm of ascending colon: Secondary | ICD-10-CM | POA: Diagnosis not present

## 2015-05-22 DIAGNOSIS — Z1211 Encounter for screening for malignant neoplasm of colon: Secondary | ICD-10-CM | POA: Diagnosis present

## 2015-05-22 DIAGNOSIS — I2510 Atherosclerotic heart disease of native coronary artery without angina pectoris: Secondary | ICD-10-CM | POA: Diagnosis not present

## 2015-05-22 DIAGNOSIS — K635 Polyp of colon: Secondary | ICD-10-CM | POA: Diagnosis not present

## 2015-05-22 DIAGNOSIS — Z951 Presence of aortocoronary bypass graft: Secondary | ICD-10-CM | POA: Diagnosis not present

## 2015-05-22 DIAGNOSIS — E119 Type 2 diabetes mellitus without complications: Secondary | ICD-10-CM | POA: Diagnosis not present

## 2015-05-22 LAB — GLUCOSE, CAPILLARY
Glucose-Capillary: 90 mg/dL (ref 65–99)
Glucose-Capillary: 100 mg/dL — ABNORMAL HIGH (ref 65–99)

## 2015-05-22 MED ORDER — SODIUM CHLORIDE 0.9 % IV SOLN: 500.0000 mL | INTRAVENOUS | Status: DC

## 2015-05-22 NOTE — Patient Instructions (Addendum)
I found and removed 2 tiny polyps. You also have a condition called diverticulosis - common and not usually a problem. Please read the handout provided.  I will let you know pathology results and when to have another routine colonoscopy by mail.  I appreciate the opportunity to care for you. Gatha Mayer, MD, FACG YOU HAD AN ENDOSCOPIC PROCEDURE TODAY AT [facility]:   Refer to the procedure report that was given to you for any specific questions about what was found during the examination.  If the procedure report does not answer your questions, please call your gastroenterologist to clarify.  If you requested that your care partner not be given the details of your procedure findings, then the procedure report has been included in a sealed envelope for you to review at your convenience later.  YOU SHOULD EXPECT: Some feelings of bloating in the abdomen. Passage of more gas than usual.  Walking can help get rid of the air that was put into your GI tract during the procedure and reduce the bloating. If you had a lower endoscopy (such as a colonoscopy or flexible sigmoidoscopy) you may notice spotting of blood in your stool or on the toilet paper. If you underwent a bowel prep for your procedure, you may not have a normal bowel movement for a few days.  Please Note:  You might notice some irritation and congestion in your nose or some drainage.  This is from the oxygen used during your procedure.  There is no need for concern and it should clear up in a day or so.  SYMPTOMS TO REPORT IMMEDIATELY:   Following lower endoscopy (colonoscopy or flexible sigmoidoscopy):  Excessive amounts of blood in the stool  Significant tenderness or worsening of abdominal pains  Swelling of the abdomen that is new, acute  Fever of 100F or higher   For urgent or emergent issues, a gastroenterologist can be reached at any hour by calling [phone].   DIET: Your first meal  following the procedure should be a small meal and then it is ok to progress to your normal diet. Heavy or fried foods are harder to digest and may make you feel nauseous or bloated.  Likewise, meals heavy in dairy and vegetables can increase bloating.  Drink plenty of fluids but you should avoid alcoholic beverages for 24 hours.  ACTIVITY:  You should plan to take it easy for the rest of today and you should NOT DRIVE or use heavy machinery until tomorrow (because of the sedation medicines used during the test).    FOLLOW UP: Our staff will call the number listed on your records the next business day following your procedure to check on you and address any questions or concerns that you may have regarding the information given to you following your procedure. If we do not reach you, we will leave a message.  However, if you are feeling well and you are not experiencing any problems, there is no need to return our call.  We will assume that you have returned to your regular daily activities without incident.  If any biopsies were taken you will be contacted by phone or by letter within the next 1-3 weeks.  Please call us at [phone] if you have not heard about the biopsies in 3 weeks.    SIGNATURES/CONFIDENTIALITY: You and/or your care partner have signed paperwork which will be entered into your electronic medical record.  These signatures attest to the  fact that that the information above on your After Visit Summary has been reviewed and is understood.  Full responsibility of the confidentiality of this discharge information lies with you and/or your care-partner.

## 2015-05-22 NOTE — Progress Notes (Signed)
  [facility] Post-op Note  Patient: Laurie [name]  Procedure(s) Performed: colonoscopy  Patient Location: [facility] - Recovery Area  Anesthesia Type: Deep Sedation/Propofol  Level of Consciousness: awake, oriented and patient cooperative  Airway and Oxygen Therapy: Patient Spontanous Breathing  Post-op Pain: none  Post-op Assessment:  Post-op Vital signs reviewed, Patient's Cardiovascular Status Stable, Respiratory Function Stable, Patent Airway, No signs of Nausea or vomiting and Pain level controlled  Post-op Vital Signs: Reviewed and stable  Complications: No apparent anesthesia complications  Adysson Revelle E 9:12 AM

## 2015-05-22 NOTE — Op Note (Signed)
[facility]  [address] [place], [address]   COLONOSCOPY PROCEDURE REPORT  PATIENT: Laurie [name], Laurie [name]  MR#: [id number] BIRTHDATE: 1945/11/26 , 68  yrs. old GENDER: female ENDOSCOPIST: Gatha Mayer, MD, [facility] PROCEDURE DATE:  05/22/2015 PROCEDURE:   Colonoscopy, screening, Colonoscopy with biopsy, and Colonoscopy with snare polypectomy First Screening Colonoscopy - Avg.  risk and is 50 yrs.  old or older Yes.  Prior Negative Screening - Now for repeat screening. N/A  History of Adenoma - Now for follow-up colonoscopy & has been > or = to 3 yrs.  N/A  Polyps removed today? Yes ASA CLASS:   Class III INDICATIONS:Screening for colonic neoplasia and Colorectal Neoplasm Risk Assessment for this procedure is average risk. MEDICATIONS: Propofol 200 mg IV and Monitored anesthesia care  DESCRIPTION OF PROCEDURE:   After the risks benefits and alternatives of the procedure were thoroughly explained, informed consent was obtained.  The digital rectal exam revealed no abnormalities of the rectum.   The [id number]  endoscope was introduced through the anus and advanced to the cecum, which was identified by both the appendix and ileocecal valve. No adverse events experienced.   The quality of the prep was excellent. (MiraLax was used)  The instrument was then slowly withdrawn as the colon was fully examined. Estimated blood loss is zero unless otherwise noted in this procedure report.      COLON FINDINGS: Two flat polyps ranging from 2 to 51mm in size were found in the ascending colon.  Polypectomies were performed with a cold snare (6mm)and with cold forceps (36mm).  The resection was complete, the polyp tissue was completely retrieved and sent to histology.   There was diverticulosis noted in the sigmoid colon. The examination was otherwise normal.  Retroflexed views revealed no abnormalities. The time to cecum = 9.5 Withdrawal time = 13.9 The scope was  withdrawn and the procedure completed. COMPLICATIONS: There were no immediate complications.  ENDOSCOPIC IMPRESSION: 1.   Two flat polyps ranging from 2 to 21mm in size were found in the ascending colon; polypectomies were performed with a cold snare and with cold forceps 2.   Diverticulosis was noted in the sigmoid colon 3.   The examination was otherwise normal - excellent prep  RECOMMENDATIONS: Timing of repeat colonoscopy will be determined by pathology findings.  eSigned:  Gatha Mayer, MD, [facility] 05/22/2015 9:18 AM   cc: Laurie Kaufmann, MD and The Patient

## 2015-05-22 NOTE — Progress Notes (Signed)
Called to room to assist during endoscopic procedure.  Patient ID and intended procedure confirmed with present staff. Received instructions for my participation in the procedure from the performing physician.  

## 2015-05-23 ENCOUNTER — Telehealth: Payer: Self-pay | Admitting: *Deleted

## 2015-05-23 NOTE — Telephone Encounter (Signed)
Left message that we called for f/u 

## 2015-06-05 ENCOUNTER — Telehealth: Payer: Self-pay

## 2015-06-05 NOTE — Telephone Encounter (Signed)
Error

## 2015-06-10 ENCOUNTER — Encounter: Payer: Self-pay | Admitting: Internal Medicine

## 2015-06-10 NOTE — Progress Notes (Signed)
Quick Note:  Benign mucosa Consider repeat colon 2026 (age 69) ______

## 2015-06-13 ENCOUNTER — Other Ambulatory Visit: Payer: Self-pay | Admitting: Endocrinology

## 2015-06-16 ENCOUNTER — Ambulatory Visit: Payer: Commercial Managed Care - HMO | Admitting: Endocrinology

## 2015-06-16 LAB — FECAL OCCULT BLOOD, GUAIAC: FECAL OCCULT BLD: NEGATIVE

## 2015-06-20 ENCOUNTER — Ambulatory Visit (INDEPENDENT_AMBULATORY_CARE_PROVIDER_SITE_OTHER): Payer: Commercial Managed Care - HMO | Admitting: Endocrinology

## 2015-06-20 ENCOUNTER — Encounter: Payer: Self-pay | Admitting: Endocrinology

## 2015-06-20 VITALS — BP 136/80 | HR 106 | Temp 98.3°F | Ht 62.5 in | Wt 212.0 lb

## 2015-06-20 DIAGNOSIS — E118 Type 2 diabetes mellitus with unspecified complications: Secondary | ICD-10-CM

## 2015-06-20 DIAGNOSIS — E1165 Type 2 diabetes mellitus with hyperglycemia: Secondary | ICD-10-CM | POA: Diagnosis not present

## 2015-06-20 LAB — POCT GLYCOSYLATED HEMOGLOBIN (HGB A1C): Hemoglobin A1C: 7.8

## 2015-06-20 MED ORDER — INSULIN REGULAR HUMAN 100 UNIT/ML IJ SOLN: INTRAMUSCULAR | Status: DC

## 2015-06-20 NOTE — Patient Instructions (Addendum)
check your blood sugar 4 times a day--before the 3 meals, and at bedtime.  also check if you have symptoms of your blood sugar being too high or too low.  please keep a record of the readings and bring it to your next appointment here.  please call us sooner if you are having low blood sugar episodes.   Please come back for a follow-up appointment in 4 months.    Please stop taking the NPH, and: Increase the regular to 120 units with breakfast, 280 units with lunch, 350 units with evening meal.

## 2015-06-20 NOTE — Progress Notes (Signed)
Subjective:    Patient ID: Laurie [name], female    DOB: 1946-01-22, 69 y.o.   MRN: [id number]  HPI Pt returns for f/u of diabetes mellitus:   DM type: insulin-requiring type 2 Dx'ed: 1017 Complications: peripheral sensory neuropathy and CAD Therapy: insulin since 2011. GDM: 1985 DKA: never Severe hypoglycemia: never Pancreatitis: never Other: she has severe insulin resistance; she required very little insulin during a hospitalization in early 2014; she wants to delay weight-loss surgery for now. Interval history:  pt states she feels well in general.  she brings a record of her cbg's which i have reviewed today.  It varies from 45-300.  It is lowest at breakfast and lunch.    Past Medical History  Diagnosis Date  . Bell's palsy 01/05/2010  . Multinodular goiter   . Hepatitis   . Thyromegaly     mild  . 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 Hyperlipidemia     Intolerance to several statins   . Hypertensive heart disease without CHF   . HIstory of Bell's palsy     October 2012   . Obesity (BMI 30-39.9)   . Peripheral neuropathy ([place])   . Sleep apnea   . CAD (coronary artery disease)     Cath  07/21/1999  mild ostial, 80% stenosis proximal LAD, 90% stenosis proximal Diag 1, 95% stenosis proximal OM 1, 80% stenosis proximal RCA, 95% stenosis mid RCA    CABG 07/22/99  LIMA to LAD, SVG to dx, SVG to OM, SVG to PDA  Dr. Carmin Muskrat 2012 no ischemia EF 67%   . Seasonal allergies   . Cataracts, bilateral   . Hypertension   . Pacemaker     Past Surgical History  Procedure Laterality Date  . Coronary artery bypass graft  2000  . Tubal ligation    . Biopsy thyroid  05/2010    percutaneous  . Foot surgery    . Carpal tunnel release    . Dental surgery    . Temporary pacemaker insertion N/A 10/22/2012    Procedure: TEMPORARY PACEMAKER INSERTION;  Surgeon: Troy Sine, MD;  Location: [facility] CATH LAB;  Service:  Cardiovascular;  Laterality: N/A;  . Left heart catheterization with coronary angiogram N/A 10/23/2012    Procedure: LEFT HEART CATHETERIZATION WITH CORONARY ANGIOGRAM;  Surgeon: Jacolyn Reedy, MD;  Location: [facility] CATH LAB;  Service: Cardiovascular;  Laterality: N/A;  . Permanent pacemaker insertion N/A 10/23/2012    Procedure: PERMANENT PACEMAKER INSERTION;  Surgeon: Deboraha Sprang, MD;  Location: [facility] CATH LAB;  Service: Cardiovascular;  Laterality: N/A;  . Lead revision N/A 10/25/2012    Procedure: LEAD REVISION;  Surgeon: Evans Lance, MD;  Location: [facility] CATH LAB;  Service: Cardiovascular;  Laterality: N/A;  . Eye surgery    . Cataract extraction w/ intraocular lens implant Left 12/16/2014  . Cataract extraction w/ intraocular lens  implant, bilateral Right 01/06/2015  . Dg 3rd digit right hand      cystectomy    Social History   Social History  . Marital Status: Married    Spouse Name: N/A  . Number of Children: 2  . Years of Education: N/A   Occupational History  .     Social History Main Topics  . Smoking status: Never Smoker   . Smokeless tobacco: Never Used  . Alcohol Use: No  . Drug Use: No  . Sexual Activity: No  Other Topics Concern  . Not on file   Social History Narrative   Does not work outside the home    Current Outpatient Prescriptions on File Prior to Visit  Medication Sig Dispense Refill  . ACCU-CHEK FASTCLIX LANCETS MISC USE TO CHECK BLOOD SUGAR 4 TIMES A DAY    . ACCU-CHEK SMARTVIEW test strip USE  TO CHECK BLOOD SUGAR FOUR TIMES DAILY 400 each 1  . Alcohol Swabs (B-D SINGLE USE SWABS REGULAR) PADS Use 8 daily for testing blood sugar and insulin injections. 800 each 1  . AMBULATORY NON FORMULARY MEDICATION Diabetic Nutrition Pack with Cinannon, Chromium and Vitamin D3 1500 IU    . AMBULATORY NON FORMULARY MEDICATION Omega Q Plus - Take 1 capsule once daily    . aspirin EC 81 MG EC tablet Take 1 tablet (81 mg total) by mouth daily.    [name] b complex  vitamins tablet Take 1 tablet by mouth daily.    . B-D ULTRAFINE III SHORT PEN 31G X 8 MM MISC     . beta carotene w/minerals (OCUVITE) tablet Take 1 tablet by mouth daily.    . Blood Glucose Calibration (ACCU-CHEK SMARTVIEW CONTROL) LIQD Use to calibrate blood glucose meter. 3 each 1  . fexofenadine (ALLEGRA) 180 MG tablet Take 180 mg by mouth daily.    . furosemide (LASIX) 40 MG tablet TAKE 1 AND 1/2 TABLETS EVERY DAY AS NEEDED 135 tablet 2  . ketoconazole (NIZORAL) 2 % cream APPLY TOPICALLY AS NEEDED 15 g 2  . KLOR-CON M20 20 MEQ tablet TAKE ONE TABLET BY MOUTH TWO TIMES DAILY. 180 tablet 2  . lisinopril (PRINIVIL,ZESTRIL) 40 MG tablet TAKE ONE TABLET BY MOUTH ONCE DAILY 90 tablet 0  . Oxymetazoline HCl (AFRIN NODRIP ORIGINAL NA) Place into the nose.    . Probiotic Product (PROBIOTIC DAILY) CAPS Take 1 capsule by mouth daily.    [name] RELION INSULIN SYR 1CC/30G 30G X 5/16" 1 ML MISC     . sodium chloride (OCEAN) 0.65 % nasal spray Place 1 spray into the nose as needed for congestion.    . verapamil (VERELAN PM) 120 MG 24 hr capsule TAKE ONE TABLET BY MOUTH ONCE DAILY 30 capsule 10   No current facility-administered medications on file prior to visit.    Allergies  Allergen Reactions  . Statins     Intolerance to several  . Tricor [Fenofibrate]     Leg pain    Family History  Problem Relation Age of Onset  . Diabetes Father   . Heart disease Father   . Diabetes Other     Siblings and 2 children  . Heart disease Other     CAD  . Diabetes Mother   . Colon cancer Neg Hx   . Colon polyps Neg Hx     BP 136/80 mmHg  Pulse 106  Temp(Src) 98.3 F (36.8 C) (Oral)  Ht 5' 2.5" (1.588 m)  Wt 212 lb (96.163 kg)  BMI 38.13 kg/m2  SpO2 94%  Review of Systems She denies LOC    Objective:   Physical Exam VITAL SIGNS:  See vs page GENERAL: no distress Pulses: dorsalis pedis intact bilat.  Skin: no ulcer on the feet. feet are of normal color and temp. There is a healed vein  harvest scar at the right leg.  Feet: no deformity. trace bilat leg edema.  Neuro: sensation is intact to touch on the feet,but decreased from normal.    A1c=7.6%  Assessment & Plan:  DM: Based on the pattern of her cbg's, she needs some adjustment in her therapy  Patient is advised the following: Patient Instructions  check your blood sugar 4 times a day--before the 3 meals, and at bedtime.  also check if you have symptoms of your blood sugar being too high or too low.  please keep a record of the readings and bring it to your next appointment here.  please call us sooner if you are having low blood sugar episodes.   Please come back for a follow-up appointment in 4 months.    Please stop taking the NPH, and: Increase the regular to 120 units with breakfast, 280 units with lunch, 350 units with evening meal.

## 2015-07-07 ENCOUNTER — Ambulatory Visit: Payer: Commercial Managed Care - HMO | Admitting: *Deleted

## 2015-07-07 DIAGNOSIS — E785 Hyperlipidemia, unspecified: Secondary | ICD-10-CM | POA: Diagnosis not present

## 2015-07-07 DIAGNOSIS — E668 Other obesity: Secondary | ICD-10-CM | POA: Diagnosis not present

## 2015-07-07 DIAGNOSIS — I119 Hypertensive heart disease without heart failure: Secondary | ICD-10-CM | POA: Diagnosis not present

## 2015-07-07 DIAGNOSIS — E1121 Type 2 diabetes mellitus with diabetic nephropathy: Secondary | ICD-10-CM | POA: Diagnosis not present

## 2015-07-07 DIAGNOSIS — Z951 Presence of aortocoronary bypass graft: Secondary | ICD-10-CM | POA: Diagnosis not present

## 2015-07-07 DIAGNOSIS — G4733 Obstructive sleep apnea (adult) (pediatric): Secondary | ICD-10-CM | POA: Diagnosis not present

## 2015-07-07 DIAGNOSIS — Z950 Presence of cardiac pacemaker: Secondary | ICD-10-CM | POA: Diagnosis not present

## 2015-07-07 DIAGNOSIS — R000 Tachycardia, unspecified: Secondary | ICD-10-CM | POA: Diagnosis not present

## 2015-07-07 DIAGNOSIS — I2510 Atherosclerotic heart disease of native coronary artery without angina pectoris: Secondary | ICD-10-CM | POA: Diagnosis not present

## 2015-07-21 ENCOUNTER — Ambulatory Visit: Payer: Commercial Managed Care - HMO | Admitting: Internal Medicine

## 2015-07-24 ENCOUNTER — Ambulatory Visit: Payer: Commercial Managed Care - HMO | Admitting: Internal Medicine

## 2015-08-12 ENCOUNTER — Encounter: Payer: Self-pay | Admitting: Internal Medicine

## 2015-08-12 ENCOUNTER — Ambulatory Visit (INDEPENDENT_AMBULATORY_CARE_PROVIDER_SITE_OTHER): Payer: Commercial Managed Care - HMO | Admitting: Internal Medicine

## 2015-08-12 VITALS — BP 150/84 | HR 93 | Temp 98.3°F | Resp 20 | Ht 62.5 in | Wt 213.0 lb

## 2015-08-12 DIAGNOSIS — E785 Hyperlipidemia, unspecified: Secondary | ICD-10-CM

## 2015-08-12 DIAGNOSIS — I2510 Atherosclerotic heart disease of native coronary artery without angina pectoris: Secondary | ICD-10-CM | POA: Diagnosis not present

## 2015-08-12 DIAGNOSIS — Z23 Encounter for immunization: Secondary | ICD-10-CM

## 2015-08-12 DIAGNOSIS — E0840 Diabetes mellitus due to underlying condition with diabetic neuropathy, unspecified: Secondary | ICD-10-CM

## 2015-08-12 DIAGNOSIS — Z794 Long term (current) use of insulin: Secondary | ICD-10-CM

## 2015-08-12 MED ORDER — KETOCONAZOLE 2 % EX CREA: TOPICAL_CREAM | CUTANEOUS | Status: DC

## 2015-08-12 NOTE — Addendum Note (Signed)
Addended by: Marian Sorrow on: 08/12/2015 01:04 PM   Modules accepted: Orders

## 2015-08-12 NOTE — Progress Notes (Signed)
Pre visit review using our clinic review tool, if applicable. No additional management support is needed unless otherwise documented below in the visit note. 

## 2015-08-12 NOTE — Patient Instructions (Addendum)
Limit your sodium (Salt) intake   Please check your hemoglobin A1c every 3 months    It is important that you exercise regularly, at least 20 minutes 3 to 4 times per week.  If you develop chest pain or shortness of breath seek  medical attention.  You need to lose weight.  Consider a lower calorie diet and regular exercise.  Return in 6 months for follow-up  Please see your eye doctor yearly to check for diabetic eye damage Back Exercises If you have pain in your back, do these exercises 2-3 times each day or as told by your doctor. When the pain goes away, do the exercises once each day, but repeat the steps more times for each exercise (do more repetitions). If you do not have pain in your back, do these exercises once each day or as told by your doctor. EXERCISES Single Knee to Chest Do these steps 3-5 times in a row for each leg: 1. Lie on your back on a firm bed or the floor with your legs stretched out. 2. Bring one knee to your chest. 3. Hold your knee to your chest by grabbing your knee or thigh. 4. Pull on your knee until you feel a gentle stretch in your lower back. 5. Keep doing the stretch for 10-30 seconds. 6. Slowly let go of your leg and straighten it. Pelvic Tilt Do these steps 5-10 times in a row: 1. Lie on your back on a firm bed or the floor with your legs stretched out. 2. Bend your knees so they point up to the ceiling. Your feet should be flat on the floor. 3. Tighten your lower belly (abdomen) muscles to press your lower back against the floor. This will make your tailbone point up to the ceiling instead of pointing down to your feet or the floor. 4. Stay in this position for 5-10 seconds while you gently tighten your muscles and breathe evenly. Cat-Cow Do these steps until your lower back bends more easily: 1. Get on your hands and knees on a firm surface. Keep your hands under your shoulders, and keep your knees under your hips. You may put padding under your  knees. 2. Let your head hang down, and make your tailbone point down to the floor so your lower back is round like the back of a cat. 3. Stay in this position for 5 seconds. 4. Slowly lift your head and make your tailbone point up to the ceiling so your back hangs low (sags) like the back of a cow. 5. Stay in this position for 5 seconds. Press-Ups Do these steps 5-10 times in a row: 1. Lie on your belly (face-down) on the floor. 2. Place your hands near your head, about shoulder-width apart. 3. While you keep your back relaxed and keep your hips on the floor, slowly straighten your arms to raise the top half of your body and lift your shoulders. Do not use your back muscles. To make yourself more comfortable, you may change where you place your hands. 4. Stay in this position for 5 seconds. 5. Slowly return to lying flat on the floor. Bridges Do these steps 10 times in a row: 1. Lie on your back on a firm surface. 2. Bend your knees so they point up to the ceiling. Your feet should be flat on the floor. 3. Tighten your butt muscles and lift your butt off of the floor until your waist is almost as high as your knees.  If you do not feel the muscles working in your butt and the back of your thighs, slide your feet 1-2 inches farther away from your butt. 4. Stay in this position for 3-5 seconds. 5. Slowly lower your butt to the floor, and let your butt muscles relax. If this exercise is too easy, try doing it with your arms crossed over your chest. Belly Crunches Do these steps 5-10 times in a row: 1. Lie on your back on a firm bed or the floor with your legs stretched out. 2. Bend your knees so they point up to the ceiling. Your feet should be flat on the floor. 3. Cross your arms over your chest. 4. Tip your chin a little bit toward your chest but do not bend your neck. 5. Tighten your belly muscles and slowly raise your chest just enough to lift your shoulder blades a tiny bit off of the  floor. 6. Slowly lower your chest and your head to the floor. Back Lifts Do these steps 5-10 times in a row: 1. Lie on your belly (face-down) with your arms at your sides, and rest your forehead on the floor. 2. Tighten the muscles in your legs and your butt. 3. Slowly lift your chest off of the floor while you keep your hips on the floor. Keep the back of your head in line with the curve in your back. Look at the floor while you do this. 4. Stay in this position for 3-5 seconds. 5. Slowly lower your chest and your face to the floor. GET HELP IF:  Your back pain gets a lot worse when you do an exercise.  Your back pain does not lessen 2 hours after you exercise. If you have any of these problems, stop doing the exercises. Do not do them again unless your doctor says it is okay. GET HELP RIGHT AWAY IF:  You have sudden, very bad back pain. If this happens, stop doing the exercises. Do not do them again unless your doctor says it is okay.   This information is not intended to replace advice given to you by your health care provider. Make sure you discuss any questions you have with your health care provider.   Document Released: 10/02/2010 Document Revised: 05/21/2015 Document Reviewed: 10/24/2014 Elsevier Interactive Patient Education [employer].

## 2015-08-12 NOTE — Progress Notes (Signed)
Subjective:    Patient ID: Laurie [name], female    DOB: 09-16-1945, 69 y.o.   MRN: [id number]  HPI  Lab Results  Component Value Date   HGBA1C 7.8 06/20/2015    BP Readings from Last 3 Encounters:  08/12/15 150/84  06/20/15 136/80  05/22/15 117/55    Wt Readings from Last 3 Encounters:  08/12/15 213 lb (96.616 kg)  06/20/15 212 lb (96.163 kg)  05/22/15 212 lb (96.77 kg)   69 year old patient who is seen today for her biannual follow-up.  She has a history of coronary artery disease and is followed by cardiology.  She is status post CABG as well as pacemaker insertion.  She has insulin-requiring diabetes with complications of neuropathy and vascular disease.  She is followed by endocrinology.  In general doing quite well.  Denies any cardiopulmonary complaints. She did have cataract extraction surgery in March of this year as well as a follow-up colonoscopy  Past Medical History  Diagnosis Date  . Bell's palsy 01/05/2010  . Multinodular goiter   . Hepatitis   . Thyromegaly     mild  . 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 Hyperlipidemia     Intolerance to several statins   . Hypertensive heart disease without CHF   . HIstory of Bell's palsy     October 2012   . Obesity (BMI 30-39.9)   . Peripheral neuropathy ([place])   . Sleep apnea   . CAD (coronary artery disease)     Cath  07/21/1999  mild ostial, 80% stenosis proximal LAD, 90% stenosis proximal Diag 1, 95% stenosis proximal OM 1, 80% stenosis proximal RCA, 95% stenosis mid RCA    CABG 07/22/99  LIMA to LAD, SVG to dx, SVG to OM, SVG to PDA  Dr. Carmin Muskrat 2012 no ischemia EF 67%   . Seasonal allergies   . Cataracts, bilateral   . Hypertension   . Pacemaker     Social History   Social History  . Marital Status: Married    Spouse Name: N/A  . Number of Children: 2  . Years of Education: N/A   Occupational History  .     Social History Main  Topics  . Smoking status: Never Smoker   . Smokeless tobacco: Never Used  . Alcohol Use: No  . Drug Use: No  . Sexual Activity: No   Other Topics Concern  . Not on file   Social History Narrative   Does not work outside the home    Past Surgical History  Procedure Laterality Date  . Coronary artery bypass graft  2000  . Tubal ligation    . Biopsy thyroid  05/2010    percutaneous  . Foot surgery    . Carpal tunnel release    . Dental surgery    . Temporary pacemaker insertion N/A 10/22/2012    Procedure: TEMPORARY PACEMAKER INSERTION;  Surgeon: Troy Sine, MD;  Location: [facility] CATH LAB;  Service: Cardiovascular;  Laterality: N/A;  . Left heart catheterization with coronary angiogram N/A 10/23/2012    Procedure: LEFT HEART CATHETERIZATION WITH CORONARY ANGIOGRAM;  Surgeon: Jacolyn Reedy, MD;  Location: [facility] CATH LAB;  Service: Cardiovascular;  Laterality: N/A;  . Permanent pacemaker insertion N/A 10/23/2012    Procedure: PERMANENT PACEMAKER INSERTION;  Surgeon: Deboraha Sprang, MD;  Location: [facility] CATH LAB;  Service: Cardiovascular;  Laterality: N/A;  . Lead revision N/A 10/25/2012  Procedure: LEAD REVISION;  Surgeon: Evans Lance, MD;  Location[facility] CATH LAB;  Service: Cardiovascular;  Laterality: N/A;  . Eye surgery    . Cataract extraction w/ intraocular lens implant Left 12/16/2014  . Cataract extraction w/ intraocular lens  implant, bilateral Right 01/06/2015  . Dg 3rd digit right hand      cystectomy    Family History  Problem Relation Age of Onset  . Diabetes Father   . Heart disease Father   . Diabetes Other     Siblings and 2 children  . Heart disease Other     CAD  . Diabetes Mother   . Colon cancer Neg Hx   . Colon polyps Neg Hx     Allergies  Allergen Reactions  . Statins     Intolerance to several  . Tricor [Fenofibrate]     Leg pain    Current Outpatient Prescriptions on File Prior to Visit  Medication Sig Dispense Refill  . ACCU-CHEK FASTCLIX  LANCETS MISC USE TO CHECK BLOOD SUGAR 4 TIMES A DAY    . ACCU-CHEK SMARTVIEW test strip USE  TO CHECK BLOOD SUGAR FOUR TIMES DAILY 400 each 1  . Alcohol Swabs (B-D SINGLE USE SWABS REGULAR) PADS Use 8 daily for testing blood sugar and insulin injections. 800 each 1  . AMBULATORY NON FORMULARY MEDICATION Diabetic Nutrition Pack with [place], Chromium and Vitamin D3 1500 IU    . AMBULATORY NON FORMULARY MEDICATION Omega Q Plus - Take 1 capsule once daily    . aspirin EC 81 MG EC tablet Take 1 tablet (81 mg total) by mouth daily.    [name] b complex vitamins tablet Take 1 tablet by mouth daily.    . beta carotene w/minerals (OCUVITE) tablet Take 1 tablet by mouth daily.    . Blood Glucose Calibration (ACCU-CHEK SMARTVIEW CONTROL) LIQD Use to calibrate blood glucose meter. 3 each 1  . cholecalciferol (VITAMIN D) 1000 UNITS tablet Take 2,000 Units by mouth daily.    . fexofenadine (ALLEGRA) 180 MG tablet Take 180 mg by mouth daily.    . furosemide (LASIX) 40 MG tablet TAKE 1 AND 1/2 TABLETS EVERY DAY AS NEEDED 135 tablet 2  . insulin regular (NOVOLIN R,HUMULIN R) 100 units/mL injection 120 units with breakfast, 280 units with lunch, 350 units with evening meal,  also 100-unit syringes 10/day, 260 mL 11  . KLOR-CON M20 20 MEQ tablet TAKE ONE TABLET BY MOUTH TWO TIMES DAILY. 180 tablet 2  . lisinopril (PRINIVIL,ZESTRIL) 40 MG tablet TAKE ONE TABLET BY MOUTH ONCE DAILY 90 tablet 0  . Oxymetazoline HCl (AFRIN NODRIP ORIGINAL NA) Place into the nose.    . Probiotic Product (PROBIOTIC DAILY) CAPS Take 1 capsule by mouth daily.    [name] RELION INSULIN SYR 1CC/30G 30G X 5/16" 1 ML MISC     . sodium chloride (OCEAN) 0.65 % nasal spray Place 1 spray into the nose as needed for congestion.    . verapamil (VERELAN PM) 120 MG 24 hr capsule TAKE ONE TABLET BY MOUTH ONCE DAILY 30 capsule 10   No current facility-administered medications on file prior to visit.    BP 150/84 mmHg  Pulse 93  Temp(Src) 98.3 F (36.8 C)  (Oral)  Resp 20  Ht 5' 2.5" (1.588 m)  Wt 213 lb (96.616 kg)  BMI 38.31 kg/m2  SpO2 97%      Review of Systems  Constitutional: Negative.   HENT: Negative for congestion, dental problem, hearing loss,  rhinorrhea, sinus pressure, sore throat and tinnitus.   Eyes: Negative for pain, discharge and visual disturbance.  Respiratory: Negative for cough and shortness of breath.   Cardiovascular: Negative for chest pain, palpitations and leg swelling.  Gastrointestinal: Negative for nausea, vomiting, abdominal pain, diarrhea, constipation, blood in stool and abdominal distention.  Genitourinary: Negative for dysuria, urgency, frequency, hematuria, flank pain, vaginal bleeding, vaginal discharge, difficulty urinating, vaginal pain and pelvic pain.  Musculoskeletal: Negative for joint swelling, arthralgias and gait problem.  Skin: Negative for rash.  Neurological: Negative for dizziness, syncope, speech difficulty, weakness, numbness and headaches.  Hematological: Negative for adenopathy.  Psychiatric/Behavioral: Negative for behavioral problems, dysphoric mood and agitation. The patient is not nervous/anxious.        Objective:   Physical Exam  Constitutional: She is oriented to person, place, and time. She appears well-developed and well-nourished.  HENT:  Head: Normocephalic.  Right Ear: External ear normal.  Left Ear: External ear normal.  Mouth/Throat: Oropharynx is clear and moist.  Eyes: Conjunctivae and EOM are normal. Pupils are equal, round, and reactive to light.  Neck: Normal range of motion. Neck supple. No thyromegaly present.  Cardiovascular: Normal rate, regular rhythm and normal heart sounds.   Posterior tibial pulses diminished  Pulmonary/Chest: Effort normal and breath sounds normal.  Abdominal: Soft. Bowel sounds are normal. She exhibits no mass. There is no tenderness.  Musculoskeletal: Normal range of motion.  Lymphadenopathy:    She has no cervical adenopathy.    Neurological: She is alert and oriented to person, place, and time.  Skin: Skin is warm and dry. No rash noted.  Psychiatric: She has a normal mood and affect. Her behavior is normal.          Assessment & Plan:   Diabetes mellitus.  Follow-up endocrinology Hypertension, stable Coronary artery disease, stable Preventive health.  Flu vaccine and Pneumovax administered  CPX 6 months

## 2015-08-22 ENCOUNTER — Other Ambulatory Visit: Payer: Self-pay | Admitting: *Deleted

## 2015-08-22 MED ORDER — LISINOPRIL 40 MG PO TABS: 40.0000 mg | ORAL_TABLET | Freq: Every day | ORAL | Status: DC

## 2015-08-22 NOTE — Telephone Encounter (Signed)
Rx done. 

## 2015-09-26 ENCOUNTER — Other Ambulatory Visit: Payer: Self-pay | Admitting: Endocrinology

## 2015-10-03 ENCOUNTER — Telehealth: Payer: Self-pay | Admitting: Internal Medicine

## 2015-10-03 ENCOUNTER — Encounter: Payer: Self-pay | Admitting: Family Medicine

## 2015-10-03 ENCOUNTER — Ambulatory Visit (INDEPENDENT_AMBULATORY_CARE_PROVIDER_SITE_OTHER): Payer: Commercial Managed Care - HMO | Admitting: Family Medicine

## 2015-10-03 VITALS — BP 131/65 | HR 101 | Temp 99.0°F | Ht 62.5 in | Wt 212.0 lb

## 2015-10-03 DIAGNOSIS — J019 Acute sinusitis, unspecified: Secondary | ICD-10-CM

## 2015-10-03 MED ORDER — AMOXICILLIN 875 MG PO TABS: 875.0000 mg | ORAL_TABLET | Freq: Two times a day (BID) | ORAL | Status: DC

## 2015-10-03 NOTE — Telephone Encounter (Signed)
Pt scheduled to see Dr. Sarajane Jews at 2:30 today.

## 2015-10-03 NOTE — Telephone Encounter (Signed)
[facility] - Client [place]  Patient Name: Laurie [name]  DOB: Aug 24, 1946    Initial Comment Caller states, has a red throat, tonsils hurt, sometimes she feels hoarse, she has an odd feeling in her chest when she takes a deep breath    Nurse Assessment  Nurse: Wayne Sever, RN, Tillie Rung Date/Time ([place] Time): 10/03/2015 12:27:56 PM  Confirm and document reason for call. If symptomatic, describe symptoms. You must click the next button to save text entered. ---Caller states she has sore tonsil. She had some green discharge from the sinuses. She states she has been washing them out. She states when she takes a deep breath her chest is achey. She is not coughing at all per caller.  Has the patient traveled out of the country within the last 30 days? ---Not Applicable  Does the patient have any new or worsening symptoms? ---Yes  Will a triage be completed? ---Yes  Related visit to physician within the last 2 weeks? ---No  Does the PT have any chronic conditions? (i.e. diabetes, asthma, etc.) ---Yes  List chronic conditions. ---Pacemaker, Diabetes  Is this a behavioral health or substance abuse call? ---No     Guidelines    Guideline Title Affirmed Question Affirmed Notes  Sore Throat Diabetes mellitus or weak immune system (e.g., HIV positive, cancer chemo, splenectomy, organ transplant)    Final Disposition User   See Physician within 24 Hours [place], RN, [employer]    Referrals  REFERRED TO PCP OFFICE   Disagree/Comply: Leta Baptist

## 2015-10-03 NOTE — Progress Notes (Signed)
Pre visit review using our clinic review tool, if applicable. No additional management support is needed unless otherwise documented below in the visit note. 

## 2015-10-06 ENCOUNTER — Encounter: Payer: Self-pay | Admitting: Family Medicine

## 2015-10-06 NOTE — Progress Notes (Signed)
   Subjective:    Patient ID: Laurie [name], female    DOB: 1946/03/17, 70 y.o.   MRN: [id number]  HPI herefor one week of sinus congestion, PND, ST, and a dry cough. No fever. On Mucinex.    Review of Systems  Constitutional: Negative.   HENT: Positive for congestion, postnasal drip, sinus pressure and sore throat. Negative for ear pain.   Eyes: Negative.   Respiratory: Positive for cough.        Objective:   Physical Exam  Constitutional: She appears well-developed and well-nourished.  HENT:  Right Ear: External ear normal.  Left Ear: External ear normal.  Nose: Nose normal.  Mouth/Throat: Oropharynx is clear and moist. No oropharyngeal exudate.  Eyes: Conjunctivae are normal.  Neck: No thyromegaly present.  Pulmonary/Chest: Effort normal. No respiratory distress. She has no wheezes. She has no rales.  Lymphadenopathy:    She has no cervical adenopathy.          Assessment & Plan:  Sinusitis, treat with Amoxicillin

## 2015-10-15 ENCOUNTER — Telehealth: Payer: Self-pay | Admitting: Internal Medicine

## 2015-10-15 NOTE — Telephone Encounter (Signed)
FYI - Patient has appt tomorrow with Dr. Raliegh Ip. Thanks!

## 2015-10-15 NOTE — Telephone Encounter (Signed)
[facility] - Client [place] [facility]  Patient Name: Laurie [name]  DOB: 09/19/1945    Initial Comment Caller states she was seen on the 20th. Sinus infection, or virus. She was on antibiotics for 10 days. She's having trouble taking a deep breath.   Nurse Assessment  Nurse: Wayne Sever, RN, Tillie Rung Date/Time ([place] Time): 10/15/2015 12:29:54 PM  Confirm and document reason for call. If symptomatic, describe symptoms. You must click the next button to save text entered. ---Caller states she was treated with antibiotics for a sinus infection. She states she finished on the 30th. She states her wind pipe feels sore. She denies any chest pain. She states her esophagus just feels raw. She also denies any sore throat. She states her sinuses feel like they are still draining.  Has the patient traveled out of the country within the last 30 days? ---Not Applicable  Does the patient have any new or worsening symptoms? ---Yes  Will a triage be completed? ---Yes  Related visit to physician within the last 2 weeks? ---Yes  Does the PT have any chronic conditions? (i.e. diabetes, asthma, etc.) ---Yes  List chronic conditions. ---Type 2 Diabetes, Pacemaker, Prior Open Heart Surgery Bypass  Is this a behavioral health or substance abuse call? ---No     Guidelines    Guideline Title Affirmed Question Affirmed Notes  Sinus Pain or Congestion Fever present > 3 days (72 hours)    Final Disposition User   See Physician within 24 Hours [place], RN, [employer]    Referrals  REFERRED TO PCP OFFICE   Disagree/Comply: Leta Baptist

## 2015-10-16 ENCOUNTER — Ambulatory Visit (INDEPENDENT_AMBULATORY_CARE_PROVIDER_SITE_OTHER): Payer: Commercial Managed Care - HMO | Admitting: Internal Medicine

## 2015-10-16 ENCOUNTER — Ambulatory Visit
Admission: RE | Admit: 2015-10-16 | Discharge: 2015-10-16 | Disposition: A | Discharge status: Home / Self Care | Payer: Commercial Managed Care - HMO | Source: Ambulatory Visit | Attending: Internal Medicine | Admitting: Internal Medicine

## 2015-10-16 ENCOUNTER — Encounter: Payer: Self-pay | Admitting: Internal Medicine

## 2015-10-16 VITALS — BP 140/84 | HR 112 | Temp 98.7°F | Resp 20 | Ht 62.5 in | Wt 214.0 lb

## 2015-10-16 DIAGNOSIS — Z951 Presence of aortocoronary bypass graft: Secondary | ICD-10-CM | POA: Diagnosis not present

## 2015-10-16 DIAGNOSIS — R05 Cough: Secondary | ICD-10-CM | POA: Diagnosis not present

## 2015-10-16 DIAGNOSIS — E1140 Type 2 diabetes mellitus with diabetic neuropathy, unspecified: Secondary | ICD-10-CM | POA: Diagnosis not present

## 2015-10-16 DIAGNOSIS — I119 Hypertensive heart disease without heart failure: Secondary | ICD-10-CM

## 2015-10-16 DIAGNOSIS — E0865 Diabetes mellitus due to underlying condition with hyperglycemia: Secondary | ICD-10-CM | POA: Diagnosis not present

## 2015-10-16 DIAGNOSIS — I2510 Atherosclerotic heart disease of native coronary artery without angina pectoris: Secondary | ICD-10-CM | POA: Diagnosis not present

## 2015-10-16 DIAGNOSIS — R0600 Dyspnea, unspecified: Secondary | ICD-10-CM

## 2015-10-16 DIAGNOSIS — E088 Diabetes mellitus due to underlying condition with unspecified complications: Secondary | ICD-10-CM | POA: Diagnosis not present

## 2015-10-16 DIAGNOSIS — E1165 Type 2 diabetes mellitus with hyperglycemia: Secondary | ICD-10-CM

## 2015-10-16 LAB — CBC WITH DIFFERENTIAL/PLATELET
BASOS ABS: 0.1 10*3/uL (ref 0.0–0.1)
BASOS PCT: 0.7 % (ref 0.0–3.0)
EOS ABS: 0.2 10*3/uL (ref 0.0–0.7)
Eosinophils Relative: 2.8 % (ref 0.0–5.0)
HEMATOCRIT: 39.4 % (ref 36.0–46.0)
HEMOGLOBIN: 12.7 g/dL (ref 12.0–15.0)
LYMPHS PCT: 22.5 % (ref 12.0–46.0)
Lymphs Abs: 1.8 10*3/uL (ref 0.7–4.0)
MCHC: 32.2 g/dL (ref 30.0–36.0)
MCV: 91.5 fl (ref 78.0–100.0)
MONOS PCT: 6.7 % (ref 3.0–12.0)
Monocytes Absolute: 0.5 10*3/uL (ref 0.1–1.0)
NEUTROS ABS: 5.3 10*3/uL (ref 1.4–7.7)
Neutrophils Relative %: 67.3 % (ref 43.0–77.0)
PLATELETS: 252 10*3/uL (ref 150.0–400.0)
RBC: 4.3 Mil/uL (ref 3.87–5.11)
RDW: 13.9 % (ref 11.5–15.5)
WBC: 7.9 10*3/uL (ref 4.0–10.5)

## 2015-10-16 LAB — COMPREHENSIVE METABOLIC PANEL
ALK PHOS: 84 U/L (ref 39–117)
ALT: 18 U/L (ref 0–35)
AST: 15 U/L (ref 0–37)
Albumin: 4 g/dL (ref 3.5–5.2)
BILIRUBIN TOTAL: 0.4 mg/dL (ref 0.2–1.2)
BUN: 14 mg/dL (ref 6–23)
CALCIUM: 10.1 mg/dL (ref 8.4–10.5)
CO2: 26 meq/L (ref 19–32)
Chloride: 107 mEq/L (ref 96–112)
Creatinine, Ser: 0.88 mg/dL (ref 0.40–1.20)
GFR: 67.68 mL/min (ref >60.00)
GLUCOSE: 163 mg/dL — AB (ref 70–99)
POTASSIUM: 4.2 meq/L (ref 3.5–5.1)
Sodium: 143 mEq/L (ref 135–145)
Total Protein: 7.1 g/dL (ref 6.0–8.3)

## 2015-10-16 LAB — BRAIN NATRIURETIC PEPTIDE: PRO B NATRI PEPTIDE: 80 pg/mL (ref 0.0–100.0)

## 2015-10-16 LAB — TSH: TSH: 1.59 u[IU]/mL (ref 0.35–4.50)

## 2015-10-16 LAB — HEMOGLOBIN A1C: Hgb A1c MFr Bld: 7.3 % — ABNORMAL HIGH (ref 4.6–6.5)

## 2015-10-16 MED ORDER — METOPROLOL TARTRATE 25 MG PO TABS
25.0000 mg | ORAL_TABLET | Freq: Two times a day (BID) | ORAL | Status: DC
Start: 2015-10-16 — End: 2015-10-22

## 2015-10-16 NOTE — Progress Notes (Signed)
Subjective:    Patient ID: Laurie [name], female    DOB: Oct 29, 1945, 70 y.o.   MRN: [id number]  HPI  70 year old patient who presents with a two-month history of vague chest congestion.  She was seen on January 20 and treated for an acute sinusitis.  She describes some vague discomfort in the shoulder and anterior chest area.  Initially on the right, but also involving the left.  She describes some vague neck discomfort and "swollen tonsils".  At times she has had some dyspnea on exertion and other times she states that she has had a difficult time laying flat.  Denies any exertional chest pain She does have a history of coronary artery disease status post CABG.  Also history complete heart block status post pacemaker She has had occasional low-grade fever as high as 100 point 6  Wt Readings from Last 3 Encounters:  10/16/15 214 lb (97.07 kg)  10/03/15 212 lb (96.163 kg)  08/12/15 213 lb (96.616 kg)    Past Medical History  Diagnosis Date  . Bell's palsy 01/05/2010  . Multinodular goiter   . Hepatitis   . Thyromegaly     mild  . 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 Hyperlipidemia     Intolerance to several statins   . Hypertensive heart disease without CHF   . HIstory of Bell's palsy     October 2012   . Obesity (BMI 30-39.9)   . Peripheral neuropathy ([place])   . Sleep apnea   . CAD (coronary artery disease)     Cath  07/21/1999  mild ostial, 80% stenosis proximal LAD, 90% stenosis proximal Diag 1, 95% stenosis proximal OM 1, 80% stenosis proximal RCA, 95% stenosis mid RCA    CABG 07/22/99  LIMA to LAD, SVG to dx, SVG to OM, SVG to PDA  Dr. Carmin Muskrat 2012 no ischemia EF 67%   . Seasonal allergies   . Cataracts, bilateral   . Hypertension   . Pacemaker     Social History   Social History  . Marital Status: Married    Spouse Name: N/A  . Number of Children: 2  . Years of Education: N/A   Occupational  History  .     Social History Main Topics  . Smoking status: Never Smoker   . Smokeless tobacco: Never Used  . Alcohol Use: No  . Drug Use: No  . Sexual Activity: No   Other Topics Concern  . Not on file   Social History Narrative   Does not work outside the home    Past Surgical History  Procedure Laterality Date  . Coronary artery bypass graft  2000  . Tubal ligation    . Biopsy thyroid  05/2010    percutaneous  . Foot surgery    . Carpal tunnel release    . Dental surgery    . Temporary pacemaker insertion N/A 10/22/2012    Procedure: TEMPORARY PACEMAKER INSERTION;  Surgeon: Troy Sine, MD;  Location: [facility] CATH LAB;  Service: Cardiovascular;  Laterality: N/A;  . Left heart catheterization with coronary angiogram N/A 10/23/2012    Procedure: LEFT HEART CATHETERIZATION WITH CORONARY ANGIOGRAM;  Surgeon: Jacolyn Reedy, MD;  Location: [facility] CATH LAB;  Service: Cardiovascular;  Laterality: N/A;  . Permanent pacemaker insertion N/A 10/23/2012    Procedure: PERMANENT PACEMAKER INSERTION;  Surgeon: Deboraha Sprang, MD;  Location: [facility] CATH LAB;  Service: Cardiovascular;  Laterality: N/A;  . Lead revision N/A 10/25/2012    Procedure: LEAD REVISION;  Surgeon: Evans Lance, MD;  Location: [facility] CATH LAB;  Service: Cardiovascular;  Laterality: N/A;  . Eye surgery    . Cataract extraction w/ intraocular lens implant Left 12/16/2014  . Cataract extraction w/ intraocular lens  implant, bilateral Right 01/06/2015  . Dg 3rd digit right hand      cystectomy    Family History  Problem Relation Age of Onset  . Diabetes Father   . Heart disease Father   . Diabetes Other     Siblings and 2 children  . Heart disease Other     CAD  . Diabetes Mother   . Colon cancer Neg Hx   . Colon polyps Neg Hx     Allergies  Allergen Reactions  . Statins     Intolerance to several  . Tricor [Fenofibrate]     Leg pain    Current Outpatient Prescriptions on File Prior to Visit  Medication Sig  Dispense Refill  . ACCU-CHEK FASTCLIX LANCETS MISC USE TO CHECK BLOOD SUGAR 4 TIMES A DAY    . ACCU-CHEK SMARTVIEW test strip USE  TO CHECK BLOOD SUGAR FOUR TIMES DAILY 400 each 1  . Alcohol Swabs (B-D SINGLE USE SWABS REGULAR) PADS Use 8 daily for testing blood sugar and insulin injections. 800 each 1  . AMBULATORY NON FORMULARY MEDICATION Reported on 10/03/2015    . AMBULATORY NON FORMULARY MEDICATION Omega Q Plus - Take 1 capsule once daily    . aspirin EC 81 MG EC tablet Take 1 tablet (81 mg total) by mouth daily.    [name] b complex vitamins tablet Take 1 tablet by mouth daily.    . BD INSULIN SYRINGE ULTRAFINE 31G X 15/64" 1 ML MISC USE SYRINGES 10 TIMES A DAY WITH INSULIN INJECTIONS AS DIRECTED. 300 each 0  . beta carotene w/minerals (OCUVITE) tablet Take 1 tablet by mouth daily.    . Blood Glucose Calibration (ACCU-CHEK SMARTVIEW CONTROL) LIQD Use to calibrate blood glucose meter. 3 each 1  . cholecalciferol (VITAMIN D) 1000 UNITS tablet Take 2,000 Units by mouth daily.    . fexofenadine (ALLEGRA) 180 MG tablet Take 180 mg by mouth daily.    . furosemide (LASIX) 40 MG tablet TAKE 1 AND 1/2 TABLETS EVERY DAY AS NEEDED 135 tablet 2  . ketoconazole (NIZORAL) 2 % cream APPLY TOPICALLY AS NEEDED 60 g 3  . KLOR-CON M20 20 MEQ tablet TAKE ONE TABLET BY MOUTH TWO TIMES DAILY. 180 tablet 2  . lisinopril (PRINIVIL,ZESTRIL) 40 MG tablet Take 1 tablet (40 mg total) by mouth daily. 90 tablet 1  . NOVOLIN R RELION 100 UNIT/ML injection INJECT 150 UNITS WITH BREAKFAST,270 UNITS WITH LUNCH,340 UNITS WITH THE EVENING MEAL (Patient taking differently: INJECT 120 UNITS WITH BREAKFAST,170 UNITS WITH LUNCH,150 UNITS WITH THE EVENING MEAL) 28 vial 5  . Oxymetazoline HCl (AFRIN NODRIP ORIGINAL NA) Place into the nose. Reported on 10/03/2015    . Probiotic Product (PROBIOTIC DAILY) CAPS Take 1 capsule by mouth daily.    [name] RELION INSULIN SYR 1CC/30G 30G X 5/16" 1 ML MISC     . sodium chloride (OCEAN) 0.65 % nasal spray  Place 1 spray into the nose as needed for congestion.    . verapamil (VERELAN PM) 120 MG 24 hr capsule TAKE ONE TABLET BY MOUTH ONCE DAILY 30 capsule 10   No current facility-administered medications on file prior to visit.  BP 140/84 mmHg  Pulse 112  Temp(Src) 98.7 F (37.1 C) (Oral)  Resp 20  Ht 5' 2.5" (1.588 m)  Wt 214 lb (97.07 kg)  BMI 38.49 kg/m2  SpO2 97%     Review of Systems  Constitutional: Positive for fever, activity change and fatigue.  HENT: Negative for congestion, dental problem, hearing loss, rhinorrhea, sinus pressure, sore throat and tinnitus.   Eyes: Negative for pain, discharge and visual disturbance.  Respiratory: Positive for chest tightness and shortness of breath. Negative for cough.   Cardiovascular: Negative for chest pain, palpitations and leg swelling.  Gastrointestinal: Negative for nausea, vomiting, abdominal pain, diarrhea, constipation, blood in stool and abdominal distention.  Genitourinary: Negative for dysuria, urgency, frequency, hematuria, flank pain, vaginal bleeding, vaginal discharge, difficulty urinating, vaginal pain and pelvic pain.  Musculoskeletal: Negative for joint swelling, arthralgias and gait problem.  Skin: Negative for rash.  Neurological: Negative for dizziness, syncope, speech difficulty, weakness, numbness and headaches.  Hematological: Negative for adenopathy.  Psychiatric/Behavioral: Negative for behavioral problems, dysphoric mood and agitation. The patient is not nervous/anxious.        Objective:   Physical Exam  Constitutional: She is oriented to person, place, and time. She appears well-developed and well-nourished. No distress.  Pulse rate 112 O2 saturation 97  HENT:  Head: Normocephalic.  Right Ear: External ear normal.  Left Ear: External ear normal.  Mouth/Throat: Oropharynx is clear and moist.  Eyes: Conjunctivae and EOM are normal. Pupils are equal, round, and reactive to light.  Neck: Normal range  of motion. Neck supple. No thyromegaly present.  Cardiovascular: Regular rhythm, normal heart sounds and intact distal pulses.   Resting tachycardia with gallop rhythm  Pulmonary/Chest: Effort normal and breath sounds normal.  Abdominal: Soft. Bowel sounds are normal. She exhibits no mass. There is no tenderness.  Musculoskeletal: Normal range of motion. She exhibits no edema.  Lymphadenopathy:    She has no cervical adenopathy.  Neurological: She is alert and oriented to person, place, and time.  Skin: Skin is warm and dry. No rash noted.  Psychiatric: She has a normal mood and affect. Her behavior is normal.          Assessment & Plan:   Vague chest discomfort Resting tachycardia Periodic DOE Diabetes.  Will check a hemoglobin A1c  Check chest x-ray, d-dimer, BNP Metoprolol 25 mg twice a day  Recheck next week  Chest CTA.  If d-dimer positive

## 2015-10-16 NOTE — Patient Instructions (Signed)
Limit your sodium (Salt) intake  Chest x-ray as discussed  Return in one week for follow-up

## 2015-10-16 NOTE — Progress Notes (Signed)
Pre visit review using our clinic review tool, if applicable. No additional management support is needed unless otherwise documented below in the visit note. 

## 2015-10-17 ENCOUNTER — Inpatient Hospital Stay (HOSPITAL_COMMUNITY)
Admission: EM | Admit: 2015-10-17 | Discharge: 2015-10-22 | DRG: 287 | Disposition: A | Discharge status: Home / Self Care | Payer: Commercial Managed Care - HMO | Attending: Cardiology | Admitting: Cardiology

## 2015-10-17 ENCOUNTER — Encounter (HOSPITAL_COMMUNITY): Payer: Self-pay | Admitting: Emergency Medicine

## 2015-10-17 ENCOUNTER — Emergency Department (HOSPITAL_COMMUNITY): Payer: Commercial Managed Care - HMO

## 2015-10-17 ENCOUNTER — Inpatient Hospital Stay (HOSPITAL_COMMUNITY): Payer: Commercial Managed Care - HMO

## 2015-10-17 DIAGNOSIS — E785 Hyperlipidemia, unspecified: Secondary | ICD-10-CM | POA: Diagnosis present

## 2015-10-17 DIAGNOSIS — E669 Obesity, unspecified: Secondary | ICD-10-CM | POA: Diagnosis present

## 2015-10-17 DIAGNOSIS — Z8249 Family history of ischemic heart disease and other diseases of the circulatory system: Secondary | ICD-10-CM | POA: Diagnosis not present

## 2015-10-17 DIAGNOSIS — I25810 Atherosclerosis of coronary artery bypass graft(s) without angina pectoris: Secondary | ICD-10-CM | POA: Diagnosis present

## 2015-10-17 DIAGNOSIS — N3091 Cystitis, unspecified with hematuria: Secondary | ICD-10-CM | POA: Diagnosis not present

## 2015-10-17 DIAGNOSIS — Z6836 Body mass index (BMI) 36.0-36.9, adult: Secondary | ICD-10-CM

## 2015-10-17 DIAGNOSIS — R000 Tachycardia, unspecified: Secondary | ICD-10-CM | POA: Diagnosis present

## 2015-10-17 DIAGNOSIS — G4730 Sleep apnea, unspecified: Secondary | ICD-10-CM | POA: Diagnosis present

## 2015-10-17 DIAGNOSIS — Z794 Long term (current) use of insulin: Secondary | ICD-10-CM

## 2015-10-17 DIAGNOSIS — Z7982 Long term (current) use of aspirin: Secondary | ICD-10-CM

## 2015-10-17 DIAGNOSIS — N390 Urinary tract infection, site not specified: Secondary | ICD-10-CM | POA: Diagnosis present

## 2015-10-17 DIAGNOSIS — Z950 Presence of cardiac pacemaker: Secondary | ICD-10-CM

## 2015-10-17 DIAGNOSIS — I420 Dilated cardiomyopathy: Secondary | ICD-10-CM | POA: Diagnosis not present

## 2015-10-17 DIAGNOSIS — I429 Cardiomyopathy, unspecified: Secondary | ICD-10-CM | POA: Diagnosis not present

## 2015-10-17 DIAGNOSIS — I10 Essential (primary) hypertension: Secondary | ICD-10-CM | POA: Diagnosis not present

## 2015-10-17 DIAGNOSIS — I110 Hypertensive heart disease with heart failure: Principal | ICD-10-CM | POA: Diagnosis present

## 2015-10-17 DIAGNOSIS — I5023 Acute on chronic systolic (congestive) heart failure: Secondary | ICD-10-CM | POA: Diagnosis not present

## 2015-10-17 DIAGNOSIS — I2510 Atherosclerotic heart disease of native coronary artery without angina pectoris: Secondary | ICD-10-CM | POA: Diagnosis present

## 2015-10-17 DIAGNOSIS — R0902 Hypoxemia: Secondary | ICD-10-CM | POA: Diagnosis present

## 2015-10-17 DIAGNOSIS — I5043 Acute on chronic combined systolic (congestive) and diastolic (congestive) heart failure: Secondary | ICD-10-CM | POA: Diagnosis present

## 2015-10-17 DIAGNOSIS — Z95810 Presence of automatic (implantable) cardiac defibrillator: Secondary | ICD-10-CM | POA: Diagnosis present

## 2015-10-17 DIAGNOSIS — B9620 Unspecified Escherichia coli [E. coli] as the cause of diseases classified elsewhere: Secondary | ICD-10-CM | POA: Diagnosis present

## 2015-10-17 DIAGNOSIS — I480 Paroxysmal atrial fibrillation: Secondary | ICD-10-CM | POA: Diagnosis present

## 2015-10-17 DIAGNOSIS — I509 Heart failure, unspecified: Secondary | ICD-10-CM

## 2015-10-17 DIAGNOSIS — Z951 Presence of aortocoronary bypass graft: Secondary | ICD-10-CM

## 2015-10-17 DIAGNOSIS — E1142 Type 2 diabetes mellitus with diabetic polyneuropathy: Secondary | ICD-10-CM | POA: Diagnosis present

## 2015-10-17 DIAGNOSIS — I442 Atrioventricular block, complete: Secondary | ICD-10-CM | POA: Diagnosis not present

## 2015-10-17 DIAGNOSIS — I5021 Acute systolic (congestive) heart failure: Secondary | ICD-10-CM | POA: Diagnosis not present

## 2015-10-17 DIAGNOSIS — R0602 Shortness of breath: Secondary | ICD-10-CM | POA: Diagnosis not present

## 2015-10-17 DIAGNOSIS — Z833 Family history of diabetes mellitus: Secondary | ICD-10-CM

## 2015-10-17 DIAGNOSIS — I5022 Chronic systolic (congestive) heart failure: Secondary | ICD-10-CM | POA: Diagnosis present

## 2015-10-17 HISTORY — DX: Cystitis, unspecified without hematuria: N30.90

## 2015-10-17 LAB — CBC WITH DIFFERENTIAL/PLATELET
BASOS PCT: 1 %
Basophils Absolute: 0.1 10*3/uL (ref 0.0–0.1)
EOS ABS: 0.2 10*3/uL (ref 0.0–0.7)
Eosinophils Relative: 3 %
HCT: 39.6 % (ref 36.0–46.0)
HEMOGLOBIN: 13.3 g/dL (ref 12.0–15.0)
Lymphocytes Relative: 28 %
Lymphs Abs: 2.3 10*3/uL (ref 0.7–4.0)
MCH: 30.6 pg (ref 26.0–34.0)
MCHC: 33.6 g/dL (ref 30.0–36.0)
MCV: 91 fL (ref 78.0–100.0)
Monocytes Absolute: 0.6 10*3/uL (ref 0.1–1.0)
Monocytes Relative: 8 %
NEUTROS ABS: 5.1 10*3/uL (ref 1.7–7.7)
NEUTROS PCT: 62 %
Platelets: 255 10*3/uL (ref 150–400)
RBC: 4.35 MIL/uL (ref 3.87–5.11)
RDW: 13.6 % (ref 11.5–15.5)
WBC: 8.3 10*3/uL (ref 4.0–10.5)

## 2015-10-17 LAB — COMPREHENSIVE METABOLIC PANEL
ALBUMIN: 3.8 g/dL (ref 3.5–5.0)
ALK PHOS: 77 U/L (ref 38–126)
ALT: 34 U/L (ref 14–54)
ALT: 33 U/L (ref 14–54)
ANION GAP: 13 (ref 5–15)
ANION GAP: 13 (ref 5–15)
AST: 39 U/L (ref 15–41)
AST: 33 U/L (ref 15–41)
Albumin: 3.7 g/dL (ref 3.5–5.0)
Alkaline Phosphatase: 79 U/L (ref 38–126)
BILIRUBIN TOTAL: 0.6 mg/dL (ref 0.3–1.2)
BUN: 17 mg/dL (ref 6–20)
BUN: 17 mg/dL (ref 6–20)
CALCIUM: 10 mg/dL (ref 8.9–10.3)
CALCIUM: 9.8 mg/dL (ref 8.9–10.3)
CHLORIDE: 105 mmol/L (ref 101–111)
CO2: 20 mmol/L — AB (ref 22–32)
CO2: 22 mmol/L (ref 22–32)
CREATININE: 0.96 mg/dL (ref 0.44–1.00)
Chloride: 104 mmol/L (ref 101–111)
Creatinine, Ser: 0.91 mg/dL (ref 0.44–1.00)
GFR calc Af Amer: >60 mL/min (ref >60)
GFR calc non Af Amer: >60 mL/min (ref >60)
GFR calc non Af Amer: 59 mL/min — ABNORMAL LOW (ref >60)
GLUCOSE: 236 mg/dL — AB (ref 65–99)
Glucose, Bld: 202 mg/dL — ABNORMAL HIGH (ref 65–99)
Potassium: 4.2 mmol/L (ref 3.5–5.1)
Potassium: 4.2 mmol/L (ref 3.5–5.1)
SODIUM: 138 mmol/L (ref 135–145)
SODIUM: 139 mmol/L (ref 135–145)
TOTAL PROTEIN: 7.2 g/dL (ref 6.5–8.1)
Total Bilirubin: 0.5 mg/dL (ref 0.3–1.2)
Total Protein: 7.1 g/dL (ref 6.5–8.1)

## 2015-10-17 LAB — GLUCOSE, CAPILLARY
GLUCOSE-CAPILLARY: 72 mg/dL (ref 65–99)
Glucose-Capillary: 257 mg/dL — ABNORMAL HIGH (ref 65–99)
Glucose-Capillary: 219 mg/dL — ABNORMAL HIGH (ref 65–99)
Glucose-Capillary: 113 mg/dL — ABNORMAL HIGH (ref 65–99)

## 2015-10-17 LAB — I-STAT CHEM 8, ED
BUN: 17 mg/dL (ref 6–20)
CHLORIDE: 107 mmol/L (ref 101–111)
Calcium, Ion: 1.28 mmol/L (ref 1.13–1.30)
Creatinine, Ser: 0.7 mg/dL (ref 0.44–1.00)
Glucose, Bld: 193 mg/dL — ABNORMAL HIGH (ref 65–99)
HEMATOCRIT: 41 % (ref 36.0–46.0)
Hemoglobin: 13.9 g/dL (ref 12.0–15.0)
Potassium: 4 mmol/L (ref 3.5–5.1)
SODIUM: 140 mmol/L (ref 135–145)
TCO2: 22 mmol/L (ref 0–100)

## 2015-10-17 LAB — TROPONIN I
TROPONIN I: 0.57 ng/mL — AB (ref <0.031)
Troponin I: 0.63 ng/mL (ref <0.031)
Troponin I: 0.59 ng/mL (ref <0.031)

## 2015-10-17 LAB — D-DIMER, QUANTITATIVE (NOT AT ARMC): D DIMER QUANT: 0.56 ug{FEU}/mL — AB (ref 0.00–0.48)

## 2015-10-17 LAB — BRAIN NATRIURETIC PEPTIDE: B NATRIURETIC PEPTIDE 5: 120.6 pg/mL — AB (ref 0.0–100.0)

## 2015-10-17 MED ORDER — OCUVITE PO TABS
1.0000 | ORAL_TABLET | Freq: Every day | ORAL | Status: DC
  Administered 2015-10-17 – 2015-10-22 (×5): 1 via ORAL
  Filled 2015-10-17 (×6): qty 1

## 2015-10-17 MED ORDER — ONDANSETRON HCL 4 MG/2ML IJ SOLN: 4.0000 mg | Freq: Four times a day (QID) | INTRAMUSCULAR | Status: DC | PRN

## 2015-10-17 MED ORDER — SODIUM CHLORIDE 0.9% FLUSH
3.0000 mL | Freq: Two times a day (BID) | INTRAVENOUS | Status: DC
  Administered 2015-10-17 – 2015-10-22 (×9): 3 mL via INTRAVENOUS

## 2015-10-17 MED ORDER — SODIUM CHLORIDE 0.9 % IV SOLN: 250.0000 mL | INTRAVENOUS | Status: DC | PRN

## 2015-10-17 MED ORDER — METOPROLOL TARTRATE 25 MG PO TABS
25.0000 mg | ORAL_TABLET | Freq: Two times a day (BID) | ORAL | Status: DC
  Administered 2015-10-17 – 2015-10-21 (×9): 25 mg via ORAL
  Filled 2015-10-17 (×9): qty 1
  Filled 2015-10-17: qty 2

## 2015-10-17 MED ORDER — ASPIRIN EC 81 MG PO TBEC
81.0000 mg | DELAYED_RELEASE_TABLET | Freq: Every day | ORAL | Status: DC
  Administered 2015-10-17 – 2015-10-21 (×5): 81 mg via ORAL
  Filled 2015-10-17 (×5): qty 1

## 2015-10-17 MED ORDER — PERFLUTREN LIPID MICROSPHERE
1.0000 mL | INTRAVENOUS | Status: AC | PRN
  Administered 2015-10-17: 1 mL via INTRAVENOUS
  Filled 2015-10-17: qty 10

## 2015-10-17 MED ORDER — IOHEXOL 350 MG/ML SOLN
100.0000 mL | Freq: Once | INTRAVENOUS | Status: AC | PRN
  Administered 2015-10-17: 100 mL via INTRAVENOUS

## 2015-10-17 MED ORDER — ACETAMINOPHEN 325 MG PO TABS: 650.0000 mg | ORAL_TABLET | ORAL | Status: DC | PRN

## 2015-10-17 MED ORDER — ENOXAPARIN SODIUM 40 MG/0.4ML ~~LOC~~ SOLN
40.0000 mg | SUBCUTANEOUS | Status: DC
  Administered 2015-10-17 – 2015-10-20 (×4): 40 mg via SUBCUTANEOUS
  Filled 2015-10-17 (×5): qty 0.4

## 2015-10-17 MED ORDER — SODIUM CHLORIDE 0.9% FLUSH: 3.0000 mL | INTRAVENOUS | Status: DC | PRN

## 2015-10-17 MED ORDER — INSULIN ASPART 100 UNIT/ML ~~LOC~~ SOLN
0.0000 [IU] | Freq: Three times a day (TID) | SUBCUTANEOUS | Status: DC
  Administered 2015-10-17: 11 [IU] via SUBCUTANEOUS

## 2015-10-17 MED ORDER — FUROSEMIDE 10 MG/ML IJ SOLN
80.0000 mg | Freq: Two times a day (BID) | INTRAMUSCULAR | Status: DC
  Administered 2015-10-17 (×2): 80 mg via INTRAVENOUS
  Filled 2015-10-17 (×2): qty 8

## 2015-10-17 MED ORDER — LISINOPRIL 40 MG PO TABS
40.0000 mg | ORAL_TABLET | Freq: Every day | ORAL | Status: DC
  Administered 2015-10-17 – 2015-10-22 (×6): 40 mg via ORAL
  Filled 2015-10-17 (×6): qty 1

## 2015-10-17 MED ORDER — INSULIN ASPART 100 UNIT/ML ~~LOC~~ SOLN
150.0000 [IU] | Freq: Three times a day (TID) | SUBCUTANEOUS | Status: DC
  Administered 2015-10-17 – 2015-10-20 (×8): 150 [IU] via SUBCUTANEOUS

## 2015-10-17 MED ORDER — NITROGLYCERIN IN D5W 200-5 MCG/ML-% IV SOLN
20.0000 ug/min | INTRAVENOUS | Status: DC
  Administered 2015-10-17: 50 ug/min via INTRAVENOUS
  Filled 2015-10-17: qty 250

## 2015-10-17 MED ORDER — POTASSIUM CHLORIDE CRYS ER 20 MEQ PO TBCR
20.0000 meq | EXTENDED_RELEASE_TABLET | Freq: Two times a day (BID) | ORAL | Status: DC
  Administered 2015-10-17 – 2015-10-22 (×11): 20 meq via ORAL
  Filled 2015-10-17 (×11): qty 1

## 2015-10-17 MED ORDER — FUROSEMIDE 10 MG/ML IJ SOLN
80.0000 mg | Freq: Once | INTRAMUSCULAR | Status: AC
  Administered 2015-10-17: 80 mg via INTRAVENOUS
  Filled 2015-10-17: qty 8

## 2015-10-17 NOTE — ED Notes (Signed)
Called report x1. 

## 2015-10-17 NOTE — Care Management Note (Signed)
Case Management Note  Patient Details  Name: Laurie [name] MRN: [id number] Date of Birth: 1946/08/10  Subjective/Objective:                    Action/Plan: Patient was admitted with acute shortness of breath. Will follow for discharge needs pending PT/OT evals and physician orders.  Expected Discharge Date:                  Expected Discharge Plan:     In-House Referral:     Discharge planning Services     Post Acute Care Choice:    Choice offered to:     DME Arranged:    DME Agency:     HH Arranged:    HH Agency:     Status of Service:  In process, will continue to follow  Medicare Important Message Given:    Date Medicare IM Given:    Medicare IM give by:    Date Additional Medicare IM Given:    Additional Medicare Important Message give by:     If discussed at [place] of Stay Meetings, dates discussed:    Additional CommentsRolm Baptise, RN 10/17/2015, 2:43 PM [phone]

## 2015-10-17 NOTE — Progress Notes (Signed)
Patient taken off of Bipap and placed on 4L nasal cannula.  Sats currently 93%.  Will continue to monitor.

## 2015-10-17 NOTE — ED Notes (Signed)
Notified Cardiology that pt's pressure has improved- would he like for lasix to be given now. MD ordered to go ahead and give lasix.

## 2015-10-17 NOTE — ED Provider Notes (Signed)
CSN: [id number]     Arrival date & time 10/17/15  0346 History  By signing my name below, I, Meriel Pica, attest that this documentation has been prepared under the direction and in the presence of Everlene Balls, MD. Electronically Signed: Meriel Pica, ED Scribe. 10/17/2015. 4:02 AM.   Chief Complaint  Patient presents with  . Shortness of Breath   The history is provided by the patient. No language interpreter was used.   HPI Comments: Laurie [name] is a 70 y.o. female who presents to the Emergency Department complaining of acute onset, constant SOB. The pt was seen by her PCP 1 day ago for sinusitis when she mentioned chest discomfort and received a negative workup, with the exception of D-dimer which is pending result. She notes her symptoms of chest discomfort, a non productive cough and congestion have been present for several weeks. Pt has been febrile at home with a Tmax of 100.25F.  She states it is difficult to take a deep breath but not painful. She is not on home O2. No sick contacts. No h/o PE or DVT. Denies pain or edema in BLE.   Past Medical History  Diagnosis Date  . Bell's palsy 01/05/2010  . Multinodular goiter   . Hepatitis   . Thyromegaly     mild  . 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 Hyperlipidemia     Intolerance to several statins   . Hypertensive heart disease without CHF   . HIstory of Bell's palsy     October 2012   . Obesity (BMI 30-39.9)   . Peripheral neuropathy ([place])   . Sleep apnea   . CAD (coronary artery disease)     Cath  07/21/1999  mild ostial, 80% stenosis proximal LAD, 90% stenosis proximal Diag 1, 95% stenosis proximal OM 1, 80% stenosis proximal RCA, 95% stenosis mid RCA    CABG 07/22/99  LIMA to LAD, SVG to dx, SVG to OM, SVG to PDA  Dr. Carmin Muskrat 2012 no ischemia EF 67%   . Seasonal allergies   . Cataracts, bilateral   . Hypertension   . Pacemaker    Past Surgical  History  Procedure Laterality Date  . Coronary artery bypass graft  2000  . Tubal ligation    . Biopsy thyroid  05/2010    percutaneous  . Foot surgery    . Carpal tunnel release    . Dental surgery    . Temporary pacemaker insertion N/A 10/22/2012    Procedure: TEMPORARY PACEMAKER INSERTION;  Surgeon: Troy Sine, MD;  Location: [facility] CATH LAB;  Service: Cardiovascular;  Laterality: N/A;  . Left heart catheterization with coronary angiogram N/A 10/23/2012    Procedure: LEFT HEART CATHETERIZATION WITH CORONARY ANGIOGRAM;  Surgeon: Jacolyn Reedy, MD;  Location: [facility] CATH LAB;  Service: Cardiovascular;  Laterality: N/A;  . Permanent pacemaker insertion N/A 10/23/2012    Procedure: PERMANENT PACEMAKER INSERTION;  Surgeon: Deboraha Sprang, MD;  Location: [facility] CATH LAB;  Service: Cardiovascular;  Laterality: N/A;  . Lead revision N/A 10/25/2012    Procedure: LEAD REVISION;  Surgeon: Evans Lance, MD;  Location: [facility] CATH LAB;  Service: Cardiovascular;  Laterality: N/A;  . Eye surgery    . Cataract extraction w/ intraocular lens implant Left 12/16/2014  . Cataract extraction w/ intraocular lens  implant, bilateral Right 01/06/2015  . Dg 3rd digit right hand      cystectomy  Family History  Problem Relation Age of Onset  . Diabetes Father   . Heart disease Father   . Diabetes Other     Siblings and 2 children  . Heart disease Other     CAD  . Diabetes Mother   . Colon cancer Neg Hx   . Colon polyps Neg Hx    Social History  Substance Use Topics  . Smoking status: Never Smoker   . Smokeless tobacco: Never Used  . Alcohol Use: No   OB History    No data available     Review of Systems  Constitutional: Positive for fever.  HENT: Positive for congestion.   Respiratory: Positive for cough and shortness of breath.   Cardiovascular: Negative for chest pain and leg swelling.   A complete 10 system review of systems was obtained and is otherwise negative except at noted in the HPI and  PMH.  Allergies  Statins and Tricor  Home Medications   Prior to Admission medications   Medication Sig Start Date End Date Taking? Authorizing Provider  ACCU-CHEK FASTCLIX LANCETS MISC USE TO CHECK BLOOD SUGAR 4 TIMES A DAY 11/29/14   Historical Provider, MD  ACCU-CHEK SMARTVIEW test strip USE  TO CHECK BLOOD SUGAR FOUR TIMES DAILY 06/16/15   Renato Shin, MD  Alcohol Swabs (B-D SINGLE USE SWABS REGULAR) PADS Use 8 daily for testing blood sugar and insulin injections. 09/12/14   Renato Shin, MD  AMBULATORY NON FORMULARY MEDICATION Reported on 10/03/2015    Historical Provider, MD  AMBULATORY NON FORMULARY MEDICATION Omega Q Plus - Take 1 capsule once daily    Historical Provider, MD  aspirin EC 81 MG EC tablet Take 1 tablet (81 mg total) by mouth daily. 10/24/12   Jacolyn Reedy, MD  b complex vitamins tablet Take 1 tablet by mouth daily.    Historical Provider, MD  BD INSULIN SYRINGE ULTRAFINE 31G X 15/64" 1 ML MISC USE SYRINGES 10 TIMES A DAY WITH INSULIN INJECTIONS AS DIRECTED. 09/26/15   Renato Shin, MD  beta carotene w/minerals (OCUVITE) tablet Take 1 tablet by mouth daily.    Historical Provider, MD  Blood Glucose Calibration (ACCU-CHEK SMARTVIEW CONTROL) LIQD Use to calibrate blood glucose meter. 09/12/14   Renato Shin, MD  cholecalciferol (VITAMIN D) 1000 UNITS tablet Take 2,000 Units by mouth daily.    Historical Provider, MD  fexofenadine (ALLEGRA) 180 MG tablet Take 180 mg by mouth daily.    Historical Provider, MD  furosemide (LASIX) 40 MG tablet TAKE 1 AND 1/2 TABLETS EVERY DAY AS NEEDED 12/12/14   Deboraha Sprang, MD  ketoconazole (NIZORAL) 2 % cream APPLY TOPICALLY AS NEEDED 08/12/15   Marletta Lor, MD  KLOR-CON M20 20 MEQ tablet TAKE ONE TABLET BY MOUTH TWO TIMES DAILY. 04/24/15   Marletta Lor, MD  lisinopril (PRINIVIL,ZESTRIL) 40 MG tablet Take 1 tablet (40 mg total) by mouth daily. 08/22/15   Marletta Lor, MD  metoprolol tartrate (LOPRESSOR) 25 MG tablet  Take 1 tablet (25 mg total) by mouth 2 (two) times daily. 10/16/15   Marletta Lor, MD  NOVOLIN R RELION 100 UNIT/ML injection INJECT 150 UNITS WITH BREAKFAST,270 UNITS WITH [id number] UNITS WITH THE EVENING MEAL Patient taking differently: INJECT 120 UNITS WITH BREAKFAST,170 UNITS WITH LUNCH,150 UNITS WITH THE EVENING MEAL 09/26/15   Renato Shin, MD  Oxymetazoline HCl (AFRIN NODRIP ORIGINAL NA) Place into the nose. Reported on 10/03/2015    Historical Provider, MD  Probiotic Product (PROBIOTIC  DAILY) CAPS Take 1 capsule by mouth daily.    Historical Provider, MD  [place] 1CC/30G 30G X 5/16" 1 ML MISC  01/09/15   Historical Provider, MD  sodium chloride (OCEAN) 0.65 % nasal spray Place 1 spray into the nose as needed for congestion.    Historical Provider, MD  verapamil (VERELAN PM) 120 MG 24 hr capsule TAKE ONE TABLET BY MOUTH ONCE DAILY 06/25/14   Sueanne Margarita, MD   BP 143/78 mmHg  Pulse 91  Temp(Src) 98.8 F (37.1 C) (Oral)  Resp 19  Ht 5' 2.5" (1.588 m)  Wt 214 lb (97.07 kg)  BMI 38.49 kg/m2  SpO2 91% Physical Exam  Constitutional: She is oriented to person, place, and time. She appears well-developed and well-nourished. No distress.  HENT:  Head: Normocephalic and atraumatic.  Nose: Nose normal.  Mouth/Throat: Oropharynx is clear and moist. No oropharyngeal exudate.  Eyes: Conjunctivae and EOM are normal. Pupils are equal, round, and reactive to light. No scleral icterus.  Neck: Normal range of motion. Neck supple. No JVD present. No tracheal deviation present. No thyromegaly present.  Cardiovascular: Normal rate, regular rhythm and normal heart sounds.  Exam reveals no gallop and no friction rub.   No murmur heard. Pulmonary/Chest: Breath sounds normal. Tachypnea noted. No respiratory distress. She has no wheezes. She exhibits no tenderness.  Tachypnea, right grater than left lower extremity edema. 85% O2 saturation on room air.   Abdominal: Soft. Bowel sounds are  normal. She exhibits no distension and no mass. There is no tenderness. There is no rebound and no guarding.  Musculoskeletal: Normal range of motion. She exhibits edema. She exhibits no tenderness.  Lymphadenopathy:    She has no cervical adenopathy.  Neurological: She is alert and oriented to person, place, and time. No cranial nerve deficit. She exhibits normal muscle tone.  Skin: Skin is warm and dry. No rash noted. No erythema. No pallor.  Nursing note and vitals reviewed.   ED Course  Procedures  DIAGNOSTIC STUDIES: Oxygen Saturation is 85% on room air O2, low by my interpretation.    COORDINATION OF CARE: 3:56 AM Discussed treatment plan  with pt. Pt acknowledges and agrees to plan.   Labs Review Labs Reviewed  COMPREHENSIVE METABOLIC PANEL - Abnormal; Notable for the following:    CO2 20 (*)    Glucose, Bld 202 (*)    All other components within normal limits  BRAIN NATRIURETIC PEPTIDE - Abnormal; Notable for the following:    B Natriuretic Peptide 120.6 (*)    All other components within normal limits  I-STAT CHEM 8, ED - Abnormal; Notable for the following:    Glucose, Bld 193 (*)    All other components within normal limits  CBC WITH DIFFERENTIAL/PLATELET    Imaging Review Dg Chest 2 View  10/16/2015  CLINICAL DATA:  Resting tachycardia with SOB x 3 months, congestion with no cough, HTN, DM, CAD, hx CABG 2000, pacemaker inserted 2014, there are no other chest complaints EXAM: CHEST  2 VIEW COMPARISON:  10/26/2012 FINDINGS: Cardiac silhouette is top-normal size. Changes CABG surgery are stable. Left anterior chest wall sequential pacemaker leads are also stable and well positioned. No mediastinal or hilar masses or convincing adenopathy. There is mild central vascular congestion with mild interstitial, the latter most evident the lower lungs. There is no focal consolidation to suggest pneumonia. No pleural effusion or pneumothorax. Bony thorax is demineralized. There stable  minor wedge-shaped compression deformity of a mid  thoracic vertebrae. The IMPRESSION: 1. Central vascular congestion and mild interstitial thickening is consistent mild congestive heart failure. No evidence of pneumonia. Electronically Signed   By: Lajean Manes M.D.   On: 10/16/2015 13:52   Ct Angio Chest Pe W/cm &/or Wo Cm  10/17/2015  CLINICAL DATA:  Shortness of breath EXAM: CT ANGIOGRAPHY CHEST WITH CONTRAST TECHNIQUE: Multidetector CT imaging of the chest was performed using the standard protocol during bolus administration of intravenous contrast. Multiplanar CT image reconstructions and MIPs were obtained to evaluate the vascular anatomy. CONTRAST:  115mL OMNIPAQUE IOHEXOL 350 MG/ML SOLN COMPARISON:  Chest x-ray from yesterday FINDINGS: THORACIC INLET/BODY WALL: Dual-chamber pacer leads from the left are in unremarkable position. MEDIASTINUM: Chronic cardiomegaly. No pericardial effusion. No left heart or systemic arterial enhancement. No evidence of acute aortic disease. No evidence of pulmonary embolism. Status post CABG. LUNG WINDOWS: Diffuse septal thickening with perihilar airspace opacity and small layering effusions. UPPER ABDOMEN: Intrahepatic venous reflux of contrast. OSSEOUS: No acute fracture.  No suspicious lytic or blastic lesions. Review of the MIP images confirms the above findings. IMPRESSION: 1. CHF. 2. No evidence of pulmonary embolism. Electronically Signed   By: Monte Fantasia M.D.   On: 10/17/2015 05:52   I have personally reviewed and evaluated these images and lab results as part of my medical decision-making.   EKG Interpretation   Date/Time:  Friday October 17 2015 03:58:44 EST Ventricular Rate:  89 PR Interval:  205 QRS Duration: 160 QT Interval:  413 QTC Calculation: 503 R Axis:   -94 Text Interpretation:  Sinus rhythm Nonspecific IVCD with LAD Baseline  wander in lead(s) II V1 V2 V3 V4 V5 V6 Prolonged QT no longer paced  currently Confirmed by Glynn Octave [phone]) on 10/17/2015 4:17:34  AM      MDM   Final diagnoses:  None   Patient presents to the ED for SOB.  Seen at PCP where they were worried about PE.  Labs were unremarkable.  Blood work was repeated and CTA obtained here for evaluation, only reveals fluid overload.  Patient became more hypoxic in the ED requiring Bipap and was placed on nitro gtt.  Cardiology paged for admission.  I spoke with Dr. Chalmers Cater accepts the patient for admission.  He is requesting IV Lasix be given which was ordered.    CRITICAL CARE Performed by: Everlene Balls   Total critical care time: 45 minutes - CHF exacerbation requiring bipap  Critical care time was exclusive of separately billable procedures and treating other patients.  Critical care was necessary to treat or prevent imminent or life-threatening deterioration.  Critical care was time spent personally by me on the following activities: development of treatment plan with patient and/or surrogate as well as nursing, discussions with consultants, evaluation of patient's response to treatment, examination of patient, obtaining history from patient or surrogate, ordering and performing treatments and interventions, ordering and review of laboratory studies, ordering and review of radiographic studies, pulse oximetry and re-evaluation of patient's condition.    I personally performed the services described in this documentation, which was scribed in my presence. The recorded information has been reviewed and is accurate.      Everlene Balls, MD 10/17/15 [phone]

## 2015-10-17 NOTE — ED Notes (Signed)
Pt states she is been feeling very SOB for the past 3 days, she when to her PCP got some cxr done and sent home, but she still don't have relieve and is not been able to sleep. Pt denies any cp at this time.

## 2015-10-17 NOTE — ED Notes (Signed)
Respiratory at bedside placing pt on bipap

## 2015-10-17 NOTE — ED Notes (Signed)
Respiratory trialing pt off Bipap. Pt's sats 94% on room air

## 2015-10-17 NOTE — ED Notes (Signed)
Spoke with Claudine Mouton MD, stated to dec nitro drip to 91mcg/min.

## 2015-10-17 NOTE — ED Notes (Signed)
Called respiratory, stated that pt condition is continuing to deteriorate. Pt work of breathing is inc and becoming worse. EDP at bedside

## 2015-10-17 NOTE — ED Notes (Addendum)
Upon pt returning from CT, pt noted to be in more distress. O2 88% on 6L . Pt placed on NRB 15L, O2 sats rised to 93%. EDP notified

## 2015-10-17 NOTE — ED Notes (Signed)
Got a repeat EKG, given to [facility] MD

## 2015-10-17 NOTE — H&P (Signed)
History and Physical   Admit date: 10/17/2015 Name:  Laurie [name] Medical record number: [id number] DOB/Age:  70/12/1945  70 y.o. female  Referring Physician:   [facility]  Primary Cardiologist:  Wynonia Lawman  Primary Physician:   Orvilla Fus  Chief complaint/reason for admission: Shortness of breath  HPI:  This very nice 70 year old female has a history of coronary artery disease with previous bypass grafting.  She had bypass grafting for three-vessel disease and 2000 and had patent grafts at catheterization 3 years ago when she was admitted with high degree AV block and complete heart block.  At that time she had a [employer] pacemaker implanted.  She has had some occasional atrial arrhythmias since then that have been controlled with verapamil.  She has severe diabetes mellitus with insulin resistance.  She had been doing well until November when she had a flu and pneumonia shot.  Following that she began to have vague discomfort in her shoulders as well as her neck that would occur both with exertion and at rest and was described as a raw feeling.  She also noted some sinusitis and eventually saw her primary care doctor's office on the 20th was treated with amoxicillin.  The time she had her lung drainage from her nose as well as a low grade fever.  She got feeling somewhat better with that but also had some shortness of breath.  She began to have a raw feeling in her chest that was different than her previous symptoms of aching that she had prior to her bypass grafting.  She saw her primary physician again yesterday noted complaints of vague neck discomfort and swollen tonsils.  She had stopped taking her Lasix at home and had been sleeping in a recliner and had some PND as well as some dyspnea on exertion.  She had complained of low-grade fever as well as weight gain although her weight had not changed significantly since November.  He was noted to have a resting tachycardia and was  scheduled to have a chest x-ray.  D-dimer was minimally elevated at 0.56.  She presented to the emergency room this morning when she developed severe shortness of breath and despite wearing her seat Pap she complained of a raw feeling in her chest and was using a heating pad on her chest and came to the emergency room.  A CTA did not show any evidence of pulmonary emboli and showed possible pulmonary edema.  She had oxygen desaturation and was placed on BiPAP and nitroglycerin.  No Lasix was given by the emergency room physician.  She is admitted at this time for treatment of volume overload and congestive heart failure.  EKG currently shows a paced rhythm.  No troponin was done on presentation to the emergency room.   Past Medical History  Diagnosis Date  . Bell's palsy 01/05/2010  . Multinodular goiter   . Hepatitis   . Thyromegaly     mild  . 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 Hyperlipidemia     Intolerance to several statins   . Hypertensive heart disease without CHF   . HIstory of Bell's palsy     October 2012   . Obesity (BMI 30-39.9)   . Peripheral neuropathy ([place])   . Sleep apnea   . CAD (coronary artery disease)     Cath  07/21/1999  mild ostial, 80% stenosis proximal LAD, 90% stenosis proximal Diag 1, 95% stenosis proximal  OM 1, 80% stenosis proximal RCA, 95% stenosis mid RCA    CABG 07/22/99  LIMA to LAD, SVG to dx, SVG to OM, SVG to PDA  Dr. Carmin Muskrat 2012 no ischemia EF 67%   . Seasonal allergies   . Cataracts, bilateral   . Hypertension   . Pacemaker      Past Surgical History  Procedure Laterality Date  . Coronary artery bypass graft  2000  . Tubal ligation    . Biopsy thyroid  05/2010    percutaneous  . Foot surgery    . Carpal tunnel release    . Dental surgery    . Temporary pacemaker insertion N/A 10/22/2012    Procedure: TEMPORARY PACEMAKER INSERTION;  Surgeon: Troy Sine, MD;  Location: [facility]  CATH LAB;  Service: Cardiovascular;  Laterality: N/A;  . Left heart catheterization with coronary angiogram N/A 10/23/2012    Procedure: LEFT HEART CATHETERIZATION WITH CORONARY ANGIOGRAM;  Surgeon: Jacolyn Reedy, MD;  Location: [facility] CATH LAB;  Service: Cardiovascular;  Laterality: N/A;  . Permanent pacemaker insertion N/A 10/23/2012    Procedure: PERMANENT PACEMAKER INSERTION;  Surgeon: Deboraha Sprang, MD;  Location: [facility] CATH LAB;  Service: Cardiovascular;  Laterality: N/A;  . Lead revision N/A 10/25/2012    Procedure: LEAD REVISION;  Surgeon: Evans Lance, MD;  Location: [facility] CATH LAB;  Service: Cardiovascular;  Laterality: N/A;  . Eye surgery    . Cataract extraction w/ intraocular lens implant Left 12/16/2014  . Cataract extraction w/ intraocular lens  implant, bilateral Right 01/06/2015  . Dg 3rd digit right hand      cystectomy   Allergies: is allergic to statins and tricor.   Medications: Prior to Admission medications   Medication Sig Start Date End Date Taking? Authorizing Provider  ACCU-CHEK SMARTVIEW test strip USE  TO CHECK BLOOD SUGAR FOUR TIMES DAILY 06/16/15  Yes Renato Shin, MD  Alcohol Swabs (B-D SINGLE USE SWABS REGULAR) PADS Use 8 daily for testing blood sugar and insulin injections. 09/12/14  Yes Renato Shin, MD  AMBULATORY NON FORMULARY MEDICATION Take 1 capsule by mouth daily. Omega Q Plus - Take 1 capsule once daily   Yes Historical Provider, MD  aspirin EC 81 MG EC tablet Take 1 tablet (81 mg total) by mouth daily. 10/24/12  Yes Jacolyn Reedy, MD  b complex vitamins tablet Take 1 tablet by mouth daily.   Yes Historical Provider, MD  BD INSULIN SYRINGE ULTRAFINE 31G X 15/64" 1 ML MISC USE SYRINGES 10 TIMES A DAY WITH INSULIN INJECTIONS AS DIRECTED. 09/26/15  Yes Renato Shin, MD  beta carotene w/minerals (OCUVITE) tablet Take 1 tablet by mouth daily.   Yes Historical Provider, MD  Blood Glucose Calibration (ACCU-CHEK SMARTVIEW CONTROL) LIQD Use to calibrate blood glucose  meter. 09/12/14  Yes Renato Shin, MD  cholecalciferol (VITAMIN D) 1000 UNITS tablet Take 2,000 Units by mouth daily.   Yes Historical Provider, MD  fexofenadine (ALLEGRA) 180 MG tablet Take 180 mg by mouth daily.   Yes Historical Provider, MD  furosemide (LASIX) 40 MG tablet TAKE 1 AND 1/2 TABLETS EVERY DAY AS NEEDED Patient taking differently: TAKE 1 AND 1/2 TABLETS EVERY DAY. 12/12/14  Yes Deboraha Sprang, MD  ketoconazole (NIZORAL) 2 % cream APPLY TOPICALLY AS NEEDED Patient taking differently: Apply 1 application topically daily as needed for irritation.  08/12/15  Yes Marletta Lor, MD  KLOR-CON M20 20 MEQ tablet TAKE ONE TABLET BY MOUTH TWO TIMES DAILY.  04/24/15  Yes Marletta Lor, MD  lisinopril (PRINIVIL,ZESTRIL) 40 MG tablet Take 1 tablet (40 mg total) by mouth daily. 08/22/15  Yes Marletta Lor, MD  metoprolol tartrate (LOPRESSOR) 25 MG tablet Take 1 tablet (25 mg total) by mouth 2 (two) times daily. 10/16/15  Yes Marletta Lor, MD  NOVOLIN R RELION 100 UNIT/ML injection INJECT 150 UNITS WITH BREAKFAST,270 UNITS WITH LUNCH,340 UNITS WITH THE EVENING MEAL Patient taking differently: INJECT 150 UNITS WITH BREAKFAST,170 UNITS WITH LUNCH,150 UNITS WITH THE EVENING MEAL 09/26/15  Yes Renato Shin, MD  Oxymetazoline HCl (AFRIN NODRIP ORIGINAL NA) Place 1-2 sprays into the nose daily as needed (CONGESTION). Reported on 10/03/2015   Yes Historical Provider, MD  Probiotic Product (PROBIOTIC DAILY) CAPS Take 1 capsule by mouth daily.   Yes Historical Provider, MD  sodium chloride (OCEAN) 0.65 % nasal spray Place 1 spray into the nose as needed for congestion.   Yes Historical Provider, MD  verapamil (VERELAN PM) 120 MG 24 hr capsule TAKE ONE TABLET BY MOUTH ONCE DAILY 06/25/14  Yes Sueanne Margarita, MD   Family History:  Family Status  Relation Status Death Age  . Father Deceased 77  . Mother Deceased 76    complications of diabetes, coronary arterty disease   Social History:    reports that she has never smoked. She has never used smokeless tobacco. She reports that she does not drink alcohol or use illicit drugs.   Social History   Social History Narrative   Does not work outside the home     Review of Systems:  She is been obese for many years and unable to lose significant weight.  Had cataract surgery earlier in the year.  Does not give much of a regular exercise.  No recent GI complaints or GI bleeding.  She complained of hematuria recently as well as dysuria that went away when she was treated with amoxicillin for the sinusitis and possible upper respiratory infection.  Has also complained of vague shoulder and neck symptoms as noted above.  Complains of numbness in her lower extremities from previous neuropathy.   Other than as noted above, the remainder of the review of systems is normal  Physical Exam: BP 124/79 mmHg  Pulse 87  Temp(Src) 98.8 F (37.1 C) (Oral)  Resp 20  Ht 5' 2.5" (1.588 m)  Wt 97.024 kg (213 lb 14.4 oz)  BMI 38.47 kg/m2  SpO2 97% General appearance: Obese pleasant white female who currently is tachypneic Head: Normocephalic, without obvious abnormality, atraumatic Eyes: conjunctivae/corneas clear. PERRL, EOM's intact. Fundi not examined Neck: no adenopathy, no carotid bruit, no JVD, supple, symmetrical, trachea midline and JVD is difficult to assess Lungs: Bibasilar crackles  Heart: Mildly tachycardic, normal S1 and S2, no S3, no murmur Abdomen: soft, non-tender; bowel sounds normal; no masses,  no organomegaly and Very large Pelvic: deferred Extremities: Previous scars of saphenous vein harvesting, 2+ edema present, normal range of motion Pulses: 2+ and symmetric Skin: Skin color, texture, turgor normal. No rashes or lesions Neurologic: Grossly normal  Labs: CBC  Recent Labs  10/17/15 0410 10/17/15 0504  WBC 8.3  --   RBC 4.35  --   HGB 13.3 13.9  HCT 39.6 41.0  PLT 255  --   MCV 91.0  --   MCH 30.6  --   MCHC  33.6  --   RDW 13.6  --   LYMPHSABS 2.3  --   MONOABS 0.6  --  EOSABS 0.2  --   BASOSABS 0.1  --    CMP   Recent Labs  10/17/15 0410 10/17/15 0504  NA 138 140  K 4.2 4.0  CL 105 107  CO2 20*  --   GLUCOSE 202* 193*  BUN 17 17  CREATININE 0.91 0.70  CALCIUM 10.0  --   PROT 7.1  --   ALBUMIN 3.7  --   AST 39  --   ALT 34  --   ALKPHOS 79  --   BILITOT 0.5  --   GFRNONAA >60  --   GFRAA >60  --    BNP (last 3 results)    Component Value Date/Time   BNP 120.6* 10/17/2015 0409   Cardiac Panel (last 3 results) Troponin (Point of Care Test) Not ordered by emergency room physician Thyroid  Lab Results  Component Value Date   TSH 1.59 10/16/2015    EKG: Sinus tachycardia with ventricular paced rhythm  Radiology: Pacemaker leads intact, mild cardiomegaly, mild congestive heart failure   IMPRESSIONS: 1.  Acute shortness of breath that may be congestive heart failure although BNP is low.  Clinical syndrome suggests that she could also have a recent viral pneumonitis.  Previous LV function has been good. 2.  Functioning permanent pacemaker-initial indications were high degree AV block  3.  Vague shoulder neck and chest symptoms that could represent an anginal equivalent 4.  Diabetes mellitus insulin-dependent with severe insulin resistance 5.  Hypertensive heart disease 6.  Hyperlipidemia with statin intolerance  PLAN: She will be diuresed and we will have an echocardiogram later on today.  Obtain serial troponins.  Continue insulin for diabetes management.  Signed: Kerry Hough MD [facility]  10/17/2015, 7:53 AM

## 2015-10-17 NOTE — Progress Notes (Signed)
Echocardiogram 2D Echocardiogram with Definity has been performed.  Tresa Res 10/17/2015, 4:45 PM

## 2015-10-18 LAB — BASIC METABOLIC PANEL
ANION GAP: 16 — AB (ref 5–15)
BUN: 26 mg/dL — ABNORMAL HIGH (ref 6–20)
CALCIUM: 9.6 mg/dL (ref 8.9–10.3)
CHLORIDE: 101 mmol/L (ref 101–111)
CO2: 23 mmol/L (ref 22–32)
CREATININE: 1.05 mg/dL — AB (ref 0.44–1.00)
GFR calc Af Amer: >60 mL/min (ref >60)
GFR calc non Af Amer: 53 mL/min — ABNORMAL LOW (ref >60)
GLUCOSE: 166 mg/dL — AB (ref 65–99)
Potassium: 4.7 mmol/L (ref 3.5–5.1)
Sodium: 140 mmol/L (ref 135–145)

## 2015-10-18 LAB — GLUCOSE, CAPILLARY
GLUCOSE-CAPILLARY: 125 mg/dL — AB (ref 65–99)
GLUCOSE-CAPILLARY: 161 mg/dL — AB (ref 65–99)
GLUCOSE-CAPILLARY: 133 mg/dL — AB (ref 65–99)
GLUCOSE-CAPILLARY: 110 mg/dL — AB (ref 65–99)
Glucose-Capillary: 178 mg/dL — ABNORMAL HIGH (ref 65–99)
Glucose-Capillary: 119 mg/dL — ABNORMAL HIGH (ref 65–99)

## 2015-10-18 MED ORDER — FUROSEMIDE 10 MG/ML IJ SOLN
40.0000 mg | Freq: Two times a day (BID) | INTRAMUSCULAR | Status: DC
  Administered 2015-10-18 (×2): 40 mg via INTRAVENOUS
  Filled 2015-10-18 (×2): qty 4

## 2015-10-18 NOTE — Progress Notes (Signed)
Subjective:  She feels the best that she has felt in months.  Diuresed significantly overnight.  Further review of history shows that metoprolol was recently added to her regimen by primary doctor.  She previously had severe fatigue on this.  Echo yesterday shows a reduction in EF from 2014.  EF is now around 30-35% with significant dyssynchrony.  She could have pacemaker-induced ventricular dysfunction.  Troponins are flat and low level that may represent CHF.  Objective:  Vital Signs in the last 24 hours: BP 103/51 mmHg  Pulse 78  Temp(Src) 98.1 F (36.7 C) (Oral)  Resp 18  Ht 5' 2.5" (1.588 m)  Wt 93.305 kg (205 lb 11.2 oz)  BMI 37.00 kg/m2  SpO2 95%  Physical Exam: Obese pleasant female in no acute distress Lungs:  Clear Cardiac:  Regular rhythm, normal S1 and S2, no S3 Extremities:   trace  edema present  Intake/Output from previous day: 02/03 0701 - 02/04 0700 In: 800 [P.O.:800] Out: 1950 [Urine:1950]  Weight Filed Weights   10/17/15 0749 10/17/15 0945 10/18/15 0407  Weight: 97.024 kg (213 lb 14.4 oz) 94.666 kg (208 lb 11.2 oz) 93.305 kg (205 lb 11.2 oz)    Lab Results: Basic Metabolic Panel:  Recent Labs  10/17/15 0832 10/18/15 0333  NA 139 140  K 4.2 4.7  CL 104 101  CO2 22 23  GLUCOSE 236* 166*  BUN 17 26*  CREATININE 0.96 1.05*   CBC:  Recent Labs  10/16/15 1050 10/17/15 0410 10/17/15 0504  WBC 7.9 8.3  --   NEUTROABS 5.3 5.1  --   HGB 12.7 13.3 13.9  HCT 39.4 39.6 41.0  MCV 91.5 91.0  --   PLT 252.0 255  --     Cardiac Panel (last 3 results)  Recent Labs  10/17/15 0832 10/17/15 1419 10/17/15 2054  TROPONINI 0.63* 0.59* 0.57*    Telemetry: Sinus with ventricular paced beats  Assessment/Plan:  1.  Acute on chronic systolic congestive heart failure may be due to right ventricular pacemaking  2.  Ventricular pacemaker with 100% RV capture 3.  Elevation of troponin may be due to congestive heart failure doubt ischemia from talking  to her but may need reassessment of her grafts.  Grafts were previously patent 2 years ago  Recommendations:  Metoprolol was recently added to her regimen.  I'm going to stop her verapamil which has negative inotropic effects in view of the reduced LV function.  Continued diuresis overnight and discontinue IV nitroglycerin.  Hopefully symptoms will improve.  May need to have upgrade to biventricular pacemaker following stabilization with medication withdrawal.       W. Doristine Church  MD [facility]  10/18/2015, 9:17 AM

## 2015-10-18 NOTE — Evaluation (Signed)
Physical Therapy Evaluation Patient Details Name: Laurie [name] MRN: [id number] DOB: Dec 26, 1945 Today's Date: 10/18/2015   History of Present Illness  70 y.o. female who presents to the Emergency Department complaining of acute onset, constant SOB  Clinical Impression  Patient seen for evaluation and oxygen saturation assessment. Patient ambulated on room air with HR in the 100s and saturations >93% throughout activity. 97% at rest. Educated on energy conservation and signs of volume increase. Patient receptive, no further acute PT needs, will sign off.    Follow Up Recommendations No PT follow up    Equipment Recommendations  None recommended by PT    Recommendations for Other Services       Precautions / Restrictions Precautions Precautions:  (watch HR and O2)      Mobility  Bed Mobility               General bed mobility comments: received in chair  Transfers Overall transfer level: Independent                  Ambulation/Gait Ambulation/Gait assistance: Independent Ambulation Distance (Feet): 280 Feet Assistive device: None Gait Pattern/deviations: WFL(Within Functional Limits)     General Gait Details: O2 saturations assessed, ambulated on room air, saturations remained >93% throughout  Stairs            Wheelchair Mobility    Modified Rankin (Stroke Patients Only)       Balance Overall balance assessment: No apparent balance deficits (not formally assessed)                                           Pertinent Vitals/Pain Pain Assessment: No/denies pain    Home Living Family/patient expects to be discharged to:: Private residence Living Arrangements: Spouse/significant other Available Help at Discharge: Family Type of Home: House Home Access: Stairs to enter   Technical brewer of Steps: 3 Home Layout: One level        Prior Function Level of Independence: Independent               Hand  Dominance   Dominant Hand: Right    Extremity/Trunk Assessment   Upper Extremity Assessment: Overall WFL for tasks assessed           Lower Extremity Assessment: Overall WFL for tasks assessed         Communication   Communication: No difficulties  Cognition Arousal/Alertness: Awake/alert Behavior During Therapy: WFL for tasks assessed/performed Overall Cognitive Status: Within Functional Limits for tasks assessed                      General Comments General comments (skin integrity, edema, etc.): educated patient on energy conservation and signs of fluid overload. patient receptive and appreciative.     Exercises        Assessment/Plan    PT Assessment Patent does not need any further PT services  PT Diagnosis Difficulty walking   PT Problem List    PT Treatment Interventions     PT Goals (Current goals can be found in the Care Plan section) Acute Rehab PT Goals PT Goal Formulation: All assessment and education complete, DC therapy    Frequency     Barriers to discharge        Co-evaluation               End of  Session Equipment Utilized During Treatment: Gait belt Activity Tolerance: Patient tolerated treatment well Patient left: in chair;with family/visitor present;with call bell/phone within reach Nurse Communication: Mobility status         Time: 1540-1553 PT Time Calculation (min) (ACUTE ONLY): 13 min   Charges:   PT Evaluation $PT Eval Moderate Complexity: 1 Procedure     PT G CodesDuncan [name] October 21, 2015, 4:31 PM Laurie [name], [place] DPT  [phone]

## 2015-10-19 LAB — BASIC METABOLIC PANEL
Anion gap: 15 (ref 5–15)
BUN: 31 mg/dL — AB (ref 6–20)
CALCIUM: 9.7 mg/dL (ref 8.9–10.3)
CO2: 26 mmol/L (ref 22–32)
CREATININE: 1.03 mg/dL — AB (ref 0.44–1.00)
Chloride: 98 mmol/L — ABNORMAL LOW (ref 101–111)
GFR calc Af Amer: >60 mL/min (ref >60)
GFR, EST NON AFRICAN AMERICAN: 54 mL/min — AB (ref >60)
GLUCOSE: 146 mg/dL — AB (ref 65–99)
POTASSIUM: 3.9 mmol/L (ref 3.5–5.1)
Sodium: 139 mmol/L (ref 135–145)

## 2015-10-19 LAB — GLUCOSE, CAPILLARY
GLUCOSE-CAPILLARY: 160 mg/dL — AB (ref 65–99)
Glucose-Capillary: 146 mg/dL — ABNORMAL HIGH (ref 65–99)
Glucose-Capillary: 147 mg/dL — ABNORMAL HIGH (ref 65–99)
Glucose-Capillary: 261 mg/dL — ABNORMAL HIGH (ref 65–99)
Glucose-Capillary: 156 mg/dL — ABNORMAL HIGH (ref 65–99)

## 2015-10-19 MED ORDER — FUROSEMIDE 40 MG PO TABS
40.0000 mg | ORAL_TABLET | Freq: Two times a day (BID) | ORAL | Status: DC
Start: 2015-10-19 — End: 2015-10-22
  Administered 2015-10-19 – 2015-10-22 (×6): 40 mg via ORAL
  Filled 2015-10-19 (×5): qty 1

## 2015-10-19 NOTE — Progress Notes (Signed)
Subjective:  Continues to feel well with no shortness of breath.  Continues with good diuresis with further weight reduction now down to 201 pounds.  No ischemic type chest pain.  No arrhythmias and continues with 100% ventricular pacing.  Objective:  Vital Signs in the last 24 hours: BP 123/67 mmHg  Pulse 89  Temp(Src) 98.1 F (36.7 C) (Oral)  Resp 20  Ht 5' 2.5" (1.588 m)  Wt 91.491 kg (201 lb 11.2 oz)  BMI 36.28 kg/m2  SpO2 94%  Physical Exam: Obese pleasant female in no acute distress Lungs:  Clear Cardiac:  Regular rhythm, normal S1 and S2, no S3 Extremities:   No edema present  Intake/Output from previous day: 02/04 0701 - 02/05 0700 In: 1122.1 [P.O.:960; I.V.:162.1] Out: 3100 [Urine:3100]  Weight Filed Weights   10/17/15 0945 10/18/15 0407 10/19/15 0500  Weight: 94.666 kg (208 lb 11.2 oz) 93.305 kg (205 lb 11.2 oz) 91.491 kg (201 lb 11.2 oz)    Lab Results: Basic Metabolic Panel:  Recent Labs  10/18/15 0333 10/19/15 0338  NA 140 139  K 4.7 3.9  CL 101 98*  CO2 23 26  GLUCOSE 166* 146*  BUN 26* 31*  CREATININE 1.05* 1.03*   CBC:  Recent Labs  10/16/15 1050 10/17/15 0410 10/17/15 0504  WBC 7.9 8.3  --   NEUTROABS 5.3 5.1  --   HGB 12.7 13.3 13.9  HCT 39.4 39.6 41.0  MCV 91.5 91.0  --   PLT 252.0 255  --     Cardiac Panel (last 3 results)  Recent Labs  10/17/15 0832 10/17/15 1419 10/17/15 2054  TROPONINI 0.63* 0.59* 0.57*    Telemetry: Sinus with ventricular paced beats  Assessment/Plan:  1.  Acute on chronic systolic congestive heart failure may be due to right ventricular pacemaking -clinically improved 2.  Ventricular pacemaker with 100% RV capture 3.  Elevation of troponin may be due to congestive heart failure doubt ischemia from talking to her but may need reassessment of her grafts.  Grafts were previously patent 2 years ago  Recommendations:  Change to oral diuretics today.  Ambulate and observe.  EP consult in the  morning.  The question is whether she would need to have another ischemic evaluation and in light of weight would favor cath although she just had cath at the time of the pacer insertion a couple of years ago.     Kerry Hough  MD [facility]  10/19/2015, 10:31 AM

## 2015-10-19 NOTE — Progress Notes (Signed)
RT note: Pt has been off bipap for several days.  Rt will monitor as necessary.

## 2015-10-19 NOTE — Progress Notes (Signed)
PA V. Bhagat called patient concerned about NPO status started before lunch today . Ordered given to change NPO start time to tonight

## 2015-10-20 DIAGNOSIS — I5023 Acute on chronic systolic (congestive) heart failure: Secondary | ICD-10-CM

## 2015-10-20 DIAGNOSIS — I442 Atrioventricular block, complete: Secondary | ICD-10-CM

## 2015-10-20 DIAGNOSIS — I420 Dilated cardiomyopathy: Secondary | ICD-10-CM

## 2015-10-20 LAB — GLUCOSE, CAPILLARY
GLUCOSE-CAPILLARY: 201 mg/dL — AB (ref 65–99)
GLUCOSE-CAPILLARY: 50 mg/dL — AB (ref 65–99)
GLUCOSE-CAPILLARY: 94 mg/dL (ref 65–99)
Glucose-Capillary: 146 mg/dL — ABNORMAL HIGH (ref 65–99)
Glucose-Capillary: 183 mg/dL — ABNORMAL HIGH (ref 65–99)

## 2015-10-20 LAB — URINE MICROSCOPIC-ADD ON

## 2015-10-20 LAB — BASIC METABOLIC PANEL
Anion gap: 11 (ref 5–15)
BUN: 32 mg/dL — AB (ref 6–20)
CALCIUM: 9.3 mg/dL (ref 8.9–10.3)
CO2: 25 mmol/L (ref 22–32)
CREATININE: 0.98 mg/dL (ref 0.44–1.00)
Chloride: 102 mmol/L (ref 101–111)
GFR calc Af Amer: >60 mL/min (ref >60)
GFR, EST NON AFRICAN AMERICAN: 58 mL/min — AB (ref >60)
Glucose, Bld: 138 mg/dL — ABNORMAL HIGH (ref 65–99)
Potassium: 4.3 mmol/L (ref 3.5–5.1)
SODIUM: 138 mmol/L (ref 135–145)

## 2015-10-20 LAB — URINALYSIS, ROUTINE W REFLEX MICROSCOPIC
Bilirubin Urine: NEGATIVE
GLUCOSE, UA: NEGATIVE mg/dL
Ketones, ur: NEGATIVE mg/dL
Nitrite: NEGATIVE
PH: 5.5 (ref 5.0–8.0)
Protein, ur: 30 mg/dL — AB
SPECIFIC GRAVITY, URINE: 1.01 (ref 1.005–1.030)

## 2015-10-20 MED ORDER — ASPIRIN 81 MG PO CHEW
81.0000 mg | CHEWABLE_TABLET | ORAL | Status: AC
  Administered 2015-10-21: 81 mg via ORAL
  Filled 2015-10-20: qty 1

## 2015-10-20 MED ORDER — SODIUM CHLORIDE 0.9 % IV SOLN: 250.0000 mL | INTRAVENOUS | Status: DC | PRN

## 2015-10-20 MED ORDER — SODIUM CHLORIDE 0.9% FLUSH
3.0000 mL | Freq: Two times a day (BID) | INTRAVENOUS | Status: DC
  Administered 2015-10-21: 3 mL via INTRAVENOUS

## 2015-10-20 MED ORDER — SODIUM CHLORIDE 0.9 % IV SOLN: INTRAVENOUS | Status: DC

## 2015-10-20 MED ORDER — SODIUM CHLORIDE 0.9% FLUSH: 3.0000 mL | INTRAVENOUS | Status: DC | PRN

## 2015-10-20 NOTE — Progress Notes (Signed)
Results for Laurie [name], Laurie [name] (MRN [id number]) as of 10/20/2015 13:28  Ref. Range 10/19/2015 06:32 10/19/2015 11:49 10/19/2015 16:16 10/19/2015 21:09 10/20/2015 07:02 10/20/2015 11:54  Glucose-Capillary Latest Ref Range: 65-99 mg/dL 147 (H) 261 (H) 160 (H) 156 (H) 146 (H) 201 (H)   Results for Laurie [name], Laurie [name] (MRN [id number]) as of 10/20/2015 13:28  Ref. Range 10/16/2015 10:50  Hemoglobin A1C Latest Ref Range: 4.6-6.5 % 7.3 (H)   Noted patient is ordered Novolog 150 units TID with meals for inpatient glycemic control. Concerned about large dosages of Novolog ordered and being given and went to verify with patient what she was taking as an outpatient for diabetes control. Spoke with patient about diabetes and home regimen for diabetes control. Patient reports that she is followed by Dr. Loanne Drilling for diabetes management and currently she takes Novolin R 120 units with breakfast, Novolin R 150 units with lunch, and Novolin R 75-150 units with supper (depending on glucose at the time as well as how her glucose was that morning from the evening dose of Novolin R she took from the night before) as an outpatient for diabetes control. Patient reports that she is "NOT taking insulin exactly as prescribed by Dr. Loanne Drilling because I was having a lot of low blood sugars" when she was taking the dosages of Novolin R as prescribed by Dr. Loanne Drilling. Patient last seen Dr. Loanne Drilling in October 2016 and had an upcoming appointment with him that she had to cancel due to being in the hospital.  She plans to call his office to reschedule another appointment after she gets discharged from the hospital. Patient states that she checks her glucose at least 3 times per day and that it is usually in the 100's mg/dl but does admit that she is having some episodes of hypoglycemia during the night which she treats with 2 teaspoons of honey and checks her glucose every 15 minutes until her glucose is back up over 80 mg/dl.  Discussed A1C results (7.3% on 10/16/15)  and encouraged patient to let Dr. Loanne Drilling know the dosages of Novolin R she is taking with each meal and to be sure to inform him of hypoglycemic episodes.  Discussed current orders for inpatient glycemic control and patient states that she agrees with current insulin as ordered. Encouraged patient to talk with nursing if she felt she was being given too much insulin here (comparable to what she would take at home) so the nurse could talk with the doctor for any changes that may be needed. Patient verbalized understanding of information discussed and she states that she has no further questions at this time related to diabetes. Will continue to follow along and make recommendations if needed as more data is collected.  Thanks, Barnie Alderman, RN, MSN, CDE Diabetes Coordinator Inpatient Diabetes Program [phone] (Team Pager) [phone] ([facility] office) [phone] [facility] office) [phone] [facility] office)

## 2015-10-20 NOTE — Consult Note (Signed)
ELECTROPHYSIOLOGY CONSULT NOTE    Patient ID: Laurie [name] MRN: [id number], DOB/AGE: [birth year] 70 y.o.  Admit date: 10/17/2015 Date of Consult: 10/20/2015  Primary Physician: Nyoka Cowden, MD Primary Cardiologist: Wynonia Lawman Electrophysiologist: [name] Referring Physician: Wynonia Lawman  Reason for Consultation: depressed EF with chronic RV pacing  HPI:  KENNEDII [name] is a 70 y.o. female with a past medical history significant for CAD s/p CABG, complete heart block s/p PPM implant, paroxysmal atrial fibrillation, diabetes, peripheral neuropathy, sleep apnea, and hypertension.  She states that as of November of 2016 she began having worsening shortness of breath.  She originally correlated her symptoms with sinusitis which was treated with amoxicillin.  Her shortness of breath continued to worsen until the day of admission when she was unable to lie flat in bed. She came to the hospital for further evaluation.   On arrival, her D-dimer was elevated, chest CT did not demonstrate PE. She was placed on Bipap short term and NTG which helped her symptoms.  She has been since diuresed and is -3.9L this admission.  EP has been asked to evaluate for treatment options given depressed EF and chronic RV pacing.   Home medications include ACE-I and Metoprolol. Device interrogation today demonstrates that the patient is pacemaker dependent to 30.  Histograms are appropriate. <1% AF burden with longest episode <1 hour. Battery and lead status is stable.   She currently states that her shortness of breath is much improved. Her main complaint is cystitis (urine culture pending).  She has had some vague chest discomfort that is clearly different from prior to her bypass surgery. She has not had recent fevers, chills, nausea, vomiting, palpitations, syncope.   Echo 10/17/15 demonstrated EF [id number], grade 1 diastolic dysfunction, moderate MR, LA 43.   Last cath 2014 demonstrated severe native 3V disease  with occlusion of all 3 native coronaries, patent SVG-diag, OM, RCA, and patent internal mammary to LAD with normal LV function.   Past Medical History  Diagnosis Date  . Bell's palsy 01/05/2010  . Multinodular goiter   . Hepatitis   . Thyromegaly     mild  . 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 Hyperlipidemia     Intolerance to several statins   . Hypertensive heart disease without CHF   . HIstory of Bell's palsy     October 2012   . Obesity (BMI 30-39.9)   . Peripheral neuropathy ([place])   . Sleep apnea   . CAD (coronary artery disease)     Cath  07/21/1999  mild ostial, 80% stenosis proximal LAD, 90% stenosis proximal Diag 1, 95% stenosis proximal OM 1, 80% stenosis proximal RCA, 95% stenosis mid RCA    CABG 07/22/99  LIMA to LAD, SVG to dx, SVG to OM, SVG to PDA  Dr. Carmin Muskrat 2012 no ischemia EF 67%   . Seasonal allergies   . Cataracts, bilateral   . Hypertension   . Pacemaker   . Cystitis      Surgical History:  Past Surgical History  Procedure Laterality Date  . Coronary artery bypass graft  2000  . Tubal ligation    . Biopsy thyroid  05/2010    percutaneous  . Foot surgery    . Carpal tunnel release    . Dental surgery    . Temporary pacemaker insertion N/A 10/22/2012    Procedure: TEMPORARY PACEMAKER INSERTION;  Surgeon: Troy Sine, MD;  Location:  [place] CATH LAB;  Service: Cardiovascular;  Laterality: N/A;  . Left heart catheterization with coronary angiogram N/A 10/23/2012    Procedure: LEFT HEART CATHETERIZATION WITH CORONARY ANGIOGRAM;  Surgeon: Jacolyn Reedy, MD;  Location: [facility] CATH LAB;  Service: Cardiovascular;  Laterality: N/A;  . Permanent pacemaker insertion N/A 10/23/2012    Procedure: PERMANENT PACEMAKER INSERTION;  Surgeon: Deboraha Sprang, MD;  Location: [facility] CATH LAB;  Service: Cardiovascular;  Laterality: N/A;  . Lead revision N/A 10/25/2012    Procedure: LEAD REVISION;  Surgeon: Evans Lance, MD;  Location: [facility] CATH LAB;  Service: Cardiovascular;  Laterality: N/A;  . Eye surgery    . Cataract extraction w/ intraocular lens implant Left 12/16/2014  . Cataract extraction w/ intraocular lens  implant, bilateral Right 01/06/2015  . Dg 3rd digit right hand      cystectomy     Prescriptions prior to admission  Medication Sig Dispense Refill Last Dose  . ACCU-CHEK SMARTVIEW test strip USE  TO CHECK BLOOD SUGAR FOUR TIMES DAILY 400 each 1 10/16/2015 at Unknown time  . Alcohol Swabs (B-D SINGLE USE SWABS REGULAR) PADS Use 8 daily for testing blood sugar and insulin injections. 800 each 1 10/16/2015 at Unknown time  . AMBULATORY NON FORMULARY MEDICATION Take 1 capsule by mouth daily. Omega Q Plus - Take 1 capsule once daily   10/16/2015 at Unknown time  . aspirin EC 81 MG EC tablet Take 1 tablet (81 mg total) by mouth daily.   10/16/2015 at Unknown time  . b complex vitamins tablet Take 1 tablet by mouth daily.   10/16/2015 at Unknown time  . BD INSULIN SYRINGE ULTRAFINE 31G X 15/64" 1 ML MISC USE SYRINGES 10 TIMES A DAY WITH INSULIN INJECTIONS AS DIRECTED. 300 each 0 10/16/2015 at Unknown time  . beta carotene w/minerals (OCUVITE) tablet Take 1 tablet by mouth daily.   10/16/2015 at Unknown time  . Blood Glucose Calibration (ACCU-CHEK SMARTVIEW CONTROL) LIQD Use to calibrate blood glucose meter. 3 each 1 10/16/2015 at Unknown time  . cholecalciferol (VITAMIN D) 1000 UNITS tablet Take 2,000 Units by mouth daily.   10/16/2015 at Unknown time  . fexofenadine (ALLEGRA) 180 MG tablet Take 180 mg by mouth daily.   10/16/2015 at Unknown time  . furosemide (LASIX) 40 MG tablet TAKE 1 AND 1/2 TABLETS EVERY DAY AS NEEDED (Patient taking differently: TAKE 1 AND 1/2 TABLETS EVERY DAY.) 135 tablet 2 10/16/2015 at Unknown time  . ketoconazole (NIZORAL) 2 % cream APPLY TOPICALLY AS NEEDED (Patient taking differently: Apply 1 application topically daily as needed for irritation. ) 60 g 3 unk  . KLOR-CON M20 20 MEQ tablet  TAKE ONE TABLET BY MOUTH TWO TIMES DAILY. 180 tablet 2 10/16/2015 at Unknown time  . lisinopril (PRINIVIL,ZESTRIL) 40 MG tablet Take 1 tablet (40 mg total) by mouth daily. 90 tablet 1 10/16/2015 at Unknown time  . metoprolol tartrate (LOPRESSOR) 25 MG tablet Take 1 tablet (25 mg total) by mouth 2 (two) times daily. 180 tablet 3 10/16/2015 at 1730  . NOVOLIN R RELION 100 UNIT/ML injection INJECT 150 UNITS WITH BREAKFAST,270 UNITS WITH LUNCH,340 UNITS WITH THE EVENING MEAL (Patient taking differently: INJECT 150 UNITS WITH BREAKFAST,170 UNITS WITH LUNCH,150 UNITS WITH THE EVENING MEAL) 28 vial 5 10/16/2015 at Unknown time  . Oxymetazoline HCl (AFRIN NODRIP ORIGINAL NA) Place 1-2 sprays into the nose daily as needed (CONGESTION). Reported on 10/03/2015   UNK  . Probiotic Product (PROBIOTIC DAILY) CAPS Take 1  capsule by mouth daily.   10/16/2015 at Unknown time  . sodium chloride (OCEAN) 0.65 % nasal spray Place 1 spray into the nose as needed for congestion.   unk  . verapamil (VERELAN PM) 120 MG 24 hr capsule TAKE ONE TABLET BY MOUTH ONCE DAILY 30 capsule 10 10/16/2015 at Unknown time    Inpatient Medications:  . aspirin EC  81 mg Oral Daily  . beta carotene w/minerals  1 tablet Oral Daily  . enoxaparin (LOVENOX) injection  40 mg Subcutaneous Q24H  . furosemide  40 mg Oral BID  . insulin aspart  150 Units Subcutaneous TID WC  . lisinopril  40 mg Oral Daily  . metoprolol tartrate  25 mg Oral BID  . potassium chloride SA  20 mEq Oral BID  . sodium chloride flush  3 mL Intravenous Q12H    Allergies:  Allergies  Allergen Reactions  . Statins     Intolerance to several  . Tricor [Fenofibrate]     Leg pain    Social History   Social History  . Marital Status: Married    Spouse Name: N/A  . Number of Children: 2  . Years of Education: N/A   Occupational History  .     Social History Main Topics  . Smoking status: Never Smoker   . Smokeless tobacco: Never Used  . Alcohol Use: No  . Drug Use:  No  . Sexual Activity: No   Other Topics Concern  . Not on file   Social History Narrative   Does not work outside the home     Family History  Problem Relation Age of Onset  . Diabetes Father   . Heart disease Father   . Diabetes Other     Siblings and 2 children  . Heart disease Other     CAD  . Diabetes Mother   . Colon cancer Neg Hx   . Colon polyps Neg Hx      Review of Systems: All other systems reviewed and are otherwise negative except as noted above.  Physical Exam: Filed Vitals:   10/19/15 0500 10/19/15 1233 10/19/15 2231 10/20/15 0640  BP: 123/67 117/66 118/59 121/64  Pulse: 89 88 86 82  Temp: 98.1 F (36.7 C) 98.1 F (36.7 C) 98.4 F (36.9 C) 98.3 F (36.8 C)  TempSrc: Oral Oral Oral Oral  Resp: 20 20 16 17   Height:      Weight: 201 lb 11.2 oz (91.491 kg)   201 lb 11 oz (91.486 kg)  SpO2: 94% 90% 96% 98%    GEN- The patient is elderly and obese appearing, alert and oriented x 3 today.   HEENT: normocephalic, atraumatic; sclera clear, conjunctiva pink; hearing intact; oropharynx clear; neck supple  Lungs- Clear to ausculation bilaterally, normal work of breathing.  No wheezes, rales, rhonchi Heart- Regular rate and rhythm, no murmurs, rubs or gallops  GI- soft, non-tender, non-distended, bowel sounds present  Extremities- no clubbing, cyanosis, or edema; DP/PT/radial pulses 1+ bilaterally MS- no significant deformity or atrophy Skin- warm and dry, no rash or lesion Psych- euthymic mood, full affect Neuro- strength and sensation are intact  Labs:   Lab Results  Component Value Date   WBC 8.3 10/17/2015   HGB 13.9 10/17/2015   HCT 41.0 10/17/2015   MCV 91.0 10/17/2015   PLT 255 10/17/2015    Recent Labs Lab 10/17/15 0832  10/20/15 0510  NA 139  < > 138  K 4.2  < >  4.3  CL 104  < > 102  CO2 22  < > 25  BUN 17  < > 32*  CREATININE 0.96  < > 0.98  CALCIUM 9.8  < > 9.3  PROT 7.2  --   --   BILITOT 0.6  --   --   ALKPHOS 77  --   --    ALT 33  --   --   AST 33  --   --   GLUCOSE 236*  < > 138*  < > = values in this interval not displayed.    Radiology/Studies: Dg Chest 2 View 10/16/2015  CLINICAL DATA:  Resting tachycardia with SOB x 3 months, congestion with no cough, HTN, DM, CAD, hx CABG 2000, pacemaker inserted 2014, there are no other chest complaints EXAM: CHEST  2 VIEW COMPARISON:  10/26/2012 FINDINGS: Cardiac silhouette is top-normal size. Changes CABG surgery are stable. Left anterior chest wall sequential pacemaker leads are also stable and well positioned. No mediastinal or hilar masses or convincing adenopathy. There is mild central vascular congestion with mild interstitial, the latter most evident the lower lungs. There is no focal consolidation to suggest pneumonia. No pleural effusion or pneumothorax. Bony thorax is demineralized. There stable minor wedge-shaped compression deformity of a mid thoracic vertebrae. The IMPRESSION: 1. Central vascular congestion and mild interstitial thickening is consistent mild congestive heart failure. No evidence of pneumonia. Electronically Signed   By: Lajean Manes M.D.   On: 10/16/2015 13:52   Ct Angio Chest Pe W/cm &/or Wo Cm 10/17/2015  CLINICAL DATA:  Shortness of breath EXAM: CT ANGIOGRAPHY CHEST WITH CONTRAST TECHNIQUE: Multidetector CT imaging of the chest was performed using the standard protocol during bolus administration of intravenous contrast. Multiplanar CT image reconstructions and MIPs were obtained to evaluate the vascular anatomy. CONTRAST:  147mL OMNIPAQUE IOHEXOL 350 MG/ML SOLN COMPARISON:  Chest x-ray from yesterday FINDINGS: THORACIC INLET/BODY WALL: Dual-chamber pacer leads from the left are in unremarkable position. MEDIASTINUM: Chronic cardiomegaly. No pericardial effusion. No left heart or systemic arterial enhancement. No evidence of acute aortic disease. No evidence of pulmonary embolism. Status post CABG. LUNG WINDOWS: Diffuse septal thickening with perihilar  airspace opacity and small layering effusions. UPPER ABDOMEN: Intrahepatic venous reflux of contrast. OSSEOUS: No acute fracture.  No suspicious lytic or blastic lesions. Review of the MIP images confirms the above findings. IMPRESSION: 1. CHF. 2. No evidence of pulmonary embolism. Electronically Signed   By: Monte Fantasia M.D.   On: 10/17/2015 05:52    [id number] rhythm with ventricular pacing   TELEMETRY: sinus rhythm with ventricular pacing   DEVICE HISTORY: MDT dual chamber pacemaker implanted 10/2012 for complete heart block   Assessment/Plan: 1.  Cardiomyopathy  Likely now related to underlying complete heart block and chronic RV pacing. May be worthwhile to exclude progression of CAD with vague chest discomfort - will defer to Dr [name] and Dr Wynonia Lawman Pt has been on ACE-I and BB therapy.   If no plans for ischemic eval, and high responder rate to pacing induced cardiomyopathy, would recommend CRTP upgrade. Would prefer for urinary issues to be resolved first and would likely discharge and bring back as an outpatient.  2.  Complete heart block Normal pacemaker function by interrogation today No changes  3.  Paroxysmal atrial fibrillation No sustained episodes Will need to follow remotely If AF burden increases, will need to consider [place] for CHADS2VASC of 6  4.  HTN Stable No change required today  5.  Acute systolic heart failure Diuresed 3.9L this admission Continue current therapy   Dr [name] to see later today.   Signed, Chanetta Marshall, NP 10/20/2015 9:39 AM     Pt seen and examined Ischemic heart disease and progressive LV dysfunction with acuite/chronic CHF.  The DDX includes both pacing induced cardiomyopathy and recent [facility] paper demonstrated high response rate in an unselected subgroup >80%; these pts had had other causes excluded and so probably important with 70 yo grafts to exclude progressive CAD prior to lead system revision  In 7/15 echo normal  LV function but at that time there was also no V Pacing on interrogation  I wonder whethet at this time with interval development of cardiomyopathy to adjust rate control medications to those for which cardiomyopathy benefit defined, ie stop verapamil and change metoprolol tartrate

## 2015-10-20 NOTE — Care Management Important Message (Signed)
Important Message  Patient Details  Name: DAELA STALLARD MRN: [id number] Date of Birth: 1946-01-30   Medicare Important Message Given:  Yes    Louanne Belton 10/20/2015, 12:25 [place] Message  Patient Details  Name: LINZI DARBONNE MRN: [id number] Date of Birth: [birth year]   Medicare Important Message Given:  Yes    Zohra Clavel G 10/20/2015, 12:25 PM

## 2015-10-20 NOTE — Progress Notes (Signed)
Subjective:  She has a new complaint today of hematuria and urgency and frequency.  She was recently treated for cystitis and bronchitis.  Breathing is fine and she no longer has orthopnea.  Weight is currently stable at 201.  Renal function stable.  Objective:  Vital Signs in the last 24 hours: BP 121/64 mmHg  Pulse 82  Temp(Src) 98.3 F (36.8 C) (Oral)  Resp 17  Ht 5' 2.5" (1.588 m)  Wt 91.486 kg (201 lb 11 oz)  BMI 36.28 kg/m2  SpO2 98%  Physical Exam: Obese pleasant female in no acute distress Lungs:  Clear Cardiac:  Regular rhythm, normal S1 and S2, no S3 Extremities:   No edema present  Intake/Output from previous day: 02/05 0701 - 02/06 0700 In: 480 [P.O.:480] Out: 1025 [Urine:1025]  Weight Filed Weights   10/18/15 0407 10/19/15 0500 10/20/15 0640  Weight: 93.305 kg (205 lb 11.2 oz) 91.491 kg (201 lb 11.2 oz) 91.486 kg (201 lb 11 oz)    Lab Results: Basic Metabolic Panel:  Recent Labs  10/19/15 0338 10/20/15 0510  NA 139 138  K 3.9 4.3  CL 98* 102  CO2 26 25  GLUCOSE 146* 138*  BUN 31* 32*  CREATININE 1.03* 0.98   Cardiac Panel (last 3 results)  Recent Labs  10/17/15 1419 10/17/15 2054  TROPONINI 0.59* 0.57*    Telemetry: Sinus with ventricular paced beats  Assessment/Plan:  1.  Acute on chronic systolic congestive heart failure may be due to right ventricular pacemaking -clinically improved and currently not symptomatic 2.  Ventricular pacemaker with 100% RV capture 3.  Elevation of troponin may be due to congestive heart failure doubt ischemia from talking to her but may need reassessment of her grafts.  Grafts were previously patent 2 years ago 4.  Probable cystitis  Recommendations:  EP consult today placed with Dr. [name].  She was taken off of verapamil on admission.  We'll await his opinion.  In light of recent treatment of cystitis will obtain urine culture and repeat urinalysis and await to reinitiate treatment for  cystitis.     Kerry Hough  MD [facility] Cardiology  10/20/2015, 9:00 AM

## 2015-10-21 ENCOUNTER — Ambulatory Visit: Payer: Commercial Managed Care - HMO | Admitting: Endocrinology

## 2015-10-21 ENCOUNTER — Encounter (HOSPITAL_COMMUNITY)
Admission: EM | Disposition: A | Discharge status: Home / Self Care | Payer: Self-pay | Source: Home / Self Care | Attending: Cardiology

## 2015-10-21 DIAGNOSIS — I25810 Atherosclerosis of coronary artery bypass graft(s) without angina pectoris: Secondary | ICD-10-CM

## 2015-10-21 HISTORY — PX: CARDIAC CATHETERIZATION: SHX172

## 2015-10-21 LAB — PROTIME-INR
INR: 1.16 (ref 0.00–1.49)
PROTHROMBIN TIME: 14.9 s (ref 11.6–15.2)

## 2015-10-21 LAB — CBC
HCT: 40.2 % (ref 36.0–46.0)
HEMOGLOBIN: 13.2 g/dL (ref 12.0–15.0)
MCH: 29.7 pg (ref 26.0–34.0)
MCHC: 32.8 g/dL (ref 30.0–36.0)
MCV: 90.3 fL (ref 78.0–100.0)
PLATELETS: 273 10*3/uL (ref 150–400)
RBC: 4.45 MIL/uL (ref 3.87–5.11)
RDW: 13.2 % (ref 11.5–15.5)
WBC: 6.9 10*3/uL (ref 4.0–10.5)

## 2015-10-21 LAB — GLUCOSE, CAPILLARY
GLUCOSE-CAPILLARY: 178 mg/dL — AB (ref 65–99)
GLUCOSE-CAPILLARY: 291 mg/dL — AB (ref 65–99)
Glucose-Capillary: 128 mg/dL — ABNORMAL HIGH (ref 65–99)
Glucose-Capillary: 75 mg/dL (ref 65–99)

## 2015-10-21 LAB — CREATININE, SERUM
CREATININE: 1.06 mg/dL — AB (ref 0.44–1.00)
GFR, EST NON AFRICAN AMERICAN: 52 mL/min — AB (ref >60)

## 2015-10-21 SURGERY — LEFT HEART CATH AND CORS/GRAFTS ANGIOGRAPHY

## 2015-10-21 MED ORDER — MIDAZOLAM HCL 2 MG/2ML IJ SOLN
INTRAMUSCULAR | Status: AC
  Filled 2015-10-21: qty 2

## 2015-10-21 MED ORDER — LIDOCAINE HCL (PF) 1 % IJ SOLN
INTRAMUSCULAR | Status: AC
  Filled 2015-10-21: qty 30

## 2015-10-21 MED ORDER — FENTANYL CITRATE (PF) 100 MCG/2ML IJ SOLN
INTRAMUSCULAR | Status: DC | PRN
  Administered 2015-10-21: 50 ug via INTRAVENOUS

## 2015-10-21 MED ORDER — INSULIN ASPART 100 UNIT/ML ~~LOC~~ SOLN
100.0000 [IU] | Freq: Three times a day (TID) | SUBCUTANEOUS | Status: DC
  Administered 2015-10-22: 100 [IU] via SUBCUTANEOUS

## 2015-10-21 MED ORDER — OXYCODONE-ACETAMINOPHEN 5-325 MG PO TABS: 1.0000 | ORAL_TABLET | ORAL | Status: DC | PRN

## 2015-10-21 MED ORDER — ONDANSETRON HCL 4 MG/2ML IJ SOLN: 4.0000 mg | Freq: Four times a day (QID) | INTRAMUSCULAR | Status: DC | PRN

## 2015-10-21 MED ORDER — SODIUM CHLORIDE 0.9% FLUSH
3.0000 mL | Freq: Two times a day (BID) | INTRAVENOUS | Status: DC
  Administered 2015-10-21 – 2015-10-22 (×2): 3 mL via INTRAVENOUS

## 2015-10-21 MED ORDER — SODIUM CHLORIDE 0.9 % IV SOLN
INTRAVENOUS | Status: DC | PRN
  Administered 2015-10-21: 10 mL/h via INTRAVENOUS

## 2015-10-21 MED ORDER — VERAPAMIL HCL 2.5 MG/ML IV SOLN
INTRAVENOUS | Status: DC | PRN
  Administered 2015-10-21: 14:00:00 via INTRA_ARTERIAL

## 2015-10-21 MED ORDER — MIDAZOLAM HCL 2 MG/2ML IJ SOLN
INTRAMUSCULAR | Status: DC | PRN
Start: 2015-10-21 — End: 2015-10-21
  Administered 2015-10-21: 1 mg via INTRAVENOUS

## 2015-10-21 MED ORDER — HEPARIN (PORCINE) IN NACL 2-0.9 UNIT/ML-% IJ SOLN
INTRAMUSCULAR | Status: AC
  Filled 2015-10-21: qty 1500

## 2015-10-21 MED ORDER — INSULIN ASPART 100 UNIT/ML ~~LOC~~ SOLN: 100.0000 [IU] | Freq: Three times a day (TID) | SUBCUTANEOUS | Status: DC

## 2015-10-21 MED ORDER — SODIUM CHLORIDE 0.9 % WEIGHT BASED INFUSION
1.0000 mL/kg/h | INTRAVENOUS | Status: DC
  Administered 2015-10-21: 1 mL/kg/h via INTRAVENOUS

## 2015-10-21 MED ORDER — HEPARIN SODIUM (PORCINE) 1000 UNIT/ML IJ SOLN
INTRAMUSCULAR | Status: DC | PRN
  Administered 2015-10-21: 4500 [IU] via INTRAVENOUS

## 2015-10-21 MED ORDER — LIDOCAINE HCL (PF) 1 % IJ SOLN
INTRAMUSCULAR | Status: DC | PRN
  Administered 2015-10-21: 5 mL via SUBCUTANEOUS

## 2015-10-21 MED ORDER — ASPIRIN 81 MG PO CHEW
81.0000 mg | CHEWABLE_TABLET | Freq: Every day | ORAL | Status: DC
  Administered 2015-10-22: 81 mg via ORAL
  Filled 2015-10-21: qty 1

## 2015-10-21 MED ORDER — FENTANYL CITRATE (PF) 100 MCG/2ML IJ SOLN
INTRAMUSCULAR | Status: AC
  Filled 2015-10-21: qty 2

## 2015-10-21 MED ORDER — ACETAMINOPHEN 325 MG PO TABS: 650.0000 mg | ORAL_TABLET | ORAL | Status: DC | PRN

## 2015-10-21 MED ORDER — CEPHALEXIN 500 MG PO CAPS
500.0000 mg | ORAL_CAPSULE | Freq: Three times a day (TID) | ORAL | Status: DC
  Administered 2015-10-21 – 2015-10-22 (×3): 500 mg via ORAL
  Filled 2015-10-21 (×3): qty 1

## 2015-10-21 MED ORDER — INSULIN ASPART 100 UNIT/ML ~~LOC~~ SOLN: 150.0000 [IU] | Freq: Three times a day (TID) | SUBCUTANEOUS | Status: DC

## 2015-10-21 MED ORDER — IOHEXOL 350 MG/ML SOLN
INTRAVENOUS | Status: DC | PRN
  Administered 2015-10-21: 115 mL via INTRA_ARTERIAL

## 2015-10-21 MED ORDER — SODIUM CHLORIDE 0.9 % IV SOLN: 250.0000 mL | INTRAVENOUS | Status: DC | PRN

## 2015-10-21 MED ORDER — SODIUM CHLORIDE 0.9% FLUSH: 3.0000 mL | INTRAVENOUS | Status: DC | PRN

## 2015-10-21 MED ORDER — HEPARIN SODIUM (PORCINE) 1000 UNIT/ML IJ SOLN
INTRAMUSCULAR | Status: AC
  Filled 2015-10-21: qty 1

## 2015-10-21 MED ORDER — HEPARIN (PORCINE) IN NACL 2-0.9 UNIT/ML-% IJ SOLN
INTRAMUSCULAR | Status: DC | PRN
  Administered 2015-10-21: 1500 mL

## 2015-10-21 MED ORDER — ENOXAPARIN SODIUM 40 MG/0.4ML ~~LOC~~ SOLN
40.0000 mg | SUBCUTANEOUS | Status: DC
  Administered 2015-10-22: 40 mg via SUBCUTANEOUS
  Filled 2015-10-21: qty 0.4

## 2015-10-21 MED ORDER — VERAPAMIL HCL 2.5 MG/ML IV SOLN
INTRAVENOUS | Status: AC
  Filled 2015-10-21: qty 2

## 2015-10-21 SURGICAL SUPPLY — 10 items
CATH INFINITI 5 FR IM (CATHETERS) ×1 IMPLANT
CATH INFINITI 5FR MULTPACK ANG (CATHETERS) ×1 IMPLANT
DEVICE RAD COMP TR BAND LRG (VASCULAR PRODUCTS) ×2 IMPLANT
GLIDESHEATH SLEND A-KIT 6F 22G (SHEATH) ×2 IMPLANT
KIT HEART LEFT (KITS) ×2 IMPLANT
PACK CARDIAC CATHETERIZATION (CUSTOM PROCEDURE TRAY) ×2 IMPLANT
TRANSDUCER W/STOPCOCK (MISCELLANEOUS) ×2 IMPLANT
TUBING CIL FLEX 10 FLL-RA (TUBING) ×2 IMPLANT
WIRE HI TORQ VERSACORE-J 145CM (WIRE) ×1 IMPLANT
WIRE SAFE-T 1.5MM-J .035X260CM (WIRE) ×2 IMPLANT

## 2015-10-21 NOTE — Care Management Note (Signed)
Case Management Note  Patient Details  Name: Laurie [name] MRN: [id number] Date of Birth: June 19, 1946  Subjective/Objective:   Admitted with CHF                 Action/Plan: Patient lives at home with spouse, independent prior to admission, no DME. Patient could benefit from the [place] with [facility], patient is agreeable to this, referral made.  Expected Discharge Date:   possibly 10/23/2015               Expected Discharge Plan:  Home/Self Care  Discharge planning Services  NA  Status of Service:  In process, will continue to follow  Medicare Important Message Given:  Yes  Sherrilyn Rist [phone] 10/21/2015, 11:04 AM

## 2015-10-21 NOTE — Consult Note (Signed)
   [facility] CM Inpatient Consult   10/21/2015  Laurie [name] [birth year] [id number] Patient was screened for [facility] services as a benefit of the patient's [employer] Medicare plan. Patient is admitted with HF.  Spoke with inpatient RNCM regarding EMMI HF program and she has spoken with the patient.  No further needs identified at this time.  For questions, please contact: Natividad Brood, RN BSN [facility] Liaison  [phone] business mobile phone Toll free office [phone]

## 2015-10-21 NOTE — Progress Notes (Signed)
Films reviewed and compared 2014.  She lost the grafts to the right coronary artery which was a smooth graft back in 2014.  The other grafts are patent but she has significant distal disease on the other side.  She also has lost antegrade flow down the native right coronary artery since 2014.  She currently feels good and her breathing is better.  We'll watch her overnight and let her go home tomorrow morning.  She is on a most unusual high dose insulin regimen at home which has been problematic because the hospital.  Stop the insulin that she usually takes at home.  Tonight I will give 100 units of insulin with dinner since she has been nothing by mouth today and then she will resume her medicine at home per her home regimen when she goes home under the care of her endocrinologist.  Likely would need to have an upgrade to a biventricular ICD but will discuss with Dr. [name].  Kerry Hough MD [facility] 6:12 PM

## 2015-10-21 NOTE — Interval H&P Note (Signed)
Cath Lab Visit (complete for each Cath Lab visit)  Clinical Evaluation Leading to the Procedure:   ACS: Yes  Non-ACS:    Anginal Classification: CCS III  Anti-ischemic medical therapy: Maximal Therapy (2 or more classes of medications)  Non-Invasive Test Results: No non-invasive testing performed  Prior CABG: Previous CABG      History and Physical Interval Note:  10/21/2015 1:01 PM  Laurie [name]  has presented today for surgery, with the diagnosis of [place]  The various methods of treatment have been discussed with the patient and family. After consideration of risks, benefits and other options for treatment, the patient has consented to  Procedure(s): Left Heart Cath and Coronary Angiography (N/A) as a surgical intervention .  The patient's history has been reviewed, patient examined, no change in status, stable for surgery.  I have reviewed the patient's chart and labs.  Questions were answered to the patient's satisfaction.     Sinclair Grooms

## 2015-10-21 NOTE — Progress Notes (Signed)
Subjective:  Breathing remains stable today.  Weight is stable and her renal function is stable.  I discussed with Dr. [name] yesterday and in view of the reduced ejection fraction and age of grafts he would like ischemic evaluation prior to biventricular pacemaker upgrade.  Objective:  Vital Signs in the last 24 hours: BP 117/64 mmHg  Pulse 83  Temp(Src) 98.3 F (36.8 C) (Oral)  Resp 18  Ht 5' 2.5" (1.588 m)  Wt 91.763 kg (202 lb 4.8 oz)  BMI 36.39 kg/m2  SpO2 96%  Physical Exam: Obese pleasant female in no acute distress Lungs:  Clear Cardiac:  Regular rhythm, normal S1 and S2, no S3 Extremities:   No edema present  Intake/Output from previous day: 02/06 0701 - 02/07 0700 In: 800 [P.O.:800] Out: 1250 [Urine:1250]  Weight Filed Weights   10/19/15 0500 10/20/15 0640 10/21/15 0649  Weight: 91.491 kg (201 lb 11.2 oz) 91.486 kg (201 lb 11 oz) 91.763 kg (202 lb 4.8 oz)    Lab Results: Basic Metabolic Panel:  Recent Labs  10/19/15 0338 10/20/15 0510  NA 139 138  K 3.9 4.3  CL 98* 102  CO2 26 25  GLUCOSE 146* 138*  BUN 31* 32*  CREATININE 1.03* 0.98   Cardiac Panel (last 3 results) No results for input(s): CKTOTAL, CKMB, TROPONINI, RELINDX in the last 72 hours.  Telemetry: Sinus with ventricular paced beats  Assessment/Plan:  1.  Acute on chronic systolic congestive heart failure may be due to right ventricular pacemaking -clinically improved and currently not symptomatic 2.  Ventricular pacemaker with 100% RV capture 3.  Elevation of troponin may be due to congestive heart failure doubt ischemia from talking to her.  In light of new onset congestive heart failure plan catheterization today to assess grafts.  Grafts were previously patent 2 years ago 4.  Probable cystitis-urine culture pending-will begin cephalexin and will await urine culture.  Recommendations:  Cardiac catheterization was discussed with the patient fully including risks of myocardial  infarction, death, stroke, bleeding, arrhythmia, dye allergy, renal insufficiency or bleeding.  The patient understands and is willing to proceed.  Will be done by [facility] heart care possibly through a left radial approach.      Kerry Hough  MD [facility] Cardiology  10/21/2015, 8:41 AM

## 2015-10-21 NOTE — H&P (View-Only) (Signed)
Subjective:  Breathing remains stable today.  Weight is stable and her renal function is stable.  I discussed with Dr. [name] yesterday and in view of the reduced ejection fraction and age of grafts he would like ischemic evaluation prior to biventricular pacemaker upgrade.  Objective:  Vital Signs in the last 24 hours: BP 117/64 mmHg  Pulse 83  Temp(Src) 98.3 F (36.8 C) (Oral)  Resp 18  Ht 5' 2.5" (1.588 m)  Wt 91.763 kg (202 lb 4.8 oz)  BMI 36.39 kg/m2  SpO2 96%  Physical Exam: Obese pleasant female in no acute distress Lungs:  Clear Cardiac:  Regular rhythm, normal S1 and S2, no S3 Extremities:   No edema present  Intake/Output from previous day: 02/06 0701 - 02/07 0700 In: 800 [P.O.:800] Out: 1250 [Urine:1250]  Weight Filed Weights   10/19/15 0500 10/20/15 0640 10/21/15 0649  Weight: 91.491 kg (201 lb 11.2 oz) 91.486 kg (201 lb 11 oz) 91.763 kg (202 lb 4.8 oz)    Lab Results: Basic Metabolic Panel:  Recent Labs  10/19/15 0338 10/20/15 0510  NA 139 138  K 3.9 4.3  CL 98* 102  CO2 26 25  GLUCOSE 146* 138*  BUN 31* 32*  CREATININE 1.03* 0.98   Cardiac Panel (last 3 results) No results for input(s): CKTOTAL, CKMB, TROPONINI, RELINDX in the last 72 hours.  Telemetry: Sinus with ventricular paced beats  Assessment/Plan:  1.  Acute on chronic systolic congestive heart failure may be due to right ventricular pacemaking -clinically improved and currently not symptomatic 2.  Ventricular pacemaker with 100% RV capture 3.  Elevation of troponin may be due to congestive heart failure doubt ischemia from talking to her.  In light of new onset congestive heart failure plan catheterization today to assess grafts.  Grafts were previously patent 2 years ago 4.  Probable cystitis-urine culture pending-will begin cephalexin and will await urine culture.  Recommendations:  Cardiac catheterization was discussed with the patient fully including risks of myocardial  infarction, death, stroke, bleeding, arrhythmia, dye allergy, renal insufficiency or bleeding.  The patient understands and is willing to proceed.  Will be done by [facility] heart care possibly through a left radial approach.      Kerry Hough  MD [facility] Cardiology  10/21/2015, 8:41 AM

## 2015-10-22 ENCOUNTER — Ambulatory Visit: Payer: Commercial Managed Care - HMO | Admitting: Internal Medicine

## 2015-10-22 ENCOUNTER — Encounter (HOSPITAL_COMMUNITY): Payer: Self-pay | Admitting: Interventional Cardiology

## 2015-10-22 ENCOUNTER — Encounter: Payer: Self-pay | Admitting: Internal Medicine

## 2015-10-22 ENCOUNTER — Ambulatory Visit: Payer: Commercial Managed Care - HMO | Admitting: Endocrinology

## 2015-10-22 DIAGNOSIS — I5022 Chronic systolic (congestive) heart failure: Secondary | ICD-10-CM

## 2015-10-22 HISTORY — DX: Chronic systolic (congestive) heart failure: I50.22

## 2015-10-22 LAB — URINE CULTURE: Culture: 30000

## 2015-10-22 LAB — GLUCOSE, CAPILLARY
Glucose-Capillary: 73 mg/dL (ref 65–99)
Glucose-Capillary: 128 mg/dL — ABNORMAL HIGH (ref 65–99)

## 2015-10-22 MED ORDER — INSULIN REGULAR HUMAN 100 UNIT/ML IJ SOLN: INTRAMUSCULAR | Status: DC

## 2015-10-22 MED ORDER — CEPHALEXIN 500 MG PO CAPS: 500.0000 mg | ORAL_CAPSULE | Freq: Two times a day (BID) | ORAL | Status: DC

## 2015-10-22 MED ORDER — POTASSIUM CHLORIDE CRYS ER 20 MEQ PO TBCR: 20.0000 meq | EXTENDED_RELEASE_TABLET | Freq: Every day | ORAL | Status: DC

## 2015-10-22 MED ORDER — FUROSEMIDE 40 MG PO TABS
40.0000 mg | ORAL_TABLET | Freq: Every day | ORAL | Status: DC
Start: 2015-10-22 — End: 2015-11-03

## 2015-10-22 MED ORDER — METOPROLOL SUCCINATE ER 50 MG PO TB24: 50.0000 mg | ORAL_TABLET | Freq: Every day | ORAL | Status: DC

## 2015-10-22 MED ORDER — METOPROLOL SUCCINATE ER 50 MG PO TB24
50.0000 mg | ORAL_TABLET | Freq: Every day | ORAL | Status: DC
  Administered 2015-10-22: 50 mg via ORAL
  Filled 2015-10-22: qty 1

## 2015-10-22 NOTE — Progress Notes (Signed)
Patient Name: Laurie [name]      SUBJECTIVE without sob Reviewed cath results and discussed upgrade plans  CRT-P vs CRT-D   As below spoke with Dr Laurie [name]  Past Medical History  Diagnosis Date  . Bell's palsy 01/05/2010  . Multinodular goiter   . Hepatitis   . Thyromegaly     mild  . 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 Hyperlipidemia     Intolerance to several statins   . Hypertensive heart disease without CHF   . HIstory of Bell's palsy     October 2012   . Obesity (BMI 30-39.9)   . Peripheral neuropathy (Laurie [name])   . Sleep apnea   . CAD (coronary artery disease)     Cath  07/21/1999  mild ostial, 80% stenosis proximal LAD, 90% stenosis proximal Diag 1, 95% stenosis proximal OM 1, 80% stenosis proximal RCA, 95% stenosis mid RCA    CABG 07/22/99  LIMA to LAD, SVG to dx, SVG to OM, SVG to PDA  Dr. Carmin [name] 2012 no ischemia EF 67%   . Seasonal allergies   . Cataracts, bilateral   . Hypertension   . Pacemaker   . Cystitis     Scheduled Meds:  Scheduled Meds: Continuous Infusions:     PHYSICAL EXAM Noted in the chartg, but this note is written after discharge order taken offf Well developed and nourished in no acute distress HENT normal Neck supple with JVP-flat Clear Regular rate and rhythm, no murmurs or gallops Abd-soft with active BS No Clubbing cyanosis edema Skin-warm and dry A & Oriented  Grossly normal sensory and motor function  TELEMETRY: Reviewed telemetry pt in P-synchronous/ AV  pacing      LABS: Basic Metabolic Panel:  Recent Labs Lab 10/16/15 1050 10/17/15 0410 10/17/15 0504 10/17/15 0832 10/18/15 0333 10/19/15 0338 10/20/15 0510 10/21/15 1612  NA 143 138 140 139 140 139 138  --   K 4.2 4.2 4.0 4.2 4.7 3.9 4.3  --   CL 107 105 107 104 101 98* 102  --   CO2 26 20*  --  22 23 26 25   --   GLUCOSE 163* 202* 193* 236* 166* 146* 138*  --   BUN 14 17 17 17  26* 31*  32*  --   CREATININE 0.88 0.91 0.70 0.96 1.05* 1.03* 0.98 1.06*  CALCIUM 10.1 10.0  --  9.8 9.6 9.7 9.3  --    Cardiac Enzymes: No results for input(s): CKTOTAL, CKMB, CKMBINDEX, TROPONINI in the last 72 hours. CBC:  Recent Labs Lab 10/16/15 1050 10/17/15 0410 10/17/15 0504 10/21/15 1612  WBC 7.9 8.3  --  6.9  NEUTROABS 5.3 5.1  --   --   HGB 12.7 13.3 13.9 13.2  HCT 39.4 39.6 41.0 40.2  MCV 91.5 91.0  --  90.3  PLT 252.0 255  --  273   PROTIME:  Recent Labs  10/21/15 0440  LABPROT 14.9  INR 1.16   Liver Function Tests: No results for input(s): AST, ALT, ALKPHOS, BILITOT, PROT, ALBUMIN in the last 72 hours. No results for input(s): LIPASE, AMYLASE in the last 72 hours. BNP: BNP (last 3 results)  Recent Labs  10/17/15 0409  BNP 120.6*    ProBNP (last 3 results)  Recent Labs  10/16/15 1050  PROBNP 80.0      ASSESSMENT AND PLAN:  OCcluded RCA grafgt with ICM a  CHB and 100% pacing]  Caridomyoathy ischemic vs pacing  Obesity   Spoke with Dr Laurie [name] will proceed with DRT-D upgrade in a few weeks  he will see her next week to assure volume status  We have reviewed the benefits and risks of generator replacement.  These include but are not limited to lead fracture and infection.  The patient understands, agrees and is willing to proceed.     Signed, Laurie Axe MD  10/22/2015

## 2015-10-22 NOTE — Progress Notes (Signed)
Noted CM consult for patient wanting a list of Endocrinologist in the area  Patient is seeing Dr Loanne Drilling Endocrinologist and is requesting to change MD; CM instructed patient to talk to her PCP Dr Orvilla Fus for a referral to another Endocrinologist that's in her network for her insurance plan. Patient is in agreement for this and will talk to her PCP about a referral. Aneta Mins [phone]

## 2015-10-22 NOTE — Progress Notes (Signed)
Pt has orders to be discharged. Discharge instructions given and pt has no additional questions at this time. Medication regimen reviewed and pt educated. Pt verbalized understanding and has no additional questions. Telemetry box removed. IVs removed and sites in good condition. Pt stable and waiting for transportation. 

## 2015-10-22 NOTE — Discharge Summary (Signed)
Physician Discharge Summary  Patient ID: Laurie [name] MRN: [id number] DOB/AGE: 70-Jan-1947 70 y.o.  Admit date: 10/17/2015 Discharge date: 10/22/2015  Primary Physician:  Dr. Cordelia Pen  Primary Discharge Diagnosis:  1.  Acute on chronic systolic congestive heart failure  Secondary Discharge Diagnosis: 2.  Coronary artery disease with severe left main and three-vessel disease and patent LIMA graft to LAD, patent saphenous vein graft to diagonal, patent saphenous graft to OM and interval occlusion of the vein graft to the right coronary artery 3.  Dependent diabetes mellitus with severe insulin resistance and peripheral neuropathy 4.  Hypertensive heart disease 5.  Cardiac pacemaker with possible pacing induced LV dysfunction 6.  Hyperlipidemia  7.  Obesity 8.  Sleep apnea 9.  Urinary tract infection with Escherichia coli  Procedures:  CT angiogram of chest, echocardiogram, cardiac catheterization for radial approach  Consults:  Dr. [name] electrophysiology  [facility] Course: This very nice 70 year old female has a history of coronary artery disease with previous bypass grafting. She had bypass grafting for three-vessel disease in 2000 and had patent grafts at catheterization 3 years ago when she was admitted with high degree AV block and complete heart block. At that time she had a Medtronic pacemaker implanted. She has had some occasional atrial arrhythmias since then that have been controlled with verapamil. She has severe diabetes mellitus with insulin resistance. She had been doing well until November when she had a flu and pneumonia shot. Following that she began to have vague discomfort in her shoulders as well as her neck that would occur both with exertion and at rest and was described as a raw feeling. She also noted some sinusitis and eventually saw her primary care doctor's office on the 20th was treated with amoxicillin. The time she had her lung drainage from her nose  as well as a low grade fever. She got feeling somewhat better with that but also had some shortness of breath. She began to have a raw feeling in her chest that was different than her previous symptoms of aching that she had prior to her bypass grafting. She saw her primary physician again yesterday noted complaints of vague neck discomfort and swollen tonsils. She had stopped taking her Lasix at home and had been sleeping in a recliner and had some PND as well as some dyspnea on exertion. She had complained of low-grade fever as well as weight gain although her weight had not changed significantly since November. He was noted to have a resting tachycardia and was scheduled to have a chest x-ray. D-dimer was minimally elevated at 0.56. She presented to the emergency room this morning when she developed severe shortness of breath and despite wearing her C Pap  she complained of a raw feeling in her chest and was using a heating pad on her chest and came to the emergency room. A CTA did not show any evidence of pulmonary emboli and showed possible pulmonary edema. She had oxygen desaturation and was placed on BiPAP and nitroglycerin. No Lasix was given by the emergency room physician. She is admitted at this time for treatment of volume overload and congestive heart failure. EKG currently shows a paced rhythm. No troponin was done on presentation to the emergency room.   The patient was diuresed with intravenous Lasix and lost from 214-199 pounds with marked improvement in her symptoms.  An echocardiogram showed an EF of 30-35% which is markedly changed from a previous echo where her ejection fraction was normal.  She  was taken off of verapamil and diuresed.  The question of pacemaker induced LV dysfunction came up.  Because of her new onset of congestive heart failure catheterization was performed that showed occlusion of the AV sleep patent saphenous vein graft to the right coronary artery, a patent  LIMA graft to LAD, patent saphenous vein graft to diagonal and patent saphenous vein graft to marginal branch.  She had very severe distal disease in the LAD after the mammary graft and the other branches were small.  The question was raised about an upgrade to a biventricular device and after discussion with Dr. [name] we opt to upgrade to a biventricular defibrillator because of the new occlusion of the right coronary graft.  It was difficult to tell whether this is going to be scarring because of her obesity it was not felt that myocardial perfusion imaging would give much useful information to help with this decision.  She did complain of some hematuria and burning that started while she was in the hospital and she had a urine culture done that showed 30,000 colonies of Escherichia coli.  She was treated with cephalexin 500 mg twice daily and continue this on discharge.  She significantly improved will be discharged back on Lasix 40 mg daily and stool away every day.  She is to discontinue verapamil and is to change metoprolol tart tray to metoprolol succinate.  I will see her in follow-up in one week and she is to call if there are problems. Discharge Exam: Blood pressure 106/54, pulse 85, temperature 98.4 F (36.9 C), temperature source Oral, resp. rate 16, height 5' 2.5" (1.588 m), weight 90.493 kg (199 lb 8 oz), SpO2 93 %. Weight: 90.493 kg (199 lb 8 oz) (Scale B) Lungs clear, no S3  Labs: CBC:   Lab Results  Component Value Date   WBC 6.9 10/21/2015   HGB 13.2 10/21/2015   HCT 40.2 10/21/2015   MCV 90.3 10/21/2015   PLT 273 10/21/2015   CMP:  Recent Labs Lab 10/17/15 0832  10/20/15 0510 10/21/15 1612  NA 139  < > 138  --   K 4.2  < > 4.3  --   CL 104  < > 102  --   CO2 22  < > 25  --   BUN 17  < > 32*  --   CREATININE 0.96  < > 0.98 1.06*  CALCIUM 9.8  < > 9.3  --   PROT 7.2  --   --   --   BILITOT 0.6  --   --   --   ALKPHOS 77  --   --   --   ALT 33  --   --   --   AST  33  --   --   --   GLUCOSE 236*  < > 138*  --   < > = values in this interval not displayed.  Lipid Panel     Component Value Date/Time   CHOL 190 06/11/2014 1500   TRIG 276* 06/11/2014 1500   HDL 48 06/11/2014 1500   CHOLHDL 4.0 06/11/2014 1500   VLDL 55* 06/11/2014 1500   LDLCALC 87 06/11/2014 1500   BNP (last 3 results)  Recent Labs  10/16/15 1050  PROBNP 80.0   Thyroid: Lab Results  Component Value Date   TSH 1.59 10/16/2015   Hemoglobin A1C: Lab Results  Component Value Date   HGBA1C 7.3* 10/16/2015    Radiology: Mild congestive heart failure,  heart is upper limits of normal   EKG: Sinus with ventricular paced beats  Discharge Medications:   Medication List    STOP taking these medications        fexofenadine 180 MG tablet  Commonly known as:  ALLEGRA     ketoconazole 2 % cream  Commonly known as:  NIZORAL     metoprolol tartrate 25 MG tablet  Commonly known as:  LOPRESSOR     verapamil 120 MG 24 hr capsule  Commonly known as:  VERELAN PM      TAKE these medications        ACCU-CHEK SMARTVIEW CONTROL Liqd  Use to calibrate blood glucose meter.     ACCU-CHEK SMARTVIEW test strip  Generic drug:  glucose blood  USE  TO CHECK BLOOD SUGAR FOUR TIMES DAILY     AFRIN NODRIP ORIGINAL NA  Place 1-2 sprays into the nose daily as needed (CONGESTION). Reported on 10/03/2015     AMBULATORY NON FORMULARY MEDICATION  Take 1 capsule by mouth daily. Omega Q Plus - Take 1 capsule once daily     aspirin 81 MG EC tablet  Take 1 tablet (81 mg total) by mouth daily.     b complex vitamins tablet  Take 1 tablet by mouth daily.     B-D SINGLE USE SWABS REGULAR Pads  Use 8 daily for testing blood sugar and insulin injections.     BD INSULIN SYRINGE ULTRAFINE 31G X 15/64" 1 ML Misc  Generic drug:  Insulin Syringe-Needle U-100  USE SYRINGES 10 TIMES A DAY WITH INSULIN INJECTIONS AS DIRECTED.     beta carotene w/minerals tablet  Take 1 tablet by mouth  daily.     cephALEXin 500 MG capsule  Commonly known as:  KEFLEX  Take 1 capsule (500 mg total) by mouth 2 (two) times daily.     cholecalciferol 1000 units tablet  Commonly known as:  VITAMIN D  Take 2,000 Units by mouth daily.     furosemide 40 MG tablet  Commonly known as:  LASIX  Take 1 tablet (40 mg total) by mouth daily.     insulin regular 100 units/mL injection  Commonly known as:  NOVOLIN R RELION  INJECT 150 UNITS WITH BREAKFAST,170 UNITS WITH LUNCH,150 UNITS WITH THE EVENING MEAL     lisinopril 40 MG tablet  Commonly known as:  PRINIVIL,ZESTRIL  Take 1 tablet (40 mg total) by mouth daily.     metoprolol succinate 50 MG 24 hr tablet  Commonly known as:  TOPROL-XL  Take 1 tablet (50 mg total) by mouth daily. Take with or immediately following a meal.     potassium chloride SA 20 MEQ tablet  Commonly known as:  KLOR-CON M20  Take 1 tablet (20 mEq total) by mouth daily.     PROBIOTIC DAILY Caps  Take 1 capsule by mouth daily.     sodium chloride 0.65 % nasal spray  Commonly known as:  OCEAN  Place 1 spray into the nose as needed for congestion.        Followup plans and appointments: Follow-up with Dr. Wynonia Lawman in one week.  She is also to return to the hospital for a biventricular ICD upgrade in 2 weeks.  Time spent with patient to include physician time:  45 minutes   Signed: W. Doristine Church. MD [facility] 10/22/2015, 1:33 PM

## 2015-10-22 NOTE — Progress Notes (Signed)
Inpatient Diabetes Program Recommendations  AACE/ADA: New Consensus Statement on Inpatient Glycemic Control (2015)  Target Ranges:  Prepandial:   less than 140 mg/dL      Peak postprandial:   less than 180 mg/dL (1-2 hours)      Critically ill patients:  140 - 180 mg/dL  Results for Laurie [name], Laurie [name] (MRN [id number]) as of 10/22/2015 08:25  Ref. Range 10/21/2015 05:50 10/21/2015 11:42 10/21/2015 16:29 10/22/2015 05:19 10/22/2015 08:09  Glucose-Capillary Latest Ref Range: 65-99 mg/dL 128 (H) 178 (H) 291 (H) 73 128 (H)   Review of Glycemic Control  Current orders for Inpatient glycemic control: Novolog 100 units TID with meals  Inpatient Diabetes Program Recommendations: Insulin - Meal Coverage: Patient was NPO yesterday during the day and only ate supper on 10/21/15 and received Novolog 100 units at 00:15 on 10/22/15. Noted Novolog given with meals was decreased to 100 units TID with meals. Agree with decrease in Novolog meal coverage. Fasting glucose is 128 mg/dl this morning.   Thanks, Barnie Alderman, RN, MSN, CDE Diabetes Coordinator Inpatient Diabetes Program [phone] (Team Pager from [place] to [place]) [phone] ([facility] office) [phone] [facility] office) [phone] [facility] office)

## 2015-10-24 DIAGNOSIS — Z95810 Presence of automatic (implantable) cardiac defibrillator: Secondary | ICD-10-CM | POA: Diagnosis not present

## 2015-10-28 ENCOUNTER — Encounter: Payer: Self-pay | Admitting: Cardiology

## 2015-10-28 DIAGNOSIS — I119 Hypertensive heart disease without heart failure: Secondary | ICD-10-CM | POA: Diagnosis not present

## 2015-10-28 DIAGNOSIS — Z951 Presence of aortocoronary bypass graft: Secondary | ICD-10-CM | POA: Diagnosis not present

## 2015-10-28 DIAGNOSIS — Z950 Presence of cardiac pacemaker: Secondary | ICD-10-CM | POA: Diagnosis not present

## 2015-10-28 DIAGNOSIS — E785 Hyperlipidemia, unspecified: Secondary | ICD-10-CM | POA: Diagnosis not present

## 2015-10-28 DIAGNOSIS — G4733 Obstructive sleep apnea (adult) (pediatric): Secondary | ICD-10-CM | POA: Diagnosis not present

## 2015-10-28 DIAGNOSIS — E1121 Type 2 diabetes mellitus with diabetic nephropathy: Secondary | ICD-10-CM | POA: Diagnosis not present

## 2015-10-28 DIAGNOSIS — E668 Other obesity: Secondary | ICD-10-CM | POA: Diagnosis not present

## 2015-10-28 DIAGNOSIS — I2510 Atherosclerotic heart disease of native coronary artery without angina pectoris: Secondary | ICD-10-CM | POA: Diagnosis not present

## 2015-10-28 DIAGNOSIS — I5022 Chronic systolic (congestive) heart failure: Secondary | ICD-10-CM | POA: Diagnosis not present

## 2015-10-28 NOTE — Progress Notes (Signed)
Patient ID: LESBIA LUNDELL, female   DOB: [birth year], 70 y.o.   MRN: [id number]   Jenevy, Aherne  Date of visit:  10/28/2015 DOB:  May 22, 1946    Age:  70 yrs. Medical record number:  [id number]     Account number:  [id number] Primary Care Provider: [name] ____________________________ CURRENT DIAGNOSES  1. CAD Native without angina  2. Chronic systolic heart failure  3. Hypertensive heart disease without heart failure  4. Presence of aortocoronary bypass graft  5. Hyperlipidemia  6. Type 2 diabetes mellitus with diabetic nephropathy  7. Obesity  8. Presence of cardiac pacemaker  9. Sleep apnea ____________________________ ALLERGIES  Atorvastatin, Muscle aches ____________________________ MEDICATIONS  1. Omega-3 350-235-90-640 mg capsule,delayed release(DR/EC), BID  2. ketoconazole 2 % Cream, BID  3. Probiotic 10 billion cell Capsule, PRN  4. lisinopril 40 mg Tablet, 1 p.o. daily  5. Vitamin B-12 2,500 mcg tablet, sublingual, 1 p.o. daily  6. omeprazole 20 mg capsule,delayed release(DR/EC), PRN  7. Ocutabs tablet, 1 p.o. daily  8. Glucosamine-Chondroitin-MSM 500 mg-416.6 mg-20 mg-0.6 mg tablet, 1 p.o. daily  9. aspirin 81 mg chewable tablet, 1 p.o. daily  10. Vitamin C 500 mg tablet, 1 p.o. daily  11. furosemide 40 mg tablet, 1.5 p.o. daily  12. Allegra Allergy 180 mg tablet, 1 p.o. daily  13. Klor-Con M20 mEq tablet,extended release, 1 p.o. daily  14. Novolin R 100 unit/mL injection solution, 120u qam  170u lunch 150 dinner  15. metoprolol succinate ER 50 mg tablet,extended release 24 hr, 1 p.o. daily ____________________________ CHIEF COMPLAINTS  f/u p hosp ____________________________ HISTORY OF PRESENT ILLNESS Patient returns for cardiac followup. She was hospitalized with congestive heart failure and was taken off of verapamil. Her ejection fraction was around 30-35%. It was thought that she might have pacemaker induced dysfunction although she had occluded one  graft to the right coronary artery since her previous cath. She did not have any angina. She was diuresed and has been feeling much better. While in the hospital we talked about grading her to a biventricular device and Dr. [name] I had a discussion about whether this should be a defibrillator or a pacemaker. I will defer to his judgment on this. I had originally thought that because she has significant distal disease and had occluded her graft but in the long run a defibrillator might be a better option. Since discharge she has gained about 2 pounds of weight. She denies angina and has no PND, orthopnea or edema. She has no significant claudication but does have significant neuropathy. ____________________________ PAST HISTORY  Past Medical Illnesses:  hypertension, DM-non-insulin dependent, hyperlipidemia, obesity, sleep apnea, carpal tunnel syndrome, plantar fasicitis, Bell'spalsy, peripheral neuropathy, thyroid nodules, lumbar disc disease;  Cardiovascular Illnesses:  CAD;  Surgical Procedures:  CABG w LIMA to LAD, SVG to dx, SVG to OM, SVG to PDA 07/22/99 Dr. [name], tubal ligation, cyst removed right finger, surgery for heel spur, carpal tunnel release;  NYHA Classification:  II;  Canadian Angina Classification:  Class 0: Asymptomatic;  Cardiology Procedures-Invasive:  cardiac cath (left) February 2014, Medtronic pacemaker implant February 2014;  Cardiology Procedures-Noninvasive:  echocardiogram June 2011, lexiscan cardiolite August 2012, echocardiogram February 2014;  Cardiac Cath Results:  70% stenosis distal Left main, occluded LAD CFX RCA, widely patent Diag 1 OM 1 PDA SVG, widely patent LAD LIMA graft;  LVEF of 60% documented via echocardiogram on 04/12/2014,   ____________________________ CARDIO-PULMONARY TEST DATES EKG Date:  05/06/2015;   Cardiac Cath Date:  10/23/2012;  CABG: 07/22/1999;  Nuclear Study Date:  04/22/2011;  Echocardiography Date: 10/22/2012;  Chest Xray Date: 10/27/2012;    ____________________________ FAMILY HISTORY Father -- Father dead, Coronary Artery Disease Mother -- Mother dead, Diabetes mellitus Sister -- Sister alive with problem, Hypercholesterolemia Sister -- Sister alive with problem, Diabetes mellitus Sister -- Sister alive and well Sister -- Sister alive and well ____________________________ SOCIAL HISTORY Alcohol Use:  no alcohol use;  Smoking:  never smoked;  Diet:  regular diet;  Lifestyle:  married;  Exercise:  some exercise;  Occupation:  homemaker;  Residence:  lives with husband and son still at home;  Spouse's Occupation:  retired, worked for railroad ____________________________ REVIEW OF SYSTEMS General:  obesity Eyes: wears eye glasses/contact lenses, no diabetic retinopathy, cataract extraction bilaterally Respiratory: dyspnea with exertion Cardiovascular:  please review HPI Abdominal: denies dyspepsia, GI bleeding, constipation, or diarrhea Musculoskeletal:  nocturnal cramps, chronic low back pain, arthritis of the hips knees Neurological:  unsteady gait Endocrine: neuropathy  ____________________________ PHYSICAL EXAMINATION VITAL SIGNS  Blood Pressure:  124/60 Sitting, Left arm, regular cuff  , 124/64 Standing, Left arm and regular cuff   Pulse:  98/min. Weight:  206.00 lbs. Height:  64"BMI: 35  Constitutional:  pleasant white female, in no acute distress, moderately obese Skin:  warm and dry to touch, no apparent skin lesions, or masses noted. Head:  normocephalic, normal hair pattern, no masses or tenderness Neck:  supple, no masses, thyromegaly, JVD. Carotid pulses are full and equal bilaterally without bruits. Chest:  clear to auscultation, healed median sternotomy scar, healed pacemaker incision in the left pectoral area Cardiac:  regular rhythm, normal S1 and S2, no S3 or S4, grade 1/6 systolic murmur heard best at the base Peripheral Pulses:  pulses full and equal in all extremities Extremities & Back:  well healed  saphenous vein donor site RLE, 1+ edema  ____________________________ MOST RECENT LIPID PANEL 06/11/14  CHOL TOTL 190 mg/dl, LDL 87 NM, HDL 48 mg/dl and TRIGLYCER 276 mg/dl ____________________________ IMPRESSIONS/PLAN  1. Chronic systolic heart failure possibly pacemaker induced LV dysfunction versus contribution from verapamil 2. Coronary artery disease with bypass graft disease 3. Severe diabetes mellitus insulin-dependent with peripheral neuropathy 4. Obesity with need to lose weight 5. Functioning permanent pacemaker  Recommendations:  Long discussion with her about pacemaker upgrade. She had limited understanding of what was going to be done and we spent a long time today talking about this. I asked her to have a further conversation with Dr. [name] to be sure that she and her husband fully understand the process and the implications prior to having this done. I asked her to increase her furosemide 80 mg in the morning and 40 mg in the evening. Once she is stable down the road we'll add spironolactone to her regimen. I will see her in followup in one month.  ____________________________ TODAYS ORDERS  1. Return Visit: 1 month  2. Basic Metabolic Panel: Today                       ____________________________ Cardiology Physician:  Kerry Hough MD [facility]

## 2015-11-03 ENCOUNTER — Encounter: Payer: Self-pay | Admitting: Internal Medicine

## 2015-11-03 ENCOUNTER — Ambulatory Visit (INDEPENDENT_AMBULATORY_CARE_PROVIDER_SITE_OTHER): Payer: Commercial Managed Care - HMO | Admitting: Internal Medicine

## 2015-11-03 VITALS — BP 124/56 | HR 86 | Ht 62.5 in | Wt 204.0 lb

## 2015-11-03 DIAGNOSIS — I442 Atrioventricular block, complete: Secondary | ICD-10-CM

## 2015-11-03 DIAGNOSIS — Z950 Presence of cardiac pacemaker: Secondary | ICD-10-CM

## 2015-11-03 DIAGNOSIS — I480 Paroxysmal atrial fibrillation: Secondary | ICD-10-CM | POA: Diagnosis not present

## 2015-11-03 DIAGNOSIS — I429 Cardiomyopathy, unspecified: Secondary | ICD-10-CM

## 2015-11-03 LAB — CUP PACEART INCLINIC DEVICE CHECK
Battery Remaining Longevity: 120 mo
Brady Statistic AP VP Percent: 5 %
Brady Statistic AS VP Percent: 90 %
Brady Statistic AS VS Percent: 4 %
Date Time Interrogation Session: 20170220132447
Implantable Lead Implant Date: 20140210
Implantable Lead Location: 753860
Implantable Lead Model: 5076
Lead Channel Impedance Value: 478 Ohm
Lead Channel Pacing Threshold Amplitude: 0.5 V
Lead Channel Pacing Threshold Amplitude: 0.75 V
Lead Channel Pacing Threshold Pulse Width: 0.4 ms
Lead Channel Pacing Threshold Pulse Width: 0.4 ms
Lead Channel Sensing Intrinsic Amplitude: 15.67 mV
Lead Channel Setting Pacing Amplitude: 2.5 V
MDC IDC LEAD IMPLANT DT: 20140210
MDC IDC LEAD LOCATION: 753859
MDC IDC MSMT BATTERY IMPEDANCE: 183 Ohm
MDC IDC MSMT BATTERY VOLTAGE: 2.8 V
MDC IDC MSMT LEADCHNL RA PACING THRESHOLD AMPLITUDE: 1 V
MDC IDC MSMT LEADCHNL RA PACING THRESHOLD PULSEWIDTH: 0.4 ms
MDC IDC MSMT LEADCHNL RA SENSING INTR AMPL: 4 mV
MDC IDC MSMT LEADCHNL RV IMPEDANCE VALUE: 719 Ohm
MDC IDC MSMT LEADCHNL RV PACING THRESHOLD AMPLITUDE: 0.5 V
MDC IDC MSMT LEADCHNL RV PACING THRESHOLD PULSEWIDTH: 0.4 ms
MDC IDC SET LEADCHNL RA PACING AMPLITUDE: 2 V
MDC IDC SET LEADCHNL RV PACING PULSEWIDTH: 0.4 ms
MDC IDC SET LEADCHNL RV SENSING SENSITIVITY: 4 mV
MDC IDC STAT BRADY AP VS PERCENT: 0 %

## 2015-11-03 NOTE — Progress Notes (Signed)
Patient Care Team: Marletta Lor, MD as PCP - General Jacolyn Reedy, MD as Consulting Physician (Cardiology)   HPI  Laurie [name] is a 70 y.o. female Seen in followup for pacemaker implantation 2/14 for intermittent complete heart block and syncope. she is also noted to have some degree of sinus tachycardia with her mean heart rate right at 100 beats per minute  she has been intolerant of beta blockers and is currently on calcium blocker.   She was recently hospitalized for congestive heart failure was noted to have progressive left ventricular dysfunction and acute on chronic heart failure. The issue was raised as to whether this was pacemaker-induced cardiomyopathy as her ejection fraction  had gone to 30-35% with the evolution of 100% ventricular pacing whereas with no ventricular pacing in 7/15 LV function was normal.   She has a history of coronary artery disease with prior CABG. [place] 2/14 demonstrated patent vein graft to the diagonal OM RCA and LIMA normal left ventricular function  she underwent catheterization during that hospitalization. He had demonstrated interval occlusion of the SVG--RCA. It was postulated that this might explain the development of complete heart block  Past Medical History  Diagnosis Date  . Bell's palsy 01/05/2010  . Multinodular goiter   . Hepatitis   . Thyromegaly     mild  . 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 Hyperlipidemia     Intolerance to several statins   . Hypertensive heart disease without CHF   . HIstory of Bell's palsy     October 2012   . Obesity (BMI 30-39.9)   . Peripheral neuropathy ([place])   . Sleep apnea   . CAD (coronary artery disease)     Cath  07/21/1999  mild ostial, 80% stenosis proximal LAD, 90% stenosis proximal Diag 1, 95% stenosis proximal OM 1, 80% stenosis proximal RCA, 95% stenosis mid RCA    CABG 07/22/99  LIMA to LAD, SVG to dx, SVG to OM, SVG  to PDA  Dr. Carmin Muskrat 2012 no ischemia EF 67%   . Seasonal allergies   . Cataracts, bilateral   . Hypertension   . Pacemaker   . Cystitis     Past Surgical History  Procedure Laterality Date  . Coronary artery bypass graft  2000  . Tubal ligation    . Biopsy thyroid  05/2010    percutaneous  . Foot surgery    . Carpal tunnel release    . Dental surgery    . Temporary pacemaker insertion N/A 10/22/2012    Procedure: TEMPORARY PACEMAKER INSERTION;  Surgeon: Troy Sine, MD;  Location: [facility] CATH LAB;  Service: Cardiovascular;  Laterality: N/A;  . Left heart catheterization with coronary angiogram N/A 10/23/2012    Procedure: LEFT HEART CATHETERIZATION WITH CORONARY ANGIOGRAM;  Surgeon: Jacolyn Reedy, MD;  Location: [facility] CATH LAB;  Service: Cardiovascular;  Laterality: N/A;  . Permanent pacemaker insertion N/A 10/23/2012    Procedure: PERMANENT PACEMAKER INSERTION;  Surgeon: Deboraha Sprang, MD;  Location: [facility] CATH LAB;  Service: Cardiovascular;  Laterality: N/A;  . Lead revision N/A 10/25/2012    Procedure: LEAD REVISION;  Surgeon: Evans Lance, MD;  Location: [facility] CATH LAB;  Service: Cardiovascular;  Laterality: N/A;  . Eye surgery    . Cataract extraction w/ intraocular lens implant Left 12/16/2014  . Cataract extraction w/ intraocular lens  implant, bilateral Right 01/06/2015  .  Dg 3rd digit right hand      cystectomy  . Cardiac catheterization N/A 10/21/2015    Procedure: Left Heart Cath and Cors/Grafts Angiography;  Surgeon: Belva Crome, MD;  Location: [place] CV LAB;  Service: Cardiovascular;  Laterality: N/A;    Current Outpatient Prescriptions  Medication Sig Dispense Refill  . ACCU-CHEK SMARTVIEW test strip USE  TO CHECK BLOOD SUGAR FOUR TIMES DAILY 400 each 1  . Alcohol Swabs (B-D SINGLE USE SWABS REGULAR) PADS Use 8 daily for testing blood sugar and insulin injections. 800 each 1  . AMBULATORY NON FORMULARY MEDICATION Take 1 capsule by mouth daily. Omega Q Plus -  Take 1 capsule once daily    . aspirin EC 81 MG EC tablet Take 1 tablet (81 mg total) by mouth daily.    [name] b complex vitamins tablet Take 1 tablet by mouth daily.    . BD INSULIN SYRINGE ULTRAFINE 31G X 15/64" 1 ML MISC USE SYRINGES 10 TIMES A DAY WITH INSULIN INJECTIONS AS DIRECTED. 300 each 0  . beta carotene w/minerals (OCUVITE) tablet Take 1 tablet by mouth daily.    . Blood Glucose Calibration (ACCU-CHEK SMARTVIEW CONTROL) LIQD Use to calibrate blood glucose meter. 3 each 1  . cholecalciferol (VITAMIN D) 1000 UNITS tablet Take 2,000 Units by mouth daily.    . furosemide (LASIX) 40 MG tablet Take 40-80 mg by mouth 2 (two) times daily. 80 mg in the am and 40 mg in the evening. Take an extra 40mg  at night for weight gain of 3lbs or more in one day.    . lisinopril (PRINIVIL,ZESTRIL) 40 MG tablet Take 1 tablet (40 mg total) by mouth daily. 90 tablet 1  . metoprolol succinate (TOPROL-XL) 50 MG 24 hr tablet Take 1 tablet (50 mg total) by mouth daily. Take with or immediately following a meal. 30 tablet 12  . NOVOLIN R RELION 100 UNIT/ML injection Inject into the skin 3 (three) times daily before meals. 120 at breakfast, 170 at lunch, 150 at dinner    . Oxymetazoline HCl (AFRIN NODRIP ORIGINAL NA) Place 1-2 sprays into the nose daily as needed (CONGESTION). Reported on 10/03/2015    . potassium chloride SA (KLOR-CON M20) 20 MEQ tablet Take 1 tablet (20 mEq total) by mouth daily. 180 tablet 2  . Probiotic Product (PROBIOTIC DAILY) CAPS Take 1 capsule by mouth daily.    . sodium chloride (OCEAN) 0.65 % nasal spray Place 1 spray into the nose as needed for congestion.     No current facility-administered medications for this visit.    Allergies  Allergen Reactions  . Statins     Intolerance to several  . Tricor [Fenofibrate]     Leg pain    Review of Systems negative except from HPI and PMH  Physical Exam BP 124/56 mmHg  Pulse 86  Ht 5' 2.5" (1.588 m)  Wt 204 lb (92.534 kg)  BMI 36.69  kg/m2 Well developed and well nourished in no acute distress HENT asymmetric smile with right labial fold flattening E scleral and icterus clear Neck Supple JVP flat; carotids brisk and full Clear to ausculation  Device pocket well healed; without hematoma or erythema.  There is no tethering  NO COLLATERALS Regular rate and rhythm, no murmurs gallops or rub Soft with active bowel sounds No clubbing cyanosis Trace Edema Alert and oriented, grossly normal motor and sensory function Skin Warm and Dry  ECG sinus at 87 with PVC this pacing Intervals 19/17/43 Atypical  left bundle branch block with Q waves 1 and L and R as V6    Assessment and  Plan Complete heart block permanent  Pacemaker-Medtronic The patient's device was interrogated.  The information was reviewed. No changes were made in the programming.   Sinus tachycardia  Hypertension  Well controlled   Coronary artery disease status post CABG previously normal LV function  .Without symptoms of ischemia  Cardiomyopathy ? Mechanism  Ischemic vs pacemaker induced  With a recent hospitalization for heart failure, she underwent catheterization demonstrating interval occlusion of her RCA graft. This could have contributed to her LV function deterioration as could the temporally associated complete heart block resulting in pacemaker dependence and RV apical pacing.  Given the LV dysfunction at this juncture, we will plan CRT-D upgrade. The patient voices understanding of the purpose and hoped for benefits with LV funciton and ICD risk reduction

## 2015-11-04 ENCOUNTER — Telehealth: Payer: Self-pay | Admitting: Internal Medicine

## 2015-11-04 NOTE — Telephone Encounter (Signed)
I spoke with the patient and advised her of her instructions for her procedure tomorrow.

## 2015-11-04 NOTE — Telephone Encounter (Signed)
I left a message for the patient to call my direct extension this afternoon- advised I would be here until 6 pm.

## 2015-11-04 NOTE — Telephone Encounter (Signed)
New Message  Pt called to discuss CATH details

## 2015-11-05 ENCOUNTER — Encounter (HOSPITAL_COMMUNITY): Payer: Self-pay | Admitting: General Practice

## 2015-11-05 ENCOUNTER — Ambulatory Visit (HOSPITAL_COMMUNITY)
Admission: RE | Admit: 2015-11-05 | Discharge: 2015-11-06 | Disposition: A | Discharge status: Home / Self Care | Payer: Commercial Managed Care - HMO | Source: Ambulatory Visit | Attending: Internal Medicine | Admitting: Internal Medicine

## 2015-11-05 ENCOUNTER — Ambulatory Visit (HOSPITAL_BASED_OUTPATIENT_CLINIC_OR_DEPARTMENT_OTHER): Payer: Commercial Managed Care - HMO

## 2015-11-05 ENCOUNTER — Encounter (HOSPITAL_COMMUNITY)
Admission: RE | Disposition: A | Discharge status: Home / Self Care | Payer: Self-pay | Source: Ambulatory Visit | Attending: Internal Medicine

## 2015-11-05 DIAGNOSIS — E1142 Type 2 diabetes mellitus with diabetic polyneuropathy: Secondary | ICD-10-CM | POA: Insufficient documentation

## 2015-11-05 DIAGNOSIS — I110 Hypertensive heart disease with heart failure: Secondary | ICD-10-CM | POA: Diagnosis not present

## 2015-11-05 DIAGNOSIS — E669 Obesity, unspecified: Secondary | ICD-10-CM | POA: Insufficient documentation

## 2015-11-05 DIAGNOSIS — I509 Heart failure, unspecified: Secondary | ICD-10-CM | POA: Diagnosis not present

## 2015-11-05 DIAGNOSIS — G4730 Sleep apnea, unspecified: Secondary | ICD-10-CM | POA: Diagnosis not present

## 2015-11-05 DIAGNOSIS — Z6836 Body mass index (BMI) 36.0-36.9, adult: Secondary | ICD-10-CM | POA: Diagnosis not present

## 2015-11-05 DIAGNOSIS — Z7982 Long term (current) use of aspirin: Secondary | ICD-10-CM | POA: Diagnosis not present

## 2015-11-05 DIAGNOSIS — Z794 Long term (current) use of insulin: Secondary | ICD-10-CM | POA: Diagnosis not present

## 2015-11-05 DIAGNOSIS — I429 Cardiomyopathy, unspecified: Secondary | ICD-10-CM | POA: Diagnosis not present

## 2015-11-05 DIAGNOSIS — I319 Disease of pericardium, unspecified: Secondary | ICD-10-CM | POA: Diagnosis not present

## 2015-11-05 DIAGNOSIS — Z951 Presence of aortocoronary bypass graft: Secondary | ICD-10-CM | POA: Insufficient documentation

## 2015-11-05 DIAGNOSIS — I255 Ischemic cardiomyopathy: Secondary | ICD-10-CM | POA: Insufficient documentation

## 2015-11-05 DIAGNOSIS — I442 Atrioventricular block, complete: Secondary | ICD-10-CM | POA: Insufficient documentation

## 2015-11-05 DIAGNOSIS — Z959 Presence of cardiac and vascular implant and graft, unspecified: Secondary | ICD-10-CM

## 2015-11-05 DIAGNOSIS — R000 Tachycardia, unspecified: Secondary | ICD-10-CM | POA: Insufficient documentation

## 2015-11-05 DIAGNOSIS — E785 Hyperlipidemia, unspecified: Secondary | ICD-10-CM | POA: Insufficient documentation

## 2015-11-05 DIAGNOSIS — E042 Nontoxic multinodular goiter: Secondary | ICD-10-CM | POA: Diagnosis not present

## 2015-11-05 DIAGNOSIS — Z45010 Encounter for checking and testing of cardiac pacemaker pulse generator [battery]: Secondary | ICD-10-CM | POA: Insufficient documentation

## 2015-11-05 DIAGNOSIS — I2510 Atherosclerotic heart disease of native coronary artery without angina pectoris: Secondary | ICD-10-CM | POA: Insufficient documentation

## 2015-11-05 HISTORY — DX: Obstructive sleep apnea (adult) (pediatric): G47.33

## 2015-11-05 HISTORY — PX: BI-VENTRICULAR PACEMAKER UPGRADE: SHX5752

## 2015-11-05 HISTORY — PX: EP IMPLANTABLE DEVICE: SHX172B

## 2015-11-05 HISTORY — DX: Dependence on other enabling machines and devices: Z99.89

## 2015-11-05 HISTORY — DX: Type 2 diabetes mellitus without complications: E11.9

## 2015-11-05 LAB — CBC
HEMATOCRIT: 39.7 % (ref 36.0–46.0)
Hemoglobin: 13.1 g/dL (ref 12.0–15.0)
MCH: 30.2 pg (ref 26.0–34.0)
MCHC: 33 g/dL (ref 30.0–36.0)
MCV: 91.5 fL (ref 78.0–100.0)
Platelets: 233 10*3/uL (ref 150–400)
RBC: 4.34 MIL/uL (ref 3.87–5.11)
RDW: 13.1 % (ref 11.5–15.5)
WBC: 5.5 10*3/uL (ref 4.0–10.5)

## 2015-11-05 LAB — BASIC METABOLIC PANEL
ANION GAP: 13 (ref 5–15)
BUN: 35 mg/dL — ABNORMAL HIGH (ref 6–20)
CALCIUM: 9.5 mg/dL (ref 8.9–10.3)
CO2: 26 mmol/L (ref 22–32)
Chloride: 102 mmol/L (ref 101–111)
Creatinine, Ser: 1.06 mg/dL — ABNORMAL HIGH (ref 0.44–1.00)
GFR calc Af Amer: >60 mL/min (ref >60)
GFR calc non Af Amer: 52 mL/min — ABNORMAL LOW (ref >60)
GLUCOSE: 122 mg/dL — AB (ref 65–99)
Potassium: 4.1 mmol/L (ref 3.5–5.1)
Sodium: 141 mmol/L (ref 135–145)

## 2015-11-05 LAB — PROTIME-INR
INR: 1.04 (ref 0.00–1.49)
Prothrombin Time: 13.8 seconds (ref 11.6–15.2)

## 2015-11-05 LAB — GLUCOSE, CAPILLARY
GLUCOSE-CAPILLARY: 338 mg/dL — AB (ref 65–99)
GLUCOSE-CAPILLARY: 298 mg/dL — AB (ref 65–99)
Glucose-Capillary: 131 mg/dL — ABNORMAL HIGH (ref 65–99)
Glucose-Capillary: 173 mg/dL — ABNORMAL HIGH (ref 65–99)

## 2015-11-05 LAB — SURGICAL PCR SCREEN
MRSA, PCR: NEGATIVE
STAPHYLOCOCCUS AUREUS: NEGATIVE

## 2015-11-05 SURGERY — BIV UPGRADE

## 2015-11-05 MED ORDER — B COMPLEX PO TABS: 1.0000 | ORAL_TABLET | Freq: Every day | ORAL | Status: DC

## 2015-11-05 MED ORDER — HEPARIN (PORCINE) IN NACL 2-0.9 UNIT/ML-% IJ SOLN
INTRAMUSCULAR | Status: DC | PRN
  Administered 2015-11-05: 10:00:00

## 2015-11-05 MED ORDER — LIDOCAINE HCL (PF) 1 % IJ SOLN
INTRAMUSCULAR | Status: DC | PRN
  Administered 2015-11-05: 60 mL

## 2015-11-05 MED ORDER — CEFAZOLIN SODIUM-DEXTROSE 2-3 GM-% IV SOLR
2.0000 g | INTRAVENOUS | Status: AC
  Administered 2015-11-05: 2 g via INTRAVENOUS

## 2015-11-05 MED ORDER — CEFAZOLIN SODIUM-DEXTROSE 2-3 GM-% IV SOLR
INTRAVENOUS | Status: AC
  Filled 2015-11-05: qty 50

## 2015-11-05 MED ORDER — MUPIROCIN 2 % EX OINT
TOPICAL_OINTMENT | Freq: Two times a day (BID) | CUTANEOUS | Status: DC
  Filled 2015-11-05: qty 22

## 2015-11-05 MED ORDER — SODIUM CHLORIDE 0.9 % IV SOLN
INTRAVENOUS | Status: DC
Start: 2015-11-05 — End: 2015-11-05
  Administered 2015-11-05: 07:00:00 via INTRAVENOUS

## 2015-11-05 MED ORDER — POTASSIUM CHLORIDE CRYS ER 20 MEQ PO TBCR
20.0000 meq | EXTENDED_RELEASE_TABLET | Freq: Every day | ORAL | Status: DC
  Administered 2015-11-05 – 2015-11-06 (×2): 20 meq via ORAL
  Filled 2015-11-05 (×2): qty 1

## 2015-11-05 MED ORDER — CEFAZOLIN SODIUM 1-5 GM-% IV SOLN
1.0000 g | Freq: Four times a day (QID) | INTRAVENOUS | Status: AC
  Administered 2015-11-05 – 2015-11-06 (×3): 1 g via INTRAVENOUS
  Filled 2015-11-05 (×2): qty 50

## 2015-11-05 MED ORDER — FENTANYL CITRATE (PF) 100 MCG/2ML IJ SOLN
INTRAMUSCULAR | Status: AC
  Filled 2015-11-05: qty 2

## 2015-11-05 MED ORDER — FUROSEMIDE 40 MG PO TABS
40.0000 mg | ORAL_TABLET | Freq: Every evening | ORAL | Status: DC
  Administered 2015-11-05: 40 mg via ORAL
  Filled 2015-11-05: qty 1

## 2015-11-05 MED ORDER — CHLORHEXIDINE GLUCONATE 4 % EX LIQD
60.0000 mL | Freq: Once | CUTANEOUS | Status: DC
  Filled 2015-11-05: qty 60

## 2015-11-05 MED ORDER — SODIUM CHLORIDE 0.9 % IV SOLN
INTRAVENOUS | Status: AC
  Administered 2015-11-05: 500 mL/h via INTRAVENOUS

## 2015-11-05 MED ORDER — B COMPLEX-C PO TABS
1.0000 | ORAL_TABLET | Freq: Every day | ORAL | Status: DC
  Administered 2015-11-06: 1 via ORAL
  Filled 2015-11-05: qty 1

## 2015-11-05 MED ORDER — MUPIROCIN 2 % EX OINT
TOPICAL_OINTMENT | CUTANEOUS | Status: AC
  Administered 2015-11-05: 1
  Filled 2015-11-05: qty 22

## 2015-11-05 MED ORDER — YOU HAVE A PACEMAKER BOOK
Freq: Once | Status: AC
  Administered 2015-11-05: 22:00:00
  Filled 2015-11-05: qty 1

## 2015-11-05 MED ORDER — SODIUM CHLORIDE 0.9 % IR SOLN
80.0000 mg | Status: AC
  Administered 2015-11-05: 80 mg
  Filled 2015-11-05: qty 2

## 2015-11-05 MED ORDER — MIDAZOLAM HCL 5 MG/5ML IJ SOLN
INTRAMUSCULAR | Status: AC
  Filled 2015-11-05: qty 5

## 2015-11-05 MED ORDER — SODIUM CHLORIDE 0.9 % IV SOLN
INTRAVENOUS | Status: DC
  Administered 2015-11-05: 07:00:00 via INTRAVENOUS

## 2015-11-05 MED ORDER — OCUVITE PO TABS: 1.0000 | ORAL_TABLET | Freq: Every day | ORAL | Status: DC

## 2015-11-05 MED ORDER — PERFLUTREN LIPID MICROSPHERE
1.0000 mL | INTRAVENOUS | Status: AC | PRN
  Administered 2015-11-05: 2 mL via INTRAVENOUS
  Filled 2015-11-05: qty 10

## 2015-11-05 MED ORDER — HEPARIN (PORCINE) IN NACL 2-0.9 UNIT/ML-% IJ SOLN
INTRAMUSCULAR | Status: AC
  Filled 2015-11-05: qty 500

## 2015-11-05 MED ORDER — FLORA-Q PO CAPS
1.0000 | ORAL_CAPSULE | Freq: Every day | ORAL | Status: DC
  Administered 2015-11-06: 1 via ORAL
  Filled 2015-11-05: qty 1

## 2015-11-05 MED ORDER — CEFAZOLIN SODIUM 1-5 GM-% IV SOLN
INTRAVENOUS | Status: AC
  Filled 2015-11-05: qty 50

## 2015-11-05 MED ORDER — MIDAZOLAM HCL 5 MG/5ML IJ SOLN
INTRAMUSCULAR | Status: DC | PRN
  Administered 2015-11-05: 1 mg via INTRAVENOUS
  Administered 2015-11-05: 2 mg via INTRAVENOUS
  Administered 2015-11-05 (×5): 1 mg via INTRAVENOUS

## 2015-11-05 MED ORDER — LISINOPRIL 40 MG PO TABS
40.0000 mg | ORAL_TABLET | Freq: Every day | ORAL | Status: DC
  Administered 2015-11-06: 40 mg via ORAL
  Filled 2015-11-05: qty 1

## 2015-11-05 MED ORDER — VITAMIN D 1000 UNITS PO TABS
2000.0000 [IU] | ORAL_TABLET | Freq: Every day | ORAL | Status: DC
  Administered 2015-11-05 – 2015-11-06 (×2): 2000 [IU] via ORAL
  Filled 2015-11-05 (×2): qty 2

## 2015-11-05 MED ORDER — ACETAMINOPHEN 325 MG PO TABS
325.0000 mg | ORAL_TABLET | ORAL | Status: DC | PRN
  Administered 2015-11-05: 650 mg via ORAL
  Filled 2015-11-05: qty 2

## 2015-11-05 MED ORDER — ASPIRIN EC 81 MG PO TBEC
81.0000 mg | DELAYED_RELEASE_TABLET | Freq: Every day | ORAL | Status: DC
  Administered 2015-11-05 – 2015-11-06 (×2): 81 mg via ORAL
  Filled 2015-11-05 (×2): qty 1

## 2015-11-05 MED ORDER — METOPROLOL SUCCINATE ER 50 MG PO TB24
50.0000 mg | ORAL_TABLET | Freq: Every day | ORAL | Status: DC
  Administered 2015-11-06: 50 mg via ORAL
  Filled 2015-11-05: qty 1

## 2015-11-05 MED ORDER — IOHEXOL 350 MG/ML SOLN
INTRAVENOUS | Status: DC | PRN
  Administered 2015-11-05: 15 mL via INTRAVENOUS

## 2015-11-05 MED ORDER — PERFLUTREN LIPID MICROSPHERE
INTRAVENOUS | Status: AC
  Filled 2015-11-05: qty 10

## 2015-11-05 MED ORDER — FUROSEMIDE 80 MG PO TABS
80.0000 mg | ORAL_TABLET | Freq: Every day | ORAL | Status: DC
  Administered 2015-11-06: 80 mg via ORAL
  Filled 2015-11-05: qty 1

## 2015-11-05 MED ORDER — SODIUM CHLORIDE 0.9 % IR SOLN
Status: AC
  Filled 2015-11-05: qty 2

## 2015-11-05 MED ORDER — ONDANSETRON HCL 4 MG/2ML IJ SOLN: 4.0000 mg | Freq: Four times a day (QID) | INTRAMUSCULAR | Status: DC | PRN

## 2015-11-05 MED ORDER — LIDOCAINE HCL (PF) 1 % IJ SOLN
INTRAMUSCULAR | Status: AC
  Filled 2015-11-05: qty 60

## 2015-11-05 MED ORDER — IOHEXOL 350 MG/ML SOLN
INTRAVENOUS | Status: DC | PRN
  Administered 2015-11-05: 10 mL via INTRA_ARTERIAL

## 2015-11-05 MED ORDER — FENTANYL CITRATE (PF) 100 MCG/2ML IJ SOLN
INTRAMUSCULAR | Status: DC | PRN
  Administered 2015-11-05 (×3): 25 ug via INTRAVENOUS
  Administered 2015-11-05: 50 ug via INTRAVENOUS
  Administered 2015-11-05 (×3): 25 ug via INTRAVENOUS

## 2015-11-05 SURGICAL SUPPLY — 21 items
ADAPTER SEALING SSA-EW-09 (MISCELLANEOUS) ×1 IMPLANT
ADPR INTRO LNG 9FR SL XTD WNG (MISCELLANEOUS) ×1
ASSURA CRTD CD3369-40C (ICD Generator) ×2 IMPLANT
CABLE SURGICAL S-101-97-12 (CABLE) ×1 IMPLANT
CATH ATTAIN COM SURV 6250V-MB2 (CATHETERS) ×1 IMPLANT
DEFIB ASSURA CRT-D (ICD Generator) IMPLANT
HEMOSTAT SURGICEL 2X4 FIBR (HEMOSTASIS) ×1 IMPLANT
KIT ESSENTIALS PG (KITS) ×1 IMPLANT
LEAD ATTAIN PERFOMA 4298-88CM (Lead) ×1 IMPLANT
LEAD DURATA 7122-65CM (Lead) ×1 IMPLANT
LEAD QUARTET 1458QL-86 (Lead) IMPLANT
PAD DEFIB LIFELINK (PAD) ×1 IMPLANT
QUARTET 1458QL-86 (Lead) ×2 IMPLANT
SHEATH CLASSIC 8F (SHEATH) ×1 IMPLANT
SHEATH CLASSIC 9.5F (SHEATH) ×1 IMPLANT
SHIELD RADPAD SCOOP 12X17 (MISCELLANEOUS) ×1 IMPLANT
SLITTER 6232ADJ (MISCELLANEOUS) ×1 IMPLANT
TRAY PACEMAKER INSERTION (PACKS) ×1 IMPLANT
WIRE ACUITY WHISPER EDS 4648 (WIRE) ×1 IMPLANT
WIRE AMPLATZ SS-J .035X180CM (WIRE) ×1 IMPLANT
WIRE HI TORQ VERSACORE-J 145CM (WIRE) ×1 IMPLANT

## 2015-11-05 NOTE — Progress Notes (Signed)
Echocardiogram 2D Echocardiogram with Definity has been performed.  Laurie [name] 11/05/2015, 2:46 PM

## 2015-11-05 NOTE — Progress Notes (Signed)
Orthopedic Tech Progress Note Patient Details:  Laurie [name] [birth year] [id number] Patient already has arm sling. Patient ID: Laurie [name], female   DOB: 01-Oct-1945, 70 y.o.   MRN: [id number]   Laurie [name] 11/05/2015, 8:16 PM

## 2015-11-05 NOTE — Interval H&P Note (Signed)
ICD Criteria  Current LVEF:30%. Within 12 months prior to implant: Yes   Heart failure history: Yes, Class III  Cardiomyopathy history: Yes, Mixed Ischemic and Non-Ischemic Cardiomyopathies.  Atrial Fibrillation/Atrial Flutter: No.  Ventricular tachycardia history: No.  Cardiac arrest history: No.  History of syndromes with risk of sudden death: No.  Previous ICD: No.  Current ICD indication: Primary  PPM indication: Yes. Pacing type: Ventricular. Greater than 40% RV pacing requirement anticipated. Indication: Complete Heart Block   Class I or II Bradycardia indication present: Yes  Beta Blocker therapy for 3 or more months: Yes, prescribed.   Ace Inhibitor/ARB therapy for 3 or more months: Yes, prescribed.   History and Physical Interval Note:  11/05/2015 7:58 AM  [name]  has presented today for surgery, with the diagnosis of hf  The various methods of treatment have been discussed with the patient and family. After consideration of risks, benefits and other options for treatment, the patient has consented to  Procedure(s): BiV Pacemaker Upgrade (N/A) as a surgical intervention .  The patient's history has been reviewed, patient examined, no change in status, stable for surgery.  I have reviewed the patient's chart and labs.  Questions were answered to the patient's satisfaction.     Laurie [name]

## 2015-11-05 NOTE — H&P (View-Only) (Signed)
Patient Care Team: Marletta Lor, MD as PCP - General Jacolyn Reedy, MD as Consulting Physician (Cardiology)   HPI  Laurie [name] is a 70 y.o. female Seen in followup for pacemaker implantation 2/14 for intermittent complete heart block and syncope. she is also noted to have some degree of sinus tachycardia with her mean heart rate right at 100 beats per minute  she has been intolerant of beta blockers and is currently on calcium blocker.   She was recently hospitalized for congestive heart failure was noted to have progressive left ventricular dysfunction and acute on chronic heart failure. The issue was raised as to whether this was pacemaker-induced cardiomyopathy as her ejection fraction  had gone to 30-35% with the evolution of 100% ventricular pacing whereas with no ventricular pacing in 7/15 LV function was normal.   She has a history of coronary artery disease with prior CABG. [place] 2/14 demonstrated patent vein graft to the diagonal OM RCA and LIMA normal left ventricular function  she underwent catheterization during that hospitalization. He had demonstrated interval occlusion of the SVG--RCA. It was postulated that this might explain the development of complete heart block  Past Medical History  Diagnosis Date  . Bell's palsy 01/05/2010  . Multinodular goiter   . Hepatitis   . Thyromegaly     mild  . 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 Hyperlipidemia     Intolerance to several statins   . Hypertensive heart disease without CHF   . HIstory of Bell's palsy     October 2012   . Obesity (BMI 30-39.9)   . Peripheral neuropathy ([place])   . Sleep apnea   . CAD (coronary artery disease)     Cath  07/21/1999  mild ostial, 80% stenosis proximal LAD, 90% stenosis proximal Diag 1, 95% stenosis proximal OM 1, 80% stenosis proximal RCA, 95% stenosis mid RCA    CABG 07/22/99  LIMA to LAD, SVG to dx, SVG to OM, SVG  to PDA  Dr. Carmin Muskrat 2012 no ischemia EF 67%   . Seasonal allergies   . Cataracts, bilateral   . Hypertension   . Pacemaker   . Cystitis     Past Surgical History  Procedure Laterality Date  . Coronary artery bypass graft  2000  . Tubal ligation    . Biopsy thyroid  05/2010    percutaneous  . Foot surgery    . Carpal tunnel release    . Dental surgery    . Temporary pacemaker insertion N/A 10/22/2012    Procedure: TEMPORARY PACEMAKER INSERTION;  Surgeon: Troy Sine, MD;  Location: [facility] CATH LAB;  Service: Cardiovascular;  Laterality: N/A;  . Left heart catheterization with coronary angiogram N/A 10/23/2012    Procedure: LEFT HEART CATHETERIZATION WITH CORONARY ANGIOGRAM;  Surgeon: Jacolyn Reedy, MD;  Location: [facility] CATH LAB;  Service: Cardiovascular;  Laterality: N/A;  . Permanent pacemaker insertion N/A 10/23/2012    Procedure: PERMANENT PACEMAKER INSERTION;  Surgeon: Deboraha Sprang, MD;  Location: [facility] CATH LAB;  Service: Cardiovascular;  Laterality: N/A;  . Lead revision N/A 10/25/2012    Procedure: LEAD REVISION;  Surgeon: Evans Lance, MD;  Location: [facility] CATH LAB;  Service: Cardiovascular;  Laterality: N/A;  . Eye surgery    . Cataract extraction w/ intraocular lens implant Left 12/16/2014  . Cataract extraction w/ intraocular lens  implant, bilateral Right 01/06/2015  .  Dg 3rd digit right hand      cystectomy  . Cardiac catheterization N/A 10/21/2015    Procedure: Left Heart Cath and Cors/Grafts Angiography;  Surgeon: Belva Crome, MD;  Location: [place] CV LAB;  Service: Cardiovascular;  Laterality: N/A;    Current Outpatient Prescriptions  Medication Sig Dispense Refill  . ACCU-CHEK SMARTVIEW test strip USE  TO CHECK BLOOD SUGAR FOUR TIMES DAILY 400 each 1  . Alcohol Swabs (B-D SINGLE USE SWABS REGULAR) PADS Use 8 daily for testing blood sugar and insulin injections. 800 each 1  . AMBULATORY NON FORMULARY MEDICATION Take 1 capsule by mouth daily. Omega Q Plus -  Take 1 capsule once daily    . aspirin EC 81 MG EC tablet Take 1 tablet (81 mg total) by mouth daily.    [name] b complex vitamins tablet Take 1 tablet by mouth daily.    . BD INSULIN SYRINGE ULTRAFINE 31G X 15/64" 1 ML MISC USE SYRINGES 10 TIMES A DAY WITH INSULIN INJECTIONS AS DIRECTED. 300 each 0  . beta carotene w/minerals (OCUVITE) tablet Take 1 tablet by mouth daily.    . Blood Glucose Calibration (ACCU-CHEK SMARTVIEW CONTROL) LIQD Use to calibrate blood glucose meter. 3 each 1  . cholecalciferol (VITAMIN D) 1000 UNITS tablet Take 2,000 Units by mouth daily.    . furosemide (LASIX) 40 MG tablet Take 40-80 mg by mouth 2 (two) times daily. 80 mg in the am and 40 mg in the evening. Take an extra 40mg  at night for weight gain of 3lbs or more in one day.    . lisinopril (PRINIVIL,ZESTRIL) 40 MG tablet Take 1 tablet (40 mg total) by mouth daily. 90 tablet 1  . metoprolol succinate (TOPROL-XL) 50 MG 24 hr tablet Take 1 tablet (50 mg total) by mouth daily. Take with or immediately following a meal. 30 tablet 12  . NOVOLIN R RELION 100 UNIT/ML injection Inject into the skin 3 (three) times daily before meals. 120 at breakfast, 170 at lunch, 150 at dinner    . Oxymetazoline HCl (AFRIN NODRIP ORIGINAL NA) Place 1-2 sprays into the nose daily as needed (CONGESTION). Reported on 10/03/2015    . potassium chloride SA (KLOR-CON M20) 20 MEQ tablet Take 1 tablet (20 mEq total) by mouth daily. 180 tablet 2  . Probiotic Product (PROBIOTIC DAILY) CAPS Take 1 capsule by mouth daily.    . sodium chloride (OCEAN) 0.65 % nasal spray Place 1 spray into the nose as needed for congestion.     No current facility-administered medications for this visit.    Allergies  Allergen Reactions  . Statins     Intolerance to several  . Tricor [Fenofibrate]     Leg pain    Review of Systems negative except from HPI and PMH  Physical Exam BP 124/56 mmHg  Pulse 86  Ht 5' 2.5" (1.588 m)  Wt 204 lb (92.534 kg)  BMI 36.69  kg/m2 Well developed and well nourished in no acute distress HENT asymmetric smile with right labial fold flattening E scleral and icterus clear Neck Supple JVP flat; carotids brisk and full Clear to ausculation  Device pocket well healed; without hematoma or erythema.  There is no tethering  NO COLLATERALS Regular rate and rhythm, no murmurs gallops or rub Soft with active bowel sounds No clubbing cyanosis Trace Edema Alert and oriented, grossly normal motor and sensory function Skin Warm and Dry  ECG sinus at 87 with PVC this pacing Intervals 19/17/43 Atypical  left bundle branch block with Q waves 1 and L and R as V6    Assessment and  Plan Complete heart block permanent  Pacemaker-Medtronic The patient's device was interrogated.  The information was reviewed. No changes were made in the programming.   Sinus tachycardia  Hypertension  Well controlled   Coronary artery disease status post CABG previously normal LV function  .Without symptoms of ischemia  Cardiomyopathy ? Mechanism  Ischemic vs pacemaker induced  With a recent hospitalization for heart failure, she underwent catheterization demonstrating interval occlusion of her RCA graft. This could have contributed to her LV function deterioration as could the temporally associated complete heart block resulting in pacemaker dependence and RV apical pacing.  Given the LV dysfunction at this juncture, we will plan CRT-D upgrade. The patient voices understanding of the purpose and hoped for benefits with LV funciton and ICD risk reduction

## 2015-11-05 NOTE — Progress Notes (Signed)
Echocardiogram done at bedside. 500cc NS bolus completed.

## 2015-11-05 NOTE — Progress Notes (Signed)
CTSP for BP 80  By the time i got there it was 95 systolic  Echo demonstrated no significant effusion

## 2015-11-06 ENCOUNTER — Ambulatory Visit (HOSPITAL_COMMUNITY): Payer: Commercial Managed Care - HMO

## 2015-11-06 ENCOUNTER — Encounter (HOSPITAL_COMMUNITY): Payer: Self-pay | Admitting: Internal Medicine

## 2015-11-06 DIAGNOSIS — Z45010 Encounter for checking and testing of cardiac pacemaker pulse generator [battery]: Secondary | ICD-10-CM | POA: Diagnosis not present

## 2015-11-06 DIAGNOSIS — I2510 Atherosclerotic heart disease of native coronary artery without angina pectoris: Secondary | ICD-10-CM | POA: Diagnosis not present

## 2015-11-06 DIAGNOSIS — E669 Obesity, unspecified: Secondary | ICD-10-CM | POA: Diagnosis not present

## 2015-11-06 DIAGNOSIS — I429 Cardiomyopathy, unspecified: Secondary | ICD-10-CM | POA: Diagnosis not present

## 2015-11-06 DIAGNOSIS — Z950 Presence of cardiac pacemaker: Secondary | ICD-10-CM | POA: Diagnosis not present

## 2015-11-06 DIAGNOSIS — I509 Heart failure, unspecified: Secondary | ICD-10-CM | POA: Diagnosis not present

## 2015-11-06 DIAGNOSIS — Z6836 Body mass index (BMI) 36.0-36.9, adult: Secondary | ICD-10-CM | POA: Diagnosis not present

## 2015-11-06 DIAGNOSIS — I255 Ischemic cardiomyopathy: Secondary | ICD-10-CM | POA: Diagnosis not present

## 2015-11-06 DIAGNOSIS — I110 Hypertensive heart disease with heart failure: Secondary | ICD-10-CM | POA: Diagnosis not present

## 2015-11-06 DIAGNOSIS — E785 Hyperlipidemia, unspecified: Secondary | ICD-10-CM | POA: Diagnosis not present

## 2015-11-06 DIAGNOSIS — I442 Atrioventricular block, complete: Secondary | ICD-10-CM | POA: Diagnosis not present

## 2015-11-06 LAB — GLUCOSE, CAPILLARY: Glucose-Capillary: 230 mg/dL — ABNORMAL HIGH (ref 65–99)

## 2015-11-06 NOTE — Op Note (Signed)
NAMEKARINNE, STUVER NO.:  [id number]  MEDICAL RECORD NO.:  [id number]  LOCATION:  [facility]                         FACILITY:  [facility]  PHYSICIAN:  Deboraha Sprang, MD, FACCDATE OF BIRTH:  12/14/1945  DATE OF PROCEDURE:  11/05/2015 DATE OF DISCHARGE:                              OPERATIVE REPORT   PREOPERATIVE DIAGNOSIS:  Nonischemic cardiomyopathy, depressed left ventricular function, pacemaker-induced cardiomyopathy with intercurrent occlusion of the right coronary artery graft.  POSTOPERATIVE DIAGNOSIS:  Nonischemic cardiomyopathy, depressed left ventricular function, pacemaker-induced cardiomyopathy with intercurrent occlusion of the right coronary artery graft; patent left subclavian vein.  PROCEDURE:  Left upper extremity venogram, implantable defibrillator insertion with a high-voltage lead, left ventricular lead with capping of the previously implanted RV lead, __________ of the RA lead, explantation of the previously implanted pacemaker.  The patient's consent was signed for CRT-P; the discussions in the __________ CRT-P, this was reviewed with the patient this morning, but the consent form had not been modified.  DESCRIPTION OF PROCEDURE:  Following obtaining of the consent, however, then and venography, the patient was treated with CPAP and then sedated. After routine prep and drape, lidocaine was infiltrated along the line of the previous incision and carried down to layer of the prepectoral fascia with care to avoid opening the pocket.  At this point, access was gained to the left subclavian vein without difficulty without the aspiration or puncture of the artery.  There was however narrowing which required dilatation with an 8-French and a 9-French sheath, and then a difficulty passing the wire, passing the sheath, past the junction of the innominate and superior vena cava.  We then used a stiff wire that allowed for deployment of the sheath into  the proximal SVC and then a St. Jude [id number] active fixation single coil defibrillator lead serial number [id number] was deployed to the right ventricular apex with a bipolar R-wave was 11 with a pace impedance of 629, threshold 0.5 at 0.4 milliseconds.  Current threshold was 0.8 mA.  There was no diaphragmatic pacing at 10 V and the current of injury was moderate.  This lead was secured to the prepectoral fascia.  We then dilated the other access site and used a Medtronic MB2 coronary sinus cannulation catheter to gain access to the coronary sinus.  A nonocclusive venogram demonstrated a high lateral branch.  It was targeted.  We then placed a St. Jude lead along its length and in every position, we ended up with diaphragmatic stimulation.  We elected to abandon this lead and used a Medtronic 4298 lead, serial number [id number] with __________ pulse pacing.  We deployed to the tip __________ mid and distal 3rd.  In this location, the bipolar __________ was 3 with a pace impedance of 508, a threshold 0.7 V at 0.5 milliseconds.  Current threshold 1.4 mA.  There was no diaphragmatic pacing at 10 V.  The 9.5-French sheath was removed.  The CS sheath was left in place and at this point, we gained access to the previously implanted pacemaker pocket, explanted the pocket, explanted the previous device, and then had to extend the pocket caudally for the much larger ICD device.  We then  attached the leads to a Keller defibrillator serial [id number].  Through the device, bipolar P-wave was 2.7 with a pace impedance of 430 threshold 1.2 V at 0.5 milliseconds.  The R-wave was greater than 12 with a pace impedance of 730, threshold 0.5 V at 0.5 millisecond and the LV impedance was 590 with a threshold 0.7, 0.5 milliseconds.  High-voltage impedance was 79 ohms.  With these acceptable parameters recorded, the abandoned RV lead was capped.  Leads in the pulse generator were placed into  the pocket, secured to the prepectoral fascia.  The pocket had been copiously irrigated with antibiotic containing saline solution and hemostasis having been assured.  We then placed Surgicel along the caudal aspect of the pocket and closed this pocket in 2 layers in normal fashion.  The wound was washed dried and a benzoin and Steri-Strip dressing was applied.  Needle counts, sponge counts, and instrument counts were correct at the end of the procedure.  The patient tolerated the procedure without apparent complication.     Deboraha Sprang, MD, [facility]     SCK/MEDQ  D:  11/05/2015  T:  11/05/2015  Job:  [id number]

## 2015-11-06 NOTE — Discharge Summary (Signed)
ELECTROPHYSIOLOGY PROCEDURE DISCHARGE SUMMARY    Patient ID: Laurie [name],  MRN: [id number], DOB/AGE: December 05, 1945 70 y.o.  Admit date: 11/05/2015 Discharge date: 11/06/2015  Primary Care Physician: Nyoka Cowden, MD Primary Cardiologist: Wynonia Lawman Electrophysiologist: [name]  Primary Discharge Diagnosis:  Ischemic cardiomyopathy and congestive heart failure s/p upgrade to CRTD this admission  Secondary Discharge Diagnosis:  1.  Complete heart block s/p PPM 2.  CAD s/p CABG 3.  Diabetes 4.  Peripheral neuropathy 5.  Hypertension 6.  Hyperlipidemia 7.  Obesity  Allergies  Allergen Reactions  . Statins     Intolerance to several  . Tricor [Fenofibrate]     Leg pain  . Latex     Area gets red and itchy     Procedures This Admission:  1.  Upgrade of a previously implanted dual chamber pacemaker to a STJ CRTD on 11/05/15 by Dr [name].  The patient received a STJ model number Assura CRTD with model number [id number] right ventricular lead, and [id number] left ventricular lead.  DFT's were deferred at time of implant.  There were no immediate post procedure complications. 2.  CXR on 11/06/15 demonstrated no pneumothorax status post device implantation.   Brief HPI: Laurie [name] is a 70 y.o. female with a past medical history as outlined above. She has had worsening congestive heart failure with chronic RV pacing.  Risks, benefits, and alternatives to CRTD upgrade were reviewed with the patient who wished to proceed.   Hospital Course:  The patient was admitted and underwent implantation of a STJ CRTD with details as outlined above. She was monitored on telemetry overnight which demonstrated sinus rhythm with ventricular pacing.  Left chest was without hematoma or ecchymosis.  The device was interrogated and found to be functioning normally.  CXR was obtained and demonstrated no pneumothorax status post device implantation.  Wound care, arm mobility, and restrictions were  reviewed with the patient.  The patient was examined and considered stable for discharge to home.   The patient's discharge medications include an ACE-I (Lisinopril) and beta blocker (Metoprolol).   Physical Exam: Filed Vitals:   11/05/15 1625 11/05/15 1700 11/05/15 2039 11/06/15 0617  BP: 114/51 130/58 119/61 121/58  Pulse:  80 80 72  Temp:  98.4 F (36.9 C) 99.3 F (37.4 C) 97.3 F (36.3 C)  TempSrc:  Oral Oral Oral  Resp: 18 17 18 18   Height:      Weight:      SpO2:  96% 96% 96%    GEN- The patient is elderly appearing, alert and oriented x 3 today.   HEENT: normocephalic, atraumatic; sclera clear, conjunctiva pink; hearing intact; oropharynx clear; neck supple Lungs- Clear to ausculation bilaterally, normal work of breathing.  No wheezes, rales, rhonchi Heart- Regular rate and rhythm (paced) GI- soft, non-tender, non-distended, bowel sounds present  Extremities- no clubbing, cyanosis, or edema; DP/PT/radial pulses 2+ bilaterally MS- no significant deformity or atrophy Skin- warm and dry, no rash or lesion, left chest without hematoma/ecchymosis Psych- euthymic mood, full affect Neuro- strength and sensation are intact   Labs:   Lab Results  Component Value Date   WBC 5.5 11/05/2015   HGB 13.1 11/05/2015   HCT 39.7 11/05/2015   MCV 91.5 11/05/2015   PLT 233 11/05/2015     Recent Labs Lab 11/05/15 0715  NA 141  K 4.1  CL 102  CO2 26  BUN 35*  CREATININE 1.06*  CALCIUM 9.5  GLUCOSE 122*    Discharge  Medications:    Medication List    TAKE these medications        ACCU-CHEK SMARTVIEW CONTROL Liqd  Use to calibrate blood glucose meter.     ACCU-CHEK SMARTVIEW test strip  Generic drug:  glucose blood  USE  TO CHECK BLOOD SUGAR FOUR TIMES DAILY     AFRIN NODRIP ORIGINAL NA  Place 1-2 sprays into the nose daily as needed (CONGESTION). Reported on 10/03/2015     AMBULATORY NON FORMULARY MEDICATION  Take 1 capsule by mouth daily. Omega Q Plus - Take 1  capsule once daily     aspirin 81 MG EC tablet  Take 1 tablet (81 mg total) by mouth daily.     b complex vitamins tablet  Take 1 tablet by mouth daily.     B-D SINGLE USE SWABS REGULAR Pads  Use 8 daily for testing blood sugar and insulin injections.     BD INSULIN SYRINGE ULTRAFINE 31G X 15/64" 1 ML Misc  Generic drug:  Insulin Syringe-Needle U-100  USE SYRINGES 10 TIMES A DAY WITH INSULIN INJECTIONS AS DIRECTED.     beta carotene w/minerals tablet  Take 1 tablet by mouth daily.     cholecalciferol 1000 units tablet  Commonly known as:  VITAMIN D  Take 2,000 Units by mouth daily.     furosemide 40 MG tablet  Commonly known as:  LASIX  Take 40-80 mg by mouth 2 (two) times daily. 80 mg in the am and 40 mg in the evening. Take an extra 40mg  at night for weight gain of 3lbs or more in one day.     lisinopril 40 MG tablet  Commonly known as:  PRINIVIL,ZESTRIL  Take 1 tablet (40 mg total) by mouth daily.     metoprolol succinate 50 MG 24 hr tablet  Commonly known as:  TOPROL-XL  Take 1 tablet (50 mg total) by mouth daily. Take with or immediately following a meal.     NOVOLIN R RELION 100 units/mL injection  Generic drug:  insulin regular  Inject into the skin 3 (three) times daily before meals. 120 at breakfast, 170 at lunch, 150 at dinner     potassium chloride SA 20 MEQ tablet  Commonly known as:  KLOR-CON M20  Take 1 tablet (20 mEq total) by mouth daily.     PROBIOTIC DAILY Caps  Take 1 capsule by mouth daily.     sodium chloride 0.65 % nasal spray  Commonly known as:  OCEAN  Place 1 spray into the nose as needed for congestion.        Disposition:  Discharge Instructions    Diet - low sodium heart healthy    Complete by:  As directed      Increase activity slowly    Complete by:  As directed           Follow-up Information    Follow up with [facility] On 11/13/2015.   Specialty:  Cardiology   Why:  at [facility] for wound check    Contact  information:   [address], [place] [phone]      Follow up with Virl Axe, MD On 02/10/2016.   Specialty:  Cardiology   Why:  at 3:15PM   Contact information:   [address] [address] [phone]       Duration of Discharge Encounter: Greater than 30 minutes including physician time.  Signed,  Chanetta Marshall, NP 11/06/2015 6:52 AM  reviewee results  Dyspnea improved this am Instructions reveiwed  Dr Judith Part notified

## 2015-11-06 NOTE — Discharge Instructions (Signed)
° ° °  Supplemental Discharge Instructions for  Pacemaker/Defibrillator Patients  Activity No heavy lifting or vigorous activity with your left/right arm for 6 to 8 weeks.  Do not raise your left/right arm above your head for one week.  Gradually raise your affected arm as drawn below.           __        11/10/15                    11/11/15                  11/12/15                 11/13/15  NO DRIVING for   1 week  ; you may begin driving on   [id number]  .  WOUND CARE - Keep the wound area clean and dry. - DO  NOT apply any creams, oils, or ointments to the wound area. - If you notice any drainage or discharge from the wound, any swelling or bruising at the site, or you develop a fever > 101? F after you are discharged home, call the office at once.  Special Instructions - You are still able to use cellular telephones; use the ear opposite the side where you have your pacemaker/defibrillator.  Avoid carrying your cellular phone near your device. - When traveling through airports, show security personnel your identification card to avoid being screened in the metal detectors.  Ask the security personnel to use the hand wand. - Avoid arc welding equipment, MRI testing (magnetic resonance imaging), TENS units (transcutaneous nerve stimulators).  Call the office for questions about other devices. - Avoid electrical appliances that are in poor condition or are not properly grounded. - Microwave ovens are safe to be near or to operate.  Additional information for defibrillator patients should your device go off: - If your device goes off ONCE and you feel fine afterward, notify the device clinic nurses. - If your device goes off ONCE and you do not feel well afterward, call 911. - If your device goes off TWICE, call 911. - If your device goes off THREE times in one day, call 911.  DO NOT DRIVE YOURSELF OR A FAMILY MEMBER WITH A DEFIBRILLATOR TO THE HOSPITAL--CALL 911.

## 2015-11-07 ENCOUNTER — Encounter (HOSPITAL_COMMUNITY): Payer: Self-pay | Admitting: Internal Medicine

## 2015-11-07 NOTE — Addendum Note (Signed)
Addended by: Freada Bergeron on: 11/07/2015 02:38 PM   Modules accepted: Orders

## 2015-11-13 ENCOUNTER — Encounter: Payer: Self-pay | Admitting: Internal Medicine

## 2015-11-13 ENCOUNTER — Ambulatory Visit (INDEPENDENT_AMBULATORY_CARE_PROVIDER_SITE_OTHER): Payer: Commercial Managed Care - HMO | Admitting: *Deleted

## 2015-11-13 DIAGNOSIS — Z95810 Presence of automatic (implantable) cardiac defibrillator: Secondary | ICD-10-CM | POA: Diagnosis not present

## 2015-11-13 DIAGNOSIS — I5022 Chronic systolic (congestive) heart failure: Secondary | ICD-10-CM | POA: Diagnosis not present

## 2015-11-13 DIAGNOSIS — I429 Cardiomyopathy, unspecified: Secondary | ICD-10-CM

## 2015-11-13 DIAGNOSIS — I428 Other cardiomyopathies: Secondary | ICD-10-CM

## 2015-11-13 LAB — CUP PACEART INCLINIC DEVICE CHECK
Brady Statistic RV Percent Paced: 99.65 %
HighPow Impedance: 55.125
Implantable Lead Implant Date: 20140210
Implantable Lead Implant Date: 20170222
Implantable Lead Location: 753858
Implantable Lead Location: 753860
Implantable Lead Model: 4298
Implantable Lead Model: 5076
Implantable Lead Model: 7122
Lead Channel Impedance Value: 575 Ohm
Lead Channel Pacing Threshold Pulse Width: 0.5 ms
Lead Channel Pacing Threshold Pulse Width: 0.5 ms
Lead Channel Sensing Intrinsic Amplitude: 12 mV
Lead Channel Sensing Intrinsic Amplitude: 3.5 mV
Lead Channel Setting Pacing Amplitude: 2 V
Lead Channel Setting Pacing Amplitude: 2 V
Lead Channel Setting Pacing Pulse Width: 0.5 ms
MDC IDC LEAD IMPLANT DT: 20140210
MDC IDC LEAD IMPLANT DT: 20170222
MDC IDC LEAD LOCATION: 753859
MDC IDC LEAD LOCATION: 753860
MDC IDC MSMT BATTERY REMAINING LONGEVITY: 84 mo
MDC IDC MSMT LEADCHNL LV IMPEDANCE VALUE: 400 Ohm
MDC IDC MSMT LEADCHNL LV PACING THRESHOLD AMPLITUDE: 0.75 V
MDC IDC MSMT LEADCHNL RA IMPEDANCE VALUE: 412.5 Ohm
MDC IDC MSMT LEADCHNL RA PACING THRESHOLD AMPLITUDE: 0.75 V
MDC IDC MSMT LEADCHNL RA PACING THRESHOLD PULSEWIDTH: 0.5 ms
MDC IDC MSMT LEADCHNL RV PACING THRESHOLD AMPLITUDE: 0.625 V
MDC IDC SESS DTM: 20170302172806
MDC IDC SET LEADCHNL LV PACING AMPLITUDE: 2 V
MDC IDC SET LEADCHNL RV PACING PULSEWIDTH: 0.5 ms
MDC IDC SET LEADCHNL RV SENSING SENSITIVITY: 0.5 mV
MDC IDC STAT BRADY RA PERCENT PACED: 0.7 %
Pulse Gen Serial Number: 7341405

## 2015-11-13 NOTE — Progress Notes (Signed)
Wound check appointment. Dermabond removed. Wound without redness or edema. Incision edges approximated, wound well healed. Normal device function. Thresholds, sensing, and impedances consistent with implant measurements. Device programmed at chronic output for RA lead (reprogrammed from 2.5V to 2.0V), RV and LV leads with auto capture for extra safety margin until 3 month visit. Histogram distribution appropriate for patient and level of activity. No mode switches or ventricular arrhythmias noted. Patient educated about wound care, arm mobility, lifting restrictions, shock plan. ROV in 3 months with SK.

## 2015-11-15 ENCOUNTER — Other Ambulatory Visit: Payer: Self-pay | Admitting: Endocrinology

## 2015-11-24 ENCOUNTER — Telehealth: Payer: Self-pay | Admitting: Internal Medicine

## 2015-11-24 NOTE — Telephone Encounter (Signed)
Spoke w/ Tia at [employer] and informed that pt received a St Jude PPM on 11/05/15 and provided her w/ the device information.

## 2015-11-24 NOTE — Telephone Encounter (Signed)
New message       Talk to someone in device

## 2015-11-28 DIAGNOSIS — E785 Hyperlipidemia, unspecified: Secondary | ICD-10-CM | POA: Diagnosis not present

## 2015-11-28 DIAGNOSIS — E668 Other obesity: Secondary | ICD-10-CM | POA: Diagnosis not present

## 2015-11-28 DIAGNOSIS — I119 Hypertensive heart disease without heart failure: Secondary | ICD-10-CM | POA: Diagnosis not present

## 2015-11-28 DIAGNOSIS — Z95810 Presence of automatic (implantable) cardiac defibrillator: Secondary | ICD-10-CM | POA: Diagnosis not present

## 2015-11-28 DIAGNOSIS — G4733 Obstructive sleep apnea (adult) (pediatric): Secondary | ICD-10-CM | POA: Diagnosis not present

## 2015-11-28 DIAGNOSIS — I2510 Atherosclerotic heart disease of native coronary artery without angina pectoris: Secondary | ICD-10-CM | POA: Diagnosis not present

## 2015-11-28 DIAGNOSIS — I5022 Chronic systolic (congestive) heart failure: Secondary | ICD-10-CM | POA: Diagnosis not present

## 2015-11-28 DIAGNOSIS — Z951 Presence of aortocoronary bypass graft: Secondary | ICD-10-CM | POA: Diagnosis not present

## 2015-11-28 DIAGNOSIS — E1121 Type 2 diabetes mellitus with diabetic nephropathy: Secondary | ICD-10-CM | POA: Diagnosis not present

## 2015-12-01 ENCOUNTER — Encounter: Payer: Commercial Managed Care - HMO | Admitting: Internal Medicine

## 2016-01-04 ENCOUNTER — Other Ambulatory Visit: Payer: Self-pay | Admitting: Endocrinology

## 2016-01-06 ENCOUNTER — Other Ambulatory Visit: Payer: Self-pay | Admitting: Internal Medicine

## 2016-01-07 NOTE — Telephone Encounter (Signed)
Can you clarify the correct way this med is to be taken by the patient. The pharmacy request 40 mg po qd prn. The snap shot say to take 80 mg every morning and 40 mg every evening and an extra 40 mg if needed. Looking back in the med history I can not see when [name] approved the snap shot way.

## 2016-01-07 NOTE — Telephone Encounter (Signed)
Please call her to confirm how she is taking this. Her hospital discharge instructions from 11/06/15 reflect what is on her snapshot. If that is how she is taking this, then ok to fill this way.  Thanks!

## 2016-02-10 ENCOUNTER — Encounter: Payer: Self-pay | Admitting: Internal Medicine

## 2016-02-10 ENCOUNTER — Ambulatory Visit (INDEPENDENT_AMBULATORY_CARE_PROVIDER_SITE_OTHER): Payer: Commercial Managed Care - HMO | Admitting: Internal Medicine

## 2016-02-10 VITALS — BP 130/72 | HR 90 | Temp 98.4°F | Ht 62.5 in | Wt 208.0 lb

## 2016-02-10 VITALS — BP 110/60 | HR 84 | Ht 62.5 in | Wt 208.6 lb

## 2016-02-10 DIAGNOSIS — I480 Paroxysmal atrial fibrillation: Secondary | ICD-10-CM

## 2016-02-10 DIAGNOSIS — I429 Cardiomyopathy, unspecified: Secondary | ICD-10-CM

## 2016-02-10 DIAGNOSIS — Z95810 Presence of automatic (implantable) cardiac defibrillator: Secondary | ICD-10-CM

## 2016-02-10 DIAGNOSIS — I10 Essential (primary) hypertension: Secondary | ICD-10-CM | POA: Diagnosis not present

## 2016-02-10 DIAGNOSIS — E669 Obesity, unspecified: Secondary | ICD-10-CM

## 2016-02-10 DIAGNOSIS — E119 Type 2 diabetes mellitus without complications: Secondary | ICD-10-CM | POA: Diagnosis not present

## 2016-02-10 DIAGNOSIS — I2510 Atherosclerotic heart disease of native coronary artery without angina pectoris: Secondary | ICD-10-CM | POA: Diagnosis not present

## 2016-02-10 DIAGNOSIS — I428 Other cardiomyopathies: Secondary | ICD-10-CM

## 2016-02-10 DIAGNOSIS — E088 Diabetes mellitus due to underlying condition with unspecified complications: Secondary | ICD-10-CM | POA: Diagnosis not present

## 2016-02-10 DIAGNOSIS — IMO0002 Reserved for concepts with insufficient information to code with codable children: Secondary | ICD-10-CM

## 2016-02-10 DIAGNOSIS — I5022 Chronic systolic (congestive) heart failure: Secondary | ICD-10-CM

## 2016-02-10 DIAGNOSIS — I442 Atrioventricular block, complete: Secondary | ICD-10-CM

## 2016-02-10 DIAGNOSIS — E0865 Diabetes mellitus due to underlying condition with hyperglycemia: Secondary | ICD-10-CM

## 2016-02-10 DIAGNOSIS — Z794 Long term (current) use of insulin: Secondary | ICD-10-CM

## 2016-02-10 DIAGNOSIS — Z950 Presence of cardiac pacemaker: Secondary | ICD-10-CM

## 2016-02-10 LAB — COMPREHENSIVE METABOLIC PANEL
ALK PHOS: 63 U/L (ref 39–117)
ALT: 24 U/L (ref 0–35)
AST: 20 U/L (ref 0–37)
Albumin: 4.5 g/dL (ref 3.5–5.2)
BILIRUBIN TOTAL: 0.3 mg/dL (ref 0.2–1.2)
BUN: 26 mg/dL — ABNORMAL HIGH (ref 6–23)
CALCIUM: 10.6 mg/dL — AB (ref 8.4–10.5)
CO2: 33 meq/L — AB (ref 19–32)
Chloride: 99 mEq/L (ref 96–112)
Creatinine, Ser: 0.98 mg/dL (ref 0.40–1.20)
GFR: 59.72 mL/min — AB (ref >60.00)
Glucose, Bld: 99 mg/dL (ref 70–99)
POTASSIUM: 4.1 meq/L (ref 3.5–5.1)
Sodium: 141 mEq/L (ref 135–145)
Total Protein: 7.2 g/dL (ref 6.0–8.3)

## 2016-02-10 LAB — HEMOGLOBIN A1C: Hgb A1c MFr Bld: 8.3 % — ABNORMAL HIGH (ref 4.6–6.5)

## 2016-02-10 NOTE — Progress Notes (Signed)
Pre visit review using our clinic review tool, if applicable. No additional management support is needed unless otherwise documented below in the visit note. 

## 2016-02-10 NOTE — Progress Notes (Signed)
Subjective:    Patient ID: Laurie [name], female    DOB: 01-12-1946, 70 y.o.   MRN: [id number]  HPI  Wt Readings from Last 3 Encounters:  02/10/16 208 lb 9.6 oz (94.62 kg)  02/10/16 208 lb (94.348 kg)  11/05/15 202 lb (91.627 kg)    Lab Results  Component Value Date   HGBA1C 8.3* 02/10/2016   70 year old patient who is seen today for her six-month follow-up.  She is followed closely by cardiology for coronary artery disease and chronic systolic heart failure.  She is status post cardiac pacemaker and scheduled to see cardiology later today She is followed by endocrine for diabetes.  She is on high-dose mealtime insulin only. She generally feels well.  Her cardiac status is stable.  No shortness of breath.  She remains on furosemide 80 mg in the morning and 40 mg in the evening.  Past Medical History  Diagnosis Date  . Bell's palsy 01/05/2010  . Multinodular goiter   . Hepatitis   . Thyromegaly     mild  . Hyperlipidemia     Intolerance to several statins   . Hypertensive heart disease without CHF   . HIstory of Bell's palsy     October 2012   . Obesity (BMI 30-39.9)   . Peripheral neuropathy ([place])   . CAD (coronary artery disease)     Cath  07/21/1999  mild ostial, 80% stenosis proximal LAD, 90% stenosis proximal Diag 1, 95% stenosis proximal OM 1, 80% stenosis proximal RCA, 95% stenosis mid RCA    CABG 07/22/99  LIMA to LAD, SVG to dx, SVG to OM, SVG to PDA  Dr. Carmin Muskrat 2012 no ischemia EF 67%   . Seasonal allergies   . Hypertension   . Pacemaker   . Cystitis   . Heart murmur   . CHF (congestive heart failure) ([place]) dx'd 10/17/2015  . OSA on CPAP     "wear mask sometimes" (11/05/2015)  . Type II diabetes mellitus ([place])   . Arthritis     "hands" (11/05/2015)     Social History   Social History  . Marital Status: Married    Spouse Name: N/A  . Number of Children: 2  . Years of Education: N/A   Occupational History  .     Social History Main Topics    . Smoking status: Never Smoker   . Smokeless tobacco: Never Used  . Alcohol Use: No  . Drug Use: No  . Sexual Activity: No   Other Topics Concern  . Not on file   Social History Narrative   Does not work outside the home    Past Surgical History  Procedure Laterality Date  . Coronary artery bypass graft  2000  . Tubal ligation    . Biopsy thyroid  05/2010    percutaneous  . Heel spur excision Left     "& clipped a tendon that went thru bottom of my foot"  . Carpal tunnel release    . Dental implants    . Temporary pacemaker insertion N/A 10/22/2012    Procedure: TEMPORARY PACEMAKER INSERTION;  Surgeon: Troy Sine, MD;  Location: [facility] CATH LAB;  Service: Cardiovascular;  Laterality: N/A;  . Left heart catheterization with coronary angiogram N/A 10/23/2012    Procedure: LEFT HEART CATHETERIZATION WITH CORONARY ANGIOGRAM;  Surgeon: Jacolyn Reedy, MD;  Location: [facility] CATH LAB;  Service: Cardiovascular;  Laterality: N/A;  . Permanent pacemaker insertion N/A 10/23/2012  Procedure: PERMANENT PACEMAKER INSERTION;  Surgeon: Deboraha Sprang, MD;  Location: [facility] CATH LAB;  Service: Cardiovascular;  Laterality: N/A;  . Lead revision N/A 10/25/2012    Procedure: LEAD REVISION;  Surgeon: Evans Lance, MD;  Location: [facility] CATH LAB;  Service: Cardiovascular;  Laterality: N/A;  . Cataract extraction w/ intraocular lens implant Left 12/16/2014  . Cataract extraction w/ intraocular lens  implant, bilateral Right 01/06/2015  . Cystectomy Right     "middle finger"  . Cardiac catheterization N/A 10/21/2015    Procedure: Left Heart Cath and Cors/Grafts Angiography;  Surgeon: Belva Crome, MD;  Location: [place] CV LAB;  Service: Cardiovascular;  Laterality: N/A;  . Bi-ventricular pacemaker upgrade  11/05/2015    "upgraded my pacemaker"  . Ep implantable device N/A 11/05/2015    Procedure: BiV Upgrade;  Surgeon: Deboraha Sprang, MD;  Location: [place] CV LAB;  Service: Cardiovascular;  Laterality:  N/A;    Family History  Problem Relation Age of Onset  . Diabetes Father   . Heart disease Father   . Diabetes Other     Siblings and 2 children  . Heart disease Other     CAD  . Diabetes Mother   . Colon cancer Neg Hx   . Colon polyps Neg Hx     Allergies  Allergen Reactions  . Statins     Intolerance to several- unknown reaction  . Tricor [Fenofibrate]     Leg pain  . Latex     Area gets red and itchy    Current Outpatient Prescriptions on File Prior to Visit  Medication Sig Dispense Refill  . ACCU-CHEK FASTCLIX LANCETS MISC USE  TO CHECK BLOOD SUGAR FOUR TIMES DAILY 408 each 2  . ACCU-CHEK SMARTVIEW test strip USE  TO CHECK BLOOD SUGAR FOUR TIMES DAILY 400 each 1  . Alcohol Swabs (B-D SINGLE USE SWABS REGULAR) PADS Use 8 daily for testing blood sugar and insulin injections. 800 each 1  . AMBULATORY NON FORMULARY MEDICATION Take 1 capsule by mouth daily. Omega Q Plus - Take 1 capsule once daily    . aspirin EC 81 MG EC tablet Take 1 tablet (81 mg total) by mouth daily.    [name] b complex vitamins tablet Take 1 tablet by mouth daily.    . beta carotene w/minerals (OCUVITE) tablet Take 1 tablet by mouth daily.    . Blood Glucose Calibration (ACCU-CHEK SMARTVIEW CONTROL) LIQD Use to calibrate blood glucose meter. 3 each 1  . cholecalciferol (VITAMIN D) 1000 UNITS tablet Take 2,000 Units by mouth daily.    . furosemide (LASIX) 40 MG tablet Take 2 tablets by mouth every morning and 1 tablet by mouth every evening. Take additional 1 tablet by mouth for wt gain of 3 lbs or more. 360 tablet 0  . lisinopril (PRINIVIL,ZESTRIL) 40 MG tablet Take 1 tablet (40 mg total) by mouth daily. 90 tablet 1  . metoprolol succinate (TOPROL-XL) 50 MG 24 hr tablet Take 1 tablet (50 mg total) by mouth daily. Take with or immediately following a meal. 30 tablet 12  . NOVOLIN R RELION 100 UNIT/ML injection Inject into the skin 3 (three) times daily before meals. 120 at breakfast, 170 at lunch, 150 at dinner     . potassium chloride SA (KLOR-CON M20) 20 MEQ tablet Take 1 tablet (20 mEq total) by mouth daily. 180 tablet 2  . Probiotic Product (PROBIOTIC DAILY) CAPS Take 1 capsule by mouth daily.    [name]  sodium chloride (OCEAN) 0.65 % nasal spray Place 1 spray into the nose as needed for congestion.     No current facility-administered medications on file prior to visit.    BP 130/72 mmHg  Pulse 90  Temp(Src) 98.4 F (36.9 C) (Oral)  Ht 5' 2.5" (1.588 m)  Wt 208 lb (94.348 kg)  BMI 37.41 kg/m2  SpO2 94%      Review of Systems  Constitutional: Negative.   HENT: Negative for congestion, dental problem, hearing loss, rhinorrhea, sinus pressure, sore throat and tinnitus.   Eyes: Negative for pain, discharge and visual disturbance.  Respiratory: Negative for cough and shortness of breath.   Cardiovascular: Negative for chest pain, palpitations and leg swelling.  Gastrointestinal: Negative for nausea, vomiting, abdominal pain, diarrhea, constipation, blood in stool and abdominal distention.  Genitourinary: Negative for dysuria, urgency, frequency, hematuria, flank pain, vaginal bleeding, vaginal discharge, difficulty urinating, vaginal pain and pelvic pain.  Musculoskeletal: Negative for joint swelling, arthralgias and gait problem.  Skin: Negative for rash.  Neurological: Negative for dizziness, syncope, speech difficulty, weakness, numbness and headaches.  Hematological: Negative for adenopathy.  Psychiatric/Behavioral: Negative for behavioral problems, dysphoric mood and agitation. The patient is not nervous/anxious.        Objective:   Physical Exam  Constitutional: She is oriented to person, place, and time. She appears well-developed and well-nourished.  HENT:  Head: Normocephalic.  Right Ear: External ear normal.  Left Ear: External ear normal.  Mouth/Throat: Oropharynx is clear and moist.  Eyes: Conjunctivae and EOM are normal. Pupils are equal, round, and reactive to light.    Neck: Normal range of motion. Neck supple. No JVD present. No thyromegaly present.  Cardiovascular: Normal rate, regular rhythm and intact distal pulses.  Exam reveals no gallop.   Murmur heard. Pulmonary/Chest: Effort normal and breath sounds normal.  Abdominal: Soft. Bowel sounds are normal. She exhibits no mass. There is no tenderness.  Musculoskeletal: Normal range of motion. She exhibits no edema.  Lymphadenopathy:    She has no cervical adenopathy.  Neurological: She is alert and oriented to person, place, and time.  Skin: Skin is warm and dry. No rash noted.  Psychiatric: She has a normal mood and affect. Her behavior is normal.          Assessment & Plan:   Diabetes mellitus.  Will check a hemoglobin A1c.  Follow-up endocrinology Hypertension, stable Chronic systolic heart failure, status post pacemaker.  Follow-up cardiology.  He appears euvolemic.  Will check a b met Obesity.  Weight loss encouraged

## 2016-02-10 NOTE — Progress Notes (Signed)
Patient Care Team: Marletta Lor, MD as PCP - General Jacolyn Reedy, MD as Consulting Physician (Cardiology)   HPI  Laurie [name] is a 70 y.o. female Seen in followup for pacemaker implantation 2/14 for intermittent complete heart block and syncope. she is also noted to have some degree of sinus tachycardia with her mean heart rate right at 100 beats per minute  she has been intolerant of beta blockers and is currently on calcium blocker.   She was recently hospitalized for congestive heart failure was noted to have progressive left ventricular dysfunction and acute on chronic heart failure. The issue was raised as to whether this was pacemaker-induced cardiomyopathy as her ejection fraction  had gone to 30-35% with the evolution of 100% ventricular pacing whereas with no ventricular pacing in 7/15 LV function was normal.  2/17 she underwent CRT upgrade.  She has been much improved with far improved exercise tolerance with dyspnea and no edema   She has a history of coronary artery disease with prior CABG. [place] 2/14 demonstrated patent vein graft to the diagonal OM RCA and LIMA normal left ventricular function  she underwent catheterization during that hospitalization which  demonstrated interval occlusion of the SVG--RCA. It was postulated that this might explain the development of complete heart block  Past Medical History  Diagnosis Date  . Bell's palsy 01/05/2010  . Multinodular goiter   . Hepatitis   . Thyromegaly     mild  . Hyperlipidemia     Intolerance to several statins   . Hypertensive heart disease without CHF   . HIstory of Bell's palsy     October 2012   . Obesity (BMI 30-39.9)   . Peripheral neuropathy ([place])   . CAD (coronary artery disease)     Cath  07/21/1999  mild ostial, 80% stenosis proximal LAD, 90% stenosis proximal Diag 1, 95% stenosis proximal OM 1, 80% stenosis proximal RCA, 95% stenosis mid RCA    CABG 07/22/99  LIMA to LAD, SVG to dx, SVG  to OM, SVG to PDA  Dr. Carmin Muskrat 2012 no ischemia EF 67%   . Seasonal allergies   . Hypertension   . Pacemaker   . Cystitis   . Heart murmur   . CHF (congestive heart failure) ([place]) dx'd 10/17/2015  . OSA on CPAP     "wear mask sometimes" (11/05/2015)  . Type II diabetes mellitus ([place])   . Arthritis     "hands" (11/05/2015)    Past Surgical History  Procedure Laterality Date  . Coronary artery bypass graft  2000  . Tubal ligation    . Biopsy thyroid  05/2010    percutaneous  . Heel spur excision Left     "& clipped a tendon that went thru bottom of my foot"  . Carpal tunnel release    . Dental implants    . Temporary pacemaker insertion N/A 10/22/2012    Procedure: TEMPORARY PACEMAKER INSERTION;  Surgeon: Troy Sine, MD;  Location: [facility] CATH LAB;  Service: Cardiovascular;  Laterality: N/A;  . Left heart catheterization with coronary angiogram N/A 10/23/2012    Procedure: LEFT HEART CATHETERIZATION WITH CORONARY ANGIOGRAM;  Surgeon: Jacolyn Reedy, MD;  Location: [facility] CATH LAB;  Service: Cardiovascular;  Laterality: N/A;  . Permanent pacemaker insertion N/A 10/23/2012    Procedure: PERMANENT PACEMAKER INSERTION;  Surgeon: Deboraha Sprang, MD;  Location: [facility] CATH LAB;  Service: Cardiovascular;  Laterality: N/A;  .  Lead revision N/A 10/25/2012    Procedure: LEAD REVISION;  Surgeon: Evans Lance, MD;  Location: [facility] C[facility] LAB;  Service: Cardiovascular;  Laterality: N/A;  . Cataract extraction w/ intraocular lens implant Left 12/16/2014  . Cataract extraction w/ intraocular lens  implant, bilateral Right 01/06/2015  . Cystectomy Right     "middle finger"  . Cardiac catheterization N/A 10/21/2015    Procedure: Left Heart Cath and Cors/Grafts Angiography;  Surgeon: Belva Crome, MD;  Location: [place] CV LAB;  Service: Cardiovascular;  Laterality: N/A;  . Bi-ventricular pacemaker upgrade  11/05/2015    "upgraded my pacemaker"  . Ep implantable device N/A 11/05/2015    Procedure: BiV  Upgrade;  Surgeon: Deboraha Sprang, MD;  Location: [place] CV LAB;  Service: Cardiovascular;  Laterality: N/A;    Current Outpatient Prescriptions  Medication Sig Dispense Refill  . ACCU-CHEK FASTCLIX LANCETS MISC USE  TO CHECK BLOOD SUGAR FOUR TIMES DAILY 408 each 2  . ACCU-CHEK SMARTVIEW test strip USE  TO CHECK BLOOD SUGAR FOUR TIMES DAILY 400 each 1  . Alcohol Swabs (B-D SINGLE USE SWABS REGULAR) PADS Use 8 daily for testing blood sugar and insulin injections. 800 each 1  . AMBULATORY NON FORMULARY MEDICATION Take 1 capsule by mouth daily. Omega Q Plus - Take 1 capsule once daily    . aspirin EC 81 MG EC tablet Take 1 tablet (81 mg total) by mouth daily.    [name] b complex vitamins tablet Take 1 tablet by mouth daily.    . beta carotene w/minerals (OCUVITE) tablet Take 1 tablet by mouth daily.    . Blood Glucose Calibration (ACCU-CHEK SMARTVIEW CONTROL) LIQD Use to calibrate blood glucose meter. 3 each 1  . cholecalciferol (VITAMIN D) 1000 UNITS tablet Take 2,000 Units by mouth daily.    . furosemide (LASIX) 40 MG tablet Take 2 tablets by mouth every morning and 1 tablet by mouth every evening. Take additional 1 tablet by mouth for wt gain of 3 lbs or more. 360 tablet 0  . lisinopril (PRINIVIL,ZESTRIL) 40 MG tablet Take 1 tablet (40 mg total) by mouth daily. 90 tablet 1  . metoprolol succinate (TOPROL-XL) 50 MG 24 hr tablet Take 1 tablet (50 mg total) by mouth daily. Take with or immediately following a meal. 30 tablet 12  . NOVOLIN R RELION 100 UNIT/ML injection Inject into the skin 3 (three) times daily before meals. 120 at breakfast, 170 at lunch, 150 at dinner    . potassium chloride SA (KLOR-CON M20) 20 MEQ tablet Take 1 tablet (20 mEq total) by mouth daily. 180 tablet 2  . Probiotic Product (PROBIOTIC DAILY) CAPS Take 1 capsule by mouth daily.    . sodium chloride (OCEAN) 0.65 % nasal spray Place 1 spray into the nose as needed for congestion.     No current facility-administered  medications for this visit.    Allergies  Allergen Reactions  . Statins     Intolerance to several- unknown reaction  . Tricor [Fenofibrate]     Leg pain  . Latex     Area gets red and itchy    Review of Systems negative except from HPI and PMH  Physical Exam BP 110/60 mmHg  Pulse 84  Ht 5' 2.5" (1.588 m)  Wt 208 lb 9.6 oz (94.62 kg)  BMI 37.52 kg/m2 Well developed and well nourished in no acute distress HENT asymmetric smile with right labial fold flattening E scleral and icterus clear Neck Supple  JVP flat; carotids brisk and full Clear to ausculation  Device pocket well healed; without hematoma or erythema.  There is no tethering  NO COLLATERALS Regular rate and rhythm, no murmurs gallops or rub Soft with active bowel sounds No clubbing cyanosis Trace Edema Alert and oriented, grossly normal motor and sensory function Skin Warm and Dry  ECG sinus at 84 Intervals 14/15/44 Biventricular P synchronous pacing up right QRS V1 negative QRS in lead 1  Assessment and  Plan Complete heart block permanent  CRT-D The patient's device was interrogated.  The information was reviewed. No changes were made in the programming.   Hypertension  Well controlled   Coronary artery disease status post CABG previously normal LV function  .Without symptoms of ischemia  Cardiomyopathy ? Mechanism  Ischemic vs pacemaker induced  The patient was significantly improved following CRT. I will defer to Dr. Wynonia Lawman a repeat echocardiogram. We will continue her on her current medications. I have asked her to discuss with him the potential role of Entresto although would be reasonable to place until repeat LVEF assessment has been accomplished.

## 2016-02-10 NOTE — Patient Instructions (Signed)
Limit your sodium (Salt) intake   Please check your hemoglobin A1c every 3 months  Follow-up endocrine and cardiology as scheduled  Return in 6 months for follow-up

## 2016-02-10 NOTE — Patient Instructions (Signed)
Medication Instructions: - Your physician recommends that you continue on your current medications as directed. Please refer to the Current Medication list given to you today.  Labwork: - none  Procedures/Testing: - none  Follow-Up: - Remote monitoring is used to monitor your Pacemaker of ICD from home. This monitoring reduces the number of office visits required to check your device to one time per year. It allows [place] to keep an eye on the functioning of your device to ensure it is working properly. You are scheduled for a device check from home on 05/11/16. You may send your transmission at any time that day. If you have a wireless device, the transmission will be sent automatically. After your physician reviews your transmission, you will receive a postcard with your next transmission date.  - Your physician wants you to follow-up in: 9 months with Dr. [name]. You will receive a reminder letter in the mail two months in advance. If you don't receive a letter, please call our office to schedule the follow-up appointment.  Any Additional Special Instructions Will Be Listed Below (If Applicable).     If you need a refill on your cardiac medications before your next appointment, please call your pharmacy.

## 2016-02-27 DIAGNOSIS — Z95810 Presence of automatic (implantable) cardiac defibrillator: Secondary | ICD-10-CM | POA: Diagnosis not present

## 2016-02-27 DIAGNOSIS — G4733 Obstructive sleep apnea (adult) (pediatric): Secondary | ICD-10-CM | POA: Diagnosis not present

## 2016-02-27 DIAGNOSIS — Z951 Presence of aortocoronary bypass graft: Secondary | ICD-10-CM | POA: Diagnosis not present

## 2016-02-27 DIAGNOSIS — I5022 Chronic systolic (congestive) heart failure: Secondary | ICD-10-CM | POA: Diagnosis not present

## 2016-02-27 DIAGNOSIS — E785 Hyperlipidemia, unspecified: Secondary | ICD-10-CM | POA: Diagnosis not present

## 2016-02-27 DIAGNOSIS — I2510 Atherosclerotic heart disease of native coronary artery without angina pectoris: Secondary | ICD-10-CM | POA: Diagnosis not present

## 2016-02-27 DIAGNOSIS — E668 Other obesity: Secondary | ICD-10-CM | POA: Diagnosis not present

## 2016-02-27 DIAGNOSIS — E1121 Type 2 diabetes mellitus with diabetic nephropathy: Secondary | ICD-10-CM | POA: Diagnosis not present

## 2016-02-27 DIAGNOSIS — I119 Hypertensive heart disease without heart failure: Secondary | ICD-10-CM | POA: Diagnosis not present

## 2016-02-28 ENCOUNTER — Other Ambulatory Visit: Payer: Self-pay | Admitting: Endocrinology

## 2016-03-03 ENCOUNTER — Other Ambulatory Visit: Payer: Self-pay

## 2016-03-03 MED ORDER — LISINOPRIL 40 MG PO TABS: 40.0000 mg | ORAL_TABLET | Freq: Every day | ORAL | Status: DC

## 2016-03-04 ENCOUNTER — Encounter: Payer: Self-pay | Admitting: Internal Medicine

## 2016-03-12 ENCOUNTER — Other Ambulatory Visit: Payer: Self-pay | Admitting: *Deleted

## 2016-03-12 MED ORDER — FUROSEMIDE 40 MG PO TABS: ORAL_TABLET | ORAL | Status: DC

## 2016-03-22 DIAGNOSIS — Z95810 Presence of automatic (implantable) cardiac defibrillator: Secondary | ICD-10-CM | POA: Diagnosis not present

## 2016-03-22 DIAGNOSIS — I2510 Atherosclerotic heart disease of native coronary artery without angina pectoris: Secondary | ICD-10-CM | POA: Diagnosis not present

## 2016-03-22 DIAGNOSIS — E785 Hyperlipidemia, unspecified: Secondary | ICD-10-CM | POA: Diagnosis not present

## 2016-03-22 DIAGNOSIS — G4733 Obstructive sleep apnea (adult) (pediatric): Secondary | ICD-10-CM | POA: Diagnosis not present

## 2016-03-22 DIAGNOSIS — E1121 Type 2 diabetes mellitus with diabetic nephropathy: Secondary | ICD-10-CM | POA: Diagnosis not present

## 2016-03-22 DIAGNOSIS — Z951 Presence of aortocoronary bypass graft: Secondary | ICD-10-CM | POA: Diagnosis not present

## 2016-03-22 DIAGNOSIS — I5022 Chronic systolic (congestive) heart failure: Secondary | ICD-10-CM | POA: Diagnosis not present

## 2016-03-22 DIAGNOSIS — I119 Hypertensive heart disease without heart failure: Secondary | ICD-10-CM | POA: Diagnosis not present

## 2016-03-22 DIAGNOSIS — E668 Other obesity: Secondary | ICD-10-CM | POA: Diagnosis not present

## 2016-04-02 ENCOUNTER — Encounter: Payer: Self-pay | Admitting: Internal Medicine

## 2016-04-28 ENCOUNTER — Other Ambulatory Visit: Payer: Self-pay | Admitting: Endocrinology

## 2016-04-28 NOTE — Telephone Encounter (Signed)
Please refill x 3 mos Ov is due 

## 2016-04-29 ENCOUNTER — Other Ambulatory Visit: Payer: Self-pay

## 2016-04-29 MED ORDER — GLUCOSE BLOOD VI STRP: ORAL_STRIP | 0 refills | Status: DC

## 2016-05-07 LAB — CUP PACEART INCLINIC DEVICE CHECK
Battery Remaining Longevity: 80.4
Brady Statistic RA Percent Paced: 1.3 %
HIGH POWER IMPEDANCE MEASURED VALUE: 69.75 Ohm
Implantable Lead Implant Date: 20170222
Implantable Lead Location: 753859
Implantable Lead Location: 753860
Implantable Lead Model: 4298
Implantable Lead Model: 5076
Lead Channel Impedance Value: 400 Ohm
Lead Channel Impedance Value: 587.5 Ohm
Lead Channel Pacing Threshold Amplitude: 0.5 V
Lead Channel Pacing Threshold Amplitude: 0.5 V
Lead Channel Pacing Threshold Amplitude: 1 V
Lead Channel Pacing Threshold Amplitude: 1 V
Lead Channel Pacing Threshold Pulse Width: 0.5 ms
Lead Channel Pacing Threshold Pulse Width: 0.5 ms
Lead Channel Pacing Threshold Pulse Width: 0.5 ms
Lead Channel Pacing Threshold Pulse Width: 0.5 ms
Lead Channel Sensing Intrinsic Amplitude: 12 mV
Lead Channel Sensing Intrinsic Amplitude: 3.9 mV
Lead Channel Setting Pacing Amplitude: 2 V
Lead Channel Setting Pacing Pulse Width: 0.5 ms
Lead Channel Setting Pacing Pulse Width: 0.5 ms
Lead Channel Setting Sensing Sensitivity: 0.5 mV
MDC IDC LEAD IMPLANT DT: 20140210
MDC IDC LEAD IMPLANT DT: 20170222
MDC IDC LEAD LOCATION: 753858
MDC IDC LEAD MODEL: 7122
MDC IDC MSMT LEADCHNL RA IMPEDANCE VALUE: 400 Ohm
MDC IDC MSMT LEADCHNL RA PACING THRESHOLD AMPLITUDE: 0.5 V
MDC IDC MSMT LEADCHNL RA PACING THRESHOLD PULSEWIDTH: 0.5 ms
MDC IDC MSMT LEADCHNL RV PACING THRESHOLD AMPLITUDE: 0.5 V
MDC IDC MSMT LEADCHNL RV PACING THRESHOLD PULSEWIDTH: 0.5 ms
MDC IDC PG SERIAL: 7341405
MDC IDC SESS DTM: 20170530195932
MDC IDC SET LEADCHNL LV PACING AMPLITUDE: 2 V
MDC IDC SET LEADCHNL RA PACING AMPLITUDE: 2 V
MDC IDC STAT BRADY RV PERCENT PACED: 99.78 %

## 2016-05-11 ENCOUNTER — Ambulatory Visit (INDEPENDENT_AMBULATORY_CARE_PROVIDER_SITE_OTHER): Payer: Commercial Managed Care - HMO | Admitting: *Deleted

## 2016-05-11 DIAGNOSIS — I428 Other cardiomyopathies: Secondary | ICD-10-CM

## 2016-05-11 DIAGNOSIS — I429 Cardiomyopathy, unspecified: Secondary | ICD-10-CM | POA: Diagnosis not present

## 2016-05-11 DIAGNOSIS — Z95810 Presence of automatic (implantable) cardiac defibrillator: Secondary | ICD-10-CM

## 2016-05-12 NOTE — Progress Notes (Signed)
Remote ICD transmission.   

## 2016-05-13 ENCOUNTER — Encounter: Payer: Self-pay | Admitting: Cardiology

## 2016-05-13 ENCOUNTER — Other Ambulatory Visit: Payer: Self-pay | Admitting: Endocrinology

## 2016-05-13 NOTE — Telephone Encounter (Signed)
Please refill x 1 Ov is due  

## 2016-05-20 DIAGNOSIS — H52223 Regular astigmatism, bilateral: Secondary | ICD-10-CM | POA: Diagnosis not present

## 2016-05-20 DIAGNOSIS — Z0100 Encounter for examination of eyes and vision without abnormal findings: Secondary | ICD-10-CM | POA: Diagnosis not present

## 2016-05-20 DIAGNOSIS — H5210 Myopia, unspecified eye: Secondary | ICD-10-CM | POA: Diagnosis not present

## 2016-05-20 DIAGNOSIS — H5203 Hypermetropia, bilateral: Secondary | ICD-10-CM | POA: Diagnosis not present

## 2016-05-20 DIAGNOSIS — E119 Type 2 diabetes mellitus without complications: Secondary | ICD-10-CM | POA: Diagnosis not present

## 2016-05-20 LAB — HM DIABETES EYE EXAM

## 2016-05-26 ENCOUNTER — Ambulatory Visit (INDEPENDENT_AMBULATORY_CARE_PROVIDER_SITE_OTHER): Payer: Commercial Managed Care - HMO | Admitting: Endocrinology

## 2016-05-26 ENCOUNTER — Encounter: Payer: Self-pay | Admitting: Endocrinology

## 2016-05-26 VITALS — BP 132/82 | HR 102 | Ht 62.5 in | Wt 208.0 lb

## 2016-05-26 DIAGNOSIS — E088 Diabetes mellitus due to underlying condition with unspecified complications: Secondary | ICD-10-CM | POA: Diagnosis not present

## 2016-05-26 DIAGNOSIS — IMO0002 Reserved for concepts with insufficient information to code with codable children: Secondary | ICD-10-CM

## 2016-05-26 DIAGNOSIS — E0865 Diabetes mellitus due to underlying condition with hyperglycemia: Secondary | ICD-10-CM

## 2016-05-26 DIAGNOSIS — E042 Nontoxic multinodular goiter: Secondary | ICD-10-CM

## 2016-05-26 DIAGNOSIS — Z794 Long term (current) use of insulin: Secondary | ICD-10-CM

## 2016-05-26 DIAGNOSIS — Z23 Encounter for immunization: Secondary | ICD-10-CM | POA: Diagnosis not present

## 2016-05-26 LAB — POCT GLYCOSYLATED HEMOGLOBIN (HGB A1C): Hemoglobin A1C: 7.3

## 2016-05-26 NOTE — Progress Notes (Signed)
Subjective:    Patient ID: Laurie [name], female    DOB: Nov 10, 1945, 70 y.o.   MRN: [id number]  HPI Pt returns for f/u of diabetes mellitus:   DM type: insulin-requiring type 2 Dx'ed: [id number] Complications: peripheral sensory neuropathy and CAD.   Therapy: insulin since 2011. GDM: 1985 DKA: never Severe hypoglycemia: never.   Pancreatitis: never.   Other: she has severe insulin resistance; she required very little insulin during a hospitalization in early 2014; she wants to delay weight-loss surgery for now; she takes human insulin, due to cost.   Interval history:  pt states she feels well in general.  she brings a record of her cbg's which i have reviewed today.  It varies from 92-300's.  It is in general higher as the day goes on, but she does not check at HS.   Pt also returns for f/u of multinodular goiter (dx'ed 2011; bx then showed FOLLICULAR LESION;COMMENT: THE FINDINGS FAVOR A HYPERPLASTIC LESION; she has been euthyroid).  She does not notice the goiter. Past Medical History:  Diagnosis Date  . Arthritis    "hands" (11/05/2015)  . Bell's palsy 01/05/2010  . CAD (coronary artery disease)    Cath  07/21/1999  mild ostial, 80% stenosis proximal LAD, 90% stenosis proximal Diag 1, 95% stenosis proximal OM 1, 80% stenosis proximal RCA, 95% stenosis mid RCA    CABG 07/22/99  LIMA to LAD, SVG to dx, SVG to OM, SVG to PDA  Dr. Carmin Muskrat 2012 no ischemia EF 67%   . CHF (congestive heart failure) ([place]) dx'd 10/17/2015  . Cystitis   . Heart murmur   . Hepatitis   . HIstory of Bell's palsy    October 2012   . Hyperlipidemia    Intolerance to several statins   . Hypertension   . Hypertensive heart disease without CHF   . Multinodular goiter   . Obesity (BMI 30-39.9)   . OSA on CPAP    "wear mask sometimes" (11/05/2015)  . Pacemaker   . Peripheral neuropathy ([place])   . Seasonal allergies   . Thyromegaly    mild  . Type II diabetes mellitus ([place])     Past Surgical History:   Procedure Laterality Date  . BI-VENTRICULAR PACEMAKER UPGRADE  11/05/2015   "upgraded my pacemaker"  . BIOPSY THYROID  05/2010   percutaneous  . CARDIAC CATHETERIZATION N/A 10/21/2015   Procedure: Left Heart Cath and Cors/Grafts Angiography;  Surgeon: Belva Crome, MD;  Location: [place] CV LAB;  Service: Cardiovascular;  Laterality: N/A;  . CARPAL TUNNEL RELEASE    . CATARACT EXTRACTION W/ INTRAOCULAR LENS  IMPLANT, BILATERAL Right 01/06/2015  . CATARACT EXTRACTION W/ INTRAOCULAR LENS IMPLANT Left 12/16/2014  . CORONARY ARTERY BYPASS GRAFT  2000  . CYSTECTOMY Right    "middle finger"  . DENTAL IMPLANTS    . EP IMPLANTABLE DEVICE N/A 11/05/2015   Procedure: BiV Upgrade;  Surgeon: Deboraha Sprang, MD;  Location: [place] CV LAB;  Service: Cardiovascular;  Laterality: N/A;  . HEEL SPUR EXCISION Left    "& clipped a tendon that went thru bottom of my foot"  . LEAD REVISION N/A 10/25/2012   Procedure: LEAD REVISION;  Surgeon: Evans Lance, MD;  Location: [facility] CATH LAB;  Service: Cardiovascular;  Laterality: N/A;  . LEFT HEART CATHETERIZATION WITH CORONARY ANGIOGRAM N/A 10/23/2012   Procedure: LEFT HEART CATHETERIZATION WITH CORONARY ANGIOGRAM;  Surgeon: Jacolyn Reedy, MD;  Location: [facility] CATH LAB;  Service: Cardiovascular;  Laterality: N/A;  . PERMANENT PACEMAKER INSERTION N/A 10/23/2012   Procedure: PERMANENT PACEMAKER INSERTION;  Surgeon: Deboraha Sprang, MD;  Location: [facility] CATH LAB;  Service: Cardiovascular;  Laterality: N/A;  . TEMPORARY PACEMAKER INSERTION N/A 10/22/2012   Procedure: TEMPORARY PACEMAKER INSERTION;  Surgeon: Troy Sine, MD;  Location: [facility] CATH LAB;  Service: Cardiovascular;  Laterality: N/A;  . TUBAL LIGATION      Social History   Social History  . Marital status: Married    Spouse name: N/A  . Number of children: 2  . Years of education: N/A   Occupational History  .  Retired   Social History Main Topics  . Smoking status: Never Smoker  . Smokeless tobacco:  Never Used  . Alcohol use No  . Drug use: No  . Sexual activity: No   Other Topics Concern  . Not on file   Social History Narrative   Does not work outside the home    Current Outpatient Prescriptions on File Prior to Visit  Medication Sig Dispense Refill  . ACCU-CHEK FASTCLIX LANCETS MISC USE  TO CHECK BLOOD SUGAR FOUR TIMES DAILY 408 each 2  . ACCU-CHEK SMARTVIEW test strip USE  TO CHECK BLOOD SUGAR FOUR TIMES DAILY 400 each 1  . Alcohol Swabs (B-D SINGLE USE SWABS REGULAR) PADS Use 8 daily for testing blood sugar and insulin injections. 800 each 1  . AMBULATORY NON FORMULARY MEDICATION Take 1 capsule by mouth daily. Omega Q Plus - Take 1 capsule once daily    . aspirin EC 81 MG EC tablet Take 1 tablet (81 mg total) by mouth daily.    [name] b complex vitamins tablet Take 1 tablet by mouth daily.    . BD INSULIN SYRINGE ULTRAFINE 31G X 15/64" 1 ML MISC USE SYRINGES 10 TIMES A DAY WITH INSULIN AS DIRECTED. 300 each 0  . beta carotene w/minerals (OCUVITE) tablet Take 1 tablet by mouth daily.    . Blood Glucose Calibration (ACCU-CHEK SMARTVIEW CONTROL) LIQD Use to calibrate blood glucose meter. 3 each 1  . cholecalciferol (VITAMIN D) 1000 UNITS tablet Take 2,000 Units by mouth daily.    . furosemide (LASIX) 40 MG tablet Take 2 tablets by mouth every morning and 1 tablet by mouth every evening. Take additional 1 tablet by mouth for wt gain of 3 lbs or more. 360 tablet 3  . lisinopril (PRINIVIL,ZESTRIL) 40 MG tablet Take 1 tablet (40 mg total) by mouth daily. (Patient taking differently: Take 20 mg by mouth daily. ) 90 tablet 1  . metoprolol succinate (TOPROL-XL) 50 MG 24 hr tablet Take 1 tablet (50 mg total) by mouth daily. Take with or immediately following a meal. 30 tablet 12  . NOVOLIN R RELION 100 UNIT/ML injection Inject into the skin 3 (three) times daily before meals. 120 at breakfast, 170 at lunch, 150 at dinner    . potassium chloride SA (KLOR-CON M20) 20 MEQ tablet Take 1 tablet (20  mEq total) by mouth daily. 180 tablet 2  . Probiotic Product (PROBIOTIC DAILY) CAPS Take 1 capsule by mouth daily.    . sodium chloride (OCEAN) 0.65 % nasal spray Place 1 spray into the nose as needed for congestion.     No current facility-administered medications on file prior to visit.     Allergies  Allergen Reactions  . Statins     Intolerance to several- unknown reaction  . Tricor [Fenofibrate]     Leg pain  .  Latex     Area gets red and itchy    Family History  Problem Relation Age of Onset  . Diabetes Father   . Heart disease Father   . Diabetes Other     Siblings and 2 children  . Heart disease Other     CAD  . Diabetes Mother   . Colon cancer Neg Hx   . Colon polyps Neg Hx     BP 132/82   Pulse (!) 102   Ht 5' 2.5" (1.588 m)   Wt 208 lb (94.3 kg)   SpO2 95%   BMI 37.44 kg/m   Review of Systems She denies hypoglycemia    Objective:   Physical Exam VITAL SIGNS:  See vs page.  GENERAL: no distress NECK: I can't tell details, but she seems to have a nodular goiter, L>R Pulses: dorsalis pedis intact bilat.  Skin: no ulcer on the feet. feet are of normal color and temp. There is a healed vein harvest scar at the right leg.  Feet: no deformity.  CV: trace bilat leg edema.  Neuro: sensation is intact to touch on the feet,but decreased from normal.    A1c=7.3%    Assessment & Plan:  cardiomyopathy: I hesitate to increase now, due to concern about hypoglycemia.   Noncompliance with cbg recording at hs: because of this, we can't increase insulin now.  Insulin-requiring type 2 DM: she needs increased rx, if it can be done with a regimen that avoids or minimizes hypoglycemia.  Multinodular goiter: due for recheck

## 2016-05-26 NOTE — Patient Instructions (Addendum)
check your blood sugar 4 times a day--before the 3 meals, and at bedtime (the bedtime reading is important, as we may need to increase the suppertime insulin).  also check if you have symptoms of your blood sugar being too high or too low.  please keep a record of the readings and bring it to your next appointment here.  please call us sooner if you are having low blood sugar episodes.   Please come back for a follow-up appointment in 4 months.    Please continue the same insulin for now. Let's recheck the ultrasound.  you will receive a phone call, about a day and time for an appointment.

## 2016-06-01 LAB — CUP PACEART REMOTE DEVICE CHECK
Battery Remaining Percentage: 92 %
Brady Statistic AP VP Percent: 1 %
Brady Statistic AS VP Percent: 99 %
Brady Statistic RA Percent Paced: 1 %
Date Time Interrogation Session: 20170829060016
HIGH POWER IMPEDANCE MEASURED VALUE: 71 Ohm
HighPow Impedance: 71 Ohm
Implantable Lead Implant Date: 20140210
Implantable Lead Implant Date: 20170222
Implantable Lead Location: 753858
Implantable Lead Location: 753859
Implantable Lead Model: 4298
Implantable Lead Model: 5076
Implantable Lead Model: 7122
Lead Channel Impedance Value: 410 Ohm
Lead Channel Impedance Value: 610 Ohm
Lead Channel Pacing Threshold Amplitude: 0.5 V
Lead Channel Pacing Threshold Pulse Width: 0.5 ms
Lead Channel Sensing Intrinsic Amplitude: 12 mV
Lead Channel Setting Pacing Amplitude: 2 V
Lead Channel Setting Pacing Amplitude: 2 V
Lead Channel Setting Pacing Pulse Width: 0.5 ms
MDC IDC LEAD IMPLANT DT: 20170222
MDC IDC LEAD LOCATION: 753860
MDC IDC MSMT BATTERY REMAINING LONGEVITY: 79 mo
MDC IDC MSMT BATTERY VOLTAGE: 3.16 V
MDC IDC MSMT LEADCHNL LV PACING THRESHOLD AMPLITUDE: 0.875 V
MDC IDC MSMT LEADCHNL LV PACING THRESHOLD PULSEWIDTH: 0.5 ms
MDC IDC MSMT LEADCHNL RA IMPEDANCE VALUE: 390 Ohm
MDC IDC MSMT LEADCHNL RA SENSING INTR AMPL: 4 mV
MDC IDC MSMT LEADCHNL RV PACING THRESHOLD AMPLITUDE: 0.5 V
MDC IDC MSMT LEADCHNL RV PACING THRESHOLD PULSEWIDTH: 0.5 ms
MDC IDC SET LEADCHNL RV PACING AMPLITUDE: 2 V
MDC IDC SET LEADCHNL RV PACING PULSEWIDTH: 0.5 ms
MDC IDC SET LEADCHNL RV SENSING SENSITIVITY: 0.5 mV
MDC IDC STAT BRADY AP VS PERCENT: 1 %
MDC IDC STAT BRADY AS VS PERCENT: 1 %
Pulse Gen Serial Number: 7341405

## 2016-06-02 ENCOUNTER — Ambulatory Visit
Admission: RE | Admit: 2016-06-02 | Discharge: 2016-06-02 | Disposition: A | Discharge status: Home / Self Care | Payer: Commercial Managed Care - HMO | Source: Ambulatory Visit | Attending: Endocrinology | Admitting: Endocrinology

## 2016-06-02 DIAGNOSIS — E042 Nontoxic multinodular goiter: Secondary | ICD-10-CM | POA: Diagnosis not present

## 2016-06-09 ENCOUNTER — Encounter: Payer: Self-pay | Admitting: Internal Medicine

## 2016-06-10 ENCOUNTER — Other Ambulatory Visit: Payer: Self-pay | Admitting: Internal Medicine

## 2016-06-22 DIAGNOSIS — I119 Hypertensive heart disease without heart failure: Secondary | ICD-10-CM | POA: Diagnosis not present

## 2016-06-22 DIAGNOSIS — I2510 Atherosclerotic heart disease of native coronary artery without angina pectoris: Secondary | ICD-10-CM | POA: Diagnosis not present

## 2016-06-22 DIAGNOSIS — Z95810 Presence of automatic (implantable) cardiac defibrillator: Secondary | ICD-10-CM | POA: Diagnosis not present

## 2016-06-22 DIAGNOSIS — G4733 Obstructive sleep apnea (adult) (pediatric): Secondary | ICD-10-CM | POA: Diagnosis not present

## 2016-06-22 DIAGNOSIS — E1121 Type 2 diabetes mellitus with diabetic nephropathy: Secondary | ICD-10-CM | POA: Diagnosis not present

## 2016-06-22 DIAGNOSIS — Z951 Presence of aortocoronary bypass graft: Secondary | ICD-10-CM | POA: Diagnosis not present

## 2016-06-22 DIAGNOSIS — E668 Other obesity: Secondary | ICD-10-CM | POA: Diagnosis not present

## 2016-06-22 DIAGNOSIS — I5022 Chronic systolic (congestive) heart failure: Secondary | ICD-10-CM | POA: Diagnosis not present

## 2016-06-22 DIAGNOSIS — E785 Hyperlipidemia, unspecified: Secondary | ICD-10-CM | POA: Diagnosis not present

## 2016-07-19 ENCOUNTER — Other Ambulatory Visit: Payer: Self-pay | Admitting: Endocrinology

## 2016-07-25 ENCOUNTER — Other Ambulatory Visit: Payer: Self-pay | Admitting: Endocrinology

## 2016-08-10 ENCOUNTER — Ambulatory Visit (INDEPENDENT_AMBULATORY_CARE_PROVIDER_SITE_OTHER): Payer: Commercial Managed Care - HMO | Admitting: *Deleted

## 2016-08-10 DIAGNOSIS — I428 Other cardiomyopathies: Secondary | ICD-10-CM | POA: Diagnosis not present

## 2016-08-11 NOTE — Progress Notes (Signed)
Remote ICD transmission.   

## 2016-08-12 ENCOUNTER — Encounter: Payer: Self-pay | Admitting: Cardiology

## 2016-08-17 ENCOUNTER — Ambulatory Visit (INDEPENDENT_AMBULATORY_CARE_PROVIDER_SITE_OTHER): Payer: Commercial Managed Care - HMO | Admitting: Internal Medicine

## 2016-08-17 ENCOUNTER — Encounter: Payer: Self-pay | Admitting: Internal Medicine

## 2016-08-17 VITALS — BP 138/76 | HR 103 | Temp 98.5°F | Ht 62.5 in | Wt 210.0 lb

## 2016-08-17 DIAGNOSIS — I428 Other cardiomyopathies: Secondary | ICD-10-CM

## 2016-08-17 DIAGNOSIS — I119 Hypertensive heart disease without heart failure: Secondary | ICD-10-CM

## 2016-08-17 DIAGNOSIS — E088 Diabetes mellitus due to underlying condition with unspecified complications: Secondary | ICD-10-CM

## 2016-08-17 DIAGNOSIS — Z794 Long term (current) use of insulin: Secondary | ICD-10-CM

## 2016-08-17 DIAGNOSIS — E0865 Diabetes mellitus due to underlying condition with hyperglycemia: Secondary | ICD-10-CM

## 2016-08-17 DIAGNOSIS — E042 Nontoxic multinodular goiter: Secondary | ICD-10-CM

## 2016-08-17 DIAGNOSIS — E782 Mixed hyperlipidemia: Secondary | ICD-10-CM

## 2016-08-17 DIAGNOSIS — Z0000 Encounter for general adult medical examination without abnormal findings: Secondary | ICD-10-CM

## 2016-08-17 DIAGNOSIS — IMO0002 Reserved for concepts with insufficient information to code with codable children: Secondary | ICD-10-CM

## 2016-08-17 DIAGNOSIS — Z951 Presence of aortocoronary bypass graft: Secondary | ICD-10-CM

## 2016-08-17 NOTE — Progress Notes (Signed)
Subjective:    Patient ID: Laurie [name], female    DOB: 05/05/1946, 70 y.o.   MRN: [id number]  HPI  70 year old patient who is seen today for a preventive health examination as well as Medicare wellness visit  Lab Results  Component Value Date   HGBA1C 7.3 05/26/2016   She is followed by Endocrinology due to diabetes as well as multinodular goiter.  She has a history of nonischemic cardiomyopathy and is followed by cardiology.  She is status post ICD for complete heart block. She underwent heart catheterization in February of this year.  Colonoscopy performed September 2016  Doing well.  Bothered somewhat by diuretic induced urinary frequency  Past Medical History:  Diagnosis Date  . Arthritis    "hands" (11/05/2015)  . Bell's palsy 01/05/2010  . CAD (coronary artery disease)    Cath  07/21/1999  mild ostial, 80% stenosis proximal LAD, 90% stenosis proximal Diag 1, 95% stenosis proximal OM 1, 80% stenosis proximal RCA, 95% stenosis mid RCA    CABG 07/22/99  LIMA to LAD, SVG to dx, SVG to OM, SVG to PDA  Dr. Carmin Muskrat 2012 no ischemia EF 67%   . CHF (congestive heart failure) ([place]) dx'd 10/17/2015  . Cystitis   . Heart murmur   . Hepatitis   . HIstory of Bell's palsy    October 2012   . Hyperlipidemia    Intolerance to several statins   . Hypertension   . Hypertensive heart disease without CHF   . Multinodular goiter   . Obesity (BMI 30-39.9)   . OSA on CPAP    "wear mask sometimes" (11/05/2015)  . Pacemaker   . Peripheral neuropathy ([place])   . Seasonal allergies   . Thyromegaly    mild  . Type II diabetes mellitus ([place])      Social History   Social History  . Marital status: Married    Spouse name: N/A  . Number of children: 2  . Years of education: N/A   Occupational History  .  Retired   Social History Main Topics  . Smoking status: Never Smoker  . Smokeless tobacco: Never Used  . Alcohol use No  . Drug use: No  . Sexual activity: No   Other  Topics Concern  . Not on file   Social History Narrative   Does not work outside the home    Past Surgical History:  Procedure Laterality Date  . BI-VENTRICULAR PACEMAKER UPGRADE  11/05/2015   "upgraded my pacemaker"  . BIOPSY THYROID  05/2010   percutaneous  . CARDIAC CATHETERIZATION N/A 10/21/2015   Procedure: Left Heart Cath and Cors/Grafts Angiography;  Surgeon: Belva Crome, MD;  Location: [facility];  Service: Cardiovascular;  Laterality: N/A;  . CARPAL TUNNEL RELEASE    . CATARACT EXTRACTION W/ INTRAOCULAR LENS  IMPLANT, BILATERAL Right 01/06/2015  . CATARACT EXTRACTION W/ INTRAOCULAR LENS IMPLANT Left 12/16/2014  . CORONARY ARTERY BYPASS GRAFT  2000  . CYSTECTOMY Right    "middle finger"  . DENTAL IMPLANTS    . EP IMPLANTABLE DEVICE N/A 11/05/2015   Procedure: BiV Upgrade;  Surgeon: Deboraha Sprang, MD;  Location: [facility];  Service: Cardiovascular;  Laterality: N/A;  . HEEL SPUR EXCISION Left    "& clipped a tendon that went thru bottom of my foot"  . LEAD REVISION N/A 10/25/2012   Procedure: LEAD REVISION;  Surgeon: Evans Lance, MD;  Location: [facility] CATH LAB;  Service: Cardiovascular;  Laterality: N/A;  . LEFT HEART CATHETERIZATION WITH CORONARY ANGIOGRAM N/A 10/23/2012   Procedure: LEFT HEART CATHETERIZATION WITH CORONARY ANGIOGRAM;  Surgeon: Jacolyn Reedy, MD;  Location[facility] LAB;  Service: Cardiovascular;  Laterality: N/A;  . PERMANENT PACEMAKER INSERTION N/A 10/23/2012   Procedure: PERMANENT PACEMAKER INSERTION;  Surgeon: Deboraha Sprang, MD;  Location: [facility] CATH LAB;  Service: Cardiovascular;  Laterality: N/A;  . TEMPORARY PACEMAKER INSERTION N/A 10/22/2012   Procedure: TEMPORARY PACEMAKER INSERTION;  Surgeon: Troy Sine, MD;  Location: [facility] CATH LAB;  Service: Cardiovascular;  Laterality: N/A;  . TUBAL LIGATION      Family History  Problem Relation Age of Onset  . Diabetes Father   . Heart disease Father   . Diabetes Other     Siblings and 2  children  . Heart disease Other     CAD  . Diabetes Mother   . Colon cancer Neg Hx   . Colon polyps Neg Hx     Allergies  Allergen Reactions  . Statins     Intolerance to several- unknown reaction  . Tricor [Fenofibrate]     Leg pain  . Latex     Area gets red and itchy    Current Outpatient Prescriptions on File Prior to Visit  Medication Sig Dispense Refill  . ACCU-CHEK FASTCLIX LANCETS MISC USE  TO CHECK BLOOD SUGAR FOUR TIMES DAILY 408 each 2  . ACCU-CHEK SMARTVIEW test strip USE  TO CHECK BLOOD SUGAR FOUR TIMES DAILY 400 each 1  . Alcohol Swabs (B-D SINGLE USE SWABS REGULAR) PADS Use 8 daily for testing blood sugar and insulin injections. 800 each 1  . AMBULATORY NON FORMULARY MEDICATION Take 1 capsule by mouth daily. Omega Q Plus - Take 1 capsule once daily    . aspirin EC 81 MG EC tablet Take 1 tablet (81 mg total) by mouth daily.    [name] b complex vitamins tablet Take 1 tablet by mouth daily.    . BD INSULIN SYRINGE ULTRAFINE 31G X 15/64" 1 ML MISC USE SYRINGES 10 TIMES A DAY WITH INSULIN AS DIRECTED 300 each 0  . beta carotene w/minerals (OCUVITE) tablet Take 1 tablet by mouth daily.    . Blood Glucose Calibration (ACCU-CHEK SMARTVIEW CONTROL) LIQD Use to calibrate blood glucose meter. 3 each 1  . cholecalciferol (VITAMIN D) 1000 UNITS tablet Take 2,000 Units by mouth daily.    [name] Fexofenadine HCl (ALLEGRA ALLERGY PO) Take by mouth.    . furosemide (LASIX) 40 MG tablet Take 2 tablets by mouth every morning and 1 tablet by mouth every evening. Take additional 1 tablet by mouth for wt gain of 3 lbs or more. 360 tablet 3  . KLOR-CON M20 20 MEQ tablet TAKE ONE TABLET BY MOUTH TWICE DAILY (Patient taking differently: TAKE ONE TABLET BY MOUTH  DAILY) 180 tablet 1  . metoprolol succinate (TOPROL-XL) 50 MG 24 hr tablet Take 1 tablet (50 mg total) by mouth daily. Take with or immediately following a meal. 30 tablet 12  . NOVOLIN R RELION 100 UNIT/ML injection Inject into the skin 3  (three) times daily before meals. 120 at breakfast, 170 at lunch, 150 at dinner    . potassium chloride SA (KLOR-CON M20) 20 MEQ tablet Take 1 tablet (20 mEq total) by mouth daily. 180 tablet 2  . Probiotic Product (PROBIOTIC DAILY) CAPS Take 1 capsule by mouth daily.    . psyllium (METAMUCIL) 58.6 % powder Take 1 packet  by mouth 3 (three) times daily.    . sodium chloride (OCEAN) 0.65 % nasal spray Place 1 spray into the nose as needed for congestion.     No current facility-administered medications on file prior to visit.     BP 138/76 (BP Location: Left Arm, Patient Position: Sitting, Cuff Size: Large)   Pulse (!) 103   Temp 98.5 F (36.9 C) (Oral)   Ht 5' 2.5" (1.588 m)   Wt 210 lb (95.3 kg)   SpO2 96%   BMI 37.80 kg/m   Medicare wellness visit:  1. Risk factors, based on past  M,S,F history.  Patient has known coronary artery disease status post CABG.  Cardio vascular risk factors include hypertension, diabetes and dyslipidemia  2.  Physical activities:limited by obesity and exercise associated low back pain does exercise using a bike and treadmill sporadically  3.  Depression/mood:no history of major depression or mood disorder  4.  Hearing:no deficits  5.  ADL's:independent  6.  Fall risk:moderate due to obesity  7.  Home safety:no problems identified  8.  Height weight, and visual acuity;height and weight stable no change in visual acuity has had glasses changed recently  9.  Counseling:continue efforts at weight loss and heart healthy diet  10. Lab orders based on risk factors:laboratory profile including lipid panel and urine microalbumin will be reviewed  11. Referral :follow-up cardiology and endocrinology  12. Care plan:continue efforts at aggressive risk factor modification  13. Cognitive assessment: alert and oriented with normal affect no cognitive dysfunction  14. Screening: Patient provided with a written and personalized 5-10 year screening schedule  in the AVS.    15. Provider List Update: primary care cardiology, endocrinology and radiology      Review of Systems  Constitutional: Positive for unexpected weight change.  HENT: Negative for congestion, dental problem, hearing loss, rhinorrhea, sinus pressure, sore throat and tinnitus.   Eyes: Negative for pain, discharge and visual disturbance.  Respiratory: Negative for cough and shortness of breath.   Cardiovascular: Negative for chest pain, palpitations and leg swelling.  Gastrointestinal: Negative for abdominal distention, abdominal pain, blood in stool, constipation, diarrhea, nausea and vomiting.  Endocrine: Positive for polydipsia.  Genitourinary: Negative for difficulty urinating, dysuria, flank pain, frequency, hematuria, pelvic pain, urgency, vaginal bleeding, vaginal discharge and vaginal pain.  Musculoskeletal: Negative for arthralgias, gait problem and joint swelling.  Skin: Negative for rash.  Neurological: Negative for dizziness, syncope, speech difficulty, weakness, numbness and headaches.  Hematological: Negative for adenopathy.  Psychiatric/Behavioral: Negative for agitation, behavioral problems and dysphoric mood. The patient is not nervous/anxious.        Objective:   Physical Exam  Constitutional: She is oriented to person, place, and time. She appears well-developed and well-nourished.  Obese Normal blood pressure  HENT:  Head: Normocephalic and atraumatic.  Right Ear: External ear normal.  Left Ear: External ear normal.  Mouth/Throat: Oropharynx is clear and moist.  Pharyngeal crowding  Eyes: Conjunctivae and EOM are normal.  Neck: Normal range of motion. Neck supple. No JVD present. No thyromegaly present.  Cardiovascular: Normal rate, regular rhythm, normal heart sounds and intact distal pulses.   No murmur heard. Dorsalis pedis pulses full.  Posterior tibial pulses not easily palpable  Pulmonary/Chest: Effort normal and breath sounds normal. She  has no wheezes. She has no rales.  Abdominal: Soft. Bowel sounds are normal. She exhibits no distension and no mass. There is no tenderness. There is no rebound and no guarding.  Musculoskeletal:  Normal range of motion. She exhibits no edema or tenderness.  Neurological: She is alert and oriented to person, place, and time. She has normal reflexes. No cranial nerve deficit. She exhibits normal muscle tone. Coordination normal.  Skin: Skin is warm and dry. No rash noted.  Surgical scar right medial leg  Psychiatric: She has a normal mood and affect. Her behavior is normal.          Assessment & Plan:   Preventive health examination Medicare wellness visit Coronary artery disease status post CABG Ischemic cardiomyopathy.  Follow-up cardiology Dyslipidemia.  Statin intolerance.  Patient has investigated PCSK9 inhibitor therapy, which will cost her almost 500 dollars out of pocket.  Patient asked to recheck after the first the year to see if neck years insurance coverage changes Diabetes mellitus.  Will check urine for microalbumin and lipid panel Essential hypertension, well-controlled Obesity.  Weight loss encouraged Status post pacemaker for AV block Chronic systolic heart failure, compensated  Follow-up endocrine and cardiology Recheck here 6 months Laboratory studies reviewed  Nyoka Cowden

## 2016-08-17 NOTE — Patient Instructions (Addendum)
Limit your sodium (Salt) intake    It is important that you exercise regularly, at least 20 minutes 3 to 4 times per week.  If you develop chest pain or shortness of breath seek  medical attention.  You need to lose weight.  Consider a lower calorie diet and regular exercise.  Follow-up cardiology and endocrinology  Please see your eye doctor yearly to check for diabetic eye damage  Schedule your mammogram.

## 2016-08-17 NOTE — Progress Notes (Signed)
Pre visit review using our clinic review tool, if applicable. No additional management support is needed unless otherwise documented below in the visit note. 

## 2016-09-19 NOTE — Progress Notes (Deleted)
   Subjective:    Patient ID: Laurie [name], female    DOB: 02-06-1946, 71 y.o.   MRN: [id number]  HPI Pt returns for f/u of diabetes mellitus:   DM type: insulin-requiring type 2 Dx'ed: Q000111Q Complications: peripheral sensory neuropathy and CAD.   Therapy: insulin since 2011. GDM: 1985 DKA: never Severe hypoglycemia: never.   Pancreatitis: never.   Other: she has severe insulin resistance; she required very little insulin during a hospitalization in early 2014; she wants to delay weight-loss surgery for now; she takes human insulin, due to cost.   Interval history:  pt states she feels well in general.  she brings a record of her cbg's which i have reviewed today.  It varies from 92-300's.  It is in general higher as the day goes on, but she does not check at HS.   Pt also returns for f/u of multinodular goiter (dx'ed 2011; bx then showed FOLLICULAR LESION;COMMENT: THE FINDINGS FAVOR A HYPERPLASTIC LESION; she has been euthyroid).  She does not notice the goiter.     Review of Systems     Objective:   Physical Exam VITAL SIGNS:  See vs page.  GENERAL: no distress NECK: I can't tell details, but she seems to have a nodular goiter, L>R Pulses: dorsalis pedis intact bilat.  Skin: no ulcer on the feet. feet are of normal color and temp. There is a healed vein harvest scar at the right leg.  Feet: no deformity.  CV: trace bilat leg edema.  Neuro: sensation is intact to touch on the feet,but decreased from normal.         Assessment & Plan:

## 2016-09-23 LAB — CUP PACEART REMOTE DEVICE CHECK
Battery Remaining Percentage: 88 %
Battery Voltage: 3.08 V
Brady Statistic AP VP Percent: 1 %
Brady Statistic AS VP Percent: 99 %
Brady Statistic AS VS Percent: 1 %
HIGH POWER IMPEDANCE MEASURED VALUE: 70 Ohm
HighPow Impedance: 70 Ohm
Implantable Lead Implant Date: 20170222
Implantable Lead Location: 753858
Implantable Lead Location: 753859
Implantable Lead Model: 4298
Lead Channel Impedance Value: 400 Ohm
Lead Channel Impedance Value: 550 Ohm
Lead Channel Pacing Threshold Amplitude: 0.5 V
Lead Channel Pacing Threshold Pulse Width: 0.5 ms
Lead Channel Pacing Threshold Pulse Width: 0.5 ms
Lead Channel Sensing Intrinsic Amplitude: 12 mV
Lead Channel Setting Pacing Amplitude: 2 V
MDC IDC LEAD IMPLANT DT: 20140210
MDC IDC LEAD IMPLANT DT: 20170222
MDC IDC LEAD LOCATION: 753860
MDC IDC LEAD MODEL: 7122
MDC IDC MSMT BATTERY REMAINING LONGEVITY: 74 mo
MDC IDC MSMT LEADCHNL LV PACING THRESHOLD AMPLITUDE: 0.75 V
MDC IDC MSMT LEADCHNL RA IMPEDANCE VALUE: 360 Ohm
MDC IDC MSMT LEADCHNL RA PACING THRESHOLD PULSEWIDTH: 0.5 ms
MDC IDC MSMT LEADCHNL RA SENSING INTR AMPL: 3.7 mV
MDC IDC MSMT LEADCHNL RV PACING THRESHOLD AMPLITUDE: 0.625 V
MDC IDC PG IMPLANT DT: 20170222
MDC IDC PG SERIAL: 7341405
MDC IDC SESS DTM: 20171128085223
MDC IDC SET LEADCHNL LV PACING AMPLITUDE: 2 V
MDC IDC SET LEADCHNL LV PACING PULSEWIDTH: 0.5 ms
MDC IDC SET LEADCHNL RV PACING AMPLITUDE: 2 V
MDC IDC SET LEADCHNL RV PACING PULSEWIDTH: 0.5 ms
MDC IDC SET LEADCHNL RV SENSING SENSITIVITY: 0.5 mV
MDC IDC STAT BRADY AP VS PERCENT: 1 %
MDC IDC STAT BRADY RA PERCENT PACED: 1 %

## 2016-09-24 ENCOUNTER — Ambulatory Visit: Payer: Commercial Managed Care - HMO | Admitting: Endocrinology

## 2016-10-03 NOTE — Progress Notes (Signed)
Subjective:    Patient ID: Laurie [name], female    DOB: Jan 21, 1946, 71 y.o.   MRN: [id number]  HPI Pt returns for f/u of diabetes mellitus:   DM type: insulin-requiring type 2 Dx'ed: Q000111Q Complications: peripheral sensory neuropathy and CAD.   Therapy: insulin since 2011. GDM: 1985 DKA: never Severe hypoglycemia: never.   Pancreatitis: never.   Other: she has severe insulin resistance; she required very little insulin during a hospitalization in early 2014; she wants to delay weight-loss surgery for now; she takes human insulin, due to cost.   Interval history:  pt states she feels well in general.  no cbg record, but states cbg's are in general highest at lunch, and lowest in the afternoon.  She has mild hypoglycemia approx once every few mos.  She takes reg insulin 3 times a day (just before each meal) 150-170-150 units.   Pt also has multinodular goiter (dx'ed 2011; bx then showed HYPERPLASTIC LESION; she has been euthyroid; f/u US in 2017 suggested need for another [place] in late 2018).   Past Medical History:  Diagnosis Date  . Arthritis    "hands" (11/05/2015)  . Bell's palsy 01/05/2010  . CAD (coronary artery disease)    Cath  07/21/1999  mild ostial, 80% stenosis proximal LAD, 90% stenosis proximal Diag 1, 95% stenosis proximal OM 1, 80% stenosis proximal RCA, 95% stenosis mid RCA    CABG 07/22/99  LIMA to LAD, SVG to dx, SVG to OM, SVG to PDA  Dr. Carmin Muskrat 2012 no ischemia EF 67%   . CHF (congestive heart failure) ([place]) dx'd 10/17/2015  . Cystitis   . Heart murmur   . Hepatitis   . HIstory of Bell's palsy    October 2012   . Hyperlipidemia    Intolerance to several statins   . Hypertension   . Hypertensive heart disease without CHF   . Multinodular goiter   . Obesity (BMI 30-39.9)   . OSA on CPAP    "wear mask sometimes" (11/05/2015)  . Pacemaker   . Peripheral neuropathy ([place])   . Seasonal allergies   . Thyromegaly    mild  . Type II diabetes mellitus ([place])      Past Surgical History:  Procedure Laterality Date  . BI-VENTRICULAR PACEMAKER UPGRADE  11/05/2015   "upgraded my pacemaker"  . BIOPSY THYROID  05/2010   percutaneous  . CARDIAC CATHETERIZATION N/A 10/21/2015   Procedure: Left Heart Cath and Cors/Grafts Angiography;  Surgeon: Belva Crome, MD;  Location: [facility];  Service: Cardiovascular;  Laterality: N/A;  . CARPAL TUNNEL RELEASE    . CATARACT EXTRACTION W/ INTRAOCULAR LENS  IMPLANT, BILATERAL Right 01/06/2015  . CATARACT EXTRACTION W/ INTRAOCULAR LENS IMPLANT Left 12/16/2014  . CORONARY ARTERY BYPASS GRAFT  2000  . CYSTECTOMY Right    "middle finger"  . DENTAL IMPLANTS    . EP IMPLANTABLE DEVICE N/A 11/05/2015   Procedure: BiV Upgrade;  Surgeon: Deboraha Sprang, MD;  Location: [place] [facility];  Service: Cardiovascular;  Laterality: N/A;  . HEEL SPUR EXCISION Left    "& clipped a tendon that went thru bottom of my foot"  . LEAD REVISION N/A 10/25/2012   Procedure: LEAD REVISION;  Surgeon: Evans Lance, MD;  Location: [facility] CATH LAB;  Service: Cardiovascular;  Laterality: N/A;  . LEFT HEART CATHETERIZATION WITH CORONARY ANGIOGRAM N/A 10/23/2012   Procedure: LEFT HEART CATHETERIZATION WITH CORONARY ANGIOGRAM;  Surgeon: Jacolyn Reedy, MD;  Location: [place] CATH LAB;  Service: Cardiovascular;  Laterality: N/A;  . PERMANENT PACEMAKER INSERTION N/A 10/23/2012   Procedure: PERMANENT PACEMAKER INSERTION;  Surgeon: Deboraha Sprang, MD;  Location: [facility] CATH LAB;  Service: Cardiovascular;  Laterality: N/A;  . TEMPORARY PACEMAKER INSERTION N/A 10/22/2012   Procedure: TEMPORARY PACEMAKER INSERTION;  Surgeon: Troy Sine, MD;  Location: [facility] CATH LAB;  Service: Cardiovascular;  Laterality: N/A;  . TUBAL LIGATION      Social History   Social History  . Marital status: Married    Spouse name: N/A  . Number of children: 2  . Years of education: N/A   Occupational History  .  Retired   Social History Main Topics  . Smoking status: Never  Smoker  . Smokeless tobacco: Never Used  . Alcohol use No  . Drug use: No  . Sexual activity: No   Other Topics Concern  . Not on file   Social History Narrative   Does not work outside the home    Current Outpatient Prescriptions on File Prior to Visit  Medication Sig Dispense Refill  . ACCU-CHEK FASTCLIX LANCETS MISC USE  TO CHECK BLOOD SUGAR FOUR TIMES DAILY 408 each 2  . ACCU-CHEK SMARTVIEW test strip USE  TO CHECK BLOOD SUGAR FOUR TIMES DAILY 400 each 1  . Alcohol Swabs (B-D SINGLE USE SWABS REGULAR) PADS Use 8 daily for testing blood sugar and insulin injections. 800 each 1  . AMBULATORY NON FORMULARY MEDICATION Take 1 capsule by mouth daily. Omega Q Plus - Take 1 capsule once daily    . aspirin EC 81 MG EC tablet Take 1 tablet (81 mg total) by mouth daily.    [name] b complex vitamins tablet Take 1 tablet by mouth daily.    . BD INSULIN SYRINGE ULTRAFINE 31G X 15/64" 1 ML MISC USE SYRINGES 10 TIMES A DAY WITH INSULIN AS DIRECTED 300 each 0  . beta carotene w/minerals (OCUVITE) tablet Take 1 tablet by mouth daily.    . Blood Glucose Calibration (ACCU-CHEK SMARTVIEW CONTROL) LIQD Use to calibrate blood glucose meter. 3 each 1  . cholecalciferol (VITAMIN D) 1000 UNITS tablet Take 2,000 Units by mouth daily.    [name] Fexofenadine HCl (ALLEGRA ALLERGY PO) Take by mouth.    . furosemide (LASIX) 40 MG tablet Take 2 tablets by mouth every morning and 1 tablet by mouth every evening. Take additional 1 tablet by mouth for wt gain of 3 lbs or more. 360 tablet 3  . KLOR-CON M20 20 MEQ tablet TAKE ONE TABLET BY MOUTH TWICE DAILY (Patient taking differently: TAKE ONE TABLET BY MOUTH  DAILY) 180 tablet 1  . lisinopril (PRINIVIL,ZESTRIL) 20 MG tablet     . metoprolol succinate (TOPROL-XL) 50 MG 24 hr tablet Take 1 tablet (50 mg total) by mouth daily. Take with or immediately following a meal. 30 tablet 12  . nitroGLYCERIN (NITROSTAT) 0.4 MG SL tablet Place 0.4 mg under the tongue as needed.     .  potassium chloride SA (KLOR-CON M20) 20 MEQ tablet Take 1 tablet (20 mEq total) by mouth daily. 180 tablet 2  . Probiotic Product (PROBIOTIC DAILY) CAPS Take 1 capsule by mouth daily.    . psyllium (METAMUCIL) 58.6 % powder Take 1 packet by mouth 3 (three) times daily.    . sodium chloride (OCEAN) 0.65 % nasal spray Place 1 spray into the nose as needed for congestion.     No current facility-administered medications on file prior to  visit.     Allergies  Allergen Reactions  . Statins     Intolerance to several- unknown reaction  . Tricor [Fenofibrate]     Leg pain  . Latex     Area gets red and itchy    Family History  Problem Relation Age of Onset  . Diabetes Father   . Heart disease Father   . Diabetes Other     Siblings and 2 children  . Heart disease Other     CAD  . Diabetes Mother   . Colon cancer Neg Hx   . Colon polyps Neg Hx     BP 132/72   Pulse (!) 102   Ht 5' 2.5" (1.588 m)   Wt 204 lb (92.5 kg)   SpO2 93%   BMI 36.72 kg/m    Review of Systems Denies LOC    Objective:   Physical Exam VITAL SIGNS:  See vs page.  GENERAL: no distress Pulses: dorsalis pedis intact bilat.  Skin: no ulcer on the feet. feet are of normal color and temp. There is a healed vein harvest scar at the right leg.  Feet: no deformity.  CV: trace bilat leg edema.  Neuro: sensation is intact to touch on the feet,but decreased from normal.    A1c=7.7%    Assessment & Plan:  Insulin-requiring type 2 DM, with CAD: she needs increased rx, if it can be done with a regimen that avoids or minimizes hypoglycemia.   Patient is advised the following: Patient Instructions  check your blood sugar 4 times a day--before the 3 meals, and at bedtime (the bedtime reading is important, as we may need to increase the suppertime insulin).  also check if you have symptoms of your blood sugar being too high or too low.  please keep a record of the readings and bring it to your next appointment  here.  please call us sooner if you are having low blood sugar episodes.   Please change reg insulin to 160 at breakfast, 160 at lunch, and 150 at dinner.   Please come back for a follow-up appointment in 4 months.

## 2016-10-04 ENCOUNTER — Other Ambulatory Visit: Payer: Self-pay | Admitting: Endocrinology

## 2016-10-06 ENCOUNTER — Encounter: Payer: Self-pay | Admitting: Endocrinology

## 2016-10-06 ENCOUNTER — Ambulatory Visit (INDEPENDENT_AMBULATORY_CARE_PROVIDER_SITE_OTHER): Payer: Commercial Managed Care - HMO | Admitting: Endocrinology

## 2016-10-06 VITALS — BP 132/72 | HR 102 | Ht 62.5 in | Wt 204.0 lb

## 2016-10-06 DIAGNOSIS — E1159 Type 2 diabetes mellitus with other circulatory complications: Secondary | ICD-10-CM

## 2016-10-06 DIAGNOSIS — IMO0002 Reserved for concepts with insufficient information to code with codable children: Secondary | ICD-10-CM

## 2016-10-06 DIAGNOSIS — E088 Diabetes mellitus due to underlying condition with unspecified complications: Secondary | ICD-10-CM

## 2016-10-06 DIAGNOSIS — E0865 Diabetes mellitus due to underlying condition with hyperglycemia: Secondary | ICD-10-CM

## 2016-10-06 DIAGNOSIS — Z794 Long term (current) use of insulin: Secondary | ICD-10-CM | POA: Diagnosis not present

## 2016-10-06 LAB — POCT GLYCOSYLATED HEMOGLOBIN (HGB A1C): Hemoglobin A1C: 7.7

## 2016-10-06 MED ORDER — NOVOLIN R RELION 100 UNIT/ML IJ SOLN: INTRAMUSCULAR | 11 refills | Status: DC

## 2016-10-06 NOTE — Patient Instructions (Addendum)
check your blood sugar 4 times a day--before the 3 meals, and at bedtime (the bedtime reading is important, as we may need to increase the suppertime insulin).  also check if you have symptoms of your blood sugar being too high or too low.  please keep a record of the readings and bring it to your next appointment here.  please call us sooner if you are having low blood sugar episodes.   Please change reg insulin to 160 at breakfast, 160 at lunch, and 150 at dinner.   Please come back for a follow-up appointment in 4 months.

## 2016-10-13 ENCOUNTER — Other Ambulatory Visit: Payer: Self-pay | Admitting: Endocrinology

## 2016-11-08 ENCOUNTER — Ambulatory Visit (INDEPENDENT_AMBULATORY_CARE_PROVIDER_SITE_OTHER): Payer: Medicare HMO | Admitting: Internal Medicine

## 2016-11-08 ENCOUNTER — Encounter: Payer: Self-pay | Admitting: Internal Medicine

## 2016-11-08 VITALS — BP 136/72 | HR 77 | Ht 62.0 in | Wt 208.4 lb

## 2016-11-08 DIAGNOSIS — I5022 Chronic systolic (congestive) heart failure: Secondary | ICD-10-CM

## 2016-11-08 DIAGNOSIS — Z95810 Presence of automatic (implantable) cardiac defibrillator: Secondary | ICD-10-CM

## 2016-11-08 DIAGNOSIS — I442 Atrioventricular block, complete: Secondary | ICD-10-CM

## 2016-11-08 DIAGNOSIS — Z950 Presence of cardiac pacemaker: Secondary | ICD-10-CM

## 2016-11-08 DIAGNOSIS — I429 Cardiomyopathy, unspecified: Secondary | ICD-10-CM

## 2016-11-08 LAB — CUP PACEART INCLINIC DEVICE CHECK
Brady Statistic RA Percent Paced: 0.95 %
Brady Statistic RV Percent Paced: 99.64 %
HIGH POWER IMPEDANCE MEASURED VALUE: 68.625
Implantable Lead Implant Date: 20140210
Implantable Lead Implant Date: 20170222
Implantable Lead Location: 753858
Implantable Lead Model: 4298
Lead Channel Impedance Value: 362.5 Ohm
Lead Channel Pacing Threshold Amplitude: 0.75 V
Lead Channel Pacing Threshold Amplitude: 0.75 V
Lead Channel Pacing Threshold Amplitude: 0.75 V
Lead Channel Pacing Threshold Pulse Width: 0.5 ms
Lead Channel Pacing Threshold Pulse Width: 0.5 ms
Lead Channel Pacing Threshold Pulse Width: 0.5 ms
Lead Channel Sensing Intrinsic Amplitude: 12 mV
Lead Channel Sensing Intrinsic Amplitude: 3.9 mV
Lead Channel Setting Pacing Amplitude: 2 V
Lead Channel Setting Pacing Pulse Width: 0.5 ms
Lead Channel Setting Pacing Pulse Width: 0.5 ms
Lead Channel Setting Sensing Sensitivity: 0.5 mV
MDC IDC LEAD IMPLANT DT: 20170222
MDC IDC LEAD LOCATION: 753859
MDC IDC LEAD LOCATION: 753860
MDC IDC MSMT LEADCHNL LV IMPEDANCE VALUE: 400 Ohm
MDC IDC MSMT LEADCHNL LV PACING THRESHOLD AMPLITUDE: 0.75 V
MDC IDC MSMT LEADCHNL LV PACING THRESHOLD PULSEWIDTH: 0.5 ms
MDC IDC MSMT LEADCHNL RA PACING THRESHOLD AMPLITUDE: 0.75 V
MDC IDC MSMT LEADCHNL RA PACING THRESHOLD PULSEWIDTH: 0.5 ms
MDC IDC MSMT LEADCHNL RA PACING THRESHOLD PULSEWIDTH: 0.5 ms
MDC IDC MSMT LEADCHNL RV IMPEDANCE VALUE: 575 Ohm
MDC IDC MSMT LEADCHNL RV PACING THRESHOLD AMPLITUDE: 0.75 V
MDC IDC PG IMPLANT DT: 20170222
MDC IDC PG SERIAL: 7341405
MDC IDC SESS DTM: 20180226121815
MDC IDC SET LEADCHNL LV PACING AMPLITUDE: 2 V
MDC IDC SET LEADCHNL RV PACING AMPLITUDE: 2 V

## 2016-11-08 NOTE — Progress Notes (Signed)
Patient Care Team: Marletta Lor, MD as PCP - General Jacolyn Reedy, MD as Consulting Physician (Cardiology)   HPI  Laurie [name] is a 71 y.o. female Seen in followup for pacemaker implantation 2/14 for intermittent complete heart block and syncope.She has some degree of sinus tachycardia with her mean heart rate right at 100 beats per minute;    she has been intolerant of beta blockers and is currently on calcium blocker.   Hospitalized for congestive heart failure was noted to have progressive left ventricular dysfunction and acute on chronic heart failure. The issue was raised as to whether this was pacemaker-induced cardiomyopathy as her ejection fraction  had gone to 30-35% with the evolution of 100% ventricular pacing whereas with no ventricular pacing in 7/15 LV function was normal.  2/17 she underwent CRT upgrade.  She has been much improved with far improved exercise tolerance with dyspnea and no edema  Follow-up echo 7/17 was reviewed today. EF was 40-45%  She's had no chest pain. She has however developed recurring cramping in her mid back with walking that is relieved by rest and reproducibly reinitiated.  She is noted to have atrial fibrillation on her device. In this regard she describes an episode of Hemianopsia  a couple of years ago   She has a history of coronary artery disease with prior CABG. [place] 2/14 demonstrated patent vein graft to the diagonal OM RCA and LIMA normal left ventricular function  she underwent catheterization during that hospitalization which  demonstrated interval occlusion of the SVG--RCA. It was postulated that this might explain the development of complete heart block  Thromboembolic risk factors ( age -18, HTN-1, TIA/CVA-2 , DM-1, Vasc disease -1, CHF-1, Gender-1) for a CHADSVASc Score of 7-9  Past Medical History:  Diagnosis Date  . Arthritis    "hands" (11/05/2015)  . Bell's palsy 01/05/2010  . CAD (coronary artery disease)      Cath  07/21/1999  mild ostial, 80% stenosis proximal LAD, 90% stenosis proximal Diag 1, 95% stenosis proximal OM 1, 80% stenosis proximal RCA, 95% stenosis mid RCA    CABG 07/22/99  LIMA to LAD, SVG to dx, SVG to OM, SVG to PDA  Dr. Carmin Muskrat 2012 no ischemia EF 67%   . CHF (congestive heart failure) ([place]) dx'd 10/17/2015  . Cystitis   . Heart murmur   . Hepatitis   . HIstory of Bell's palsy    October 2012   . Hyperlipidemia    Intolerance to several statins   . Hypertension   . Hypertensive heart disease without CHF   . Multinodular goiter   . Obesity (BMI 30-39.9)   . OSA on CPAP    "wear mask sometimes" (11/05/2015)  . Pacemaker   . Peripheral neuropathy ([place])   . Seasonal allergies   . Thyromegaly    mild  . Type II diabetes mellitus ([place])     Past Surgical History:  Procedure Laterality Date  . BI-VENTRICULAR PACEMAKER UPGRADE  11/05/2015   "upgraded my pacemaker"  . BIOPSY THYROID  05/2010   percutaneous  . CARDIAC CATHETERIZATION N/A 10/21/2015   Procedure: Left Heart Cath and Cors/Grafts Angiography;  Surgeon: Belva Crome, MD;  Location: [place] [facility];  Service: Cardiovascular;  Laterality: N/A;  . CARPAL TUNNEL RELEASE    . CATARACT EXTRACTION W/ INTRAOCULAR LENS  IMPLANT, BILATERAL Right 01/06/2015  . CATARACT EXTRACTION W/ INTRAOCULAR LENS IMPLANT Left 12/16/2014  . CORONARY  ARTERY BYPASS GRAFT  2000  . CYSTECTOMY Right    "middle finger"  . DENTAL IMPLANTS    . EP IMPLANTABLE DEVICE N/A 11/05/2015   Procedure: BiV Upgrade;  Surgeon: Deboraha Sprang, MD;  Location: [place] [facility];  Service: Cardiovascular;  Laterality: N/A;  . HEEL SPUR EXCISION Left    "& clipped a tendon that went thru bottom of my foot"  . LEAD REVISION N/A 10/25/2012   Procedure: LEAD REVISION;  Surgeon: Evans Lance, MD;  Location: [facility] CATH LAB;  Service: Cardiovascular;  Laterality: N/A;  . LEFT HEART CATHETERIZATION WITH CORONARY ANGIOGRAM N/A 10/23/2012   Procedure: LEFT  HEART CATHETERIZATION WITH CORONARY ANGIOGRAM;  Surgeon: Jacolyn Reedy, MD;  Location: [facility] CATH LAB;  Service: Cardiovascular;  Laterality: N/A;  . PERMANENT PACEMAKER INSERTION N/A 10/23/2012   Procedure: PERMANENT PACEMAKER INSERTION;  Surgeon: Deboraha Sprang, MD;  Location: [facility] CATH LAB;  Service: Cardiovascular;  Laterality: N/A;  . TEMPORARY PACEMAKER INSERTION N/A 10/22/2012   Procedure: TEMPORARY PACEMAKER INSERTION;  Surgeon: Troy Sine, MD;  Location: [facility] C[facility] LAB;  Service: Cardiovascular;  Laterality: N/A;  . TUBAL LIGATION      Current Outpatient Prescriptions  Medication Sig Dispense Refill  . ACCU-CHEK FASTCLIX LANCETS MISC USE  TO CHECK BLOOD SUGAR FOUR TIMES DAILY 408 each 2  . ACCU-CHEK SMARTVIEW test strip USE  TO CHECK BLOOD SUGAR FOUR TIMES DAILY 400 each 1  . Alcohol Swabs (B-D SINGLE USE SWABS REGULAR) PADS Use 8 daily for testing blood sugar and insulin injections. 800 each 1  . AMBULATORY NON FORMULARY MEDICATION Take 1 capsule by mouth daily. Omega Q Plus - Take 1 capsule once daily    . aspirin EC 81 MG EC tablet Take 1 tablet (81 mg total) by mouth daily.    [name] b complex vitamins tablet Take 1 tablet by mouth daily.    . BD INSULIN SYRINGE ULTRAFINE 31G X 15/64" 1 ML MISC USE 10 ITMES A DAY WITH INSULIN AS DIRECTED 300 each 0  . beta carotene w/minerals (OCUVITE) tablet Take 1 tablet by mouth daily.    . Blood Glucose Calibration (ACCU-CHEK SMARTVIEW CONTROL) LIQD Use to calibrate blood glucose meter. 3 each 1  . cholecalciferol (VITAMIN D) 1000 UNITS tablet Take 2,000 Units by mouth daily.    [name] Fexofenadine HCl (ALLEGRA ALLERGY PO) Take by mouth.    . furosemide (LASIX) 40 MG tablet Take 2 tablets by mouth every morning and 1 tablet by mouth every evening. Take additional 1 tablet by mouth for wt gain of 3 lbs or more. 360 tablet 3  . lisinopril (PRINIVIL,ZESTRIL) 20 MG tablet     . metoprolol succinate (TOPROL-XL) 50 MG 24 hr tablet Take 1 tablet (50 mg total) by  mouth daily. Take with or immediately following a meal. 30 tablet 12  . nitroGLYCERIN (NITROSTAT) 0.4 MG SL tablet Place 0.4 mg under the tongue as needed.     [name] NOVOLIN R 100 UNIT/ML injection INJECT 160 UNITS UNDER THE SKIN AT BREAKFAST, 160 UNITS AT LUNCH, AND 150 UNITS AT DINNER 400 mL 12  . potassium chloride SA (KLOR-CON M20) 20 MEQ tablet Take 1 tablet (20 mEq total) by mouth daily. 180 tablet 2  . Probiotic Product (PROBIOTIC DAILY) CAPS Take 1 capsule by mouth daily.    . psyllium (METAMUCIL) 58.6 % powder Take 1 packet by mouth 3 (three) times daily.    . sodium chloride (OCEAN) 0.65 % nasal spray  Place 1 spray into the nose as needed for congestion.     No current facility-administered medications for this visit.     Allergies  Allergen Reactions  . Statins     Intolerance to several- unknown reaction  . Tricor [Fenofibrate]     Leg pain  . Latex     Area gets red and itchy    Review of Systems negative except from HPI and PMH  Physical Exam BP 136/72   Pulse 77   Ht 5\' 2"  (1.575 m)   Wt 208 lb 6.4 oz (94.5 kg)   SpO2 98%   BMI 38.12 kg/m  Well developed and well nourished in no acute distress HENT asymmetric smile with right labial fold flattening E scleral and icterus clear Neck Supple JVP flat; carotids brisk and full Clear to ausculation  Device pocket well healed; without hematoma or erythema.  There is no tethering  NO COLLATERALS Regular rate and rhythm, no murmurs gallops or rub Soft with active bowel sounds No clubbing cyanosis Trace Edema Alert and oriented, grossly normal motor and sensory function Skin Warm and Dry  ECG sinus At 77 with PVCs synchronous pacing. QRS is upright in V1 and negative lead 1  Assessment and  Plan Complete heart block permanent  CRT-D The patient's device was interrogated.  The information was reviewed. No changes were made in the programming.   Hypertension  Well controlled   Coronary artery disease status post  CABG previously normal LV function  .Without symptoms of ischemia  Cardiomyopathy ? Mechanism  Ischemic vs pacemaker induced  Atrial fibrillation  Question back claudication  The patient was significantly improved following CRT.    LVEF was also improved. We'll defer repeat evaluation to Dr. Judith Part.  There are 2 acute issues of concern. The first is this comfort in her back brought on by walking relieved by rest. She wonders whether to spasm. I wonder whether it may be aortoiliac occlusive disease. I will defer evaluation to Dr. Judith Part.  Secondly, she has atrial fibrillation. The episode of her hemianopsia a couple of years ago certainly raises the specter Related to thromboembolic risk.  Her CHADS-VASc score is 7--9. There are recent data looking at thromboembolic risk in patients without atrial fibrillation based on CHADS-VASc score. With her atrial fibrillation not withstanding his relatively brief duration and given the episode of visual impairment a couple of years ago I would have a low threshold of initial coagulation. I will forward this information to Dr. Wynonia Lawman for his consideration.  More than 50% of 45 min was spent in counseling related to the above

## 2016-11-08 NOTE — Patient Instructions (Signed)
Medication Instructions: - Your physician recommends that you continue on your current medications as directed. Please refer to the Current Medication list given to you today.  Labwork: - none ordered  Procedures/Testing: - none ordered  Follow-Up: - Remote monitoring is used to monitor your Pacemaker of ICD from home. This monitoring reduces the number of office visits required to check your device to one time per year. It allows [place] to keep an eye on the functioning of your device to ensure it is working properly. You are scheduled for a device check from home on 02/08/17. You may send your transmission at any time that day. If you have a wireless device, the transmission will be sent automatically. After your physician reviews your transmission, you will receive a postcard with your next transmission date.  - Your physician wants you to follow-up in: 1 year with Dr. [name].  You will receive a reminder letter in the mail two months in advance. If you don't receive a letter, please call our office to schedule the follow-up appointment.  Any Additional Special Instructions Will Be Listed Below (If Applicable).     If you need a refill on your cardiac medications before your next appointment, please call your pharmacy.

## 2016-12-08 ENCOUNTER — Other Ambulatory Visit: Payer: Self-pay | Admitting: Endocrinology

## 2016-12-21 DIAGNOSIS — Z95810 Presence of automatic (implantable) cardiac defibrillator: Secondary | ICD-10-CM | POA: Diagnosis not present

## 2016-12-21 DIAGNOSIS — I119 Hypertensive heart disease without heart failure: Secondary | ICD-10-CM | POA: Diagnosis not present

## 2016-12-21 DIAGNOSIS — R0789 Other chest pain: Secondary | ICD-10-CM | POA: Diagnosis not present

## 2016-12-21 DIAGNOSIS — Z951 Presence of aortocoronary bypass graft: Secondary | ICD-10-CM | POA: Diagnosis not present

## 2016-12-21 DIAGNOSIS — I2510 Atherosclerotic heart disease of native coronary artery without angina pectoris: Secondary | ICD-10-CM | POA: Diagnosis not present

## 2016-12-21 DIAGNOSIS — I5022 Chronic systolic (congestive) heart failure: Secondary | ICD-10-CM | POA: Diagnosis not present

## 2016-12-21 DIAGNOSIS — E785 Hyperlipidemia, unspecified: Secondary | ICD-10-CM | POA: Diagnosis not present

## 2016-12-21 DIAGNOSIS — E668 Other obesity: Secondary | ICD-10-CM | POA: Diagnosis not present

## 2016-12-21 DIAGNOSIS — E1121 Type 2 diabetes mellitus with diabetic nephropathy: Secondary | ICD-10-CM | POA: Diagnosis not present

## 2017-01-06 DIAGNOSIS — R0789 Other chest pain: Secondary | ICD-10-CM | POA: Diagnosis not present

## 2017-01-06 DIAGNOSIS — I119 Hypertensive heart disease without heart failure: Secondary | ICD-10-CM | POA: Diagnosis not present

## 2017-01-06 DIAGNOSIS — E785 Hyperlipidemia, unspecified: Secondary | ICD-10-CM | POA: Diagnosis not present

## 2017-01-06 DIAGNOSIS — Z95810 Presence of automatic (implantable) cardiac defibrillator: Secondary | ICD-10-CM | POA: Diagnosis not present

## 2017-01-06 DIAGNOSIS — E668 Other obesity: Secondary | ICD-10-CM | POA: Diagnosis not present

## 2017-01-06 DIAGNOSIS — I2510 Atherosclerotic heart disease of native coronary artery without angina pectoris: Secondary | ICD-10-CM | POA: Diagnosis not present

## 2017-01-06 DIAGNOSIS — E1121 Type 2 diabetes mellitus with diabetic nephropathy: Secondary | ICD-10-CM | POA: Diagnosis not present

## 2017-01-06 DIAGNOSIS — Z951 Presence of aortocoronary bypass graft: Secondary | ICD-10-CM | POA: Diagnosis not present

## 2017-01-06 DIAGNOSIS — I5022 Chronic systolic (congestive) heart failure: Secondary | ICD-10-CM | POA: Diagnosis not present

## 2017-01-14 ENCOUNTER — Ambulatory Visit: Payer: Medicare HMO | Admitting: Internal Medicine

## 2017-01-21 ENCOUNTER — Encounter: Payer: Self-pay | Admitting: Internal Medicine

## 2017-01-21 ENCOUNTER — Ambulatory Visit (INDEPENDENT_AMBULATORY_CARE_PROVIDER_SITE_OTHER): Payer: Medicare HMO | Admitting: Internal Medicine

## 2017-01-21 VITALS — BP 112/78 | HR 99 | Temp 98.3°F | Wt 204.4 lb

## 2017-01-21 DIAGNOSIS — I5022 Chronic systolic (congestive) heart failure: Secondary | ICD-10-CM | POA: Diagnosis not present

## 2017-01-21 DIAGNOSIS — Z794 Long term (current) use of insulin: Secondary | ICD-10-CM | POA: Diagnosis not present

## 2017-01-21 DIAGNOSIS — I2510 Atherosclerotic heart disease of native coronary artery without angina pectoris: Secondary | ICD-10-CM | POA: Diagnosis not present

## 2017-01-21 DIAGNOSIS — E1140 Type 2 diabetes mellitus with diabetic neuropathy, unspecified: Secondary | ICD-10-CM

## 2017-01-21 DIAGNOSIS — E7800 Pure hypercholesterolemia, unspecified: Secondary | ICD-10-CM

## 2017-01-21 DIAGNOSIS — G629 Polyneuropathy, unspecified: Secondary | ICD-10-CM | POA: Diagnosis not present

## 2017-01-21 NOTE — Progress Notes (Signed)
Subjective:    Patient ID: Laurie [name], female    DOB: 01/24/1946, 71 y.o.   MRN: [id number]  HPI  Lab Results  Component Value Date   HGBA1C 7.7 10/06/2016   71 year old patient who is seen today for her six-month follow-up. She has a history of insulin requiring diabetes with high insulin demands.  She is scheduled for endocrine follow-up on 02/04/2017. She has coronary artery disease, and statin intolerance.  She has recently been seen by cardiology and is on PCSK inhibitor therapy; presently this is constipation about 700 dollars per month and it is unclear whether she will be able to tolerate long-term.  Her cardiac status has been stable. Denies any ischemic chest pain.  She has essential hypertension and a history of OSA. No new concerns or complaints  Past Medical History:  Diagnosis Date  . Arthritis    "hands" (11/05/2015)  . Bell's palsy 01/05/2010  . CAD (coronary artery disease)    Cath  07/21/1999  mild ostial, 80% stenosis proximal LAD, 90% stenosis proximal Diag 1, 95% stenosis proximal OM 1, 80% stenosis proximal RCA, 95% stenosis mid RCA    CABG 07/22/99  LIMA to LAD, SVG to dx, SVG to OM, SVG to PDA  Dr. Carmin Muskrat 2012 no ischemia EF 67%   . CHF (congestive heart failure) ([place]) dx'd 10/17/2015  . Cystitis   . Heart murmur   . Hepatitis   . HIstory of Bell's palsy    October 2012   . Hyperlipidemia    Intolerance to several statins   . Hypertension   . Hypertensive heart disease without CHF   . Multinodular goiter   . Obesity (BMI 30-39.9)   . OSA on CPAP    "wear mask sometimes" (11/05/2015)  . Pacemaker   . Peripheral neuropathy   . Seasonal allergies   . Thyromegaly    mild  . Type II diabetes mellitus ([place])      Social History   Social History  . Marital status: Married    Spouse name: N/A  . Number of children: 2  . Years of education: N/A   Occupational History  .  Retired   Social History Main Topics  . Smoking status: Never  Smoker  . Smokeless tobacco: Never Used  . Alcohol use No  . Drug use: No  . Sexual activity: No   Other Topics Concern  . Not on file   Social History Narrative   Does not work outside the home    Past Surgical History:  Procedure Laterality Date  . BI-VENTRICULAR PACEMAKER UPGRADE  11/05/2015   "upgraded my pacemaker"  . BIOPSY THYROID  05/2010   percutaneous  . CARDIAC CATHETERIZATION N/A 10/21/2015   Procedure: Left Heart Cath and Cors/Grafts Angiography;  Surgeon: Belva Crome, MD;  Location: [place] [facility];  Service: Cardiovascular;  Laterality: N/A;  . CARPAL TUNNEL RELEASE    . CATARACT EXTRACTION W/ INTRAOCULAR LENS  IMPLANT, BILATERAL Right 01/06/2015  . CATARACT EXTRACTION W/ INTRAOCULAR LENS IMPLANT Left 12/16/2014  . CORONARY ARTERY BYPASS GRAFT  2000  . CYSTECTOMY Right    "middle finger"  . DENTAL IMPLANTS    . EP IMPLANTABLE DEVICE N/A 11/05/2015   Procedure: BiV Upgrade;  Surgeon: Deboraha Sprang, MD;  Location: [place] [facility];  Service: Cardiovascular;  Laterality: N/A;  . HEEL SPUR EXCISION Left    "& clipped a tendon that went thru bottom of my foot"  .  LEAD REVISION N/A 10/25/2012   Procedure: LEAD REVISION;  Surgeon: Evans Lance, MD;  [facility] LAB;  Service: Cardiovascular;  Laterality: N/A;  . LEFT HEART CATHETERIZATION WITH CORONARY ANGIOGRAM N/A 10/23/2012   Procedure: LEFT HEART CATHETERIZATION WITH CORONARY ANGIOGRAM;  Surgeon: Jacolyn Reedy, MD;  Location: [facility] CATH LAB;  Service: Cardiovascular;  Laterality: N/A;  . PERMANENT PACEMAKER INSERTION N/A 10/23/2012   Procedure: PERMANENT PACEMAKER INSERTION;  Surgeon: Deboraha Sprang, MD;  Location: [facility] CATH LAB;  Service: Cardiovascular;  Laterality: N/A;  . TEMPORARY PACEMAKER INSERTION N/A 10/22/2012   Procedure: TEMPORARY PACEMAKER INSERTION;  Surgeon: Troy Sine, MD;  Location: [facility] CATH LAB;  Service: Cardiovascular;  Laterality: N/A;  . TUBAL LIGATION      Family History  Problem  Relation Age of Onset  . Diabetes Father   . Heart disease Father   . Diabetes Mother   . Diabetes Other        Siblings and 2 children  . Heart disease Other        CAD  . Colon cancer Neg Hx   . Colon polyps Neg Hx     Allergies  Allergen Reactions  . Statins     Intolerance to several- unknown reaction  . Tricor [Fenofibrate]     Leg pain  . Latex     Area gets red and itchy    Current Outpatient Prescriptions on File Prior to Visit  Medication Sig Dispense Refill  . ACCU-CHEK FASTCLIX LANCETS MISC USE  TO CHECK BLOOD SUGAR FOUR TIMES DAILY 408 each 2  . ACCU-CHEK SMARTVIEW test strip USE  TO CHECK BLOOD SUGAR FOUR TIMES DAILY 400 each 1  . Alcohol Swabs (B-D SINGLE USE SWABS REGULAR) PADS Use 8 daily for testing blood sugar and insulin injections. 800 each 1  . AMBULATORY NON FORMULARY MEDICATION Take 1 capsule by mouth daily. Omega Q Plus - Take 1 capsule once daily    . aspirin EC 81 MG EC tablet Take 1 tablet (81 mg total) by mouth daily.    [name] b complex vitamins tablet Take 1 tablet by mouth daily.    . BD INSULIN SYRINGE ULTRAFINE 31G X 15/64" 1 ML MISC USE 10 TIMES A DAY WITH INSULIN AS DIRECTED 300 each 0  . beta carotene w/minerals (OCUVITE) tablet Take 1 tablet by mouth daily.    . Blood Glucose Calibration (ACCU-CHEK SMARTVIEW CONTROL) LIQD Use to calibrate blood glucose meter. 3 each 1  . cholecalciferol (VITAMIN D) 1000 UNITS tablet Take 2,000 Units by mouth daily.    [name] Fexofenadine HCl (ALLEGRA ALLERGY PO) Take by mouth.    . furosemide (LASIX) 40 MG tablet Take 2 tablets by mouth every morning and 1 tablet by mouth every evening. Take additional 1 tablet by mouth for wt gain of 3 lbs or more. 360 tablet 3  . lisinopril (PRINIVIL,ZESTRIL) 20 MG tablet     . metoprolol succinate (TOPROL-XL) 50 MG 24 hr tablet Take 1 tablet (50 mg total) by mouth daily. Take with or immediately following a meal. 30 tablet 12  . nitroGLYCERIN (NITROSTAT) 0.4 MG SL tablet Place 0.4  mg under the tongue as needed.     [name] NOVOLIN R 100 UNIT/ML injection INJECT 160 UNITS UNDER THE SKIN AT BREAKFAST, 160 UNITS AT LUNCH, AND 150 UNITS AT DINNER 400 mL 12  . potassium chloride SA (KLOR-CON M20) 20 MEQ tablet Take 1 tablet (20 mEq total) by mouth daily. 180 tablet  2  . Probiotic Product (PROBIOTIC DAILY) CAPS Take 1 capsule by mouth daily.    . psyllium (METAMUCIL) 58.6 % powder Take 1 packet by mouth 3 (three) times daily.    . sodium chloride (OCEAN) 0.65 % nasal spray Place 1 spray into the nose as needed for congestion.     No current facility-administered medications on file prior to visit.     BP 112/78 (BP Location: Left Arm, Patient Position: Sitting, Cuff Size: Normal)   Pulse 99   Temp 98.3 F (36.8 C) (Oral)   Wt 204 lb 6.4 oz (92.7 kg)   SpO2 97%   BMI 37.39 kg/m     Review of Systems  Constitutional: Negative.   HENT: Negative for congestion, dental problem, hearing loss, rhinorrhea, sinus pressure, sore throat and tinnitus.   Eyes: Negative for pain, discharge and visual disturbance.  Respiratory: Negative for cough and shortness of breath.   Cardiovascular: Negative for chest pain, palpitations and leg swelling.  Gastrointestinal: Negative for abdominal distention, abdominal pain, blood in stool, constipation, diarrhea, nausea and vomiting.  Genitourinary: Negative for difficulty urinating, dysuria, flank pain, frequency, hematuria, pelvic pain, urgency, vaginal bleeding, vaginal discharge and vaginal pain.  Musculoskeletal: Negative for arthralgias, gait problem and joint swelling.  Skin: Negative for rash.  Neurological: Negative for dizziness, syncope, speech difficulty, weakness, numbness and headaches.  Hematological: Negative for adenopathy.  Psychiatric/Behavioral: Negative for agitation, behavioral problems and dysphoric mood. The patient is not nervous/anxious.        Objective:   Physical Exam  Constitutional: She is oriented to person,  place, and time. She appears well-developed and well-nourished.  Weight 202 Blood pressure 110/80  HENT:  Head: Normocephalic.  Right Ear: External ear normal.  Left Ear: External ear normal.  Mouth/Throat: Oropharynx is clear and moist.  Eyes: Conjunctivae and EOM are normal. Pupils are equal, round, and reactive to light.  Neck: Normal range of motion. Neck supple. No thyromegaly present.  Cardiovascular: Normal rate, regular rhythm, normal heart sounds and intact distal pulses.   Pulmonary/Chest: Effort normal and breath sounds normal.  Abdominal: Soft. Bowel sounds are normal. She exhibits no mass. There is no tenderness.  Musculoskeletal: Normal range of motion.  Lymphadenopathy:    She has no cervical adenopathy.  Neurological: She is alert and oriented to person, place, and time.  Skin: Skin is warm and dry. No rash noted.  Psychiatric: She has a normal mood and affect. Her behavior is normal.          Assessment & Plan:   Diabetes mellitus.  Follow-up endocrinology Coronary artery disease Dyslipidemia.  Follow-up cardiology Chronic systolic heart failure, compensated  Follow-up 6 months  Shayonna Ocampo Pilar Plate

## 2017-01-21 NOTE — Patient Instructions (Signed)
Limit your sodium (Salt) intake   Please check your hemoglobin A1c every 3 months  Please check your blood pressure on a regular basis.  If it is consistently greater than 150/90, please make an office appointment.  Follow-up cardiology  Follow-up endocrinology  Return in 6 months for follow-up

## 2017-02-04 ENCOUNTER — Ambulatory Visit (INDEPENDENT_AMBULATORY_CARE_PROVIDER_SITE_OTHER): Payer: Medicare HMO | Admitting: Endocrinology

## 2017-02-04 ENCOUNTER — Encounter: Payer: Self-pay | Admitting: Endocrinology

## 2017-02-04 VITALS — BP 122/72 | HR 86 | Ht 62.0 in | Wt 206.0 lb

## 2017-02-04 DIAGNOSIS — E1159 Type 2 diabetes mellitus with other circulatory complications: Secondary | ICD-10-CM

## 2017-02-04 DIAGNOSIS — Z794 Long term (current) use of insulin: Secondary | ICD-10-CM | POA: Diagnosis not present

## 2017-02-04 DIAGNOSIS — E088 Diabetes mellitus due to underlying condition with unspecified complications: Secondary | ICD-10-CM | POA: Diagnosis not present

## 2017-02-04 DIAGNOSIS — E1140 Type 2 diabetes mellitus with diabetic neuropathy, unspecified: Secondary | ICD-10-CM | POA: Diagnosis not present

## 2017-02-04 DIAGNOSIS — E0865 Diabetes mellitus due to underlying condition with hyperglycemia: Secondary | ICD-10-CM

## 2017-02-04 DIAGNOSIS — IMO0002 Reserved for concepts with insufficient information to code with codable children: Secondary | ICD-10-CM

## 2017-02-04 LAB — POCT GLYCOSYLATED HEMOGLOBIN (HGB A1C): Hemoglobin A1C: 8

## 2017-02-04 MED ORDER — NOVOLIN R 100 UNIT/ML IJ SOLN: INTRAMUSCULAR | 12 refills | Status: DC

## 2017-02-04 NOTE — Progress Notes (Signed)
Subjective:    Patient ID: Laurie [name], female    DOB: 1946-03-21, 71 y.o.   MRN: [id number]  HPI Pt returns for f/u of diabetes mellitus:   DM type: insulin-requiring type 2 Dx'ed: 5573 Complications: peripheral sensory neuropathy and CAD.   Therapy: insulin since 2011. GDM: 1985 DKA: never Severe hypoglycemia: never.   Pancreatitis: never.   Other: she has severe insulin resistance; she required very little insulin during a hospitalization in early 2014; she wants to delay weight-loss surgery for now; she takes human insulin, due to cost; she has long duration of action of insulin, and based on pattern of cbg's, does not need basal insulin.    Interval history:  pt states she feels well in general.  no cbg record, but states cbg's vary from 56-300's.  It is lowest fasting. Pt also has multinodular goiter (dx'ed 2011; bx then showed HYPERPLASTIC LESION; she has been euthyroid; f/u US in 2017 suggested need for another [place] in late 2018).  Past Medical History:  Diagnosis Date  . Arthritis    "hands" (11/05/2015)  . Bell's palsy 01/05/2010  . CAD (coronary artery disease)    Cath  07/21/1999  mild ostial, 80% stenosis proximal LAD, 90% stenosis proximal Diag 1, 95% stenosis proximal OM 1, 80% stenosis proximal RCA, 95% stenosis mid RCA    CABG 07/22/99  LIMA to LAD, SVG to dx, SVG to OM, SVG to PDA  Dr. Carmin Muskrat 2012 no ischemia EF 67%   . CHF (congestive heart failure) ([place]) dx'd 10/17/2015  . Cystitis   . Heart murmur   . Hepatitis   . HIstory of Bell's palsy    October 2012   . Hyperlipidemia    Intolerance to several statins   . Hypertension   . Hypertensive heart disease without CHF   . Multinodular goiter   . Obesity (BMI 30-39.9)   . OSA on CPAP    "wear mask sometimes" (11/05/2015)  . Pacemaker   . Peripheral neuropathy   . Seasonal allergies   . Thyromegaly    mild  . Type II diabetes mellitus ([place])     Past Surgical History:  Procedure Laterality Date   . BI-VENTRICULAR PACEMAKER UPGRADE  11/05/2015   "upgraded my pacemaker"  . BIOPSY THYROID  05/2010   percutaneous  . CARDIAC CATHETERIZATION N/A 10/21/2015   Procedure: Left Heart Cath and Cors/Grafts Angiography;  Surgeon: Belva Crome, MD;  Location: [facility];  Service: Cardiovascular;  Laterality: N/A;  . CARPAL TUNNEL RELEASE    . CATARACT EXTRACTION W/ INTRAOCULAR LENS  IMPLANT, BILATERAL Right 01/06/2015  . CATARACT EXTRACTION W/ INTRAOCULAR LENS IMPLANT Left 12/16/2014  . CORONARY ARTERY BYPASS GRAFT  2000  . CYSTECTOMY Right    "middle finger"  . DENTAL IMPLANTS    . EP IMPLANTABLE DEVICE N/A 11/05/2015   Procedure: BiV Upgrade;  Surgeon: Deboraha Sprang, MD;  Location: [facility];  Service: Cardiovascular;  Laterality: N/A;  . HEEL SPUR EXCISION Left    "& clipped a tendon that went thru bottom of my foot"  . LEAD REVISION N/A 10/25/2012   Procedure: LEAD REVISION;  Surgeon: Evans Lance, MD;  Location: [facility];  Service: Cardiovascular;  Laterality: N/A;  . LEFT HEART CATHETERIZATION WITH CORONARY ANGIOGRAM N/A 10/23/2012   Procedure: LEFT HEART CATHETERIZATION WITH CORONARY ANGIOGRAM;  Surgeon: Jacolyn Reedy, MD;  Location: [facility] CATH LAB;  Service: Cardiovascular;  Laterality: N/A;  .  PERMANENT PACEMAKER INSERTION N/A 10/23/2012   Procedure: PERMANENT PACEMAKER INSERTION;  Surgeon: Deboraha Sprang, MD;  Location: [facility];  Service: Cardiovascular;  Laterality: N/A;  . TEMPORARY PACEMAKER INSERTION N/A 10/22/2012   Procedure: TEMPORARY PACEMAKER INSERTION;  Surgeon: Troy Sine, MD;  Location: [facility] CATH LAB;  Service: Cardiovascular;  Laterality: N/A;  . TUBAL LIGATION      Social History   Social History  . Marital status: Married    Spouse name: N/A  . Number of children: 2  . Years of education: N/A   Occupational History  .  Retired   Social History Main Topics  . Smoking status: Never Smoker  . Smokeless tobacco: Never Used  . Alcohol use  No  . Drug use: No  . Sexual activity: No   Other Topics Concern  . Not on file   Social History Narrative   Does not work outside the home    Current Outpatient Prescriptions on File Prior to Visit  Medication Sig Dispense Refill  . ACCU-CHEK FASTCLIX LANCETS MISC USE  TO CHECK BLOOD SUGAR FOUR TIMES DAILY 408 each 2  . ACCU-CHEK SMARTVIEW test strip USE  TO CHECK BLOOD SUGAR FOUR TIMES DAILY 400 each 1  . Alcohol Swabs (B-D SINGLE USE SWABS REGULAR) PADS Use 8 daily for testing blood sugar and insulin injections. 800 each 1  . AMBULATORY NON FORMULARY MEDICATION Take 1 capsule by mouth daily. Omega Q Plus - Take 1 capsule once daily    . aspirin EC 81 MG EC tablet Take 1 tablet (81 mg total) by mouth daily.    [name] b complex vitamins tablet Take 1 tablet by mouth daily.    . BD INSULIN SYRINGE ULTRAFINE 31G X 15/64" 1 ML MISC USE 10 TIMES A DAY WITH INSULIN AS DIRECTED 300 each 0  . beta carotene w/minerals (OCUVITE) tablet Take 1 tablet by mouth daily.    . Blood Glucose Calibration (ACCU-CHEK SMARTVIEW CONTROL) LIQD Use to calibrate blood glucose meter. 3 each 1  . cholecalciferol (VITAMIN D) 1000 UNITS tablet Take 2,000 Units by mouth daily.    [name] Fexofenadine HCl (ALLEGRA ALLERGY PO) Take by mouth.    . furosemide (LASIX) 40 MG tablet Take 2 tablets by mouth every morning and 1 tablet by mouth every evening. Take additional 1 tablet by mouth for wt gain of 3 lbs or more. 360 tablet 3  . lisinopril (PRINIVIL,ZESTRIL) 20 MG tablet     . metoprolol succinate (TOPROL-XL) 50 MG 24 hr tablet Take 1 tablet (50 mg total) by mouth daily. Take with or immediately following a meal. 30 tablet 12  . nitroGLYCERIN (NITROSTAT) 0.4 MG SL tablet Place 0.4 mg under the tongue as needed.     . potassium chloride SA (KLOR-CON M20) 20 MEQ tablet Take 1 tablet (20 mEq total) by mouth daily. 180 tablet 2  . Probiotic Product (PROBIOTIC DAILY) CAPS Take 1 capsule by mouth daily.    . psyllium (METAMUCIL)  58.6 % powder Take 1 packet by mouth 3 (three) times daily.    . sodium chloride (OCEAN) 0.65 % nasal spray Place 1 spray into the nose as needed for congestion.     No current facility-administered medications on file prior to visit.     Allergies  Allergen Reactions  . Statins     Intolerance to several- unknown reaction  . Tricor [Fenofibrate]     Leg pain  . Latex     Area gets  red and itchy    Family History  Problem Relation Age of Onset  . Diabetes Father   . Heart disease Father   . Diabetes Mother   . Diabetes Other        Siblings and 2 children  . Heart disease Other        CAD  . Colon cancer Neg Hx   . Colon polyps Neg Hx     BP 122/72   Pulse 86   Ht 5\' 2"  (1.575 m)   Wt 206 lb (93.4 kg)   SpO2 93%   BMI 37.68 kg/m    Review of Systems Denies LOC.      Objective:   Physical Exam VITAL SIGNS:  See vs page.  GENERAL: no distress Pulses: dorsalis pedis intact bilat.  Skin: no ulcer on the feet. feet are of normal color and temp. There is a healed vein harvest scar at the right leg.  Feet: no deformity.  CV: trace bilat leg edema.  Neuro: sensation is intact to touch on the feet,but decreased from normal.     A1c=8.0%    Assessment & Plan:  Insulin-requiring type 2 DM, with CAD: worse  Patient Instructions  check your blood sugar 4 times a day--before the 3 meals, and at bedtime (the bedtime reading is important, as we may need to increase the suppertime insulin).  also check if you have symptoms of your blood sugar being too high or too low.  please keep a record of the readings and bring it to your next appointment here.  please call us sooner if you are having low blood sugar episodes.   Please increase the reg insulin to 170 at breakfast, 170 at lunch, and 160 at dinner.   Please come back for a follow-up appointment in 3 months.

## 2017-02-04 NOTE — Patient Instructions (Addendum)
check your blood sugar 4 times a day--before the 3 meals, and at bedtime (the bedtime reading is important, as we may need to increase the suppertime insulin).  also check if you have symptoms of your blood sugar being too high or too low.  please keep a record of the readings and bring it to your next appointment here.  please call us sooner if you are having low blood sugar episodes.   Please increase the reg insulin to 170 at breakfast, 170 at lunch, and 160 at dinner. Please come back for a follow-up appointment in 3 months.

## 2017-02-08 ENCOUNTER — Ambulatory Visit (INDEPENDENT_AMBULATORY_CARE_PROVIDER_SITE_OTHER): Payer: Medicare HMO | Admitting: *Deleted

## 2017-02-08 DIAGNOSIS — I428 Other cardiomyopathies: Secondary | ICD-10-CM | POA: Diagnosis not present

## 2017-02-08 DIAGNOSIS — I442 Atrioventricular block, complete: Secondary | ICD-10-CM | POA: Diagnosis not present

## 2017-02-08 DIAGNOSIS — I5022 Chronic systolic (congestive) heart failure: Secondary | ICD-10-CM | POA: Diagnosis not present

## 2017-02-10 NOTE — Progress Notes (Signed)
Remote ICD transmission.   

## 2017-02-11 LAB — CUP PACEART REMOTE DEVICE CHECK
Battery Remaining Longevity: 68 mo
Battery Remaining Percentage: 82 %
Brady Statistic AP VP Percent: 2.8 %
Brady Statistic AS VS Percent: 1 %
HighPow Impedance: 63 Ohm
HighPow Impedance: 63 Ohm
Implantable Lead Implant Date: 20140210
Implantable Lead Implant Date: 20170222
Implantable Lead Location: 753858
Implantable Lead Model: 4298
Implantable Pulse Generator Implant Date: 20170222
Lead Channel Impedance Value: 480 Ohm
Lead Channel Pacing Threshold Amplitude: 0.75 V
Lead Channel Pacing Threshold Pulse Width: 0.5 ms
Lead Channel Pacing Threshold Pulse Width: 0.5 ms
Lead Channel Pacing Threshold Pulse Width: 0.5 ms
Lead Channel Sensing Intrinsic Amplitude: 4 mV
Lead Channel Setting Pacing Amplitude: 2 V
Lead Channel Setting Pacing Amplitude: 2 V
Lead Channel Setting Pacing Amplitude: 2 V
Lead Channel Setting Pacing Pulse Width: 0.5 ms
Lead Channel Setting Pacing Pulse Width: 0.5 ms
Lead Channel Setting Sensing Sensitivity: 0.5 mV
MDC IDC LEAD IMPLANT DT: 20170222
MDC IDC LEAD LOCATION: 753859
MDC IDC LEAD LOCATION: 753860
MDC IDC MSMT BATTERY VOLTAGE: 3.01 V
MDC IDC MSMT LEADCHNL LV IMPEDANCE VALUE: 380 Ohm
MDC IDC MSMT LEADCHNL RA IMPEDANCE VALUE: 350 Ohm
MDC IDC MSMT LEADCHNL RA PACING THRESHOLD AMPLITUDE: 0.75 V
MDC IDC MSMT LEADCHNL RV PACING THRESHOLD AMPLITUDE: 0.625 V
MDC IDC MSMT LEADCHNL RV SENSING INTR AMPL: 12 mV
MDC IDC PG SERIAL: 7341405
MDC IDC SESS DTM: 20180529060017
MDC IDC STAT BRADY AP VS PERCENT: 1 %
MDC IDC STAT BRADY AS VP PERCENT: 97 %
MDC IDC STAT BRADY RA PERCENT PACED: 2.6 %

## 2017-02-18 ENCOUNTER — Encounter: Payer: Self-pay | Admitting: Cardiology

## 2017-03-06 IMAGING — US US SOFT TISSUE HEAD/NECK
1 series · 12 of 25 positions shown · non-contrast
Comparison: Prior thyroid ultrasound 05/04/2010; prior thyroid
nodule biopsy 06/10/2010

CLINICAL DATA: 69-year-old female with a history of thyroid
nodules. She underwent biopsy of the isthmic nodule on 06/10/2010.

EXAM:
THYROID ULTRASOUND
TECHNIQUE: Ultrasound examination of the thyroid gland and adjacent soft
tissues was performed.

[Series 1: us soft tissue head/neck · 0.09mm/px · 12 of 81 slices shown]
[im 4/81]
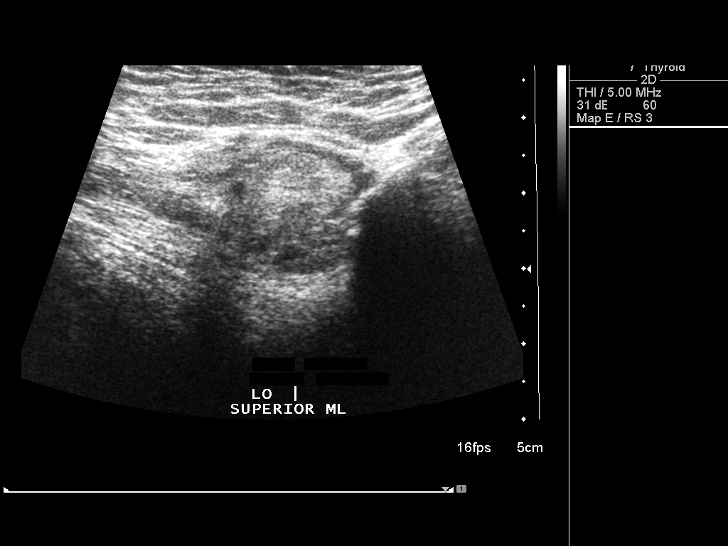
[im 11/81]
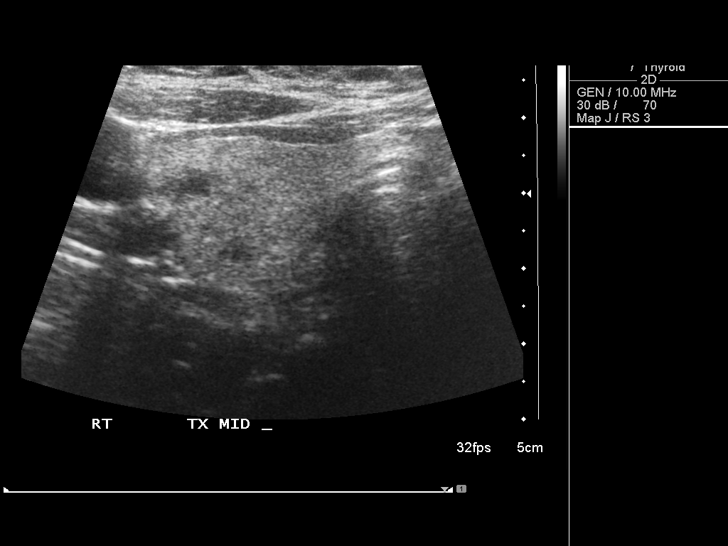
[im 17/81]
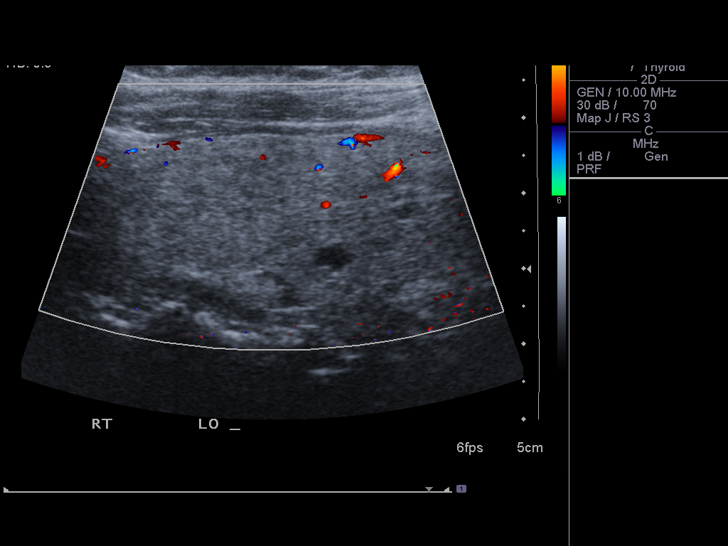
[im 24/81]
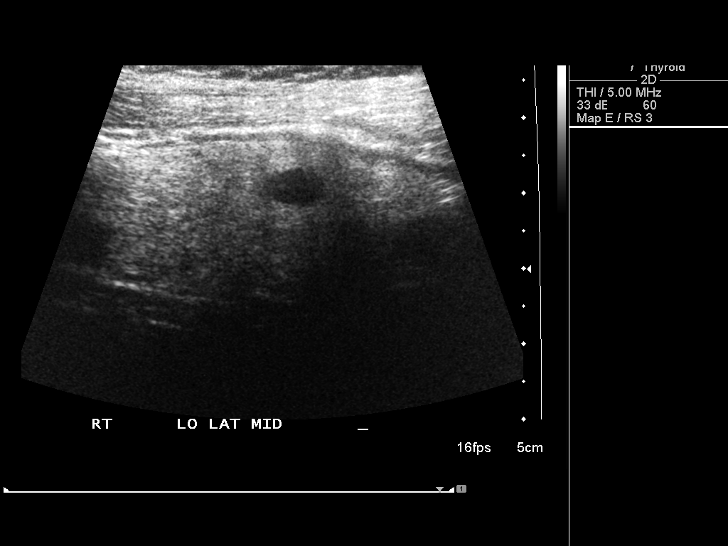
[im 31/81]
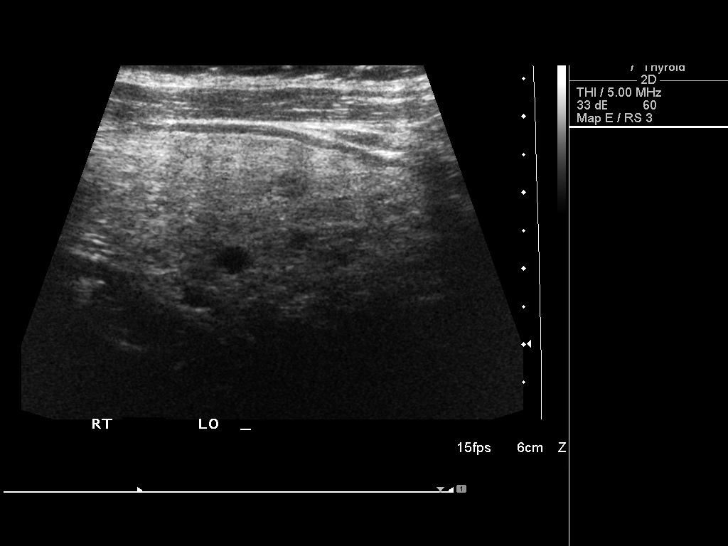
[im 37/81]
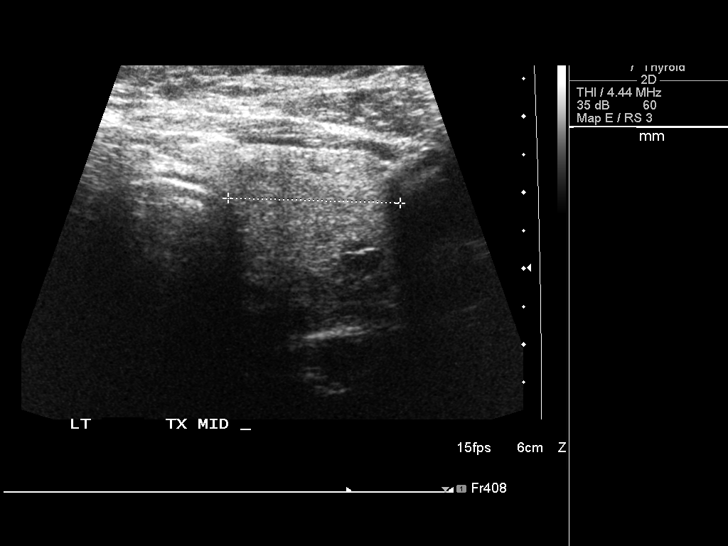
[im 44/81]
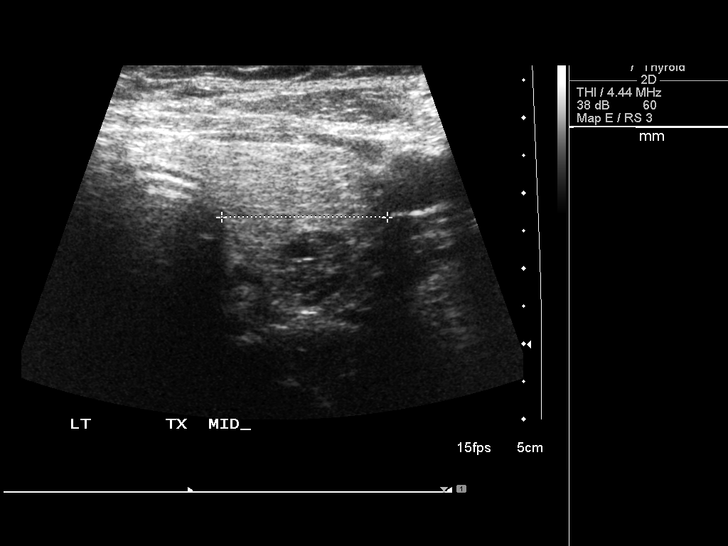
[im 51/81]
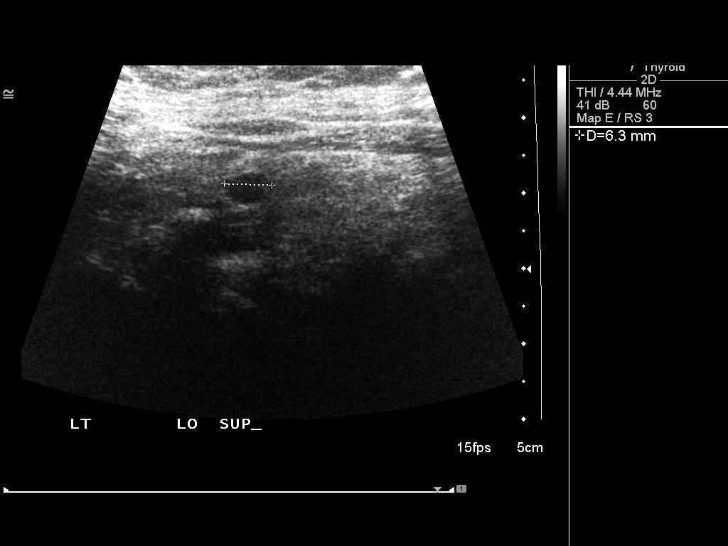
[im 57/81]
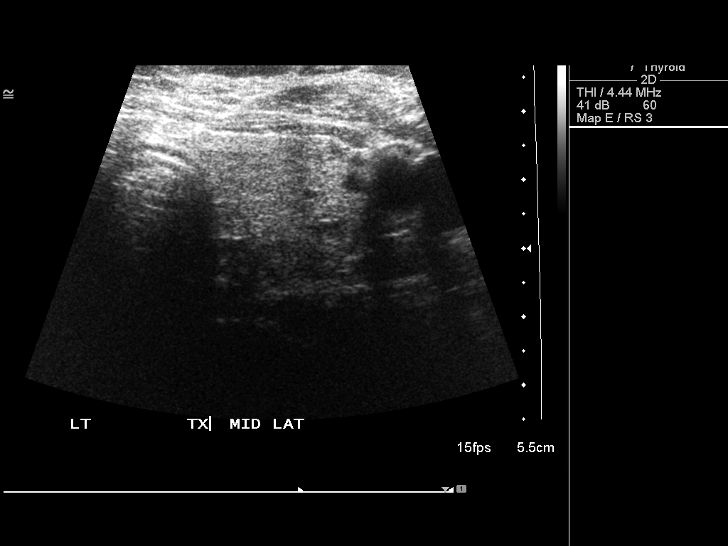
[im 64/81]
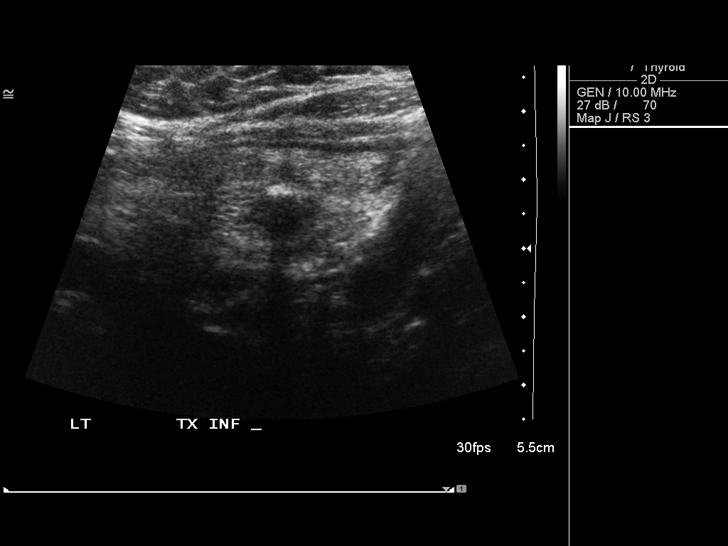
[im 71/81]
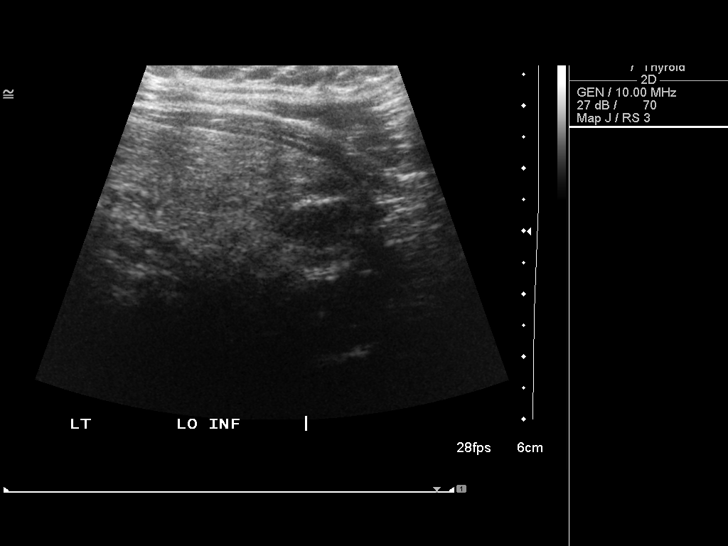
[im 77/81]
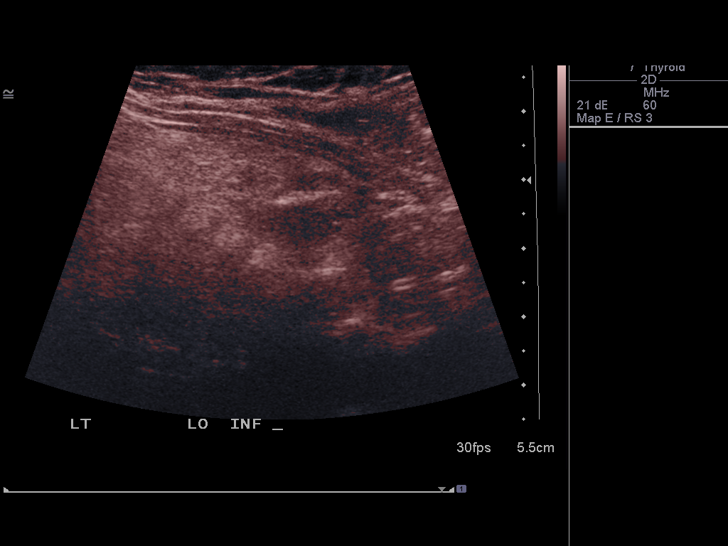

[12 of 25 positions shown; findings below may reference images not displayed]

FINDINGS: Parenchymal Echotexture: Moderately heterogenous

Estimated total number of nodules >/= 1 cm: 2

Number of spongiform nodules > 2 cm not described below (TR1): 0

Number of mixed cystic and solid nodules > 1.5 cm not described
below (TR2): 0

_________________________________________________________

Isthmus: 0.9 cm

Nodule # 1:

Location: Isthmus; exophytic superior to the isthmus

Size: 1.9 x 1.6 x 1.6 cm compared to 1.4 x 1.3 x 1.4 cm previously.

Composition: solid/almost completely solid (2)

Echogenicity: hyperechoic (1)

Shape: not taller-than-wide (0)

Margins: smooth (0)

Echogenic foci: none (0)

ACR TI-RADS total points: 3.

ACR TI-RADS risk category: TR3 (3 points).

ACR TI-RADS recommendations:

*Given size (1.5 - 2.4 cm) and appearance, a follow-up ultrasound in
1 year should be considered based on TI-RADS criteria.

_________________________________________________________

Right lobe: 6.4 x 2.5 x 2.9 cm

Several tiny hypoechoic cysts in the mid thyroid gland which do not
meet criteria for biopsy or dedicated imaging follow-up.

_________________________________________________________

Left lobe: 6.7 x 2.3 x 2.3 cm

Nodule # 2:

Location: Left; Mid

Size: 1.3 x 1.0 x 1.0 cm

Composition: spongiform (0)

Echogenicity: hypoechoic (2)

Shape: not taller-than-wide (0)

Margins: smooth (0)

Echogenic foci: none (0)

ACR TI-RADS total points: 2.

ACR TI-RADS risk category: TR2 (2 points).

ACR TI-RADS recommendations:

This nodule does NOT meet TI-RADS criteria for biopsy or dedicated
follow-up.

Nodule # 4:

Location: Left; Inferior

Size: 1.3 x 1.1 x 1.1 cm

Composition: solid/almost completely solid (2)

Echogenicity: hypoechoic (2)

Shape: not taller-than-wide (0)

Margins: ill-defined (0)

Echogenic foci: peripheral calcifications (2)

ACR TI-RADS total points: 6.

ACR TI-RADS risk category: TR4 (4-6 points).

ACR TI-RADS recommendations:

*Given size (1 - 1.5 cm) and appearance, a follow-up ultrasound in 1
year should be considered based on TI-RADS criteria.

Additional small left-sided thyroid nodules which do not meet
consensus criteria for either biopsy or follow-up.
IMPRESSION: 1. The previously biopsied nodule in the inferior aspect of the
thyroid isthmus is no longer visible.
2. Heterogeneous nodule superior and possibly exophytic from the
thyroid isthmus Nodule #1: ACR TI-RADS 3. Recommend: Given size (1.5
- 2.4 cm) and appearance, a follow-up ultrasound in 1 year should be
considered based on TI-RADS criteria
3. Left inferior Nodule #4: ACR TI-RADS 4. Recommend: Given size (1
- 1.5 cm) and appearance, a follow-up ultrasound in 1 year should be
considered based on TI-RADS criteria.
4. Multiple additional small thyroid nodules bilaterally which do
not meet consensus criteria for either biopsy or follow-up.
The above is in keeping with the ACR TI-RADS recommendations - [HOSPITAL] 1835;[DATE].

## 2017-03-13 DIAGNOSIS — I639 Cerebral infarction, unspecified: Secondary | ICD-10-CM

## 2017-03-13 HISTORY — DX: Cerebral infarction, unspecified: I63.9

## 2017-03-17 ENCOUNTER — Observation Stay (HOSPITAL_COMMUNITY): Payer: Medicare HMO

## 2017-03-17 ENCOUNTER — Encounter (HOSPITAL_COMMUNITY): Payer: Self-pay | Admitting: Emergency Medicine

## 2017-03-17 ENCOUNTER — Emergency Department (HOSPITAL_COMMUNITY): Payer: Medicare HMO

## 2017-03-17 ENCOUNTER — Inpatient Hospital Stay (HOSPITAL_COMMUNITY)
Admission: EM | Admit: 2017-03-17 | Discharge: 2017-03-20 | DRG: 065 | Disposition: A | Discharge status: Home / Self Care | Payer: Medicare HMO | Attending: Family Medicine | Admitting: Family Medicine

## 2017-03-17 DIAGNOSIS — I6330 Cerebral infarction due to thrombosis of unspecified cerebral artery: Secondary | ICD-10-CM | POA: Insufficient documentation

## 2017-03-17 DIAGNOSIS — Z9842 Cataract extraction status, left eye: Secondary | ICD-10-CM

## 2017-03-17 DIAGNOSIS — E0865 Diabetes mellitus due to underlying condition with hyperglycemia: Secondary | ICD-10-CM | POA: Diagnosis not present

## 2017-03-17 DIAGNOSIS — I6329 Cerebral infarction due to unspecified occlusion or stenosis of other precerebral arteries: Secondary | ICD-10-CM | POA: Diagnosis not present

## 2017-03-17 DIAGNOSIS — I5022 Chronic systolic (congestive) heart failure: Secondary | ICD-10-CM | POA: Diagnosis not present

## 2017-03-17 DIAGNOSIS — E669 Obesity, unspecified: Secondary | ICD-10-CM | POA: Diagnosis present

## 2017-03-17 DIAGNOSIS — G458 Other transient cerebral ischemic attacks and related syndromes: Secondary | ICD-10-CM | POA: Diagnosis not present

## 2017-03-17 DIAGNOSIS — Z9119 Patient's noncompliance with other medical treatment and regimen: Secondary | ICD-10-CM | POA: Diagnosis not present

## 2017-03-17 DIAGNOSIS — E785 Hyperlipidemia, unspecified: Secondary | ICD-10-CM | POA: Diagnosis not present

## 2017-03-17 DIAGNOSIS — R29702 NIHSS score 2: Secondary | ICD-10-CM | POA: Diagnosis present

## 2017-03-17 DIAGNOSIS — I639 Cerebral infarction, unspecified: Principal | ICD-10-CM | POA: Diagnosis present

## 2017-03-17 DIAGNOSIS — R29810 Facial weakness: Secondary | ICD-10-CM | POA: Diagnosis present

## 2017-03-17 DIAGNOSIS — Z9841 Cataract extraction status, right eye: Secondary | ICD-10-CM

## 2017-03-17 DIAGNOSIS — E042 Nontoxic multinodular goiter: Secondary | ICD-10-CM | POA: Diagnosis present

## 2017-03-17 DIAGNOSIS — Z794 Long term (current) use of insulin: Secondary | ICD-10-CM

## 2017-03-17 DIAGNOSIS — I2510 Atherosclerotic heart disease of native coronary artery without angina pectoris: Secondary | ICD-10-CM | POA: Diagnosis present

## 2017-03-17 DIAGNOSIS — Z6836 Body mass index (BMI) 36.0-36.9, adult: Secondary | ICD-10-CM | POA: Diagnosis not present

## 2017-03-17 DIAGNOSIS — Z95810 Presence of automatic (implantable) cardiac defibrillator: Secondary | ICD-10-CM | POA: Diagnosis present

## 2017-03-17 DIAGNOSIS — Z7982 Long term (current) use of aspirin: Secondary | ICD-10-CM

## 2017-03-17 DIAGNOSIS — I517 Cardiomegaly: Secondary | ICD-10-CM | POA: Diagnosis not present

## 2017-03-17 DIAGNOSIS — Z950 Presence of cardiac pacemaker: Secondary | ICD-10-CM

## 2017-03-17 DIAGNOSIS — Z951 Presence of aortocoronary bypass graft: Secondary | ICD-10-CM | POA: Diagnosis not present

## 2017-03-17 DIAGNOSIS — R4781 Slurred speech: Secondary | ICD-10-CM | POA: Diagnosis present

## 2017-03-17 DIAGNOSIS — M6281 Muscle weakness (generalized): Secondary | ICD-10-CM | POA: Diagnosis not present

## 2017-03-17 DIAGNOSIS — H4920 Sixth [abducent] nerve palsy, unspecified eye: Secondary | ICD-10-CM | POA: Diagnosis present

## 2017-03-17 DIAGNOSIS — E088 Diabetes mellitus due to underlying condition with unspecified complications: Secondary | ICD-10-CM | POA: Diagnosis not present

## 2017-03-17 DIAGNOSIS — Z961 Presence of intraocular lens: Secondary | ICD-10-CM | POA: Diagnosis present

## 2017-03-17 DIAGNOSIS — I110 Hypertensive heart disease with heart failure: Secondary | ICD-10-CM | POA: Diagnosis present

## 2017-03-17 DIAGNOSIS — E1142 Type 2 diabetes mellitus with diabetic polyneuropathy: Secondary | ICD-10-CM | POA: Diagnosis present

## 2017-03-17 DIAGNOSIS — R479 Unspecified speech disturbances: Secondary | ICD-10-CM | POA: Diagnosis not present

## 2017-03-17 DIAGNOSIS — G4733 Obstructive sleep apnea (adult) (pediatric): Secondary | ICD-10-CM | POA: Diagnosis present

## 2017-03-17 DIAGNOSIS — R2990 Unspecified symptoms and signs involving the nervous system: Secondary | ICD-10-CM | POA: Diagnosis not present

## 2017-03-17 DIAGNOSIS — E1151 Type 2 diabetes mellitus with diabetic peripheral angiopathy without gangrene: Secondary | ICD-10-CM | POA: Diagnosis not present

## 2017-03-17 DIAGNOSIS — E1040 Type 1 diabetes mellitus with diabetic neuropathy, unspecified: Secondary | ICD-10-CM | POA: Diagnosis present

## 2017-03-17 DIAGNOSIS — R4701 Aphasia: Secondary | ICD-10-CM | POA: Diagnosis not present

## 2017-03-17 DIAGNOSIS — Z79899 Other long term (current) drug therapy: Secondary | ICD-10-CM

## 2017-03-17 HISTORY — DX: Presence of automatic (implantable) cardiac defibrillator: Z95.810

## 2017-03-17 HISTORY — DX: Cerebral infarction, unspecified: I63.9

## 2017-03-17 HISTORY — DX: Pneumonia, unspecified organism: J18.9

## 2017-03-17 HISTORY — DX: Low back pain: M54.5

## 2017-03-17 HISTORY — DX: Low back pain, unspecified: M54.50

## 2017-03-17 HISTORY — DX: Other chronic pain: G89.29

## 2017-03-17 LAB — CBC
HEMATOCRIT: 37.8 % (ref 36.0–46.0)
HEMOGLOBIN: 12.4 g/dL (ref 12.0–15.0)
MCH: 31.2 pg (ref 26.0–34.0)
MCHC: 32.8 g/dL (ref 30.0–36.0)
MCV: 95.2 fL (ref 78.0–100.0)
Platelets: 194 10*3/uL (ref 150–400)
RBC: 3.97 MIL/uL (ref 3.87–5.11)
RDW: 13 % (ref 11.5–15.5)
WBC: 5.7 10*3/uL (ref 4.0–10.5)

## 2017-03-17 LAB — I-STAT CHEM 8, ED
BUN: 25 mg/dL — AB (ref 6–20)
CALCIUM ION: 1.21 mmol/L (ref 1.15–1.40)
CREATININE: 0.7 mg/dL (ref 0.44–1.00)
Chloride: 107 mmol/L (ref 101–111)
Glucose, Bld: 179 mg/dL — ABNORMAL HIGH (ref 65–99)
HEMATOCRIT: 36 % (ref 36.0–46.0)
Hemoglobin: 12.2 g/dL (ref 12.0–15.0)
Potassium: 3.8 mmol/L (ref 3.5–5.1)
Sodium: 141 mmol/L (ref 135–145)
TCO2: 24 mmol/L (ref 0–100)

## 2017-03-17 LAB — COMPREHENSIVE METABOLIC PANEL
ALT: 31 U/L (ref 14–54)
AST: 30 U/L (ref 15–41)
Albumin: 3.7 g/dL (ref 3.5–5.0)
Alkaline Phosphatase: 65 U/L (ref 38–126)
Anion gap: 8 (ref 5–15)
BILIRUBIN TOTAL: 0.4 mg/dL (ref 0.3–1.2)
BUN: 23 mg/dL — AB (ref 6–20)
CO2: 24 mmol/L (ref 22–32)
CREATININE: 0.89 mg/dL (ref 0.44–1.00)
Calcium: 9.5 mg/dL (ref 8.9–10.3)
Chloride: 107 mmol/L (ref 101–111)
GFR calc Af Amer: >60 mL/min (ref >60)
Glucose, Bld: 182 mg/dL — ABNORMAL HIGH (ref 65–99)
Potassium: 3.8 mmol/L (ref 3.5–5.1)
Sodium: 139 mmol/L (ref 135–145)
TOTAL PROTEIN: 6.9 g/dL (ref 6.5–8.1)

## 2017-03-17 LAB — DIFFERENTIAL
BASOS ABS: 0 10*3/uL (ref 0.0–0.1)
Basophils Relative: 1 %
Eosinophils Absolute: 0.2 10*3/uL (ref 0.0–0.7)
Eosinophils Relative: 3 %
LYMPHS ABS: 2.2 10*3/uL (ref 0.7–4.0)
LYMPHS PCT: 38 %
MONOS PCT: 8 %
Monocytes Absolute: 0.4 10*3/uL (ref 0.1–1.0)
NEUTROS ABS: 2.9 10*3/uL (ref 1.7–7.7)
Neutrophils Relative %: 50 %

## 2017-03-17 LAB — I-STAT TROPONIN, ED: TROPONIN I, POC: 0.03 ng/mL (ref 0.00–0.08)

## 2017-03-17 LAB — CBG MONITORING, ED: Glucose-Capillary: 175 mg/dL — ABNORMAL HIGH (ref 65–99)

## 2017-03-17 LAB — PROTIME-INR
INR: 0.94
Prothrombin Time: 12.5 seconds (ref 11.4–15.2)

## 2017-03-17 LAB — GLUCOSE, CAPILLARY: GLUCOSE-CAPILLARY: 99 mg/dL (ref 65–99)

## 2017-03-17 LAB — APTT: APTT: 28 s (ref 24–36)

## 2017-03-17 MED ORDER — FUROSEMIDE 20 MG PO TABS
20.0000 mg | ORAL_TABLET | Freq: Every day | ORAL | Status: DC
  Administered 2017-03-18 – 2017-03-20 (×3): 20 mg via ORAL
  Filled 2017-03-17 (×3): qty 1

## 2017-03-17 MED ORDER — POTASSIUM CHLORIDE CRYS ER 20 MEQ PO TBCR
20.0000 meq | EXTENDED_RELEASE_TABLET | Freq: Every day | ORAL | Status: DC
  Administered 2017-03-18 – 2017-03-20 (×3): 20 meq via ORAL
  Filled 2017-03-17 (×3): qty 1

## 2017-03-17 MED ORDER — PSYLLIUM 95 % PO PACK
1.0000 | PACK | Freq: Three times a day (TID) | ORAL | Status: DC
  Administered 2017-03-17 – 2017-03-18 (×2): 1 via ORAL
  Filled 2017-03-17 (×10): qty 1

## 2017-03-17 MED ORDER — ACETAMINOPHEN 160 MG/5ML PO SOLN: 650.0000 mg | ORAL | Status: DC | PRN

## 2017-03-17 MED ORDER — INSULIN ASPART 100 UNIT/ML ~~LOC~~ SOLN
0.0000 [IU] | Freq: Three times a day (TID) | SUBCUTANEOUS | Status: DC
  Administered 2017-03-18: 8 [IU] via SUBCUTANEOUS
  Administered 2017-03-18: 3 [IU] via SUBCUTANEOUS
  Administered 2017-03-18: 8 [IU] via SUBCUTANEOUS
  Administered 2017-03-19: 5 [IU] via SUBCUTANEOUS
  Administered 2017-03-19: 3 [IU] via SUBCUTANEOUS
  Administered 2017-03-19: 5 [IU] via SUBCUTANEOUS
  Administered 2017-03-20: 8 [IU] via SUBCUTANEOUS
  Administered 2017-03-20 (×2): 5 [IU] via SUBCUTANEOUS

## 2017-03-17 MED ORDER — STROKE: EARLY STAGES OF RECOVERY BOOK
Freq: Once | Status: AC
  Administered 2017-03-17: 20:00:00
  Filled 2017-03-17 (×2): qty 1

## 2017-03-17 MED ORDER — INSULIN ASPART 100 UNIT/ML ~~LOC~~ SOLN
0.0000 [IU] | Freq: Every day | SUBCUTANEOUS | Status: DC
  Administered 2017-03-18 – 2017-03-19 (×2): 3 [IU] via SUBCUTANEOUS

## 2017-03-17 MED ORDER — ACETAMINOPHEN 650 MG RE SUPP: 650.0000 mg | RECTAL | Status: DC | PRN

## 2017-03-17 MED ORDER — RISAQUAD PO CAPS
1.0000 | ORAL_CAPSULE | Freq: Every day | ORAL | Status: DC
  Administered 2017-03-18 – 2017-03-20 (×3): 1 via ORAL
  Filled 2017-03-17 (×3): qty 1

## 2017-03-17 MED ORDER — FUROSEMIDE 20 MG PO TABS: 20.0000 mg | ORAL_TABLET | ORAL | Status: DC

## 2017-03-17 MED ORDER — LISINOPRIL 20 MG PO TABS
20.0000 mg | ORAL_TABLET | Freq: Every day | ORAL | Status: DC
  Administered 2017-03-18 – 2017-03-20 (×3): 20 mg via ORAL
  Filled 2017-03-17 (×3): qty 1

## 2017-03-17 MED ORDER — IOPAMIDOL (ISOVUE-370) INJECTION 76%
50.0000 mL | Freq: Once | INTRAVENOUS | Status: AC | PRN
  Administered 2017-03-17: 50 mL via INTRAVENOUS

## 2017-03-17 MED ORDER — METOPROLOL SUCCINATE ER 25 MG PO TB24
50.0000 mg | ORAL_TABLET | Freq: Every day | ORAL | Status: DC
  Administered 2017-03-18 – 2017-03-20 (×3): 50 mg via ORAL
  Filled 2017-03-17 (×3): qty 2

## 2017-03-17 MED ORDER — ASPIRIN 325 MG PO TABS
325.0000 mg | ORAL_TABLET | Freq: Every day | ORAL | Status: DC
  Administered 2017-03-17 – 2017-03-18 (×2): 325 mg via ORAL
  Filled 2017-03-17 (×2): qty 1

## 2017-03-17 MED ORDER — SENNOSIDES-DOCUSATE SODIUM 8.6-50 MG PO TABS: 1.0000 | ORAL_TABLET | Freq: Every evening | ORAL | Status: DC | PRN

## 2017-03-17 MED ORDER — LORATADINE 10 MG PO TABS
10.0000 mg | ORAL_TABLET | Freq: Every day | ORAL | Status: DC
  Administered 2017-03-17 – 2017-03-20 (×4): 10 mg via ORAL
  Filled 2017-03-17 (×4): qty 1

## 2017-03-17 MED ORDER — PROBIOTIC DAILY PO CAPS: 1.0000 | ORAL_CAPSULE | Freq: Every day | ORAL | Status: DC

## 2017-03-17 MED ORDER — ACETAMINOPHEN 325 MG PO TABS: 650.0000 mg | ORAL_TABLET | ORAL | Status: DC | PRN

## 2017-03-17 MED ORDER — FUROSEMIDE 40 MG PO TABS
40.0000 mg | ORAL_TABLET | Freq: Every day | ORAL | Status: DC
  Administered 2017-03-18 – 2017-03-20 (×3): 40 mg via ORAL
  Filled 2017-03-17 (×3): qty 1

## 2017-03-17 NOTE — Progress Notes (Addendum)
Pt admitted to the unit from ED; pt A&O x4; MAE x4; VSS; telemetry applied and verified with CCMD: NT called to second verify. Pt NIH 2 d/t slight slurred speech and asymmetrical face when speaking. Pt skin clean, dry and intact with no open wounds or pressure ulcer noted. Pt oriented to the unit and room; fall/safety precaution and prevention education completed with pt and pt voices understanding. Bed alarm on; call light within reach and family at bedside. Will report off to oncoming RN. Delia Heady RN

## 2017-03-17 NOTE — ED Notes (Signed)
LSN 03/14/17

## 2017-03-17 NOTE — ED Triage Notes (Signed)
Pt. Stateed, I got up on July 2 and I noticed I had slurred speech, and both legs are weak.  It sorted went away and came back again.  Same symptoms. Pt. Alert and oriented x 4. Slurred speech noted,  No arm drift. Equal grips. Equal smile.

## 2017-03-17 NOTE — H&P (Signed)
History and Physical    Laurie [name] DXI:[id number] DOB: [birth year] DOA: 03/17/2017  PCP: Marletta Lor, MD   Patient coming from: Home  Chief Complaint: slurred speech since July 2  HPI: Laurie [name] is a 71 y.o. woman with a history of CAD S/P CABG, PPM implant for complete heart block, Uncontrolled Type 2 DM with neuropathy, Bell's Palsy, HTN, and chronic systolic heart failure who presents to the ED for evaluation of slurred speech.  Symptoms started on July 2nd.  She woke up and noticed difficulty forming her words, but she did not have word finding difficulty.  No difficulty swallowing.  No coughing or choking with eating.  She has had bilateral leg weakness and the feeling that she cannot stand up for a long time, but she denies one side being weaker than the other.  She has been unsteady on her feet.  She reports subacute low back pain.  No falls.  No trauma.  No headache, vision disturbance, chest pain, shortness of breath, nausea, or vomiting.  ED Course: Troponin 0.03.  BUN 23 creatinine 0.89.  EKG V-paced.  Head CT shows atrophy, age indeterminate infarct to the left mid pons.  Neurology consulted.  Hospitalist asked to admit.  Review of Systems: As per HPI otherwise 10 systems reviewed and negative.   Past Medical History:  Diagnosis Date  . Arthritis    "hands" (11/05/2015)  . Bell's palsy 01/05/2010  . CAD (coronary artery disease)    Cath  07/21/1999  mild ostial, 80% stenosis proximal LAD, 90% stenosis proximal Diag 1, 95% stenosis proximal OM 1, 80% stenosis proximal RCA, 95% stenosis mid RCA    CABG 07/22/99  LIMA to LAD, SVG to dx, SVG to OM, SVG to PDA  Dr. Carmin Muskrat 2012 no ischemia EF 67%   . CHF (congestive heart failure) ([place]) dx'd 10/17/2015  . Cystitis   . Heart murmur   . Hepatitis   . HIstory of Bell's palsy    October 2012   . Hyperlipidemia    Intolerance to several statins   . Hypertension   . Hypertensive heart disease without CHF    . Multinodular goiter   . Obesity (BMI 30-39.9)   . OSA on CPAP    "wear mask sometimes" (11/05/2015)  . Pacemaker   . Peripheral neuropathy   . Seasonal allergies   . Thyromegaly    mild  . Type II diabetes mellitus ([place])     Past Surgical History:  Procedure Laterality Date  . BI-VENTRICULAR PACEMAKER UPGRADE  11/05/2015   "upgraded my pacemaker"  . BIOPSY THYROID  05/2010   percutaneous  . CARDIAC CATHETERIZATION N/A 10/21/2015   Procedure: Left Heart Cath and Cors/Grafts Angiography;  Surgeon: Belva Crome, MD;  Location: [place] [facility];  Service: Cardiovascular;  Laterality: N/A;  . CARPAL TUNNEL RELEASE    . CATARACT EXTRACTION W/ INTRAOCULAR LENS  IMPLANT, BILATERAL Right 01/06/2015  . CATARACT EXTRACTION W/ INTRAOCULAR LENS IMPLANT Left 12/16/2014  . CORONARY ARTERY BYPASS GRAFT  2000  . CYSTECTOMY Right    "middle finger"  . DENTAL IMPLANTS    . EP IMPLANTABLE DEVICE N/A 11/05/2015   Procedure: BiV Upgrade;  Surgeon: Deboraha Sprang, MD;  Location: [facility];  Service: Cardiovascular;  Laterality: N/A;  . HEEL SPUR EXCISION Left    "& clipped a tendon that went thru bottom of my foot"  . LEAD REVISION N/A 10/25/2012   Procedure: LEAD  REVISION;  Surgeon: Evans Lance, MD;  Location: [facility] CATH LAB;  Service: Cardiovascular;  Laterality: N/A;  . LEFT HEART CATHETERIZATION WITH CORONARY ANGIOGRAM N/A 10/23/2012   Procedure: LEFT HEART CATHETERIZATION WITH CORONARY ANGIOGRAM;  Surgeon: Jacolyn Reedy, MD;  Location: [facility] CATH LAB;  Service: Cardiovascular;  Laterality: N/A;  . PERMANENT PACEMAKER INSERTION N/A 10/23/2012   Procedure: PERMANENT PACEMAKER INSERTION;  Surgeon: Deboraha Sprang, MD;  Location: [facility] CATH LAB;  Service: Cardiovascular;  Laterality: N/A;  . TEMPORARY PACEMAKER INSERTION N/A 10/22/2012   Procedure: TEMPORARY PACEMAKER INSERTION;  Surgeon: Troy Sine, MD;  Location: [facility] CATH LAB;  Service: Cardiovascular;  Laterality: N/A;  . TUBAL LIGATION        reports that she has never smoked. She has never used smokeless tobacco. She reports that she does not drink alcohol or use drugs.  She is married.  She has two children.  Allergies  Allergen Reactions  . Statins Other (See Comments)    Make legs ache  . Tricor [Fenofibrate]     Leg pain  . Latex     Area gets red and itchy  . Tape Rash    Heart leads break out the skin    Family History  Problem Relation Age of Onset  . Diabetes Father   . Heart disease Father   . Diabetes Mother   . Diabetes Other        Siblings and 2 children  . Heart disease Other        CAD  . Colon cancer Neg Hx   . Colon polyps Neg Hx      Prior to Admission medications   Medication Sig Start Date End Date Taking? Authorizing Provider  cholecalciferol (VITAMIN D) 1000 UNITS tablet Take 2,000 Units by mouth daily.   Yes [provider]  ACCU-CHEK FASTCLIX LANCETS MISC USE  TO CHECK BLOOD SUGAR FOUR TIMES DAILY 07/20/16   Renato Shin, MD  ACCU-CHEK SMARTVIEW test strip USE  TO CHECK BLOOD SUGAR FOUR TIMES DAILY 10/05/16   Renato Shin, MD  Alcohol Swabs (B-D SINGLE USE SWABS REGULAR) PADS Use 8 daily for testing blood sugar and insulin injections. 09/12/14   Renato Shin, MD  AMBULATORY NON FORMULARY MEDICATION Take 1 capsule by mouth daily. Omega Q Plus - Take 1 capsule once daily    [provider]  aspirin EC 81 MG EC tablet Take 1 tablet (81 mg total) by mouth daily. 10/24/12   Jacolyn Reedy, MD  b complex vitamins tablet Take 1 tablet by mouth daily.    [provider]  BD INSULIN SYRINGE ULTRAFINE 31G X 15/64" 1 ML MISC USE 10 TIMES A DAY WITH INSULIN AS DIRECTED 12/08/16   Renato Shin, MD  beta carotene w/minerals (OCUVITE) tablet Take 1 tablet by mouth daily.    [provider]  Blood Glucose Calibration (ACCU-CHEK SMARTVIEW CONTROL) LIQD Use to calibrate blood glucose meter. 09/12/14   Renato Shin, MD  Fexofenadine HCl [facility] ALLERGY PO) Take  by mouth.    [provider]  furosemide (LASIX) 40 MG tablet Take 2 tablets by mouth every morning and 1 tablet by mouth every evening. Take additional 1 tablet by mouth for wt gain of 3 lbs or more. Patient taking differently: Take 20-40 mg by mouth every morning. 40 mg in the morning and 20 mg in the evening and MAY ALSO TAKE AN ADDITIONAL 20 MG FOR A WEIGHT GAIN OF 3 POUNDS OR MORE 03/12/16  Deboraha Sprang, MD  lisinopril (PRINIVIL,ZESTRIL) 20 MG tablet  05/28/16   [provider]  metoprolol succinate (TOPROL-XL) 50 MG 24 hr tablet Take 1 tablet (50 mg total) by mouth daily. Take with or immediately following a meal. 10/22/15   Jacolyn Reedy, MD  nitroGLYCERIN (NITROSTAT) 0.4 MG SL tablet Place 0.4 mg under the tongue as needed.  06/22/16   [provider]  NOVOLIN R 100 UNIT/ML injection 3 times a day (just before each meal) 170-170-160 units. 02/04/17   Renato Shin, MD  potassium chloride SA (KLOR-CON M20) 20 MEQ tablet Take 1 tablet (20 mEq total) by mouth daily. 10/22/15   Jacolyn Reedy, MD  Probiotic Product (PROBIOTIC DAILY) CAPS Take 1 capsule by mouth daily.    [provider]  psyllium (METAMUCIL) 58.6 % powder Take 1 packet by mouth 3 (three) times daily.    [provider]  sodium chloride (OCEAN) 0.65 % nasal spray Place 1 spray into the nose daily as needed for congestion.     [provider]    Physical Exam: Vitals:   03/17/17 1445 03/17/17 1500 03/17/17 1515 03/17/17 1530  BP: 137/83 (!) 120/51 (!) 118/55 127/61  Pulse: 64 (!) 58 65 64  Resp:      Temp:      TempSrc:      SpO2: 96% 94% 95% 95%  Weight:      Height:          Constitutional: NAD, calm, comfortable, speech is slurred but no word finding difficulty, very pleasant Vitals:   03/17/17 1445 03/17/17 1500 03/17/17 1515 03/17/17 1530  BP: 137/83 (!) 120/51 (!) 118/55 127/61  Pulse: 64 (!) 58 65 64  Resp:      Temp:      TempSrc:      SpO2: 96%  94% 95% 95%  Weight:      Height:       Eyes: PERRL, lids and conjunctivae normal ENMT: Mucous membranes are moist. Posterior pharynx clear of any exudate or lesions. Normal dentition.  Neck: normal appearance, supple, no masses Respiratory: clear to auscultation bilaterally, no wheezing, no crackles. Normal respiratory effort. No accessory muscle use.  Cardiovascular: Normal rate, regular rhythm, no murmurs / rubs / gallops. No extremity edema. 2+ pedal pulses.  GI: abdomen is mildly distended but nontender.  Bowel sounds are present. Musculoskeletal:  No joint deformity in upper and lower extremities. Good ROM, no contractures. Normal muscle tone.  Skin: no rashes, warm and dry Neurologic: Subtle facial droop on the left related to history of Bell's Palsy.  Otherwise, CN 2-12 grossly intact. Sensation intact, Strength symmetric bilaterally, 5/5.  I do not find significant RUE weakness. Psychiatric: Normal judgment and insight. Alert and oriented x 3. Normal mood.     Labs on Admission: I have personally reviewed following labs and imaging studies  CBC:  Recent Labs Lab 03/17/17 1220 03/17/17 1232  WBC 5.7  --   NEUTROABS 2.9  --   HGB 12.4 12.2  HCT 37.8 36.0  MCV 95.2  --   PLT 194  --    Basic Metabolic Panel:  Recent Labs Lab 03/17/17 1220 03/17/17 1232  NA 139 141  K 3.8 3.8  CL 107 107  CO2 24  --   GLUCOSE 182* 179*  BUN 23* 25*  CREATININE 0.89 0.70  CALCIUM 9.5  --    GFR: Estimated Creatinine Clearance: 71 mL/min (by C-G formula based on SCr of  0.7 mg/dL). Liver Function Tests:  Recent Labs Lab 03/17/17 1220  AST 30  ALT 31  ALKPHOS 65  BILITOT 0.4  PROT 6.9  ALBUMIN 3.7   Coagulation Profile:  Recent Labs Lab 03/17/17 1220  INR 0.94   CBG:  Recent Labs Lab 03/17/17 1213  GLUCAP 175*    Radiological Exams on Admission: Ct Head Wo Contrast  Result Date: 03/17/2017 CLINICAL DATA:  Slurred speech with a EXAM: CT HEAD WITHOUT  CONTRAST TECHNIQUE: Contiguous axial images were obtained from the base of the skull through the vertex without intravenous contrast. COMPARISON:  October 21, 2012 FINDINGS: Brain: There is mild diffuse atrophy. There is no mass, hemorrhage, extra-axial fluid collection, or midline shift. There is patchy small vessel disease throughout the centra semiovale bilaterally. There is an age uncertain focal infarct in the posterior, superior right centrum semiovale. There is small vessel disease in the mid pons bilaterally in the basilar perforator distribution with focal age uncertain infarct in the mid left pons. There is a prior focal infarct in the posterior right the mid this. Vascular: There is no demonstrable hyperdense vessel. There is vascular calcification in each carotid siphon region as well as in each distal vertebral artery. Skull: The bony calvarium appears intact. Sinuses/Orbits: There is mucosal thickening in several ethmoid air cells bilaterally. Other paranasal sinuses are clear. Orbits appear symmetric bilaterally. Other: Mastoid air cells are clear. IMPRESSION: 1. Atrophy with periventricular small vessel disease. Small vessel disease also noted in the mid pons regions. 2. Age uncertain infarcts in the left mid pons in the posterior, superior right centrum semiovale. 3.  No mass, hemorrhage, or extra-axial fluid collection. 4.  Multiple foci of arterial vascular calcification. 5.  Mild mucosal thickening in several ethmoid air cells. Electronically Signed   By: Lowella Grip III M.D.   On: 03/17/2017 12:52    EKG: Independently reviewed. V-paced.  Assessment/Plan Active Problems:   Insulin controlled diabetes mellitus  with complications of neuropathy and vascular disease   CAD (coronary artery disease)   HIstory of Bell's palsy   Hyperlipidemia   Obesity (BMI 30-39.9)   Cardiac pacemaker  ([employer])   S/P CABG (coronary artery bypass graft)   Slurred speech   Stroke-like symptoms       Slurred speech concerning for acute CVA --Neurology consult appreciated --CTA head and neck; patient cannot have MRI because she has a PPM --Fasting lipid panel and A1c --Complete echo in the AM --Full strength aspirin for now  IDDM --SSI coverage AC/HS  CAD --ASA, BB, ACE-I.  She has an allergy to statins.   DVT prophylaxis: SCDs Code Status: FULL Family Communication: Husband and daughter present in the ED at time of admission. Disposition Plan: To be determined. Consults called: Neurology Admission status: Place in observation with telemetry monitoring.   TIME SPENT: 50 minutes   Eber Jones MD [facility] Pager [phone]  If 7PM-7AM, please contact night-coverage [email or web address] Password [email or web address]  03/17/2017, 4:14 PM

## 2017-03-17 NOTE — Consult Note (Signed)
Neurology Consultation Reason for Consult: Right-sided weakness Referring Physician: Tyrone Nine, D  CC: Right-sided weakness  History is obtained from: Patient  HPI: Laurie [name] is a 71 y.o. female  with a history of coronary artery disease, CHF who presents with right-sided weakness that started on the second. She states that she has noticed a change in her handwriting and had some difficulty walking since that time. Her speech got more slurred last night and therefore she laid down hoping it would get better. When she awoke, it was not better and therefore she presented to the emergency department today.   LKW: 7/2 tpa given?: no, out of window   ROS: A 14 point ROS was performed and is negative except as noted in the HPI.   Past Medical History:  Diagnosis Date  . Arthritis    "hands" (11/05/2015)  . Bell's palsy 01/05/2010  . CAD (coronary artery disease)    Cath  07/21/1999  mild ostial, 80% stenosis proximal LAD, 90% stenosis proximal Diag 1, 95% stenosis proximal OM 1, 80% stenosis proximal RCA, 95% stenosis mid RCA    CABG 07/22/99  LIMA to LAD, SVG to dx, SVG to OM, SVG to PDA  Dr. Carmin Muskrat 2012 no ischemia EF 67%   . CHF (congestive heart failure) ([place]) dx'd 10/17/2015  . Cystitis   . Heart murmur   . Hepatitis   . HIstory of Bell's palsy    October 2012   . Hyperlipidemia    Intolerance to several statins   . Hypertension   . Hypertensive heart disease without CHF   . Multinodular goiter   . Obesity (BMI 30-39.9)   . OSA on CPAP    "wear mask sometimes" (11/05/2015)  . Pacemaker   . Peripheral neuropathy   . Seasonal allergies   . Thyromegaly    mild  . Type II diabetes mellitus ([facility])      Family History  Problem Relation Age of Onset  . Diabetes Father   . Heart disease Father   . Diabetes Mother   . Diabetes Other        Siblings and 2 children  . Heart disease Other        CAD  . Colon cancer Neg Hx   . Colon polyps Neg Hx      Social  History:  reports that she has never smoked. She has never used smokeless tobacco. She reports that she does not drink alcohol or use drugs.   Exam: Current vital signs: BP (!) 117/49   Pulse 65   Temp 98.2 F (36.8 C) (Oral)   Resp 18   Ht 5' 2.5" (1.588 m)   Wt 94.8 kg (209 lb)   SpO2 96%   BMI 37.62 kg/m  Vital signs in last 24 hours: Temp:  [98.2 F (36.8 C)] 98.2 F (36.8 C) (07/05 1205) Pulse Rate:  [58-92] 65 (07/05 1700) Resp:  [18] 18 (07/05 1205) BP: (117-164)/(49-83) 117/49 (07/05 1730) SpO2:  [94 %-97 %] 96 % (07/05 1700) Weight:  [94.8 kg (209 lb)] 94.8 kg (209 lb) (07/05 1205)   Physical Exam  Constitutional: Appears well-developed and well-nourished.  Psych: Affect appropriate to situation Eyes: No scleral injection HENT: No OP obstrucion Head: Normocephalic.  Cardiovascular: Normal rate and regular rhythm.  Respiratory: Effort normal and breath sounds normal to anterior ascultation GI: Soft.  No distension. There is no tenderness.  Skin: WDI  Neuro: Mental Status: Patient is awake, alert, oriented to  person, place, month, year, and situation. Patient is able to give a clear and coherent history. No signs of aphasia or neglect Cranial Nerves: II: Visual Fields are full. Pupils are equal, round, and reactive to light.   III,IV, VI: EOMI without ptosis or diploplia.  V: Facial sensation is symmetric to temperature VII: Facial movement with right-sided weakness VIII: hearing is intact to voice X: Uvula is difficult to visualize, but appears to deviate XI: Shoulder shrug is symmetric. XII: tongue is midline without atrophy or fasciculations.  Motor: Tone is normal. Bulk is normal. She has mild 4+/5 weakness of the right arm and leg, 5/5 on the left Sensory: Sensation is symmetric to light touch and temperature in the arms and legs with the exception of mild decreased to light touch over the dorsum of the left foot Deep Tendon Reflexes: 2+ and  symmetric in the biceps and patellae.  Cerebellar: FNF and HKS are intact bilaterally   I have reviewed labs in epic and the results pertinent to this consultation are: CMP-unremarkable  I have reviewed the images obtained: CT head- age-indeterminate infarct in the left pons  Impression: 71 year old female with new right-sided weakness. I do question with the left-sided pontine infarct is new, as it would explain her symptoms. In any case, I do strongly suspect that this is due to an ischemic infarct.  Recommendations: 1. HgbA1c, fasting lipid panel 2. MRI, MRA  of the brain without contrast 3. Frequent neuro checks 4. Echocardiogram 5. Carotid dopplers 6. Prophylactic therapy-Antiplatelet med: Aspirin - dose 325mg  PO or 300mg  PR 7. Risk factor modification 8. Telemetry monitoring 9. PT consult, OT consult, Speech consult 10. please page stroke NP  Or  PA  Or MD  from 8am -4 pm as this patient will be followed by the stroke team at this point.   You can look them up on [email or web address]    Roland Rack, MD [facility] [phone]  If 7pm- 7am, please page neurology on call as listed in [place].

## 2017-03-17 NOTE — ED Notes (Signed)
Report attempted 

## 2017-03-17 NOTE — ED Notes (Signed)
Patient transported to X-ray 

## 2017-03-17 NOTE — ED Provider Notes (Signed)
[facility] DEPT Provider Note   CSN: [id number] Arrival date & time: 03/17/17  1141     History   Chief Complaint Chief Complaint  Patient presents with  . Aphasia  . Stroke Symptoms    HPI Laurie [name] is a 71 y.o. female.  71 yo female presents with slurred speech beginning yesterday evening July 4th people noticed she was "talking funny and slurring my words".  She reports on July 3rd she had similar slurred speech with some weakness that resolved on it's own the next morning.  She denies any headache, vision changes, chest pain or shortness of breath.  Additionally, she denies any vision changes, weakness and only reports the slurred speech as the reason she came in.        Past Medical History:  Diagnosis Date  . Arthritis    "hands" (11/05/2015)  . Bell's palsy 01/05/2010  . CAD (coronary artery disease)    Cath  07/21/1999  mild ostial, 80% stenosis proximal LAD, 90% stenosis proximal Diag 1, 95% stenosis proximal OM 1, 80% stenosis proximal RCA, 95% stenosis mid RCA    CABG 07/22/99  LIMA to LAD, SVG to dx, SVG to OM, SVG to PDA  Dr. Carmin Muskrat 2012 no ischemia EF 67%   . CHF (congestive heart failure) ([place]) dx'd 10/17/2015  . Cystitis   . Heart murmur   . Hepatitis   . HIstory of Bell's palsy    October 2012   . Hyperlipidemia    Intolerance to several statins   . Hypertension   . Hypertensive heart disease without CHF   . Multinodular goiter   . Obesity (BMI 30-39.9)   . OSA on CPAP    "wear mask sometimes" (11/05/2015)  . Pacemaker   . Peripheral neuropathy   . Seasonal allergies   . Thyromegaly    mild  . Type II diabetes mellitus [facility])     Patient Active Problem List   Diagnosis Date Noted  . Type 2 diabetes mellitus with diabetic neuropathy, unspecified ([place]) 01/21/2017  . NICM (nonischemic cardiomyopathy) ([place]) 11/05/2015  . Chronic systolic CHF (congestive heart failure) ([place]) 10/22/2015  . Complete heart block ([place])   . S/P CABG  (coronary artery bypass graft)   . Cardiac pacemaker  ([employer]) 10/23/2012  . Hyperlipidemia 04/18/2012  . Obesity (BMI 30-39.9)   . Sleep apnea   . Peripheral neuropathy   . HIstory of Bell's palsy   . Multinodular goiter   . 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 Hypertensive heart disease without CHF   . CAD (coronary artery disease)     Past Surgical History:  Procedure Laterality Date  . BI-VENTRICULAR PACEMAKER UPGRADE  11/05/2015   "upgraded my pacemaker"  . BIOPSY THYROID  05/2010   percutaneous  . CARDIAC CATHETERIZATION N/A 10/21/2015   Procedure: Left Heart Cath and Cors/Grafts Angiography;  Surgeon: Belva Crome, MD;  Location: [place] CV LAB;  Service: Cardiovascular;  Laterality: N/A;  . CARPAL TUNNEL RELEASE    . CATARACT EXTRACTION W/ INTRAOCULAR LENS  IMPLANT, BILATERAL Right 01/06/2015  . CATARACT EXTRACTION W/ INTRAOCULAR LENS IMPLANT Left 12/16/2014  . CORONARY ARTERY BYPASS GRAFT  2000  . CYSTECTOMY Right    "middle finger"  . DENTAL IMPLANTS    . EP IMPLANTABLE DEVICE N/A 11/05/2015   Procedure: BiV Upgrade;  Surgeon: Deboraha Sprang, MD;  Location: [place] [facility] LAB;  Service: Cardiovascular;  Laterality: N/A;  . HEEL SPUR EXCISION Left    "& clipped a tendon that went thru bottom of my foot"  . LEAD REVISION N/A 10/25/2012   Procedure: LEAD REVISION;  Surgeon: Evans Lance, MD;  Location[facility] C[facility] LAB;  Service: Cardiovascular;  Laterality: N/A;  . LEFT HEART CATHETERIZATION WITH CORONARY ANGIOGRAM N/A 10/23/2012   Procedure: LEFT HEART CATHETERIZATION WITH CORONARY ANGIOGRAM;  Surgeon: Jacolyn Reedy, MD;  Location: [facility] CATH LAB;  Service: Cardiovascular;  Laterality: N/A;  . PERMANENT PACEMAKER INSERTION N/A 10/23/2012   Procedure: PERMANENT PACEMAKER INSERTION;  Surgeon: Deboraha Sprang, MD;  Location: [facility] CATH LAB;  Service: Cardiovascular;  Laterality: N/A;  . TEMPORARY PACEMAKER INSERTION N/A  10/22/2012   Procedure: TEMPORARY PACEMAKER INSERTION;  Surgeon: Troy Sine, MD;  Location: [facility] CATH LAB;  Service: Cardiovascular;  Laterality: N/A;  . TUBAL LIGATION      OB History    No data available       Home Medications    Prior to Admission medications   Medication Sig Start Date End Date Taking? Authorizing Provider  ACCU-CHEK FASTCLIX LANCETS MISC USE  TO CHECK BLOOD SUGAR FOUR TIMES DAILY 07/20/16   Renato Shin, MD  ACCU-CHEK SMARTVIEW test strip USE  TO CHECK BLOOD SUGAR FOUR TIMES DAILY 10/05/16   Renato Shin, MD  Alcohol Swabs (B-D SINGLE USE SWABS REGULAR) PADS Use 8 daily for testing blood sugar and insulin injections. 09/12/14   Renato Shin, MD  AMBULATORY NON FORMULARY MEDICATION Take 1 capsule by mouth daily. Omega Q Plus - Take 1 capsule once daily    [provider]  aspirin EC 81 MG EC tablet Take 1 tablet (81 mg total) by mouth daily. 10/24/12   Jacolyn Reedy, MD  b complex vitamins tablet Take 1 tablet by mouth daily.    [provider]  BD INSULIN SYRINGE ULTRAFINE 31G X 15/64" 1 ML MISC USE 10 TIMES A DAY WITH INSULIN AS DIRECTED 12/08/16   Renato Shin, MD  beta carotene w/minerals (OCUVITE) tablet Take 1 tablet by mouth daily.    [provider]  Blood Glucose Calibration (ACCU-CHEK SMARTVIEW CONTROL) LIQD Use to calibrate blood glucose meter. 09/12/14   Renato Shin, MD  cholecalciferol (VITAMIN D) 1000 UNITS tablet Take 2,000 Units by mouth daily.    [provider]  Fexofenadine HCl (ALLEGRA ALLERGY PO) Take by mouth.    [provider]  furosemide (LASIX) 40 MG tablet Take 2 tablets by mouth every morning and 1 tablet by mouth every evening. Take additional 1 tablet by mouth for wt gain of 3 lbs or more. 03/12/16   Deboraha Sprang, MD  lisinopril (PRINIVIL,ZESTRIL) 20 MG tablet  05/28/16   [provider]  metoprolol succinate (TOPROL-XL) 50 MG 24 hr tablet Take 1 tablet (50 mg total) by mouth  daily. Take with or immediately following a meal. 10/22/15   Jacolyn Reedy, MD  nitroGLYCERIN (NITROSTAT) 0.4 MG SL tablet Place 0.4 mg under the tongue as needed.  06/22/16   [provider]  NOVOLIN R 100 UNIT/ML injection 3 times a day (just before each meal) 170-170-160 units. 02/04/17   Renato Shin, MD  potassium chloride SA (KLOR-CON M20) 20 MEQ tablet Take 1 tablet (20 mEq total) by mouth daily. 10/22/15   Jacolyn Reedy, MD  Probiotic Product (PROBIOTIC DAILY) CAPS Take 1 capsule by mouth daily.    [provider]  psyllium (METAMUCIL) 58.6 % powder Take  1 packet by mouth 3 (three) times daily.    [provider]  sodium chloride (OCEAN) 0.65 % nasal spray Place 1 spray into the nose as needed for congestion.    [provider]    Family History Family History  Problem Relation Age of Onset  . Diabetes Father   . Heart disease Father   . Diabetes Mother   . Diabetes Other        Siblings and 2 children  . Heart disease Other        CAD  . Colon cancer Neg Hx   . Colon polyps Neg Hx     Social History Social History  Substance Use Topics  . Smoking status: Never Smoker  . Smokeless tobacco: Never Used  . Alcohol use No     Allergies   Statins; Tricor [fenofibrate]; and Latex   Review of Systems Review of Systems  Constitutional: Negative for chills and fever.  HENT: Negative for ear pain and sore throat.   Eyes: Negative for pain and visual disturbance.  Respiratory: Negative for cough and shortness of breath.   Cardiovascular: Negative for chest pain and palpitations.  Gastrointestinal: Negative for abdominal pain and vomiting.  Genitourinary: Negative for dysuria and hematuria.  Musculoskeletal: Negative for arthralgias and back pain.  Skin: Negative for color change and rash.  Neurological: Positive for speech difficulty. Negative for seizures, syncope and weakness.  Psychiatric/Behavioral: Negative for agitation,  behavioral problems and confusion.  All other systems reviewed and are negative.    Physical Exam Updated Vital Signs BP (!) 159/80   Pulse 92   Temp 98.2 F (36.8 C) (Oral)   Resp 18   Ht 5' 2.5" (1.588 m)   Wt 94.8 kg (209 lb)   SpO2 96%   BMI 37.62 kg/m   Physical Exam  Constitutional: She is oriented to person, place, and time. She appears well-developed and well-nourished. No distress.  HENT:  Head: Normocephalic and atraumatic.  Eyes: Conjunctivae are normal.  Neck: Neck supple.  Cardiovascular: Normal rate and regular rhythm.   No murmur heard. Pulmonary/Chest: Effort normal and breath sounds normal. No respiratory distress.  Abdominal: Soft. There is no tenderness.  Musculoskeletal: She exhibits no edema.  Neurological: She is alert and oriented to person, place, and time. She has normal reflexes. No cranial nerve deficit or sensory deficit. She displays a negative Romberg sign. Coordination and gait normal. GCS eye subscore is 4. GCS verbal subscore is 5. GCS motor subscore is 6.  Very mild weakness in left upper extremity when compared to the right side but both 4+ strength in bilateral upper and lower extremities. R sided palate and tongue elevation seen when pt opens mouth  Skin: Skin is warm and dry.  Psychiatric: She has a normal mood and affect.  Nursing note and vitals reviewed.    ED Treatments / Results  Labs (all labs ordered are listed, but only abnormal results are displayed) Labs Reviewed  COMPREHENSIVE METABOLIC PANEL - Abnormal; Notable for the following:       Result Value   Glucose, Bld 182 (*)    BUN 23 (*)    All other components within normal limits  CBG MONITORING, ED - Abnormal; Notable for the following:    Glucose-Capillary 175 (*)    All other components within normal limits  I-STAT CHEM 8, ED - Abnormal; Notable for the following:    BUN 25 (*)    Glucose, Bld 179 (*)  All other components within normal limits  PROTIME-INR    APTT  CBC  DIFFERENTIAL  I-STAT TROPOININ, ED    EKG  EKG Interpretation  Date/Time:  Thursday March 17 2017 12:04:30 EDT Ventricular Rate:  69 PR Interval:  194 QRS Duration: 116 QT Interval:  402 QTC Calculation: 430 R Axis:   -79 Text Interpretation:  Electronic ventricular pacemaker No significant change since last tracing Confirmed by Deno Etienne [id number]) on 03/17/2017 2:15:59 PM       Radiology Ct Head Wo Contrast  Result Date: 03/17/2017 CLINICAL DATA:  Slurred speech with a EXAM: CT HEAD WITHOUT CONTRAST TECHNIQUE: Contiguous axial images were obtained from the base of the skull through the vertex without intravenous contrast. COMPARISON:  October 21, 2012 FINDINGS: Brain: There is mild diffuse atrophy. There is no mass, hemorrhage, extra-axial fluid collection, or midline shift. There is patchy small vessel disease throughout the centra semiovale bilaterally. There is an age uncertain focal infarct in the posterior, superior right centrum semiovale. There is small vessel disease in the mid pons bilaterally in the basilar perforator distribution with focal age uncertain infarct in the mid left pons. There is a prior focal infarct in the posterior right the mid this. Vascular: There is no demonstrable hyperdense vessel. There is vascular calcification in each carotid siphon region as well as in each distal vertebral artery. Skull: The bony calvarium appears intact. Sinuses/Orbits: There is mucosal thickening in several ethmoid air cells bilaterally. Other paranasal sinuses are clear. Orbits appear symmetric bilaterally. Other: Mastoid air cells are clear. IMPRESSION: 1. Atrophy with periventricular small vessel disease. Small vessel disease also noted in the mid pons regions. 2. Age uncertain infarcts in the left mid pons in the posterior, superior right centrum semiovale. 3.  No mass, hemorrhage, or extra-axial fluid collection. 4.  Multiple foci of arterial vascular calcification. 5.   Mild mucosal thickening in several ethmoid air cells. Electronically Signed   By: Lowella Grip III M.D.   On: 03/17/2017 12:52    Procedures Procedures (including critical care time)  Medications Ordered in ED Medications - No data to display   Initial Impression / Assessment and Plan / ED Course  I have reviewed the triage vital signs and the nursing notes.  Pertinent labs & imaging results that were available during my care of the patient were reviewed by me and considered in my medical decision making (see chart for details).     Aphasia:  Ddx: TIA, Stroke  -Head CT results above shows multiple infarcts age indeterminate results above -MRI to further evaluate for possibility of new onset stroke -Admit pt to hospitalist service for further stroke workup   Final Clinical Impressions(s) / ED Diagnoses   Final diagnoses:  None    New Prescriptions New Prescriptions   No medications on file     Katherine Roan, MD 03/17/17 [place], [place], DO 03/18/17 [id number]

## 2017-03-18 ENCOUNTER — Observation Stay (HOSPITAL_COMMUNITY): Payer: Medicare HMO

## 2017-03-18 DIAGNOSIS — I6330 Cerebral infarction due to thrombosis of unspecified cerebral artery: Secondary | ICD-10-CM | POA: Diagnosis not present

## 2017-03-18 DIAGNOSIS — R4701 Aphasia: Secondary | ICD-10-CM

## 2017-03-18 LAB — RAPID URINE DRUG SCREEN, HOSP PERFORMED
AMPHETAMINES: NOT DETECTED
Barbiturates: NOT DETECTED
Benzodiazepines: NOT DETECTED
Cocaine: NOT DETECTED
OPIATES: NOT DETECTED
Tetrahydrocannabinol: NOT DETECTED

## 2017-03-18 LAB — GLUCOSE, CAPILLARY
GLUCOSE-CAPILLARY: 79 mg/dL (ref 65–99)
GLUCOSE-CAPILLARY: 275 mg/dL — AB (ref 65–99)
GLUCOSE-CAPILLARY: 251 mg/dL — AB (ref 65–99)
Glucose-Capillary: 191 mg/dL — ABNORMAL HIGH (ref 65–99)
Glucose-Capillary: 285 mg/dL — ABNORMAL HIGH (ref 65–99)

## 2017-03-18 LAB — LIPID PANEL
CHOLESTEROL: 183 mg/dL (ref 0–200)
HDL: 35 mg/dL — AB (ref >40)
LDL Cholesterol: 108 mg/dL — ABNORMAL HIGH (ref 0–99)
Total CHOL/HDL Ratio: 5.2 RATIO
Triglycerides: 201 mg/dL — ABNORMAL HIGH (ref <150)
VLDL: 40 mg/dL (ref 0–40)

## 2017-03-18 MED ORDER — PRAVASTATIN SODIUM 20 MG PO TABS
20.0000 mg | ORAL_TABLET | Freq: Every day | ORAL | Status: DC
  Administered 2017-03-18 – 2017-03-20 (×3): 20 mg via ORAL
  Filled 2017-03-18 (×3): qty 1

## 2017-03-18 MED ORDER — PERFLUTREN LIPID MICROSPHERE
1.0000 mL | INTRAVENOUS | Status: AC | PRN
  Administered 2017-03-18: 2 mL via INTRAVENOUS
  Filled 2017-03-18: qty 10

## 2017-03-18 MED ORDER — ASPIRIN EC 325 MG PO TBEC
325.0000 mg | DELAYED_RELEASE_TABLET | Freq: Every day | ORAL | Status: DC
  Administered 2017-03-19 – 2017-03-20 (×2): 325 mg via ORAL
  Filled 2017-03-18 (×2): qty 1

## 2017-03-18 NOTE — Evaluation (Signed)
Physical Therapy Evaluation Patient Details Name: Laurie [name] MRN: [id number] DOB: 1945-11-08 Today's Date: 03/18/2017   History of Present Illness  Pt is a 71 y/o female admitted secondary to slurred speech, likely due to an ischemic infarct per neuro. PMH including but not limited to Bell's Palsy, CAD, CHF, HTN, peripheral neuropathy, DM, CABG in 2000 and BiV implant in 2017.  Clinical Impression  Pt presented supine in bed with HOB elevated, awake and willing to participate in therapy session. Prior to admission, pt reported that she was independent with all functional mobility and ADLs. Pt ambulated in hallway with min guard for safety without use of an AD. Pt also successfully completed stair training with use of bilateral handrails and min guard for safety. PT will continue to follow acutely to ensure a safe d/c home.    Follow Up Recommendations No PT follow up    Equipment Recommendations  None recommended by PT    Recommendations for Other Services       Precautions / Restrictions Precautions Precautions: Fall Restrictions Weight Bearing Restrictions: No      Mobility  Bed Mobility Overal bed mobility: Modified Independent                Transfers Overall transfer level: Needs assistance Equipment used: None Transfers: Sit to/from Stand Sit to Stand: Supervision         General transfer comment: supervision for safety  Ambulation/Gait Ambulation/Gait assistance: Min guard Ambulation Distance (Feet): 150 Feet Assistive device: None Gait Pattern/deviations: Step-through pattern;Decreased step length - right;Decreased step length - left;Decreased stride length;Trunk flexed Gait velocity: decreased Gait velocity interpretation: Below normal speed for age/gender General Gait Details: slow, cautious gait without use of AD, occasionally reaching for handrails in hallway, min guard for safety  Stairs Stairs: Yes Stairs assistance: Min guard Stair  Management: Two rails;Alternating pattern;Forwards Number of Stairs: 2 General stair comments: no instability or LOB, min guard for safety  Wheelchair Mobility    Modified Rankin (Stroke Patients Only) Modified Rankin (Stroke Patients Only) Pre-Morbid Rankin Score: No symptoms Modified Rankin: Slight disability     Balance Overall balance assessment: Needs assistance Sitting-balance support: Feet supported;No upper extremity supported Sitting balance-Leahy Scale: Good Sitting balance - Comments: pt able to sit EOB with supervision   Standing balance support: During functional activity;No upper extremity supported Standing balance-Leahy Scale: Fair                               Pertinent Vitals/Pain Pain Assessment: No/denies pain    Home Living Family/patient expects to be discharged to:: Private residence Living Arrangements: Spouse/significant other;Children Available Help at Discharge: Family;Available 24 hours/day Type of Home: House Home Access: Stairs to enter Entrance Stairs-Rails: Left Entrance Stairs-Number of Steps: 3 Home Layout: One level Home Equipment: Shower seat;Cane - single point;Walker - 4 wheels      Prior Function Level of Independence: Independent               Hand Dominance   Dominant Hand: Right    Extremity/Trunk Assessment   Upper Extremity Assessment Upper Extremity Assessment: Overall WFL for tasks assessed    Lower Extremity Assessment Lower Extremity Assessment: Generalized weakness    Cervical / Trunk Assessment Cervical / Trunk Assessment: Kyphotic  Communication   Communication: Expressive difficulties  Cognition Arousal/Alertness: Awake/alert Behavior During Therapy: WFL for tasks assessed/performed Overall Cognitive Status: Impaired/Different from baseline Area of Impairment: Memory  Memory: Decreased short-term memory                General Comments       Exercises     Assessment/Plan    PT Assessment Patient needs continued PT services  PT Problem List Decreased balance;Decreased mobility;Decreased coordination;Decreased safety awareness       PT Treatment Interventions DME instruction;Gait training;Stair training;Functional mobility training;Therapeutic activities;Therapeutic exercise;Balance training;Neuromuscular re-education;Patient/family education    PT Goals (Current goals can be found in the Care Plan section)  Acute Rehab PT Goals Patient Stated Goal: return home PT Goal Formulation: With patient/family Time For Goal Achievement: 04/01/17 Potential to Achieve Goals: Good    Frequency Min 4X/week   Barriers to discharge        Co-evaluation               AM-PAC PT "6 Clicks" Daily Activity  Outcome Measure Difficulty turning over in bed (including adjusting bedclothes, sheets and blankets)?: None Difficulty moving from lying on back to sitting on the side of the bed? : None Difficulty sitting down on and standing up from a chair with arms (e.g., wheelchair, bedside commode, etc,.)?: None Help needed moving to and from a bed to chair (including a wheelchair)?: A Little Help needed walking in hospital room?: A Little Help needed climbing 3-5 steps with a railing? : A Little 6 Click Score: 21    End of Session Equipment Utilized During Treatment: Gait belt Activity Tolerance: Patient tolerated treatment well Patient left: in bed;with call bell/phone within reach;with bed alarm set;with family/visitor present Nurse Communication: Mobility status PT Visit Diagnosis: Other abnormalities of gait and mobility (R26.89);Other symptoms and signs involving the nervous system (R29.898)    Time: 9150-5697 PT Time Calculation (min) (ACUTE ONLY): 21 min   Charges:   PT Evaluation $PT Eval Moderate Complexity: 1 Procedure     PT G Codes:   PT G-Codes **NOT FOR INPATIENT CLASS** Functional Assessment Tool Used:  AM-PAC 6 Clicks Basic Mobility;Clinical judgement Functional Limitation: Mobility: Walking and moving around Mobility: Walking and Moving Around Current Status ([id number]): At least 20 percent but less than 40 percent impaired, limited or restricted Mobility: Walking and Moving Around Goal Status [phone]): 0 percent impaired, limited or restricted    [facility], PT, DPT [place] 03/18/2017, 10:21 AM

## 2017-03-18 NOTE — Progress Notes (Addendum)
STROKE TEAM PROGRESS NOTE  Laurie [name] is a 71 y.o. female with a history of coronary artery disease, 3rd degree heart block with implanted pacemaker, chronic systolic heart failure, heart murmur, hypertension, hyperlipidemia, multinodular goiter, OSA on CPAP, Type II diabetes mellitus, peripheral neuropathy, and left-sided Bell's palsy, who presented 03/17/2017 with right-sided weakness and slurred speech that started on 03/14/2017. She states that she has noticed a change in her handwriting and had some difficulty walking since that time. Her speech became more on the night of 03/16/2017 and she went to bed hoping is would improve.  When she awoke, her slurred speech persisted and she also felt weak in her knees.  The patient states she has not been wearing her CPAP because her mask will not seal well to her face.  The patient also reports poor compliance with her prescribed 81mg  aspirin because she claims it upsets her stomach, and was not aware that enteric coated aspirin is available. LKW: 03/14/2017  Patient was not administered IV t-PA secondary to presenting outside of the treatment window.  She was admitted to General Neurology for further evaluation and treatment.   SUBJECTIVE (INTERVAL HISTORY) Her husband is at the bedside.  The patient is awake, alert, and follows all commands appropriately. Still has mild right facial droop and right UE weakness.    OBJECTIVE Temp:  [97.6 F (36.4 C)-98.2 F (36.8 C)] 97.6 F (36.4 C) (07/06 1409) Pulse Rate:  [58-92] 59 (07/06 1409) Cardiac Rhythm: A-V Sequential paced;Bundle branch block (07/06 0701) Resp:  [17-19] 17 (07/06 1409) BP: (117-167)/(49-91) 128/51 (07/06 1409) SpO2:  [94 %-99 %] 99 % (07/06 1409)  CBC:   Recent Labs Lab 03/17/17 1220 03/17/17 1232  WBC 5.7  --   NEUTROABS 2.9  --   HGB 12.4 12.2  HCT 37.8 36.0  MCV 95.2  --   PLT 194  --     Basic Metabolic Panel:   Recent Labs Lab 03/17/17 1220 03/17/17 1232  NA  139 141  K 3.8 3.8  CL 107 107  CO2 24  --   GLUCOSE 182* 179*  BUN 23* 25*  CREATININE 0.89 0.70  CALCIUM 9.5  --     Lipid Panel:     Component Value Date/Time   CHOL 183 03/18/2017 0556   TRIG 201 (H) 03/18/2017 0556   HDL 35 (L) 03/18/2017 0556   CHOLHDL 5.2 03/18/2017 0556   VLDL 40 03/18/2017 0556   LDLCALC 108 (H) 03/18/2017 0556   HgbA1c:  Lab Results  Component Value Date   HGBA1C 8.0 02/04/2017   Urine Drug Screen:     Component Value Date/Time   LABOPIA NONE DETECTED 03/18/2017 0935   COCAINSCRNUR NONE DETECTED 03/18/2017 0935   LABBENZ NONE DETECTED 03/18/2017 0935   AMPHETMU NONE DETECTED 03/18/2017 0935   THCU NONE DETECTED 03/18/2017 0935   LABBARB NONE DETECTED 03/18/2017 0935    Alcohol Level No results found for: [place] I have personally reviewed the radiological images below and agree with the radiology interpretations.  Ct Angio Head and neck W Or Wo Contrast 03/17/2017 IMPRESSION: 1. No emergent large vessel occlusion. 2. Mild bilateral carotid bifurcation atherosclerosis without hemodynamically significant stenosis. 3. Moderate atherosclerotic calcification of the internal carotid arteries at the skullbase.  Dg Chest 2 View 03/17/2017 IMPRESSION: Cardiomegaly with mild pulmonary vascular congestion.  Ct Head Wo Contrast 03/17/2017 IMPRESSION: 1. Atrophy with periventricular small vessel disease. Small vessel disease also noted in the mid pons regions.  2. Age uncertain infarcts in the left mid pons in the posterior, superior right centrum semiovale. 3.  No mass, hemorrhage, or extra-axial fluid collection. 4.  Multiple foci of arterial vascular calcification. 5.  Mild mucosal thickening in several ethmoid air cells.    CT repeat pending  TTE pending   PHYSICAL EXAM  Temp:  [97.6 F (36.4 C)-98.2 F (36.8 C)] 97.6 F (36.4 C) (07/06 1409) Pulse Rate:  [59-81] 59 (07/06 1409) Resp:  [17-19] 17 (07/06 1409) BP: (117-167)/(49-91)  128/51 (07/06 1409) SpO2:  [94 %-99 %] 99 % (07/06 1409)  General - Well nourished, well developed, in no apparent distress.  Ophthalmologic - Sharp disc margins OU.   Cardiovascular - Regular rate and rhythm.  Mental Status -  Level of arousal and orientation to time, place, and person were intact. Language including expression, naming, repetition, comprehension was assessed and found intact. Fund of Knowledge was assessed and was intact.  Cranial Nerves II - XII - II - Visual field intact OU. III, IV, VI - Extraocular movements intact. V - Facial sensation intact bilaterally. VII - right mild facial droop. VIII - Hearing & vestibular intact bilaterally. X - Palate elevates symmetrically. XI - Chin turning & shoulder shrug intact bilaterally. XII - Tongue protrusion intact.  Motor Strength - The patient's strength was normal in all extremities except right hand mild dexterity difficulty and pronator drift was absent.  Bulk was normal and fasciculations were absent.   Motor Tone - Muscle tone was assessed at the neck and appendages and was normal.  Reflexes - The patient's reflexes were 1+ in all extremities and she had no pathological reflexes.  Sensory - Light touch, temperature/pinprick were assessed and were symmetrical.    Coordination - The patient had normal movements in the hands with no ataxia or dysmetria.  Tremor was absent.  Gait and Station - deferred   ASSESSMENT/PLAN [name] is a 71 y.o. female with history of  coronary artery disease, 3rd degree heart block with implanted pacemaker, chronic systolic heart failure, heart murmur, hypertension, hyperlipidemia, multinodular goiter, OSA on CPAP, Type II diabetes mellitus, peripheral neuropathy, and left-sided Bell's palsy presenting with right-sided weakness and slurred speech . She did not receive IV t-PA due to arriving outside of the treatment window.   Stroke: suspect left subcortical small infarct  likely due to small vessel disease source.   Resultant  Right facial droop and right hand weakness  CT head: chronic lacunar infarcts including left mid pons, right CS and right BG   MRI and MRA head: unable to obtain due to incompatible pacemaker  CTA head and neck - b/l siphon and left VA atherosclerosis  CT repeat in am pending  2D Echo  pending  Pacer interrogation - no atrial or ventricular arrhythmia seen  LDL 108  HgbA1c 8.0 in 01/2017, pending this admission  SCDs for VTE prophylaxis Diet heart healthy/carb modified Room service appropriate? Yes; Fluid consistency: Thin; Fluid restriction: 1500 mL Fluid  aspirin 81 mg daily prior to admission with self-reported poor compliance, now on aspirin 325 mg daily. Continue ASA EC 325 on discharge.  Patient counseled to be compliant with her antithrombotic medications  Ongoing aggressive stroke risk factor management  Therapy recommendations:  Outpatient SLP (if dysarthria persists)   Disposition:  pending  Hypertension  Stable  Permissive hypertension (OK if < 220/120) but gradually normalize in 5-7 days  Long-term BP goal normotensive  Hyperlipidemia  Home meds: none  LDL  108, goal < 70  Hx of statin intolerance to lipitor and crestor  Would try pravastatin 20mg  daily  Diabetes  HgbA1c 8.0 in 01/2017  HgbA1C pending this admission, goal < 7.0  Uncontrolled  CBG fluctuating  SSI  Close PCP follow up  Other Stroke Risk Factors  Advanced age  Obesity, Body mass index is 37.62 kg/m., recommend weight loss, diet and exercise as appropriate   Hx of stroke - on imaging  CAD s/p CABG  OSA, self-reports poor compliance with CPAP at home  Chronic systolic heart failure  Other Active Problems  Pacemaker-dependent 3rd-degree heart block  Hospital day # 0  Rosalin Hawking, MD PhD Stroke Neurology 03/18/2017 4:07 PM    To contact Stroke Continuity provider, please refer to [email or web address] After hours,  contact General Neurology

## 2017-03-18 NOTE — Progress Notes (Signed)
PROGRESS NOTE    Laurie [name]  ZOX:[id number] DOB: 1946/02/04 DOA: 03/17/2017 PCP: Marletta Lor, MD    Brief Narrative:  71 y.o. woman with a history of CAD S/P CABG, PPM implant for complete heart block, Uncontrolled Type 2 DM with neuropathy, Bell's Palsy, HTN, and chronic systolic heart failure who presents to the ED for evaluation of slurred speech.  Symptoms started on July 2nd.  She woke up and noticed difficulty forming her words, but she did not have word finding difficulty.  No difficulty swallowing.  No coughing or choking with eating.  She has had bilateral leg weakness and the feeling that she cannot stand up for a long time, but she denies one side being weaker than the other.  Assessment & Plan:   Slurred speech/Stroke-like symptoms - Neurology consulted and currently suspecting a subcortical infarct. Plan is for repeat CT scan tomorrow. Were unable to obtain MRI secondary to an compatible pacemaker. - LDL 108, goal less than 70 plan is to start patient on pravastatin 20 mg po daily  Active Problems:   Insulin controlled diabetes mellitus  with complications of neuropathy and vascular disease - We'll continue current insulin regimen. Continue carb modified diet    CAD (coronary artery disease) - on aspirin, pravastatin    HIstory of Bell's palsy    Hyperlipidemia - continue statin    Obesity (BMI 30-39.9)   Cardiac pacemaker  ([employer])   S/P CABG (coronary artery bypass graft)   DVT prophylaxis: SCD's Code Status: Full Family Communication: discussed with patient and spouse at bedside Disposition Plan: Pending further evaluation by neurology   Consultants:  Neurology   Procedures: None   Antimicrobials: None   Subjective: Pt reports that the main problem is problems with speech. That she has never had this before.  Objective: Vitals:   03/18/17 0400 03/18/17 0600 03/18/17 0949 03/18/17 1409  BP: (!) 130/57 (!) 122/57 (!) 167/91 (!)  128/51  Pulse: 65 (!) 59 77 (!) 59  Resp: 18 18 19 17   Temp: 98.2 F (36.8 C) 98 F (36.7 C) 97.7 F (36.5 C) 97.6 F (36.4 C)  TempSrc: Oral Oral Oral Oral  SpO2: 97% 98% 99% 99%  Weight:      Height:       No intake or output data in the 24 hours ending 03/18/17 1711 Filed Weights   03/17/17 1205  Weight: 94.8 kg (209 lb)    Examination:  General exam: Appears calm and comfortable, in nad. Respiratory system: Clear to auscultation. Respiratory effort normal. Cardiovascular system: S1 & S2 heard, RRR. Gastrointestinal system: Abdomen is nondistended, soft and nontender. No organomegaly or masses felt. Normal bowel sounds heard. Central nervous system: aphasia, right sided facial droop, right sided arm weakness when compared to the left. Extremities: Symmetric 5 x 5 power. Skin: No rashes, lesions or ulcers, on limited exam. Psychiatry:  Mood & affect appropriate.   Data Reviewed: I have personally reviewed following labs and imaging studies  CBC:  Recent Labs Lab 03/17/17 1220 03/17/17 1232  WBC 5.7  --   NEUTROABS 2.9  --   HGB 12.4 12.2  HCT 37.8 36.0  MCV 95.2  --   PLT 194  --    Basic Metabolic Panel:  Recent Labs Lab 03/17/17 1220 03/17/17 1232  NA 139 141  K 3.8 3.8  CL 107 107  CO2 24  --   GLUCOSE 182* 179*  BUN 23* 25*  CREATININE 0.89 0.70  CALCIUM  9.5  --    GFR: Estimated Creatinine Clearance: 71 mL/min (by C-G formula based on SCr of 0.7 mg/dL). Liver Function Tests:  Recent Labs Lab 03/17/17 1220  AST 30  ALT 31  ALKPHOS 65  BILITOT 0.4  PROT 6.9  ALBUMIN 3.7   No results for input(s): LIPASE, AMYLASE in the last 168 hours. No results for input(s): AMMONIA in the last 168 hours. Coagulation Profile:  Recent Labs Lab 03/17/17 1220  INR 0.94   Cardiac Enzymes: No results for input(s): CKTOTAL, CKMB, CKMBINDEX, TROPONINI in the last 168 hours. BNP (last 3 results) No results for input(s): PROBNP in the last 8760  hours. HbA1C: No results for input(s): HGBA1C in the last 72 hours. CBG:  Recent Labs Lab 03/17/17 1213 03/17/17 1754 03/17/17 2038 03/18/17 0616 03/18/17 1355  GLUCAP 175* 79 99 191* 285*   Lipid Profile:  Recent Labs  03/18/17 0556  CHOL 183  HDL 35*  LDLCALC 108*  TRIG 201*  CHOLHDL 5.2   Thyroid Function Tests: No results for input(s): TSH, T4TOTAL, FREET4, T3FREE, THYROIDAB in the last 72 hours. Anemia Panel: No results for input(s): VITAMINB12, FOLATE, FERRITIN, TIBC, IRON, RETICCTPCT in the last 72 hours. Sepsis Labs: No results for input(s): PROCALCITON, LATICACIDVEN in the last 168 hours.  No results found for this or any previous visit (from the past 240 hour(s)).       Radiology Studies: Ct Angio Head W Or Wo Contrast  Result Date: 03/17/2017 CLINICAL DATA:  Stroke EXAM: CT ANGIOGRAPHY HEAD AND NECK TECHNIQUE: Multidetector CT imaging of the head and neck was performed using the standard protocol during bolus administration of intravenous contrast. Multiplanar CT image reconstructions and MIPs were obtained to evaluate the vascular anatomy. Carotid stenosis measurements (when applicable) are obtained utilizing NASCET criteria, using the distal internal carotid diameter as the denominator. CONTRAST:  50 mL Isovue 370 COMPARISON:  Head CT 03/17/2017 FINDINGS: CTA NECK FINDINGS Aortic arch: There is no aneurysm or dissection of the visualized ascending aorta or aortic arch. There is a normal 3 vessel branching pattern. The visualized proximal subclavian arteries are normal. Right carotid system: The right common carotid origin is widely patent. There is no common carotid or internal carotid artery dissection or aneurysm. There is calcification of the carotid bifurcation without hemodynamically significant stenosis. Left carotid system: The left common carotid origin is widely patent. There is no common carotid or internal carotid artery dissection or aneurysm. There is  calcification at the carotid bifurcation without hemodynamically significant stenosis. Vertebral arteries: The vertebral system is left dominant. Both vertebral artery origins are normal. Both vertebral arteries are normal to their confluence with the basilar artery aside from mild bilateral V4 segment atherosclerotic calcification. Skeleton: There is no bony spinal canal stenosis. No lytic or blastic lesions. Other neck: The nasopharynx is clear. The oropharynx and hypopharynx are normal. The epiglottis is normal. The supraglottic larynx, glottis and subglottic larynx are normal. No retropharyngeal collection. The parapharyngeal spaces are preserved. The parotid and submandibular glands are normal. No sialolithiasis or salivary ductal dilatation. The thyroid gland is normal. There is no cervical lymphadenopathy. Upper chest: No pneumothorax or pleural effusion. No nodules or masses. Review of the MIP images confirms the above findings CTA HEAD FINDINGS Anterior circulation: --Intracranial internal carotid arteries: There is bilateral atherosclerotic calcification of the internal carotid arteries at the skullbase with mild narrowing on the right. --Anterior cerebral arteries: The right A1 segment is absent, a normal congenital variant. Otherwise standard appearance  of the anterior cerebral arteries. --Middle cerebral arteries: Normal. --Posterior communicating arteries: Absent bilaterally. Posterior circulation: --Posterior cerebral arteries: Normal. --Superior cerebellar arteries: Normal. --Basilar artery: Normal. --Anterior inferior cerebellar arteries: Normal. --Posterior inferior cerebellar arteries: Normal. Venous sinuses: Limited opacification due to contrast phase. 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 Delayed phase: No parenchymal contrast enhancement. Review of the MIP images confirms the above findings IMPRESSION: 1. No emergent large vessel occlusion. 2. Mild bilateral carotid  bifurcation atherosclerosis without hemodynamically significant stenosis. 3. Moderate atherosclerotic calcification of the internal carotid arteries at the skullbase. Electronically Signed   By: Ulyses Jarred M.D.   On: 03/17/2017 22:49   Dg Chest 2 View  Result Date: 03/17/2017 CLINICAL DATA:  Acute slurred speech and leg weakness. EXAM: CHEST  2 VIEW COMPARISON:  11/06/2015 and prior exams FINDINGS: Cardiomegaly, CABG changes and left pacemaker/ ICD again noted. Mild pulmonary vascular congestion noted. There is no evidence of focal airspace disease, pulmonary edema, suspicious pulmonary nodule/mass, pleural effusion, or pneumothorax. No acute bony abnormalities are identified. IMPRESSION: Cardiomegaly with mild pulmonary vascular congestion. Electronically Signed   By: Margarette Canada M.D.   On: 03/17/2017 16:39   Ct Head Wo Contrast  Result Date: 03/17/2017 CLINICAL DATA:  Slurred speech with a EXAM: CT HEAD WITHOUT CONTRAST TECHNIQUE: Contiguous axial images were obtained from the base of the skull through the vertex without intravenous contrast. COMPARISON:  October 21, 2012 FINDINGS: Brain: There is mild diffuse atrophy. There is no mass, hemorrhage, extra-axial fluid collection, or midline shift. There is patchy small vessel disease throughout the centra semiovale bilaterally. There is an age uncertain focal infarct in the posterior, superior right centrum semiovale. There is small vessel disease in the mid pons bilaterally in the basilar perforator distribution with focal age uncertain infarct in the mid left pons. There is a prior focal infarct in the posterior right the mid this. Vascular: There is no demonstrable hyperdense vessel. There is vascular calcification in each carotid siphon region as well as in each distal vertebral artery. Skull: The bony calvarium appears intact. Sinuses/Orbits: There is mucosal thickening in several ethmoid air cells bilaterally. Other paranasal sinuses are clear. Orbits  appear symmetric bilaterally. Other: Mastoid air cells are clear. IMPRESSION: 1. Atrophy with periventricular small vessel disease. Small vessel disease also noted in the mid pons regions. 2. Age uncertain infarcts in the left mid pons in the posterior, superior right centrum semiovale. 3.  No mass, hemorrhage, or extra-axial fluid collection. 4.  Multiple foci of arterial vascular calcification. 5.  Mild mucosal thickening in several ethmoid air cells. Electronically Signed   By: Lowella Grip III M.D.   On: 03/17/2017 12:52   Ct Angio Neck W Or Wo Contrast  Result Date: 03/17/2017 CLINICAL DATA:  Stroke EXAM: CT ANGIOGRAPHY HEAD AND NECK TECHNIQUE: Multidetector CT imaging of the head and neck was performed using the standard protocol during bolus administration of intravenous contrast. Multiplanar CT image reconstructions and MIPs were obtained to evaluate the vascular anatomy. Carotid stenosis measurements (when applicable) are obtained utilizing NASCET criteria, using the distal internal carotid diameter as the denominator. CONTRAST:  50 mL Isovue 370 COMPARISON:  Head CT 03/17/2017 FINDINGS: CTA NECK FINDINGS Aortic arch: There is no aneurysm or dissection of the visualized ascending aorta or aortic arch. There is a normal 3 vessel branching pattern. The visualized proximal subclavian arteries are normal. Right carotid system: The right common carotid origin is widely patent. There is no common carotid  or internal carotid artery dissection or aneurysm. There is calcification of the carotid bifurcation without hemodynamically significant stenosis. Left carotid system: The left common carotid origin is widely patent. There is no common carotid or internal carotid artery dissection or aneurysm. There is calcification at the carotid bifurcation without hemodynamically significant stenosis. Vertebral arteries: The vertebral system is left dominant. Both vertebral artery origins are normal. Both vertebral  arteries are normal to their confluence with the basilar artery aside from mild bilateral V4 segment atherosclerotic calcification. Skeleton: There is no bony spinal canal stenosis. No lytic or blastic lesions. Other neck: The nasopharynx is clear. The oropharynx and hypopharynx are normal. The epiglottis is normal. The supraglottic larynx, glottis and subglottic larynx are normal. No retropharyngeal collection. The parapharyngeal spaces are preserved. The parotid and submandibular glands are normal. No sialolithiasis or salivary ductal dilatation. The thyroid gland is normal. There is no cervical lymphadenopathy. Upper chest: No pneumothorax or pleural effusion. No nodules or masses. Review of the MIP images confirms the above findings CTA HEAD FINDINGS Anterior circulation: --Intracranial internal carotid arteries: There is bilateral atherosclerotic calcification of the internal carotid arteries at the skullbase with mild narrowing on the right. --Anterior cerebral arteries: The right A1 segment is absent, a normal congenital variant. Otherwise standard appearance of the anterior cerebral arteries. --Middle cerebral arteries: Normal. --Posterior communicating arteries: Absent bilaterally. Posterior circulation: --Posterior cerebral arteries: Normal. --Superior cerebellar arteries: Normal. --Basilar artery: Normal. --Anterior inferior cerebellar arteries: Normal. --Posterior inferior cerebellar arteries: Normal. Venous sinuses: Limited opacification due to contrast phase. 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 Delayed phase: No parenchymal contrast enhancement. Review of the MIP images confirms the above findings IMPRESSION: 1. No emergent large vessel occlusion. 2. Mild bilateral carotid bifurcation atherosclerosis without hemodynamically significant stenosis. 3. Moderate atherosclerotic calcification of the internal carotid arteries at the skullbase. Electronically Signed   By: Ulyses Jarred M.D.   On: 03/17/2017 22:49    Scheduled Meds: . acidophilus  1 capsule Oral Daily  . [START ON 03/19/2017] aspirin EC  325 mg Oral Daily  . furosemide  20 mg Oral q1800  . furosemide  40 mg Oral Daily  . insulin aspart  0-15 Units Subcutaneous TID WC  . insulin aspart  0-5 Units Subcutaneous QHS  . lisinopril  20 mg Oral Daily  . loratadine  10 mg Oral Daily  . metoprolol succinate  50 mg Oral Daily  . potassium chloride SA  20 mEq Oral Daily  . pravastatin  20 mg Oral q1800  . psyllium  1 packet Oral TID   Continuous Infusions:   LOS: 0 days    Time spent: > 35 minutes  Velvet Bathe, MD [facility] Pager [phone]  If 7PM-7AM, please contact night-coverage [email or web address] Password [email or web address] 03/18/2017, 5:11 PM

## 2017-03-18 NOTE — Care Management Obs Status (Signed)
[facility] NOTIFICATION   Patient Details  Name: Laurie [name] MRN: [id number] Date of Birth: 03-Apr-1946   Medicare Observation Status Notification Given:  Yes    Pollie Friar, RN 03/18/2017, 4:03 PM

## 2017-03-18 NOTE — Progress Notes (Signed)
  Echocardiogram 2D Echocardiogram with definity has been performed.  Laurie [name] 03/18/2017, 10:32 AM

## 2017-03-18 NOTE — Progress Notes (Addendum)
OT Cancellation Note and DISCHARGE  Patient Details Name: Laurie [name] MRN: [id number] DOB: 1946/02/19   Cancelled Treatment:    Reason Eval/Treat Not Completed: OT screened, no needs identified, will sign off. Pt performed well with PT and major concerns are speech, Pt will have 24 hour assist from husband upon dc. After consulting Pt and PT OT will sign off. Thank you for this referral. Jaci Carrel 03/18/2017, 11:50 AM  Hulda Humphrey OTR/L [phone]

## 2017-03-18 NOTE — Evaluation (Signed)
Speech Language Pathology Evaluation Patient Details Name: Laurie [name] MRN: [id number] DOB: 1946-06-07 Today's Date: 03/18/2017 Time: 8099-8338 SLP Time Calculation (min) (ACUTE ONLY): 17 min  Problem List:  Patient Active Problem List   Diagnosis Date Noted  . Slurred speech 03/17/2017  . Stroke-like symptoms 03/17/2017  . Type 2 diabetes mellitus with diabetic neuropathy, unspecified ([place]) 01/21/2017  . NICM (nonischemic cardiomyopathy) ([place]) 11/05/2015  . Chronic systolic CHF (congestive heart failure) ([place]) 10/22/2015  . Complete heart block ([place])   . S/P CABG (coronary artery bypass graft)   . Cardiac pacemaker  ([employer]) 10/23/2012  . Hyperlipidemia 04/18/2012  . Obesity (BMI 30-39.9)   . Sleep apnea   . Peripheral neuropathy   . HIstory of Bell's palsy   . Multinodular goiter   . 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 Hypertensive heart disease without CHF   . CAD (coronary artery disease)    Past Medical History:  Past Medical History:  Diagnosis Date  . AICD (automatic cardioverter/defibrillator) present   . Arthritis    "hands; I think I've got some in my back" (03/17/2017)  . Bell's palsy 01/05/2010  . CAD (coronary artery disease)    Cath  07/21/1999  mild ostial, 80% stenosis proximal LAD, 90% stenosis proximal Diag 1, 95% stenosis proximal OM 1, 80% stenosis proximal RCA, 95% stenosis mid RCA    CABG 07/22/99  LIMA to LAD, SVG to dx, SVG to OM, SVG to PDA  Dr. Carmin Muskrat 2012 no ischemia EF 67%   . CHF (congestive heart failure) ([place]) dx'd 10/17/2015  . Chronic lower back pain   . Cystitis   . Heart murmur   . Hepatitis 1970s   "don't know which" (03/17/2017)  . HIstory of Bell's palsy    October 2012   . Hyperlipidemia    Intolerance to several statins   . Hypertension   . Hypertensive heart disease without CHF   . Multinodular goiter   . Obesity (BMI 30-39.9)   . OSA on CPAP    "suppose  to wear a mask" (03/17/2017)  . Peripheral neuropathy   . Pneumonia 1990s X 1   "think i had walking pneumonia"  . Seasonal allergies   . Stroke ([place]) 03/2017   "light one"; denies residual on 03/17/2017)  . Thyromegaly    mild  . Type II diabetes mellitus ([place])    Past Surgical History:  Past Surgical History:  Procedure Laterality Date  . BI-VENTRICULAR PACEMAKER UPGRADE  11/05/2015   "upgraded my pacemaker"  . BIOPSY THYROID  05/2010   percutaneous  . CARDIAC CATHETERIZATION N/A 10/21/2015   Procedure: Left Heart Cath and Cors/Grafts Angiography;  Surgeon: Belva Crome, MD;  Location: [place] [facility] LAB;  Service: Cardiovascular;  Laterality: N/A;  . CARPAL TUNNEL RELEASE Bilateral   . CATARACT EXTRACTION Right 01/06/2015  . CATARACT EXTRACTION W/ INTRAOCULAR LENS IMPLANT Left 12/16/2014  . CORONARY ARTERY BYPASS GRAFT  2000  . CYSTECTOMY Right    "middle finger"  . DENTAL IMPLANTS    . EP IMPLANTABLE DEVICE N/A 11/05/2015   Procedure: BiV Upgrade;  Surgeon: Deboraha Sprang, MD;  Location: [place] [facility] LAB;  Service: Cardiovascular;  Laterality: N/A;  . INSERT / REPLACE / REMOVE PACEMAKER    . LEAD REVISION N/A 10/25/2012   Procedure: LEAD REVISION;  Surgeon: Evans Lance, MD;  Location: [facility] CATH LAB;  Service: Cardiovascular;  Laterality: N/A;  .  LEFT HEART CATHETERIZATION WITH CORONARY ANGIOGRAM N/A 10/23/2012   Procedure: LEFT HEART CATHETERIZATION WITH CORONARY ANGIOGRAM;  Surgeon: Jacolyn Reedy, MD;  Location: [facility] CATH LAB;  Service: Cardiovascular;  Laterality: N/A;  . PERMANENT PACEMAKER INSERTION N/A 10/23/2012   Procedure: PERMANENT PACEMAKER INSERTION;  Surgeon: Deboraha Sprang, MD;  Location: [facility] CATH LAB;  Service: Cardiovascular;  Laterality: N/A;  . PLANTAR FASCIA RELEASE Left    "& clipped a tendon that went thru bottom of my foot"  . TEMPORARY PACEMAKER INSERTION N/A 10/22/2012   Procedure: TEMPORARY PACEMAKER INSERTION;  Surgeon: Troy Sine, MD;  Location: [facility] CATH  LAB;  Service: Cardiovascular;  Laterality: N/A;  . TUBAL LIGATION     HPI:  70 y.o.femalewith a history of coronary artery disease, CHF who presents with right-sided weakness that started on 7/2.  CT head shows infarcts left mid pons, age uncertain.     Assessment / Plan / Recommendation Clinical Impression  Pt presents with a mild dysarthria of speech impacting clarity of consonant production.  Comprehension and expressive language, fluency are WNL.   Pt may benefit from OP f/u to address speech if dysarthria does not resolve.  No further acute SLP needs are identified.  D/W pt/husband, as well as FAST acronym in the event she were to have future s/s of a stroke.      SLP Assessment  SLP Recommendation/Assessment: All further Speech Lanaguage Pathology  needs can be addressed in the next venue of care SLP Visit Diagnosis: Dysarthria and anarthria (R47.1)    Follow Up Recommendations  Outpatient SLP (if dysarthria persists)    Frequency and Duration           SLP Evaluation Cognition  Overall Cognitive Status: Within Functional Limits for tasks assessed       Comprehension  Auditory Comprehension Overall Auditory Comprehension: Appears within functional limits for tasks assessed Visual Recognition/Discrimination Discrimination: Within Function Limits Reading Comprehension Reading Status: Within funtional limits    Expression Expression Primary Mode of Expression: Verbal Verbal Expression Overall Verbal Expression: Appears within functional limits for tasks assessed Written Expression Dominant Hand: Right Written Expression: Within Functional Limits   Oral / Motor  Oral Motor/Sensory Function Overall Oral Motor/Sensory Function: Other (comment) (mild right VII lower asymmetry) Motor Speech Overall Motor Speech: Impaired Respiration: Within functional limits Phonation: Normal Resonance: Within functional limits Articulation: Impaired Level of Impairment:  Sentence Intelligibility: Intelligibility reduced Word: 75-100% accurate Phrase: 75-100% accurate Sentence: 75-100% accurate Motor Planning: Witnin functional limits   GO          Functional Assessment Tool Used: clinical judgment Functional Limitations: Motor speech Motor Speech Current Status [phone]): At least 1 percent but less than 20 percent impaired, limited or restricted         Laurie [name] 03/18/2017, 11:02 AM   Estill Bamberg L. Tivis Ringer, [place] CCC/SLP Pager [phone]

## 2017-03-19 ENCOUNTER — Observation Stay (HOSPITAL_COMMUNITY): Payer: Medicare HMO

## 2017-03-19 DIAGNOSIS — Z6836 Body mass index (BMI) 36.0-36.9, adult: Secondary | ICD-10-CM | POA: Diagnosis not present

## 2017-03-19 DIAGNOSIS — E1151 Type 2 diabetes mellitus with diabetic peripheral angiopathy without gangrene: Secondary | ICD-10-CM | POA: Diagnosis present

## 2017-03-19 DIAGNOSIS — I110 Hypertensive heart disease with heart failure: Secondary | ICD-10-CM | POA: Diagnosis present

## 2017-03-19 DIAGNOSIS — Z951 Presence of aortocoronary bypass graft: Secondary | ICD-10-CM | POA: Diagnosis not present

## 2017-03-19 DIAGNOSIS — Z794 Long term (current) use of insulin: Secondary | ICD-10-CM | POA: Diagnosis not present

## 2017-03-19 DIAGNOSIS — Z9842 Cataract extraction status, left eye: Secondary | ICD-10-CM | POA: Diagnosis not present

## 2017-03-19 DIAGNOSIS — I5022 Chronic systolic (congestive) heart failure: Secondary | ICD-10-CM | POA: Diagnosis present

## 2017-03-19 DIAGNOSIS — Z950 Presence of cardiac pacemaker: Secondary | ICD-10-CM | POA: Diagnosis not present

## 2017-03-19 DIAGNOSIS — Z961 Presence of intraocular lens: Secondary | ICD-10-CM | POA: Diagnosis present

## 2017-03-19 DIAGNOSIS — R29810 Facial weakness: Secondary | ICD-10-CM | POA: Diagnosis present

## 2017-03-19 DIAGNOSIS — Z9841 Cataract extraction status, right eye: Secondary | ICD-10-CM | POA: Diagnosis not present

## 2017-03-19 DIAGNOSIS — Z9119 Patient's noncompliance with other medical treatment and regimen: Secondary | ICD-10-CM | POA: Diagnosis not present

## 2017-03-19 DIAGNOSIS — E669 Obesity, unspecified: Secondary | ICD-10-CM | POA: Diagnosis present

## 2017-03-19 DIAGNOSIS — Z79899 Other long term (current) drug therapy: Secondary | ICD-10-CM | POA: Diagnosis not present

## 2017-03-19 DIAGNOSIS — E785 Hyperlipidemia, unspecified: Secondary | ICD-10-CM | POA: Diagnosis present

## 2017-03-19 DIAGNOSIS — R29702 NIHSS score 2: Secondary | ICD-10-CM | POA: Diagnosis present

## 2017-03-19 DIAGNOSIS — I2510 Atherosclerotic heart disease of native coronary artery without angina pectoris: Secondary | ICD-10-CM | POA: Diagnosis present

## 2017-03-19 DIAGNOSIS — I6330 Cerebral infarction due to thrombosis of unspecified cerebral artery: Secondary | ICD-10-CM | POA: Diagnosis not present

## 2017-03-19 DIAGNOSIS — Z7982 Long term (current) use of aspirin: Secondary | ICD-10-CM | POA: Diagnosis not present

## 2017-03-19 DIAGNOSIS — I639 Cerebral infarction, unspecified: Secondary | ICD-10-CM | POA: Diagnosis present

## 2017-03-19 DIAGNOSIS — E1142 Type 2 diabetes mellitus with diabetic polyneuropathy: Secondary | ICD-10-CM | POA: Diagnosis present

## 2017-03-19 DIAGNOSIS — E042 Nontoxic multinodular goiter: Secondary | ICD-10-CM | POA: Diagnosis present

## 2017-03-19 DIAGNOSIS — G4733 Obstructive sleep apnea (adult) (pediatric): Secondary | ICD-10-CM | POA: Diagnosis present

## 2017-03-19 DIAGNOSIS — R4701 Aphasia: Secondary | ICD-10-CM | POA: Diagnosis present

## 2017-03-19 LAB — GLUCOSE, CAPILLARY
GLUCOSE-CAPILLARY: 194 mg/dL — AB (ref 65–99)
Glucose-Capillary: 227 mg/dL — ABNORMAL HIGH (ref 65–99)
Glucose-Capillary: 250 mg/dL — ABNORMAL HIGH (ref 65–99)

## 2017-03-19 LAB — HEMOGLOBIN A1C
HEMOGLOBIN A1C: 8.4 % — AB (ref 4.8–5.6)
MEAN PLASMA GLUCOSE: 194 mg/dL

## 2017-03-19 NOTE — Progress Notes (Signed)
STROKE TEAM PROGRESS NOTE  Laurie [name] is a 71 y.o. female with a history of coronary artery disease, 3rd degree heart block with implanted pacemaker, chronic systolic heart failure, heart murmur, hypertension, hyperlipidemia, multinodular goiter, OSA on CPAP, Type II diabetes mellitus, peripheral neuropathy, and left-sided Bell's palsy, who presented 03/17/2017 with right-sided weakness and slurred speech that started on 03/14/2017. She states that she has noticed a change in her handwriting and had some difficulty walking since that time. Her speech became more on the night of 03/16/2017 and she went to bed hoping is would improve.  When she awoke, her slurred speech persisted and she also felt weak in her knees.  The patient states she has not been wearing her CPAP because her mask will not seal well to her face.  The patient also reports poor compliance with her prescribed 81mg  aspirin because she claims it upsets her stomach, and was not aware that enteric coated aspirin is available. LKW: 03/14/2017  Patient was not administered IV t-PA secondary to presenting outside of the treatment window.  She was admitted to General Neurology for further evaluation and treatment.   SUBJECTIVE (INTERVAL HISTORY) Her family is not   at the bedside.  The patient is awake, alert, and follows all commands appropriately. Still feels her  mild right facial droop and right UE weakness has improved.    OBJECTIVE Temp:  [97.5 F (36.4 C)-98.2 F (36.8 C)] 98.2 F (36.8 C) (07/07 1013) Pulse Rate:  [59-83] 71 (07/07 1013) Cardiac Rhythm: A-V Sequential paced;Normal sinus rhythm (07/07 0701) Resp:  [17-20] 20 (07/07 1013) BP: (128-134)/(51-69) 131/60 (07/07 1013) SpO2:  [94 %-100 %] 94 % (07/07 1013)  CBC:   Recent Labs Lab 03/17/17 1220 03/17/17 1232  WBC 5.7  --   NEUTROABS 2.9  --   HGB 12.4 12.2  HCT 37.8 36.0  MCV 95.2  --   PLT 194  --     Basic Metabolic Panel:   Recent Labs Lab  03/17/17 1220 03/17/17 1232  NA 139 141  K 3.8 3.8  CL 107 107  CO2 24  --   GLUCOSE 182* 179*  BUN 23* 25*  CREATININE 0.89 0.70  CALCIUM 9.5  --     Lipid Panel:     Component Value Date/Time   CHOL 183 03/18/2017 0556   TRIG 201 (H) 03/18/2017 0556   HDL 35 (L) 03/18/2017 0556   CHOLHDL 5.2 03/18/2017 0556   VLDL 40 03/18/2017 0556   LDLCALC 108 (H) 03/18/2017 0556   HgbA1c:  Lab Results  Component Value Date   HGBA1C 8.4 (H) 03/18/2017   Urine Drug Screen:     Component Value Date/Time   LABOPIA NONE DETECTED 03/18/2017 0935   COCAINSCRNUR NONE DETECTED 03/18/2017 0935   LABBENZ NONE DETECTED 03/18/2017 0935   AMPHETMU NONE DETECTED 03/18/2017 0935   THCU NONE DETECTED 03/18/2017 0935   LABBARB NONE DETECTED 03/18/2017 0935    Alcohol Level No results found for: [place] I have personally reviewed the radiological images below and agree with the radiology interpretations.  Ct Angio Head and neck W Or Wo Contrast 03/17/2017 IMPRESSION: 1. No emergent large vessel occlusion. 2. Mild bilateral carotid bifurcation atherosclerosis without hemodynamically significant stenosis. 3. Moderate atherosclerotic calcification of the internal carotid arteries at the skullbase.  Dg Chest 2 View 03/17/2017 IMPRESSION: Cardiomegaly with mild pulmonary vascular congestion.  Ct Head Wo Contrast 03/17/2017 IMPRESSION: 1. Atrophy with periventricular small vessel disease. Small vessel  disease also noted in the mid pons regions. 2. Age uncertain infarcts in the left mid pons in the posterior, superior right centrum semiovale. 3.  No mass, hemorrhage, or extra-axial fluid collection. 4.  Multiple foci of arterial vascular calcification. 5.  Mild mucosal thickening in several ethmoid air cells.    CT repeat  No acute infarct  TTE pending   PHYSICAL EXAM  Temp:  [97.5 F (36.4 C)-98.2 F (36.8 C)] 98.2 F (36.8 C) (07/07 1013) Pulse Rate:  [59-83] 71 (07/07 1013) Resp:   [17-20] 20 (07/07 1013) BP: (128-134)/(51-69) 131/60 (07/07 1013) SpO2:  [94 %-100 %] 94 % (07/07 1013)  General - Well nourished, well developed, in no apparent distress.  Ophthalmologic - Sharp disc margins OU.   Cardiovascular - Regular rate and rhythm.  Mental Status -  Level of arousal and orientation to time, place, and person were intact. Language including expression, naming, repetition, comprehension was assessed and found intact. Fund of Knowledge was assessed and was intact.  Cranial Nerves II - XII - II - Visual field intact OU. III, IV, VI - Extraocular movements intact. V - Facial sensation intact bilaterally. VII - right mild facial droop. VIII - Hearing & vestibular intact bilaterally. X - Palate elevates symmetrically. XI - Chin turning & shoulder shrug intact bilaterally. XII - Tongue protrusion intact.  Motor Strength - The patient's strength was normal in all extremities except right hand mild dexterity difficulty and pronator drift was absent.  Bulk was normal and fasciculations were absent.   Motor Tone - Muscle tone was assessed at the neck and appendages and was normal.  Reflexes - The patient's reflexes were 1+ in all extremities and she had no pathological reflexes.  Sensory - Light touch, temperature/pinprick were assessed and were symmetrical.    Coordination - The patient had normal movements in the hands with no ataxia or dysmetria.  Tremor was absent.  Gait and Station - deferred   ASSESSMENT/PLAN [name] is a 71 y.o. female with history of  coronary artery disease, 3rd degree heart block with implanted pacemaker, chronic systolic heart failure, heart murmur, hypertension, hyperlipidemia, multinodular goiter, OSA on CPAP, Type II diabetes mellitus, peripheral neuropathy, and left-sided Bell's palsy presenting with right-sided weakness and slurred speech . She did not receive IV t-PA due to arriving outside of the treatment window.    Stroke: suspect left subcortical small infarct likely due to small vessel disease  Not visualized on CT x 2  Resultant  Right facial droop and right hand weakness  CT head: chronic lacunar infarcts including left mid pons, right CS and right BG   MRI and MRA head: unable to obtain due to incompatible pacemaker  CTA head and neck - b/l siphon and left VA atherosclerosis  CT repeat  No acute infarct  2D Echo  pending  Pacer interrogation - no atrial or ventricular arrhythmia seen  LDL 108  HgbA1c 8.4  SCDs for VTE prophylaxis Diet heart healthy/carb modified Room service appropriate? Yes; Fluid consistency: Thin; Fluid restriction: 1500 mL Fluid  aspirin 81 mg daily prior to admission with self-reported poor compliance, now on aspirin 325 mg daily. Continue ASA EC 325 on discharge.  Patient counseled to be compliant with her antithrombotic medications  Ongoing aggressive stroke risk factor management  Therapy recommendations:  Outpatient SLP (if dysarthria persists)   Disposition:  pending  Hypertension  Stable  Permissive hypertension (OK if < 220/120) but gradually normalize in 5-7 days  Long-term BP goal normotensive  Hyperlipidemia  Home meds: none  LDL 108, goal < 70  Hx of statin intolerance to lipitor and crestor  Would try pravastatin 20mg  daily  Diabetes  HgbA1c 8.0 in 01/2017  HgbA1C 8.4 oal < 7.0  Uncontrolled  CBG fluctuating  SSI  Close PCP follow up  Other Stroke Risk Factors  Advanced age  Obesity, Body mass index is 37.62 kg/m., recommend weight loss, diet and exercise as appropriate   Hx of stroke - on imaging  CAD s/p CABG  OSA, self-reports poor compliance with CPAP at home  Chronic systolic heart failure  Other Active Problems  Pacemaker-dependent 3rd-degree heart block  Hospital day # 0 I have personally examined this patient, reviewed notes, independently viewed imaging studies, participated in medical decision  making and plan of care.ROS completed by me personally and pertinent positives fully documented  I have made any additions or clarifications directly to the above note. Patient was compliant to take her aspirin and maintain aggressive risk factor modification. Follow-up in the stroke clinic in 6 weeks. Stroke team will sign off. Kindly call for questions.  Antony Contras, MD Medical Director [place] Pager: [phone] 03/19/2017 11:55 AM  Antony Contras, MD Stroke Neurology 03/19/2017 11:50 AM    To contact Stroke Continuity provider, please refer to [email or web address] After hours, contact General Neurology

## 2017-03-19 NOTE — Progress Notes (Addendum)
PROGRESS NOTE    Laurie [name]  LFY:[id number] DOB: Apr 04, 1946 DOA: 03/17/2017 PCP: Marletta Lor, MD    Brief Narrative:  71 y.o. woman with a history of CAD S/P CABG, PPM implant for complete heart block, Uncontrolled Type 2 DM with neuropathy, Bell's Palsy, HTN, and chronic systolic heart failure who presents to the ED for evaluation of slurred speech.  Symptoms started on July 2nd.  She woke up and noticed difficulty forming her words, but she did not have word finding difficulty.  No difficulty swallowing.  No coughing or choking with eating.  She has had bilateral leg weakness and the feeling that she cannot stand up for a long time, but she denies one side being weaker than the other.  Assessment & Plan:   Slurred speech/Stroke-like symptoms - Neurology consulted and currently suspecting a subcortical infarct. Addendum: CT scan repeated Were unable to obtain MRI secondary to an compatible pacemaker. - LDL 108, goal less than 70 pt started on pravastatin 20 mg po daily - currently undergoing stroke work up. - awaiting echocardiogram.  Active Problems:   Insulin controlled diabetes mellitus  with complications of neuropathy and vascular disease - Continue carb modified diet - We'll continue current insulin regimen.     CAD (coronary artery disease) - on aspirin, pravastatin    HIstory of Bell's palsy    Hyperlipidemia - please see ldl discussion above.    Obesity (BMI 30-39.9)   Cardiac pacemaker  ([employer])   S/P CABG (coronary artery bypass graft)   DVT prophylaxis: SCD's Code Status: Full Family Communication: discussed with patient and spouse at bedside Disposition Plan: Pending further evaluation by neurology   Consultants:  Neurology   Procedures: None   Antimicrobials: None   Subjective: Pt has no new complaints. No acute issues overnight.  Objective: Vitals:   03/19/17 0017 03/19/17 0456 03/19/17 1013 03/19/17 1354  BP: (!) 133/59  131/69 131/60 117/62  Pulse: 69 83 71 71  Resp: 18 20 20 20   Temp: (!) 97.5 F (36.4 C) 98 F (36.7 C) 98.2 F (36.8 C) 97.9 F (36.6 C)  TempSrc: Oral Oral Oral Oral  SpO2: 98% 95% 94% 93%  Weight:      Height:       No intake or output data in the 24 hours ending 03/19/17 1452 Filed Weights   03/17/17 1205  Weight: 94.8 kg (209 lb)    Examination: Exam unchanged from last exam on 03/18/17  General exam: Appears calm and comfortable, in nad. Respiratory system: Clear to auscultation. Respiratory effort normal. Cardiovascular system: S1 & S2 heard, RRR.  Gastrointestinal system: Abdomen is nondistended, soft and nontender. No organomegaly or masses felt. Normal bowel sounds heard. Central nervous system: aphasia, right sided facial droop, right sided arm weakness when compared to the left. Extremities: Symmetric 5 x 5 power. Skin: No rashes, lesions or ulcers, on limited exam. Psychiatry:  Mood & affect appropriate.   Data Reviewed: I have personally reviewed following labs and imaging studies  CBC:  Recent Labs Lab 03/17/17 1220 03/17/17 1232  WBC 5.7  --   NEUTROABS 2.9  --   HGB 12.4 12.2  HCT 37.8 36.0  MCV 95.2  --   PLT 194  --    Basic Metabolic Panel:  Recent Labs Lab 03/17/17 1220 03/17/17 1232  NA 139 141  K 3.8 3.8  CL 107 107  CO2 24  --   GLUCOSE 182* 179*  BUN 23* 25*  CREATININE  0.89 0.70  CALCIUM 9.5  --    GFR: Estimated Creatinine Clearance: 71 mL/min (by C-G formula based on SCr of 0.7 mg/dL). Liver Function Tests:  Recent Labs Lab 03/17/17 1220  AST 30  ALT 31  ALKPHOS 65  BILITOT 0.4  PROT 6.9  ALBUMIN 3.7   No results for input(s): LIPASE, AMYLASE in the last 168 hours. No results for input(s): AMMONIA in the last 168 hours. Coagulation Profile:  Recent Labs Lab 03/17/17 1220  INR 0.94   Cardiac Enzymes: No results for input(s): CKTOTAL, CKMB, CKMBINDEX, TROPONINI in the last 168 hours. BNP (last 3 results) No  results for input(s): PROBNP in the last 8760 hours. HbA1C:  Recent Labs  03/18/17 0556  HGBA1C 8.4*   CBG:  Recent Labs Lab 03/18/17 1355 03/18/17 1659 03/18/17 2100 03/19/17 0615 03/19/17 1131  GLUCAP 285* 275* 251* 194* 227*   Lipid Profile:  Recent Labs  03/18/17 0556  CHOL 183  HDL 35*  LDLCALC 108*  TRIG 201*  CHOLHDL 5.2   Thyroid Function Tests: No results for input(s): TSH, T4TOTAL, FREET4, T3FREE, THYROIDAB in the last 72 hours. Anemia Panel: No results for input(s): VITAMINB12, FOLATE, FERRITIN, TIBC, IRON, RETICCTPCT in the last 72 hours. Sepsis Labs: No results for input(s): PROCALCITON, LATICACIDVEN in the last 168 hours.  No results found for this or any previous visit (from the past 240 hour(s)).       Radiology Studies: Ct Angio Head W Or Wo Contrast  Result Date: 03/17/2017 CLINICAL DATA:  Stroke EXAM: CT ANGIOGRAPHY HEAD AND NECK TECHNIQUE: Multidetector CT imaging of the head and neck was performed using the standard protocol during bolus administration of intravenous contrast. Multiplanar CT image reconstructions and MIPs were obtained to evaluate the vascular anatomy. Carotid stenosis measurements (when applicable) are obtained utilizing NASCET criteria, using the distal internal carotid diameter as the denominator. CONTRAST:  50 mL Isovue 370 COMPARISON:  Head CT 03/17/2017 FINDINGS: CTA NECK FINDINGS Aortic arch: There is no aneurysm or dissection of the visualized ascending aorta or aortic arch. There is a normal 3 vessel branching pattern. The visualized proximal subclavian arteries are normal. Right carotid system: The right common carotid origin is widely patent. There is no common carotid or internal carotid artery dissection or aneurysm. There is calcification of the carotid bifurcation without hemodynamically significant stenosis. Left carotid system: The left common carotid origin is widely patent. There is no common carotid or internal  carotid artery dissection or aneurysm. There is calcification at the carotid bifurcation without hemodynamically significant stenosis. Vertebral arteries: The vertebral system is left dominant. Both vertebral artery origins are normal. Both vertebral arteries are normal to their confluence with the basilar artery aside from mild bilateral V4 segment atherosclerotic calcification. Skeleton: There is no bony spinal canal stenosis. No lytic or blastic lesions. Other neck: The nasopharynx is clear. The oropharynx and hypopharynx are normal. The epiglottis is normal. The supraglottic larynx, glottis and subglottic larynx are normal. No retropharyngeal collection. The parapharyngeal spaces are preserved. The parotid and submandibular glands are normal. No sialolithiasis or salivary ductal dilatation. The thyroid gland is normal. There is no cervical lymphadenopathy. Upper chest: No pneumothorax or pleural effusion. No nodules or masses. Review of the MIP images confirms the above findings CTA HEAD FINDINGS Anterior circulation: --Intracranial internal carotid arteries: There is bilateral atherosclerotic calcification of the internal carotid arteries at the skullbase with mild narrowing on the right. --Anterior cerebral arteries: The right A1 segment is absent, a normal  congenital variant. Otherwise standard appearance of the anterior cerebral arteries. --Middle cerebral arteries: Normal. --Posterior communicating arteries: Absent bilaterally. Posterior circulation: --Posterior cerebral arteries: Normal. --Superior cerebellar arteries: Normal. --Basilar artery: Normal. --Anterior inferior cerebellar arteries: Normal. --Posterior inferior cerebellar arteries: Normal. Venous sinuses: Limited opacification due to contrast phase. 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 Delayed phase: No parenchymal contrast enhancement. Review of the MIP images confirms the above findings IMPRESSION: 1. No emergent  large vessel occlusion. 2. Mild bilateral carotid bifurcation atherosclerosis without hemodynamically significant stenosis. 3. Moderate atherosclerotic calcification of the internal carotid arteries at the skullbase. Electronically Signed   By: Ulyses Jarred M.D.   On: 03/17/2017 22:49   Dg Chest 2 View  Result Date: 03/17/2017 CLINICAL DATA:  Acute slurred speech and leg weakness. EXAM: CHEST  2 VIEW COMPARISON:  11/06/2015 and prior exams FINDINGS: Cardiomegaly, CABG changes and left pacemaker/ ICD again noted. Mild pulmonary vascular congestion noted. There is no evidence of focal airspace disease, pulmonary edema, suspicious pulmonary nodule/mass, pleural effusion, or pneumothorax. No acute bony abnormalities are identified. IMPRESSION: Cardiomegaly with mild pulmonary vascular congestion. Electronically Signed   By: Margarette Canada M.D.   On: 03/17/2017 16:39   Ct Head Wo Contrast  Result Date: 03/19/2017 CLINICAL DATA:  Follow-up examination for acute stroke, right-sided facial droop with weakness. EXAM: CT HEAD WITHOUT CONTRAST TECHNIQUE: Contiguous axial images were obtained from the base of the skull through the vertex without intravenous contrast. COMPARISON:  Prior CT from 03/17/2017. FINDINGS: Brain: Stable atrophy with chronic microvascular ischemic disease. Remote left paramedian pontine infarct again noted. No acute intracranial hemorrhage. No definite acute or evolving large vessel territory infarct identified. No mass lesion, midline shift or mass effect. No hydrocephalus. No extra-axial fluid collection. Vascular: No hyperdense vessel. Vascular calcifications again noted within the carotid siphons. Bilateral vertebral artery calcifications noted as well. Skull: Scalp soft tissues and calvarium within normal limits. Sinuses/Orbits: Globes and orbital soft tissues within normal limits. Patient status post lens extraction bilaterally. Paranasal sinuses and mastoid air cells remain clear. IMPRESSION:  1. Stable exam with no acute intracranial process identified. No appreciable acute or evolving ischemic infarct. 2. Remote left paramedian pontine lacunar infarct. 3. Stable atrophy with chronic small vessel ischemic disease. Electronically Signed   By: Jeannine Boga M.D.   On: 03/19/2017 05:06   Ct Angio Neck W Or Wo Contrast  Result Date: 03/17/2017 CLINICAL DATA:  Stroke EXAM: CT ANGIOGRAPHY HEAD AND NECK TECHNIQUE: Multidetector CT imaging of the head and neck was performed using the standard protocol during bolus administration of intravenous contrast. Multiplanar CT image reconstructions and MIPs were obtained to evaluate the vascular anatomy. Carotid stenosis measurements (when applicable) are obtained utilizing NASCET criteria, using the distal internal carotid diameter as the denominator. CONTRAST:  50 mL Isovue 370 COMPARISON:  Head CT 03/17/2017 FINDINGS: CTA NECK FINDINGS Aortic arch: There is no aneurysm or dissection of the visualized ascending aorta or aortic arch. There is a normal 3 vessel branching pattern. The visualized proximal subclavian arteries are normal. Right carotid system: The right common carotid origin is widely patent. There is no common carotid or internal carotid artery dissection or aneurysm. There is calcification of the carotid bifurcation without hemodynamically significant stenosis. Left carotid system: The left common carotid origin is widely patent. There is no common carotid or internal carotid artery dissection or aneurysm. There is calcification at the carotid bifurcation without hemodynamically significant stenosis. Vertebral arteries: The vertebral system is left  dominant. Both vertebral artery origins are normal. Both vertebral arteries are normal to their confluence with the basilar artery aside from mild bilateral V4 segment atherosclerotic calcification. Skeleton: There is no bony spinal canal stenosis. No lytic or blastic lesions. Other neck: The  nasopharynx is clear. The oropharynx and hypopharynx are normal. The epiglottis is normal. The supraglottic larynx, glottis and subglottic larynx are normal. No retropharyngeal collection. The parapharyngeal spaces are preserved. The parotid and submandibular glands are normal. No sialolithiasis or salivary ductal dilatation. The thyroid gland is normal. There is no cervical lymphadenopathy. Upper chest: No pneumothorax or pleural effusion. No nodules or masses. Review of the MIP images confirms the above findings CTA HEAD FINDINGS Anterior circulation: --Intracranial internal carotid arteries: There is bilateral atherosclerotic calcification of the internal carotid arteries at the skullbase with mild narrowing on the right. --Anterior cerebral arteries: The right A1 segment is absent, a normal congenital variant. Otherwise standard appearance of the anterior cerebral arteries. --Middle cerebral arteries: Normal. --Posterior communicating arteries: Absent bilaterally. Posterior circulation: --Posterior cerebral arteries: Normal. --Superior cerebellar arteries: Normal. --Basilar artery: Normal. --Anterior inferior cerebellar arteries: Normal. --Posterior inferior cerebellar arteries: Normal. Venous sinuses: Limited opacification due to contrast phase. 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 Delayed phase: No parenchymal contrast enhancement. Review of the MIP images confirms the above findings IMPRESSION: 1. No emergent large vessel occlusion. 2. Mild bilateral carotid bifurcation atherosclerosis without hemodynamically significant stenosis. 3. Moderate atherosclerotic calcification of the internal carotid arteries at the skullbase. Electronically Signed   By: Ulyses Jarred M.D.   On: 03/17/2017 22:49    Scheduled Meds: . acidophilus  1 capsule Oral Daily  . aspirin EC  325 mg Oral Daily  . furosemide  20 mg Oral q1800  . furosemide  40 mg Oral Daily  . insulin aspart  0-15 Units  Subcutaneous TID WC  . insulin aspart  0-5 Units Subcutaneous QHS  . lisinopril  20 mg Oral Daily  . loratadine  10 mg Oral Daily  . metoprolol succinate  50 mg Oral Daily  . potassium chloride SA  20 mEq Oral Daily  . pravastatin  20 mg Oral q1800  . psyllium  1 packet Oral TID   Continuous Infusions:   LOS: 0 days    Time spent: > 35 minutes  Velvet Bathe, MD [facility] Pager [phone]  If 7PM-7AM, please contact night-coverage [email or web address] Password [email or web address] 03/19/2017, 2:52 PM

## 2017-03-20 LAB — ECHOCARDIOGRAM COMPLETE
HEIGHTINCHES: 62.5 in
WEIGHTICAEL: 3344 [oz_av]

## 2017-03-20 LAB — GLUCOSE, CAPILLARY: GLUCOSE-CAPILLARY: 204 mg/dL — AB (ref 65–99)

## 2017-03-20 MED ORDER — CETIRIZINE HCL 10 MG PO TABS: 10.0000 mg | ORAL_TABLET | Freq: Every day | ORAL | Status: AC

## 2017-03-20 MED ORDER — INSULIN ASPART 100 UNIT/ML ~~LOC~~ SOLN: 0.0000 [IU] | Freq: Three times a day (TID) | SUBCUTANEOUS | 0 refills | Status: DC

## 2017-03-20 MED ORDER — ASPIRIN 325 MG PO TBEC: 325.0000 mg | DELAYED_RELEASE_TABLET | Freq: Every day | ORAL | 0 refills | Status: DC

## 2017-03-20 MED ORDER — PRAVASTATIN SODIUM 20 MG PO TABS: 20.0000 mg | ORAL_TABLET | Freq: Every day | ORAL | 0 refills | Status: DC

## 2017-03-20 NOTE — Discharge Summary (Signed)
Physician Discharge Summary  Laurie [name] XFG:[id number] DOB: 05-07-1946 DOA: 03/17/2017  PCP: Marletta Lor, MD  Admit date: 03/17/2017 Discharge date: 03/20/2017  Time spent: > 35  minutes  Recommendations for Outpatient Follow-up:  1. Please assess hypoglycemic agents as patient is under the impression that she is taking high-dose short-acting insulin at home. She has not required such high doses for blood sugar control. Will discharge with sliding-scale insulin regimen provided here in the hospital 2.  We'll hold the pressure medications as we suspect patient patient had infarct. Blood pressure should be normalized 5-7 days after hospital admission date. 3. Assess LDL    Discharge Diagnoses:  Active Problems:   Insulin controlled diabetes mellitus  with complications of neuropathy and vascular disease   CAD (coronary artery disease)   HIstory of Bell's palsy   Hyperlipidemia   Obesity (BMI 30-39.9)   Cardiac pacemaker  ([employer])   S/P CABG (coronary artery bypass graft)   Slurred speech   Stroke-like symptoms   Cerebral thrombosis with cerebral infarction   Discharge Condition: stable  Diet recommendation: heart healthy  Filed Weights   03/17/17 1205  Weight: 94.8 kg (209 lb)    History of present illness:  71 y.o. woman with a history of CAD S/P CABG, PPM implant for complete heart block, Uncontrolled Type 2 DM with neuropathy, Bell's Palsy, HTN, and chronic systolic heart failure who presents to the ED for evaluation of slurred speech.  Symptoms started on July 2nd.  Neurologist suspecting left subcortical small infarct likely due to small vessel disease  Not visualized on CT x 2  Hospital Course:  Suspected left subcortical small infarct - Neurology saw and this was their evaluation and recommendations: Stroke: suspect left subcortical small infarct likely due to small vessel disease  Not visualized on CT x 2  Resultant  Right facial droop and right hand  weakness  CT head: chronic lacunar infarcts including left mid pons, right CS and right BG   MRI and MRA head: unable to obtain due to incompatible pacemaker  CTA head and neck - b/l siphon and left VA atherosclerosis  CT repeat  No acute infarct  2D Echo  pending  Pacer interrogation - no atrial or ventricular arrhythmia seen  LDL 108  HgbA1c 8.4  SCDs for VTE prophylaxis  Diet heart healthy/carb modified Room service appropriate? Yes; Fluid consistency: Thin; Fluid restriction: 1500 mL Fluid  aspirin 81 mg daily prior to admission with self-reported poor compliance, now on aspirin 325 mg daily. Continue ASA EC 325 on discharge.  Patient counseled to be compliant with her antithrombotic medications  Ongoing aggressive stroke risk factor management  Therapy recommendations:  Outpatient SLP (if dysarthria persists)  Disposition:  pending  Hypertension  Stable              Permissive hypertension (OK if < 220/120) but gradually normalize in 5-7 days              Long-term BP goal normotensive  Have discussed with patient. Will hold her lisinopril and beta blocker on discharge which she can continue 03/22/2017 and gradually normalize her blood pressure  Hyperlipidemia  Home meds: none  LDL 108, goal < 70  Hx of statin intolerance to lipitor and crestor  Discharge on pravastatin 20mg  daily  Diabetes  HgbA1c 8.0 in 01/2017  HgbA1C 8.4 oal < 7.0  Uncontrolled  CBG fluctuating  SS[facility] will have nursing provide scale on discharge as I believe patient was given  a take too much short acting insulin based on her knee tear in the hospital. Have discussed with patient as well  Close PCP follow up  Other Stroke Risk Factors  Advanced age  Obesity, Body mass index is 37.62 kg/m., recommend weight loss, diet and exercise as appropriate   Hx of stroke - on imaging  CAD s/p CABG  OSA, self-reports poor compliance with CPAP at home  Chronic systolic heart  failure  Other Active Problems  Pacemaker-dependent 3rd-degree heart block  Procedures: Ct Angio Head and neck W Or Wo Contrast 03/17/2017 IMPRESSION: 1. No emergent large vessel occlusion. 2. Mild bilateral carotid bifurcation atherosclerosis without hemodynamically significant stenosis. 3. Moderate atherosclerotic calcification of the internal carotid arteries at the skullbase.  Dg Chest 2 View 03/17/2017 IMPRESSION: Cardiomegaly with mild pulmonary vascular congestion.  Ct Head Wo Contrast 03/17/2017 IMPRESSION: 1. Atrophy with periventricular small vessel disease. Small vessel disease also noted in the mid pons regions. 2. Age uncertain infarcts in the left mid pons in the posterior, superior right centrum semiovale. 3.  No mass, hemorrhage, or extra-axial fluid collection. 4.  Multiple foci of arterial vascular calcification. 5.  Mild mucosal thickening in several ethmoid air cells.    CT repeat  No acute infarct  TTE pending  Consultations:  Neurology  Discharge Exam: Vitals:   03/20/17 1041 03/20/17 1449  BP: (!) 111/48 (!) 100/52  Pulse: 65 74  Resp: 18 18  Temp: 98 F (36.7 C) 98.3 F (36.8 C)    General: Pt in nad, alert and awake Cardiovascular: rrr, no rubs Respiratory: no increased wob, no wheezes  Discharge Instructions   Discharge Instructions    Call MD for:  difficulty breathing, headache or visual disturbances    Complete by:  As directed    Call MD for:  redness, tenderness, or signs of infection (pain, swelling, redness, odor or green/yellow discharge around incision site)    Complete by:  As directed    Call MD for:  temperature >100.4    Complete by:  As directed    Diet - low sodium heart healthy    Complete by:  As directed    Discharge instructions    Complete by:  As directed    Please be sure to follow up with the neurologist after hospital discharge. Please call their office after hospital discharge to confirm appointment follow-up  date and time.  Please administer short acting insulin as per sliding scale you were receiving in the hospital.   Increase activity slowly    Complete by:  As directed      Current Discharge Medication List    START taking these medications   Details  insulin aspart (NOVOLOG) 100 UNIT/ML injection Inject 0-15 Units into the skin 3 (three) times daily with meals. Per hospital sliding scale. Will have nursing provide on discharge Qty: 10 mL, Refills: 0    pravastatin (PRAVACHOL) 20 MG tablet Take 1 tablet (20 mg total) by mouth daily at 6 PM. Qty: 30 tablet, Refills: 0      CONTINUE these medications which have CHANGED   Details  aspirin EC 325 MG EC tablet Take 1 tablet (325 mg total) by mouth daily. Qty: 30 tablet, Refills: 0    cetirizine (ZYRTEC) 10 MG tablet Take 1 tablet (10 mg total) by mouth daily.      CONTINUE these medications which have NOT CHANGED   Details  AMBULATORY NON FORMULARY MEDICATION "Omega Q Plus capsules" - Take 1 capsule  by mouth once a day    beta carotene w/minerals (OCUVITE) tablet Take 1 tablet by mouth daily.    cholecalciferol (VITAMIN D) 1000 UNITS tablet Take 2,000 Units by mouth daily.    furosemide (LASIX) 40 MG tablet Take 2 tablets by mouth every morning and 1 tablet by mouth every evening. Take additional 1 tablet by mouth for wt gain of 3 lbs or more. Qty: 360 tablet, Refills: 3    GLUCOSAMINE-CHONDROITIN PO Take 1 tablet by mouth daily.    ketoconazole (NIZORAL) 2 % cream Apply 1 application topically daily. TO AFFECTED AREAS OF FACE    nitroGLYCERIN (NITROSTAT) 0.4 MG SL tablet Place 0.4 mg under the tongue as needed for chest pain.     Omega-3 Fatty Acids (OMEGA-3 PO) Take 1 capsule by mouth daily.    omeprazole (PRILOSEC) 20 MG capsule Take 20 mg by mouth daily as needed (for reflux symptoms).    potassium chloride SA (KLOR-CON M20) 20 MEQ tablet Take 1 tablet (20 mEq total) by mouth daily. Qty: 180 tablet, Refills: 2     Probiotic Product (PROBIOTIC DAILY) CAPS Take 1 capsule by mouth daily.    psyllium (METAMUCIL) 58.6 % powder Take 1 packet by mouth 3 (three) times daily.    Sodium Chloride-Sodium Bicarb (NETI POT SINUS [place] NA) As needed/as directed for sinus infections    vitamin C (ASCORBIC ACID) 500 MG tablet Take 500 mg by mouth every other day.    ACCU-CHEK FASTCLIX LANCETS MISC USE  TO CHECK BLOOD SUGAR FOUR TIMES DAILY Qty: 408 each, Refills: 2    ACCU-CHEK SMARTVIEW test strip USE  TO CHECK BLOOD SUGAR FOUR TIMES DAILY Qty: 400 each, Refills: 1    Alcohol Swabs (B-D SINGLE USE SWABS REGULAR) PADS Use 8 daily for testing blood sugar and insulin injections. Qty: 800 each, Refills: 1    BD INSULIN SYRINGE ULTRAFINE 31G X 15/64" 1 ML MISC USE 10 TIMES A DAY WITH INSULIN AS DIRECTED Qty: 300 each, Refills: 0    Blood Glucose Calibration (ACCU-CHEK SMARTVIEW CONTROL) LIQD Use to calibrate blood glucose meter. Qty: 3 each, Refills: 1      STOP taking these medications     Cyanocobalamin (B-12) 2500 MCG SUBL      insulin regular (NOVOLIN R RELION) 100 units/mL injection      lisinopril (PRINIVIL,ZESTRIL) 20 MG tablet      metoprolol succinate (TOPROL-XL) 50 MG 24 hr tablet      NOVOLIN R 100 UNIT/ML injection        Allergies  Allergen Reactions  . Statins Other (See Comments)    Statins make the legs ache  . Tricor [Fenofibrate] Other (See Comments)    Leg pain  . Latex Itching and Other (See Comments)    Affected areas turn red, also-  . Tape Rash    Heart leads break out the skin      The results of significant diagnostics from this hospitalization (including imaging, microbiology, ancillary and laboratory) are listed below for reference.    Significant Diagnostic Studies: Ct Angio Head W Or Wo Contrast  Result Date: 03/17/2017 CLINICAL DATA:  Stroke EXAM: CT ANGIOGRAPHY HEAD AND NECK TECHNIQUE: Multidetector CT imaging of the head and neck was performed using the  standard protocol during bolus administration of intravenous contrast. Multiplanar CT image reconstructions and MIPs were obtained to evaluate the vascular anatomy. Carotid stenosis measurements (when applicable) are obtained utilizing NASCET criteria, using the distal internal carotid diameter as the  denominator. CONTRAST:  50 mL Isovue 370 COMPARISON:  Head CT 03/17/2017 FINDINGS: CTA NECK FINDINGS Aortic arch: There is no aneurysm or dissection of the visualized ascending aorta or aortic arch. There is a normal 3 vessel branching pattern. The visualized proximal subclavian arteries are normal. Right carotid system: The right common carotid origin is widely patent. There is no common carotid or internal carotid artery dissection or aneurysm. There is calcification of the carotid bifurcation without hemodynamically significant stenosis. Left carotid system: The left common carotid origin is widely patent. There is no common carotid or internal carotid artery dissection or aneurysm. There is calcification at the carotid bifurcation without hemodynamically significant stenosis. Vertebral arteries: The vertebral system is left dominant. Both vertebral artery origins are normal. Both vertebral arteries are normal to their confluence with the basilar artery aside from mild bilateral V4 segment atherosclerotic calcification. Skeleton: There is no bony spinal canal stenosis. No lytic or blastic lesions. Other neck: The nasopharynx is clear. The oropharynx and hypopharynx are normal. The epiglottis is normal. The supraglottic larynx, glottis and subglottic larynx are normal. No retropharyngeal collection. The parapharyngeal spaces are preserved. The parotid and submandibular glands are normal. No sialolithiasis or salivary ductal dilatation. The thyroid gland is normal. There is no cervical lymphadenopathy. Upper chest: No pneumothorax or pleural effusion. No nodules or masses. Review of the MIP images confirms the above  findings CTA HEAD FINDINGS Anterior circulation: --Intracranial internal carotid arteries: There is bilateral atherosclerotic calcification of the internal carotid arteries at the skullbase with mild narrowing on the right. --Anterior cerebral arteries: The right A1 segment is absent, a normal congenital variant. Otherwise standard appearance of the anterior cerebral arteries. --Middle cerebral arteries: Normal. --Posterior communicating arteries: Absent bilaterally. Posterior circulation: --Posterior cerebral arteries: Normal. --Superior cerebellar arteries: Normal. --Basilar artery: Normal. --Anterior inferior cerebellar arteries: Normal. --Posterior inferior cerebellar arteries: Normal. Venous sinuses: Limited opacification due to contrast phase. 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 Delayed phase: No parenchymal contrast enhancement. Review of the MIP images confirms the above findings IMPRESSION: 1. No emergent large vessel occlusion. 2. Mild bilateral carotid bifurcation atherosclerosis without hemodynamically significant stenosis. 3. Moderate atherosclerotic calcification of the internal carotid arteries at the skullbase. Electronically Signed   By: Ulyses Jarred M.D.   On: 03/17/2017 22:49   Dg Chest 2 View  Result Date: 03/17/2017 CLINICAL DATA:  Acute slurred speech and leg weakness. EXAM: CHEST  2 VIEW COMPARISON:  11/06/2015 and prior exams FINDINGS: Cardiomegaly, CABG changes and left pacemaker/ ICD again noted. Mild pulmonary vascular congestion noted. There is no evidence of focal airspace disease, pulmonary edema, suspicious pulmonary nodule/mass, pleural effusion, or pneumothorax. No acute bony abnormalities are identified. IMPRESSION: Cardiomegaly with mild pulmonary vascular congestion. Electronically Signed   By: Margarette Canada M.D.   On: 03/17/2017 16:39   Ct Head Wo Contrast  Result Date: 03/19/2017 CLINICAL DATA:  Follow-up examination for acute stroke,  right-sided facial droop with weakness. EXAM: CT HEAD WITHOUT CONTRAST TECHNIQUE: Contiguous axial images were obtained from the base of the skull through the vertex without intravenous contrast. COMPARISON:  Prior CT from 03/17/2017. FINDINGS: Brain: Stable atrophy with chronic microvascular ischemic disease. Remote left paramedian pontine infarct again noted. No acute intracranial hemorrhage. No definite acute or evolving large vessel territory infarct identified. No mass lesion, midline shift or mass effect. No hydrocephalus. No extra-axial fluid collection. Vascular: No hyperdense vessel. Vascular calcifications again noted within the carotid siphons. Bilateral vertebral artery calcifications noted as  well. Skull: Scalp soft tissues and calvarium within normal limits. Sinuses/Orbits: Globes and orbital soft tissues within normal limits. Patient status post lens extraction bilaterally. Paranasal sinuses and mastoid air cells remain clear. IMPRESSION: 1. Stable exam with no acute intracranial process identified. No appreciable acute or evolving ischemic infarct. 2. Remote left paramedian pontine lacunar infarct. 3. Stable atrophy with chronic small vessel ischemic disease. Electronically Signed   By: Jeannine Boga M.D.   On: 03/19/2017 05:06   Ct Head Wo Contrast  Result Date: 03/17/2017 CLINICAL DATA:  Slurred speech with a EXAM: CT HEAD WITHOUT CONTRAST TECHNIQUE: Contiguous axial images were obtained from the base of the skull through the vertex without intravenous contrast. COMPARISON:  October 21, 2012 FINDINGS: Brain: There is mild diffuse atrophy. There is no mass, hemorrhage, extra-axial fluid collection, or midline shift. There is patchy small vessel disease throughout the centra semiovale bilaterally. There is an age uncertain focal infarct in the posterior, superior right centrum semiovale. There is small vessel disease in the mid pons bilaterally in the basilar perforator distribution with  focal age uncertain infarct in the mid left pons. There is a prior focal infarct in the posterior right the mid this. Vascular: There is no demonstrable hyperdense vessel. There is vascular calcification in each carotid siphon region as well as in each distal vertebral artery. Skull: The bony calvarium appears intact. Sinuses/Orbits: There is mucosal thickening in several ethmoid air cells bilaterally. Other paranasal sinuses are clear. Orbits appear symmetric bilaterally. Other: Mastoid air cells are clear. IMPRESSION: 1. Atrophy with periventricular small vessel disease. Small vessel disease also noted in the mid pons regions. 2. Age uncertain infarcts in the left mid pons in the posterior, superior right centrum semiovale. 3.  No mass, hemorrhage, or extra-axial fluid collection. 4.  Multiple foci of arterial vascular calcification. 5.  Mild mucosal thickening in several ethmoid air cells. Electronically Signed   By: Lowella Grip III M.D.   On: 03/17/2017 12:52   Ct Angio Neck W Or Wo Contrast  Result Date: 03/17/2017 CLINICAL DATA:  Stroke EXAM: CT ANGIOGRAPHY HEAD AND NECK TECHNIQUE: Multidetector CT imaging of the head and neck was performed using the standard protocol during bolus administration of intravenous contrast. Multiplanar CT image reconstructions and MIPs were obtained to evaluate the vascular anatomy. Carotid stenosis measurements (when applicable) are obtained utilizing NASCET criteria, using the distal internal carotid diameter as the denominator. CONTRAST:  50 mL Isovue 370 COMPARISON:  Head CT 03/17/2017 FINDINGS: CTA NECK FINDINGS Aortic arch: There is no aneurysm or dissection of the visualized ascending aorta or aortic arch. There is a normal 3 vessel branching pattern. The visualized proximal subclavian arteries are normal. Right carotid system: The right common carotid origin is widely patent. There is no common carotid or internal carotid artery dissection or aneurysm. There is  calcification of the carotid bifurcation without hemodynamically significant stenosis. Left carotid system: The left common carotid origin is widely patent. There is no common carotid or internal carotid artery dissection or aneurysm. There is calcification at the carotid bifurcation without hemodynamically significant stenosis. Vertebral arteries: The vertebral system is left dominant. Both vertebral artery origins are normal. Both vertebral arteries are normal to their confluence with the basilar artery aside from mild bilateral V4 segment atherosclerotic calcification. Skeleton: There is no bony spinal canal stenosis. No lytic or blastic lesions. Other neck: The nasopharynx is clear. The oropharynx and hypopharynx are normal. The epiglottis is normal. The supraglottic larynx, glottis and subglottic larynx  are normal. No retropharyngeal collection. The parapharyngeal spaces are preserved. The parotid and submandibular glands are normal. No sialolithiasis or salivary ductal dilatation. The thyroid gland is normal. There is no cervical lymphadenopathy. Upper chest: No pneumothorax or pleural effusion. No nodules or masses. Review of the MIP images confirms the above findings CTA HEAD FINDINGS Anterior circulation: --Intracranial internal carotid arteries: There is bilateral atherosclerotic calcification of the internal carotid arteries at the skullbase with mild narrowing on the right. --Anterior cerebral arteries: The right A1 segment is absent, a normal congenital variant. Otherwise standard appearance of the anterior cerebral arteries. --Middle cerebral arteries: Normal. --Posterior communicating arteries: Absent bilaterally. Posterior circulation: --Posterior cerebral arteries: Normal. --Superior cerebellar arteries: Normal. --Basilar artery: Normal. --Anterior inferior cerebellar arteries: Normal. --Posterior inferior cerebellar arteries: Normal. Venous sinuses: Limited opacification due to contrast phase.  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 Delayed phase: No parenchymal contrast enhancement. Review of the MIP images confirms the above findings IMPRESSION: 1. No emergent large vessel occlusion. 2. Mild bilateral carotid bifurcation atherosclerosis without hemodynamically significant stenosis. 3. Moderate atherosclerotic calcification of the internal carotid arteries at the skullbase. Electronically Signed   By: Ulyses Jarred M.D.   On: 03/17/2017 22:49    Microbiology: No results found for this or any previous visit (from the past 240 hour(s)).   Labs: Basic Metabolic Panel:  Recent Labs Lab 03/17/17 1220 03/17/17 1232  NA 139 141  K 3.8 3.8  CL 107 107  CO2 24  --   GLUCOSE 182* 179*  BUN 23* 25*  CREATININE 0.89 0.70  CALCIUM 9.5  --    Liver Function Tests:  Recent Labs Lab 03/17/17 1220  AST 30  ALT 31  ALKPHOS 65  BILITOT 0.4  PROT 6.9  ALBUMIN 3.7   No results for input(s): LIPASE, AMYLASE in the last 168 hours. No results for input(s): AMMONIA in the last 168 hours. CBC:  Recent Labs Lab 03/17/17 1220 03/17/17 1232  WBC 5.7  --   NEUTROABS 2.9  --   HGB 12.4 12.2  HCT 37.8 36.0  MCV 95.2  --   PLT 194  --    Cardiac Enzymes: No results for input(s): CKTOTAL, CKMB, CKMBINDEX, TROPONINI in the last 168 hours. BNP: BNP (last 3 results) No results for input(s): BNP in the last 8760 hours.  ProBNP (last 3 results) No results for input(s): PROBNP in the last 8760 hours.  CBG:  Recent Labs Lab 03/18/17 2100 03/19/17 0615 03/19/17 1131 03/19/17 1642 03/20/17 0605  GLUCAP 251* 194* 227* 250* 204*    Signed:  Velvet Bathe MD.  [facility] 03/20/2017, 4:34 PM

## 2017-03-20 NOTE — Progress Notes (Signed)
Pt being discharged per orders from MD. Pt and family educated on discharge instructions. Pt and family verbalized understanding of instructions. All questions and concerns were addressed. Pt's IV was removed prior to discharge. Pt exited hospital via wheelchair. 

## 2017-03-21 ENCOUNTER — Telehealth: Payer: Self-pay | Admitting: *Deleted

## 2017-03-21 LAB — GLUCOSE, CAPILLARY
Glucose-Capillary: 270 mg/dL — ABNORMAL HIGH (ref 65–99)
Glucose-Capillary: 214 mg/dL — ABNORMAL HIGH (ref 65–99)
Glucose-Capillary: 268 mg/dL — ABNORMAL HIGH (ref 65–99)

## 2017-03-21 NOTE — Telephone Encounter (Signed)
patient concerned regarding insulin. Patient d/c from hospital on 03/20/17 dx: aphasia stroke, Hospital MD started her on Novolog per sliding scale, however the patient reports the prescription wasn't signed and the pharmacy wouldn't fill it. Patient has Novolin R at home and is wanting to know if she can take that with the same sliding scale instructions that the hospitalist ordered. Please advise. TCM call completed and patient coming in for hospital follow up/TCM visit on 03/23/17

## 2017-03-21 NOTE — Telephone Encounter (Signed)
D/C 03/20/17  Dx: Aphasia Stroke  Transition Care Management Follow-up Telephone Call  How have you been since you were released from the hospital? "I'm a little weak, but other that I'm feeling good"    Do you understand why you were in the hospital? "Yes, I had a mild stroke"   Do you understand the discharge instrcutions? Yes, reviewed all discharge instructions per AVS with patient, instructed on signs of stroke and seeking urgent medical attention should she have any signs. Reviewed all medications including purpose actions and side effects, educated on checking blood sugars and keeping blood sugar log.    Items Reviewed:  Medications reviewed: Yes, started Metamucil, ASA increased to 325mg , started pravastatin 20mg , patient also has new sliding scale for Novolog, patient states the hospital didn't sign the prescription for novolog so she has been using her Novalin- instructed patient that provider would be notified and we would follow up with patient with further instructions.   Allergies reviewed: Yes, no changes  Dietary changes reviewed: Yes, reviewed diabetic, heart health diet; understanding voiced.  Referrals reviewed: N/A    Functional Questionnaire:   Activities of Daily Living (ADLs):   She states they are independent in the following: ambulation, bathing and hygiene, feeding, continence, grooming, toileting and dressing States they require assistance with the following: none   Any transportation issues/concerns?: No, patient lives with spouse who provides transportation.    Any patient concerns? Yes, patient concerned regarding insulin. Hospital MD started her on Novolog per sliding scale, however the patient reports the prescription wasn't signed and the pharmacy wouldn't fill it. Patient has Novalin R at home and is wanting to know if she can take that with the same sliding scale that the hospitalist ordered. Message sent to Dr. Burnice Logan for clairification.     Confirmed importance and date/time of follow-up visits scheduled: Yes, patient to follow up with PCP on 03/23/17 at 1000; educated patient on importance of keeping appointment to help prevent rehospitalization; understanding voiced.   Confirmed with patient if condition begins to worsen call PCP or go to the ER.  Patient was given the Call-a-Nurse line [phone]: Yes, educated patient on s/s to seek emergent medical care, reviewed stroke symptoms; understanding voiced.

## 2017-03-22 NOTE — Telephone Encounter (Signed)
Yes.  Okay to use Novolin R.

## 2017-03-22 NOTE — Telephone Encounter (Signed)
Spoke to patient to advise of insulin recommendations per Dr. Raliegh Ip; patient voiced understanding; patient has not been checking fasting blood sugars in the mornings; educated patient on testing blood sugars fasting prior to meals and to be sure when checking before meals and at bedtime it has been at least two hours post meals to avoid false readings. Patient voiced understanding.

## 2017-03-23 ENCOUNTER — Ambulatory Visit (INDEPENDENT_AMBULATORY_CARE_PROVIDER_SITE_OTHER): Payer: Medicare HMO | Admitting: Internal Medicine

## 2017-03-23 ENCOUNTER — Encounter: Payer: Self-pay | Admitting: Internal Medicine

## 2017-03-23 VITALS — BP 132/68 | HR 72 | Temp 98.1°F | Wt 202.4 lb

## 2017-03-23 DIAGNOSIS — E1165 Type 2 diabetes mellitus with hyperglycemia: Secondary | ICD-10-CM

## 2017-03-23 DIAGNOSIS — I6330 Cerebral infarction due to thrombosis of unspecified cerebral artery: Secondary | ICD-10-CM

## 2017-03-23 DIAGNOSIS — I5022 Chronic systolic (congestive) heart failure: Secondary | ICD-10-CM | POA: Diagnosis not present

## 2017-03-23 DIAGNOSIS — E088 Diabetes mellitus due to underlying condition with unspecified complications: Secondary | ICD-10-CM

## 2017-03-23 DIAGNOSIS — I69322 Dysarthria following cerebral infarction: Secondary | ICD-10-CM

## 2017-03-23 DIAGNOSIS — Z794 Long term (current) use of insulin: Secondary | ICD-10-CM | POA: Diagnosis not present

## 2017-03-23 DIAGNOSIS — E0865 Diabetes mellitus due to underlying condition with hyperglycemia: Secondary | ICD-10-CM

## 2017-03-23 DIAGNOSIS — E118 Type 2 diabetes mellitus with unspecified complications: Secondary | ICD-10-CM | POA: Diagnosis not present

## 2017-03-23 DIAGNOSIS — R4781 Slurred speech: Secondary | ICD-10-CM

## 2017-03-23 DIAGNOSIS — IMO0002 Reserved for concepts with insufficient information to code with codable children: Secondary | ICD-10-CM

## 2017-03-23 MED ORDER — INSULIN ASPART 100 UNIT/ML ~~LOC~~ SOLN: 0.0000 [IU] | Freq: Three times a day (TID) | SUBCUTANEOUS | 6 refills | Status: DC

## 2017-03-23 MED ORDER — INSULIN ASPART 100 UNIT/ML ~~LOC~~ SOLN: 0.0000 [IU] | Freq: Three times a day (TID) | SUBCUTANEOUS | 0 refills | Status: DC

## 2017-03-23 NOTE — Patient Instructions (Addendum)
It is important that you exercise regularly, at least 20 minutes 3 to 4 times per week.  If you develop chest pain or shortness of breath seek  medical attention. Slowly improve your activity level  Endocrinology follow-up as scheduled  Report any new or worsening symptoms  Limit your sodium (Salt) intake   Please check your hemoglobin A1c every 3 months

## 2017-03-23 NOTE — Progress Notes (Signed)
Subjective:    Patient ID: Laurie [name], female    DOB: 10/23/1945, 71 y.o.   MRN: [id number]  HPI  Admit date: 03/17/2017 Discharge date: 03/20/2017  Recommendations for Outpatient Follow-up:  1. Please assess hypoglycemic agents as patient is under the impression that she is taking high-dose short-acting insulin at home. She has not required such high doses for blood sugar control. Will discharge with sliding-scale insulin regimen provided here in the hospital 2.  We'll hold the pressure medications as we suspect patient patient had infarct. Blood pressure should be normalized 5-7 days after hospital admission date. 3. Assess LDL    Discharge Diagnoses:  Active Problems:   Insulin controlled diabetes mellitus  with complications of neuropathy and vascular disease   CAD (coronary artery disease)   HIstory of Bell's palsy   Hyperlipidemia   Obesity (BMI 30-39.9)   Cardiac pacemaker  ([employer])   S/P CABG (coronary artery bypass graft)   Slurred speech   Stroke-like symptoms   Cerebral thrombosis with cerebral infarction   Discharge Condition: stable  Diet recommendation: heart healthy  Hospital Course:  Suspected left subcortical small infarct - Neurology saw and this was their evaluation and recommendations: Stroke: suspect left subcortical small infarct likely due to small vessel disease Not visualized on CT x 2  Resultant Right facial droop and right hand weakness  CT head: chronic lacunar infarcts including left mid pons, right CS and right BG   71 year old patient who has a history of diabetes, coronary artery disease who presented with slurred speech, right facial droop and right hand weakness. She is seen today for post hospital follow-up and for transitional care management  She was discharged from the hospital 3 days ago.  Hospital records reviewed. Prior to her admission.  The patient was on high-dose mealtime insulin.  Prandial insulin requirements  were noted to be much less as an inpatient and she was discharged on sliding scale regimen of NovoLog.  Blood sugars have been under reasonable control on present regimen.  Did have one high reading of 285  Since her discharge  Her neurological status has been stable  Past Medical History:  Diagnosis Date  . AICD (automatic cardioverter/defibrillator) present   . Arthritis    "hands; I think I've got some in my back" (03/17/2017)  . Bell's palsy 01/05/2010  . CAD (coronary artery disease)    Cath  07/21/1999  mild ostial, 80% stenosis proximal LAD, 90% stenosis proximal Diag 1, 95% stenosis proximal OM 1, 80% stenosis proximal RCA, 95% stenosis mid RCA    CABG 07/22/99  LIMA to LAD, SVG to dx, SVG to OM, SVG to PDA  Dr. Carmin Muskrat 2012 no ischemia EF 67%   . CHF (congestive heart failure) ([place]) dx'd 10/17/2015  . Chronic lower back pain   . Cystitis   . Heart murmur   . Hepatitis 1970s   "don't know which" (03/17/2017)  . HIstory of Bell's palsy    October 2012   . Hyperlipidemia    Intolerance to several statins   . Hypertension   . Hypertensive heart disease without CHF   . Multinodular goiter   . Obesity (BMI 30-39.9)   . OSA on CPAP    "suppose to wear a mask" (03/17/2017)  . Peripheral neuropathy   . Pneumonia 1990s X 1   "think i had walking pneumonia"  . Seasonal allergies   . Stroke ([place]) 03/2017   "light one"; denies residual on 03/17/2017)  .  Thyromegaly    mild  . Type II diabetes mellitus ([place])      Social History   Social History  . Marital status: Married    Spouse name: N/A  . Number of children: 2  . Years of education: N/A   Occupational History  .  Retired   Social History Main Topics  . Smoking status: Never Smoker  . Smokeless tobacco: Never Used  . Alcohol use No  . Drug use: No  . Sexual activity: No   Other Topics Concern  . Not on file   Social History Narrative   Does not work outside the home    Past Surgical History:  Procedure  Laterality Date  . BI-VENTRICULAR PACEMAKER UPGRADE  11/05/2015   "upgraded my pacemaker"  . BIOPSY THYROID  05/2010   percutaneous  . CARDIAC CATHETERIZATION N/A 10/21/2015   Procedure: Left Heart Cath and Cors/Grafts Angiography;  Surgeon: Belva Crome, MD;  Location: [facility];  Service: Cardiovascular;  Laterality: N/A;  . CARPAL TUNNEL RELEASE Bilateral   . CATARACT EXTRACTION Right 01/06/2015  . CATARACT EXTRACTION W/ INTRAOCULAR LENS IMPLANT Left 12/16/2014  . CORONARY ARTERY BYPASS GRAFT  2000  . CYSTECTOMY Right    "middle finger"  . DENTAL IMPLANTS    . EP IMPLANTABLE DEVICE N/A 11/05/2015   Procedure: BiV Upgrade;  Surgeon: Deboraha Sprang, MD;  Location: [facility];  Service: Cardiovascular;  Laterality: N/A;  . INSERT / REPLACE / REMOVE PACEMAKER    . LEAD REVISION N/A 10/25/2012   Procedure: LEAD REVISION;  Surgeon: Evans Lance, MD;  Location: [facility] CATH LAB;  Service: Cardiovascular;  Laterality: N/A;  . LEFT HEART CATHETERIZATION WITH CORONARY ANGIOGRAM N/A 10/23/2012   Procedure: LEFT HEART CATHETERIZATION WITH CORONARY ANGIOGRAM;  Surgeon: Jacolyn Reedy, MD;  Location: [facility] CATH LAB;  Service: Cardiovascular;  Laterality: N/A;  . PERMANENT PACEMAKER INSERTION N/A 10/23/2012   Procedure: PERMANENT PACEMAKER INSERTION;  Surgeon: Deboraha Sprang, MD;  Location: [facility] CATH LAB;  Service: Cardiovascular;  Laterality: N/A;  . PLANTAR FASCIA RELEASE Left    "& clipped a tendon that went thru bottom of my foot"  . TEMPORARY PACEMAKER INSERTION N/A 10/22/2012   Procedure: TEMPORARY PACEMAKER INSERTION;  Surgeon: Troy Sine, MD;  Location: [facility] CATH LAB;  Service: Cardiovascular;  Laterality: N/A;  . TUBAL LIGATION      Family History  Problem Relation Age of Onset  . Diabetes Father   . Heart disease Father   . Diabetes Mother   . Diabetes Other        Siblings and 2 children  . Heart disease Other        CAD  . Colon cancer Neg Hx   . Colon polyps Neg Hx      Allergies  Allergen Reactions  . Statins Other (See Comments)    Statins make the legs ache  . Tricor [Fenofibrate] Other (See Comments)    Leg pain  . Latex Itching and Other (See Comments)    Affected areas turn red, also-  . Tape Rash    Heart leads break out the skin    Current Outpatient Prescriptions on File Prior to Visit  Medication Sig Dispense Refill  . ACCU-CHEK FASTCLIX LANCETS MISC USE  TO CHECK BLOOD SUGAR FOUR TIMES DAILY 408 each 2  . ACCU-CHEK SMARTVIEW test strip USE  TO CHECK BLOOD SUGAR FOUR TIMES DAILY 400 each 1  . Alcohol Swabs (B-D  SINGLE USE SWABS REGULAR) PADS Use 8 daily for testing blood sugar and insulin injections. 800 each 1  . AMBULATORY NON FORMULARY MEDICATION "Omega Q Plus capsules" - Take 1 capsule by mouth once a day    . aspirin EC 325 MG EC tablet Take 1 tablet (325 mg total) by mouth daily. 30 tablet 0  . BD INSULIN SYRINGE ULTRAFINE 31G X 15/64" 1 ML MISC USE 10 TIMES A DAY WITH INSULIN AS DIRECTED 300 each 0  . beta carotene w/minerals (OCUVITE) tablet Take 1 tablet by mouth daily.    . Blood Glucose Calibration (ACCU-CHEK SMARTVIEW CONTROL) LIQD Use to calibrate blood glucose meter. 3 each 1  . cetirizine (ZYRTEC) 10 MG tablet Take 1 tablet (10 mg total) by mouth daily.    . cholecalciferol (VITAMIN D) 1000 UNITS tablet Take 2,000 Units by mouth daily.    . furosemide (LASIX) 40 MG tablet Take 2 tablets by mouth every morning and 1 tablet by mouth every evening. Take additional 1 tablet by mouth for wt gain of 3 lbs or more. (Patient taking differently: Take 20-40 mg by mouth See admin instructions. 40 mg in the morning and 20 mg in the evening and MAY ALSO TAKE AN ADDITIONAL 20 MG FOR A WEIGHT GAIN OF 3 POUNDS OR MORE) 360 tablet 3  . GLUCOSAMINE-CHONDROITIN PO Take 1 tablet by mouth daily.    [name] ketoconazole (NIZORAL) 2 % cream Apply 1 application topically daily. TO AFFECTED AREAS OF FACE    . nitroGLYCERIN (NITROSTAT) 0.4 MG SL tablet  Place 0.4 mg under the tongue as needed for chest pain.     . Omega-3 Fatty Acids (OMEGA-3 PO) Take 1 capsule by mouth daily.    [name] omeprazole (PRILOSEC) 20 MG capsule Take 20 mg by mouth daily as needed (for reflux symptoms).    . potassium chloride SA (KLOR-CON M20) 20 MEQ tablet Take 1 tablet (20 mEq total) by mouth daily. 180 tablet 2  . pravastatin (PRAVACHOL) 20 MG tablet Take 1 tablet (20 mg total) by mouth daily at 6 PM. 30 tablet 0  . Probiotic Product (PROBIOTIC DAILY) CAPS Take 1 capsule by mouth daily.    . psyllium (METAMUCIL) 58.6 % powder Take 1 packet by mouth 3 (three) times daily.    . Sodium Chloride-Sodium Bicarb (NETI POT SINUS WASH NA) As needed/as directed for sinus infections    . vitamin C (ASCORBIC ACID) 500 MG tablet Take 500 mg by mouth every other day.     No current facility-administered medications on file prior to visit.     BP 132/68 (BP Location: Left Arm, Patient Position: Sitting, Cuff Size: Normal)   Pulse 72   Temp 98.1 F (36.7 C) (Oral)   Wt 202 lb 6.4 oz (91.8 kg)   SpO2 96%   BMI 36.43 kg/m      Review of Systems  HENT: Negative for congestion, dental problem, hearing loss, rhinorrhea, sinus pressure, sore throat and tinnitus.   Eyes: Negative for pain, discharge and visual disturbance.  Respiratory: Negative for cough and shortness of breath.   Cardiovascular: Negative for chest pain, palpitations and leg swelling.  Gastrointestinal: Negative for abdominal distention, abdominal pain, blood in stool, constipation, diarrhea, nausea and vomiting.  Genitourinary: Negative for difficulty urinating, dysuria, flank pain, frequency, hematuria, pelvic pain, urgency, vaginal bleeding, vaginal discharge and vaginal pain.  Musculoskeletal: Negative for arthralgias, gait problem and joint swelling.  Skin: Negative for rash.  Neurological: Positive for speech difficulty and  weakness. Negative for dizziness, syncope, numbness and headaches.   Hematological: Negative for adenopathy.  Psychiatric/Behavioral: Negative for agitation, behavioral problems and dysphoric mood. The patient is not nervous/anxious.        Objective:   Physical Exam  Constitutional: She is oriented to person, place, and time. She appears well-developed and well-nourished.  Blood pressure 130/60  HENT:  Head: Normocephalic.  Right Ear: External ear normal.  Left Ear: External ear normal.  Mouth/Throat: Oropharynx is clear and moist.  Eyes: Conjunctivae and EOM are normal. Pupils are equal, round, and reactive to light.  Neck: Normal range of motion. Neck supple. No thyromegaly present.  Cardiovascular: Normal rate, regular rhythm, normal heart sounds and intact distal pulses.   Grade 2/6 systolic murmur  Pulmonary/Chest: Effort normal and breath sounds normal.  Sternotomy scar  Abdominal: Soft. Bowel sounds are normal. She exhibits no mass. There is no tenderness.  Musculoskeletal: Normal range of motion.  Lymphadenopathy:    She has no cervical adenopathy.  Neurological: She is alert and oriented to person, place, and time.  Mild slurred speech intermittently Finger to nose testing on the right, very mildly impaired No drift No sensory deficits  Skin: Skin is warm and dry. No rash noted.  Psychiatric: She has a normal mood and affect. Her behavior is normal.          Assessment & Plan:   Status post subcortical left brain stroke.  Patient continues to have neurological improvement.  Follow-up neurology as scheduled Diabetes mellitus.  Blood sugars have been under reasonable control on present sliding scale regimen.  Patient is scheduled to see endocrinology next month.  No change in insulin therapy at this time Hypertension, well-controlled Coronary artery disease History of obesity, OSA weight loss encouraged  Follow-up 3 months  Cele Mote Pilar Plate

## 2017-04-13 ENCOUNTER — Telehealth: Payer: Self-pay | Admitting: Endocrinology

## 2017-04-13 NOTE — Telephone Encounter (Signed)
Called patient and had her resume Dr. Cordelia Pen dose for her. I told her to call back in the next couple days to let us know the blood sugar readings.

## 2017-04-13 NOTE — Telephone Encounter (Signed)
Patient was in hospital for stroke and PCP changed her insulin dose to sliding scale. Patient states that ever since she hasn't seen readings under 300. She was wondering if she should resume the dose initially prescribed by Dr. Loanne Drilling even though PCP thought the dose was too high? This is what was prescribed in Dr. Cordelia Pen note: Please increase the reg insulin to 170 at breakfast, 170 at lunch, and 160 at dinner.   Please come back for a follow-up appointment in 3 months.

## 2017-04-13 NOTE — Telephone Encounter (Signed)
I think she should increase the doses towards those doses, but gradually.

## 2017-04-13 NOTE — Telephone Encounter (Signed)
Patient called in reference to insulin insulin aspart (NOVOLOG) 100 UNIT/ML injection. Patient seems to think this insulin is not helping. Blood sugar is running higher than normal. Please call patient and advise. OK to leave message.

## 2017-04-21 ENCOUNTER — Other Ambulatory Visit: Payer: Self-pay | Admitting: Internal Medicine

## 2017-04-21 MED ORDER — PRAVASTATIN SODIUM 20 MG PO TABS: 20.0000 mg | ORAL_TABLET | Freq: Every day | ORAL | 0 refills | Status: DC

## 2017-04-23 ENCOUNTER — Other Ambulatory Visit: Payer: Self-pay | Admitting: Internal Medicine

## 2017-04-28 DIAGNOSIS — R0789 Other chest pain: Secondary | ICD-10-CM | POA: Diagnosis not present

## 2017-04-28 DIAGNOSIS — E1121 Type 2 diabetes mellitus with diabetic nephropathy: Secondary | ICD-10-CM | POA: Diagnosis not present

## 2017-04-28 DIAGNOSIS — I5022 Chronic systolic (congestive) heart failure: Secondary | ICD-10-CM | POA: Diagnosis not present

## 2017-04-28 DIAGNOSIS — Z951 Presence of aortocoronary bypass graft: Secondary | ICD-10-CM | POA: Diagnosis not present

## 2017-04-28 DIAGNOSIS — I2510 Atherosclerotic heart disease of native coronary artery without angina pectoris: Secondary | ICD-10-CM | POA: Diagnosis not present

## 2017-04-28 DIAGNOSIS — Z95810 Presence of automatic (implantable) cardiac defibrillator: Secondary | ICD-10-CM | POA: Diagnosis not present

## 2017-04-28 DIAGNOSIS — E668 Other obesity: Secondary | ICD-10-CM | POA: Diagnosis not present

## 2017-04-28 DIAGNOSIS — E785 Hyperlipidemia, unspecified: Secondary | ICD-10-CM | POA: Diagnosis not present

## 2017-04-28 DIAGNOSIS — I119 Hypertensive heart disease without heart failure: Secondary | ICD-10-CM | POA: Diagnosis not present

## 2017-05-04 ENCOUNTER — Other Ambulatory Visit: Payer: Self-pay | Admitting: Endocrinology

## 2017-05-10 ENCOUNTER — Ambulatory Visit (INDEPENDENT_AMBULATORY_CARE_PROVIDER_SITE_OTHER): Payer: Medicare HMO | Admitting: Endocrinology

## 2017-05-10 ENCOUNTER — Encounter: Payer: Self-pay | Admitting: Endocrinology

## 2017-05-10 ENCOUNTER — Ambulatory Visit (INDEPENDENT_AMBULATORY_CARE_PROVIDER_SITE_OTHER): Payer: Medicare HMO | Admitting: *Deleted

## 2017-05-10 VITALS — BP 140/74 | HR 80 | Wt 203.4 lb

## 2017-05-10 DIAGNOSIS — E1140 Type 2 diabetes mellitus with diabetic neuropathy, unspecified: Secondary | ICD-10-CM | POA: Diagnosis not present

## 2017-05-10 DIAGNOSIS — I428 Other cardiomyopathies: Secondary | ICD-10-CM

## 2017-05-10 DIAGNOSIS — Z794 Long term (current) use of insulin: Secondary | ICD-10-CM

## 2017-05-10 LAB — POCT GLYCOSYLATED HEMOGLOBIN (HGB A1C): Hemoglobin A1C: 8.8

## 2017-05-10 NOTE — Progress Notes (Signed)
Remote ICD transmission.   

## 2017-05-10 NOTE — Patient Instructions (Addendum)
check your blood sugar 4 times a day--before the 3 meals, and at bedtime (the bedtime reading is important, as we may need to increase the suppertime insulin).  also check if you have symptoms of your blood sugar being too high or too low.  please keep a record of the readings and bring it to your next appointment here.  please call us sooner if you are having low blood sugar episodes.  Please continue the same regular insulin: 170 at breakfast, 170 at lunch, and 160 at dinner.   blood tests are requested for you today.  We'll let you know about the results.    Please come back for a follow-up appointment in 2 months.

## 2017-05-10 NOTE — Progress Notes (Signed)
Subjective:    Patient ID: Laurie [name], female    DOB: May 29, 1946, 71 y.o.   MRN: [id number]  HPI Pt returns for f/u of diabetes mellitus:   DM type: insulin-requiring type 2 Dx'ed: 7096 Complications: peripheral sensory neuropathy, CVA, and CAD.   Therapy: insulin since 2011.   GDM: 1985.   DKA: never Severe hypoglycemia: never.   Pancreatitis: never.   Other: she has severe insulin resistance; she required very little insulin during a hospitalization in early 2014; she wants to delay weight-loss surgery for now; she takes human insulin, due to cost; she has long duration of action of insulin, and based on pattern of cbg's, does not need basal insulin.    Interval history:  pt states she feels well in general.  approx  7 weeks ago, she was in the hospital with CVA.  She was dx'ed home on lower dosage.  She called with very high cbg's, so it was re-increased to previous outpatient dosage.  she brings a record of her cbg's which I have reviewed today.  it varies from 58-200's.  It is lowest fasting, but she does not check at HS.  Pt also has multinodular goiter (dx'ed 2011; bx then showed HYPERPLASTIC LESION; she has been euthyroid; f/u US in 2017 suggested need for another [place] in late 2018).    Past Medical History:  Diagnosis Date  . AICD (automatic cardioverter/defibrillator) present   . Arthritis    "hands; I think I've got some in my back" (03/17/2017)  . Bell's palsy 01/05/2010  . CAD (coronary artery disease)    Cath  07/21/1999  mild ostial, 80% stenosis proximal LAD, 90% stenosis proximal Diag 1, 95% stenosis proximal OM 1, 80% stenosis proximal RCA, 95% stenosis mid RCA    CABG 07/22/99  LIMA to LAD, SVG to dx, SVG to OM, SVG to PDA  Dr. Carmin Muskrat 2012 no ischemia EF 67%   . CHF (congestive heart failure) ([place]) dx'd 10/17/2015  . Chronic lower back pain   . Cystitis   . Heart murmur   . Hepatitis 1970s   "don't know which" (03/17/2017)  . HIstory of Bell's palsy    October 2012   . Hyperlipidemia    Intolerance to several statins   . Hypertension   . Hypertensive heart disease without CHF   . Multinodular goiter   . Obesity (BMI 30-39.9)   . OSA on CPAP    "suppose to wear a mask" (03/17/2017)  . Peripheral neuropathy   . Pneumonia 1990s X 1   "think i had walking pneumonia"  . Seasonal allergies   . Stroke ([place]) 03/2017   "light one"; denies residual on 03/17/2017)  . Thyromegaly    mild  . Type II diabetes mellitus ([place])     Past Surgical History:  Procedure Laterality Date  . BI-VENTRICULAR PACEMAKER UPGRADE  11/05/2015   "upgraded my pacemaker"  . BIOPSY THYROID  05/2010   percutaneous  . CARDIAC CATHETERIZATION N/A 10/21/2015   Procedure: Left Heart Cath and Cors/Grafts Angiography;  Surgeon: Belva Crome, MD;  Location: [place] [facility];  Service: Cardiovascular;  Laterality: N/A;  . CARPAL TUNNEL RELEASE Bilateral   . CATARACT EXTRACTION Right 01/06/2015  . CATARACT EXTRACTION W/ INTRAOCULAR LENS IMPLANT Left 12/16/2014  . CORONARY ARTERY BYPASS GRAFT  2000  . CYSTECTOMY Right    "middle finger"  . DENTAL IMPLANTS    . EP IMPLANTABLE DEVICE N/A 11/05/2015   Procedure: BiV  Upgrade;  Surgeon: Deboraha Sprang, MD;  Location: [place] [facility];  Service: Cardiovascular;  Laterality: N/A;  . INSERT / REPLACE / REMOVE PACEMAKER    . LEAD REVISION N/A 10/25/2012   Procedure: LEAD REVISION;  Surgeon: Evans Lance, MD;  Location: [facility] CATH LAB;  Service: Cardiovascular;  Laterality: N/A;  . LEFT HEART CATHETERIZATION WITH CORONARY ANGIOGRAM N/A 10/23/2012   Procedure: LEFT HEART CATHETERIZATION WITH CORONARY ANGIOGRAM;  Surgeon: Jacolyn Reedy, MD;  Location: [facility] CATH LAB;  Service: Cardiovascular;  Laterality: N/A;  . PERMANENT PACEMAKER INSERTION N/A 10/23/2012   Procedure: PERMANENT PACEMAKER INSERTION;  Surgeon: Deboraha Sprang, MD;  Location: [facility] CATH LAB;  Service: Cardiovascular;  Laterality: N/A;  . PLANTAR FASCIA RELEASE Left    "&  clipped a tendon that went thru bottom of my foot"  . TEMPORARY PACEMAKER INSERTION N/A 10/22/2012   Procedure: TEMPORARY PACEMAKER INSERTION;  Surgeon: Troy Sine, MD;  Location: [facility] CATH LAB;  Service: Cardiovascular;  Laterality: N/A;  . TUBAL LIGATION      Social History   Social History  . Marital status: Married    Spouse name: N/A  . Number of children: 2  . Years of education: N/A   Occupational History  .  Retired   Social History Main Topics  . Smoking status: Never Smoker  . Smokeless tobacco: Never Used  . Alcohol use No  . Drug use: No  . Sexual activity: No   Other Topics Concern  . Not on file   Social History Narrative   Does not work outside the home    Current Outpatient Prescriptions on File Prior to Visit  Medication Sig Dispense Refill  . ACCU-CHEK FASTCLIX LANCETS MISC USE  TO CHECK BLOOD SUGAR FOUR TIMES DAILY 408 each 2  . ACCU-CHEK SMARTVIEW test strip USE  TO CHECK BLOOD SUGAR FOUR TIMES DAILY 400 each 1  . Alcohol Swabs (B-D SINGLE USE SWABS REGULAR) PADS Use 8 daily for testing blood sugar and insulin injections. 800 each 1  . AMBULATORY NON FORMULARY MEDICATION "Omega Q Plus capsules" - Take 1 capsule by mouth once a day    . aspirin EC 325 MG EC tablet Take 1 tablet (325 mg total) by mouth daily. 30 tablet 0  . BD VEO INSULIN SYR ULTRAFINE 31G X 15/64" 1 ML MISC USE 10 TIMES A DAY WITH INSULIN AS DIRECTED 300 each 0  . beta carotene w/minerals (OCUVITE) tablet Take 1 tablet by mouth daily.    . Blood Glucose Calibration (ACCU-CHEK SMARTVIEW CONTROL) LIQD Use to calibrate blood glucose meter. 3 each 1  . cetirizine (ZYRTEC) 10 MG tablet Take 1 tablet (10 mg total) by mouth daily.    . cholecalciferol (VITAMIN D) 1000 UNITS tablet Take 2,000 Units by mouth daily.    . furosemide (LASIX) 40 MG tablet TAKE 2 TABLETS EVERY MORNING AND 1 TABLET EVERY EVENING. TAKE AN ADDITIONAL 1 TABLET FOR WEIGHT GAIN OF 3 LBS OR MORE  360 tablet 2  .  GLUCOSAMINE-CHONDROITIN PO Take 1 tablet by mouth daily.    [name] ketoconazole (NIZORAL) 2 % cream Apply 1 application topically daily. TO AFFECTED AREAS OF FACE    . nitroGLYCERIN (NITROSTAT) 0.4 MG SL tablet Place 0.4 mg under the tongue as needed for chest pain.     . Omega-3 Fatty Acids (OMEGA-3 PO) Take 1 capsule by mouth daily.    . potassium chloride SA (KLOR-CON M20) 20 MEQ tablet Take 1 tablet (  20 mEq total) by mouth daily. 180 tablet 2  . pravastatin (PRAVACHOL) 20 MG tablet Take 1 tablet (20 mg total) by mouth daily at 6 PM. 30 tablet 0  . Probiotic Product (PROBIOTIC DAILY) CAPS Take 1 capsule by mouth daily.    . psyllium (METAMUCIL) 58.6 % powder Take 1 packet by mouth 3 (three) times daily.    . Sodium Chloride-Sodium Bicarb (NETI POT SINUS WASH NA) As needed/as directed for sinus infections    . vitamin C (ASCORBIC ACID) 500 MG tablet Take 500 mg by mouth every other day.    . insulin aspart (NOVOLOG) 100 UNIT/ML injection Inject 0-15 Units into the skin 3 (three) times daily with meals. Per hospital sliding scale. Will have nursing provide on discharge (Patient not taking: Reported on 05/10/2017) 10 mL 6  . omeprazole (PRILOSEC) 20 MG capsule Take 20 mg by mouth daily as needed (for reflux symptoms).     No current facility-administered medications on file prior to visit.     Allergies  Allergen Reactions  . Statins Other (See Comments)    Statins make the legs ache  . Tricor [Fenofibrate] Other (See Comments)    Leg pain  . Latex Itching and Other (See Comments)    Affected areas turn red, also-  . Tape Rash    Heart leads break out the skin    Family History  Problem Relation Age of Onset  . Diabetes Father   . Heart disease Father   . Diabetes Mother   . Diabetes Other        Siblings and 2 children  . Heart disease Other        CAD  . Colon cancer Neg Hx   . Colon polyps Neg Hx     BP 140/74   Pulse 80   Wt 203 lb 6.4 oz (92.3 kg)   SpO2 93%   BMI 36.61  kg/m    Review of Systems Denies LOC    Objective:   Physical Exam VITAL SIGNS:  See vs page.  GENERAL: no distress Pulses: dorsalis pedis intact bilat.  Skin: no ulcer on the feet. feet are of normal color and temp. There is a healed vein harvest scar at the right leg.  Feet: no deformity.  CV: trace bilat leg edema.  Neuro: sensation is intact to touch on the feet,but decreased from normal.     Lab Results  Component Value Date   HGBA1C 8.8 05/10/2017      Assessment & Plan:  Insulin-requiring type 2 DM, with CAD: glycemic control is improved since back on outpt dosage.    Patient Instructions  check your blood sugar 4 times a day--before the 3 meals, and at bedtime (the bedtime reading is important, as we may need to increase the suppertime insulin).  also check if you have symptoms of your blood sugar being too high or too low.  please keep a record of the readings and bring it to your next appointment here.  please call us sooner if you are having low blood sugar episodes.  Please continue the same regular insulin: 170 at breakfast, 170 at lunch, and 160 at dinner.   blood tests are requested for you today.  We'll let you know about the results.    Please come back for a follow-up appointment in 2 months.

## 2017-05-12 LAB — FRUCTOSAMINE: Fructosamine: 363 umol/L — ABNORMAL HIGH (ref 190–270)

## 2017-05-13 ENCOUNTER — Telehealth: Payer: Self-pay | Admitting: Endocrinology

## 2017-05-13 ENCOUNTER — Encounter: Payer: Self-pay | Admitting: Endocrinology

## 2017-05-13 NOTE — Telephone Encounter (Signed)
Patient calling to notify that she is taking Novolin R 3 time a day before meals. Dr. Loanne Drilling inquired.   Ty,  -LL

## 2017-05-13 NOTE — Telephone Encounter (Signed)
[place], thanks.  We'll correct in the computer.

## 2017-05-14 ENCOUNTER — Other Ambulatory Visit: Payer: Self-pay | Admitting: Endocrinology

## 2017-05-14 MED ORDER — INSULIN REGULAR HUMAN 100 UNIT/ML IJ SOLN: INTRAMUSCULAR | 11 refills | Status: DC

## 2017-05-20 ENCOUNTER — Encounter: Payer: Self-pay | Admitting: Cardiology

## 2017-05-21 ENCOUNTER — Other Ambulatory Visit: Payer: Self-pay | Admitting: Internal Medicine

## 2017-05-27 LAB — CUP PACEART INCLINIC DEVICE CHECK
Implantable Lead Location: 753859
Implantable Lead Location: 753860
Implantable Lead Model: 4298
Implantable Lead Model: 7122
MDC IDC LEAD IMPLANT DT: 20140210
MDC IDC LEAD IMPLANT DT: 20170222
MDC IDC LEAD IMPLANT DT: 20170222
MDC IDC LEAD LOCATION: 753858
MDC IDC PG IMPLANT DT: 20170222
MDC IDC SESS DTM: 20180914122847
Pulse Gen Serial Number: 7341405

## 2017-06-02 ENCOUNTER — Encounter: Payer: Self-pay | Admitting: Internal Medicine

## 2017-06-02 ENCOUNTER — Other Ambulatory Visit: Payer: Self-pay | Admitting: Endocrinology

## 2017-06-20 ENCOUNTER — Other Ambulatory Visit: Payer: Self-pay

## 2017-06-20 MED ORDER — "INSULIN SYRINGE-NEEDLE U-100 31G X 15/64"" 1 ML MISC": 0 refills | Status: DC

## 2017-06-23 ENCOUNTER — Ambulatory Visit (INDEPENDENT_AMBULATORY_CARE_PROVIDER_SITE_OTHER): Payer: Medicare HMO | Admitting: Internal Medicine

## 2017-06-23 ENCOUNTER — Encounter: Payer: Self-pay | Admitting: Internal Medicine

## 2017-06-23 VITALS — BP 138/64 | HR 99 | Temp 98.4°F | Ht 62.5 in | Wt 205.8 lb

## 2017-06-23 DIAGNOSIS — I6330 Cerebral infarction due to thrombosis of unspecified cerebral artery: Secondary | ICD-10-CM | POA: Diagnosis not present

## 2017-06-23 DIAGNOSIS — I119 Hypertensive heart disease without heart failure: Secondary | ICD-10-CM | POA: Diagnosis not present

## 2017-06-23 DIAGNOSIS — I5022 Chronic systolic (congestive) heart failure: Secondary | ICD-10-CM

## 2017-06-23 DIAGNOSIS — Z794 Long term (current) use of insulin: Secondary | ICD-10-CM

## 2017-06-23 DIAGNOSIS — Z23 Encounter for immunization: Secondary | ICD-10-CM

## 2017-06-23 DIAGNOSIS — E1140 Type 2 diabetes mellitus with diabetic neuropathy, unspecified: Secondary | ICD-10-CM

## 2017-06-23 MED ORDER — PRAVASTATIN SODIUM 20 MG PO TABS: ORAL_TABLET | ORAL | 6 refills | Status: DC

## 2017-06-23 NOTE — Progress Notes (Signed)
Subjective:    Patient ID: Laurie [name], female    DOB: [birth year], 71 y.o.   MRN: [id number]  HPI  71 year old patient is seen today for general follow-up. She has a history of diabetes with high insulin requirements and is followed by endocrinology. She was hospitalized 3 months ago for cerebrovascular disease.  She has done well and her slurred speech has resolved She has coronary artery disease which has been stable.  She remains on statin therapy and aspirin 325 mg.  No focal neurological or cardiac symptoms. In general doing quite well  Flu vaccine administered  Lab Results  Component Value Date   HGBA1C 8.8 05/10/2017    Past Medical History:  Diagnosis Date  . AICD (automatic cardioverter/defibrillator) present   . Arthritis    "hands; I think I've got some in my back" (03/17/2017)  . Bell's palsy 01/05/2010  . CAD (coronary artery disease)    Cath  07/21/1999  mild ostial, 80% stenosis proximal LAD, 90% stenosis proximal Diag 1, 95% stenosis proximal OM 1, 80% stenosis proximal RCA, 95% stenosis mid RCA    CABG 07/22/99  LIMA to LAD, SVG to dx, SVG to OM, SVG to PDA  Dr. Carmin Muskrat 2012 no ischemia EF 67%   . CHF (congestive heart failure) ([place]) dx'd 10/17/2015  . Chronic lower back pain   . Cystitis   . Heart murmur   . Hepatitis 1970s   "don't know which" (03/17/2017)  . HIstory of Bell's palsy    October 2012   . Hyperlipidemia    Intolerance to several statins   . Hypertension   . Hypertensive heart disease without CHF   . Multinodular goiter   . Obesity (BMI 30-39.9)   . OSA on CPAP    "suppose to wear a mask" (03/17/2017)  . Peripheral neuropathy   . Pneumonia 1990s X 1   "think i had walking pneumonia"  . Seasonal allergies   . Stroke ([place]) 03/2017   "light one"; denies residual on 03/17/2017)  . Thyromegaly    mild  . Type II diabetes mellitus ([place])      Social History   Social History  . Marital status: Married    Spouse name: N/A  .  Number of children: 2  . Years of education: N/A   Occupational History  .  Retired   Social History Main Topics  . Smoking status: Never Smoker  . Smokeless tobacco: Never Used  . Alcohol use No  . Drug use: No  . Sexual activity: No   Other Topics Concern  . Not on file   Social History Narrative   Does not work outside the home    Past Surgical History:  Procedure Laterality Date  . BI-VENTRICULAR PACEMAKER UPGRADE  11/05/2015   "upgraded my pacemaker"  . BIOPSY THYROID  05/2010   percutaneous  . CARDIAC CATHETERIZATION N/A 10/21/2015   Procedure: Left Heart Cath and Cors/Grafts Angiography;  Surgeon: Belva Crome, MD;  Location: [place] [facility];  Service: Cardiovascular;  Laterality: N/A;  . CARPAL TUNNEL RELEASE Bilateral   . CATARACT EXTRACTION Right 01/06/2015  . CATARACT EXTRACTION W/ INTRAOCULAR LENS IMPLANT Left 12/16/2014  . CORONARY ARTERY BYPASS GRAFT  2000  . CYSTECTOMY Right    "middle finger"  . DENTAL IMPLANTS    . EP IMPLANTABLE DEVICE N/A 11/05/2015   Procedure: BiV Upgrade;  Surgeon: Deboraha Sprang, MD;  Location: [place] [facility] LAB;  Service: Cardiovascular;  Laterality: N/A;  . INSERT / REPLACE / REMOVE PACEMAKER    . LEAD REVISION N/A 10/25/2012   Procedure: LEAD REVISION;  Surgeon: Evans Lance, MD;  Location: [facility] CATH LAB;  Service: Cardiovascular;  Laterality: N/A;  . LEFT HEART CATHETERIZATION WITH CORONARY ANGIOGRAM N/A 10/23/2012   Procedure: LEFT HEART CATHETERIZATION WITH CORONARY ANGIOGRAM;  Surgeon: Jacolyn Reedy, MD;  Location: [facility] CATH LAB;  Service: Cardiovascular;  Laterality: N/A;  . PERMANENT PACEMAKER INSERTION N/A 10/23/2012   Procedure: PERMANENT PACEMAKER INSERTION;  Surgeon: Deboraha Sprang, MD;  Location: [facility] CATH LAB;  Service: Cardiovascular;  Laterality: N/A;  . PLANTAR FASCIA RELEASE Left    "& clipped a tendon that went thru bottom of my foot"  . TEMPORARY PACEMAKER INSERTION N/A 10/22/2012   Procedure: TEMPORARY PACEMAKER  INSERTION;  Surgeon: Troy Sine, MD;  Location: [facility] CATH LAB;  Service: Cardiovascular;  Laterality: N/A;  . TUBAL LIGATION      Family History  Problem Relation Age of Onset  . Diabetes Father   . Heart disease Father   . Diabetes Mother   . Diabetes Other        Siblings and 2 children  . Heart disease Other        CAD  . Colon cancer Neg Hx   . Colon polyps Neg Hx     Allergies  Allergen Reactions  . Statins Other (See Comments)    Statins make the legs ache  . Tricor [Fenofibrate] Other (See Comments)    Leg pain  . Latex Itching and Other (See Comments)    Affected areas turn red, also-  . Tape Rash    Heart leads break out the skin    Current Outpatient Prescriptions on File Prior to Visit  Medication Sig Dispense Refill  . ACCU-CHEK FASTCLIX LANCETS MISC USE  TO CHECK BLOOD SUGAR FOUR TIMES DAILY 408 each 2  . ACCU-CHEK SMARTVIEW test strip USE  TO CHECK BLOOD SUGAR FOUR TIMES DAILY 400 each 1  . Alcohol Swabs (B-D SINGLE USE SWABS REGULAR) PADS Use 8 daily for testing blood sugar and insulin injections. 800 each 1  . AMBULATORY NON FORMULARY MEDICATION "Omega Q Plus capsules" - Take 1 capsule by mouth once a day    . aspirin EC 325 MG EC tablet Take 1 tablet (325 mg total) by mouth daily. 30 tablet 0  . beta carotene w/minerals (OCUVITE) tablet Take 1 tablet by mouth daily.    . Blood Glucose Calibration (ACCU-CHEK SMARTVIEW CONTROL) LIQD Use to calibrate blood glucose meter. 3 each 1  . cetirizine (ZYRTEC) 10 MG tablet Take 1 tablet (10 mg total) by mouth daily.    . cholecalciferol (VITAMIN D) 1000 UNITS tablet Take 2,000 Units by mouth daily.    . furosemide (LASIX) 40 MG tablet TAKE 2 TABLETS EVERY MORNING AND 1 TABLET EVERY EVENING. TAKE AN ADDITIONAL 1 TABLET FOR WEIGHT GAIN OF 3 LBS OR MORE  360 tablet 2  . GLUCOSAMINE-CHONDROITIN PO Take 1 tablet by mouth daily.    . insulin regular (HUMULIN R) 100 units/mL injection 170 at breakfast, 170 at lunch, and 160  at dinner. 170 mL 11  . Insulin Syringe-Needle U-100 (BD VEO INSULIN SYR ULTRAFINE) 31G X 15/64" 1 ML MISC USE 10 TIMES A DAY WITH INSULIN AS DIRECTED 900 each 0  . ketoconazole (NIZORAL) 2 % cream Apply 1 application topically daily. TO AFFECTED AREAS OF FACE    . nitroGLYCERIN (NITROSTAT) 0.4 MG SL  tablet Place 0.4 mg under the tongue as needed for chest pain.     . Omega-3 Fatty Acids (OMEGA-3 PO) Take 1 capsule by mouth daily.    [name] omeprazole (PRILOSEC) 20 MG capsule Take 20 mg by mouth daily as needed (for reflux symptoms).    . potassium chloride SA (KLOR-CON M20) 20 MEQ tablet Take 1 tablet (20 mEq total) by mouth daily. 180 tablet 2  . Probiotic Product (PROBIOTIC DAILY) CAPS Take 1 capsule by mouth daily.    . psyllium (METAMUCIL) 58.6 % powder Take 1 packet by mouth 3 (three) times daily.    . Sodium Chloride-Sodium Bicarb (NETI POT SINUS WASH NA) As needed/as directed for sinus infections    . vitamin C (ASCORBIC ACID) 500 MG tablet Take 500 mg by mouth every other day.     No current facility-administered medications on file prior to visit.     BP 138/64 (BP Location: Left Arm, Patient Position: Sitting, Cuff Size: Normal)   Pulse 99   Temp 98.4 F (36.9 C) (Oral)   Ht 5' 2.5" (1.588 m)   Wt 205 lb 12.8 oz (93.4 kg)   SpO2 92%   BMI 37.04 kg/m     Review of Systems  Constitutional: Positive for fatigue.  HENT: Negative for congestion, dental problem, hearing loss, rhinorrhea, sinus pressure, sore throat and tinnitus.   Eyes: Negative for pain, discharge and visual disturbance.  Respiratory: Negative for cough and shortness of breath.   Cardiovascular: Negative for chest pain, palpitations and leg swelling.  Gastrointestinal: Negative for abdominal distention, abdominal pain, blood in stool, constipation, diarrhea, nausea and vomiting.  Genitourinary: Negative for difficulty urinating, dysuria, flank pain, frequency, hematuria, pelvic pain, urgency, vaginal bleeding,  vaginal discharge and vaginal pain.  Musculoskeletal: Negative for arthralgias, gait problem and joint swelling.  Skin: Negative for rash.  Neurological: Negative for dizziness, syncope, speech difficulty, weakness, numbness and headaches.  Hematological: Negative for adenopathy.  Psychiatric/Behavioral: Negative for agitation, behavioral problems and dysphoric mood. The patient is not nervous/anxious.        Objective:   Physical Exam  Constitutional: She is oriented to person, place, and time. She appears well-developed and well-nourished.  Weight 205 Blood pressure well controlled  HENT:  Head: Normocephalic.  Right Ear: External ear normal.  Left Ear: External ear normal.  Mouth/Throat: Oropharynx is clear and moist.  Eyes: Pupils are equal, round, and reactive to light. Conjunctivae and EOM are normal.  Neck: Normal range of motion. Neck supple. No thyromegaly present.  Cardiovascular: Normal rate, regular rhythm, normal heart sounds and intact distal pulses.   Pulmonary/Chest: Effort normal and breath sounds normal.  Abdominal: Soft. Bowel sounds are normal. She exhibits no mass. There is no tenderness.  Musculoskeletal: Normal range of motion.  Lymphadenopathy:    She has no cervical adenopathy.  Neurological: She is alert and oriented to person, place, and time.  Skin: Skin is warm and dry. No rash noted.  Psychiatric: She has a normal mood and affect. Her behavior is normal.          Assessment & Plan:   Diabetes mellitus.  Follow-up endocrinology Essential hypertension, stable Cerebrovascular disease.  Remains stable.  No new neurological symptoms.  Will continue aggressive risk factor modification  Flu vaccine administered Follow-up 6 months  Adeja Sarratt Pilar Plate

## 2017-06-23 NOTE — Patient Instructions (Signed)
Please see your eye doctor yearly to check for diabetic eye damage  Limit your sodium (Salt) intake   Please check your hemoglobin A1c every 3 months  Return in 6 months for follow-up    It is important that you exercise regularly, at least 20 minutes 3 to 4 times per week.  If you develop chest pain or shortness of breath seek  medical attention.  Endocrine follow up as scheduled

## 2017-06-25 ENCOUNTER — Other Ambulatory Visit: Payer: Self-pay | Admitting: Internal Medicine

## 2017-07-11 ENCOUNTER — Encounter: Payer: Self-pay | Admitting: Endocrinology

## 2017-07-11 ENCOUNTER — Ambulatory Visit (INDEPENDENT_AMBULATORY_CARE_PROVIDER_SITE_OTHER): Payer: Medicare HMO | Admitting: Endocrinology

## 2017-07-11 VITALS — BP 122/64 | HR 81 | Wt 204.0 lb

## 2017-07-11 DIAGNOSIS — E118 Type 2 diabetes mellitus with unspecified complications: Secondary | ICD-10-CM

## 2017-07-11 DIAGNOSIS — E042 Nontoxic multinodular goiter: Secondary | ICD-10-CM | POA: Diagnosis not present

## 2017-07-11 DIAGNOSIS — E1165 Type 2 diabetes mellitus with hyperglycemia: Secondary | ICD-10-CM | POA: Diagnosis not present

## 2017-07-11 DIAGNOSIS — IMO0002 Reserved for concepts with insufficient information to code with codable children: Secondary | ICD-10-CM

## 2017-07-11 LAB — POCT GLYCOSYLATED HEMOGLOBIN (HGB A1C): Hemoglobin A1C: 7.5

## 2017-07-11 MED ORDER — INSULIN REGULAR HUMAN 100 UNIT/ML IJ SOLN: INTRAMUSCULAR | 11 refills | Status: DC

## 2017-07-11 NOTE — Patient Instructions (Addendum)
check your blood sugar 4 times a day--before the 3 meals, and at bedtime (the bedtime reading is important, as we may need to increase the suppertime insulin).  also check if you have symptoms of your blood sugar being too high or too low.  please keep a record of the readings and bring it to your next appointment here.  please call us sooner if you are having low blood sugar episodes.  Please increase the insulin to 170 at breakfast, 170 at lunch, and 170 at dinner.   Please come back for a follow-up appointment in 3 months.

## 2017-07-11 NOTE — Progress Notes (Signed)
Subjective:    Patient ID: Laurie [name], female    DOB: Apr 29, 1946, 70 y.o.   MRN: [id number]  HPI Pt returns for f/u of diabetes mellitus:   DM type: insulin-requiring type 2 Dx'ed: 2703 Complications: peripheral sensory neuropathy, CVA, and CAD.   Therapy: insulin since 2011.   GDM: 1985.   DKA: never Severe hypoglycemia: never.   Pancreatitis: never.   Other: she has severe insulin resistance; she required very little insulin during a hospitalization in early 2014; she wants to delay weight-loss surgery for now; she takes human insulin, due to cost; she has long duration of action of insulin, and based on pattern of cbg's, does not need basal insulin.    Interval history: she brings a record of her cbg's which I have reviewed today.  It varies from 100-350.  It is highest at HS, and lowest fasting.  pt states she feels well in general. Pt also has multinodular goiter (dx'ed 2011; bx then showed HYPERPLASTIC LESION; she has been euthyroid; f/u US in 2017 suggested need for another [place] in late 2018).    Past Medical History:  Diagnosis Date  . AICD (automatic cardioverter/defibrillator) present   . Arthritis    "hands; I think I've got some in my back" (03/17/2017)  . Bell's palsy 01/05/2010  . CAD (coronary artery disease)    Cath  07/21/1999  mild ostial, 80% stenosis proximal LAD, 90% stenosis proximal Diag 1, 95% stenosis proximal OM 1, 80% stenosis proximal RCA, 95% stenosis mid RCA    CABG 07/22/99  LIMA to LAD, SVG to dx, SVG to OM, SVG to PDA  Dr. Carmin Muskrat 2012 no ischemia EF 67%   . CHF (congestive heart failure) ([place]) dx'd 10/17/2015  . Chronic lower back pain   . Cystitis   . Heart murmur   . Hepatitis 1970s   "don't know which" (03/17/2017)  . HIstory of Bell's palsy    October 2012   . Hyperlipidemia    Intolerance to several statins   . Hypertension   . Hypertensive heart disease without CHF   . Multinodular goiter   . Obesity (BMI 30-39.9)   . OSA on CPAP     "suppose to wear a mask" (03/17/2017)  . Peripheral neuropathy   . Pneumonia 1990s X 1   "think i had walking pneumonia"  . Seasonal allergies   . Stroke ([place]) 03/2017   "light one"; denies residual on 03/17/2017)  . Thyromegaly    mild  . Type II diabetes mellitus ([place])     Past Surgical History:  Procedure Laterality Date  . BI-VENTRICULAR PACEMAKER UPGRADE  11/05/2015   "upgraded my pacemaker"  . BIOPSY THYROID  05/2010   percutaneous  . CARDIAC CATHETERIZATION N/A 10/21/2015   Procedure: Left Heart Cath and Cors/Grafts Angiography;  Surgeon: Belva Crome, MD;  Location: [place] [facility];  Service: Cardiovascular;  Laterality: N/A;  . CARPAL TUNNEL RELEASE Bilateral   . CATARACT EXTRACTION Right 01/06/2015  . CATARACT EXTRACTION W/ INTRAOCULAR LENS IMPLANT Left 12/16/2014  . CORONARY ARTERY BYPASS GRAFT  2000  . CYSTECTOMY Right    "middle finger"  . DENTAL IMPLANTS    . EP IMPLANTABLE DEVICE N/A 11/05/2015   Procedure: BiV Upgrade;  Surgeon: Deboraha Sprang, MD;  Location: [place] [facility];  Service: Cardiovascular;  Laterality: N/A;  . INSERT / REPLACE / REMOVE PACEMAKER    . LEAD REVISION N/A 10/25/2012   Procedure: LEAD REVISION;  Surgeon: Evans Lance, MD;  Location: [facility] CATH LAB;  Service: Cardiovascular;  Laterality: N/A;  . LEFT HEART CATHETERIZATION WITH CORONARY ANGIOGRAM N/A 10/23/2012   Procedure: LEFT HEART CATHETERIZATION WITH CORONARY ANGIOGRAM;  Surgeon: Jacolyn Reedy, MD;  Location: [facility] CATH LAB;  Service: Cardiovascular;  Laterality: N/A;  . PERMANENT PACEMAKER INSERTION N/A 10/23/2012   Procedure: PERMANENT PACEMAKER INSERTION;  Surgeon: Deboraha Sprang, MD;  Location: [facility] CATH LAB;  Service: Cardiovascular;  Laterality: N/A;  . PLANTAR FASCIA RELEASE Left    "& clipped a tendon that went thru bottom of my foot"  . TEMPORARY PACEMAKER INSERTION N/A 10/22/2012   Procedure: TEMPORARY PACEMAKER INSERTION;  Surgeon: Troy Sine, MD;  Location: [facility] CATH LAB;   Service: Cardiovascular;  Laterality: N/A;  . TUBAL LIGATION      Social History   Social History  . Marital status: Married    Spouse name: N/A  . Number of children: 2  . Years of education: N/A   Occupational History  .  Retired   Social History Main Topics  . Smoking status: Never Smoker  . Smokeless tobacco: Never Used  . Alcohol use No  . Drug use: No  . Sexual activity: No   Other Topics Concern  . Not on file   Social History Narrative   Does not work outside the home    Current Outpatient Prescriptions on File Prior to Visit  Medication Sig Dispense Refill  . ACCU-CHEK FASTCLIX LANCETS MISC USE  TO CHECK BLOOD SUGAR FOUR TIMES DAILY 408 each 2  . ACCU-CHEK SMARTVIEW test strip USE  TO CHECK BLOOD SUGAR FOUR TIMES DAILY 400 each 1  . Alcohol Swabs (B-D SINGLE USE SWABS REGULAR) PADS Use 8 daily for testing blood sugar and insulin injections. 800 each 1  . AMBULATORY NON FORMULARY MEDICATION "Omega Q Plus capsules" - Take 1 capsule by mouth once a day    . aspirin EC 325 MG EC tablet Take 1 tablet (325 mg total) by mouth daily. 30 tablet 0  . beta carotene w/minerals (OCUVITE) tablet Take 1 tablet by mouth daily.    . Blood Glucose Calibration (ACCU-CHEK SMARTVIEW CONTROL) LIQD Use to calibrate blood glucose meter. 3 each 1  . cetirizine (ZYRTEC) 10 MG tablet Take 1 tablet (10 mg total) by mouth daily.    . cholecalciferol (VITAMIN D) 1000 UNITS tablet Take 2,000 Units by mouth daily.    . furosemide (LASIX) 40 MG tablet TAKE 2 TABLETS EVERY MORNING AND 1 TABLET EVERY EVENING. TAKE AN ADDITIONAL 1 TABLET FOR WEIGHT GAIN OF 3 LBS OR MORE  360 tablet 2  . GLUCOSAMINE-CHONDROITIN PO Take 1 tablet by mouth daily.    . Insulin Syringe-Needle U-100 (BD VEO INSULIN SYR ULTRAFINE) 31G X 15/64" 1 ML MISC USE 10 TIMES A DAY WITH INSULIN AS DIRECTED 900 each 0  . ketoconazole (NIZORAL) 2 % cream Apply 1 application topically daily. TO AFFECTED AREAS OF FACE    . nitroGLYCERIN  (NITROSTAT) 0.4 MG SL tablet Place 0.4 mg under the tongue as needed for chest pain.     . Omega-3 Fatty Acids (OMEGA-3 PO) Take 1 capsule by mouth daily.    [name] omeprazole (PRILOSEC) 20 MG capsule Take 20 mg by mouth daily as needed (for reflux symptoms).    . potassium chloride SA (KLOR-CON M20) 20 MEQ tablet Take 1 tablet (20 mEq total) by mouth daily. 180 tablet 2  . pravastatin (PRAVACHOL) 20 MG tablet TAKE 1  TABLET BY MOUTH ONCE DAILY 6 IN THE EVENING 90 tablet 6  . Probiotic Product (PROBIOTIC DAILY) CAPS Take 1 capsule by mouth daily.    . psyllium (METAMUCIL) 58.6 % powder Take 1 packet by mouth 3 (three) times daily.    . Sodium Chloride-Sodium Bicarb (NETI POT SINUS WASH NA) As needed/as directed for sinus infections    . vitamin C (ASCORBIC ACID) 500 MG tablet Take 500 mg by mouth every other day.     No current facility-administered medications on file prior to visit.     Allergies  Allergen Reactions  . Statins Other (See Comments)    Statins make the legs ache  . Tricor [Fenofibrate] Other (See Comments)    Leg pain  . Latex Itching and Other (See Comments)    Affected areas turn red, also-  . Tape Rash    Heart leads break out the skin    Family History  Problem Relation Age of Onset  . Diabetes Father   . Heart disease Father   . Diabetes Mother   . Diabetes Other        Siblings and 2 children  . Heart disease Other        CAD  . Colon cancer Neg Hx   . Colon polyps Neg Hx     BP 122/64   Pulse 81   Wt 204 lb (92.5 kg)   SpO2 96%   BMI 36.72 kg/m     Review of Systems She denies hypoglycemia.      Objective:   Physical Exam VITAL SIGNS:  See vs page.  GENERAL: no distress Pulses: dorsalis pedis intact bilat.  Skin: no ulcer on the feet. feet are of normal color and temp. There is a healed vein harvest scar at the right leg.  Feet: no deformity.  CV: trace bilat leg edema.  Neuro: sensation is intact to touch on the feet,but decreased from  normal.    Lab Results  Component Value Date   HGBA1C 7.5 07/11/2017      Assessment & Plan:  Insulin-requiring type 2 DM, with CAD: she needs increased rx  Patient Instructions  check your blood sugar 4 times a day--before the 3 meals, and at bedtime (the bedtime reading is important, as we may need to increase the suppertime insulin).  also check if you have symptoms of your blood sugar being too high or too low.  please keep a record of the readings and bring it to your next appointment here.  please call us sooner if you are having low blood sugar episodes.  Please increase the insulin to 170 at breakfast, 170 at lunch, and 170 at dinner.   Please come back for a follow-up appointment in 3 months.

## 2017-07-12 DIAGNOSIS — H52223 Regular astigmatism, bilateral: Secondary | ICD-10-CM | POA: Diagnosis not present

## 2017-07-12 DIAGNOSIS — E119 Type 2 diabetes mellitus without complications: Secondary | ICD-10-CM | POA: Diagnosis not present

## 2017-07-12 DIAGNOSIS — H26493 Other secondary cataract, bilateral: Secondary | ICD-10-CM | POA: Diagnosis not present

## 2017-07-12 DIAGNOSIS — H5203 Hypermetropia, bilateral: Secondary | ICD-10-CM | POA: Diagnosis not present

## 2017-07-12 DIAGNOSIS — H524 Presbyopia: Secondary | ICD-10-CM | POA: Diagnosis not present

## 2017-07-12 LAB — HM DIABETES EYE EXAM

## 2017-07-22 ENCOUNTER — Other Ambulatory Visit: Payer: Self-pay | Admitting: Internal Medicine

## 2017-07-25 ENCOUNTER — Ambulatory Visit: Payer: Medicare HMO | Admitting: Internal Medicine

## 2017-07-27 ENCOUNTER — Ambulatory Visit
Admission: RE | Admit: 2017-07-27 | Discharge: 2017-07-27 | Disposition: A | Discharge status: Home / Self Care | Payer: Medicare HMO | Source: Ambulatory Visit | Attending: Endocrinology | Admitting: Endocrinology

## 2017-07-27 ENCOUNTER — Other Ambulatory Visit: Payer: Self-pay | Admitting: Internal Medicine

## 2017-07-27 DIAGNOSIS — E042 Nontoxic multinodular goiter: Secondary | ICD-10-CM | POA: Diagnosis not present

## 2017-08-09 ENCOUNTER — Ambulatory Visit (INDEPENDENT_AMBULATORY_CARE_PROVIDER_SITE_OTHER): Payer: Medicare HMO | Admitting: *Deleted

## 2017-08-09 DIAGNOSIS — Z961 Presence of intraocular lens: Secondary | ICD-10-CM | POA: Diagnosis not present

## 2017-08-09 DIAGNOSIS — H26493 Other secondary cataract, bilateral: Secondary | ICD-10-CM | POA: Diagnosis not present

## 2017-08-09 DIAGNOSIS — I442 Atrioventricular block, complete: Secondary | ICD-10-CM | POA: Diagnosis not present

## 2017-08-09 DIAGNOSIS — H02839 Dermatochalasis of unspecified eye, unspecified eyelid: Secondary | ICD-10-CM | POA: Diagnosis not present

## 2017-08-09 DIAGNOSIS — H26492 Other secondary cataract, left eye: Secondary | ICD-10-CM | POA: Diagnosis not present

## 2017-08-10 NOTE — Progress Notes (Signed)
Remote ICD transmission.   

## 2017-08-12 ENCOUNTER — Encounter: Payer: Self-pay | Admitting: Cardiology

## 2017-08-19 DIAGNOSIS — Z961 Presence of intraocular lens: Secondary | ICD-10-CM | POA: Diagnosis not present

## 2017-08-19 DIAGNOSIS — Z9842 Cataract extraction status, left eye: Secondary | ICD-10-CM | POA: Diagnosis not present

## 2017-08-22 LAB — CUP PACEART REMOTE DEVICE CHECK
Battery Remaining Percentage: 76 %
Battery Voltage: 2.98 V
Brady Statistic AS VP Percent: 95 %
Brady Statistic RA Percent Paced: 4.6 %
HIGH POWER IMPEDANCE MEASURED VALUE: 65 Ohm
HighPow Impedance: 65 Ohm
Implantable Lead Implant Date: 20170222
Implantable Lead Implant Date: 20170222
Implantable Lead Location: 753859
Implantable Lead Location: 753860
Implantable Lead Model: 4298
Implantable Lead Model: 7122
Lead Channel Impedance Value: 350 Ohm
Lead Channel Impedance Value: 400 Ohm
Lead Channel Impedance Value: 530 Ohm
Lead Channel Pacing Threshold Amplitude: 0.625 V
Lead Channel Pacing Threshold Amplitude: 0.75 V
Lead Channel Pacing Threshold Amplitude: 0.875 V
Lead Channel Pacing Threshold Pulse Width: 0.5 ms
Lead Channel Pacing Threshold Pulse Width: 0.5 ms
Lead Channel Sensing Intrinsic Amplitude: 12 mV
Lead Channel Setting Pacing Amplitude: 2 V
Lead Channel Setting Pacing Pulse Width: 0.5 ms
Lead Channel Setting Sensing Sensitivity: 0.5 mV
MDC IDC LEAD IMPLANT DT: 20140210
MDC IDC LEAD LOCATION: 753858
MDC IDC MSMT BATTERY REMAINING LONGEVITY: 65 mo
MDC IDC MSMT LEADCHNL RA PACING THRESHOLD PULSEWIDTH: 0.5 ms
MDC IDC MSMT LEADCHNL RA SENSING INTR AMPL: 3.2 mV
MDC IDC PG IMPLANT DT: 20170222
MDC IDC PG SERIAL: 7341405
MDC IDC SESS DTM: 20181127115701
MDC IDC SET LEADCHNL LV PACING AMPLITUDE: 2 V
MDC IDC SET LEADCHNL LV PACING PULSEWIDTH: 0.5 ms
MDC IDC SET LEADCHNL RV PACING AMPLITUDE: 2 V
MDC IDC STAT BRADY AP VP PERCENT: 4.9 %
MDC IDC STAT BRADY AP VS PERCENT: 1 %
MDC IDC STAT BRADY AS VS PERCENT: 1 %

## 2017-08-28 ENCOUNTER — Other Ambulatory Visit: Payer: Self-pay | Admitting: Endocrinology

## 2017-08-28 ENCOUNTER — Other Ambulatory Visit: Payer: Self-pay | Admitting: Internal Medicine

## 2017-10-11 ENCOUNTER — Ambulatory Visit: Payer: Medicare HMO | Admitting: Endocrinology

## 2017-10-21 ENCOUNTER — Other Ambulatory Visit: Payer: Self-pay | Admitting: Internal Medicine

## 2017-10-21 ENCOUNTER — Ambulatory Visit (INDEPENDENT_AMBULATORY_CARE_PROVIDER_SITE_OTHER): Payer: Medicare HMO | Admitting: Endocrinology

## 2017-10-21 ENCOUNTER — Encounter: Payer: Self-pay | Admitting: Endocrinology

## 2017-10-21 VITALS — BP 120/70 | HR 79 | Ht 62.5 in | Wt 207.6 lb

## 2017-10-21 DIAGNOSIS — E042 Nontoxic multinodular goiter: Secondary | ICD-10-CM

## 2017-10-21 DIAGNOSIS — E1140 Type 2 diabetes mellitus with diabetic neuropathy, unspecified: Secondary | ICD-10-CM | POA: Diagnosis not present

## 2017-10-21 DIAGNOSIS — Z794 Long term (current) use of insulin: Secondary | ICD-10-CM | POA: Diagnosis not present

## 2017-10-21 LAB — POCT GLYCOSYLATED HEMOGLOBIN (HGB A1C): Hemoglobin A1C: 8.3

## 2017-10-21 MED ORDER — INSULIN REGULAR HUMAN 100 UNIT/ML IJ SOLN: 180.0000 [IU] | Freq: Three times a day (TID) | INTRAMUSCULAR | 11 refills | Status: DC

## 2017-10-21 MED ORDER — FREESTYLE LIBRE 14 DAY SENSOR MISC: 1.0000 | 3 refills | Status: DC

## 2017-10-21 MED ORDER — FREESTYLE LIBRE 14 DAY READER DEVI: 1.0000 | Freq: Once | 0 refills | Status: DC

## 2017-10-21 NOTE — Progress Notes (Signed)
Subjective:    Patient ID: Laurie [name], female    DOB: [birth year], 72 y.o.   MRN: [id number]  HPI Pt returns for f/u of diabetes mellitus:   DM type: insulin-requiring type 2 Dx'ed: 7829 Complications: peripheral sensory neuropathy, CVA, and CAD.   Therapy: insulin since 2011.   GDM: 1985.   DKA: never Severe hypoglycemia: never.   Pancreatitis: never.   Other: she has severe insulin resistance; she required very little insulin during a hospitalization in early 2014; she wants to delay weight-loss surgery for now; she takes human insulin, due to cost; she has long duration of action of insulin, and based on pattern of cbg's, does not need basal insulin.    Interval history:  pt states she feels well in general.  no cbg record, but states cbg's vary from 170-200.  she seldom has hypoglycemia, and these are mild.  These happen fasting.   Pt also has multinodular goiter (dx'ed 2011; bx then showed HYPERPLASTIC LESION; she has been euthyroid; f/u US in late 2018 was unchanged).   Past Medical History:  Diagnosis Date  . AICD (automatic cardioverter/defibrillator) present   . Arthritis    "hands; I think I've got some in my back" (03/17/2017)  . Bell's palsy 01/05/2010  . CAD (coronary artery disease)    Cath  07/21/1999  mild ostial, 80% stenosis proximal LAD, 90% stenosis proximal Diag 1, 95% stenosis proximal OM 1, 80% stenosis proximal RCA, 95% stenosis mid RCA    CABG 07/22/99  LIMA to LAD, SVG to dx, SVG to OM, SVG to PDA  Dr. Carmin Muskrat 2012 no ischemia EF 67%   . CHF (congestive heart failure) ([place]) dx'd 10/17/2015  . Chronic lower back pain   . Cystitis   . Heart murmur   . Hepatitis 1970s   "don't know which" (03/17/2017)  . HIstory of Bell's palsy    October 2012   . Hyperlipidemia    Intolerance to several statins   . Hypertension   . Hypertensive heart disease without CHF   . Multinodular goiter   . Obesity (BMI 30-39.9)   . OSA on CPAP    "suppose to wear a  mask" (03/17/2017)  . Peripheral neuropathy   . Pneumonia 1990s X 1   "think i had walking pneumonia"  . Seasonal allergies   . Stroke ([place]) 03/2017   "light one"; denies residual on 03/17/2017)  . Thyromegaly    mild  . Type II diabetes mellitus ([place])     Past Surgical History:  Procedure Laterality Date  . BI-VENTRICULAR PACEMAKER UPGRADE  11/05/2015   "upgraded my pacemaker"  . BIOPSY THYROID  05/2010   percutaneous  . CARDIAC CATHETERIZATION N/A 10/21/2015   Procedure: Left Heart Cath and Cors/Grafts Angiography;  Surgeon: Belva Crome, MD;  Location: [place] [facility];  Service: Cardiovascular;  Laterality: N/A;  . CARPAL TUNNEL RELEASE Bilateral   . CATARACT EXTRACTION Right 01/06/2015  . CATARACT EXTRACTION W/ INTRAOCULAR LENS IMPLANT Left 12/16/2014  . CORONARY ARTERY BYPASS GRAFT  2000  . CYSTECTOMY Right    "middle finger"  . DENTAL IMPLANTS    . EP IMPLANTABLE DEVICE N/A 11/05/2015   Procedure: BiV Upgrade;  Surgeon: Deboraha Sprang, MD;  Location: [place] [facility];  Service: Cardiovascular;  Laterality: N/A;  . INSERT / REPLACE / REMOVE PACEMAKER    . LEAD REVISION N/A 10/25/2012   Procedure: LEAD REVISION;  Surgeon: Evans Lance, MD;  Location: [place] CATH LAB;  Service: Cardiovascular;  Laterality: N/A;  . LEFT HEART CATHETERIZATION WITH CORONARY ANGIOGRAM N/A 10/23/2012   Procedure: LEFT HEART CATHETERIZATION WITH CORONARY ANGIOGRAM;  Surgeon: Jacolyn Reedy, MD;  Location: [facility] CATH LAB;  Service: Cardiovascular;  Laterality: N/A;  . PERMANENT PACEMAKER INSERTION N/A 10/23/2012   Procedure: PERMANENT PACEMAKER INSERTION;  Surgeon: Deboraha Sprang, MD;  Location: [facility] CATH LAB;  Service: Cardiovascular;  Laterality: N/A;  . PLANTAR FASCIA RELEASE Left    "& clipped a tendon that went thru bottom of my foot"  . TEMPORARY PACEMAKER INSERTION N/A 10/22/2012   Procedure: TEMPORARY PACEMAKER INSERTION;  Surgeon: Troy Sine, MD;  Location: [facility] CATH LAB;  Service: Cardiovascular;   Laterality: N/A;  . TUBAL LIGATION      Social History   Socioeconomic History  . Marital status: Married    Spouse name: Not on file  . Number of children: 2  . Years of education: Not on file  . Highest education level: Not on file  Social Needs  . Financial resource strain: Not on file  . Food insecurity - worry: Not on file  . Food insecurity - inability: Not on file  . Transportation needs - medical: Not on file  . Transportation needs - non-medical: Not on file  Occupational History    Employer: RETIRED  Tobacco Use  . Smoking status: Never Smoker  . Smokeless tobacco: Never Used  Substance and Sexual Activity  . Alcohol use: No    Alcohol/week: 0.0 oz  . Drug use: No  . Sexual activity: No  Other Topics Concern  . Not on file  Social History Narrative   Does not work outside the home    Current Outpatient Medications on File Prior to Visit  Medication Sig Dispense Refill  . ACCU-CHEK FASTCLIX LANCETS MISC USE  TO CHECK BLOOD SUGAR FOUR TIMES DAILY 408 each 2  . ACCU-CHEK SMARTVIEW test strip USE  TO CHECK BLOOD SUGAR FOUR TIMES DAILY 400 each 1  . Alcohol Swabs (B-D SINGLE USE SWABS REGULAR) PADS Use 8 daily for testing blood sugar and insulin injections. 800 each 1  . AMBULATORY NON FORMULARY MEDICATION "Omega Q Plus capsules" - Take 1 capsule by mouth once a day    . aspirin EC 325 MG EC tablet Take 1 tablet (325 mg total) by mouth daily. 30 tablet 0  . beta carotene w/minerals (OCUVITE) tablet Take 1 tablet by mouth daily.    . Blood Glucose Calibration (ACCU-CHEK SMARTVIEW CONTROL) LIQD Use to calibrate blood glucose meter. 3 each 1  . cetirizine (ZYRTEC) 10 MG tablet Take 1 tablet (10 mg total) by mouth daily.    . cholecalciferol (VITAMIN D) 1000 UNITS tablet Take 1,000 Units by mouth daily.     . furosemide (LASIX) 40 MG tablet TAKE 2 TABLETS EVERY MORNING AND 1 TABLET EVERY EVENING. TAKE AN ADDITIONAL 1 TABLET FOR WEIGHT GAIN OF 3 LBS OR MORE  360 tablet 2    . GLUCOSAMINE-CHONDROITIN PO Take 1 tablet by mouth daily.    . Insulin Syringe-Needle U-100 (BD VEO INSULIN SYR ULTRAFINE) 31G X 15/64" 1 ML MISC USE 10 TIMES A DAY WITH INSULIN AS DIRECTED 900 each 0  . ketoconazole (NIZORAL) 2 % cream Apply 1 application topically daily. TO AFFECTED AREAS OF FACE    . KLOR-CON M20 20 MEQ tablet TAKE ONE TABLET BY MOUTH TWICE DAILY. 180 tablet 1  . nitroGLYCERIN (NITROSTAT) 0.4 MG SL tablet Place 0.4  mg under the tongue as needed for chest pain.     . Omega-3 Fatty Acids (OMEGA-3 PO) Take 1 capsule by mouth daily.    [name] omeprazole (PRILOSEC) 20 MG capsule Take 20 mg by mouth daily as needed (for reflux symptoms).    . potassium chloride SA (KLOR-CON M20) 20 MEQ tablet Take 1 tablet (20 mEq total) by mouth daily. 180 tablet 2  . pravastatin (PRAVACHOL) 20 MG tablet TAKE 1 TABLET BY MOUTH ONCE DAILY 6 IN THE EVENING 90 tablet 6  . pravastatin (PRAVACHOL) 20 MG tablet TAKE 1 TABLET BY MOUTH ONCE DAILY AT 6PM IN THE EVENING 30 tablet 0  . Probiotic Product (PROBIOTIC DAILY) CAPS Take 1 capsule by mouth daily.    . psyllium (METAMUCIL) 58.6 % powder Take 1 packet by mouth 3 (three) times daily.    . Sodium Chloride-Sodium Bicarb (NETI POT SINUS WASH NA) As needed/as directed for sinus infections    . vitamin C (ASCORBIC ACID) 500 MG tablet Take 500 mg by mouth every other day.     No current facility-administered medications on file prior to visit.     Allergies  Allergen Reactions  . Statins Other (See Comments)    Statins make the legs ache  . Tricor [Fenofibrate] Other (See Comments)    Leg pain  . Latex Itching and Other (See Comments)    Affected areas turn red, also-  . Tape Rash    Heart leads break out the skin    Family History  Problem Relation Age of Onset  . Diabetes Father   . Heart disease Father   . Diabetes Mother   . Diabetes Other        Siblings and 2 children  . Heart disease Other        CAD  . Colon cancer Neg Hx   . Colon  polyps Neg Hx     BP 120/70 (BP Location: Left Arm, Patient Position: Sitting, Cuff Size: Normal)   Pulse 79   Ht 5' 2.5" (1.588 m)   Wt 207 lb 9.6 oz (94.2 kg)   SpO2 96%   BMI 37.37 kg/m    Review of Systems Denies LOC.  Leg numbness persists    Objective:   Physical Exam VITAL SIGNS:  See vs page.  GENERAL: no distress Pulses: dorsalis pedis intact bilat.  Skin: no ulcer on the feet. feet are of normal color and temp. There is a healed vein harvest scar at the right leg.  Feet: no deformity.  CV: trace bilat leg edema.  Neuro: sensation is intact to touch on the feet,but decreased from normal.   Lab Results  Component Value Date   HGBA1C 8.3 10/21/2017      Assessment & Plan:  Insulin-requiring type 2 DM, with CAD: worse Multinodular goiter: due for recheck of TSH Polyneuropathy: persistent  Patient Instructions  check your blood sugar 4 times a day--before the 3 meals, and at bedtime (the bedtime reading is important, as we may need to increase the suppertime insulin).  also check if you have symptoms of your blood sugar being too high or too low.  please keep a record of the readings and bring it to your next appointment here.  please call us sooner if you are having low blood sugar episodes.  Please increase the insulin to 170 at breakfast, 170 at lunch, and 170 at dinner.  Some specialists say that taking a high amount of folic acid (4-5 mg per  day) helps the neuropathy.  blood tests are requested for you today.  We'll let you know about the results.   Eat a light snack at bedtime, to avoid the sugar going low in the morning. Please come back for a follow-up appointment in 2 months.

## 2017-10-21 NOTE — Patient Instructions (Addendum)
check your blood sugar 4 times a day--before the 3 meals, and at bedtime (the bedtime reading is important, as we may need to increase the suppertime insulin).  also check if you have symptoms of your blood sugar being too high or too low.  please keep a record of the readings and bring it to your next appointment here.  please call us sooner if you are having low blood sugar episodes.  Please increase the insulin to 170 at breakfast, 170 at lunch, and 170 at dinner.  Some specialists say that taking a high amount of folic acid (4-5 mg per day) helps the neuropathy.  blood tests are requested for you today.  We'll let you know about the results.   Eat a light snack at bedtime, to avoid the sugar going low in the morning. Please come back for a follow-up appointment in 2 months.

## 2017-10-26 ENCOUNTER — Telehealth: Payer: Self-pay | Admitting: Endocrinology

## 2017-10-27 ENCOUNTER — Inpatient Hospital Stay: Admission: AD | Admit: 2017-10-27 | Payer: Medicare HMO | Source: Ambulatory Visit | Admitting: Cardiology

## 2017-10-27 ENCOUNTER — Encounter: Payer: Self-pay | Admitting: Cardiology

## 2017-10-27 DIAGNOSIS — E668 Other obesity: Secondary | ICD-10-CM | POA: Diagnosis not present

## 2017-10-27 DIAGNOSIS — I119 Hypertensive heart disease without heart failure: Secondary | ICD-10-CM | POA: Diagnosis not present

## 2017-10-27 DIAGNOSIS — G4733 Obstructive sleep apnea (adult) (pediatric): Secondary | ICD-10-CM | POA: Diagnosis not present

## 2017-10-27 DIAGNOSIS — Z955 Presence of coronary angioplasty implant and graft: Secondary | ICD-10-CM | POA: Diagnosis not present

## 2017-10-27 DIAGNOSIS — I200 Unstable angina: Secondary | ICD-10-CM | POA: Diagnosis present

## 2017-10-27 DIAGNOSIS — E7849 Other hyperlipidemia: Secondary | ICD-10-CM | POA: Diagnosis not present

## 2017-10-27 DIAGNOSIS — I2510 Atherosclerotic heart disease of native coronary artery without angina pectoris: Secondary | ICD-10-CM | POA: Diagnosis not present

## 2017-10-27 DIAGNOSIS — Z95810 Presence of automatic (implantable) cardiac defibrillator: Secondary | ICD-10-CM | POA: Diagnosis not present

## 2017-10-27 DIAGNOSIS — E1121 Type 2 diabetes mellitus with diabetic nephropathy: Secondary | ICD-10-CM | POA: Diagnosis not present

## 2017-10-27 DIAGNOSIS — Z951 Presence of aortocoronary bypass graft: Secondary | ICD-10-CM | POA: Diagnosis not present

## 2017-10-27 HISTORY — DX: Chronic systolic (congestive) heart failure: I50.22

## 2017-10-27 NOTE — H&P (Signed)
History and Physical   Admit date: 10/27/2017 Name:  Laurie [name] Medical record number: [id number] DOB/Age:  11/15/1945  72 y.o. female  Primary Cardiologist: Wynonia Lawman Primary Physician:   Cordelia Pen  Chief complaint/reason for admission: Unstable angina  HPI:  This 71 year old female has a history of long-standing diabetes and had bypass grafting in 2000.  Her grafts have been patent over the years but in 2017 she presented with an episode of syncope and was found to have intermittent complete heart block.  She developed progressive LV dysfunction and her EF went to 30-35% with evolution of 100% ventricular pacing and she developed heart failure.  In February 2017 she underwent CRT upgrade.  Follow-up echo was 40-45% following this.  She was hospitalized with a stroke this past summer that she made a fairly good recovery from and there was a question about hypoglycemia.  She has had marked insulin resistance in the past and is on 180 mg 3 times daily.  She had a myocardial perfusion scan done this year with findings of a large inferolateral infarction with no ischemia.  She has not really had much in the way of angina since then and her heart failure has been fairly well controlled.  About 2 weeks ago she began to have substernal aching pain when she would breathe cold air around and walk.  This was similar to her symptoms prior to bypass.  The symptoms have been worse and are not occur when she has warm air breathes in.  On walking into the office today she was having midsternal aching was noted to have 4 mm of ST depression in the high lateral leads during an episode of pain in the office.  Because of her progressive symptoms she is admitted to the hospital for intravenous heparin and for further evaluation.  She has occasional edema.  She has complained of aching involving her right leg at times.  She has no PND, orthopnea or recent TIAs.  Past Medical History:  Diagnosis Date  . AICD (automatic  cardioverter/defibrillator) present   . Bell's palsy 01/05/2010  . CAD (coronary artery disease)    Cath  07/21/1999  mild ostial, 80% stenosis proximal LAD, 90% stenosis proximal Diag 1, 95% stenosis proximal OM 1, 80% stenosis proximal RCA, 95% stenosis mid RCA    CABG 07/22/99  LIMA to LAD, SVG to dx, SVG to OM, SVG to PDA  Dr. [name]   Cardiolite 2012 no ischemia EF 67%  Cath 10/23/12 Severe native three-vessel coronary artery disease with occlusion of all 3 native coronary arteries, Patent sap  . Chronic lower back pain   . Chronic systolic CHF (congestive heart failure) ([place]) 10/22/2015  . Cystitis   . Hepatitis 1970s   "don't know which" (03/17/2017)  . HIstory of Bell's palsy    October 2012   . Hyperlipidemia    Intolerance to several statins   . Hypertensive heart disease without CHF   . Multinodular goiter   . Obesity (BMI 30-39.9)   . OSA on CPAP    "suppose to wear a mask" (03/17/2017)  . Peripheral neuropathy   . Pneumonia 1990s X 1   "think i had walking pneumonia"  . Seasonal allergies   . Stroke ([place]) 03/2017   "light one"; denies residual on 03/17/2017)  . Type II diabetes mellitus ([place])      Past Surgical History:  Procedure Laterality Date  . BI-VENTRICULAR PACEMAKER UPGRADE  11/05/2015   "upgraded my pacemaker"  . BIOPSY THYROID  05/2010   percutaneous  . CARDIAC CATHETERIZATION N/A 10/21/2015   Procedure: Left Heart Cath and Cors/Grafts Angiography;  Surgeon: Belva Crome, MD;  Location: [place] [facility];  Service: Cardiovascular;  Laterality: N/A;  . CARPAL TUNNEL RELEASE Bilateral   . CATARACT EXTRACTION Right 01/06/2015  . CATARACT EXTRACTION W/ INTRAOCULAR LENS IMPLANT Left 12/16/2014  . CORONARY ARTERY BYPASS GRAFT  2000  . CYSTECTOMY Right    "middle finger"  . DENTAL IMPLANTS    . EP IMPLANTABLE DEVICE N/A 11/05/2015   Procedure: BiV Upgrade;  Surgeon: Deboraha Sprang, MD;  Location: [place] [facility];  Service: Cardiovascular;  Laterality: N/A;  . INSERT /  REPLACE / REMOVE PACEMAKER    . LEAD REVISION N/A 10/25/2012   Procedure: LEAD REVISION;  Surgeon: Evans Lance, MD;  Location: [facility] CATH LAB;  Service: Cardiovascular;  Laterality: N/A;  . LEFT HEART CATHETERIZATION WITH CORONARY ANGIOGRAM N/A 10/23/2012   Procedure: LEFT HEART CATHETERIZATION WITH CORONARY ANGIOGRAM;  Surgeon: Jacolyn Reedy, MD;  Location: [facility] CATH LAB;  Service: Cardiovascular;  Laterality: N/A;  . PERMANENT PACEMAKER INSERTION N/A 10/23/2012   Procedure: PERMANENT PACEMAKER INSERTION;  Surgeon: Deboraha Sprang, MD;  Location: [facility] CATH LAB;  Service: Cardiovascular;  Laterality: N/A;  . PLANTAR FASCIA RELEASE Left    "& clipped a tendon that went thru bottom of my foot"  . TEMPORARY PACEMAKER INSERTION N/A 10/22/2012   Procedure: TEMPORARY PACEMAKER INSERTION;  Surgeon: Troy Sine, MD;  Location: [facility] CATH LAB;  Service: Cardiovascular;  Laterality: N/A;  . TUBAL LIGATION     Allergies: is allergic to statins; tricor [fenofibrate]; latex; and tape.   Medications: Prior to Admission medications   Medication Sig Start Date End Date Taking? Authorizing Provider  ACCU-CHEK FASTCLIX LANCETS MISC USE  TO CHECK BLOOD SUGAR FOUR TIMES DAILY 07/20/16   Renato Shin, MD  ACCU-CHEK SMARTVIEW test strip USE  TO CHECK BLOOD SUGAR FOUR TIMES DAILY 08/29/17   Renato Shin, MD  Alcohol Swabs (B-D SINGLE USE SWABS REGULAR) PADS Use 8 daily for testing blood sugar and insulin injections. 09/12/14   Renato Shin, MD  aspirin EC 325 MG EC tablet Take 1 tablet (325 mg total) by mouth daily. 03/21/17   Velvet Bathe, MD  beta carotene w/minerals (OCUVITE) tablet Take 1 tablet by mouth daily.    [provider]  Blood Glucose Calibration (ACCU-CHEK SMARTVIEW CONTROL) LIQD Use to calibrate blood glucose meter. 09/12/14   Renato Shin, MD  cetirizine (ZYRTEC) 10 MG tablet Take 1 tablet (10 mg total) by mouth daily. 03/20/17   Velvet Bathe, MD  cholecalciferol (VITAMIN D) 1000 UNITS tablet  Take 1,000 Units by mouth daily.     [provider]  Continuous Blood Gluc Sensor (FREESTYLE LIBRE 14 DAY SENSOR) MISC 1 Device by Does not apply route every 14 (fourteen) days. 10/21/17   Renato Shin, MD  furosemide (LASIX) 40 MG tablet TAKE 2 TABLETS EVERY MORNING AND 1 TABLET EVERY EVENING. TAKE AN ADDITIONAL 1 TABLET FOR WEIGHT GAIN OF 3 LBS OR MORE  04/26/17   Deboraha Sprang, MD  GLUCOSAMINE-CHONDROITIN PO Take 1 tablet by mouth daily.    [provider]  insulin regular (HUMULIN R) 100 units/mL injection Inject 1.8 mLs (180 Units total) into the skin 3 (three) times daily before meals. And syringes 6/day. 10/21/17   Renato Shin, MD  Insulin Syringe-Needle U-100 (BD VEO INSULIN SYR ULTRAFINE) 31G X 15/64" 1 ML MISC USE 10  TIMES A DAY WITH INSULIN AS DIRECTED 06/20/17   Renato Shin, MD  ketoconazole (NIZORAL) 2 % cream Apply 1 application topically daily as needed for irritation. TO AFFECTED AREAS OF FACE     [provider]  Multiple Vitamins-Minerals (MULTIVITAMIN WITH MINERALS) tablet Take 1 tablet by mouth daily.    [provider]  nitroGLYCERIN (NITROSTAT) 0.4 MG SL tablet Place 0.4 mg under the tongue as needed for chest pain.  06/22/16   [provider]  potassium chloride SA (KLOR-CON M20) 20 MEQ tablet Take 1 tablet (20 mEq total) by mouth daily. 10/22/15   Jacolyn Reedy, MD  pravastatin (PRAVACHOL) 20 MG tablet TAKE 1 TABLET BY MOUTH ONCE DAILY 6 IN THE EVENING 06/23/17   Marletta Lor, MD  Probiotic Product (PROBIOTIC DAILY) CAPS Take 1 capsule by mouth daily.    [provider]  psyllium (METAMUCIL) 58.6 % powder Take 1 packet by mouth daily as needed (fiber).     [provider]  vitamin C (ASCORBIC ACID) 500 MG tablet Take 500 mg by mouth every other day.    [provider]   Family History:  Family Status  Relation Name Status  . Father  Deceased at age 54  . Mother  Deceased at age 65        complications of diabetes, coronary arterty disease  . Other  (Not Specified)  . Neg Hx  (Not Specified)   Social History:   reports that  has never smoked. she has never used smokeless tobacco. She reports that she does not drink alcohol or use drugs.   Social History   Social History Narrative   Does not work outside the home     Review of Systems: She has significant neuropathy of her lower extremities.  Has been obese for years not been able to lose weight.  Complains of chronic pain involving her left lower extremity.  Dizziness as well as unsteadiness on her feet.  Very inactive and gets very little in the way of exercise. Other than as noted above, the remainder of the review of systems is normal  Physical Exam: Vital signs: Blood pressure 130/64, pulse 88 weight 206 height 62.5 inches BMI 37.07 General appearance: Severely obese pleasant white female currently in no acute distress Head: Normocephalic, without obvious abnormality, atraumatic Eyes: conjunctivae/corneas clear. PERRL, EOM's intact. Fundi not examined  neck: no adenopathy, no carotid bruit, no JVD and supple, symmetrical, trachea midline Lungs: clear to auscultation bilaterally healed median sternotomy scar, healed ICD incision in the left pectoral region Heart: regular rate and rhythm, S1, S2 normal, no murmur, click, rub or gallop Abdomen: soft, non-tender; bowel sounds normal; no masses,  no organomegaly Pelvic: deferred Extremities: Well-healed saphenous vein harvesting site in the right leg, trace edema, full range of motion Pulses: 2+ and symmetric Skin: Skin color, texture, turgor normal. No rashes or lesions Neurologic: Grossly normal  Labs: Pending at the time of dictation  EKG: From office during episode of chest aching shows 4 mm of ST depression in the high lateral leads, left axis deviation and right bundle branch block Independently reviewed by me  Radiology: Pending at the time of dictation    IMPRESSIONS: 1.  Chest tightness suggestive of unstable angina 2.  CAD with previous bypass graft disease 3.  Insulin-dependent diabetes mellitus with insulin resistance and complications of peripheral neuropathy and vascular disease 4.  Prior history of stroke 5.  Hypertensive heart disease 6.  Obesity with  failure lose weight 7.  Hyperlipidemia  PLAN: She is admitted to the hospital with chest pain at rest.  She will be placed on intravenous heparin.  Check serial cardiac enzymes.Cardiac catheterization was discussed with the patient fully including risks of myocardial infarction, death, stroke, bleeding, arrhythmia, dye allergy, renal insufficiency or bleeding.  The patient understands and is willing to proceed.  Possibility of intervention at the same time also discussed with patient and they understand and are agreeable to proceed.  Procedure will be done by colleagues with [facility].   Signed: Kerry Hough MD [facility] Cardiology  10/27/2017, 5:00 PM

## 2017-10-27 NOTE — H&P (View-Only) (Signed)
History and Physical   Admit date: 10/27/2017 Name:  Laurie [name] Medical record number: [id number] DOB/Age:  1946/05/07  72 y.o. female  Primary Cardiologist: Wynonia Lawman Primary Physician:   Cordelia Pen  Chief complaint/reason for admission: Unstable angina  HPI:  This 72 year old female has a history of long-standing diabetes and had bypass grafting in 2000.  Her grafts have been patent over the years but in 2017 she presented with an episode of syncope and was found to have intermittent complete heart block.  She developed progressive LV dysfunction and her EF went to 30-35% with evolution of 100% ventricular pacing and she developed heart failure.  In February 2017 she underwent CRT upgrade.  Follow-up echo was 40-45% following this.  She was hospitalized with a stroke this past summer that she made a fairly good recovery from and there was a question about hypoglycemia.  She has had marked insulin resistance in the past and is on 180 mg 3 times daily.  She had a myocardial perfusion scan done this year with findings of a large inferolateral infarction with no ischemia.  She has not really had much in the way of angina since then and her heart failure has been fairly well controlled.  About 2 weeks ago she began to have substernal aching pain when she would breathe cold air around and walk.  This was similar to her symptoms prior to bypass.  The symptoms have been worse and are not occur when she has warm air breathes in.  On walking into the office today she was having midsternal aching was noted to have 4 mm of ST depression in the high lateral leads during an episode of pain in the office.  Because of her progressive symptoms she is admitted to the hospital for intravenous heparin and for further evaluation.  She has occasional edema.  She has complained of aching involving her right leg at times.  She has no PND, orthopnea or recent TIAs.  Past Medical History:  Diagnosis Date  . AICD (automatic  cardioverter/defibrillator) present   . Bell's palsy 01/05/2010  . CAD (coronary artery disease)    Cath  07/21/1999  mild ostial, 80% stenosis proximal LAD, 90% stenosis proximal Diag 1, 95% stenosis proximal OM 1, 80% stenosis proximal RCA, 95% stenosis mid RCA    CABG 07/22/99  LIMA to LAD, SVG to dx, SVG to OM, SVG to PDA  Dr. [name]   Cardiolite 2012 no ischemia EF 67%  Cath 10/23/12 Severe native three-vessel coronary artery disease with occlusion of all 3 native coronary arteries, Patent sap  . Chronic lower back pain   . Chronic systolic CHF (congestive heart failure) ([place]) 10/22/2015  . Cystitis   . Hepatitis 1970s   "don't know which" (03/17/2017)  . HIstory of Bell's palsy    October 2012   . Hyperlipidemia    Intolerance to several statins   . Hypertensive heart disease without CHF   . Multinodular goiter   . Obesity (BMI 30-39.9)   . OSA on CPAP    "suppose to wear a mask" (03/17/2017)  . Peripheral neuropathy   . Pneumonia 1990s X 1   "think i had walking pneumonia"  . Seasonal allergies   . Stroke ([place]) 03/2017   "light one"; denies residual on 03/17/2017)  . Type II diabetes mellitus ([place])      Past Surgical History:  Procedure Laterality Date  . BI-VENTRICULAR PACEMAKER UPGRADE  11/05/2015   "upgraded my pacemaker"  . BIOPSY THYROID  05/2010   percutaneous  . CARDIAC CATHETERIZATION N/A 10/21/2015   Procedure: Left Heart Cath and Cors/Grafts Angiography;  Surgeon: Belva Crome, MD;  Location: [place] [facility] LAB;  Service: Cardiovascular;  Laterality: N/A;  . CARPAL TUNNEL RELEASE Bilateral   . CATARACT EXTRACTION Right 01/06/2015  . CATARACT EXTRACTION W/ INTRAOCULAR LENS IMPLANT Left 12/16/2014  . CORONARY ARTERY BYPASS GRAFT  2000  . CYSTECTOMY Right    "middle finger"  . DENTAL IMPLANTS    . EP IMPLANTABLE DEVICE N/A 11/05/2015   Procedure: BiV Upgrade;  Surgeon: Deboraha Sprang, MD;  Location: [place] [facility] LAB;  Service: Cardiovascular;  Laterality: N/A;  . INSERT /  REPLACE / REMOVE PACEMAKER    . LEAD REVISION N/A 10/25/2012   Procedure: LEAD REVISION;  Surgeon: Evans Lance, MD;  Location: [facility] CATH LAB;  Service: Cardiovascular;  Laterality: N/A;  . LEFT HEART CATHETERIZATION WITH CORONARY ANGIOGRAM N/A 10/23/2012   Procedure: LEFT HEART CATHETERIZATION WITH CORONARY ANGIOGRAM;  Surgeon: Jacolyn Reedy, MD;  Location: [facility] CATH LAB;  Service: Cardiovascular;  Laterality: N/A;  . PERMANENT PACEMAKER INSERTION N/A 10/23/2012   Procedure: PERMANENT PACEMAKER INSERTION;  Surgeon: Deboraha Sprang, MD;  Location: [facility] CATH LAB;  Service: Cardiovascular;  Laterality: N/A;  . PLANTAR FASCIA RELEASE Left    "& clipped a tendon that went thru bottom of my foot"  . TEMPORARY PACEMAKER INSERTION N/A 10/22/2012   Procedure: TEMPORARY PACEMAKER INSERTION;  Surgeon: Troy Sine, MD;  Location: [facility] CATH LAB;  Service: Cardiovascular;  Laterality: N/A;  . TUBAL LIGATION     Allergies: is allergic to statins; tricor [fenofibrate]; latex; and tape.   Medications: Prior to Admission medications   Medication Sig Start Date End Date Taking? Authorizing Provider  ACCU-CHEK FASTCLIX LANCETS MISC USE  TO CHECK BLOOD SUGAR FOUR TIMES DAILY 07/20/16   Renato Shin, MD  ACCU-CHEK SMARTVIEW test strip USE  TO CHECK BLOOD SUGAR FOUR TIMES DAILY 08/29/17   Renato Shin, MD  Alcohol Swabs (B-D SINGLE USE SWABS REGULAR) PADS Use 8 daily for testing blood sugar and insulin injections. 09/12/14   Renato Shin, MD  aspirin EC 325 MG EC tablet Take 1 tablet (325 mg total) by mouth daily. 03/21/17   Velvet Bathe, MD  beta carotene w/minerals (OCUVITE) tablet Take 1 tablet by mouth daily.    [provider]  Blood Glucose Calibration (ACCU-CHEK SMARTVIEW CONTROL) LIQD Use to calibrate blood glucose meter. 09/12/14   Renato Shin, MD  cetirizine (ZYRTEC) 10 MG tablet Take 1 tablet (10 mg total) by mouth daily. 03/20/17   Velvet Bathe, MD  cholecalciferol (VITAMIN D) 1000 UNITS tablet  Take 1,000 Units by mouth daily.     [provider]  Continuous Blood Gluc Sensor (FREESTYLE LIBRE 14 DAY SENSOR) MISC 1 Device by Does not apply route every 14 (fourteen) days. 10/21/17   Renato Shin, MD  furosemide (LASIX) 40 MG tablet TAKE 2 TABLETS EVERY MORNING AND 1 TABLET EVERY EVENING. TAKE AN ADDITIONAL 1 TABLET FOR WEIGHT GAIN OF 3 LBS OR MORE  04/26/17   Deboraha Sprang, MD  GLUCOSAMINE-CHONDROITIN PO Take 1 tablet by mouth daily.    [provider]  insulin regular (HUMULIN R) 100 units/mL injection Inject 1.8 mLs (180 Units total) into the skin 3 (three) times daily before meals. And syringes 6/day. 10/21/17   Renato Shin, MD  Insulin Syringe-Needle U-100 (BD VEO INSULIN SYR ULTRAFINE) 31G X 15/64" 1 ML MISC USE 10  TIMES A DAY WITH INSULIN AS DIRECTED 06/20/17   Renato Shin, MD  ketoconazole (NIZORAL) 2 % cream Apply 1 application topically daily as needed for irritation. TO AFFECTED AREAS OF FACE     [provider]  Multiple Vitamins-Minerals (MULTIVITAMIN WITH MINERALS) tablet Take 1 tablet by mouth daily.    [provider]  nitroGLYCERIN (NITROSTAT) 0.4 MG SL tablet Place 0.4 mg under the tongue as needed for chest pain.  06/22/16   [provider]  potassium chloride SA (KLOR-CON M20) 20 MEQ tablet Take 1 tablet (20 mEq total) by mouth daily. 10/22/15   Jacolyn Reedy, MD  pravastatin (PRAVACHOL) 20 MG tablet TAKE 1 TABLET BY MOUTH ONCE DAILY 6 IN THE EVENING 06/23/17   Marletta Lor, MD  Probiotic Product (PROBIOTIC DAILY) CAPS Take 1 capsule by mouth daily.    [provider]  psyllium (METAMUCIL) 58.6 % powder Take 1 packet by mouth daily as needed (fiber).     [provider]  vitamin C (ASCORBIC ACID) 500 MG tablet Take 500 mg by mouth every other day.    [provider]   Family History:  Family Status  Relation Name Status  . Father  Deceased at age 56  . Mother  Deceased at age 77        complications of diabetes, coronary arterty disease  . Other  (Not Specified)  . Neg Hx  (Not Specified)   Social History:   reports that  has never smoked. she has never used smokeless tobacco. She reports that she does not drink alcohol or use drugs.   Social History   Social History Narrative   Does not work outside the home     Review of Systems: She has significant neuropathy of her lower extremities.  Has been obese for years not been able to lose weight.  Complains of chronic pain involving her left lower extremity.  Dizziness as well as unsteadiness on her feet.  Very inactive and gets very little in the way of exercise. Other than as noted above, the remainder of the review of systems is normal  Physical Exam: Vital signs: Blood pressure 130/64, pulse 88 weight 206 height 62.5 inches BMI 37.07 General appearance: Severely obese pleasant white female currently in no acute distress Head: Normocephalic, without obvious abnormality, atraumatic Eyes: conjunctivae/corneas clear. PERRL, EOM's intact. Fundi not examined  neck: no adenopathy, no carotid bruit, no JVD and supple, symmetrical, trachea midline Lungs: clear to auscultation bilaterally healed median sternotomy scar, healed ICD incision in the left pectoral region Heart: regular rate and rhythm, S1, S2 normal, no murmur, click, rub or gallop Abdomen: soft, non-tender; bowel sounds normal; no masses,  no organomegaly Pelvic: deferred Extremities: Well-healed saphenous vein harvesting site in the right leg, trace edema, full range of motion Pulses: 2+ and symmetric Skin: Skin color, texture, turgor normal. No rashes or lesions Neurologic: Grossly normal  Labs: Pending at the time of dictation  EKG: From office during episode of chest aching shows 4 mm of ST depression in the high lateral leads, left axis deviation and right bundle branch block Independently reviewed by me  Radiology: Pending at the time of dictation    IMPRESSIONS: 1.  Chest tightness suggestive of unstable angina 2.  CAD with previous bypass graft disease 3.  Insulin-dependent diabetes mellitus with insulin resistance and complications of peripheral neuropathy and vascular disease 4.  Prior history of stroke 5.  Hypertensive heart disease 6.  Obesity with  failure lose weight 7.  Hyperlipidemia  PLAN: She is admitted to the hospital with chest pain at rest.  She will be placed on intravenous heparin.  Check serial cardiac enzymes.Cardiac catheterization was discussed with the patient fully including risks of myocardial infarction, death, stroke, bleeding, arrhythmia, dye allergy, renal insufficiency or bleeding.  The patient understands and is willing to proceed.  Possibility of intervention at the same time also discussed with patient and they understand and are agreeable to proceed.  Procedure will be done by colleagues with [facility].   Signed: Kerry Hough MD [facility] Cardiology  10/27/2017, 5:00 PM

## 2017-10-28 ENCOUNTER — Observation Stay (HOSPITAL_COMMUNITY)
Admission: RE | Admit: 2017-10-28 | Discharge: 2017-10-29 | Disposition: A | Discharge status: Home / Self Care | Payer: Medicare HMO | Source: Ambulatory Visit | Attending: Cardiology | Admitting: Cardiology

## 2017-10-28 ENCOUNTER — Other Ambulatory Visit: Payer: Medicare HMO

## 2017-10-28 ENCOUNTER — Ambulatory Visit (HOSPITAL_COMMUNITY)
Admission: RE | Disposition: A | Discharge status: Home / Self Care | Payer: Self-pay | Source: Ambulatory Visit | Attending: Cardiology

## 2017-10-28 DIAGNOSIS — Z7982 Long term (current) use of aspirin: Secondary | ICD-10-CM | POA: Diagnosis not present

## 2017-10-28 DIAGNOSIS — I25710 Atherosclerosis of autologous vein coronary artery bypass graft(s) with unstable angina pectoris: Secondary | ICD-10-CM | POA: Diagnosis not present

## 2017-10-28 DIAGNOSIS — I25110 Atherosclerotic heart disease of native coronary artery with unstable angina pectoris: Principal | ICD-10-CM | POA: Insufficient documentation

## 2017-10-28 DIAGNOSIS — E785 Hyperlipidemia, unspecified: Secondary | ICD-10-CM | POA: Insufficient documentation

## 2017-10-28 DIAGNOSIS — I5022 Chronic systolic (congestive) heart failure: Secondary | ICD-10-CM | POA: Insufficient documentation

## 2017-10-28 DIAGNOSIS — Z8673 Personal history of transient ischemic attack (TIA), and cerebral infarction without residual deficits: Secondary | ICD-10-CM | POA: Diagnosis not present

## 2017-10-28 DIAGNOSIS — Z95810 Presence of automatic (implantable) cardiac defibrillator: Secondary | ICD-10-CM | POA: Insufficient documentation

## 2017-10-28 DIAGNOSIS — I200 Unstable angina: Secondary | ICD-10-CM

## 2017-10-28 DIAGNOSIS — Z79899 Other long term (current) drug therapy: Secondary | ICD-10-CM | POA: Diagnosis not present

## 2017-10-28 DIAGNOSIS — Z8249 Family history of ischemic heart disease and other diseases of the circulatory system: Secondary | ICD-10-CM | POA: Diagnosis not present

## 2017-10-28 DIAGNOSIS — G8929 Other chronic pain: Secondary | ICD-10-CM | POA: Insufficient documentation

## 2017-10-28 DIAGNOSIS — E1142 Type 2 diabetes mellitus with diabetic polyneuropathy: Secondary | ICD-10-CM | POA: Insufficient documentation

## 2017-10-28 DIAGNOSIS — Z91040 Latex allergy status: Secondary | ICD-10-CM | POA: Insufficient documentation

## 2017-10-28 DIAGNOSIS — Z794 Long term (current) use of insulin: Secondary | ICD-10-CM | POA: Insufficient documentation

## 2017-10-28 DIAGNOSIS — M79662 Pain in left lower leg: Secondary | ICD-10-CM | POA: Diagnosis not present

## 2017-10-28 DIAGNOSIS — G4733 Obstructive sleep apnea (adult) (pediatric): Secondary | ICD-10-CM | POA: Insufficient documentation

## 2017-10-28 DIAGNOSIS — Z955 Presence of coronary angioplasty implant and graft: Secondary | ICD-10-CM

## 2017-10-28 DIAGNOSIS — I119 Hypertensive heart disease without heart failure: Secondary | ICD-10-CM | POA: Insufficient documentation

## 2017-10-28 HISTORY — PX: CORONARY STENT INTERVENTION: CATH118234

## 2017-10-28 HISTORY — PX: LEFT HEART CATH AND CORS/GRAFTS ANGIOGRAPHY: CATH118250

## 2017-10-28 LAB — COMPREHENSIVE METABOLIC PANEL WITH GFR
ALT: 32 U/L (ref 14–54)
AST: 37 U/L (ref 15–41)
Albumin: 3.5 g/dL (ref 3.5–5.0)
Alkaline Phosphatase: 65 U/L (ref 38–126)
Anion gap: 13 (ref 5–15)
BUN: 28 mg/dL — ABNORMAL HIGH (ref 6–20)
CO2: 21 mmol/L — ABNORMAL LOW (ref 22–32)
Calcium: 9.3 mg/dL (ref 8.9–10.3)
Chloride: 106 mmol/L (ref 101–111)
Creatinine, Ser: 1.02 mg/dL — ABNORMAL HIGH (ref 0.44–1.00)
GFR calc Af Amer: >60 mL/min
GFR calc non Af Amer: 54 mL/min — ABNORMAL LOW
Glucose, Bld: 84 mg/dL (ref 65–99)
Potassium: 4 mmol/L (ref 3.5–5.1)
Sodium: 140 mmol/L (ref 135–145)
Total Bilirubin: 0.7 mg/dL (ref 0.3–1.2)
Total Protein: 6.9 g/dL (ref 6.5–8.1)

## 2017-10-28 LAB — CBC WITH DIFFERENTIAL/PLATELET
Basophils Absolute: 0 K/uL (ref 0.0–0.1)
Basophils Relative: 1 %
Eosinophils Absolute: 0.2 K/uL (ref 0.0–0.7)
Eosinophils Relative: 3 %
HCT: 37.4 % (ref 36.0–46.0)
Hemoglobin: 12.5 g/dL (ref 12.0–15.0)
Lymphocytes Relative: 35 %
Lymphs Abs: 2.2 K/uL (ref 0.7–4.0)
MCH: 31.4 pg (ref 26.0–34.0)
MCHC: 33.4 g/dL (ref 30.0–36.0)
MCV: 94 fL (ref 78.0–100.0)
Monocytes Absolute: 0.5 K/uL (ref 0.1–1.0)
Monocytes Relative: 8 %
Neutro Abs: 3.3 K/uL (ref 1.7–7.7)
Neutrophils Relative %: 53 %
Platelets: 220 K/uL (ref 150–400)
RBC: 3.98 MIL/uL (ref 3.87–5.11)
RDW: 13.1 % (ref 11.5–15.5)
WBC: 6.2 K/uL (ref 4.0–10.5)

## 2017-10-28 LAB — GLUCOSE, CAPILLARY
GLUCOSE-CAPILLARY: 57 mg/dL — AB (ref 65–99)
GLUCOSE-CAPILLARY: 124 mg/dL — AB (ref 65–99)
Glucose-Capillary: 92 mg/dL (ref 65–99)
Glucose-Capillary: 55 mg/dL — ABNORMAL LOW (ref 65–99)
Glucose-Capillary: 97 mg/dL (ref 65–99)
Glucose-Capillary: 57 mg/dL — ABNORMAL LOW (ref 65–99)
Glucose-Capillary: 112 mg/dL — ABNORMAL HIGH (ref 65–99)
Glucose-Capillary: 136 mg/dL — ABNORMAL HIGH (ref 65–99)

## 2017-10-28 LAB — LIPID PANEL
Cholesterol: 169 mg/dL (ref 0–200)
HDL: 37 mg/dL — ABNORMAL LOW (ref >40)
LDL CALC: 96 mg/dL (ref 0–99)
TRIGLYCERIDES: 178 mg/dL — AB (ref <150)
Total CHOL/HDL Ratio: 4.6 RATIO
VLDL: 36 mg/dL (ref 0–40)

## 2017-10-28 LAB — PROTIME-INR
INR: 0.99
Prothrombin Time: 12.9 s (ref 11.4–15.2)

## 2017-10-28 LAB — TROPONIN I: Troponin I: 0.76 ng/mL (ref <0.03)

## 2017-10-28 LAB — APTT: aPTT: 29 seconds (ref 24–36)

## 2017-10-28 LAB — TSH: TSH: 0.876 u[IU]/mL (ref 0.350–4.500)

## 2017-10-28 SURGERY — LEFT HEART CATH AND CORS/GRAFTS ANGIOGRAPHY
Anesthesia: LOCAL

## 2017-10-28 MED ORDER — LISINOPRIL 40 MG PO TABS
40.0000 mg | ORAL_TABLET | Freq: Every day | ORAL | Status: DC
  Administered 2017-10-29: 40 mg via ORAL
  Filled 2017-10-28: qty 1

## 2017-10-28 MED ORDER — ASPIRIN 81 MG PO CHEW: 81.0000 mg | CHEWABLE_TABLET | ORAL | Status: DC

## 2017-10-28 MED ORDER — LORATADINE 10 MG PO TABS
10.0000 mg | ORAL_TABLET | Freq: Every day | ORAL | Status: DC
  Administered 2017-10-28: 18:00:00 10 mg via ORAL
  Filled 2017-10-28: qty 1

## 2017-10-28 MED ORDER — DEXTROSE 50 % IV SOLN
INTRAVENOUS | Status: AC
  Filled 2017-10-28: qty 50

## 2017-10-28 MED ORDER — TICAGRELOR 90 MG PO TABS
ORAL_TABLET | ORAL | Status: DC | PRN
  Administered 2017-10-28: 180 mg via ORAL

## 2017-10-28 MED ORDER — SODIUM CHLORIDE 0.9% FLUSH
3.0000 mL | Freq: Two times a day (BID) | INTRAVENOUS | Status: DC
  Administered 2017-10-28: 3 mL via INTRAVENOUS

## 2017-10-28 MED ORDER — TICAGRELOR 90 MG PO TABS
90.0000 mg | ORAL_TABLET | Freq: Two times a day (BID) | ORAL | Status: DC
  Administered 2017-10-29: 90 mg via ORAL
  Filled 2017-10-28: qty 1

## 2017-10-28 MED ORDER — MIDAZOLAM HCL 2 MG/2ML IJ SOLN
INTRAMUSCULAR | Status: AC
  Filled 2017-10-28: qty 2

## 2017-10-28 MED ORDER — LIDOCAINE HCL (PF) 1 % IJ SOLN
INTRAMUSCULAR | Status: AC
  Filled 2017-10-28: qty 30

## 2017-10-28 MED ORDER — FENTANYL CITRATE (PF) 100 MCG/2ML IJ SOLN
INTRAMUSCULAR | Status: DC | PRN
  Administered 2017-10-28: 25 ug via INTRAVENOUS

## 2017-10-28 MED ORDER — SODIUM CHLORIDE 0.9 % IV SOLN
INTRAVENOUS | Status: DC
  Administered 2017-10-28 (×2): via INTRAVENOUS

## 2017-10-28 MED ORDER — NITROGLYCERIN 0.4 MG SL SUBL: 0.4000 mg | SUBLINGUAL_TABLET | SUBLINGUAL | Status: DC | PRN

## 2017-10-28 MED ORDER — TICAGRELOR 90 MG PO TABS
ORAL_TABLET | ORAL | Status: AC
  Filled 2017-10-28: qty 2

## 2017-10-28 MED ORDER — ONDANSETRON HCL 4 MG/2ML IJ SOLN: 4.0000 mg | Freq: Four times a day (QID) | INTRAMUSCULAR | Status: DC | PRN

## 2017-10-28 MED ORDER — LABETALOL HCL 5 MG/ML IV SOLN: 10.0000 mg | INTRAVENOUS | Status: AC | PRN

## 2017-10-28 MED ORDER — SODIUM CHLORIDE 0.9 % IV SOLN: 250.0000 mL | INTRAVENOUS | Status: DC | PRN

## 2017-10-28 MED ORDER — BIVALIRUDIN TRIFLUOROACETATE 250 MG IV SOLR
INTRAVENOUS | Status: AC
  Filled 2017-10-28: qty 250

## 2017-10-28 MED ORDER — DEXTROSE 50 % IV SOLN
25.0000 mL | Freq: Once | INTRAVENOUS | Status: AC
  Administered 2017-10-28 (×2): 25 mL via INTRAVENOUS

## 2017-10-28 MED ORDER — SODIUM CHLORIDE 0.9 % IV SOLN
INTRAVENOUS | Status: AC | PRN
  Administered 2017-10-28 (×2): 1.75 mg/kg/h via INTRAVENOUS

## 2017-10-28 MED ORDER — SODIUM CHLORIDE 0.9 % IV SOLN
INTRAVENOUS | Status: AC
  Administered 2017-10-28: 16:00:00 via INTRAVENOUS

## 2017-10-28 MED ORDER — SODIUM CHLORIDE 0.9% FLUSH: 3.0000 mL | Freq: Two times a day (BID) | INTRAVENOUS | Status: DC

## 2017-10-28 MED ORDER — MIDAZOLAM HCL 2 MG/2ML IJ SOLN
INTRAMUSCULAR | Status: DC | PRN
  Administered 2017-10-28: 2 mg via INTRAVENOUS

## 2017-10-28 MED ORDER — ACETAMINOPHEN 325 MG PO TABS: 650.0000 mg | ORAL_TABLET | ORAL | Status: DC | PRN

## 2017-10-28 MED ORDER — INSULIN ASPART 100 UNIT/ML ~~LOC~~ SOLN
0.0000 [IU] | Freq: Three times a day (TID) | SUBCUTANEOUS | Status: DC
  Administered 2017-10-29: 09:00:00 4 [IU] via SUBCUTANEOUS

## 2017-10-28 MED ORDER — IOPAMIDOL (ISOVUE-370) INJECTION 76%
INTRAVENOUS | Status: AC
  Filled 2017-10-28: qty 150

## 2017-10-28 MED ORDER — HEPARIN (PORCINE) IN NACL 2-0.9 UNIT/ML-% IJ SOLN
INTRAMUSCULAR | Status: AC | PRN
  Administered 2017-10-28 (×2): 500 mL

## 2017-10-28 MED ORDER — ASPIRIN 81 MG PO CHEW
81.0000 mg | CHEWABLE_TABLET | Freq: Every day | ORAL | Status: DC
  Administered 2017-10-29: 09:00:00 81 mg via ORAL
  Filled 2017-10-28: qty 1

## 2017-10-28 MED ORDER — MORPHINE SULFATE (PF) 4 MG/ML IV SOLN
2.0000 mg | Freq: Once | INTRAVENOUS | Status: AC | PRN
  Administered 2017-10-28: 2 mg via INTRAVENOUS
  Filled 2017-10-28: qty 1

## 2017-10-28 MED ORDER — LIDOCAINE HCL (PF) 1 % IJ SOLN
INTRAMUSCULAR | Status: DC | PRN
  Administered 2017-10-28: 15 mL

## 2017-10-28 MED ORDER — HYDRALAZINE HCL 20 MG/ML IJ SOLN: 5.0000 mg | INTRAMUSCULAR | Status: AC | PRN

## 2017-10-28 MED ORDER — ADENOSINE (DIAGNOSTIC) FOR INTRACORONARY USE
INTRAVENOUS | Status: DC | PRN
  Administered 2017-10-28 (×2): 60 ug via INTRACORONARY
  Administered 2017-10-28: 48 ug via INTRACORONARY

## 2017-10-28 MED ORDER — HEPARIN (PORCINE) IN NACL 2-0.9 UNIT/ML-% IJ SOLN
INTRAMUSCULAR | Status: AC
  Filled 2017-10-28: qty 1000

## 2017-10-28 MED ORDER — IOPAMIDOL (ISOVUE-370) INJECTION 76%
INTRAVENOUS | Status: DC | PRN
  Administered 2017-10-28: 130 mL via INTRA_ARTERIAL

## 2017-10-28 MED ORDER — ALPRAZOLAM 0.25 MG PO TABS
0.2500 mg | ORAL_TABLET | Freq: Once | ORAL | Status: AC
  Administered 2017-10-28: 18:00:00 0.25 mg via ORAL
  Filled 2017-10-28: qty 1

## 2017-10-28 MED ORDER — FENTANYL CITRATE (PF) 100 MCG/2ML IJ SOLN
INTRAMUSCULAR | Status: AC
  Filled 2017-10-28: qty 2

## 2017-10-28 MED ORDER — DEXTROSE 50 % IV SOLN
INTRAVENOUS | Status: AC
  Administered 2017-10-28: 50 mL
  Filled 2017-10-28: qty 50

## 2017-10-28 MED ORDER — SODIUM CHLORIDE 0.9% FLUSH: 3.0000 mL | INTRAVENOUS | Status: DC | PRN

## 2017-10-28 MED ORDER — DEXTROSE 50 % IV SOLN
INTRAVENOUS | Status: AC
  Administered 2017-10-28: 25 mL via INTRAVENOUS
  Filled 2017-10-28: qty 50

## 2017-10-28 MED ORDER — ASPIRIN EC 81 MG PO TBEC: 81.0000 mg | DELAYED_RELEASE_TABLET | Freq: Every day | ORAL | Status: DC

## 2017-10-28 MED ORDER — BIVALIRUDIN BOLUS VIA INFUSION - CUPID
INTRAVENOUS | Status: DC | PRN
  Administered 2017-10-28 (×2): 70.05 mg via INTRAVENOUS

## 2017-10-28 MED ORDER — ANGIOPLASTY BOOK
Freq: Once | Status: AC
  Administered 2017-10-29: 05:00:00
  Filled 2017-10-28: qty 1

## 2017-10-28 MED ORDER — METOPROLOL SUCCINATE ER 50 MG PO TB24
50.0000 mg | ORAL_TABLET | Freq: Two times a day (BID) | ORAL | Status: DC
  Administered 2017-10-28 – 2017-10-29 (×2): 50 mg via ORAL
  Filled 2017-10-28 (×2): qty 1

## 2017-10-28 MED ORDER — PRAVASTATIN SODIUM 40 MG PO TABS
20.0000 mg | ORAL_TABLET | Freq: Every day | ORAL | Status: DC
  Administered 2017-10-28: 18:00:00 20 mg via ORAL
  Filled 2017-10-28: qty 1

## 2017-10-28 MED ORDER — INSULIN ASPART 100 UNIT/ML ~~LOC~~ SOLN
180.0000 [IU] | Freq: Three times a day (TID) | SUBCUTANEOUS | Status: DC
  Administered 2017-10-29: 180 [IU] via SUBCUTANEOUS

## 2017-10-28 SURGICAL SUPPLY — 20 items
BALLN SAPPHIRE 2.0X15 (BALLOONS) ×2
BALLOON SAPPHIRE 2.0X15 (BALLOONS) IMPLANT
CATH INFINITI 5FR MULTPACK ANG (CATHETERS) ×1 IMPLANT
CATH INFINITI 6F ANG MULTIPACK (CATHETERS) ×1 IMPLANT
CATH LAUNCHER 6FR JR4 (CATHETERS) ×1 IMPLANT
COVER PRB 48X5XTLSCP FOLD TPE (BAG) IMPLANT
COVER PROBE 5X48 (BAG) ×2
KIT ENCORE 26 ADVANTAGE (KITS) ×1 IMPLANT
KIT HEART LEFT (KITS) ×2 IMPLANT
KIT HEMO VALVE WATCHDOG (MISCELLANEOUS) ×1 IMPLANT
PACK CARDIAC CATHETERIZATION (CUSTOM PROCEDURE TRAY) ×2 IMPLANT
SHEATH PINNACLE 5F 10CM (SHEATH) ×1 IMPLANT
SHEATH PINNACLE 6F 10CM (SHEATH) ×1 IMPLANT
STENT RESOLUTE ONYX 2.5X12 (Permanent Stent) ×1 IMPLANT
STENT SYNERGY DES 3X20 (Permanent Stent) ×1 IMPLANT
TRANSDUCER W/STOPCOCK (MISCELLANEOUS) ×2 IMPLANT
TUBING CIL FLEX 10 FLL-RA (TUBING) ×2 IMPLANT
WIRE ASAHI PROWATER 180CM (WIRE) ×1 IMPLANT
WIRE EMERALD 3MM-J .035X150CM (WIRE) ×1 IMPLANT
WIRE HI TORQ VERSACORE-J 145CM (WIRE) ×1 IMPLANT

## 2017-10-28 NOTE — Progress Notes (Signed)
5.  S/W CINDY @ [employer] [facility] # # [phone]    BRILINTA 90 MG BID  COVER- YES  CO-PAY- $ 45.00  TIER- 3 DRUG  PRIOR APPROVAL- NO   PREFERRED PHARMACY : [facility], [facility] AND [facility]

## 2017-10-28 NOTE — Care Management Note (Addendum)
Case Management Note  Patient Details  Name: Laurie [name] MRN: [id number] Date of Birth: 1946/08/22  Subjective/Objective:  From home with spouse, pta indep, s/p coronary stent intervention, will be on brilinta.  NCM gave patient the 30 day savings coupon.  NCM gave patient co pay amt of 45.00 , and [facility] on [address] has the brilinta in stock.                Action/Plan: DC home when medically stable.  Expected Discharge Date:                  Expected Discharge Plan:  Home/Self Care  In-House Referral:     Discharge planning Services  CM Consult  Post Acute Care Choice:    Choice offered to:     DME Arranged:    DME Agency:     HH Arranged:    [place] Agency:     Status of Service:  Completed, signed off  If discussed at [employer] of Stay Meetings, dates discussed:    Additional Comments:  [name], RN 10/28/2017, 4:41 PM

## 2017-10-28 NOTE — Interval H&P Note (Signed)
Cath Lab Visit (complete for each Cath Lab visit)  Clinical Evaluation Leading to the Procedure:   ACS: Yes.    Non-ACS:    Anginal Classification: CCS IV  Anti-ischemic medical therapy: Minimal Therapy (1 class of medications)  Non-Invasive Test Results: No non-invasive testing performed  Prior CABG: Previous CABG   Groin approach since there is known left subclavian tortuosity issue.   History and Physical Interval Note:  10/28/2017 2:07 PM  Laurie [name]  has presented today for surgery, with the diagnosis of cp  The various methods of treatment have been discussed with the patient and family. After consideration of risks, benefits and other options for treatment, the patient has consented to  Procedure(s): LEFT HEART CATH AND CORS/GRAFTS ANGIOGRAPHY (N/A) as a surgical intervention .  The patient's history has been reviewed, patient examined, no change in status, stable for surgery.  I have reviewed the patient's chart and labs.  Questions were answered to the patient's satisfaction.     Larae Grooms

## 2017-10-28 NOTE — Progress Notes (Signed)
Site area: right groin  Site Prior to Removal:  Level 0  Pressure Applied For 20 MINUTES    Minutes Beginning at [place]:   Yes.    Patient Status During Pull:  stable  Post Pull Groin Site:  Level 0  Post Pull Instructions Given:  Yes.    Post Pull Pulses Present:  Yes.    Dressing Applied:  Yes.    Comments:

## 2017-10-29 ENCOUNTER — Inpatient Hospital Stay (HOSPITAL_COMMUNITY): Payer: Medicare HMO

## 2017-10-29 DIAGNOSIS — G4733 Obstructive sleep apnea (adult) (pediatric): Secondary | ICD-10-CM | POA: Diagnosis not present

## 2017-10-29 DIAGNOSIS — I200 Unstable angina: Secondary | ICD-10-CM

## 2017-10-29 DIAGNOSIS — Z794 Long term (current) use of insulin: Secondary | ICD-10-CM | POA: Diagnosis not present

## 2017-10-29 DIAGNOSIS — I119 Hypertensive heart disease without heart failure: Secondary | ICD-10-CM | POA: Diagnosis not present

## 2017-10-29 DIAGNOSIS — I5022 Chronic systolic (congestive) heart failure: Secondary | ICD-10-CM | POA: Diagnosis not present

## 2017-10-29 DIAGNOSIS — I25110 Atherosclerotic heart disease of native coronary artery with unstable angina pectoris: Secondary | ICD-10-CM | POA: Diagnosis not present

## 2017-10-29 DIAGNOSIS — I25710 Atherosclerosis of autologous vein coronary artery bypass graft(s) with unstable angina pectoris: Secondary | ICD-10-CM | POA: Diagnosis not present

## 2017-10-29 DIAGNOSIS — Z8673 Personal history of transient ischemic attack (TIA), and cerebral infarction without residual deficits: Secondary | ICD-10-CM | POA: Diagnosis not present

## 2017-10-29 DIAGNOSIS — Z7982 Long term (current) use of aspirin: Secondary | ICD-10-CM | POA: Diagnosis not present

## 2017-10-29 DIAGNOSIS — E785 Hyperlipidemia, unspecified: Secondary | ICD-10-CM | POA: Diagnosis not present

## 2017-10-29 LAB — CBC
HCT: 40.3 % (ref 36.0–46.0)
HEMOGLOBIN: 13.4 g/dL (ref 12.0–15.0)
MCH: 31.2 pg (ref 26.0–34.0)
MCHC: 33.3 g/dL (ref 30.0–36.0)
MCV: 93.7 fL (ref 78.0–100.0)
Platelets: 226 10*3/uL (ref 150–400)
RBC: 4.3 MIL/uL (ref 3.87–5.11)
RDW: 13 % (ref 11.5–15.5)
WBC: 9.6 10*3/uL (ref 4.0–10.5)

## 2017-10-29 LAB — GLUCOSE, CAPILLARY: Glucose-Capillary: 177 mg/dL — ABNORMAL HIGH (ref 65–99)

## 2017-10-29 LAB — BASIC METABOLIC PANEL
Anion gap: 12 (ref 5–15)
BUN: 17 mg/dL (ref 6–20)
CALCIUM: 9.3 mg/dL (ref 8.9–10.3)
CHLORIDE: 107 mmol/L (ref 101–111)
CO2: 21 mmol/L — AB (ref 22–32)
CREATININE: 0.97 mg/dL (ref 0.44–1.00)
GFR calc Af Amer: >60 mL/min (ref >60)
GFR calc non Af Amer: 57 mL/min — ABNORMAL LOW (ref >60)
Glucose, Bld: 127 mg/dL — ABNORMAL HIGH (ref 65–99)
Potassium: 4.2 mmol/L (ref 3.5–5.1)
Sodium: 140 mmol/L (ref 135–145)

## 2017-10-29 LAB — TROPONIN I: Troponin I: 1.22 ng/mL (ref <0.03)

## 2017-10-29 MED ORDER — CLOPIDOGREL BISULFATE 75 MG PO TABS
300.0000 mg | ORAL_TABLET | Freq: Once | ORAL | Status: AC
  Administered 2017-10-29: 300 mg via ORAL
  Filled 2017-10-29: qty 4

## 2017-10-29 MED ORDER — CLOPIDOGREL BISULFATE 75 MG PO TABS: 75.0000 mg | ORAL_TABLET | Freq: Every day | ORAL | 6 refills | Status: DC

## 2017-10-29 MED ORDER — CLOPIDOGREL BISULFATE 75 MG PO TABS: 75.0000 mg | ORAL_TABLET | Freq: Every day | ORAL | Status: DC

## 2017-10-29 MED ORDER — ASPIRIN EC 81 MG PO TBEC: 80.0000 mg | DELAYED_RELEASE_TABLET | Freq: Every day | ORAL | 11 refills | Status: DC

## 2017-10-29 NOTE — Discharge Summary (Signed)
Discharge Summary    Patient ID: Laurie [name],  MRN: [id number], DOB/AGE: [birth year] 72 y.o.  Admit date: 10/28/2017 Discharge date: 10/29/2017  Primary Care Provider: Marletta [name] Primary Cardiologist: Laurie [name]   Discharge Diagnoses    Active Problems:   Unstable angina ([place])   Chronic systolic chf s/p ICD   HTN  OSA on CPAP  HLD   Allergies Allergies  Allergen Reactions  . Statins Other (See Comments)    Statins make the legs ache  . Tricor [Fenofibrate] Other (See Comments)    Leg pain  . Latex Itching and Other (See Comments)    Affected areas turn red, also-  . Tape Rash    Heart leads break out the skin    Diagnostic Studies/Procedures    CORONARY STENT INTERVENTION  LEFT HEART CATH AND CORS/GRAFTS ANGIOGRAPHY  Conclusion     Severe three vessel CAD.  Prox LAD to Mid LAD lesion is 100% stenosed. LIMA to LAD is patent.  1st Diag-1 lesion is 100% stenosed. SVG to diagonal is patent with 80% stenosis.  A drug-eluting stent was successfully placed using a STENT RESOLUTE ONYX 2.5X12 in the SVG to diagonal.  Post intervention, there is a 0% residual stenosis.  Dist LAD lesion is 75% stenosed past LIMA insertion.  2nd Mrg lesion is 100% stenosed.  Ost 1st Mrg to 1st Mrg lesion is 100% stenosed. SVG to OM is occluded.  Post intervention, there is a 0% residual stenosis.  A drug-eluting stent was successfully placed using a STENT SYNERGY DES 3X20. This appears to be a fresh occlusion and was successfully treated.  Mid RCA to Dist RCA lesion is 100% stenosed. SVG to PDA is occluded,  LIMA and is normal in caliber.  Ost Cx to Dist Cx lesion is 70% stenosed.  The left ventricular ejection fraction is 45-50% by visual estimate.  There is mild left ventricular systolic dysfunction.  LV end diastolic pressure is moderately elevated.  There is no aortic valve stenosis.  Successful two vessel PCI to the SVG to diagonal and SVG to OM,  with DES. Continue dual antiplatelet therapy for at least one year and possibly clopidogrel indefinitely going forward. Finish current bag of angiomax.   Possible discharge in AM.      History of Present Illness        72 y.o. female history of long-standing diabetes and had bypass grafting in 0102, chronic systolic chf s/p ICD, HTN, CVA, OSA on CPAP, and HLD presented for outpatient cath for unstable angina.    Hospital Course     Consultants: None  1. Unstable angina - Cath report as above. S/p DES to SVG to diagonal and SVG to OM. Troponin peaked to 1.22 post PCI. No chest pain. She is having SOB after taking Brillinta. Advised  To take with caffeine. She did not tolerated this morning as well. She is loaded with Plavix 300mg  prior to discharge. No chest pain with ambulation however severe SOB. Euvolemic.  Continue ASA, Plavix, and statin. consider BB and ACE/ARB.   The patient has been seen by Dr. Caryl [name]  today and deemed ready for discharge home. All follow-up appointments have been scheduled. Discharge medications are listed below.    Discharge Vitals Blood pressure (!) 107/32, pulse 72, temperature 98.6 F (37 C), temperature source Oral, resp. rate 18, height 5\' 3"  (1.6 m), weight 209 lb 3.5 oz (94.9 kg), SpO2 95 %.  Filed Weights   10/28/17 1002 10/29/17  0345  Weight: 206 lb (93.4 kg) 209 lb 3.5 oz (94.9 kg)   GEN:No acute distress.   Neck:No JVD Cardiac:RRR, no murmurs, rubs, or gallops.  Respiratory:Clear to auscultation bilaterally. R groin cath site with mild bruise.  ZH:GDJM, nontender, non-distended  MS:No edema; No deformity. Neuro:Nonfocal  Psych: Normal affect   Labs & Radiologic Studies     CBC Recent Labs    10/28/17 1044 10/29/17 0248  WBC 6.2 9.6  NEUTROABS 3.3  --   HGB 12.5 13.4  HCT 37.4 40.3  MCV 94.0 93.7  PLT 220 426   Basic Metabolic Panel Recent Labs    10/28/17 1044 10/29/17 0248  NA 140 140  K 4.0 4.2  CL 106 107    CO2 21* 21*  GLUCOSE 84 127*  BUN 28* 17  CREATININE 1.02* 0.97  CALCIUM 9.3 9.3   Liver Function Tests Recent Labs    10/28/17 1044  AST 37  ALT 32  ALKPHOS 65  BILITOT 0.7  PROT 6.9  ALBUMIN 3.5   No results for input(s): LIPASE, AMYLASE in the last 72 hours. Cardiac Enzymes Recent Labs    10/28/17 1038 10/29/17 0248  TROPONINI 0.76* 1.22*   Fasting Lipid Panel Recent Labs    10/28/17 1700  CHOL 169  HDL 37*  LDLCALC 96  TRIG 178*  CHOLHDL 4.6   Thyroid Function Tests Recent Labs    10/28/17 1700  TSH 0.876    X-ray Chest Pa And Lateral  Result Date: 10/29/2017 CLINICAL DATA:  Unstable angina. EXAM: CHEST  2 VIEW COMPARISON:  03/17/2017 FINDINGS: Sternotomy wires and left-sided pacemaker unchanged. Lungs are adequately inflated with subtle prominence of the right infrahilar bronchovascular markings likely vascular crowding. No focal lobar consolidation or effusion. Cardiomediastinal silhouette and remainder of the exam is unchanged to include mild anterior wedging of a midthoracic vertebral body. IMPRESSION: No acute cardiopulmonary disease. Electronically Signed   By: Laurie [name] M.D.   On: 10/29/2017 07:03    Disposition   Pt is being discharged home today in good condition.  Follow-up Plans & Appointments    Follow-up Information    Laurie Reedy, MD. Schedule an appointment as soon as possible for a visit in 1 week(s).   Specialty:  Cardiology Contact information: [place] [address] [phone]          Discharge Instructions    Diet - low sodium heart healthy   Complete by:  As directed    Discharge instructions   Complete by:  As directed    No driving for 48 hours . No lifting over 5 lbs for 1 week. No sexual activity for 1 week. Keep procedure site clean & dry. If you notice increased pain, swelling, bleeding or pus, call/return!  You may shower, but no soaking baths/hot tubs/pools for 1 week.    Increase activity slowly   Complete by:  As directed       Discharge Medications   Allergies as of 10/29/2017      Reactions   Statins Other (See Comments)   Statins make the legs ache   Tricor [fenofibrate] Other (See Comments)   Leg pain   Latex Itching, Other (See Comments)   Affected areas turn red, also-   Tape Rash   Heart leads break out the skin      Medication List    TAKE these medications   ACCU-CHEK FASTCLIX LANCETS Misc USE  TO CHECK BLOOD SUGAR  FOUR TIMES DAILY   ACCU-CHEK SMARTVIEW CONTROL Liqd Use to calibrate blood glucose meter.   ACCU-CHEK SMARTVIEW test strip Generic drug:  glucose blood USE  TO CHECK BLOOD SUGAR FOUR TIMES DAILY   aspirin EC 81 MG tablet Take 1 tablet (81 mg total) by mouth daily. What changed:    medication strength  how much to take   B-D SINGLE USE SWABS REGULAR Pads Use 8 daily for testing blood sugar and insulin injections.   multivitamin with minerals tablet Take 1 tablet by mouth daily.   beta carotene w/minerals tablet Take 1 tablet by mouth daily.   cetirizine 10 MG tablet Commonly known as:  ZYRTEC Take 1 tablet (10 mg total) by mouth daily.   cholecalciferol 1000 units tablet Commonly known as:  VITAMIN D Take 1,000 Units by mouth daily.   clopidogrel 75 MG tablet Commonly known as:  PLAVIX Take 1 tablet (75 mg total) by mouth daily. Start taking on:  10/30/2017   FREESTYLE LIBRE 14 DAY SENSOR Misc 1 Device by Does not apply route every 14 (fourteen) days.   furosemide 40 MG tablet Commonly known as:  LASIX TAKE 2 TABLETS EVERY MORNING AND 1 TABLET EVERY EVENING. TAKE AN ADDITIONAL 1 TABLET FOR WEIGHT GAIN OF 3 LBS OR MORE   GLUCOSAMINE-CHONDROITIN PO Take 1 tablet by mouth daily.   insulin regular 100 units/mL injection Commonly known as:  HUMULIN R Inject 1.8 mLs (180 Units total) into the skin 3 (three) times daily before meals. And syringes 6/day.   Insulin Syringe-Needle U-100 31G X  15/64" 1 ML Misc Commonly known as:  BD VEO INSULIN SYRINGE U/F USE 10 TIMES A DAY WITH INSULIN AS DIRECTED   ketoconazole 2 % cream Commonly known as:  NIZORAL Apply 1 application topically daily as needed for irritation. TO AFFECTED AREAS OF FACE   nitroGLYCERIN 0.4 MG SL tablet Commonly known as:  NITROSTAT Place 0.4 mg under the tongue as needed for chest pain.   potassium chloride SA 20 MEQ tablet Commonly known as:  KLOR-CON M20 Take 1 tablet (20 mEq total) by mouth daily.   pravastatin 20 MG tablet Commonly known as:  PRAVACHOL TAKE 1 TABLET BY MOUTH ONCE DAILY 6 IN THE EVENING   PROBIOTIC DAILY Caps Take 1 capsule by mouth daily.   psyllium 58.6 % powder Commonly known as:  METAMUCIL Take 1 packet by mouth daily as needed (fiber).   vitamin C 500 MG tablet Commonly known as:  ASCORBIC ACID Take 500 mg by mouth every other day.           Outstanding Labs/Studies   None  Duration of Discharge Encounter   Greater than 30 minutes including physician time.  Signed, Crista Luria Lakesha Levinson PA-C 10/29/2017, 8:58 AM

## 2017-10-29 NOTE — Care Management CC44 (Signed)
Condition Code 44 Documentation Completed  Patient Details  Name: Laurie [name] MRN: [id number] Date of Birth: May 06, 1946   Condition Code 44 given:  Yes Patient signature on Condition Code 44 notice:  Yes Documentation of 2 MD's agreement:  Yes Code 44 added to claim:  Yes    Claudie Leach, RN 10/29/2017, 9:42 AM

## 2017-10-29 NOTE — Progress Notes (Signed)
CARDIAC REHAB PHASE I   PRE:  Rate/Rhythm: V paced 76  BP:    Sitting: 131/53     SaO2: 98% Room Air  MODE:  Ambulation: 300 ft   POST:  Rate/Rhythem: 91  BP:    Sitting: 147/53     SaO2: 97% Room Air  [phone] Patient ambulated in hallway using a wheelchair for stability. Patient continues to complain of shortness of breath. " I just don't feel good since I started this medication, I cant take a deep breath, my husband says I sound hoarse." Vin Bhagat PA notified about the patient's complaints. Reviewed exercise instructions, end points of exercise., Stent card use of sublingual nitroglycerin, when to call 911. Reviewed heart healthy diabetic diet with the patient and her husband. Mrs Clemence says she is interested in phase 2 cardiac rehab but not right away.   Harrell Gave RN BSN

## 2017-10-29 NOTE — Progress Notes (Signed)
See discharge summary

## 2017-10-31 ENCOUNTER — Encounter (HOSPITAL_COMMUNITY): Payer: Self-pay | Admitting: Interventional Cardiology

## 2017-10-31 ENCOUNTER — Telehealth (HOSPITAL_COMMUNITY): Payer: Self-pay

## 2017-10-31 MED FILL — Heparin Sodium (Porcine) 2 Unit/ML in Sodium Chloride 0.9%: INTRAMUSCULAR | Qty: 1000 | Status: AC

## 2017-10-31 NOTE — Telephone Encounter (Signed)
Patients insurance is active and benefits verified through [facility] - $10.00 co-pay, no deductible, out of pocket amount of $3,400/$0.00 has been met, no co-insurance, and no pre-authorization is required. Passport/reference [phone]  Patient will be contacted and scheduled upon review by the RN Navigator.

## 2017-11-01 LAB — POCT ACTIVATED CLOTTING TIME
ACTIVATED CLOTTING TIME: 351 s
Activated Clotting Time: 263 seconds

## 2017-11-03 NOTE — Telephone Encounter (Signed)
error 

## 2017-11-04 DIAGNOSIS — Z95810 Presence of automatic (implantable) cardiac defibrillator: Secondary | ICD-10-CM | POA: Diagnosis not present

## 2017-11-04 DIAGNOSIS — I119 Hypertensive heart disease without heart failure: Secondary | ICD-10-CM | POA: Diagnosis not present

## 2017-11-04 DIAGNOSIS — E668 Other obesity: Secondary | ICD-10-CM | POA: Diagnosis not present

## 2017-11-04 DIAGNOSIS — Z955 Presence of coronary angioplasty implant and graft: Secondary | ICD-10-CM | POA: Diagnosis not present

## 2017-11-04 DIAGNOSIS — I2510 Atherosclerotic heart disease of native coronary artery without angina pectoris: Secondary | ICD-10-CM | POA: Diagnosis not present

## 2017-11-04 DIAGNOSIS — E1121 Type 2 diabetes mellitus with diabetic nephropathy: Secondary | ICD-10-CM | POA: Diagnosis not present

## 2017-11-04 DIAGNOSIS — E7849 Other hyperlipidemia: Secondary | ICD-10-CM | POA: Diagnosis not present

## 2017-11-04 DIAGNOSIS — G4733 Obstructive sleep apnea (adult) (pediatric): Secondary | ICD-10-CM | POA: Diagnosis not present

## 2017-11-04 DIAGNOSIS — Z951 Presence of aortocoronary bypass graft: Secondary | ICD-10-CM | POA: Diagnosis not present

## 2017-11-08 ENCOUNTER — Ambulatory Visit (INDEPENDENT_AMBULATORY_CARE_PROVIDER_SITE_OTHER): Payer: Medicare HMO | Admitting: *Deleted

## 2017-11-08 DIAGNOSIS — I428 Other cardiomyopathies: Secondary | ICD-10-CM

## 2017-11-08 NOTE — Progress Notes (Signed)
Remote ICD transmission.   

## 2017-11-09 ENCOUNTER — Telehealth (HOSPITAL_COMMUNITY): Payer: Self-pay

## 2017-11-09 NOTE — Telephone Encounter (Signed)
Attempted to call patient in regards to cardiac rehab - lm on vm °

## 2017-11-10 ENCOUNTER — Encounter: Payer: Self-pay | Admitting: Cardiology

## 2017-11-14 DIAGNOSIS — I2510 Atherosclerotic heart disease of native coronary artery without angina pectoris: Secondary | ICD-10-CM | POA: Diagnosis not present

## 2017-11-17 ENCOUNTER — Telehealth (HOSPITAL_COMMUNITY): Payer: Self-pay

## 2017-11-17 NOTE — Telephone Encounter (Signed)
Called to speak with patient in regards to Cardiac Rehab - Patient stated she is not interested in the program. Closed referral.

## 2017-11-24 LAB — CUP PACEART REMOTE DEVICE CHECK
Brady Statistic AP VP Percent: 4.4 %
Brady Statistic AP VS Percent: 1 %
Brady Statistic AS VS Percent: 1 %
Brady Statistic RA Percent Paced: 4.1 %
Date Time Interrogation Session: 20190226143501
HIGH POWER IMPEDANCE MEASURED VALUE: 75 Ohm
HighPow Impedance: 75 Ohm
Implantable Lead Implant Date: 20140210
Implantable Lead Implant Date: 20170222
Implantable Lead Location: 753858
Implantable Lead Location: 753860
Implantable Lead Model: 5076
Implantable Pulse Generator Implant Date: 20170222
Lead Channel Impedance Value: 350 Ohm
Lead Channel Pacing Threshold Amplitude: 0.5 V
Lead Channel Pacing Threshold Amplitude: 0.75 V
Lead Channel Pacing Threshold Pulse Width: 0.5 ms
Lead Channel Sensing Intrinsic Amplitude: 3.2 mV
Lead Channel Setting Pacing Amplitude: 2 V
Lead Channel Setting Pacing Amplitude: 2 V
Lead Channel Setting Pacing Amplitude: 2 V
Lead Channel Setting Pacing Pulse Width: 0.5 ms
Lead Channel Setting Pacing Pulse Width: 0.5 ms
MDC IDC LEAD IMPLANT DT: 20170222
MDC IDC LEAD LOCATION: 753859
MDC IDC MSMT BATTERY REMAINING LONGEVITY: 62 mo
MDC IDC MSMT BATTERY REMAINING PERCENTAGE: 72 %
MDC IDC MSMT BATTERY VOLTAGE: 2.96 V
MDC IDC MSMT LEADCHNL LV IMPEDANCE VALUE: 400 Ohm
MDC IDC MSMT LEADCHNL LV PACING THRESHOLD AMPLITUDE: 0.75 V
MDC IDC MSMT LEADCHNL LV PACING THRESHOLD PULSEWIDTH: 0.5 ms
MDC IDC MSMT LEADCHNL RV IMPEDANCE VALUE: 530 Ohm
MDC IDC MSMT LEADCHNL RV PACING THRESHOLD PULSEWIDTH: 0.5 ms
MDC IDC MSMT LEADCHNL RV SENSING INTR AMPL: 11.7 mV
MDC IDC SET LEADCHNL RV SENSING SENSITIVITY: 0.5 mV
MDC IDC STAT BRADY AS VP PERCENT: 95 %
Pulse Gen Serial Number: 7341405

## 2017-12-08 ENCOUNTER — Other Ambulatory Visit: Payer: Self-pay | Admitting: Endocrinology

## 2017-12-15 ENCOUNTER — Other Ambulatory Visit: Payer: Self-pay

## 2017-12-15 MED ORDER — FREESTYLE LIBRE 14 DAY READER DEVI: 1.0000 "application " | Freq: Once | 0 refills | Status: AC

## 2017-12-15 NOTE — Progress Notes (Signed)
Pt lost reader requested a new one per pharmacy

## 2017-12-19 ENCOUNTER — Ambulatory Visit (INDEPENDENT_AMBULATORY_CARE_PROVIDER_SITE_OTHER): Payer: Medicare HMO | Admitting: Endocrinology

## 2017-12-19 DIAGNOSIS — Z794 Long term (current) use of insulin: Secondary | ICD-10-CM | POA: Diagnosis not present

## 2017-12-19 DIAGNOSIS — I2510 Atherosclerotic heart disease of native coronary artery without angina pectoris: Secondary | ICD-10-CM | POA: Diagnosis not present

## 2017-12-19 DIAGNOSIS — E0859 Diabetes mellitus due to underlying condition with other circulatory complications: Secondary | ICD-10-CM

## 2017-12-19 DIAGNOSIS — E1159 Type 2 diabetes mellitus with other circulatory complications: Secondary | ICD-10-CM | POA: Diagnosis not present

## 2017-12-19 MED ORDER — INSULIN REGULAR HUMAN 100 UNIT/ML IJ SOLN: INTRAMUSCULAR | 11 refills | Status: DC

## 2017-12-19 NOTE — Patient Instructions (Addendum)
check your blood sugar 4 times a day--before the 3 meals, and at bedtime (the bedtime reading is important, as we may need to increase the suppertime insulin).  also check if you have symptoms of your blood sugar being too high or too low.  please keep a record of the readings and bring it to your next appointment here.  please call us sooner if you are having low blood sugar episodes.  Please increase the insulin to 190 at breakfast, 190 at lunch, and 160 at dinner.    Eat a light snack at bedtime, to avoid the sugar going low in the morning. Please come back for a follow-up appointment in 2 months.

## 2017-12-19 NOTE — Progress Notes (Signed)
Subjective:    Patient ID: Laurie [name], female    DOB: Apr 05, 1946, 72 y.o.   MRN: [id number]  HPI Pt returns for f/u of diabetes mellitus:   DM type: insulin-requiring type 2 Dx'ed: 8676 Complications: peripheral sensory neuropathy, CVA, and CAD.   Therapy: insulin since 2011.   GDM: 1985.   DKA: never Severe hypoglycemia: never.   Pancreatitis: never.   Other: she has severe insulin resistance; she required very little insulin during a hospitalization in early 2014; she wants to delay weight-loss surgery for now; she takes human insulin, due to cost; she has long duration of action of insulin, and based on pattern of cbg's, does not need basal insulin.    Interval history: continuous glucose monitor program for downloading is not working.  She says cbg is lowest at HS and fasting.  She has mild hypoglycemia approx every 2 weeks.  Pt also has multinodular goiter (dx'ed 2011; bx then showed HYPERPLASTIC LESION; she has been euthyroid; f/u US in late 2018 was unchanged).    Past Medical History:  Diagnosis Date  . AICD (automatic cardioverter/defibrillator) present   . Bell's palsy 01/05/2010  . CAD (coronary artery disease)    Cath  07/21/1999  mild ostial, 80% stenosis proximal LAD, 90% stenosis proximal Diag 1, 95% stenosis proximal OM 1, 80% stenosis proximal RCA, 95% stenosis mid RCA    CABG 07/22/99  LIMA to LAD, SVG to dx, SVG to OM, SVG to PDA  Dr. [name]   Cardiolite 2012 no ischemia EF 67%  Cath 10/23/12 Severe native three-vessel coronary artery disease with occlusion of all 3 native coronary arteries, Patent sap  . Chronic lower back pain   . Chronic systolic CHF (congestive heart failure) ([place]) 10/22/2015  . Cystitis   . Hepatitis 1970s   "don't know which" (03/17/2017)  . HIstory of Bell's palsy    October 2012   . Hyperlipidemia    Intolerance to several statins   . Hypertensive heart disease without CHF   . Multinodular goiter   . Obesity (BMI 30-39.9)   . OSA on CPAP     "suppose to wear a mask" (03/17/2017)  . Peripheral neuropathy   . Pneumonia 1990s X 1   "think i had walking pneumonia"  . Seasonal allergies   . Stroke ([place]) 03/2017   "light one"; denies residual on 03/17/2017)  . Type II diabetes mellitus ([place])     Past Surgical History:  Procedure Laterality Date  . BI-VENTRICULAR PACEMAKER UPGRADE  11/05/2015   "upgraded my pacemaker"  . BIOPSY THYROID  05/2010   percutaneous  . CARDIAC CATHETERIZATION N/A 10/21/2015   Procedure: Left Heart Cath and Cors/Grafts Angiography;  Surgeon: Belva Crome, MD;  Location: [place] [facility];  Service: Cardiovascular;  Laterality: N/A;  . CARPAL TUNNEL RELEASE Bilateral   . CATARACT EXTRACTION Right 01/06/2015  . CATARACT EXTRACTION W/ INTRAOCULAR LENS IMPLANT Left 12/16/2014  . CORONARY ARTERY BYPASS GRAFT  2000  . CORONARY STENT INTERVENTION N/A 10/28/2017   Procedure: CORONARY STENT INTERVENTION;  Surgeon: Jettie Booze, MD;  Location: [place] [facility];  Service: Cardiovascular;  Laterality: N/A;  . CYSTECTOMY Right    "middle finger"  . DENTAL IMPLANTS    . EP IMPLANTABLE DEVICE N/A 11/05/2015   Procedure: BiV Upgrade;  Surgeon: Deboraha Sprang, MD;  Location: [place] [facility];  Service: Cardiovascular;  Laterality: N/A;  . INSERT / REPLACE / REMOVE PACEMAKER    . LEAD  REVISION N/A 10/25/2012   Procedure: LEAD REVISION;  Surgeon: Evans Lance, MD;  Location: [facility] CATH LAB;  Service: Cardiovascular;  Laterality: N/A;  . LEFT HEART CATH AND CORS/GRAFTS ANGIOGRAPHY N/A 10/28/2017   Procedure: LEFT HEART CATH AND CORS/GRAFTS ANGIOGRAPHY;  Surgeon: Jettie Booze, MD;  Location: [place] [facility];  Service: Cardiovascular;  Laterality: N/A;  . LEFT HEART CATHETERIZATION WITH CORONARY ANGIOGRAM N/A 10/23/2012   Procedure: LEFT HEART CATHETERIZATION WITH CORONARY ANGIOGRAM;  Surgeon: Jacolyn Reedy, MD;  Location: [facility] CATH LAB;  Service: Cardiovascular;  Laterality: N/A;  . PERMANENT PACEMAKER  INSERTION N/A 10/23/2012   Procedure: PERMANENT PACEMAKER INSERTION;  Surgeon: Deboraha Sprang, MD;  Location: [facility] CATH LAB;  Service: Cardiovascular;  Laterality: N/A;  . PLANTAR FASCIA RELEASE Left    "& clipped a tendon that went thru bottom of my foot"  . TEMPORARY PACEMAKER INSERTION N/A 10/22/2012   Procedure: TEMPORARY PACEMAKER INSERTION;  Surgeon: Troy Sine, MD;  Location: [facility] CATH LAB;  Service: Cardiovascular;  Laterality: N/A;  . TUBAL LIGATION      Social History   Socioeconomic History  . Marital status: Married    Spouse name: Not on file  . Number of children: 2  . Years of education: Not on file  . Highest education level: Not on file  Occupational History    Employer: RETIRED  Social Needs  . Financial resource strain: Not on file  . Food insecurity:    Worry: Not on file    Inability: Not on file  . Transportation needs:    Medical: Not on file    Non-medical: Not on file  Tobacco Use  . Smoking status: Never Smoker  . Smokeless tobacco: Never Used  Substance and Sexual Activity  . Alcohol use: No    Alcohol/week: 0.0 oz  . Drug use: No  . Sexual activity: Never  Lifestyle  . Physical activity:    Days per week: Not on file    Minutes per session: Not on file  . Stress: Not on file  Relationships  . Social connections:    Talks on phone: Not on file    Gets together: Not on file    Attends religious service: Not on file    Active member of club or organization: Not on file    Attends meetings of clubs or organizations: Not on file    Relationship status: Not on file  . Intimate partner violence:    Fear of current or ex partner: Not on file    Emotionally abused: Not on file    Physically abused: Not on file    Forced sexual activity: Not on file  Other Topics Concern  . Not on file  Social History Narrative   Does not work outside the home    Current Outpatient Medications on File Prior to Visit  Medication Sig Dispense Refill  .  ACCU-CHEK FASTCLIX LANCETS MISC USE  TO CHECK BLOOD SUGAR FOUR TIMES DAILY 408 each 2  . ACCU-CHEK SMARTVIEW test strip USE  TO CHECK BLOOD SUGAR FOUR TIMES DAILY 400 each 1  . Alcohol Swabs (B-D SINGLE USE SWABS REGULAR) PADS Use 8 daily for testing blood sugar and insulin injections. 800 each 1  . aspirin 81 MG tablet Take 1 tablet (81 mg total) by mouth daily. 30 tablet 11  . beta carotene w/minerals (OCUVITE) tablet Take 1 tablet by mouth daily.    . Blood Glucose Calibration (ACCU-CHEK SMARTVIEW CONTROL) LIQD  Use to calibrate blood glucose meter. 3 each 1  . cetirizine (ZYRTEC) 10 MG tablet Take 1 tablet (10 mg total) by mouth daily.    . cholecalciferol (VITAMIN D) 1000 UNITS tablet Take 1,000 Units by mouth daily.     . clopidogrel (PLAVIX) 75 MG tablet Take 1 tablet (75 mg total) by mouth daily. 30 tablet 6  . Continuous Blood Gluc Sensor (FREESTYLE LIBRE 14 DAY SENSOR) MISC 1 Device by Does not apply route every 14 (fourteen) days. 6 each 3  . furosemide (LASIX) 40 MG tablet TAKE 2 TABLETS EVERY MORNING AND 1 TABLET EVERY EVENING. TAKE AN ADDITIONAL 1 TABLET FOR WEIGHT GAIN OF 3 LBS OR MORE  360 tablet 2  . GLUCOSAMINE-CHONDROITIN PO Take 1 tablet by mouth daily.    . Insulin Syringe-Needle U-100 (BD VEO INSULIN SYR ULTRAFINE) 31G X 15/64" 1 ML MISC USE 10 TIMES A DAY WITH INSULIN AS DIRECTED 900 each 0  . ketoconazole (NIZORAL) 2 % cream Apply 1 application topically daily as needed for irritation. TO AFFECTED AREAS OF FACE     . Multiple Vitamins-Minerals (MULTIVITAMIN WITH MINERALS) tablet Take 1 tablet by mouth daily.    . nitroGLYCERIN (NITROSTAT) 0.4 MG SL tablet Place 0.4 mg under the tongue as needed for chest pain.     . potassium chloride SA (KLOR-CON M20) 20 MEQ tablet Take 1 tablet (20 mEq total) by mouth daily. 180 tablet 2  . pravastatin (PRAVACHOL) 20 MG tablet TAKE 1 TABLET BY MOUTH ONCE DAILY 6 IN THE EVENING 90 tablet 6  . Probiotic Product (PROBIOTIC DAILY) CAPS Take 1  capsule by mouth daily.    . psyllium (METAMUCIL) 58.6 % powder Take 1 packet by mouth daily as needed (fiber).     . vitamin C (ASCORBIC ACID) 500 MG tablet Take 500 mg by mouth every other day.     No current facility-administered medications on file prior to visit.     Allergies  Allergen Reactions  . Statins Other (See Comments)    Statins make the legs ache  . Tricor [Fenofibrate] Other (See Comments)    Leg pain  . Latex Itching and Other (See Comments)    Affected areas turn red, also-  . Tape Rash    Heart leads break out the skin    Family History  Problem Relation Age of Onset  . Diabetes Father   . Heart disease Father   . Diabetes Mother   . Diabetes Other        Siblings and 2 children  . Heart disease Other        CAD  . Colon cancer Neg Hx   . Colon polyps Neg Hx     There were no vitals taken for this visit.    Review of Systems Denies LOC.      Objective:   Physical Exam GENERAL: no distress Pulses: dorsalis pedis intact bilat.  Skin: no ulcer on the feet. feet are of normal color and temp. There is a healed vein harvest scar at the right leg.  Feet: no deformity.  CV: trace bilat leg edema.  Neuro: sensation is intact to touch on the feet,but decreased from normal.   Lab Results  Component Value Date   CREATININE 0.97 10/29/2017   BUN 17 10/29/2017   NA 140 10/29/2017   K 4.2 10/29/2017   CL 107 10/29/2017   CO2 21 (L) 10/29/2017   A1c=8.3%    Assessment & Plan:  Insulin-requiring  type 2 DM, with CAD: Based on the pattern of her cbg's, she needs some adjustment in her therapy    Patient Instructions  check your blood sugar 4 times a day--before the 3 meals, and at bedtime (the bedtime reading is important, as we may need to increase the suppertime insulin).  also check if you have symptoms of your blood sugar being too high or too low.  please keep a record of the readings and bring it to your next appointment here.  please call us  sooner if you are having low blood sugar episodes.  Please increase the insulin to 190 at breakfast, 190 at lunch, and 160 at dinner.    Eat a light snack at bedtime, to avoid the sugar going low in the morning. Please come back for a follow-up appointment in 2 months.

## 2017-12-21 ENCOUNTER — Telehealth: Payer: Self-pay | Admitting: Endocrinology

## 2017-12-22 ENCOUNTER — Ambulatory Visit: Payer: Medicare HMO | Admitting: Internal Medicine

## 2017-12-27 ENCOUNTER — Ambulatory Visit: Payer: Medicare HMO | Admitting: Internal Medicine

## 2018-01-09 ENCOUNTER — Ambulatory Visit (INDEPENDENT_AMBULATORY_CARE_PROVIDER_SITE_OTHER): Payer: Medicare HMO | Admitting: Internal Medicine

## 2018-01-09 ENCOUNTER — Encounter: Payer: Self-pay | Admitting: Internal Medicine

## 2018-01-09 VITALS — BP 140/60 | HR 83 | Temp 98.2°F | Wt 201.6 lb

## 2018-01-09 DIAGNOSIS — Z794 Long term (current) use of insulin: Secondary | ICD-10-CM

## 2018-01-09 DIAGNOSIS — I119 Hypertensive heart disease without heart failure: Secondary | ICD-10-CM | POA: Diagnosis not present

## 2018-01-09 DIAGNOSIS — E1140 Type 2 diabetes mellitus with diabetic neuropathy, unspecified: Secondary | ICD-10-CM

## 2018-01-09 DIAGNOSIS — I2510 Atherosclerotic heart disease of native coronary artery without angina pectoris: Secondary | ICD-10-CM

## 2018-01-09 DIAGNOSIS — I5022 Chronic systolic (congestive) heart failure: Secondary | ICD-10-CM | POA: Diagnosis not present

## 2018-01-09 NOTE — Progress Notes (Signed)
Subjective:    Patient ID: Laurie [name], female    DOB: [birth year], 72 y.o.   MRN: [id number]  HPI  Lab Results  Component Value Date   HGBA1C 8.3 10/21/2017   72 year old patient who is seen today in follow-up.  She was hospitalized 2 months ago for acute coronary syndrome and is status post stenting.  She has had a remote CABG.  She is followed by endocrinology with diabetes mellitus and recent hemoglobin A1c 2 months ago was 8.3.  Insulin therapy adjusted at that time.  She has hypertension and chronic systolic heart failure.  She remains on statin therapy.   Past Medical History:  Diagnosis Date  . AICD (automatic cardioverter/defibrillator) present   . Bell's palsy 01/05/2010  . CAD (coronary artery disease)    Cath  07/21/1999  mild ostial, 80% stenosis proximal LAD, 90% stenosis proximal Diag 1, 95% stenosis proximal OM 1, 80% stenosis proximal RCA, 95% stenosis mid RCA    CABG 07/22/99  LIMA to LAD, SVG to dx, SVG to OM, SVG to PDA  Dr. [name]   Cardiolite 2012 no ischemia EF 67%  Cath 10/23/12 Severe native three-vessel coronary artery disease with occlusion of all 3 native coronary arteries, Patent sap  . Chronic lower back pain   . Chronic systolic CHF (congestive heart failure) ([place]) 10/22/2015  . Cystitis   . Hepatitis 1970s   "don't know which" (03/17/2017)  . HIstory of Bell's palsy    October 2012   . Hyperlipidemia    Intolerance to several statins   . Hypertensive heart disease without CHF   . Multinodular goiter   . Obesity (BMI 30-39.9)   . OSA on CPAP    "suppose to wear a mask" (03/17/2017)  . Peripheral neuropathy   . Pneumonia 1990s X 1   "think i had walking pneumonia"  . Seasonal allergies   . Stroke ([place]) 03/2017   "light one"; denies residual on 03/17/2017)  . Type II diabetes mellitus ([place])      Social History   Socioeconomic History  . Marital status: Married    Spouse name: Not on file  . Number of children: 2  . Years of education: Not on file    . Highest education level: Not on file  Occupational History    Employer: RETIRED  Social Needs  . Financial resource strain: Not on file  . Food insecurity:    Worry: Not on file    Inability: Not on file  . Transportation needs:    Medical: Not on file    Non-medical: Not on file  Tobacco Use  . Smoking status: Never Smoker  . Smokeless tobacco: Never Used  Substance and Sexual Activity  . Alcohol use: No    Alcohol/week: 0.0 oz  . Drug use: No  . Sexual activity: Never  Lifestyle  . Physical activity:    Days per week: Not on file    Minutes per session: Not on file  . Stress: Not on file  Relationships  . Social connections:    Talks on phone: Not on file    Gets together: Not on file    Attends religious service: Not on file    Active member of club or organization: Not on file    Attends meetings of clubs or organizations: Not on file    Relationship status: Not on file  . Intimate partner violence:    Fear of current or ex partner: Not on file  Emotionally abused: Not on file    Physically abused: Not on file    Forced sexual activity: Not on file  Other Topics Concern  . Not on file  Social History Narrative   Does not work outside the home    Past Surgical History:  Procedure Laterality Date  . BI-VENTRICULAR PACEMAKER UPGRADE  11/05/2015   "upgraded my pacemaker"  . BIOPSY THYROID  05/2010   percutaneous  . CARDIAC CATHETERIZATION N/A 10/21/2015   Procedure: Left Heart Cath and Cors/Grafts Angiography;  Surgeon: Belva Crome, MD;  Location: [facility];  Service: Cardiovascular;  Laterality: N/A;  . CARPAL TUNNEL RELEASE Bilateral   . CATARACT EXTRACTION Right 01/06/2015  . CATARACT EXTRACTION W/ INTRAOCULAR LENS IMPLANT Left 12/16/2014  . CORONARY ARTERY BYPASS GRAFT  2000  . CORONARY STENT INTERVENTION N/A 10/28/2017   Procedure: CORONARY STENT INTERVENTION;  Surgeon: Jettie Booze, MD;  Location: [facility];  Service:  Cardiovascular;  Laterality: N/A;  . CYSTECTOMY Right    "middle finger"  . DENTAL IMPLANTS    . EP IMPLANTABLE DEVICE N/A 11/05/2015   Procedure: BiV Upgrade;  Surgeon: Deboraha Sprang, MD;  Location: [facility];  Service: Cardiovascular;  Laterality: N/A;  . INSERT / REPLACE / REMOVE PACEMAKER    . LEAD REVISION N/A 10/25/2012   Procedure: LEAD REVISION;  Surgeon: Evans Lance, MD;  Location: [facility] CATH LAB;  Service: Cardiovascular;  Laterality: N/A;  . LEFT HEART CATH AND CORS/GRAFTS ANGIOGRAPHY N/A 10/28/2017   Procedure: LEFT HEART CATH AND CORS/GRAFTS ANGIOGRAPHY;  Surgeon: Jettie Booze, MD;  Location: [facility];  Service: Cardiovascular;  Laterality: N/A;  . LEFT HEART CATHETERIZATION WITH CORONARY ANGIOGRAM N/A 10/23/2012   Procedure: LEFT HEART CATHETERIZATION WITH CORONARY ANGIOGRAM;  Surgeon: Jacolyn Reedy, MD;  Location: [facility];  Service: Cardiovascular;  Laterality: N/A;  . PERMANENT PACEMAKER INSERTION N/A 10/23/2012   Procedure: PERMANENT PACEMAKER INSERTION;  Surgeon: Deboraha Sprang, MD;  Location: [facility] CATH LAB;  Service: Cardiovascular;  Laterality: N/A;  . PLANTAR FASCIA RELEASE Left    "& clipped a tendon that went thru bottom of my foot"  . TEMPORARY PACEMAKER INSERTION N/A 10/22/2012   Procedure: TEMPORARY PACEMAKER INSERTION;  Surgeon: Troy Sine, MD;  Location: [facility] CATH LAB;  Service: Cardiovascular;  Laterality: N/A;  . TUBAL LIGATION      Family History  Problem Relation Age of Onset  . Diabetes Father   . Heart disease Father   . Diabetes Mother   . Diabetes Other        Siblings and 2 children  . Heart disease Other        CAD  . Colon cancer Neg Hx   . Colon polyps Neg Hx     Allergies  Allergen Reactions  . Statins Other (See Comments)    Statins make the legs ache  . Tricor [Fenofibrate] Other (See Comments)    Leg pain  . Latex Itching and Other (See Comments)    Affected areas turn red, also-  . Tape Rash    Heart  leads break out the skin    Current Outpatient Medications on File Prior to Visit  Medication Sig Dispense Refill  . ACCU-CHEK FASTCLIX LANCETS MISC USE  TO CHECK BLOOD SUGAR FOUR TIMES DAILY 408 each 2  . ACCU-CHEK SMARTVIEW test strip USE  TO CHECK BLOOD SUGAR FOUR TIMES DAILY 400 each 1  . Alcohol Swabs (  B-D SINGLE USE SWABS REGULAR) PADS Use 8 daily for testing blood sugar and insulin injections. 800 each 1  . aspirin 81 MG tablet Take 1 tablet (81 mg total) by mouth daily. 30 tablet 11  . beta carotene w/minerals (OCUVITE) tablet Take 1 tablet by mouth daily.    . Blood Glucose Calibration (ACCU-CHEK SMARTVIEW CONTROL) LIQD Use to calibrate blood glucose meter. 3 each 1  . cetirizine (ZYRTEC) 10 MG tablet Take 1 tablet (10 mg total) by mouth daily.    . cholecalciferol (VITAMIN D) 1000 UNITS tablet Take 1,000 Units by mouth daily.     . clopidogrel (PLAVIX) 75 MG tablet Take 1 tablet (75 mg total) by mouth daily. 30 tablet 6  . Continuous Blood Gluc Sensor (FREESTYLE LIBRE 14 DAY SENSOR) MISC 1 Device by Does not apply route every 14 (fourteen) days. 6 each 3  . furosemide (LASIX) 40 MG tablet TAKE 2 TABLETS EVERY MORNING AND 1 TABLET EVERY EVENING. TAKE AN ADDITIONAL 1 TABLET FOR WEIGHT GAIN OF 3 LBS OR MORE  360 tablet 2  . GLUCOSAMINE-CHONDROITIN PO Take 1 tablet by mouth daily.    . insulin regular (HUMULIN R) 100 units/mL injection 3 times a day (just before each meal): 190-190-160 units, and syringes 6/day. 180 mL 11  . Insulin Syringe-Needle U-100 (BD VEO INSULIN SYR ULTRAFINE) 31G X 15/64" 1 ML MISC USE 10 TIMES A DAY WITH INSULIN AS DIRECTED 900 each 0  . ketoconazole (NIZORAL) 2 % cream Apply 1 application topically daily as needed for irritation. TO AFFECTED AREAS OF FACE     . Multiple Vitamins-Minerals (MULTIVITAMIN WITH MINERALS) tablet Take 1 tablet by mouth daily.    . nitroGLYCERIN (NITROSTAT) 0.4 MG SL tablet Place 0.4 mg under the tongue as needed for chest pain.     .  potassium chloride SA (KLOR-CON M20) 20 MEQ tablet Take 1 tablet (20 mEq total) by mouth daily. 180 tablet 2  . pravastatin (PRAVACHOL) 20 MG tablet TAKE 1 TABLET BY MOUTH ONCE DAILY 6 IN THE EVENING 90 tablet 6  . Probiotic Product (PROBIOTIC DAILY) CAPS Take 1 capsule by mouth daily.    . psyllium (METAMUCIL) 58.6 % powder Take 1 packet by mouth daily as needed (fiber).     . vitamin C (ASCORBIC ACID) 500 MG tablet Take 500 mg by mouth every other day.     No current facility-administered medications on file prior to visit.     BP 140/60 (BP Location: Right Arm, Patient Position: Sitting, Cuff Size: Large)   Pulse 83   Temp 98.2 F (36.8 C) (Oral)   Wt 201 lb 9.6 oz (91.4 kg)   SpO2 95%   BMI 35.71 kg/m      Review of Systems  Constitutional: Negative.   HENT: Negative for congestion, dental problem, hearing loss, rhinorrhea, sinus pressure, sore throat and tinnitus.   Eyes: Negative for pain, discharge and visual disturbance.  Respiratory: Negative for cough and shortness of breath.   Cardiovascular: Negative for chest pain, palpitations and leg swelling.  Gastrointestinal: Negative for abdominal distention, abdominal pain, blood in stool, constipation, diarrhea, nausea and vomiting.  Genitourinary: Negative for difficulty urinating, dysuria, flank pain, frequency, hematuria, pelvic pain, urgency, vaginal bleeding, vaginal discharge and vaginal pain.  Musculoskeletal: Negative for arthralgias, gait problem and joint swelling.  Skin: Negative for rash.  Neurological: Positive for dizziness. Negative for syncope, speech difficulty, weakness, numbness and headaches.  Hematological: Negative for adenopathy.  Psychiatric/Behavioral: Negative for agitation, behavioral  problems and dysphoric mood. The patient is not nervous/anxious.        Objective:   Physical Exam  Constitutional: She is oriented to person, place, and time. She appears well-developed and well-nourished.  HENT:    Head: Normocephalic.  Right Ear: External ear normal.  Left Ear: External ear normal.  Mouth/Throat: Oropharynx is clear and moist.  Eyes: Pupils are equal, round, and reactive to light. Conjunctivae and EOM are normal.  Neck: Normal range of motion. Neck supple. No thyromegaly present.  Cardiovascular: Normal rate, regular rhythm, normal heart sounds and intact distal pulses.  Dorsalis pedis pulses full.  Posterior tibial pulses not easily palpable  Blood pressure 140/70 bilaterally  Status post sternotomy scar  ICD  left upper anterior chest wall  Pulmonary/Chest: Effort normal and breath sounds normal.  Abdominal: Soft. Bowel sounds are normal. She exhibits no mass. There is no tenderness.  Musculoskeletal: Normal range of motion.  Lymphadenopathy:    She has no cervical adenopathy.  Neurological: She is alert and oriented to person, place, and time.  Skin: Skin is warm and dry. No rash noted.  Psychiatric: She has a normal mood and affect. Her behavior is normal.          Assessment & Plan:  Coronary artery disease Essential hypertension well-controlled Dyslipidemia continue statin therapy Diabetes mellitus.  Continue multiple daily insulin injections.  Follow-up endocrinology  Return in 6 months for follow-up Cardiology follow-up as scheduled  Nyoka Cowden

## 2018-01-09 NOTE — Patient Instructions (Signed)
Limit your sodium (Salt) intake    Please check your hemoglobin A1c every 3 months  Cardiology follow-up as scheduled  Endocrinology follow-up as scheduled

## 2018-02-01 ENCOUNTER — Telehealth: Payer: Self-pay | Admitting: Family Medicine

## 2018-02-01 ENCOUNTER — Other Ambulatory Visit: Payer: Self-pay

## 2018-02-01 DIAGNOSIS — I5022 Chronic systolic (congestive) heart failure: Secondary | ICD-10-CM

## 2018-02-01 NOTE — Telephone Encounter (Signed)
Copied from [place] [id number]. Topic: Referral - Request >> Feb 01, 2018 11:21 AM Ivar Drape wrote: Reason for CRM:   Patient needs a referral for an appt she has on 02/03/18 at 9:15am with Dr. Tollie Eth [phone].

## 2018-02-01 NOTE — Telephone Encounter (Signed)
Okay for referral?  please advise

## 2018-02-01 NOTE — Telephone Encounter (Signed)
Referral placed! Pt aware. No further action needed.

## 2018-02-01 NOTE — Telephone Encounter (Signed)
Okay for referral?

## 2018-02-03 DIAGNOSIS — Z95810 Presence of automatic (implantable) cardiac defibrillator: Secondary | ICD-10-CM | POA: Diagnosis not present

## 2018-02-03 DIAGNOSIS — E668 Other obesity: Secondary | ICD-10-CM | POA: Diagnosis not present

## 2018-02-03 DIAGNOSIS — E1121 Type 2 diabetes mellitus with diabetic nephropathy: Secondary | ICD-10-CM | POA: Diagnosis not present

## 2018-02-03 DIAGNOSIS — E785 Hyperlipidemia, unspecified: Secondary | ICD-10-CM | POA: Diagnosis not present

## 2018-02-03 DIAGNOSIS — G4733 Obstructive sleep apnea (adult) (pediatric): Secondary | ICD-10-CM | POA: Diagnosis not present

## 2018-02-03 DIAGNOSIS — I2510 Atherosclerotic heart disease of native coronary artery without angina pectoris: Secondary | ICD-10-CM | POA: Diagnosis not present

## 2018-02-03 DIAGNOSIS — Z951 Presence of aortocoronary bypass graft: Secondary | ICD-10-CM | POA: Diagnosis not present

## 2018-02-03 DIAGNOSIS — I119 Hypertensive heart disease without heart failure: Secondary | ICD-10-CM | POA: Diagnosis not present

## 2018-02-03 DIAGNOSIS — Z955 Presence of coronary angioplasty implant and graft: Secondary | ICD-10-CM | POA: Diagnosis not present

## 2018-02-07 ENCOUNTER — Ambulatory Visit (INDEPENDENT_AMBULATORY_CARE_PROVIDER_SITE_OTHER): Payer: Medicare HMO | Admitting: *Deleted

## 2018-02-07 DIAGNOSIS — I442 Atrioventricular block, complete: Secondary | ICD-10-CM

## 2018-02-07 DIAGNOSIS — I428 Other cardiomyopathies: Secondary | ICD-10-CM

## 2018-02-07 NOTE — Progress Notes (Signed)
Remote ICD transmission.   

## 2018-02-10 ENCOUNTER — Encounter: Payer: Self-pay | Admitting: Cardiology

## 2018-02-23 ENCOUNTER — Other Ambulatory Visit: Payer: Self-pay | Admitting: Endocrinology

## 2018-02-28 LAB — CUP PACEART REMOTE DEVICE CHECK
Battery Remaining Percentage: 70 %
Brady Statistic AP VS Percent: 1 %
Brady Statistic AS VP Percent: 94 %
Brady Statistic AS VS Percent: 1 %
HighPow Impedance: 71 Ohm
HighPow Impedance: 71 Ohm
Implantable Lead Implant Date: 20140210
Implantable Lead Implant Date: 20170222
Implantable Lead Location: 753858
Implantable Lead Location: 753859
Implantable Lead Location: 753860
Implantable Lead Model: 5076
Implantable Lead Model: 7122
Implantable Pulse Generator Implant Date: 20170222
Lead Channel Impedance Value: 350 Ohm
Lead Channel Impedance Value: 400 Ohm
Lead Channel Impedance Value: 530 Ohm
Lead Channel Pacing Threshold Amplitude: 0.75 V
Lead Channel Pacing Threshold Pulse Width: 0.5 ms
Lead Channel Sensing Intrinsic Amplitude: 12 mV
Lead Channel Sensing Intrinsic Amplitude: 3.3 mV
Lead Channel Setting Pacing Amplitude: 2 V
Lead Channel Setting Pacing Amplitude: 2 V
Lead Channel Setting Pacing Amplitude: 2 V
Lead Channel Setting Pacing Pulse Width: 0.5 ms
Lead Channel Setting Pacing Pulse Width: 0.5 ms
MDC IDC LEAD IMPLANT DT: 20170222
MDC IDC MSMT BATTERY REMAINING LONGEVITY: 60 mo
MDC IDC MSMT BATTERY VOLTAGE: 2.96 V
MDC IDC MSMT LEADCHNL LV PACING THRESHOLD AMPLITUDE: 0.875 V
MDC IDC MSMT LEADCHNL LV PACING THRESHOLD PULSEWIDTH: 0.5 ms
MDC IDC MSMT LEADCHNL RV PACING THRESHOLD AMPLITUDE: 0.625 V
MDC IDC MSMT LEADCHNL RV PACING THRESHOLD PULSEWIDTH: 0.5 ms
MDC IDC PG SERIAL: 7341405
MDC IDC SESS DTM: 20190528090814
MDC IDC SET LEADCHNL RV SENSING SENSITIVITY: 0.5 mV
MDC IDC STAT BRADY AP VP PERCENT: 5.2 %
MDC IDC STAT BRADY RA PERCENT PACED: 4.9 %

## 2018-03-06 ENCOUNTER — Ambulatory Visit: Payer: Medicare HMO | Admitting: Endocrinology

## 2018-03-06 ENCOUNTER — Encounter: Payer: Self-pay | Admitting: Endocrinology

## 2018-03-06 VITALS — BP 138/74 | HR 85 | Wt 203.2 lb

## 2018-03-06 DIAGNOSIS — E1140 Type 2 diabetes mellitus with diabetic neuropathy, unspecified: Secondary | ICD-10-CM

## 2018-03-06 DIAGNOSIS — Z794 Long term (current) use of insulin: Secondary | ICD-10-CM

## 2018-03-06 LAB — POCT GLYCOSYLATED HEMOGLOBIN (HGB A1C): HEMOGLOBIN A1C: 8.6 % — AB (ref 4.0–5.6)

## 2018-03-06 MED ORDER — INSULIN REGULAR HUMAN 100 UNIT/ML IJ SOLN: INTRAMUSCULAR | 5 refills | Status: DC

## 2018-03-06 NOTE — Patient Instructions (Addendum)
check your blood sugar 4 times a day--before the 3 meals, and at bedtime (the bedtime reading is important, as we may need to increase the suppertime insulin).  also check if you have symptoms of your blood sugar being too high or too low.  please keep a record of the readings and bring it to your next appointment here.  please call us sooner if you are having low blood sugar episodes.  Please increase the insulin to 210 at breakfast, 210 at lunch, and 150 at dinner.    Eat a light snack at bedtime, to avoid the sugar going low in the morning.   Please come back for a follow-up appointment in 2 months.

## 2018-03-06 NOTE — Progress Notes (Signed)
Subjective:    Patient ID: Laurie [name], female    DOB: [birth year], 72 y.o.   MRN: [id number]  HPI Pt returns for f/u of diabetes mellitus:   DM type: insulin-requiring type 2 Dx'ed: 2683 Complications: polyneuropathy, CVA, and CAD.   Therapy: insulin since 2011.   GDM: 1985.   DKA: never Severe hypoglycemia: never.   Pancreatitis: never.   Other: she has severe insulin resistance; she required very little insulin during a hospitalization in early 2014; she wants to delay weight-loss surgery for now; she takes human insulin, due to cost; she has long duration of action of insulin, and based on pattern of cbg's, does not need basal insulin.    Interval history: no cbg record, but states she has mild hypoglycemia approx twice week.  This happens fasting.   Pt also has multinodular goiter (dx'ed 2011; bx then showed HYPERPLASTIC LESION; she has been euthyroid; f/u US in late 2018 was unchanged).   Past Medical History:  Diagnosis Date  . AICD (automatic cardioverter/defibrillator) present   . Bell's palsy 01/05/2010  . CAD (coronary artery disease)    Cath  07/21/1999  mild ostial, 80% stenosis proximal LAD, 90% stenosis proximal Diag 1, 95% stenosis proximal OM 1, 80% stenosis proximal RCA, 95% stenosis mid RCA    CABG 07/22/99  LIMA to LAD, SVG to dx, SVG to OM, SVG to PDA  Dr. [name]   Cardiolite 2012 no ischemia EF 67%  Cath 10/23/12 Severe native three-vessel coronary artery disease with occlusion of all 3 native coronary arteries, Patent sap  . Chronic lower back pain   . Chronic systolic CHF (congestive heart failure) ([place]) 10/22/2015  . Cystitis   . Hepatitis 1970s   "don't know which" (03/17/2017)  . HIstory of Bell's palsy    October 2012   . Hyperlipidemia    Intolerance to several statins   . Hypertensive heart disease without CHF   . Multinodular goiter   . Obesity (BMI 30-39.9)   . OSA on CPAP    "suppose to wear a mask" (03/17/2017)  . Peripheral neuropathy   . Pneumonia  1990s X 1   "think i had walking pneumonia"  . Seasonal allergies   . Stroke ([place]) 03/2017   "light one"; denies residual on 03/17/2017)  . Type II diabetes mellitus ([place])     Past Surgical History:  Procedure Laterality Date  . BI-VENTRICULAR PACEMAKER UPGRADE  11/05/2015   "upgraded my pacemaker"  . BIOPSY THYROID  05/2010   percutaneous  . CARDIAC CATHETERIZATION N/A 10/21/2015   Procedure: Left Heart Cath and Cors/Grafts Angiography;  Surgeon: Belva Crome, MD;  Location: [facility];  Service: Cardiovascular;  Laterality: N/A;  . CARPAL TUNNEL RELEASE Bilateral   . CATARACT EXTRACTION Right 01/06/2015  . CATARACT EXTRACTION W/ INTRAOCULAR LENS IMPLANT Left 12/16/2014  . CORONARY ARTERY BYPASS GRAFT  2000  . CORONARY STENT INTERVENTION N/A 10/28/2017   Procedure: CORONARY STENT INTERVENTION;  Surgeon: Jettie Booze, MD;  Location: [facility];  Service: Cardiovascular;  Laterality: N/A;  . CYSTECTOMY Right    "middle finger"  . DENTAL IMPLANTS    . EP IMPLANTABLE DEVICE N/A 11/05/2015   Procedure: BiV Upgrade;  Surgeon: Deboraha Sprang, MD;  Location: [facility];  Service: Cardiovascular;  Laterality: N/A;  . INSERT / REPLACE / REMOVE PACEMAKER    . LEAD REVISION N/A 10/25/2012   Procedure: LEAD REVISION;  Surgeon: Evans Lance, MD;  Location: [facility];  Service: Cardiovascular;  Laterality: N/A;  . LEFT HEART CATH AND CORS/GRAFTS ANGIOGRAPHY N/A 10/28/2017   Procedure: LEFT HEART CATH AND CORS/GRAFTS ANGIOGRAPHY;  Surgeon: Jettie Booze, MD;  Location: [place];  Service: Cardiovascular;  Laterality: N/A;  . LEFT HEART CATHETERIZATION WITH CORONARY ANGIOGRAM N/A 10/23/2012   Procedure: LEFT HEART CATHETERIZATION WITH CORONARY ANGIOGRAM;  Surgeon: Jacolyn Reedy, MD;  Location: [facility] C[facility] LAB;  Service: Cardiovascular;  Laterality: N/A;  . PERMANENT PACEMAKER INSERTION N/A 10/23/2012   Procedure: PERMANENT PACEMAKER INSERTION;  Surgeon:  Deboraha Sprang, MD;  Location: [facility] C[facility] LAB;  Service: Cardiovascular;  Laterality: N/A;  . PLANTAR FASCIA RELEASE Left    "& clipped a tendon that went thru bottom of my foot"  . TEMPORARY PACEMAKER INSERTION N/A 10/22/2012   Procedure: TEMPORARY PACEMAKER INSERTION;  Surgeon: Troy Sine, MD;  Location: [facility] CATH LAB;  Service: Cardiovascular;  Laterality: N/A;  . TUBAL LIGATION      Social History   Socioeconomic History  . Marital status: Married    Spouse name: Not on file  . Number of children: 2  . Years of education: Not on file  . Highest education level: Not on file  Occupational History    Employer: RETIRED  Social Needs  . Financial resource strain: Not on file  . Food insecurity:    Worry: Not on file    Inability: Not on file  . Transportation needs:    Medical: Not on file    Non-medical: Not on file  Tobacco Use  . Smoking status: Never Smoker  . Smokeless tobacco: Never Used  Substance and Sexual Activity  . Alcohol use: No    Alcohol/week: 0.0 oz  . Drug use: No  . Sexual activity: Never  Lifestyle  . Physical activity:    Days per week: Not on file    Minutes per session: Not on file  . Stress: Not on file  Relationships  . Social connections:    Talks on phone: Not on file    Gets together: Not on file    Attends religious service: Not on file    Active member of club or organization: Not on file    Attends meetings of clubs or organizations: Not on file    Relationship status: Not on file  . Intimate partner violence:    Fear of current or ex partner: Not on file    Emotionally abused: Not on file    Physically abused: Not on file    Forced sexual activity: Not on file  Other Topics Concern  . Not on file  Social History Narrative   Does not work outside the home    Current Outpatient Medications on File Prior to Visit  Medication Sig Dispense Refill  . ACCU-CHEK FASTCLIX LANCETS MISC USE  TO CHECK BLOOD SUGAR FOUR TIMES DAILY 408 each 2    . ACCU-CHEK SMARTVIEW test strip USE  TO CHECK BLOOD SUGAR FOUR TIMES DAILY 400 each 1  . Alcohol Swabs (B-D SINGLE USE SWABS REGULAR) PADS Use 8 daily for testing blood sugar and insulin injections. 800 each 1  . aspirin 81 MG tablet Take 1 tablet (81 mg total) by mouth daily. 30 tablet 11  . beta carotene w/minerals (OCUVITE) tablet Take 1 tablet by mouth daily.    . Blood Glucose Calibration (ACCU-CHEK SMARTVIEW CONTROL) LIQD Use to calibrate blood glucose meter. 3 each 1  . cetirizine (ZYRTEC) 10  MG tablet Take 1 tablet (10 mg total) by mouth daily.    . cholecalciferol (VITAMIN D) 1000 UNITS tablet Take 1,000 Units by mouth daily.     . clopidogrel (PLAVIX) 75 MG tablet Take 1 tablet (75 mg total) by mouth daily. 30 tablet 6  . Continuous Blood Gluc Sensor (FREESTYLE LIBRE 14 DAY SENSOR) MISC 1 Device by Does not apply route every 14 (fourteen) days. 6 each 3  . furosemide (LASIX) 40 MG tablet TAKE 2 TABLETS EVERY MORNING AND 1 TABLET EVERY EVENING. TAKE AN ADDITIONAL 1 TABLET FOR WEIGHT GAIN OF 3 LBS OR MORE  360 tablet 2  . GLUCOSAMINE-CHONDROITIN PO Take 1 tablet by mouth daily.    . Insulin Syringe-Needle U-100 (BD VEO INSULIN SYR ULTRAFINE) 31G X 15/64" 1 ML MISC USE 10 TIMES A DAY WITH INSULIN AS DIRECTED 900 each 0  . ketoconazole (NIZORAL) 2 % cream Apply 1 application topically daily as needed for irritation. TO AFFECTED AREAS OF FACE     . Multiple Vitamins-Minerals (MULTIVITAMIN WITH MINERALS) tablet Take 1 tablet by mouth daily.    . nitroGLYCERIN (NITROSTAT) 0.4 MG SL tablet Place 0.4 mg under the tongue as needed for chest pain.     . potassium chloride SA (KLOR-CON M20) 20 MEQ tablet Take 1 tablet (20 mEq total) by mouth daily. 180 tablet 2  . pravastatin (PRAVACHOL) 20 MG tablet TAKE 1 TABLET BY MOUTH ONCE DAILY 6 IN THE EVENING 90 tablet 6  . Probiotic Product (PROBIOTIC DAILY) CAPS Take 1 capsule by mouth daily.    . psyllium (METAMUCIL) 58.6 % powder Take 1 packet by mouth  daily as needed (fiber).     . vitamin C (ASCORBIC ACID) 500 MG tablet Take 500 mg by mouth every other day.     No current facility-administered medications on file prior to visit.     Allergies  Allergen Reactions  . Statins Other (See Comments)    Statins make the legs ache  . Tricor [Fenofibrate] Other (See Comments)    Leg pain  . Latex Itching and Other (See Comments)    Affected areas turn red, also-  . Tape Rash    Heart leads break out the skin    Family History  Problem Relation Age of Onset  . Diabetes Father   . Heart disease Father   . Diabetes Mother   . Diabetes Other        Siblings and 2 children  . Heart disease Other        CAD  . Colon cancer Neg Hx   . Colon polyps Neg Hx     BP 138/74 (BP Location: Right Arm, Patient Position: Sitting, Cuff Size: Normal)   Pulse 85   Wt 203 lb 3.2 oz (92.2 kg)   SpO2 95%   BMI 36.00 kg/m    Review of Systems Denies LOC    Objective:   Physical Exam VITAL SIGNS:  See vs page GENERAL: no distress Pulses: dorsalis pedis intact bilat.  Skin: no ulcer on the feet. feet are of normal color and temp. There is a healed vein harvest scar at the right leg.  Feet: no deformity.  CV: trace bilat leg edema.  Neuro: sensation is intact to touch on the feet,but decreased from normal.    Lab Results  Component Value Date   HGBA1C 8.6 (A) 03/06/2018   Lab Results  Component Value Date   CREATININE 0.97 10/29/2017   BUN 17 10/29/2017  NA 140 10/29/2017   K 4.2 10/29/2017   CL 107 10/29/2017   CO2 21 (L) 10/29/2017       Assessment & Plan:  Insulin-requiring type 2 DM, with CAD: worse Hypoglycemia, due to long duration of action of insulin: we need to reduce supper insulin.   Patient Instructions  check your blood sugar 4 times a day--before the 3 meals, and at bedtime (the bedtime reading is important, as we may need to increase the suppertime insulin).  also check if you have symptoms of your blood  sugar being too high or too low.  please keep a record of the readings and bring it to your next appointment here.  please call us sooner if you are having low blood sugar episodes.  Please increase the insulin to 210 at breakfast, 210 at lunch, and 150 at dinner.    Eat a light snack at bedtime, to avoid the sugar going low in the morning.   Please come back for a follow-up appointment in 2 months.

## 2018-03-27 ENCOUNTER — Telehealth: Payer: Self-pay | Admitting: Internal Medicine

## 2018-03-27 NOTE — Telephone Encounter (Signed)
Pt dropped off a [place] [employer] Handicapped Plate Form to be filled out.  Upon completion pt would like to be called @ [phone] and to  pick the form up.  Form was put in providers folder.

## 2018-03-28 NOTE — Telephone Encounter (Signed)
Are these forms completed?  Please advise

## 2018-03-29 NOTE — Telephone Encounter (Signed)
Paperwork ready for pick up. Pt notified via Vm.

## 2018-03-29 NOTE — Telephone Encounter (Signed)
Forms completed

## 2018-05-08 ENCOUNTER — Ambulatory Visit: Payer: Medicare HMO | Admitting: Endocrinology

## 2018-05-08 VITALS — BP 130/72 | HR 89 | Temp 98.1°F | Resp 16 | Wt 203.0 lb

## 2018-05-08 DIAGNOSIS — Z794 Long term (current) use of insulin: Secondary | ICD-10-CM

## 2018-05-08 DIAGNOSIS — E1140 Type 2 diabetes mellitus with diabetic neuropathy, unspecified: Secondary | ICD-10-CM

## 2018-05-08 LAB — POCT GLYCOSYLATED HEMOGLOBIN (HGB A1C): Hemoglobin A1C: 8.6 % — AB (ref 4.0–5.6)

## 2018-05-08 MED ORDER — INSULIN REGULAR HUMAN 100 UNIT/ML IJ SOLN: INTRAMUSCULAR | 5 refills | Status: DC

## 2018-05-08 NOTE — Progress Notes (Signed)
Subjective:    Patient ID: Laurie [name], female    DOB: 10-Nov-1945, 72 y.o.   MRN: [id number]  HPI Pt returns for f/u of diabetes mellitus:   DM type: insulin-requiring type 2 Dx'ed: 7939 Complications: polyneuropathy, CVA, and CAD.   Therapy: insulin since 2011.   GDM: 1985.   DKA: never Severe hypoglycemia: never.   Pancreatitis: never.   Other: she has severe insulin resistance; she required very little insulin during a hospitalization in early 2014; she wants to delay weight-loss surgery for now; she takes human insulin, due to cost; she has long duration of action of insulin, and based on pattern of cbg's, does not need basal insulin.    Interval history: she brings a record of her cbg's which I have reviewed today.  It varies from 59-300's.  It is lowest fasting, but she does not check at HS.  she has mild hypoglycemia approx twice week.  This happens fasting.   Pt also has multinodular goiter (dx'ed 2011; bx then showed HYPERPLASTIC LESION; she has been euthyroid; f/u US in late 2018 was unchanged).   Past Medical History:  Diagnosis Date  . AICD (automatic cardioverter/defibrillator) present   . Bell's palsy 01/05/2010  . CAD (coronary artery disease)    Cath  07/21/1999  mild ostial, 80% stenosis proximal LAD, 90% stenosis proximal Diag 1, 95% stenosis proximal OM 1, 80% stenosis proximal RCA, 95% stenosis mid RCA    CABG 07/22/99  LIMA to LAD, SVG to dx, SVG to OM, SVG to PDA  Dr. [name]   Cardiolite 2012 no ischemia EF 67%  Cath 10/23/12 Severe native three-vessel coronary artery disease with occlusion of all 3 native coronary arteries, Patent sap  . Chronic lower back pain   . Chronic systolic CHF (congestive heart failure) ([place]) 10/22/2015  . Cystitis   . Hepatitis 1970s   "don't know which" (03/17/2017)  . HIstory of Bell's palsy    October 2012   . Hyperlipidemia    Intolerance to several statins   . Hypertensive heart disease without CHF   . Multinodular goiter   .  Obesity (BMI 30-39.9)   . OSA on CPAP    "suppose to wear a mask" (03/17/2017)  . Peripheral neuropathy   . Pneumonia 1990s X 1   "think i had walking pneumonia"  . Seasonal allergies   . Stroke ([place]) 03/2017   "light one"; denies residual on 03/17/2017)  . Type II diabetes mellitus ([place])     Past Surgical History:  Procedure Laterality Date  . BI-VENTRICULAR PACEMAKER UPGRADE  11/05/2015   "upgraded my pacemaker"  . BIOPSY THYROID  05/2010   percutaneous  . CARDIAC CATHETERIZATION N/A 10/21/2015   Procedure: Left Heart Cath and Cors/Grafts Angiography;  Surgeon: Belva Crome, MD;  Location: [facility];  Service: Cardiovascular;  Laterality: N/A;  . CARPAL TUNNEL RELEASE Bilateral   . CATARACT EXTRACTION Right 01/06/2015  . CATARACT EXTRACTION W/ INTRAOCULAR LENS IMPLANT Left 12/16/2014  . CORONARY ARTERY BYPASS GRAFT  2000  . CORONARY STENT INTERVENTION N/A 10/28/2017   Procedure: CORONARY STENT INTERVENTION;  Surgeon: Jettie Booze, MD;  Location: [facility];  Service: Cardiovascular;  Laterality: N/A;  . CYSTECTOMY Right    "middle finger"  . DENTAL IMPLANTS    . EP IMPLANTABLE DEVICE N/A 11/05/2015   Procedure: BiV Upgrade;  Surgeon: Deboraha Sprang, MD;  Location: [facility];  Service: Cardiovascular;  Laterality: N/A;  .  INSERT / REPLACE / REMOVE PACEMAKER    . LEAD REVISION N/A 10/25/2012   Procedure: LEAD REVISION;  Surgeon: Evans Lance, MD;  Location: [facility];  Service: Cardiovascular;  Laterality: N/A;  . LEFT HEART CATH AND CORS/GRAFTS ANGIOGRAPHY N/A 10/28/2017   Procedure: LEFT HEART CATH AND CORS/GRAFTS ANGIOGRAPHY;  Surgeon: Jettie Booze, MD;  Location: [facility];  Service: Cardiovascular;  Laterality: N/A;  . LEFT HEART CATHETERIZATION WITH CORONARY ANGIOGRAM N/A 10/23/2012   Procedure: LEFT HEART CATHETERIZATION WITH CORONARY ANGIOGRAM;  Surgeon: Jacolyn Reedy, MD;  Location: [facility] LAB;  Service: Cardiovascular;   Laterality: N/A;  . PERMANENT PACEMAKER INSERTION N/A 10/23/2012   Procedure: PERMANENT PACEMAKER INSERTION;  Surgeon: Deboraha Sprang, MD;  Location: [facility] CATH LAB;  Service: Cardiovascular;  Laterality: N/A;  . PLANTAR FASCIA RELEASE Left    "& clipped a tendon that went thru bottom of my foot"  . TEMPORARY PACEMAKER INSERTION N/A 10/22/2012   Procedure: TEMPORARY PACEMAKER INSERTION;  Surgeon: Troy Sine, MD;  Location: [facility] CATH LAB;  Service: Cardiovascular;  Laterality: N/A;  . TUBAL LIGATION      Social History   Socioeconomic History  . Marital status: Married    Spouse name: Not on file  . Number of children: 2  . Years of education: Not on file  . Highest education level: Not on file  Occupational History    Employer: RETIRED  Social Needs  . Financial resource strain: Not on file  . Food insecurity:    Worry: Not on file    Inability: Not on file  . Transportation needs:    Medical: Not on file    Non-medical: Not on file  Tobacco Use  . Smoking status: Never Smoker  . Smokeless tobacco: Never Used  Substance and Sexual Activity  . Alcohol use: No    Alcohol/week: 0.0 standard drinks  . Drug use: No  . Sexual activity: Never  Lifestyle  . Physical activity:    Days per week: Not on file    Minutes per session: Not on file  . Stress: Not on file  Relationships  . Social connections:    Talks on phone: Not on file    Gets together: Not on file    Attends religious service: Not on file    Active member of club or organization: Not on file    Attends meetings of clubs or organizations: Not on file    Relationship status: Not on file  . Intimate partner violence:    Fear of current or ex partner: Not on file    Emotionally abused: Not on file    Physically abused: Not on file    Forced sexual activity: Not on file  Other Topics Concern  . Not on file  Social History Narrative   Does not work outside the home    Current Outpatient Medications on File Prior  to Visit  Medication Sig Dispense Refill  . ACCU-CHEK FASTCLIX LANCETS MISC USE  TO CHECK BLOOD SUGAR FOUR TIMES DAILY 408 each 2  . ACCU-CHEK SMARTVIEW test strip USE  TO CHECK BLOOD SUGAR FOUR TIMES DAILY 400 each 1  . Alcohol Swabs (B-D SINGLE USE SWABS REGULAR) PADS Use 8 daily for testing blood sugar and insulin injections. 800 each 1  . aspirin 81 MG tablet Take 1 tablet (81 mg total) by mouth daily. 30 tablet 11  . beta carotene w/minerals (OCUVITE) tablet Take 1 tablet by mouth  daily.    . Blood Glucose Calibration (ACCU-CHEK SMARTVIEW CONTROL) LIQD Use to calibrate blood glucose meter. 3 each 1  . cetirizine (ZYRTEC) 10 MG tablet Take 1 tablet (10 mg total) by mouth daily.    . cholecalciferol (VITAMIN D) 1000 UNITS tablet Take 1,000 Units by mouth daily.     . clopidogrel (PLAVIX) 75 MG tablet Take 1 tablet (75 mg total) by mouth daily. 30 tablet 6  . Continuous Blood Gluc Sensor (FREESTYLE LIBRE 14 DAY SENSOR) MISC 1 Device by Does not apply route every 14 (fourteen) days. 6 each 3  . furosemide (LASIX) 40 MG tablet TAKE 2 TABLETS EVERY MORNING AND 1 TABLET EVERY EVENING. TAKE AN ADDITIONAL 1 TABLET FOR WEIGHT GAIN OF 3 LBS OR MORE  360 tablet 2  . GLUCOSAMINE-CHONDROITIN PO Take 1 tablet by mouth daily.    . Insulin Syringe-Needle U-100 (BD VEO INSULIN SYR ULTRAFINE) 31G X 15/64" 1 ML MISC USE 10 TIMES A DAY WITH INSULIN AS DIRECTED 900 each 0  . ketoconazole (NIZORAL) 2 % cream Apply 1 application topically daily as needed for irritation. TO AFFECTED AREAS OF FACE     . Multiple Vitamins-Minerals (MULTIVITAMIN WITH MINERALS) tablet Take 1 tablet by mouth daily.    . nitroGLYCERIN (NITROSTAT) 0.4 MG SL tablet Place 0.4 mg under the tongue as needed for chest pain.     . potassium chloride SA (KLOR-CON M20) 20 MEQ tablet Take 1 tablet (20 mEq total) by mouth daily. 180 tablet 2  . pravastatin (PRAVACHOL) 20 MG tablet TAKE 1 TABLET BY MOUTH ONCE DAILY 6 IN THE EVENING 90 tablet 6  .  Probiotic Product (PROBIOTIC DAILY) CAPS Take 1 capsule by mouth daily.    . psyllium (METAMUCIL) 58.6 % powder Take 1 packet by mouth daily as needed (fiber).     . vitamin C (ASCORBIC ACID) 500 MG tablet Take 500 mg by mouth every other day.     No current facility-administered medications on file prior to visit.     Allergies  Allergen Reactions  . Statins Other (See Comments)    Statins make the legs ache  . Tricor [Fenofibrate] Other (See Comments)    Leg pain  . Latex Itching and Other (See Comments)    Affected areas turn red, also-  . Tape Rash    Heart leads break out the skin    Family History  Problem Relation Age of Onset  . Diabetes Father   . Heart disease Father   . Diabetes Mother   . Diabetes Other        Siblings and 2 children  . Heart disease Other        CAD  . Colon cancer Neg Hx   . Colon polyps Neg Hx     BP 130/72   Pulse 89   Temp 98.1 F (36.7 C) (Oral)   Resp 16   Wt 203 lb (92.1 kg)   SpO2 98%   BMI 35.96 kg/m    Review of Systems Denies LOC    Objective:   Physical Exam VITAL SIGNS:  See vs page GENERAL: no distress Pulses: dorsalis pedis intact bilat.  Skin: no ulcer on the feet. feet are of normal color and temp. There is a healed vein harvest scar at the right leg.  Feet: no deformity.  CV: 1+ bilat leg edema.  Neuro: sensation is intact to touch on the feet,but decreased from normal.    Lab Results  Component Value  Date   HGBA1C 8.6 (A) 05/08/2018       Assessment & Plan:  Insulin-requiring type 2 DM, with CAD.  She needs increased rx.   Hypoglycemia: this is limiting aggressiveness of glycemic control.    Severe insulin resistance: high insulin dosage is causing hypoglycemia many hrs after injection.   Patient Instructions  check your blood sugar 4 times a day--before the 3 meals, and at bedtime (the bedtime reading is important, as we may need to increase the suppertime insulin).  also check if you have  symptoms of your blood sugar being too high or too low.  please keep a record of the readings and bring it to your next appointment here.  please call us sooner if you are having low blood sugar episodes.  Please increase the insulin to 220 at breakfast, 220 at lunch, and 140 at dinner.   Eat a light snack at bedtime, to avoid the sugar going low in the morning.   Please come back for a follow-up appointment in 2 months.

## 2018-05-08 NOTE — Patient Instructions (Addendum)
check your blood sugar 4 times a day--before the 3 meals, and at bedtime (the bedtime reading is important, as we may need to increase the suppertime insulin).  also check if you have symptoms of your blood sugar being too high or too low.  please keep a record of the readings and bring it to your next appointment here.  please call us sooner if you are having low blood sugar episodes.  Please increase the insulin to 220 at breakfast, 220 at lunch, and 140 at dinner.   Eat a light snack at bedtime, to avoid the sugar going low in the morning.   Please come back for a follow-up appointment in 2 months.

## 2018-05-09 ENCOUNTER — Ambulatory Visit (INDEPENDENT_AMBULATORY_CARE_PROVIDER_SITE_OTHER): Payer: Medicare HMO | Admitting: *Deleted

## 2018-05-09 DIAGNOSIS — I442 Atrioventricular block, complete: Secondary | ICD-10-CM

## 2018-05-09 NOTE — Progress Notes (Signed)
Remote ICD transmission.   

## 2018-05-11 ENCOUNTER — Encounter: Payer: Self-pay | Admitting: Cardiology

## 2018-06-06 ENCOUNTER — Other Ambulatory Visit: Payer: Self-pay | Admitting: *Deleted

## 2018-06-06 ENCOUNTER — Telehealth: Payer: Self-pay | Admitting: Internal Medicine

## 2018-06-06 MED ORDER — FUROSEMIDE 40 MG PO TABS: ORAL_TABLET | ORAL | 0 refills | Status: DC

## 2018-06-06 NOTE — Telephone Encounter (Signed)
Patient requested a refill on furosemide. I made her aware that she is overdue for an appointment and that she would need to schedule and then a refill could be sent in to last her until she comes in and further refills could be requested at that visit. She verbalized her understanding.

## 2018-06-06 NOTE — Telephone Encounter (Signed)
New message:       *STAT* If patient is at the pharmacy, call can be transferred to refill team.   1. Which medications need to be refilled? (please list name of each medication and dose if known) furosemide (LASIX) 40 MG tablet  2. Which pharmacy/location (including street and city if local pharmacy) is medication to be sent to?[place], [place] - [address]  3. Do they need a 30 day or 90 day supply? [place]

## 2018-06-12 LAB — CUP PACEART REMOTE DEVICE CHECK
Battery Remaining Longevity: 58 mo
Battery Remaining Percentage: 67 %
Battery Voltage: 2.96 V
Brady Statistic AP VP Percent: 6.6 %
Brady Statistic RA Percent Paced: 6.1 %
HighPow Impedance: 71 Ohm
HighPow Impedance: 71 Ohm
Implantable Lead Implant Date: 20170222
Implantable Lead Implant Date: 20170222
Implantable Lead Location: 753859
Implantable Lead Location: 753860
Implantable Lead Model: 4298
Implantable Lead Model: 5076
Lead Channel Impedance Value: 360 Ohm
Lead Channel Impedance Value: 440 Ohm
Lead Channel Impedance Value: 530 Ohm
Lead Channel Pacing Threshold Amplitude: 0.625 V
Lead Channel Pacing Threshold Amplitude: 0.75 V
Lead Channel Pacing Threshold Pulse Width: 0.5 ms
Lead Channel Pacing Threshold Pulse Width: 0.5 ms
Lead Channel Sensing Intrinsic Amplitude: 12 mV
Lead Channel Setting Pacing Amplitude: 2 V
Lead Channel Setting Pacing Amplitude: 2 V
Lead Channel Setting Pacing Pulse Width: 0.5 ms
Lead Channel Setting Pacing Pulse Width: 0.5 ms
MDC IDC LEAD IMPLANT DT: 20140210
MDC IDC LEAD LOCATION: 753858
MDC IDC MSMT LEADCHNL LV PACING THRESHOLD AMPLITUDE: 0.875 V
MDC IDC MSMT LEADCHNL RA PACING THRESHOLD PULSEWIDTH: 0.5 ms
MDC IDC MSMT LEADCHNL RA SENSING INTR AMPL: 3.6 mV
MDC IDC PG IMPLANT DT: 20170222
MDC IDC PG SERIAL: 7341405
MDC IDC SESS DTM: 20190827094217
MDC IDC SET LEADCHNL LV PACING AMPLITUDE: 2 V
MDC IDC SET LEADCHNL RV SENSING SENSITIVITY: 0.5 mV
MDC IDC STAT BRADY AP VS PERCENT: 1 %
MDC IDC STAT BRADY AS VP PERCENT: 93 %
MDC IDC STAT BRADY AS VS PERCENT: 1 %

## 2018-06-13 DIAGNOSIS — H524 Presbyopia: Secondary | ICD-10-CM | POA: Diagnosis not present

## 2018-06-13 DIAGNOSIS — E119 Type 2 diabetes mellitus without complications: Secondary | ICD-10-CM | POA: Diagnosis not present

## 2018-06-13 DIAGNOSIS — H5203 Hypermetropia, bilateral: Secondary | ICD-10-CM | POA: Diagnosis not present

## 2018-06-13 DIAGNOSIS — H26491 Other secondary cataract, right eye: Secondary | ICD-10-CM | POA: Diagnosis not present

## 2018-06-13 DIAGNOSIS — H52223 Regular astigmatism, bilateral: Secondary | ICD-10-CM | POA: Diagnosis not present

## 2018-06-14 ENCOUNTER — Telehealth: Payer: Self-pay | Admitting: Endocrinology

## 2018-06-14 NOTE — Telephone Encounter (Signed)
Patient is returning call. Please advise, thanks °

## 2018-06-14 NOTE — Telephone Encounter (Signed)
No one here called pt according to her chart

## 2018-06-15 ENCOUNTER — Encounter: Payer: Self-pay | Admitting: Family Medicine

## 2018-06-15 ENCOUNTER — Ambulatory Visit (INDEPENDENT_AMBULATORY_CARE_PROVIDER_SITE_OTHER): Payer: Medicare HMO | Admitting: Family Medicine

## 2018-06-15 VITALS — BP 140/80 | HR 74 | Temp 98.1°F | Wt 208.0 lb

## 2018-06-15 DIAGNOSIS — Z794 Long term (current) use of insulin: Secondary | ICD-10-CM

## 2018-06-15 DIAGNOSIS — E1140 Type 2 diabetes mellitus with diabetic neuropathy, unspecified: Secondary | ICD-10-CM

## 2018-06-15 DIAGNOSIS — I5022 Chronic systolic (congestive) heart failure: Secondary | ICD-10-CM | POA: Diagnosis not present

## 2018-06-15 DIAGNOSIS — Z23 Encounter for immunization: Secondary | ICD-10-CM

## 2018-06-15 NOTE — Patient Instructions (Signed)
Diabetic Neuropathy Diabetic neuropathy is a nerve disease or nerve damage that is caused by diabetes mellitus. About half of all people with diabetes mellitus have some form of nerve damage. Nerve damage is more common in those who have had diabetes mellitus for many years and who generally have not had good control of their blood sugar (glucose) level. Diabetic neuropathy is a common complication of diabetes mellitus. There are three common types of diabetic neuropathy and a fourth type that is less common and less understood:  Peripheral neuropathy-This is the most common type of diabetic neuropathy. It causes damage to the nerves of the feet and legs first and then eventually the hands and arms. The damage affects the ability to sense touch.  Autonomic neuropathy-This type causes damage to the autonomic nervous system, which controls the following functions: ? Heartbeat. ? Body temperature. ? Blood pressure. ? Urination. ? Digestion. ? Sweating. ? Sexual function.  Focal neuropathy-Focal neuropathy can be painful and unpredictable and occurs most often in older adults with diabetes mellitus. It involves a specific nerve or one area and often comes on suddenly. It usually does not cause long-term problems.  Radiculoplexus neuropathy- Sometimes called lumbosacral radiculoplexus neuropathy, radiculoplexus neuropathy affects the nerves of the thighs, hips, buttocks, or legs. It is more common in people with type 2 diabetes mellitus and in older men. It is characterized by debilitating pain, weakness, and atrophy, usually in the thigh muscles.  What are the causes? The cause of peripheral, autonomic, and focal neuropathies is diabetes mellitus that is uncontrolled and high glucose levels. The cause of radiculoplexus neuropathy is unknown. However, it is thought to be caused by inflammation related to uncontrolled glucose levels. What are the signs or symptoms? Peripheral Neuropathy Peripheral  neuropathy develops slowly over time. When the nerves of the feet and legs no longer work there may be:  Burning, stabbing, or aching pain in the legs or feet.  Inability to feel pressure or pain in your feet. This can lead to: ? Thick calluses over pressure areas. ? Pressure sores. ? Ulcers.  Foot deformities.  Reduced ability to feel temperature changes.  Muscle weakness.  Autonomic Neuropathy The symptoms of autonomic neuropathy vary depending on which nerves are affected. Symptoms may include:  Problems with digestion, such as: ? Feeling sick to your stomach (nausea). ? Vomiting. ? Bloating. ? Constipation. ? Diarrhea. ? Abdominal pain.  Difficulty with urination. This occurs if you lose your ability to sense when your bladder is full. Problems include: ? Urine leakage (incontinence). ? Inability to empty your bladder completely (retention).  Rapid or irregular heartbeat (palpitations).  Blood pressure drops when you stand up (orthostatic hypotension). When you stand up you may feel: ? Dizzy. ? Weak. ? Faint.  In men, inability to attain and maintain an erection.  In women, vaginal dryness and problems with decreased sexual desire and arousal.  Problems with body temperature regulation.  Increased or decreased sweating.  Focal Neuropathy  Abnormal eye movements or abnormal alignment of both eyes.  Weakness in the wrist.  Foot drop. This results in an inability to lift the foot properly and abnormal walking or foot movement.  Paralysis on one side of your face (Bell palsy).  Chest or abdominal pain. Radiculoplexus Neuropathy  Sudden, severe pain in your hip, thigh, or buttocks.  Weakness and wasting of thigh muscles.  Difficulty rising from a seated position.  Abdominal swelling.  Unexplained weight loss (usually more than 10 lb [4.5 kg]). How is   this diagnosed? Peripheral Neuropathy Your senses may be tested. Sensory function testing can be  done with:  A light touch using a monofilament.  A vibration with tuning fork.  A sharp sensation with a pin prick.  Other tests that can help diagnose neuropathy are:  Nerve conduction velocity. This test checks the transmission of an electrical current through a nerve.  Electromyography. This shows how muscles respond to electrical signals transmitted by nearby nerves.  Quantitative sensory testing. This is used to assess how your nerves respond to vibrations and changes in temperature.  Autonomic Neuropathy Diagnosis is often based on reported symptoms. Tell your health care provider if you experience:  Dizziness.  Constipation.  Diarrhea.  Inappropriate urination or inability to urinate.  Inability to get or maintain an erection.  Tests that may be done include:  Electrocardiography or Holter monitor. These are tests that can help show problems with the heart rate or heart rhythm.  An X-ray exam may be done.  Focal Neuropathy Diagnosis is made based on your symptoms and what your health care provider finds during your exam. Other tests may be done. They may include:  Nerve conduction velocities. This checks the transmission of electrical current through a nerve.  Electromyography. This shows how muscles respond to electrical signals transmitted by nearby nerves.  Quantitative sensory testing. This test is used to assess how your nerves respond to vibration and changes in temperature.  Radiculoplexus Neuropathy  Often the first thing is to eliminate any other issue or problems that might be the cause, as there is no standard test for diagnosis.  X-ray exam of your spine and lumbar region.  Spinal tap to rule out cancer.  MRI to rule out other lesions. How is this treated? Once nerve damage occurs, it cannot be reversed. The goal of treatment is to keep the disease or nerve damage from getting worse and affecting more nerve fibers. Controlling your blood  glucose level is the key. Most people with radiculoplexus neuropathy see at least a partial improvement over time. You will need to keep your blood glucose and HbA1c levels in the target range determined by your health care provider. Things that help control blood glucose levels include:  Blood glucose monitoring.  Meal planning.  Physical activity.  Diabetes medicine.  Over time, maintaining lower blood glucose levels helps lessen symptoms. Sometimes, prescription pain medicine is needed. Follow these instructions at home:  Do not smoke.  Keep your blood glucose level in the range that you and your health care provider have determined acceptable for you.  Keep your blood pressure level in the range that you and your health care provider have determined acceptable for you.  Eat a well-balanced diet.  Be physically active every day. Include strength training and balance exercises.  Protect your feet. ? Check your feet every day for sores, cuts, blisters, or signs of infection. ? Wear padded socks and supportive shoes. Use orthotic inserts, if necessary. ? Regularly check the insides of your shoes for worn spots. Make sure there are no rocks or other items inside your shoes before you put them on. Contact a health care provider if:  You have burning, stabbing, or aching pain in the legs or feet.  You are unable to feel pressure or pain in your feet.  You develop problems with digestion such as: ? Nausea. ? Vomiting. ? Bloating. ? Constipation. ? Diarrhea. ? Abdominal pain.  You have difficulty with urination, such as: ? Incontinence. ? Retention.  You have palpitations.  You develop orthostatic hypotension. When you stand up you may feel: ? Dizzy. ? Weak. ? Faint.  You cannot attain and maintain an erection (in men).  You have vaginal dryness and problems with decreased sexual desire and arousal (in women).  You have severe pain in your thighs, legs, or  buttocks.  You have unexplained weight loss. This information is not intended to replace advice given to you by your health care provider. Make sure you discuss any questions you have with your health care provider. Document Released: 11/08/2001 Document Revised: 02/05/2016 Document Reviewed: 02/08/2013 Elsevier Interactive Patient Education  2017 [employer].  Heart Failure and Exercise Heart failure is a condition in which the heart does not fill or pump enough blood and oxygen to support your body and its functions. Heart failure is a long-term (chronic) condition. Living with heart failure can be challenging. However, following your health care provider's instructions about a healthy lifestyle may help improve your symptoms. This includes choosing the right exercise plan. Doing daily physical activity is important after a diagnosis of heart failure. You may have some activity restrictions, so talk to your health care provider before doing any exercises. What are the benefits of exercise? Exercise may:  Make your heart muscles stronger.  Lower your blood pressure.  Lower your cholesterol.  Help you lose weight.  Help your bones stay strong.  Improve your blood circulation.  Help your body use oxygen better. This relieves symptoms such as fatigue and shortness of breath.  Help your mental health by lowering the risk of depression and other problems.  Improve your quality of life.  Decrease your chance of hospital admission for heart failure.  What is an exercise plan? An exercise plan is a set of specific exercises and training activities. You will work with your health care provider to create the exercise plan that works for you. The plan may include:  Different types of exercises and how to do them.  Cardiac rehabilitation exercises. These are supervised programs that are designed to strengthen your heart.  What are strengthening exercises? Strengthening exercises are a  type of physical activity that involves using resistance to improve your muscle strength. Strengthening exercises usually have repetitive motions. These types of exercises can include:  Lifting weights.  Using weight machines.  Using resistance tubes and bands.  Using kettlebells.  Using your body weight, such as doing push-ups or squats.  What are balance exercises? Balance exercises are another type of physical activity. They strengthen the muscles of the back, stomach, and pelvis (core muscles) and improve your balance. They can also lower your risk of falling. These types of exercises can include:  Standing on one leg.  Walking backward, sideways, and in a straight line.  Standing up after sitting, without using your hands.  Shifting your weight from one leg to the other.  Lifting one leg in front of you.  Doing tai chi. This is a type of exercise that uses slow movements and deep breathing.  How can I increase my flexibility? Having better flexibility can keep you from falling. It can also lengthen your muscles, improve your range of motion, and help your joints. You can increase your flexibility by:  Doing tai chi.  Doing yoga.  Stretching.  How much aerobic exercise should I get? Aerobic exercises strengthen your breathing and circulation system and increase your body's use of oxygen. Examples of aerobic exercise include biking, walking, running, and swimming. Talk to your  health care provider to find out how much aerobic exercise is safe for you.  To do these exercises:  Start exercising slowly, limiting the amount of time at first. You may need to start with 5 minutes of aerobic exercise every day.  Slowly add more minutes until you can safely do at least 30 minutes of exercise at least 4 days a week.  Summary  Daily physical activity is important after a diagnosis of heart failure.  Exercise can make your heart muscles stronger. It also offers other benefits  that will improve your health.  Talk to your health care provider before doing any exercises. This information is not intended to replace advice given to you by your health care provider. Make sure you discuss any questions you have with your health care provider. Document Released: 01/11/2017 Document Revised: 01/11/2017 Document Reviewed: 01/11/2017 Elsevier Interactive Patient Education  2018 [employer].

## 2018-06-15 NOTE — Progress Notes (Signed)
Subjective:    Patient ID: Laurie [name], female    DOB: 04-25-1946, 72 y.o.   MRN: [id number]  No chief complaint on file.   HPI Patient was seen today for follow-up on chronic conditions and TOC, previously seen by Dr. Raliegh Ip.  DM type II with neuropathy: -Followed by endocrinology, Dr. Loanne Drilling -pt endorses insulin resistance -On Novolin R 222 units in a.m., 220 units at lunch, 140 units in evening. -Last hemoglobin A1c 8.6% 05/08/2018 -Neuropathy becoming worse.  Not currently on medication for burning sensation -Burning in hands will wake pt up at night  CHF: -Followed by cardiology, Dr. Wynonia Lawman -Pt has pacer in place -Also on diuretics.  Lasix 80 mg every morning -Pt does not like Lasix 2/2 frequent urination, now wearing depends. -Limit sodium intake  Past Medical History:  Diagnosis Date  . AICD (automatic cardioverter/defibrillator) present   . Bell's palsy 01/05/2010  . CAD (coronary artery disease)    Cath  07/21/1999  mild ostial, 80% stenosis proximal LAD, 90% stenosis proximal Diag 1, 95% stenosis proximal OM 1, 80% stenosis proximal RCA, 95% stenosis mid RCA    CABG 07/22/99  LIMA to LAD, SVG to dx, SVG to OM, SVG to PDA  Dr. [name]   Cardiolite 2012 no ischemia EF 67%  Cath 10/23/12 Severe native three-vessel coronary artery disease with occlusion of all 3 native coronary arteries, Patent sap  . Chronic lower back pain   . Chronic systolic CHF (congestive heart failure) ([place]) 10/22/2015  . Cystitis   . Hepatitis 1970s   "don't know which" (03/17/2017)  . HIstory of Bell's palsy    October 2012   . Hyperlipidemia    Intolerance to several statins   . Hypertensive heart disease without CHF   . Multinodular goiter   . Obesity (BMI 30-39.9)   . OSA on CPAP    "suppose to wear a mask" (03/17/2017)  . Peripheral neuropathy   . Pneumonia 1990s X 1   "think i had walking pneumonia"  . Seasonal allergies   . Stroke ([place]) 03/2017   "light one"; denies residual on 03/17/2017)  .  Type II diabetes mellitus (HCC)     Allergies  Allergen Reactions  . Statins Other (See Comments)    Statins make the legs ache  . Tricor [Fenofibrate] Other (See Comments)    Leg pain  . Latex Itching and Other (See Comments)    Affected areas turn red, also-  . Tape Rash    Heart leads break out the skin    ROS General: Denies fever, chills, night sweats, changes in weight, changes in appetite HEENT: Denies headaches, ear pain, changes in vision, rhinorrhea, sore throat CV: Denies CP, palpitations, SOB, orthopnea Pulm: Denies SOB, cough, wheezing GI: Denies abdominal pain, nausea, vomiting, diarrhea, constipation GU: Denies dysuria, hematuria, frequency, vaginal discharge Msk: Denies muscle cramps, joint pains Neuro: Denies weakness  + burning, numbness, tingling in extremities Skin: Denies rashes, bruising Psych: Denies depression, anxiety, hallucinations     Objective:    Blood pressure 140/80, pulse 74, temperature 98.1 F (36.7 C), temperature source Oral, weight 208 lb (94.3 kg), SpO2 96 %.   Gen. Pleasant, well-nourished, in no distress, normal affect   HEENT: [place]/AT, face symmetric, no scleral icterus, PERRLA,  nares patent without drainage Lungs: no accessory muscle use, CTAB, no wheezes or rales Cardiovascular: RRR, no m/r/g, no peripheral edema Neuro:  A&Ox3, CN II-XII intact, normal gait  Wt Readings from Last 3 Encounters:  06/15/18  208 lb (94.3 kg)  05/08/18 203 lb (92.1 kg)  03/06/18 203 lb 3.2 oz (92.2 kg)    Lab Results  Component Value Date   WBC 9.6 10/29/2017   HGB 13.4 10/29/2017   HCT 40.3 10/29/2017   PLT 226 10/29/2017   GLUCOSE 127 (H) 10/29/2017   CHOL 169 10/28/2017   TRIG 178 (H) 10/28/2017   HDL 37 (L) 10/28/2017   LDLDIRECT 155.1 06/05/2012   LDLCALC 96 10/28/2017   ALT 32 10/28/2017   AST 37 10/28/2017   NA 140 10/29/2017   K 4.2 10/29/2017   CL 107 10/29/2017   CREATININE 0.97 10/29/2017   BUN 17 10/29/2017   CO2 21 (L)  10/29/2017   TSH 0.876 10/28/2017   INR 0.99 10/28/2017   HGBA1C 8.6 (A) 05/08/2018   MICROALBUR 0.2 09/16/2014    Assessment/Plan:  Chronic systolic CHF (congestive heart failure) (HCC) -Discussed importance of continuing to limit sodium intake -Continue Lasix 80 mg daily -Continue follow-up with Dr. Wynonia Lawman, cardiology.  Next appointment in a few weeks  Type 2 diabetes mellitus with diabetic neuropathy, with long-term current use of insulin (HCC) -Continue Novolin R 222, 220, 140 units daily -Continue lifestyle modifications -Continue follow-up with Dr. Loanne Drilling in the next few weeks -encouraged to discuss neuropathy at that appointment.  Influenza vaccine needed -Influenza vaccine given this visit  Follow-up in the next few months, sooner if needed  Grier Mitts, MD

## 2018-06-16 ENCOUNTER — Other Ambulatory Visit: Payer: Self-pay | Admitting: Physician Assistant

## 2018-06-23 ENCOUNTER — Encounter: Payer: Self-pay | Admitting: Internal Medicine

## 2018-07-10 ENCOUNTER — Ambulatory Visit: Payer: Medicare HMO | Admitting: Endocrinology

## 2018-07-10 ENCOUNTER — Encounter: Payer: Self-pay | Admitting: Endocrinology

## 2018-07-10 VITALS — BP 140/78 | HR 85 | Ht 62.0 in | Wt 207.0 lb

## 2018-07-10 DIAGNOSIS — E1140 Type 2 diabetes mellitus with diabetic neuropathy, unspecified: Secondary | ICD-10-CM

## 2018-07-10 DIAGNOSIS — E0859 Diabetes mellitus due to underlying condition with other circulatory complications: Secondary | ICD-10-CM

## 2018-07-10 DIAGNOSIS — Z794 Long term (current) use of insulin: Secondary | ICD-10-CM | POA: Diagnosis not present

## 2018-07-10 DIAGNOSIS — E042 Nontoxic multinodular goiter: Secondary | ICD-10-CM

## 2018-07-10 LAB — POCT GLYCOSYLATED HEMOGLOBIN (HGB A1C): Hemoglobin A1C: 7.3 % — AB (ref 4.0–5.6)

## 2018-07-10 MED ORDER — INSULIN REGULAR HUMAN 100 UNIT/ML IJ SOLN: INTRAMUSCULAR | 5 refills | Status: DC

## 2018-07-10 NOTE — Progress Notes (Signed)
Subjective:    Patient ID: Laurie [name], female    DOB: 08/04/1946, 72 y.o.   MRN: [id number]  HPI Pt returns for f/u of diabetes mellitus:   DM type: insulin-requiring type 2 Dx'ed: 5170 Complications: polyneuropathy, CVA, and CAD.   Therapy: insulin since 2011.   GDM: 1985.   DKA: never Severe hypoglycemia: never.   Pancreatitis: never.   Other: she has severe insulin resistance; she required very little insulin during a hospitalization in early 2014; she wants to delay weight-loss surgery for now; she takes human insulin, due to cost; she has long duration of action of insulin, and based on pattern of cbg's, does not need basal insulin.    Interval history: no cbg record, but states cbg's vary from 48-300's.  It is still lowest fasting.  she has mild hypoglycemia approx twice a week.   Pt also has multinodular goiter (dx'ed 2011; bx then showed HYPERPLASTIC LESION; she has been euthyroid; f/u US in late 2018 was unchanged, and no f/u was advised).   Past Medical History:  Diagnosis Date  . AICD (automatic cardioverter/defibrillator) present   . Bell's palsy 01/05/2010  . CAD (coronary artery disease)    Cath  07/21/1999  mild ostial, 80% stenosis proximal LAD, 90% stenosis proximal Diag 1, 95% stenosis proximal OM 1, 80% stenosis proximal RCA, 95% stenosis mid RCA    CABG 07/22/99  LIMA to LAD, SVG to dx, SVG to OM, SVG to PDA  Dr. [name]   Cardiolite 2012 no ischemia EF 67%  Cath 10/23/12 Severe native three-vessel coronary artery disease with occlusion of all 3 native coronary arteries, Patent sap  . Chronic lower back pain   . Chronic systolic CHF (congestive heart failure) ([place]) 10/22/2015  . Cystitis   . Hepatitis 1970s   "don't know which" (03/17/2017)  . HIstory of Bell's palsy    October 2012   . Hyperlipidemia    Intolerance to several statins   . Hypertensive heart disease without CHF   . Multinodular goiter   . Obesity (BMI 30-39.9)   . OSA on CPAP    "suppose to wear  a mask" (03/17/2017)  . Peripheral neuropathy   . Pneumonia 1990s X 1   "think i had walking pneumonia"  . Seasonal allergies   . Stroke ([place]) 03/2017   "light one"; denies residual on 03/17/2017)  . Type II diabetes mellitus ([place])     Past Surgical History:  Procedure Laterality Date  . BI-VENTRICULAR PACEMAKER UPGRADE  11/05/2015   "upgraded my pacemaker"  . BIOPSY THYROID  05/2010   percutaneous  . CARDIAC CATHETERIZATION N/A 10/21/2015   Procedure: Left Heart Cath and Cors/Grafts Angiography;  Surgeon: Belva Crome, MD;  Location: [place] [facility];  Service: Cardiovascular;  Laterality: N/A;  . CARPAL TUNNEL RELEASE Bilateral   . CATARACT EXTRACTION Right 01/06/2015  . CATARACT EXTRACTION W/ INTRAOCULAR LENS IMPLANT Left 12/16/2014  . CORONARY ARTERY BYPASS GRAFT  2000  . CORONARY STENT INTERVENTION N/A 10/28/2017   Procedure: CORONARY STENT INTERVENTION;  Surgeon: Jettie Booze, MD;  Location: [place] [facility];  Service: Cardiovascular;  Laterality: N/A;  . CYSTECTOMY Right    "middle finger"  . DENTAL IMPLANTS    . EP IMPLANTABLE DEVICE N/A 11/05/2015   Procedure: BiV Upgrade;  Surgeon: Deboraha Sprang, MD;  Location: [place] [facility];  Service: Cardiovascular;  Laterality: N/A;  . INSERT / REPLACE / REMOVE PACEMAKER    . LEAD REVISION  N/A 10/25/2012   Procedure: LEAD REVISION;  Surgeon: Evans Lance, MD;  Location: [facility] CATH LAB;  Service: Cardiovascular;  Laterality: N/A;  . LEFT HEART CATH AND CORS/GRAFTS ANGIOGRAPHY N/A 10/28/2017   Procedure: LEFT HEART CATH AND CORS/GRAFTS ANGIOGRAPHY;  Surgeon: Jettie Booze, MD;  Location: [place] [facility];  Service: Cardiovascular;  Laterality: N/A;  . LEFT HEART CATHETERIZATION WITH CORONARY ANGIOGRAM N/A 10/23/2012   Procedure: LEFT HEART CATHETERIZATION WITH CORONARY ANGIOGRAM;  Surgeon: Jacolyn Reedy, MD;  Location: [facility] CATH LAB;  Service: Cardiovascular;  Laterality: N/A;  . PERMANENT PACEMAKER INSERTION N/A 10/23/2012     Procedure: PERMANENT PACEMAKER INSERTION;  Surgeon: Deboraha Sprang, MD;  Location: [facility] CATH LAB;  Service: Cardiovascular;  Laterality: N/A;  . PLANTAR FASCIA RELEASE Left    "& clipped a tendon that went thru bottom of my foot"  . TEMPORARY PACEMAKER INSERTION N/A 10/22/2012   Procedure: TEMPORARY PACEMAKER INSERTION;  Surgeon: Troy Sine, MD;  Location: [facility] CATH LAB;  Service: Cardiovascular;  Laterality: N/A;  . TUBAL LIGATION      Social History   Socioeconomic History  . Marital status: Married    Spouse name: Not on file  . Number of children: 2  . Years of education: Not on file  . Highest education level: Not on file  Occupational History    Employer: RETIRED  Social Needs  . Financial resource strain: Not on file  . Food insecurity:    Worry: Not on file    Inability: Not on file  . Transportation needs:    Medical: Not on file    Non-medical: Not on file  Tobacco Use  . Smoking status: Never Smoker  . Smokeless tobacco: Never Used  Substance and Sexual Activity  . Alcohol use: No    Alcohol/week: 0.0 standard drinks  . Drug use: No  . Sexual activity: Never  Lifestyle  . Physical activity:    Days per week: Not on file    Minutes per session: Not on file  . Stress: Not on file  Relationships  . Social connections:    Talks on phone: Not on file    Gets together: Not on file    Attends religious service: Not on file    Active member of club or organization: Not on file    Attends meetings of clubs or organizations: Not on file    Relationship status: Not on file  . Intimate partner violence:    Fear of current or ex partner: Not on file    Emotionally abused: Not on file    Physically abused: Not on file    Forced sexual activity: Not on file  Other Topics Concern  . Not on file  Social History Narrative   Does not work outside the home    Current Outpatient Medications on File Prior to Visit  Medication Sig Dispense Refill  . ACCU-CHEK FASTCLIX  LANCETS MISC USE  TO CHECK BLOOD SUGAR FOUR TIMES DAILY 408 each 2  . ACCU-CHEK SMARTVIEW test strip USE  TO CHECK BLOOD SUGAR FOUR TIMES DAILY 400 each 1  . Alcohol Swabs (B-D SINGLE USE SWABS REGULAR) PADS Use 8 daily for testing blood sugar and insulin injections. 800 each 1  . aspirin 81 MG tablet Take 1 tablet (81 mg total) by mouth daily. 30 tablet 11  . beta carotene w/minerals (OCUVITE) tablet Take 1 tablet by mouth daily.    . Blood Glucose Calibration (ACCU-CHEK SMARTVIEW CONTROL)  LIQD Use to calibrate blood glucose meter. 3 each 1  . cetirizine (ZYRTEC) 10 MG tablet Take 1 tablet (10 mg total) by mouth daily.    . cholecalciferol (VITAMIN D) 1000 UNITS tablet Take 1,000 Units by mouth daily.     . clopidogrel (PLAVIX) 75 MG tablet Take 1 tablet (75 mg total) by mouth daily. Please keep upcoming appt with Dr. [name] in October before anymore refills. Thank you 30 tablet 0  . Continuous Blood Gluc Sensor (FREESTYLE LIBRE 14 DAY SENSOR) MISC 1 Device by Does not apply route every 14 (fourteen) days. 6 each 3  . furosemide (LASIX) 40 MG tablet TAKE 2 TABLETS BY MOUTH EVERY MORNING AND 1 TABLET BY MOUTH EVERY EVENING.TAKE 1 ADDITIONAL TABLET BY MOUTH FOR WEIGHT GAIN OF 3 LBS OR MORE 120 tablet 0  . GLUCOSAMINE-CHONDROITIN PO Take 1 tablet by mouth daily.    . Insulin Syringe-Needle U-100 (BD VEO INSULIN SYR ULTRAFINE) 31G X 15/64" 1 ML MISC USE 10 TIMES A DAY WITH INSULIN AS DIRECTED 900 each 0  . ketoconazole (NIZORAL) 2 % cream Apply 1 application topically daily as needed for irritation. TO AFFECTED AREAS OF FACE     . Multiple Vitamins-Minerals (MULTIVITAMIN WITH MINERALS) tablet Take 1 tablet by mouth daily.    . nitroGLYCERIN (NITROSTAT) 0.4 MG SL tablet Place 0.4 mg under the tongue as needed for chest pain.     . potassium chloride SA (KLOR-CON M20) 20 MEQ tablet Take 1 tablet (20 mEq total) by mouth daily. 180 tablet 2  . pravastatin (PRAVACHOL) 20 MG tablet TAKE 1 TABLET BY MOUTH ONCE  DAILY 6 IN THE EVENING 90 tablet 6  . Probiotic Product (PROBIOTIC DAILY) CAPS Take 1 capsule by mouth daily.    . psyllium (METAMUCIL) 58.6 % powder Take 1 packet by mouth daily as needed (fiber).     . vitamin C (ASCORBIC ACID) 500 MG tablet Take 500 mg by mouth every other day.     No current facility-administered medications on file prior to visit.     Allergies  Allergen Reactions  . Statins Other (See Comments)    Statins make the legs ache  . Tricor [Fenofibrate] Other (See Comments)    Leg pain  . Latex Itching and Other (See Comments)    Affected areas turn red, also-  . Tape Rash    Heart leads break out the skin    Family History  Problem Relation Age of Onset  . Diabetes Father   . Heart disease Father   . Diabetes Mother   . Diabetes Other        Siblings and 2 children  . Heart disease Other        CAD  . Colon cancer Neg Hx   . Colon polyps Neg Hx     BP 140/78   Pulse 85   Ht 5\' 2"  (1.575 m)   Wt 207 lb (93.9 kg)   SpO2 95%   BMI 37.86 kg/m    Review of Systems Denies LOC.      Objective:   Physical Exam VITAL SIGNS:  See vs page GENERAL: no distress Pulses: dorsalis pedis intact bilat.  Skin: no ulcer on the feet. feet are of normal color and temp. There is a healed vein harvest scar at the right leg.  Feet: no deformity.  CV: trace bilat leg edema.  Neuro: sensation is intact to touch on the feet,but decreased from normal.    Lab Results  Component Value Date   TSH 0.876 10/28/2017    A1c=7.3%    Assessment & Plan:  Insulin-requiring type 2 DM, with CAD.: this is the best control this pt should aim for, given this regimen, which does match insulin to her changing needs throughout the day Hypoglycemia: this is limiting aggressiveness of glycemic control. MNG: recheck TFT today.   Patient Instructions  check your blood sugar 4 times a day--before the 3 meals, and at bedtime (the bedtime reading is important, as we may need to  increase the suppertime insulin).  also check if you have symptoms of your blood sugar being too high or too low.  please keep a record of the readings and bring it to your next appointment here.  please call us sooner if you are having low blood sugar episodes.  Please reduce the insulin to 220 at breakfast, 220 at lunch, and 130 at dinner.   Eat a light snack at bedtime, to avoid the sugar going low in the morning.   Please come back for a follow-up appointment in 3 months.

## 2018-07-10 NOTE — Patient Instructions (Signed)
check your blood sugar 4 times a day--before the 3 meals, and at bedtime (the bedtime reading is important, as we may need to increase the suppertime insulin).  also check if you have symptoms of your blood sugar being too high or too low.  please keep a record of the readings and bring it to your next appointment here.  please call us sooner if you are having low blood sugar episodes.  Please reduce the insulin to 220 at breakfast, 220 at lunch, and 130 at dinner.   Eat a light snack at bedtime, to avoid the sugar going low in the morning.   Please come back for a follow-up appointment in 3 months.

## 2018-07-11 LAB — T4, FREE: Free T4: 0.93 ng/dL (ref 0.60–1.60)

## 2018-07-11 LAB — TSH: TSH: 1.24 u[IU]/mL (ref 0.35–4.50)

## 2018-07-12 ENCOUNTER — Encounter: Payer: Self-pay | Admitting: Internal Medicine

## 2018-07-12 ENCOUNTER — Ambulatory Visit: Payer: Medicare HMO | Admitting: Internal Medicine

## 2018-07-12 VITALS — BP 148/80 | HR 71 | Ht 62.0 in | Wt 207.0 lb

## 2018-07-12 DIAGNOSIS — I428 Other cardiomyopathies: Secondary | ICD-10-CM

## 2018-07-12 DIAGNOSIS — I5022 Chronic systolic (congestive) heart failure: Secondary | ICD-10-CM

## 2018-07-12 DIAGNOSIS — Z950 Presence of cardiac pacemaker: Secondary | ICD-10-CM

## 2018-07-12 DIAGNOSIS — I442 Atrioventricular block, complete: Secondary | ICD-10-CM

## 2018-07-12 DIAGNOSIS — I429 Cardiomyopathy, unspecified: Secondary | ICD-10-CM | POA: Diagnosis not present

## 2018-07-12 LAB — CUP PACEART INCLINIC DEVICE CHECK
Battery Remaining Longevity: 55 mo
Brady Statistic RA Percent Paced: 6.2 %
Date Time Interrogation Session: 20191030160032
HighPow Impedance: 65.25 Ohm
Implantable Lead Implant Date: 20170222
Implantable Lead Implant Date: 20170222
Implantable Lead Location: 753858
Implantable Lead Location: 753859
Implantable Lead Location: 753860
Implantable Lead Model: 4298
Implantable Lead Model: 5076
Lead Channel Impedance Value: 400 Ohm
Lead Channel Impedance Value: 462.5 Ohm
Lead Channel Pacing Threshold Amplitude: 0.5 V
Lead Channel Pacing Threshold Amplitude: 0.875 V
Lead Channel Pacing Threshold Pulse Width: 0.5 ms
Lead Channel Sensing Intrinsic Amplitude: 3.4 mV
Lead Channel Setting Pacing Amplitude: 2 V
Lead Channel Setting Pacing Amplitude: 2 V
Lead Channel Setting Pacing Amplitude: 2 V
Lead Channel Setting Pacing Pulse Width: 0.5 ms
Lead Channel Setting Pacing Pulse Width: 0.5 ms
MDC IDC LEAD IMPLANT DT: 20140210
MDC IDC MSMT LEADCHNL LV PACING THRESHOLD AMPLITUDE: 0.75 V
MDC IDC MSMT LEADCHNL LV PACING THRESHOLD PULSEWIDTH: 0.5 ms
MDC IDC MSMT LEADCHNL RA IMPEDANCE VALUE: 350 Ohm
MDC IDC MSMT LEADCHNL RA PACING THRESHOLD PULSEWIDTH: 0.5 ms
MDC IDC PG IMPLANT DT: 20170222
MDC IDC PG SERIAL: 7341405
MDC IDC SET LEADCHNL RV SENSING SENSITIVITY: 0.5 mV
MDC IDC STAT BRADY RV PERCENT PACED: 99.4 %

## 2018-07-12 NOTE — Patient Instructions (Addendum)
Medication Instructions:  Your physician recommends that you continue on your current medications as directed. Please refer to the Current Medication list given to you today.  If you need a refill on your cardiac medications before your next appointment, please call your pharmacy.   Lab work: Today: BMP  If you have labs (blood work) drawn today and your tests are completely normal, you will receive your results only by: [name] MyChart Message (if you have MyChart) OR . A paper copy in the mail If you have any lab test that is abnormal or we need to change your treatment, we will call you to review the results.  Testing/Procedures: None ordered   Follow-Up: Remote monitoring is used to monitor your Pacemaker from home. This monitoring reduces the number of office visits required to check your device to one time per year. It allows [place] to keep an eye on the functioning of your device to ensure it is working properly. You are scheduled for a device check from home on 08/08/2018. You may send your transmission at any time that day. If you have a wireless device, the transmission will be sent automatically. After your physician reviews your transmission, you will receive a postcard with your next transmission date.  Your physician wants you to follow-up in: 6 months with Tommye Standard, PA. You will receive a reminder letter in the mail two months in advance. If you don't receive a letter, please call our office to schedule the follow-up appointment.  At [facility], you and your health needs are our priority.  As part of our continuing mission to provide you with exceptional heart care, we have created designated Provider Care Teams.  These Care Teams include your primary Cardiologist (physician) and Advanced Practice Providers (APPs -  Physician Assistants and Nurse Practitioners) who all work together to provide you with the care you need, when you need it. You will need a follow up appointment in 6  months.  Please call our office 2 months in advance to schedule this appointment.  You may see Dr. [name] or one of the following Advanced Practice Providers on your designated Care Team:   Chanetta Marshall, NP . Tommye Standard, PA-C  Thank you for choosing [facility]!!     Any Other Special Instructions Will Be Listed Below (If Applicable). Decrease your fluid intake, cut it back by 25% of what you are in taking now.

## 2018-07-12 NOTE — Addendum Note (Signed)
Addended by: Eulis Foster on: 07/12/2018 02:51 PM   Modules accepted: Orders

## 2018-07-12 NOTE — Progress Notes (Signed)
Patient Care Team: Billie Ruddy, MD as PCP - General (Family Medicine) Jacolyn Reedy, MD as PCP - Cardiology (Cardiology)   HPI  Laurie [name] is a 72 y.o. female Seen in followup for pacemaker implantation 2/14 for intermittent complete heart block and syncope.  With CRT upgrade 2/17.  she has atrial fibrillation.  She has some degree of sinus tachycardia with her mean heart rate right at 100 beats per minute;    she has been intolerant of beta blockers    Hospitalized for congestive heart failure was noted to have progressive left ventricular dysfunction and acute on chronic heart failure. The issue was raised as to whether this was pacemaker-induced cardiomyopathy as her ejection fraction  had gone to 30-35% with the evolution of 100% ventricular pacing whereas with no ventricular pacing in 7/15 LV function was normal. 2/17 she underwent CRT upgrade.  She has a history of coronary artery disease with prior CABG. Hospitalized 2/19 with unstable angina.  Underwent catheterization demonstrating fresh occlusion of her marginal graft and high-grade disease of her diagonal.  Both were stented.  She is also had problems with a stroke and has had difficulty with balance ever since.  DATE TEST EF   7/15 Echo   65 %   2/17 `LHC 35% LIMA; SVG>OM,D patent SVG RCA T  2/17 Echo   35 %   7/17 Echo  40.-45%   2/19 LHC 45% LIMA- LAD Patent; SVG D, OM Stentx 2 SVG RCA T  3*19 Echo  45%     Date Cr K  Hgb  2/19 0.97 4.2 13.4         Issues with volume overload and dyspnea on exertion.  Fluid intake is voluminous.  Try to keep herself from getting dehydrated.     Thromboembolic risk factors ( age -39, HTN-1, TIA/CVA-2 , DM-1, Vasc disease -1, CHF-1, Gender-1) for a CHADSVASc Score of 7-9  Past Medical History:  Diagnosis Date  . AICD (automatic cardioverter/defibrillator) present   . Bell's palsy 01/05/2010  . CAD (coronary artery disease)    Cath  07/21/1999  mild ostial,  80% stenosis proximal LAD, 90% stenosis proximal Diag 1, 95% stenosis proximal OM 1, 80% stenosis proximal RCA, 95% stenosis mid RCA    CABG 07/22/99  LIMA to LAD, SVG to dx, SVG to OM, SVG to PDA  Dr. [name]   Cardiolite 2012 no ischemia EF 67%  Cath 10/23/12 Severe native three-vessel coronary artery disease with occlusion of all 3 native coronary arteries, Patent sap  . Chronic lower back pain   . Chronic systolic CHF (congestive heart failure) ([place]) 10/22/2015  . Cystitis   . Hepatitis 1970s   "don't know which" (03/17/2017)  . HIstory of Bell's palsy    October 2012   . Hyperlipidemia    Intolerance to several statins   . Hypertensive heart disease without CHF   . Multinodular goiter   . Obesity (BMI 30-39.9)   . OSA on CPAP    "suppose to wear a mask" (03/17/2017)  . Peripheral neuropathy   . Pneumonia 1990s X 1   "think i had walking pneumonia"  . Seasonal allergies   . Stroke ([place]) 03/2017   "light one"; denies residual on 03/17/2017)  . Type II diabetes mellitus ([place])     Past Surgical History:  Procedure Laterality Date  . BI-VENTRICULAR PACEMAKER UPGRADE  11/05/2015   "upgraded my pacemaker"  . BIOPSY THYROID  05/2010  percutaneous  . CARDIAC CATHETERIZATION N/A 10/21/2015   Procedure: Left Heart Cath and Cors/Grafts Angiography;  Surgeon: Belva Crome, MD;  Location: [place] [facility];  Service: Cardiovascular;  Laterality: N/A;  . CARPAL TUNNEL RELEASE Bilateral   . CATARACT EXTRACTION Right 01/06/2015  . CATARACT EXTRACTION W/ INTRAOCULAR LENS IMPLANT Left 12/16/2014  . CORONARY ARTERY BYPASS GRAFT  2000  . CORONARY STENT INTERVENTION N/A 10/28/2017   Procedure: CORONARY STENT INTERVENTION;  Surgeon: Jettie Booze, MD;  Location: [place] [facility];  Service: Cardiovascular;  Laterality: N/A;  . CYSTECTOMY Right    "middle finger"  . DENTAL IMPLANTS    . EP IMPLANTABLE DEVICE N/A 11/05/2015   Procedure: BiV Upgrade;  Surgeon: Deboraha Sprang, MD;  Location: [place]  [facility];  Service: Cardiovascular;  Laterality: N/A;  . INSERT / REPLACE / REMOVE PACEMAKER    . LEAD REVISION N/A 10/25/2012   Procedure: LEAD REVISION;  Surgeon: Evans Lance, MD;  [facility];  Service: Cardiovascular;  Laterality: N/A;  . LEFT HEART CATH AND CORS/GRAFTS ANGIOGRAPHY N/A 10/28/2017   Procedure: LEFT HEART CATH AND CORS/GRAFTS ANGIOGRAPHY;  Surgeon: Jettie Booze, MD;  Location: [place] [facility];  Service: Cardiovascular;  Laterality: N/A;  . LEFT HEART CATHETERIZATION WITH CORONARY ANGIOGRAM N/A 10/23/2012   Procedure: LEFT HEART CATHETERIZATION WITH CORONARY ANGIOGRAM;  Surgeon: Jacolyn Reedy, MD;  Location: [facility] CATH LAB;  Service: Cardiovascular;  Laterality: N/A;  . PERMANENT PACEMAKER INSERTION N/A 10/23/2012   Procedure: PERMANENT PACEMAKER INSERTION;  Surgeon: Deboraha Sprang, MD;  Location: [facility] CATH LAB;  Service: Cardiovascular;  Laterality: N/A;  . PLANTAR FASCIA RELEASE Left    "& clipped a tendon that went thru bottom of my foot"  . TEMPORARY PACEMAKER INSERTION N/A 10/22/2012   Procedure: TEMPORARY PACEMAKER INSERTION;  Surgeon: Troy Sine, MD;  Location: [facility] CATH LAB;  Service: Cardiovascular;  Laterality: N/A;  . TUBAL LIGATION      Current Outpatient Medications  Medication Sig Dispense Refill  . ACCU-CHEK FASTCLIX LANCETS MISC USE  TO CHECK BLOOD SUGAR FOUR TIMES DAILY 408 each 2  . ACCU-CHEK SMARTVIEW test strip USE  TO CHECK BLOOD SUGAR FOUR TIMES DAILY 400 each 1  . Alcohol Swabs (B-D SINGLE USE SWABS REGULAR) PADS Use 8 daily for testing blood sugar and insulin injections. 800 each 1  . aspirin 81 MG tablet Take 1 tablet (81 mg total) by mouth daily. 30 tablet 11  . beta carotene w/minerals (OCUVITE) tablet Take 1 tablet by mouth daily.    . Blood Glucose Calibration (ACCU-CHEK SMARTVIEW CONTROL) LIQD Use to calibrate blood glucose meter. 3 each 1  . cetirizine (ZYRTEC) 10 MG tablet Take 1 tablet (10 mg total) by mouth daily.    .  cholecalciferol (VITAMIN D) 1000 UNITS tablet Take 1,000 Units by mouth daily.     . clopidogrel (PLAVIX) 75 MG tablet Take 1 tablet (75 mg total) by mouth daily. Please keep upcoming appt with Dr. [name] in October before anymore refills. Thank you 30 tablet 0  . Continuous Blood Gluc Sensor (FREESTYLE LIBRE 14 DAY SENSOR) MISC 1 Device by Does not apply route every 14 (fourteen) days. 6 each 3  . furosemide (LASIX) 40 MG tablet TAKE 2 TABLETS BY MOUTH EVERY MORNING AND 1 TABLET BY MOUTH EVERY EVENING.TAKE 1 ADDITIONAL TABLET BY MOUTH FOR WEIGHT GAIN OF 3 LBS OR MORE 120 tablet 0  . GLUCOSAMINE-CHONDROITIN PO Take 1 tablet by  mouth daily.    . insulin regular (NOVOLIN R RELION) 100 units/mL injection 3 times a day (just before each meal) 220-220-130 units 200 mL 5  . Insulin Syringe-Needle U-100 (BD VEO INSULIN SYR ULTRAFINE) 31G X 15/64" 1 ML MISC USE 10 TIMES A DAY WITH INSULIN AS DIRECTED 900 each 0  . ketoconazole (NIZORAL) 2 % cream Apply 1 application topically daily as needed for irritation. TO AFFECTED AREAS OF FACE     . Multiple Vitamins-Minerals (MULTIVITAMIN WITH MINERALS) tablet Take 1 tablet by mouth daily.    . nitroGLYCERIN (NITROSTAT) 0.4 MG SL tablet Place 0.4 mg under the tongue as needed for chest pain.     . potassium chloride SA (KLOR-CON M20) 20 MEQ tablet Take 1 tablet (20 mEq total) by mouth daily. 180 tablet 2  . pravastatin (PRAVACHOL) 20 MG tablet TAKE 1 TABLET BY MOUTH ONCE DAILY 6 IN THE EVENING 90 tablet 6  . Probiotic Product (PROBIOTIC DAILY) CAPS Take 1 capsule by mouth daily.    . psyllium (METAMUCIL) 58.6 % powder Take 1 packet by mouth daily as needed (fiber).     . vitamin C (ASCORBIC ACID) 500 MG tablet Take 500 mg by mouth every other day.     No current facility-administered medications for this visit.     Allergies  Allergen Reactions  . Statins Other (See Comments)    Statins make the legs ache  . Tricor [Fenofibrate] Other (See Comments)    Leg pain   . Latex Itching and Other (See Comments)    Affected areas turn red, also-  . Tape Rash    Heart leads break out the skin    Review of Systems negative except from HPI and PMH  Physical Exam BP (!) 148/80   Pulse 71   Ht 5\' 2"  (1.575 m)   Wt 207 lb (93.9 kg)   SpO2 94%   BMI 37.86 kg/m  Well developed and nourished in no acute distress HENT normal Neck supple with JVP 8 Clear Regular rate and rhythm, no murmurs or gallops Abd-soft with active BS No Clubbing cyanosis 2+ edema Skin-warm and dry A & Oriented  Grossly normal sensory and motor function  ECG sinus P-synchronous/ AV  pacing QRS negative lead I upright lead V1 all    Assessment and  Plan Complete heart block permanent  CRT-D The patient's device was interrogated.  The information was reviewed. No changes were made in the programming.   Hypertension  Well controlled   Coronary artery disease status post CABG previously normal LV function  .Without symptoms of ischemia  Cardiomyopathy ? Mechanism  Ischemic vs pacemaker induced  Atrial fibrillation  CHF acute chronic mixed  Have her decrease her p.o. intake rather than try to address her heart failure by increasing her diuretics.  There is a great deal of opportunity simply this way.  Discussed the physiology of heart failure.  Interval ischemia.  No bleeding.  We will check a metabolic profile  On diuretics   We spent more than 50% of our >25 min visit in face to face counseling regarding the above

## 2018-07-12 NOTE — Addendum Note (Signed)
Addended by: Levander Campion C on: 07/12/2018 02:41 PM   Modules accepted: Orders

## 2018-07-13 ENCOUNTER — Other Ambulatory Visit: Payer: Self-pay | Admitting: Family Medicine

## 2018-07-13 ENCOUNTER — Telehealth: Payer: Self-pay | Admitting: Cardiology

## 2018-07-13 DIAGNOSIS — Z1231 Encounter for screening mammogram for malignant neoplasm of breast: Secondary | ICD-10-CM

## 2018-07-13 LAB — BASIC METABOLIC PANEL
BUN / CREAT RATIO: 22 (ref 12–28)
BUN: 17 mg/dL (ref 8–27)
CO2: 22 mmol/L (ref 20–29)
CREATININE: 0.77 mg/dL (ref 0.57–1.00)
Calcium: 9.3 mg/dL (ref 8.7–10.3)
Chloride: 102 mmol/L (ref 96–106)
GFR calc Af Amer: 90 mL/min/{1.73_m2} (ref >59)
GFR calc non Af Amer: 78 mL/min/{1.73_m2} (ref >59)
GLUCOSE: 134 mg/dL — AB (ref 65–99)
POTASSIUM: 4.1 mmol/L (ref 3.5–5.2)
SODIUM: 141 mmol/L (ref 134–144)

## 2018-07-13 NOTE — Telephone Encounter (Signed)
LAM for patient to call back to schedule 6 month f/up due 08/07/18

## 2018-07-27 ENCOUNTER — Other Ambulatory Visit: Payer: Self-pay | Admitting: Physician Assistant

## 2018-08-04 ENCOUNTER — Ambulatory Visit: Payer: Medicare HMO | Admitting: Cardiology

## 2018-08-04 VITALS — BP 144/76 | HR 87 | Ht 62.0 in | Wt 208.8 lb

## 2018-08-04 DIAGNOSIS — I5022 Chronic systolic (congestive) heart failure: Secondary | ICD-10-CM | POA: Diagnosis not present

## 2018-08-04 DIAGNOSIS — I110 Hypertensive heart disease with heart failure: Secondary | ICD-10-CM | POA: Diagnosis not present

## 2018-08-04 DIAGNOSIS — I2510 Atherosclerotic heart disease of native coronary artery without angina pectoris: Secondary | ICD-10-CM

## 2018-08-04 DIAGNOSIS — I480 Paroxysmal atrial fibrillation: Secondary | ICD-10-CM

## 2018-08-04 DIAGNOSIS — Z95810 Presence of automatic (implantable) cardiac defibrillator: Secondary | ICD-10-CM | POA: Diagnosis not present

## 2018-08-04 DIAGNOSIS — E785 Hyperlipidemia, unspecified: Secondary | ICD-10-CM | POA: Diagnosis not present

## 2018-08-04 DIAGNOSIS — I4821 Permanent atrial fibrillation: Secondary | ICD-10-CM

## 2018-08-04 NOTE — Progress Notes (Signed)
Cardiology Office Note:    Date:  08/04/2018   ID:  [name], DOB [birth year], MRN [id number]  PCP:  Billie Ruddy, MD  Cardiologist:  Shirlee More, MD    Referring MD: Billie Ruddy, MD    ASSESSMENT:    1. Chronic systolic CHF (congestive heart failure) ([place])   2. Hypertensive heart disease with chronic systolic congestive heart failure ([place])   3. Biventricular automatic implantable cardioverter defibrillator in situ   4. Permanent atrial fibrillation   5. Coronary artery disease involving native coronary artery of native heart without angina pectoris   6. Hyperlipidemia, unspecified hyperlipidemia type    PLAN:    In order of problems listed above:  1. Her heart failure is nicely compensated New York Heart Association class I she will continue current dose of loop diuretic along with ACE inhibitor.  Ejection fraction is greater than 40% would not transition to Entresto 2. Stable blood pressure target continue current treatment 3. Good response improvement ejection fraction paced virtually 100% of the time. 4. Atrial fibrillation is paroxysmal she has little or no atrial arrhythmia and her device check and I would not transition from platelet to anticoagulant at this time 5. Stable continue her current combination of aspirin clopidogrel along with her beta-blocker and statin 6. Continue her current statin lipids are at target   Next appointment: 6 months    Medication Adjustments/Labs and Tests Ordered: Current medicines are reviewed at length with the patient today.  Concerns regarding medicines are outlined above.  No orders of the defined types were placed in this encounter.  No orders of the defined types were placed in this encounter.   Chief Complaint  Patient presents with  . Follow-up    for CAD heart failureand CRT pacemaker    History of Present Illness:    Laurie [name] is a 72 y.o. female with a hx of coronary artery disease coronary  artery bypass in 2000 surgery and PCI in 2019  cardiomyopathy in the setting of right ventricular pacing with upgrade to CRT in 2017  with ejection fraction 30 to 35% improved to 40-45% paroxysmal atrial fibrillation congestive heart failure previous stroke last seen 10/28/17. Compliance with diet, lifestyle and medications: yes Past Medical History:  Diagnosis Date  . AICD (automatic cardioverter/defibrillator) present   . Bell's palsy 01/05/2010  . CAD (coronary artery disease)    Cath  07/21/1999  mild ostial, 80% stenosis proximal LAD, 90% stenosis proximal Diag 1, 95% stenosis proximal OM 1, 80% stenosis proximal RCA, 95% stenosis mid RCA    CABG 07/22/99  LIMA to LAD, SVG to dx, SVG to OM, SVG to PDA  Dr. [name]   Cardiolite 2012 no ischemia EF 67%  Cath 10/23/12 Severe native three-vessel coronary artery disease with occlusion of all 3 native coronary arteries, Patent sap  . Chronic lower back pain   . Chronic systolic CHF (congestive heart failure) ([place]) 10/22/2015  . Cystitis   . Hepatitis 1970s   "don't know which" (03/17/2017)  . HIstory of Bell's palsy    October 2012   . Hyperlipidemia    Intolerance to several statins   . Hypertensive heart disease without CHF   . Multinodular goiter   . Obesity (BMI 30-39.9)   . OSA on CPAP    "suppose to wear a mask" (03/17/2017)  . Peripheral neuropathy   . Pneumonia 1990s X 1   "think i had walking pneumonia"  . Seasonal allergies   .  Stroke ([place]) 03/2017   "light one"; denies residual on 03/17/2017)  . Type II diabetes mellitus ([place])     Past Surgical History:  Procedure Laterality Date  . BI-VENTRICULAR PACEMAKER UPGRADE  11/05/2015   "upgraded my pacemaker"  . BIOPSY THYROID  05/2010   percutaneous  . CARDIAC CATHETERIZATION N/A 10/21/2015   Procedure: Left Heart Cath and Cors/Grafts Angiography;  Surgeon: Belva Crome, MD;  Location: [place] [facility];  Service: Cardiovascular;  Laterality: N/A;  . CARPAL TUNNEL RELEASE Bilateral   .  CATARACT EXTRACTION Right 01/06/2015  . CATARACT EXTRACTION W/ INTRAOCULAR LENS IMPLANT Left 12/16/2014  . CORONARY ARTERY BYPASS GRAFT  2000  . CORONARY STENT INTERVENTION N/A 10/28/2017   Procedure: CORONARY STENT INTERVENTION;  Surgeon: Jettie Booze, MD;  Location: [place] [facility];  Service: Cardiovascular;  Laterality: N/A;  . CYSTECTOMY Right    "middle finger"  . DENTAL IMPLANTS    . EP IMPLANTABLE DEVICE N/A 11/05/2015   Procedure: BiV Upgrade;  Surgeon: Deboraha Sprang, MD;  Location: [place] [facility];  Service: Cardiovascular;  Laterality: N/A;  . INSERT / REPLACE / REMOVE PACEMAKER    . LEAD REVISION N/A 10/25/2012   Procedure: LEAD REVISION;  Surgeon: Evans Lance, MD;  Location: [facility] CATH LAB;  Service: Cardiovascular;  Laterality: N/A;  . LEFT HEART CATH AND CORS/GRAFTS ANGIOGRAPHY N/A 10/28/2017   Procedure: LEFT HEART CATH AND CORS/GRAFTS ANGIOGRAPHY;  Surgeon: Jettie Booze, MD;  Location: [place] [facility];  Service: Cardiovascular;  Laterality: N/A;  . LEFT HEART CATHETERIZATION WITH CORONARY ANGIOGRAM N/A 10/23/2012   Procedure: LEFT HEART CATHETERIZATION WITH CORONARY ANGIOGRAM;  Surgeon: Jacolyn Reedy, MD;  Location: [facility] CATH LAB;  Service: Cardiovascular;  Laterality: N/A;  . PERMANENT PACEMAKER INSERTION N/A 10/23/2012   Procedure: PERMANENT PACEMAKER INSERTION;  Surgeon: Deboraha Sprang, MD;  Location: [facility] CATH LAB;  Service: Cardiovascular;  Laterality: N/A;  . PLANTAR FASCIA RELEASE Left    "& clipped a tendon that went thru bottom of my foot"  . TEMPORARY PACEMAKER INSERTION N/A 10/22/2012   Procedure: TEMPORARY PACEMAKER INSERTION;  Surgeon: Troy Sine, MD;  Location: [facility] CATH LAB;  Service: Cardiovascular;  Laterality: N/A;  . TUBAL LIGATION      Current Medications: Current Meds  Medication Sig  . ACCU-CHEK FASTCLIX LANCETS MISC USE  TO CHECK BLOOD SUGAR FOUR TIMES DAILY  . ACCU-CHEK SMARTVIEW test strip USE  TO CHECK BLOOD SUGAR FOUR TIMES  DAILY  . Alcohol Swabs (B-D SINGLE USE SWABS REGULAR) PADS Use 8 daily for testing blood sugar and insulin injections.  [name] aspirin 81 MG tablet Take 1 tablet (81 mg total) by mouth daily.  . beta carotene w/minerals (OCUVITE) tablet Take 1 tablet by mouth daily.  . Blood Glucose Calibration (ACCU-CHEK SMARTVIEW CONTROL) LIQD Use to calibrate blood glucose meter.  . cetirizine (ZYRTEC) 10 MG tablet Take 1 tablet (10 mg total) by mouth daily.  . cholecalciferol (VITAMIN D) 1000 UNITS tablet Take 1,000 Units by mouth daily.   . clopidogrel (PLAVIX) 75 MG tablet TAKE 1 TABLET BY MOUTH ONCE DAILY PLEASE  KEEP  UPCOMING  APPT  FOR  MORE  REFILLS  . Continuous Blood Gluc Sensor (FREESTYLE LIBRE 14 DAY SENSOR) MISC 1 Device by Does not apply route every 14 (fourteen) days.  . furosemide (LASIX) 40 MG tablet TAKE 2 TABLETS BY MOUTH EVERY MORNING AND 1 TABLET BY MOUTH EVERY EVENING.TAKE 1 ADDITIONAL TABLET BY MOUTH FOR  WEIGHT GAIN OF 3 LBS OR MORE  . GLUCOSAMINE-CHONDROITIN PO Take 1 tablet by mouth daily.  . insulin regular (NOVOLIN R RELION) 100 units/mL injection 3 times a day (just before each meal) 220-220-130 units  . Insulin Syringe-Needle U-100 (BD VEO INSULIN SYR ULTRAFINE) 31G X 15/64" 1 ML MISC USE 10 TIMES A DAY WITH INSULIN AS DIRECTED  . ketoconazole (NIZORAL) 2 % cream Apply 1 application topically daily as needed for irritation. TO AFFECTED AREAS OF FACE   . lisinopril (PRINIVIL,ZESTRIL) 20 MG tablet Take 20 mg by mouth daily.  . Multiple Vitamins-Minerals (MULTIVITAMIN WITH MINERALS) tablet Take 1 tablet by mouth daily.  . nitroGLYCERIN (NITROSTAT) 0.4 MG SL tablet Place 0.4 mg under the tongue every 5 (five) minutes as needed for chest pain.   . potassium chloride SA (KLOR-CON M20) 20 MEQ tablet Take 1 tablet (20 mEq total) by mouth daily.  . pravastatin (PRAVACHOL) 20 MG tablet TAKE 1 TABLET BY MOUTH ONCE DAILY 6 IN THE EVENING  . Probiotic Product (PROBIOTIC DAILY) CAPS Take 1 capsule by  mouth daily.  . psyllium (METAMUCIL) 58.6 % powder Take 1 packet by mouth daily as needed (fiber).   . vitamin C (ASCORBIC ACID) 500 MG tablet Take 500 mg by mouth every other day.     Allergies:   Brilinta [ticagrelor]; Statins; Tricor [fenofibrate]; Latex; and Tape   Social History   Socioeconomic History  . Marital status: Married    Spouse name: Not on file  . Number of children: 2  . Years of education: Not on file  . Highest education level: Not on file  Occupational History    Employer: RETIRED  Social Needs  . Financial resource strain: Not on file  . Food insecurity:    Worry: Not on file    Inability: Not on file  . Transportation needs:    Medical: Not on file    Non-medical: Not on file  Tobacco Use  . Smoking status: Never Smoker  . Smokeless tobacco: Never Used  Substance and Sexual Activity  . Alcohol use: No    Alcohol/week: 0.0 standard drinks  . Drug use: No  . Sexual activity: Never  Lifestyle  . Physical activity:    Days per week: Not on file    Minutes per session: Not on file  . Stress: Not on file  Relationships  . Social connections:    Talks on phone: Not on file    Gets together: Not on file    Attends religious service: Not on file    Active member of club or organization: Not on file    Attends meetings of clubs or organizations: Not on file    Relationship status: Not on file  Other Topics Concern  . Not on file  Social History Narrative   Does not work outside the home     Family History: The patient's family history includes Diabetes in her father, mother, and other; Heart disease in her father and other. There is no history of Colon cancer or Colon polyps. ROS:   Please see the history of present illness.    All other systems reviewed and are negative.  EKGs/Labs/Other Studies Reviewed:    The following studies were reviewed today:  EKG:  EKG ordered today.  The ekg ordered today demonstrates AV paced CRT Device check  with 99% paced BiV < 1% mode switch, longest < 10 secs Recent Labs: 10/28/2017: ALT 32 10/29/2017: Hemoglobin 13.4; Platelets 226 07/10/2018: TSH  1.24 07/12/2018: BUN 17; Creatinine, Ser 0.77; Potassium 4.1; Sodium 141  Recent Lipid Panel       Component Value Date/Time   CHOL 169 10/28/2017 1700   TRIG 178 (H) 10/28/2017 1700   HDL 37 (L) 10/28/2017 1700   CHOLHDL 4.6 10/28/2017 1700   VLDL 36 10/28/2017 1700   LDLCALC 96 10/28/2017 1700   LDLDIRECT 155.1 06/05/2012 1340    Physical Exam:    VS:  BP (!) 144/76 (BP Location: Right Arm, Patient Position: Sitting, Cuff Size: Large)   Pulse 87   Ht 5\' 2"  (1.575 m)   Wt 208 lb 12.8 oz (94.7 kg)   SpO2 95%   BMI 38.19 kg/m     Wt Readings from Last 3 Encounters:  08/04/18 208 lb 12.8 oz (94.7 kg)  07/12/18 207 lb (93.9 kg)  07/10/18 207 lb (93.9 kg)     GEN:  Well nourished, well developed in no acute distress HEENT: Normal NECK: No JVD; No carotid bruits LYMPHATICS: No lymphadenopathy CARDIAC: RRR, no murmurs, rubs, gallops RESPIRATORY:  Clear to auscultation without rales, wheezing or rhonchi  ABDOMEN: Soft, non-tender, non-distended MUSCULOSKELETAL:  No edema; No deformity  SKIN: Warm and dry NEUROLOGIC:  Alert and oriented x 3 PSYCHIATRIC:  Normal affect    Signed, Shirlee More, MD  08/04/2018 2:41 PM    [facility]

## 2018-08-04 NOTE — Patient Instructions (Signed)

## 2018-08-07 ENCOUNTER — Telehealth: Payer: Self-pay

## 2018-08-07 NOTE — Telephone Encounter (Signed)
Referred to ICM clinic by Levander Campion, device RN during office visit with Dr [name].  Spoke with patient and agreeable to monthly follow up.  Advised monitor should be by bedside in order for it to automatically transmit a report during sleep hours of 12 midnight and 6 AM.  Advised will receive a call after the transmission is reviewed to provide results.  Explained a Remote Home Transmission will be seen as daytime appointment on office summary visit sheet but the time is not relevant since all transmissions are sent at night time so there is no obligation to stay by the monitor at that time appointed time during the day. Provided ICM number and explained should call if experiencing any fluid symptoms such as weight gain, shortness of breath or extremity/abdominal swelling.  Patient has remote transmission scheduled 08/08/2018

## 2018-08-08 ENCOUNTER — Telehealth: Payer: Self-pay

## 2018-08-08 ENCOUNTER — Ambulatory Visit (INDEPENDENT_AMBULATORY_CARE_PROVIDER_SITE_OTHER): Payer: Medicare HMO

## 2018-08-08 DIAGNOSIS — Z95810 Presence of automatic (implantable) cardiac defibrillator: Secondary | ICD-10-CM

## 2018-08-08 DIAGNOSIS — I5022 Chronic systolic (congestive) heart failure: Secondary | ICD-10-CM

## 2018-08-08 DIAGNOSIS — I442 Atrioventricular block, complete: Secondary | ICD-10-CM

## 2018-08-08 NOTE — Progress Notes (Signed)
Remote ICD transmission.   

## 2018-08-08 NOTE — Telephone Encounter (Signed)
Remote ICM transmission received.  Attempted call to patient regarding ICM remote transmission and left message to return call   

## 2018-08-08 NOTE — Progress Notes (Signed)
EPIC Encounter for ICM Monitoring  Patient Name: Laurie [name] is a 72 y.o. female Date: 08/08/2018 Primary Care Physican: Billie Ruddy, MD Primary Cardiologist: Bettina Gavia Electrophysiologist: Vergie Living Pacing:   >99%  Last Weight: 207 lbs (08/07/2018) Today's Weight: unknown       1st ICM remote. Spoke with patient 11/25 for ICM intro call.  She stated her weight is up about 4 pounds and she is taking extra Furosemide as prescribed.  Baseline line weight 203 lbs.   Thoracic impedance abnormal suggesting fluid accumulation starting 07/30/2018 but is starting to trend toward baseline.   Prescribed: Furosemide 40 mg take 2 tablets (80 mg total) by mouth every morning and 1 tablet (40 mg total) every evening. Take extra tablet as needed for weight gain of 3 lbs or more.   Labs: 07/12/2018 Creatinine 0.77, BUN 17, Potassium 4.1, Sodium 141, eGFR 78-90  Recommendations:  Unable to reach patient today.   Follow-up plan: ICM clinic phone appointment on 08/17/2018 to recheck fluid levels.      Copy of ICM check sent to Dr. [name] and Dr Bettina Gavia.   3 month ICM trend: 08/08/2018    1 Year ICM trend:       [name], RN 08/08/2018 7:37 AM

## 2018-08-15 DIAGNOSIS — Z961 Presence of intraocular lens: Secondary | ICD-10-CM | POA: Diagnosis not present

## 2018-08-15 DIAGNOSIS — H18413 Arcus senilis, bilateral: Secondary | ICD-10-CM | POA: Diagnosis not present

## 2018-08-15 DIAGNOSIS — H02831 Dermatochalasis of right upper eyelid: Secondary | ICD-10-CM | POA: Diagnosis not present

## 2018-08-15 DIAGNOSIS — H26491 Other secondary cataract, right eye: Secondary | ICD-10-CM | POA: Diagnosis not present

## 2018-08-21 ENCOUNTER — Ambulatory Visit (INDEPENDENT_AMBULATORY_CARE_PROVIDER_SITE_OTHER): Payer: Medicare HMO

## 2018-08-21 DIAGNOSIS — I5022 Chronic systolic (congestive) heart failure: Secondary | ICD-10-CM

## 2018-08-21 DIAGNOSIS — Z95810 Presence of automatic (implantable) cardiac defibrillator: Secondary | ICD-10-CM

## 2018-08-22 DIAGNOSIS — H5203 Hypermetropia, bilateral: Secondary | ICD-10-CM | POA: Diagnosis not present

## 2018-08-22 DIAGNOSIS — H524 Presbyopia: Secondary | ICD-10-CM | POA: Diagnosis not present

## 2018-08-22 DIAGNOSIS — H52223 Regular astigmatism, bilateral: Secondary | ICD-10-CM | POA: Diagnosis not present

## 2018-08-22 DIAGNOSIS — Z9849 Cataract extraction status, unspecified eye: Secondary | ICD-10-CM | POA: Diagnosis not present

## 2018-08-22 NOTE — Progress Notes (Signed)
EPIC Encounter for ICM Monitoring  Patient Name: Laurie [name] is a 72 y.o. female Date: 08/22/2018 Primary Care Physican: Billie Ruddy, MD Primary Cardiologist: Bettina Gavia Electrophysiologist: Vergie Living Pacing:   >99%       Last Weight: 207 lbs (08/07/2018) Today's Weight: 201 lbs                                                           Spoke with patient. Transmission reviewed.  She reported weight loss of 6 lbs since 11/25 and feeling much better.     Thoracic impedance returned to normal 08/18/2018.   Prescribed: Furosemide 40 mg take 2 tablets (80 mg total) by mouth every morning and 1 tablet (40 mg total) every evening. Take extra tablet as needed for weight gain of 3 lbs or more.   Labs: 07/12/2018 Creatinine 0.77, BUN 17, Potassium 4.1, Sodium 141, eGFR 78-90  Recommendations:   No changes today.  Encouraged to call for any fluid symptoms   Follow-up plan: ICM clinic phone appointment on 08/17/2018 to recheck fluid levels.      Copy of ICM check sent to Dr. [name].   3 month ICM trend: 08/17/2018    Direct Trend Viewer through 08/18/2018    1 Year ICM trend:       [name], RN 08/22/2018 11:48 AM

## 2018-08-24 ENCOUNTER — Ambulatory Visit: Payer: Medicare HMO

## 2018-09-11 ENCOUNTER — Other Ambulatory Visit: Payer: Self-pay | Admitting: Internal Medicine

## 2018-09-14 ENCOUNTER — Other Ambulatory Visit: Payer: Self-pay

## 2018-09-14 ENCOUNTER — Telehealth: Payer: Self-pay | Admitting: Cardiology

## 2018-09-14 MED ORDER — FUROSEMIDE 40 MG PO TABS: ORAL_TABLET | ORAL | 3 refills | Status: DC

## 2018-09-14 MED ORDER — METOPROLOL SUCCINATE ER 50 MG PO TB24: 50.0000 mg | ORAL_TABLET | Freq: Every day | ORAL | 3 refills | Status: DC

## 2018-09-14 NOTE — Telephone Encounter (Signed)
°  1. Which medications need to be refilled? (please list name of each medication and dose if known) Metoprolol ER 50mg  tablet  2. Which pharmacy/location (including street and city if local pharmacy) is medication to be sent to? [place]    3. Do they need a 30 day or 90 day supply? [place]

## 2018-09-14 NOTE — Telephone Encounter (Signed)
Refills sent and medication added.

## 2018-09-14 NOTE — Telephone Encounter (Signed)
S/w pt she states she has been taking metoprolol succinate ER 50 mg daily since seeing Dr. Wynonia Lawman. Pt seen Dr. Bettina Gavia in Nov. 2019 however this medication was not reported as taken, even though pt voiced on the phone she brought this medication in. Records from [place] visit on 02/03/18 report shows pt was taking this medication. Pt states she currently ran out of this medication yesterday. Advised pt I would ask Dr. Bettina Gavia for advise on refilling this medication. Pt also states she needed refill for lasix as well. Lasix refill has been sent.

## 2018-09-14 NOTE — Telephone Encounter (Signed)
S/w [facility] pharmacy, they report last time medication was picked up or filled for pt was on 03/03/18. Pt will need f/u w/provider for any refills of this medication as it was not reported at last visit as she was taken.

## 2018-09-14 NOTE — Telephone Encounter (Signed)
Renew medication as you described #93 refills and please amend medication so that we have the correct med dosage and frequency

## 2018-09-15 ENCOUNTER — Ambulatory Visit: Payer: Medicare HMO

## 2018-09-15 ENCOUNTER — Ambulatory Visit: Payer: Medicare HMO | Admitting: Family Medicine

## 2018-09-21 ENCOUNTER — Ambulatory Visit: Payer: Medicare HMO | Admitting: Family Medicine

## 2018-09-21 DIAGNOSIS — Z0289 Encounter for other administrative examinations: Secondary | ICD-10-CM

## 2018-09-28 ENCOUNTER — Ambulatory Visit (INDEPENDENT_AMBULATORY_CARE_PROVIDER_SITE_OTHER): Payer: Medicare HMO

## 2018-09-28 DIAGNOSIS — Z95810 Presence of automatic (implantable) cardiac defibrillator: Secondary | ICD-10-CM | POA: Diagnosis not present

## 2018-09-28 DIAGNOSIS — I5022 Chronic systolic (congestive) heart failure: Secondary | ICD-10-CM | POA: Diagnosis not present

## 2018-09-28 NOTE — Progress Notes (Signed)
EPIC Encounter for ICM Monitoring  Patient Name: Laurie [name] is a 73 y.o. female Date: 09/28/2018 Primary Care Physican: Billie Ruddy, MD Primary Cardiologist:Munley Electrophysiologist:Klein Bi-V Pacing:>99% Last Weight:201lbs Today's Weight: 192 lbs        Heart Failure questions reviewed.   Pt has ankle swelling because she forgot to take Furosemide dosages yesterday (09/27/2018) which correlates with decreased impedance.   Report: Thoracic impedance abnormal suggesting fluid accumulation starting 09/27/2018.  Prescribed: Furosemide40 mg take 2 tablets (80 mg total) by mouth every morning and 1 tablet (40 mg total) every evening. Take extra tablet as needed for weight gain of 3 lbs or more.  Labs: 07/12/2018 Creatinine 0.77, BUN 17, Potassium 4.1, Sodium 141, eGFR 78-90  Recommendations:  She has taken her prescribed Furosemide today.  Encouraged to call for fluid symptoms.  Follow-up plan: ICM clinic phone appointment on 10/06/2018 to recheck fluid levels.       Copy of ICM check sent to Dr. [name] and Dr Bettina Gavia.   3 month ICM trend: 09/28/2018    1 Year ICM trend:       [name], RN 09/28/2018 11:47 AM

## 2018-09-29 LAB — CUP PACEART REMOTE DEVICE CHECK
Battery Remaining Longevity: 54 mo
Battery Remaining Percentage: 63 %
Battery Voltage: 2.96 V
Brady Statistic AP VP Percent: 7.9 %
Brady Statistic AP VS Percent: 1 %
Brady Statistic AS VP Percent: 91 %
Brady Statistic AS VS Percent: 1 %
Brady Statistic RA Percent Paced: 7.2 %
Date Time Interrogation Session: 20191126070018
HighPow Impedance: 59 Ohm
HighPow Impedance: 59 Ohm
Implantable Lead Implant Date: 20140210
Implantable Lead Implant Date: 20170222
Implantable Lead Implant Date: 20170222
Implantable Lead Location: 753858
Implantable Lead Location: 753860
Implantable Lead Model: 5076
Implantable Lead Model: 7122
Lead Channel Impedance Value: 340 Ohm
Lead Channel Pacing Threshold Amplitude: 0.5 V
Lead Channel Pacing Threshold Amplitude: 0.5 V
Lead Channel Pacing Threshold Amplitude: 0.75 V
Lead Channel Pacing Threshold Pulse Width: 0.5 ms
Lead Channel Pacing Threshold Pulse Width: 0.5 ms
Lead Channel Pacing Threshold Pulse Width: 0.5 ms
Lead Channel Sensing Intrinsic Amplitude: 12 mV
Lead Channel Sensing Intrinsic Amplitude: 2.9 mV
Lead Channel Setting Pacing Amplitude: 2 V
Lead Channel Setting Pacing Amplitude: 2 V
Lead Channel Setting Pacing Amplitude: 2 V
Lead Channel Setting Pacing Pulse Width: 0.5 ms
Lead Channel Setting Pacing Pulse Width: 0.5 ms
Lead Channel Setting Sensing Sensitivity: 0.5 mV
MDC IDC LEAD LOCATION: 753859
MDC IDC MSMT LEADCHNL LV IMPEDANCE VALUE: 360 Ohm
MDC IDC MSMT LEADCHNL RV IMPEDANCE VALUE: 450 Ohm
MDC IDC PG IMPLANT DT: 20170222
Pulse Gen Serial Number: 7341405

## 2018-10-06 ENCOUNTER — Other Ambulatory Visit: Payer: Self-pay | Admitting: Endocrinology

## 2018-10-06 ENCOUNTER — Ambulatory Visit (INDEPENDENT_AMBULATORY_CARE_PROVIDER_SITE_OTHER): Payer: Medicare HMO

## 2018-10-06 ENCOUNTER — Telehealth: Payer: Self-pay

## 2018-10-06 DIAGNOSIS — Z95810 Presence of automatic (implantable) cardiac defibrillator: Secondary | ICD-10-CM

## 2018-10-06 DIAGNOSIS — I5022 Chronic systolic (congestive) heart failure: Secondary | ICD-10-CM

## 2018-10-06 NOTE — Progress Notes (Signed)
EPIC Encounter for ICM Monitoring  Patient Name: Laurie [name] is a 73 y.o. female Date: 10/06/2018 Primary Care Physican: Billie Ruddy, MD Primary Cardiologist:Munley Electrophysiologist:Klein Bi-V Pacing:>99% Last Weight:192lbs Today's Weight: unknown                                                            Attempted call to patient.  Transmission reviewed.   Report: Thoracic impedance returned to norma but now above baseline suggesting dryness.  Prescribed: Furosemide40 mg take 2 tablets (80 mg total) by mouth every morning and 1 tablet (40 mg total) every evening. Take extra tablet as needed for weight gain of 3 lbs or more.  Labs: 07/12/2018 Creatinine 0.77, BUN 17, Potassium 4.1, Sodium 141, eGFR 78-90  Recommendations:  Unable to reach.  Follow-up plan: ICM clinic phone appointment on 11/07/2018.       Copy of ICM check sent to Dr. [name].  3 month ICM trend: 10/06/2018    1 Year ICM trend:       [name], RN 10/06/2018 5:05 PM

## 2018-10-06 NOTE — Telephone Encounter (Signed)
Remote ICM transmission received.  Attempted call to patient regarding ICM remote transmission and mail box is full. 

## 2018-10-10 ENCOUNTER — Ambulatory Visit: Payer: Medicare HMO | Admitting: Endocrinology

## 2018-10-10 ENCOUNTER — Encounter: Payer: Self-pay | Admitting: Endocrinology

## 2018-10-10 VITALS — BP 150/62 | HR 83 | Ht 62.0 in | Wt 199.2 lb

## 2018-10-10 DIAGNOSIS — Z794 Long term (current) use of insulin: Secondary | ICD-10-CM

## 2018-10-10 DIAGNOSIS — E1140 Type 2 diabetes mellitus with diabetic neuropathy, unspecified: Secondary | ICD-10-CM | POA: Diagnosis not present

## 2018-10-10 LAB — POCT GLYCOSYLATED HEMOGLOBIN (HGB A1C): Hemoglobin A1C: 10.1 % — AB (ref 4.0–5.6)

## 2018-10-10 MED ORDER — INSULIN REGULAR HUMAN (CONC) 500 UNIT/ML ~~LOC~~ SOLN: SUBCUTANEOUS | 11 refills | Status: DC

## 2018-10-10 NOTE — Progress Notes (Signed)
Subjective:    Patient ID: Laurie [name], female    DOB: [birth year], 73 y.o.   MRN: [id number]  HPI Pt returns for f/u of diabetes mellitus:   DM type: insulin-requiring type 2 Dx'ed: 7262 Complications: polyneuropathy, CVA, and CAD.   Therapy: insulin since 2011.   GDM: 1985.   DKA: never Severe hypoglycemia: never.   Pancreatitis: never.   Other: she has severe insulin resistance; she required very little insulin during a hospitalization in early 2014; she wants to delay weight-loss surgery for now; she takes human insulin, due to cost; she has long duration of action of insulin, and based on pattern of cbg's, does not need basal insulin.    Interval history: Pt recently had URI, but no recent steroids.  She takes the insulin as rx'ed.  no cbg record, but states cbg's are approx 200.  She seldom has hypoglycemia, and these episodes are mild.   Pt also has multinodular goiter (dx'ed 2011; bx then showed HYPERPLASTIC LESION; she has been euthyroid; f/u US in late 2018 was unchanged, and no f/u was advised).    Past Medical History:  Diagnosis Date  . AICD (automatic cardioverter/defibrillator) present   . Bell's palsy 01/05/2010  . CAD (coronary artery disease)    Cath  07/21/1999  mild ostial, 80% stenosis proximal LAD, 90% stenosis proximal Diag 1, 95% stenosis proximal OM 1, 80% stenosis proximal RCA, 95% stenosis mid RCA    CABG 07/22/99  LIMA to LAD, SVG to dx, SVG to OM, SVG to PDA  Dr. [name]   Cardiolite 2012 no ischemia EF 67%  Cath 10/23/12 Severe native three-vessel coronary artery disease with occlusion of all 3 native coronary arteries, Patent sap  . Chronic lower back pain   . Chronic systolic CHF (congestive heart failure) ([place]) 10/22/2015  . Cystitis   . Hepatitis 1970s   "don't know which" (03/17/2017)  . HIstory of Bell's palsy    October 2012   . Hyperlipidemia    Intolerance to several statins   . Hypertensive heart disease without CHF   . Multinodular goiter   .  Obesity (BMI 30-39.9)   . OSA on CPAP    "suppose to wear a mask" (03/17/2017)  . Peripheral neuropathy   . Pneumonia 1990s X 1   "think i had walking pneumonia"  . Seasonal allergies   . Stroke ([place]) 03/2017   "light one"; denies residual on 03/17/2017)  . Type II diabetes mellitus ([place])     Past Surgical History:  Procedure Laterality Date  . BI-VENTRICULAR PACEMAKER UPGRADE  11/05/2015   "upgraded my pacemaker"  . BIOPSY THYROID  05/2010   percutaneous  . CARDIAC CATHETERIZATION N/A 10/21/2015   Procedure: Left Heart Cath and Cors/Grafts Angiography;  Surgeon: Belva Crome, MD;  Location: [place] [facility];  Service: Cardiovascular;  Laterality: N/A;  . CARPAL TUNNEL RELEASE Bilateral   . CATARACT EXTRACTION Right 01/06/2015  . CATARACT EXTRACTION W/ INTRAOCULAR LENS IMPLANT Left 12/16/2014  . CORONARY ARTERY BYPASS GRAFT  2000  . CORONARY STENT INTERVENTION N/A 10/28/2017   Procedure: CORONARY STENT INTERVENTION;  Surgeon: Jettie Booze, MD;  Location: [place] [facility];  Service: Cardiovascular;  Laterality: N/A;  . CYSTECTOMY Right    "middle finger"  . DENTAL IMPLANTS    . EP IMPLANTABLE DEVICE N/A 11/05/2015   Procedure: BiV Upgrade;  Surgeon: Deboraha Sprang, MD;  Location: [place] [facility];  Service: Cardiovascular;  Laterality: N/A;  .  INSERT / REPLACE / REMOVE PACEMAKER    . LEAD REVISION N/A 10/25/2012   Procedure: LEAD REVISION;  Surgeon: Evans Lance, MD;  Location: [facility] CATH LAB;  Service: Cardiovascular;  Laterality: N/A;  . LEFT HEART CATH AND CORS/GRAFTS ANGIOGRAPHY N/A 10/28/2017   Procedure: LEFT HEART CATH AND CORS/GRAFTS ANGIOGRAPHY;  Surgeon: Jettie Booze, MD;  Location: [place] [facility];  Service: Cardiovascular;  Laterality: N/A;  . LEFT HEART CATHETERIZATION WITH CORONARY ANGIOGRAM N/A 10/23/2012   Procedure: LEFT HEART CATHETERIZATION WITH CORONARY ANGIOGRAM;  Surgeon: Jacolyn Reedy, MD;  Location: [facility] CATH LAB;  Service: Cardiovascular;   Laterality: N/A;  . PERMANENT PACEMAKER INSERTION N/A 10/23/2012   Procedure: PERMANENT PACEMAKER INSERTION;  Surgeon: Deboraha Sprang, MD;  Location: [facility] CATH LAB;  Service: Cardiovascular;  Laterality: N/A;  . PLANTAR FASCIA RELEASE Left    "& clipped a tendon that went thru bottom of my foot"  . TEMPORARY PACEMAKER INSERTION N/A 10/22/2012   Procedure: TEMPORARY PACEMAKER INSERTION;  Surgeon: Troy Sine, MD;  Location: [facility] CATH LAB;  Service: Cardiovascular;  Laterality: N/A;  . TUBAL LIGATION      Social History   Socioeconomic History  . Marital status: Married    Spouse name: Not on file  . Number of children: 2  . Years of education: Not on file  . Highest education level: Not on file  Occupational History    Employer: RETIRED  Social Needs  . Financial resource strain: Not on file  . Food insecurity:    Worry: Not on file    Inability: Not on file  . Transportation needs:    Medical: Not on file    Non-medical: Not on file  Tobacco Use  . Smoking status: Never Smoker  . Smokeless tobacco: Never Used  Substance and Sexual Activity  . Alcohol use: No    Alcohol/week: 0.0 standard drinks  . Drug use: No  . Sexual activity: Never  Lifestyle  . Physical activity:    Days per week: Not on file    Minutes per session: Not on file  . Stress: Not on file  Relationships  . Social connections:    Talks on phone: Not on file    Gets together: Not on file    Attends religious service: Not on file    Active member of club or organization: Not on file    Attends meetings of clubs or organizations: Not on file    Relationship status: Not on file  . Intimate partner violence:    Fear of current or ex partner: Not on file    Emotionally abused: Not on file    Physically abused: Not on file    Forced sexual activity: Not on file  Other Topics Concern  . Not on file  Social History Narrative   Does not work outside the home    Current Outpatient Medications on File Prior  to Visit  Medication Sig Dispense Refill  . ACCU-CHEK FASTCLIX LANCETS MISC USE  TO CHECK BLOOD SUGAR FOUR TIMES DAILY 408 each 2  . ACCU-CHEK SMARTVIEW test strip USE  TO CHECK BLOOD SUGAR FOUR TIMES DAILY 400 each 1  . Alcohol Swabs (B-D SINGLE USE SWABS REGULAR) PADS Use 8 daily for testing blood sugar and insulin injections. 800 each 1  . aspirin 81 MG tablet Take 1 tablet (81 mg total) by mouth daily. 30 tablet 11  . BD VEO INSULIN SYRINGE U/F 31G X 15/64" 1 ML  MISC USE 10 TIMES A DAY WITH INSULIN AS DIRECTED 300 each 0  . beta carotene w/minerals (OCUVITE) tablet Take 1 tablet by mouth daily.    . Blood Glucose Calibration (ACCU-CHEK SMARTVIEW CONTROL) LIQD Use to calibrate blood glucose meter. 3 each 1  . cetirizine (ZYRTEC) 10 MG tablet Take 1 tablet (10 mg total) by mouth daily.    . cholecalciferol (VITAMIN D) 1000 UNITS tablet Take 1,000 Units by mouth daily.     . clopidogrel (PLAVIX) 75 MG tablet TAKE 1 TABLET BY MOUTH ONCE DAILY PLEASE  KEEP  UPCOMING  APPT  FOR  MORE  REFILLS 30 tablet 11  . Continuous Blood Gluc Sensor (FREESTYLE LIBRE 14 DAY SENSOR) MISC 1 Device by Does not apply route every 14 (fourteen) days. 6 each 3  . furosemide (LASIX) 40 MG tablet TAKE 2 TABLETS BY MOUTH EVERY MORNING AND 1 TABLET EVERY EVENING. TAKE 1 ADDITIONAL TABLET FOR WEIGHT GAIN OF 3LBS OR MORE 360 tablet 3  . GLUCOSAMINE-CHONDROITIN PO Take 1 tablet by mouth daily.    [name] ketoconazole (NIZORAL) 2 % cream Apply 1 application topically daily as needed for irritation. TO AFFECTED AREAS OF FACE     . lisinopril (PRINIVIL,ZESTRIL) 20 MG tablet Take 20 mg by mouth daily.    . metoprolol succinate (TOPROL-XL) 50 MG 24 hr tablet Take 1 tablet (50 mg total) by mouth daily. Take with or immediately following a meal. 90 tablet 3  . Multiple Vitamins-Minerals (MULTIVITAMIN WITH MINERALS) tablet Take 1 tablet by mouth daily.    . nitroGLYCERIN (NITROSTAT) 0.4 MG SL tablet Place 0.4 mg under the tongue every 5  (five) minutes as needed for chest pain.     . potassium chloride SA (KLOR-CON M20) 20 MEQ tablet Take 1 tablet (20 mEq total) by mouth daily. 180 tablet 2  . pravastatin (PRAVACHOL) 20 MG tablet TAKE 1 TABLET BY MOUTH ONCE DAILY 6 IN THE EVENING 90 tablet 6  . Probiotic Product (PROBIOTIC DAILY) CAPS Take 1 capsule by mouth daily.    . psyllium (METAMUCIL) 58.6 % powder Take 1 packet by mouth daily as needed (fiber).     . vitamin C (ASCORBIC ACID) 500 MG tablet Take 500 mg by mouth every other day.     No current facility-administered medications on file prior to visit.     Allergies  Allergen Reactions  . Brilinta [Ticagrelor] Shortness Of Breath  . Statins Other (See Comments)    Statins make the legs ache  . Tricor [Fenofibrate] Other (See Comments)    Leg pain  . Latex Itching and Other (See Comments)    Affected areas turn red, also-  . Tape Rash    Heart leads break out the skin    Family History  Problem Relation Age of Onset  . Diabetes Father   . Heart disease Father   . Diabetes Mother   . Diabetes Other        Siblings and 2 children  . Heart disease Other        CAD  . Colon cancer Neg Hx   . Colon polyps Neg Hx     BP (!) 150/62 (BP Location: Right Arm, Patient Position: Sitting, Cuff Size: Large)   Pulse 83   Ht 5\' 2"  (1.575 m)   Wt 199 lb 3.2 oz (90.4 kg)   SpO2 93%   BMI 36.43 kg/m    Review of Systems Denies denies LOC.      Objective:  Physical Exam VITAL SIGNS:  See vs page GENERAL: no distress Pulses: dorsalis pedis intact bilat.  Skin: no ulcer on the feet. feet are of normal color and temp. There is a healed vein harvest scar at the right leg.  Feet: no deformity.  CV: 1+ bilat leg edema.  Neuro: sensation is intact to touch on the feet,but decreased from normal.    Lab Results  Component Value Date   HGBA1C 10.1 (A) 10/10/2018   Lab Results  Component Value Date   CREATININE 0.77 07/12/2018   BUN 17 07/12/2018   NA 141  07/12/2018   K 4.1 07/12/2018   CL 102 07/12/2018   CO2 22 07/12/2018      Assessment & Plan:  Insulin-requiring type 2 DM, with CAD: Worse.  Reason for deterioration is uncertain, so we won't increase dosage this time.   Severe insulin resistance: we discussed dosing of U-500 in a U-100 syringe.   Edema: this limits rx options Hypoglycemia: this limits aggressiveness of glycemic control.  HTN: recheck next time.    Patient Instructions  check your blood sugar 4 times a day--before the 3 meals, and at bedtime (the bedtime reading is important, as we may need to increase the suppertime insulin).  also check if you have symptoms of your blood sugar being too high or too low.  please keep a record of the readings and bring it to your next appointment here.  please call us sooner if you are having low blood sugar episodes.  Please change the insulin to "U-500," 220 at breakfast, 220 at lunch, and 130 at dinner (but you will draw up as 44-44-26 units).   I have sent a prescription to your pharmacy.   Eat a light snack at bedtime, to avoid the sugar going low in the morning.  Please come back for a follow-up appointment in 2 months.

## 2018-10-10 NOTE — Patient Instructions (Addendum)
check your blood sugar 4 times a day--before the 3 meals, and at bedtime (the bedtime reading is important, as we may need to increase the suppertime insulin).  also check if you have symptoms of your blood sugar being too high or too low.  please keep a record of the readings and bring it to your next appointment here.  please call us sooner if you are having low blood sugar episodes.  Please change the insulin to "U-500," 220 at breakfast, 220 at lunch, and 130 at dinner (but you will draw up as 44-44-26 units).   I have sent a prescription to your pharmacy.   Eat a light snack at bedtime, to avoid the sugar going low in the morning.  Please come back for a follow-up appointment in 2 months.

## 2018-10-27 ENCOUNTER — Other Ambulatory Visit: Payer: Self-pay | Admitting: Endocrinology

## 2018-11-07 ENCOUNTER — Ambulatory Visit (INDEPENDENT_AMBULATORY_CARE_PROVIDER_SITE_OTHER): Payer: Medicare HMO | Admitting: *Deleted

## 2018-11-07 DIAGNOSIS — I429 Cardiomyopathy, unspecified: Secondary | ICD-10-CM

## 2018-11-08 ENCOUNTER — Ambulatory Visit (INDEPENDENT_AMBULATORY_CARE_PROVIDER_SITE_OTHER): Payer: Medicare HMO

## 2018-11-08 DIAGNOSIS — Z95810 Presence of automatic (implantable) cardiac defibrillator: Secondary | ICD-10-CM

## 2018-11-08 DIAGNOSIS — I5022 Chronic systolic (congestive) heart failure: Secondary | ICD-10-CM | POA: Diagnosis not present

## 2018-11-08 LAB — CUP PACEART REMOTE DEVICE CHECK
Battery Remaining Longevity: 48 mo
Battery Remaining Percentage: 55 %
Battery Voltage: 2.87 V
Brady Statistic AP VP Percent: 13 %
Brady Statistic AP VS Percent: 1 %
Brady Statistic AS VP Percent: 86 %
Brady Statistic RA Percent Paced: 12 %
HighPow Impedance: 69 Ohm
HighPow Impedance: 69 Ohm
Implantable Lead Implant Date: 20170222
Implantable Lead Implant Date: 20170222
Implantable Lead Location: 753858
Implantable Lead Location: 753859
Implantable Lead Location: 753860
Implantable Lead Model: 4298
Implantable Lead Model: 5076
Implantable Lead Model: 7122
Lead Channel Impedance Value: 350 Ohm
Lead Channel Impedance Value: 400 Ohm
Lead Channel Impedance Value: 460 Ohm
Lead Channel Pacing Threshold Amplitude: 0.5 V
Lead Channel Pacing Threshold Amplitude: 0.75 V
Lead Channel Pacing Threshold Amplitude: 0.75 V
Lead Channel Pacing Threshold Pulse Width: 0.5 ms
Lead Channel Pacing Threshold Pulse Width: 0.5 ms
Lead Channel Pacing Threshold Pulse Width: 0.5 ms
Lead Channel Sensing Intrinsic Amplitude: 12 mV
Lead Channel Sensing Intrinsic Amplitude: 3.1 mV
Lead Channel Setting Pacing Amplitude: 2 V
Lead Channel Setting Pacing Amplitude: 2 V
Lead Channel Setting Pacing Amplitude: 2 V
Lead Channel Setting Pacing Pulse Width: 0.5 ms
Lead Channel Setting Pacing Pulse Width: 0.5 ms
Lead Channel Setting Sensing Sensitivity: 0.5 mV
MDC IDC LEAD IMPLANT DT: 20140210
MDC IDC PG IMPLANT DT: 20170222
MDC IDC SESS DTM: 20200225085157
MDC IDC STAT BRADY AS VS PERCENT: 1 %
Pulse Gen Serial Number: 7341405

## 2018-11-10 ENCOUNTER — Other Ambulatory Visit: Payer: Self-pay | Admitting: Internal Medicine

## 2018-11-10 NOTE — Progress Notes (Signed)
EPIC Encounter for ICM Monitoring  Patient Name: Laurie [name] is a 72 y.o. female Date: 11/10/2018 Primary Care Physican: Banks, Shannon R, MD Primary Cardiologist:Munley Electrophysiologist:Klein Bi-V Pacing:>99% Last Weight:192lbs Today's Weight:unknown    Attempted call to patient.  Transmission reviewed.   Report: Thoracic impedance returned to normal but now above baseline suggesting dryness.  Prescribed:Furosemide40 mg take 2 tablets (80 mg total) by mouth every morning and 1 tablet (40 mg total) every evening. Take extra tablet as needed for weight gain of 3 lbs or more.  Labs: 07/12/2018 Creatinine 0.77, BUN 17, Potassium 4.1, Sodium 141, eGFR 78-90  Recommendations:Unable to reach.  Follow-up plan: ICM clinic phone appointment on3/31/2020.   Copy of ICM check sent to Dr.Klein.  3 month ICM trend: 11/07/2018    1 Year ICM trend:        S , RN 11/10/2018 11:48 AM   

## 2018-11-15 ENCOUNTER — Encounter: Payer: Self-pay | Admitting: Cardiology

## 2018-11-23 ENCOUNTER — Other Ambulatory Visit: Payer: Self-pay | Admitting: Family Medicine

## 2018-11-23 NOTE — Telephone Encounter (Signed)
Requested medication (s) are due for refill today: Yes  Requested medication (s) are on the active medication list: Yes  Last refill:  3 years ago by another provider  Future visit scheduled: Yes  Notes to clinic:  Unable to refill per protocol, expired Rx     Requested Prescriptions  Pending Prescriptions Disp Refills   potassium chloride SA (KLOR-CON M20) 20 MEQ tablet 180 tablet 2    Sig: Take 1 tablet (20 mEq total) by mouth daily.     Endocrinology:  Minerals - Potassium Supplementation Passed - 11/23/2018 11:58 AM      Passed - K in normal range and within 360 days    Potassium  Date Value Ref Range Status  07/12/2018 4.1 3.5 - 5.2 mmol/L Final         Passed - Cr in normal range and within 360 days    Creatinine, Ser  Date Value Ref Range Status  07/12/2018 0.77 0.57 - 1.00 mg/dL Final         Passed - Valid encounter within last 12 months    Recent Outpatient Visits          5 months ago Chronic systolic CHF (congestive heart failure) ([place])   [facility] [place], MD   10 months ago Hypertensive heart disease without CHF   [place] [facility], Doretha Sou, MD   1 year ago Need for influenza vaccination   [facility], Doretha Sou, MD   1 year ago Chronic systolic CHF (congestive heart failure) ([place])   [place] at [facility], Doretha Sou, MD   1 year ago Coronary artery disease involving native coronary artery of native heart without angina pectoris   [place] at [facility], Doretha Sou, MD

## 2018-11-23 NOTE — Telephone Encounter (Signed)
Copied from [place] [id number]. Topic: Quick Communication - Rx Refill/Question >> Nov 23, 2018 11:00 AM Waylan Rocher, Lumin L wrote: Medication: potassium chloride SA (K-DUR,KLOR-CON) 20 MEQ tablet [Pharmacy Med Name: Potassium Chloride Crys ER 20 MEQ Oral Tablet Extended Release]   Has the patient contacted their pharmacy? Yes.   (Agent: If no, request that the patient contact the pharmacy for the refill.) (Agent: If yes, when and what did the pharmacy advise?) pharmacy sent to Dr. Inda Merlin pool 11/10/2018 and no one is tending to this pool  Preferred Pharmacy (with phone number or street name): [place], [place] - [address] [place] [address] [place] [address] Phone: [phone] Fax: [phone]  Agent: Please be advised that RX refills may take up to 3 business days. We ask that you follow-up with your pharmacy.  Patient would like to know if not taking this since Febraury 28th 2020? She has a pacemaker.

## 2018-11-23 NOTE — Telephone Encounter (Signed)
Rx filled last on 10/22/2015  by dr Wynonia Lawman, pt LOV was on 06/15/2018, ok to send refill

## 2018-11-24 NOTE — Telephone Encounter (Signed)
Pt called to f/u on RX for Klor-con. She notes she has been trying to refill it for 2 weeks. She said that Dr. Burnice Logan prescribed it for her. Last filled order was for 2/day but she only takes 1/day so it lasted a year. Please advise.   [place], [place] - [address]

## 2018-11-24 NOTE — Telephone Encounter (Signed)
Pt added that she has been out and has a pacemaker and heart problems.

## 2018-11-27 ENCOUNTER — Other Ambulatory Visit: Payer: Self-pay

## 2018-11-27 MED ORDER — POTASSIUM CHLORIDE CRYS ER 20 MEQ PO TBCR: 20.0000 meq | EXTENDED_RELEASE_TABLET | Freq: Every day | ORAL | 2 refills | Status: DC

## 2018-11-28 NOTE — Telephone Encounter (Signed)
Med refilled by pt's cardiologist.

## 2018-11-30 ENCOUNTER — Telehealth: Payer: Self-pay

## 2018-11-30 MED ORDER — PRAVASTATIN SODIUM 40 MG PO TABS: ORAL_TABLET | ORAL | 0 refills | Status: DC

## 2018-11-30 MED ORDER — FUROSEMIDE 40 MG PO TABS: ORAL_TABLET | ORAL | 0 refills | Status: DC

## 2018-11-30 NOTE — Telephone Encounter (Signed)
Contacted pt to clarify dosage and frequency of Pravastatin as refill request was sent by [facility] with different instructions compared to our documentation. Patient confirms that she is taking Pravastatin 40mg  daily. Medication list updated. Refills sent, no further questions.

## 2018-12-04 ENCOUNTER — Other Ambulatory Visit: Payer: Self-pay

## 2018-12-04 MED ORDER — METOPROLOL SUCCINATE ER 50 MG PO TB24: 50.0000 mg | ORAL_TABLET | Freq: Every day | ORAL | 3 refills | Status: DC

## 2018-12-06 ENCOUNTER — Other Ambulatory Visit: Payer: Self-pay

## 2018-12-06 MED ORDER — LISINOPRIL 20 MG PO TABS: 20.0000 mg | ORAL_TABLET | Freq: Every day | ORAL | 2 refills | Status: DC

## 2018-12-06 MED ORDER — CLOPIDOGREL BISULFATE 75 MG PO TABS: 75.0000 mg | ORAL_TABLET | Freq: Every day | ORAL | 1 refills | Status: AC

## 2018-12-07 ENCOUNTER — Ambulatory Visit: Payer: Medicare HMO | Admitting: Endocrinology

## 2018-12-08 ENCOUNTER — Other Ambulatory Visit: Payer: Self-pay

## 2018-12-08 MED ORDER — LISINOPRIL 20 MG PO TABS: 20.0000 mg | ORAL_TABLET | Freq: Every day | ORAL | 1 refills | Status: DC

## 2018-12-12 ENCOUNTER — Other Ambulatory Visit: Payer: Self-pay

## 2018-12-12 ENCOUNTER — Ambulatory Visit (INDEPENDENT_AMBULATORY_CARE_PROVIDER_SITE_OTHER): Payer: Medicare HMO

## 2018-12-12 DIAGNOSIS — I5022 Chronic systolic (congestive) heart failure: Secondary | ICD-10-CM | POA: Diagnosis not present

## 2018-12-12 DIAGNOSIS — Z95810 Presence of automatic (implantable) cardiac defibrillator: Secondary | ICD-10-CM | POA: Diagnosis not present

## 2018-12-15 ENCOUNTER — Telehealth: Payer: Self-pay

## 2018-12-15 NOTE — Progress Notes (Signed)
EPIC Encounter for ICM Monitoring  Patient Name: NIRVANA BLANCHETT is a 73 y.o. female Date: 12/15/2018 Primary Care Physican: Billie Ruddy, MD Primary Cardiologist:Munley Electrophysiologist:Klein Bi-V Pacing:>99% Last Weight:192lbs    Attempted call to patient.  Left message to return call. Transmission reviewed.   Report: Thoracic impedancereturned to normal but now above baseline suggesting dryness.  Prescribed:Furosemide40 mg take 2 tablets (80 mg total) by mouth every morning and 1 tablet (40 mg total) every evening. Take extra tablet as needed for weight gain of 3 lbs or more.  Labs: 07/12/2018 Creatinine 0.77, BUN 17, Potassium 4.1, Sodium 141, GFR 78-90  Recommendations:Unable to reach.  Follow-up plan: ICM clinic phone appointment on5/01/2019.   Copy of ICM check sent to [place].  3 month ICM trend: 12/12/2018    1 Year ICM trend:       [name], RN 12/15/2018 12:31 PM

## 2018-12-15 NOTE — Telephone Encounter (Signed)
Called pt left a message for pt to call the office and schedule a Web Ex visit for her med refill. CRM was created

## 2018-12-15 NOTE — Telephone Encounter (Signed)
Remote ICM transmission received.  Attempted call to patient regarding ICM remote transmission and left message to return call   

## 2019-01-01 ENCOUNTER — Other Ambulatory Visit: Payer: Self-pay

## 2019-01-02 ENCOUNTER — Ambulatory Visit: Payer: Medicare HMO | Admitting: Endocrinology

## 2019-01-02 ENCOUNTER — Encounter: Payer: Medicare HMO | Admitting: Nutrition

## 2019-01-02 ENCOUNTER — Encounter: Payer: Self-pay | Admitting: Endocrinology

## 2019-01-02 ENCOUNTER — Ambulatory Visit (INDEPENDENT_AMBULATORY_CARE_PROVIDER_SITE_OTHER): Payer: Medicare HMO | Admitting: Endocrinology

## 2019-01-02 VITALS — BP 144/70 | HR 91 | Ht 62.0 in | Wt 200.4 lb

## 2019-01-02 DIAGNOSIS — Z794 Long term (current) use of insulin: Secondary | ICD-10-CM | POA: Diagnosis not present

## 2019-01-02 DIAGNOSIS — E1140 Type 2 diabetes mellitus with diabetic neuropathy, unspecified: Secondary | ICD-10-CM | POA: Diagnosis not present

## 2019-01-02 LAB — POCT GLYCOSYLATED HEMOGLOBIN (HGB A1C): Hemoglobin A1C: 9.7 % — AB (ref 4.0–5.6)

## 2019-01-02 MED ORDER — INSULIN REGULAR HUMAN (CONC) 500 UNIT/ML ~~LOC~~ SOPN: PEN_INJECTOR | SUBCUTANEOUS | 11 refills | Status: DC

## 2019-01-02 NOTE — Progress Notes (Signed)
Subjective:    Patient ID: Laurie [name], female    DOB: 1946/07/05, 73 y.o.   MRN: [id number]  HPI  Pt returns for f/u of diabetes mellitus:   DM type: insulin-requiring type 2 Dx'ed: 4825 Complications: polyneuropathy, CVA, and CAD.   Therapy: insulin since 2011.   GDM: 1985.   DKA: never Severe hypoglycemia: never.   Pancreatitis: never.   Other: she has severe insulin resistance; she required very little insulin during a hospitalization in early 2014; she wants to delay weight-loss surgery for now; she takes human insulin, due to cost; she has long duration of action of insulin, and based on pattern of cbg's, does not need basal insulin.    Interval history: She takes the insulin as rx'ed.  no cbg record, but states cbg's vary from 59-200's.  She seldom has hypoglycemia, and these episodes are mild.  She takes U-100 regular insulin, due to cost.  She says a vial lasts approx 1 week Pt also has multinodular goiter (dx'ed 2011; bx then showed HYPERPLASTIC LESION; she has been euthyroid; f/u US in late 2018 was unchanged, and no f/u was advised).   Past Medical History:  Diagnosis Date  . AICD (automatic cardioverter/defibrillator) present   . Bell's palsy 01/05/2010  . CAD (coronary artery disease)    Cath  07/21/1999  mild ostial, 80% stenosis proximal LAD, 90% stenosis proximal Diag 1, 95% stenosis proximal OM 1, 80% stenosis proximal RCA, 95% stenosis mid RCA    CABG 07/22/99  LIMA to LAD, SVG to dx, SVG to OM, SVG to PDA  Dr. [name]   Cardiolite 2012 no ischemia EF 67%  Cath 10/23/12 Severe native three-vessel coronary artery disease with occlusion of all 3 native coronary arteries, Patent sap  . Chronic lower back pain   . Chronic systolic CHF (congestive heart failure) ([place]) 10/22/2015  . Cystitis   . Hepatitis 1970s   "don't know which" (03/17/2017)  . HIstory of Bell's palsy    October 2012   . Hyperlipidemia    Intolerance to several statins   . Hypertensive heart disease  without CHF   . Multinodular goiter   . Obesity (BMI 30-39.9)   . OSA on CPAP    "suppose to wear a mask" (03/17/2017)  . Peripheral neuropathy   . Pneumonia 1990s X 1   "think i had walking pneumonia"  . Seasonal allergies   . Stroke ([place]) 03/2017   "light one"; denies residual on 03/17/2017)  . Type II diabetes mellitus ([place])     Past Surgical History:  Procedure Laterality Date  . BI-VENTRICULAR PACEMAKER UPGRADE  11/05/2015   "upgraded my pacemaker"  . BIOPSY THYROID  05/2010   percutaneous  . CARDIAC CATHETERIZATION N/A 10/21/2015   Procedure: Left Heart Cath and Cors/Grafts Angiography;  Surgeon: Belva Crome, MD;  Location: [facility];  Service: Cardiovascular;  Laterality: N/A;  . CARPAL TUNNEL RELEASE Bilateral   . CATARACT EXTRACTION Right 01/06/2015  . CATARACT EXTRACTION W/ INTRAOCULAR LENS IMPLANT Left 12/16/2014  . CORONARY ARTERY BYPASS GRAFT  2000  . CORONARY STENT INTERVENTION N/A 10/28/2017   Procedure: CORONARY STENT INTERVENTION;  Surgeon: Jettie Booze, MD;  Location: [facility];  Service: Cardiovascular;  Laterality: N/A;  . CYSTECTOMY Right    "middle finger"  . DENTAL IMPLANTS    . EP IMPLANTABLE DEVICE N/A 11/05/2015   Procedure: BiV Upgrade;  Surgeon: Deboraha Sprang, MD;  Location: [facility];  Service: Cardiovascular;  Laterality: N/A;  . INSERT / REPLACE / REMOVE PACEMAKER    . LEAD REVISION N/A 10/25/2012   Procedure: LEAD REVISION;  Surgeon: Evans Lance, MD;  Location: [facility] CATH LAB;  Service: Cardiovascular;  Laterality: N/A;  . LEFT HEART CATH AND CORS/GRAFTS ANGIOGRAPHY N/A 10/28/2017   Procedure: LEFT HEART CATH AND CORS/GRAFTS ANGIOGRAPHY;  Surgeon: Jettie Booze, MD;  Location: [facility];  Service: Cardiovascular;  Laterality: N/A;  . LEFT HEART CATHETERIZATION WITH CORONARY ANGIOGRAM N/A 10/23/2012   Procedure: LEFT HEART CATHETERIZATION WITH CORONARY ANGIOGRAM;  Surgeon: Jacolyn Reedy, MD;  Location: [facility]  CATH LAB;  Service: Cardiovascular;  Laterality: N/A;  . PERMANENT PACEMAKER INSERTION N/A 10/23/2012   Procedure: PERMANENT PACEMAKER INSERTION;  Surgeon: Deboraha Sprang, MD;  Location: [facility] CATH LAB;  Service: Cardiovascular;  Laterality: N/A;  . PLANTAR FASCIA RELEASE Left    "& clipped a tendon that went thru bottom of my foot"  . TEMPORARY PACEMAKER INSERTION N/A 10/22/2012   Procedure: TEMPORARY PACEMAKER INSERTION;  Surgeon: Troy Sine, MD;  Location: [facility] CATH LAB;  Service: Cardiovascular;  Laterality: N/A;  . TUBAL LIGATION      Social History   Socioeconomic History  . Marital status: Married    Spouse name: Not on file  . Number of children: 2  . Years of education: Not on file  . Highest education level: Not on file  Occupational History    Employer: RETIRED  Social Needs  . Financial resource strain: Not on file  . Food insecurity:    Worry: Not on file    Inability: Not on file  . Transportation needs:    Medical: Not on file    Non-medical: Not on file  Tobacco Use  . Smoking status: Never Smoker  . Smokeless tobacco: Never Used  Substance and Sexual Activity  . Alcohol use: No    Alcohol/week: 0.0 standard drinks  . Drug use: No  . Sexual activity: Never  Lifestyle  . Physical activity:    Days per week: Not on file    Minutes per session: Not on file  . Stress: Not on file  Relationships  . Social connections:    Talks on phone: Not on file    Gets together: Not on file    Attends religious service: Not on file    Active member of club or organization: Not on file    Attends meetings of clubs or organizations: Not on file    Relationship status: Not on file  . Intimate partner violence:    Fear of current or ex partner: Not on file    Emotionally abused: Not on file    Physically abused: Not on file    Forced sexual activity: Not on file  Other Topics Concern  . Not on file  Social History Narrative   Does not work outside the home    Current  Outpatient Medications on File Prior to Visit  Medication Sig Dispense Refill  . ACCU-CHEK FASTCLIX LANCETS MISC USE  TO CHECK BLOOD SUGAR FOUR TIMES DAILY 408 each 2  . ACCU-CHEK SMARTVIEW test strip USE  TO CHECK BLOOD SUGAR FOUR TIMES DAILY 400 each 1  . Alcohol Swabs (B-D SINGLE USE SWABS REGULAR) PADS Use 8 daily for testing blood sugar and insulin injections. 800 each 1  . aspirin 81 MG tablet Take 1 tablet (81 mg total) by mouth daily. 30 tablet 11  . BD VEO INSULIN  SYRINGE U/F 31G X 15/64" 1 ML MISC USE 10 TIMES A DAY WITH INSULIN AS DIRECTED 300 each 0  . beta carotene w/minerals (OCUVITE) tablet Take 1 tablet by mouth daily.    . Blood Glucose Calibration (ACCU-CHEK SMARTVIEW CONTROL) LIQD Use to calibrate blood glucose meter. 3 each 1  . cetirizine (ZYRTEC) 10 MG tablet Take 1 tablet (10 mg total) by mouth daily.    . cholecalciferol (VITAMIN D) 1000 UNITS tablet Take 1,000 Units by mouth daily.     . clopidogrel (PLAVIX) 75 MG tablet Take 1 tablet (75 mg total) by mouth daily. 90 tablet 1  . Continuous Blood Gluc Sensor (FREESTYLE LIBRE 14 DAY SENSOR) MISC APPLY EVERY 14 DAYS 2 each 0  . furosemide (LASIX) 40 MG tablet TAKE 2 TABLETS BY MOUTH EVERY MORNING AND 1 TABLET EVERY EVENING. TAKE 1 ADDITIONAL TABLET FOR WEIGHT GAIN OF 3LBS OR MORE 360 tablet 0  . GLUCOSAMINE-CHONDROITIN PO Take 1 tablet by mouth daily.    [name] ketoconazole (NIZORAL) 2 % cream Apply 1 application topically daily as needed for irritation. TO AFFECTED AREAS OF FACE     . lisinopril (PRINIVIL,ZESTRIL) 20 MG tablet Take 1 tablet (20 mg total) by mouth daily. 90 tablet 1  . metoprolol succinate (TOPROL-XL) 50 MG 24 hr tablet Take 1 tablet (50 mg total) by mouth daily. Take with or immediately following a meal. 90 tablet 3  . Multiple Vitamins-Minerals (MULTIVITAMIN WITH MINERALS) tablet Take 1 tablet by mouth daily.    . nitroGLYCERIN (NITROSTAT) 0.4 MG SL tablet Place 0.4 mg under the tongue every 5 (five) minutes as  needed for chest pain.     . potassium chloride SA (KLOR-CON M20) 20 MEQ tablet Take 1 tablet (20 mEq total) by mouth daily. 180 tablet 2  . pravastatin (PRAVACHOL) 40 MG tablet TAKE 1 TABLET BY MOUTH ONCE DAILY 6 IN THE EVENING 90 tablet 0  . Probiotic Product (PROBIOTIC DAILY) CAPS Take 1 capsule by mouth daily.    . psyllium (METAMUCIL) 58.6 % powder Take 1 packet by mouth daily as needed (fiber).     . vitamin C (ASCORBIC ACID) 500 MG tablet Take 500 mg by mouth every other day.     No current facility-administered medications on file prior to visit.     Allergies  Allergen Reactions  . Brilinta [Ticagrelor] Shortness Of Breath  . Statins Other (See Comments)    Statins make the legs ache  . Tricor [Fenofibrate] Other (See Comments)    Leg pain  . Latex Itching and Other (See Comments)    Affected areas turn red, also-  . Tape Rash    Heart leads break out the skin    Family History  Problem Relation Age of Onset  . Diabetes Father   . Heart disease Father   . Diabetes Mother   . Diabetes Other        Siblings and 2 children  . Heart disease Other        CAD  . Colon cancer Neg Hx   . Colon polyps Neg Hx     BP (!) 144/70 (BP Location: Left Arm, Patient Position: Sitting, Cuff Size: Large)   Pulse 91   Ht 5\' 2"  (1.575 m)   Wt 200 lb 6.4 oz (90.9 kg)   SpO2 94%   BMI 36.65 kg/m   Review of Systems Denies LOC    Objective:   Physical Exam VITAL SIGNS:  See vs page GENERAL: no  distress Pulses: dorsalis pedis intact bilat.   MSK: no deformity of the feet CV: no leg edema Skin:  no ulcer on the feet.  normal color and temp on the feet. Neuro: sensation is intact to touch on the feet  Lab Results  Component Value Date   HGBA1C 9.7 (A) 01/02/2019      Assessment & Plan:  Insulin-requiring type 2 DM, with CAD: she needs increased rx.   Insulin dosing error, new. Pt will go over this with CDE today.   HTN: is noted today.    Patient Instructions  Your  blood pressure is high today.  Please see your primary care provider soon, to have it recheckedcheck your blood sugar 4 times a day--before the 3 meals, and at bedtime (the bedtime reading is important, as we may need to increase the suppertime insulin).  also check if you have symptoms of your blood sugar being too high or too low.  please keep a record of the readings and bring it to your next appointment here.  please call us sooner if you are having low blood sugar episodes.  Please change the insulin to "U-500," 230 at breakfast, 240 at lunch, and 200 at dinner.   I have sent a prescription to your pharmacy.   Eat a light snack at bedtime, to avoid the sugar going low in the morning.  Please come back for a follow-up appointment in 2 months.

## 2019-01-02 NOTE — Patient Instructions (Addendum)
Your blood pressure is high today.  Please see your primary care provider soon, to have it recheckedcheck your blood sugar 4 times a day--before the 3 meals, and at bedtime (the bedtime reading is important, as we may need to increase the suppertime insulin).  also check if you have symptoms of your blood sugar being too high or too low.  please keep a record of the readings and bring it to your next appointment here.  please call us sooner if you are having low blood sugar episodes.  Please change the insulin to "U-500," 230 at breakfast, 240 at lunch, and 200 at dinner.   I have sent a prescription to your pharmacy.   Eat a light snack at bedtime, to avoid the sugar going low in the morning.  Please come back for a follow-up appointment in 2 months.

## 2019-01-16 ENCOUNTER — Ambulatory Visit (INDEPENDENT_AMBULATORY_CARE_PROVIDER_SITE_OTHER): Payer: Medicare HMO

## 2019-01-16 ENCOUNTER — Other Ambulatory Visit: Payer: Self-pay

## 2019-01-16 DIAGNOSIS — I5022 Chronic systolic (congestive) heart failure: Secondary | ICD-10-CM | POA: Diagnosis not present

## 2019-01-16 DIAGNOSIS — Z95810 Presence of automatic (implantable) cardiac defibrillator: Secondary | ICD-10-CM

## 2019-01-17 ENCOUNTER — Other Ambulatory Visit: Payer: Self-pay

## 2019-01-17 DIAGNOSIS — E1140 Type 2 diabetes mellitus with diabetic neuropathy, unspecified: Secondary | ICD-10-CM

## 2019-01-17 DIAGNOSIS — Z794 Long term (current) use of insulin: Principal | ICD-10-CM

## 2019-01-17 MED ORDER — INSULIN PEN NEEDLE 32G X 4 MM MISC: 1.0000 | Freq: Three times a day (TID) | 2 refills | Status: AC

## 2019-01-19 ENCOUNTER — Telehealth: Payer: Self-pay

## 2019-01-19 NOTE — Telephone Encounter (Signed)
Remote ICM transmission received.  Attempted call to patient regarding ICM remote transmission and no answer.  

## 2019-01-19 NOTE — Progress Notes (Signed)
EPIC Encounter for ICM Monitoring  Patient Name: Laurie [name] is a 73 y.o. female Date: 01/19/2019 Primary Care Physican: Billie Ruddy, MD Primary Cardiologist:Munley Electrophysiologist:Klein Bi-V Pacing:>99% 01/19/2019 Weight:200lbs   Transmission reviewed.  Spoke with patient.  She forgot to take Furosemide for a few days and had swelling in her feet during decreased impedance.  Swelling has resolved and she is back on track taking Furosemide.   CorVue Thoracic impedance normal but was abnormal suggesting fluid accumulation from 01/11/2019 to 01/15/2019.  Prescribed:Furosemide40 mg take 2 tablets (80 mg total) by mouth every morning and 1 tablet (40 mg total) every evening. Take extra tablet as needed for weight gain of 3 lbs or more.  Labs: 07/12/2018 Creatinine 0.77, BUN 17, Potassium 4.1, Sodium 141, GFR 78-90  Recommendations: Advised to limit salt intake and encouraged to call if develops fluid symptoms.  Follow-up plan: ICM clinic phone appointment on6/04/2019.   Copy of ICM check sent to [place].   3 month ICM trend: 01/16/2019    1 Year ICM trend:       [name], RN 01/19/2019 2:43 PM

## 2019-01-29 ENCOUNTER — Telehealth: Payer: Self-pay

## 2019-01-29 NOTE — Telephone Encounter (Signed)
Attempted to call pt with no success, LVM for pt to return call in the office regarding her medication refills, not sure what medications need refill.CRM created

## 2019-01-30 ENCOUNTER — Other Ambulatory Visit: Payer: Self-pay

## 2019-01-30 ENCOUNTER — Telehealth: Payer: Self-pay

## 2019-01-30 MED ORDER — FUROSEMIDE 40 MG PO TABS: ORAL_TABLET | ORAL | 0 refills | Status: DC

## 2019-01-30 MED ORDER — PRAVASTATIN SODIUM 40 MG PO TABS: ORAL_TABLET | ORAL | 1 refills | Status: DC

## 2019-01-30 NOTE — Telephone Encounter (Signed)
Spoke with Laurie [name] with [facility] pharmacy, confirmed that pt prescriptions are current with refills, stated that pt could use more refills on Lasix and Pravastatin which had no refills left. Rx sent to [facility] for refills.

## 2019-02-06 ENCOUNTER — Ambulatory Visit (INDEPENDENT_AMBULATORY_CARE_PROVIDER_SITE_OTHER): Payer: Medicare HMO | Admitting: *Deleted

## 2019-02-06 DIAGNOSIS — I5022 Chronic systolic (congestive) heart failure: Secondary | ICD-10-CM

## 2019-02-06 DIAGNOSIS — I429 Cardiomyopathy, unspecified: Secondary | ICD-10-CM

## 2019-02-06 LAB — CUP PACEART REMOTE DEVICE CHECK
Date Time Interrogation Session: 20200526145626
Implantable Lead Implant Date: 20140210
Implantable Lead Implant Date: 20170222
Implantable Lead Implant Date: 20170222
Implantable Lead Location: 753858
Implantable Lead Location: 753859
Implantable Lead Location: 753860
Implantable Lead Model: 4298
Implantable Lead Model: 5076
Implantable Lead Model: 7122
Implantable Pulse Generator Implant Date: 20170222
Pulse Gen Serial Number: 7341405

## 2019-02-16 ENCOUNTER — Other Ambulatory Visit: Payer: Self-pay

## 2019-02-19 ENCOUNTER — Ambulatory Visit (INDEPENDENT_AMBULATORY_CARE_PROVIDER_SITE_OTHER): Payer: Medicare HMO

## 2019-02-19 DIAGNOSIS — Z95810 Presence of automatic (implantable) cardiac defibrillator: Secondary | ICD-10-CM | POA: Diagnosis not present

## 2019-02-19 DIAGNOSIS — I5022 Chronic systolic (congestive) heart failure: Secondary | ICD-10-CM

## 2019-02-19 NOTE — Progress Notes (Signed)
Remote ICD transmission.   

## 2019-02-20 ENCOUNTER — Ambulatory Visit: Payer: Medicare HMO | Admitting: Endocrinology

## 2019-02-23 NOTE — Progress Notes (Signed)
EPIC Encounter for ICM Monitoring  Patient Name: Laurie [name] is a 73 y.o. female Date: 02/23/2019 Primary Care Physican: Billie Ruddy, MD Primary Cardiologist:Munley Electrophysiologist:Klein Bi-V Pacing:>99% 01/19/2019 Weight:200lbs   Transmission reviewed and results sent via my chart  CorVue Thoracic impedance normal.  Prescribed:Furosemide40 mg take 2 tablets (80 mg total) by mouth every morning and 1 tablet (40 mg total) every evening. Take extra tablet as needed for weight gain of 3 lbs or more.  Labs: 07/12/2018 Creatinine 0.77, BUN 17, Potassium 4.1, Sodium 141, GFR 78-90  Recommendations: Recommended to limit salt intake and encouraged to call if develops fluid symptoms.  Follow-up plan: ICM clinic phone appointment on 03/26/2019.   Copy of ICM check sent to Laurie [name].   3 month ICM trend: 02/19/2019    1 Year ICM trend:       Laurie Billings, RN 02/23/2019 10:36 AM

## 2019-02-28 DIAGNOSIS — E1151 Type 2 diabetes mellitus with diabetic peripheral angiopathy without gangrene: Secondary | ICD-10-CM | POA: Diagnosis not present

## 2019-02-28 DIAGNOSIS — I509 Heart failure, unspecified: Secondary | ICD-10-CM | POA: Diagnosis not present

## 2019-02-28 DIAGNOSIS — I4821 Permanent atrial fibrillation: Secondary | ICD-10-CM | POA: Diagnosis not present

## 2019-02-28 DIAGNOSIS — M545 Low back pain: Secondary | ICD-10-CM | POA: Diagnosis not present

## 2019-02-28 DIAGNOSIS — I119 Hypertensive heart disease without heart failure: Secondary | ICD-10-CM | POA: Diagnosis not present

## 2019-02-28 DIAGNOSIS — I2510 Atherosclerotic heart disease of native coronary artery without angina pectoris: Secondary | ICD-10-CM | POA: Diagnosis not present

## 2019-02-28 DIAGNOSIS — Z1331 Encounter for screening for depression: Secondary | ICD-10-CM | POA: Diagnosis not present

## 2019-02-28 DIAGNOSIS — Z95810 Presence of automatic (implantable) cardiac defibrillator: Secondary | ICD-10-CM | POA: Diagnosis not present

## 2019-02-28 DIAGNOSIS — E7800 Pure hypercholesterolemia, unspecified: Secondary | ICD-10-CM | POA: Diagnosis not present

## 2019-02-28 DIAGNOSIS — E1165 Type 2 diabetes mellitus with hyperglycemia: Secondary | ICD-10-CM | POA: Diagnosis not present

## 2019-03-26 ENCOUNTER — Ambulatory Visit (INDEPENDENT_AMBULATORY_CARE_PROVIDER_SITE_OTHER): Payer: Medicare HMO

## 2019-03-26 DIAGNOSIS — Z95810 Presence of automatic (implantable) cardiac defibrillator: Secondary | ICD-10-CM

## 2019-03-26 DIAGNOSIS — I5022 Chronic systolic (congestive) heart failure: Secondary | ICD-10-CM

## 2019-03-30 ENCOUNTER — Telehealth: Payer: Self-pay

## 2019-03-30 NOTE — Telephone Encounter (Signed)
Remote ICM transmission received.  Attempted call to patient regarding ICM remote transmission and no answer.  

## 2019-03-30 NOTE — Progress Notes (Signed)
EPIC Encounter for ICM Monitoring  Patient Name: Laurie [name] is a 73 y.o. female Date: 03/30/2019 Primary Care Physican: Billie Ruddy, MD Primary Cardiologist:Munley Electrophysiologist:Klein Bi-V Pacing:>99% 5/8/2020Weight:200lbs   Attempted call to patient and unable to reach. Transmission reviewed and results sent via my chart since unable to reach by phone.  CorVueThoracic impedance normal.  Prescribed:Furosemide40 mg take 2 tablets (80 mg total) by mouth every morning and 1 tablet (40 mg total) every evening. Take extra tablet as needed for weight gain of 3 lbs or more.  Labs: 07/12/2018 Creatinine 0.77, BUN 17, Potassium 4.1, Sodium 141, GFR 78-90  Recommendations:Recommended to limit salt intake and encouraged to call if develops fluid symptoms.  Follow-up plan: ICM clinic phone appointment on 05/08/2019.   Copy of ICM check sent to [place].  3 month ICM trend: 03/26/2019    1 Year ICM trend:       [name], RN 03/30/2019 8:46 AM

## 2019-04-10 ENCOUNTER — Telehealth: Payer: Self-pay | Admitting: Endocrinology

## 2019-04-10 NOTE — Telephone Encounter (Signed)
Please provide the dosage

## 2019-04-10 NOTE — Telephone Encounter (Signed)
Yes, you can use up your humulin reg

## 2019-04-10 NOTE — Telephone Encounter (Signed)
Patient ph# [phone] called re: Patient states that Dr. Loanne Drilling switched patient to UK500 with 200,230 &240 units. Patient filled out forms for [employer] for the above medication and faxed the forms to [place]. Patient has not heard back to find out if she is qualified for discounted medication. Patient went to [facility] to fill the above RX who told patient the cost is $1,000.00 Patient was told cost would be $ 49.00. Pharmacist told patient she would need to have a discount/special card in order for the cost to be $49.00. Patient still has Novalin R that is in the bottle and wants to know if she could use the Novolin since she is out of Humalin R500. Please call patient at the ph# listed above to advise.

## 2019-04-10 NOTE — Telephone Encounter (Signed)
Please continue the same insulin.  

## 2019-04-10 NOTE — Telephone Encounter (Signed)
In terms of PAP [employer]), it would be the pt responsibility to follow up on the status of her her approval. Please advise re: discount card, cost and new orders for other insulin she has at her disposal.

## 2019-04-11 NOTE — Telephone Encounter (Signed)
LVM requesting returned call 

## 2019-04-11 NOTE — Telephone Encounter (Signed)
SECOND ATTEMPT: ° °LVM requesting returned call. °

## 2019-04-12 NOTE — Telephone Encounter (Signed)
FINAL ATTEMPT:  LVM requesting returned call 

## 2019-04-12 NOTE — Telephone Encounter (Signed)
Pt FINALLY returned call. Has been informed of Dr. Cordelia Pen orders. However, given LOV, pt is overdue for an appt. In light of all her concerns, appt has been scheduled to address tomorrow, (04/13/19)

## 2019-04-13 ENCOUNTER — Other Ambulatory Visit: Payer: Self-pay

## 2019-04-13 ENCOUNTER — Telehealth: Payer: Self-pay

## 2019-04-13 ENCOUNTER — Ambulatory Visit (INDEPENDENT_AMBULATORY_CARE_PROVIDER_SITE_OTHER): Payer: Medicare HMO | Admitting: Endocrinology

## 2019-04-13 ENCOUNTER — Encounter: Payer: Self-pay | Admitting: Endocrinology

## 2019-04-13 VITALS — BP 138/70 | HR 94 | Ht 62.0 in | Wt 196.8 lb

## 2019-04-13 DIAGNOSIS — Z794 Long term (current) use of insulin: Secondary | ICD-10-CM | POA: Diagnosis not present

## 2019-04-13 DIAGNOSIS — E1140 Type 2 diabetes mellitus with diabetic neuropathy, unspecified: Secondary | ICD-10-CM

## 2019-04-13 LAB — POCT GLYCOSYLATED HEMOGLOBIN (HGB A1C): Hemoglobin A1C: 9.8 % — AB (ref 4.0–5.6)

## 2019-04-13 MED ORDER — TOUJEO MAX SOLOSTAR 300 UNIT/ML ~~LOC~~ SOPN: 600.0000 [IU] | PEN_INJECTOR | SUBCUTANEOUS | 11 refills | Status: DC

## 2019-04-13 NOTE — Patient Instructions (Addendum)
Your blood pressure is high today.  Please see your primary care provider soon, to have it recheckedcheck your blood sugar 4 times a day--before the 3 meals, and at bedtime (the bedtime reading is important, as we may need to increase the suppertime insulin).  also check if you have symptoms of your blood sugar being too high or too low.  please keep a record of the readings and bring it to your next appointment here.  please call us sooner if you are having low blood sugar episodes.  Please change the insulin to "Toujeo," 600 units each morning. On this type of insulin schedule, you should eat meals on a regular schedule.  If a meal is missed or significantly delayed, your blood sugar could go low. Eat a light snack at bedtime, to avoid the sugar going low in the morning.  Please come back for a follow-up appointment in 2 months.

## 2019-04-13 NOTE — Progress Notes (Signed)
Subjective:    Patient ID: Laurie [name], female    DOB: 12-Feb-1946, 73 y.o.   MRN: [id number]  HPI Pt returns for f/u of diabetes mellitus:   DM type: insulin-requiring type 2 Dx'ed: 1245 Complications: polyneuropathy, CVA, and CAD.   Therapy: insulin since 2011.   GDM: 1985.   DKA: never Severe hypoglycemia: never.   Pancreatitis: never.   Other: she has severe insulin resistance; she required very little insulin during a hospitalization in early 2014; she wants to delay weight-loss surgery for now; she takes human insulin, due to cost; she has long duration of action of insulin, and based on pattern of cbg's, does not need basal insulin.    Interval history: She takes the insulin as rx'ed.  no cbg record, but states cbg's vary from 150-200's.  She seldom has hypoglycemia, and these episodes are mild.  She takes U-100 regular insulin, due to cost.  Pt also has multinodular goiter (dx'ed 2011; bx then showed HYPERPLASTIC LESION; she has been euthyroid; f/u US in late 2018 was unchanged, and no f/u was advised).   Past Medical History:  Diagnosis Date  . AICD (automatic cardioverter/defibrillator) present   . Bell's palsy 01/05/2010  . CAD (coronary artery disease)    Cath  07/21/1999  mild ostial, 80% stenosis proximal LAD, 90% stenosis proximal Diag 1, 95% stenosis proximal OM 1, 80% stenosis proximal RCA, 95% stenosis mid RCA    CABG 07/22/99  LIMA to LAD, SVG to dx, SVG to OM, SVG to PDA  Dr. [name]   Cardiolite 2012 no ischemia EF 67%  Cath 10/23/12 Severe native three-vessel coronary artery disease with occlusion of all 3 native coronary arteries, Patent sap  . Chronic lower back pain   . Chronic systolic CHF (congestive heart failure) ([place]) 10/22/2015  . Cystitis   . Hepatitis 1970s   "don't know which" (03/17/2017)  . HIstory of Bell's palsy    October 2012   . Hyperlipidemia    Intolerance to several statins   . Hypertensive heart disease without CHF   . Multinodular goiter    . Obesity (BMI 30-39.9)   . OSA on CPAP    "suppose to wear a mask" (03/17/2017)  . Peripheral neuropathy   . Pneumonia 1990s X 1   "think i had walking pneumonia"  . Seasonal allergies   . Stroke ([place]) 03/2017   "light one"; denies residual on 03/17/2017)  . Type II diabetes mellitus ([place])     Past Surgical History:  Procedure Laterality Date  . BI-VENTRICULAR PACEMAKER UPGRADE  11/05/2015   "upgraded my pacemaker"  . BIOPSY THYROID  05/2010   percutaneous  . CARDIAC CATHETERIZATION N/A 10/21/2015   Procedure: Left Heart Cath and Cors/Grafts Angiography;  Surgeon: Belva Crome, MD;  Location: [facility];  Service: Cardiovascular;  Laterality: N/A;  . CARPAL TUNNEL RELEASE Bilateral   . CATARACT EXTRACTION Right 01/06/2015  . CATARACT EXTRACTION W/ INTRAOCULAR LENS IMPLANT Left 12/16/2014  . CORONARY ARTERY BYPASS GRAFT  2000  . CORONARY STENT INTERVENTION N/A 10/28/2017   Procedure: CORONARY STENT INTERVENTION;  Surgeon: Jettie Booze, MD;  Location: [facility];  Service: Cardiovascular;  Laterality: N/A;  . CYSTECTOMY Right    "middle finger"  . DENTAL IMPLANTS    . EP IMPLANTABLE DEVICE N/A 11/05/2015   Procedure: BiV Upgrade;  Surgeon: Deboraha Sprang, MD;  Location: [facility];  Service: Cardiovascular;  Laterality: N/A;  . INSERT /  REPLACE / REMOVE PACEMAKER    . LEAD REVISION N/A 10/25/2012   Procedure: LEAD REVISION;  Surgeon: Evans Lance, MD;  Location: [facility] CATH LAB;  Service: Cardiovascular;  Laterality: N/A;  . LEFT HEART CATH AND CORS/GRAFTS ANGIOGRAPHY N/A 10/28/2017   Procedure: LEFT HEART CATH AND CORS/GRAFTS ANGIOGRAPHY;  Surgeon: Jettie Booze, MD;  Location: [facility];  Service: Cardiovascular;  Laterality: N/A;  . LEFT HEART CATHETERIZATION WITH CORONARY ANGIOGRAM N/A 10/23/2012   Procedure: LEFT HEART CATHETERIZATION WITH CORONARY ANGIOGRAM;  Surgeon: Jacolyn Reedy, MD;  Location: [facility] C[facility] LAB;  Service: Cardiovascular;   Laterality: N/A;  . PERMANENT PACEMAKER INSERTION N/A 10/23/2012   Procedure: PERMANENT PACEMAKER INSERTION;  Surgeon: Deboraha Sprang, MD;  Location: [facility] C[facility] LAB;  Service: Cardiovascular;  Laterality: N/A;  . PLANTAR FASCIA RELEASE Left    "& clipped a tendon that went thru bottom of my foot"  . TEMPORARY PACEMAKER INSERTION N/A 10/22/2012   Procedure: TEMPORARY PACEMAKER INSERTION;  Surgeon: Troy Sine, MD;  Location: [facility] CATH LAB;  Service: Cardiovascular;  Laterality: N/A;  . TUBAL LIGATION      Social History   Socioeconomic History  . Marital status: Married    Spouse name: Not on file  . Number of children: 2  . Years of education: Not on file  . Highest education level: Not on file  Occupational History    Employer: RETIRED  Social Needs  . Financial resource strain: Not on file  . Food insecurity    Worry: Not on file    Inability: Not on file  . Transportation needs    Medical: Not on file    Non-medical: Not on file  Tobacco Use  . Smoking status: Never Smoker  . Smokeless tobacco: Never Used  Substance and Sexual Activity  . Alcohol use: No    Alcohol/week: 0.0 standard drinks  . Drug use: No  . Sexual activity: Never  Lifestyle  . Physical activity    Days per week: Not on file    Minutes per session: Not on file  . Stress: Not on file  Relationships  . Social Herbalist on phone: Not on file    Gets together: Not on file    Attends religious service: Not on file    Active member of club or organization: Not on file    Attends meetings of clubs or organizations: Not on file    Relationship status: Not on file  . Intimate partner violence    Fear of current or ex partner: Not on file    Emotionally abused: Not on file    Physically abused: Not on file    Forced sexual activity: Not on file  Other Topics Concern  . Not on file  Social History Narrative   Does not work outside the home    Current Outpatient Medications on File Prior to  Visit  Medication Sig Dispense Refill  . ACCU-CHEK FASTCLIX LANCETS MISC USE  TO CHECK BLOOD SUGAR FOUR TIMES DAILY (Patient taking differently: 1 each by Other route 3 (three) times daily. ) 408 each 2  . ACCU-CHEK SMARTVIEW test strip USE  TO CHECK BLOOD SUGAR FOUR TIMES DAILY (Patient taking differently: 1 each by Other route 3 (three) times daily. USE  TO CHECK BLOOD SUGAR THREE TIMES DAILY) 400 each 1  . Alcohol Swabs (B-D SINGLE USE SWABS REGULAR) PADS Use 8 daily for testing blood sugar and insulin injections. [place]  each 1  . aspirin 81 MG tablet Take 1 tablet (81 mg total) by mouth daily. 30 tablet 11  . BD VEO INSULIN SYRINGE U/F 31G X 15/64" 1 ML MISC USE 10 TIMES A DAY WITH INSULIN AS DIRECTED 300 each 0  . beta carotene w/minerals (OCUVITE) tablet Take 1 tablet by mouth daily.    . Blood Glucose Calibration (ACCU-CHEK SMARTVIEW CONTROL) LIQD Use to calibrate blood glucose meter. 3 each 1  . cetirizine (ZYRTEC) 10 MG tablet Take 1 tablet (10 mg total) by mouth daily.    . cholecalciferol (VITAMIN D) 1000 UNITS tablet Take 1,000 Units by mouth daily.     . clopidogrel (PLAVIX) 75 MG tablet Take 1 tablet (75 mg total) by mouth daily. 90 tablet 1  . Continuous Blood Gluc Sensor (FREESTYLE LIBRE 14 DAY SENSOR) MISC APPLY EVERY 14 DAYS 2 each 0  . furosemide (LASIX) 40 MG tablet TAKE 2 TABLETS BY MOUTH EVERY MORNING AND 1 TABLET EVERY EVENING. TAKE 1 ADDITIONAL TABLET FOR WEIGHT GAIN OF 3LBS OR MORE 360 tablet 0  . GLUCOSAMINE-CHONDROITIN PO Take 1 tablet by mouth daily.    . Insulin Pen Needle (BD PEN NEEDLE NANO U/F) 32G X 4 MM MISC 1 each by Does not apply route 3 (three) times daily. Use to inject insulin TID; E11.40 100 each 2  . ketoconazole (NIZORAL) 2 % cream Apply 1 application topically daily as needed for irritation. TO AFFECTED AREAS OF FACE     . lisinopril (PRINIVIL,ZESTRIL) 20 MG tablet Take 1 tablet (20 mg total) by mouth daily. 90 tablet 1  . metoprolol succinate (TOPROL-XL) 50  MG 24 hr tablet Take 1 tablet (50 mg total) by mouth daily. Take with or immediately following a meal. 90 tablet 3  . Multiple Vitamins-Minerals (MULTIVITAMIN WITH MINERALS) tablet Take 1 tablet by mouth daily.    . nitroGLYCERIN (NITROSTAT) 0.4 MG SL tablet Place 0.4 mg under the tongue every 5 (five) minutes as needed for chest pain.     . potassium chloride SA (KLOR-CON M20) 20 MEQ tablet Take 1 tablet (20 mEq total) by mouth daily. 180 tablet 2  . pravastatin (PRAVACHOL) 40 MG tablet TAKE 1 TABLET BY MOUTH ONCE DAILY 6 IN THE EVENING 90 tablet 1  . Probiotic Product (PROBIOTIC DAILY) CAPS Take 1 capsule by mouth daily.    . psyllium (METAMUCIL) 58.6 % powder Take 1 packet by mouth daily as needed (fiber).     . vitamin C (ASCORBIC ACID) 500 MG tablet Take 500 mg by mouth every other day.     No current facility-administered medications on file prior to visit.     Allergies  Allergen Reactions  . Brilinta [Ticagrelor] Shortness Of Breath  . Statins Other (See Comments)    Statins make the legs ache  . Tricor [Fenofibrate] Other (See Comments)    Leg pain  . Latex Itching and Other (See Comments)    Affected areas turn red, also-  . Tape Rash    Heart leads break out the skin    Family History  Problem Relation Age of Onset  . Diabetes Father   . Heart disease Father   . Diabetes Mother   . Diabetes Other        Siblings and 2 children  . Heart disease Other        CAD  . Colon cancer Neg Hx   . Colon polyps Neg Hx     BP 138/70 (BP Location: Left  Arm, Patient Position: Sitting, Cuff Size: Large)   Pulse 94   Ht 5\' 2"  (1.575 m)   Wt 196 lb 12.8 oz (89.3 kg)   SpO2 97%   BMI 36.00 kg/m    Review of Systems She denies hypoglycemia.      Objective:   Physical Exam VITAL SIGNS:  See vs page GENERAL: no distress Pulses: dorsalis pedis intact bilat.   MSK: no deformity of the feet CV: trace bilat leg edema Skin:  no ulcer on the feet.  normal color and temp on the  feet.  Old healed surgical scar on the right leg.   Neuro: sensation is intact to touch on the feet.   Ext: there is bilateral onychomycosis of the toenails.    A1c=9.8%  Lab Results  Component Value Date   CREATININE 0.77 07/12/2018   BUN 17 07/12/2018   NA 141 07/12/2018   K 4.1 07/12/2018   CL 102 07/12/2018   CO2 22 07/12/2018       Assessment & Plan:  HTN: is noted today Insulin-requiring type 2 DM, with CVA: worse CAD: in this setting, she she avoid hypoglycemia, so we'll dose the toujeo conservatively, compared to current dosage.   Patient Instructions  Your blood pressure is high today.  Please see your primary care provider soon, to have it recheckedcheck your blood sugar 4 times a day--before the 3 meals, and at bedtime (the bedtime reading is important, as we may need to increase the suppertime insulin).  also check if you have symptoms of your blood sugar being too high or too low.  please keep a record of the readings and bring it to your next appointment here.  please call us sooner if you are having low blood sugar episodes.  Please change the insulin to "Toujeo," 600 units each morning. On this type of insulin schedule, you should eat meals on a regular schedule.  If a meal is missed or significantly delayed, your blood sugar could go low. Eat a light snack at bedtime, to avoid the sugar going low in the morning.  Please come back for a follow-up appointment in 2 months.

## 2019-04-13 NOTE — Telephone Encounter (Signed)
Pt was given her portion of the [employer] application and advised to complete and return along with financials. Pt informed that Rx for Toujeo Max/[employer] will be held pending receipt of her completed documents. Pt expressed complete understanding that the application cannot be submitted to [employer] without all completed documents and her financials. Rx being held in nurse's pending box until pt returns docs.

## 2019-04-17 NOTE — Telephone Encounter (Signed)
Pt called with confusion about the following:  1.  [employer] Forms = Toujeo 2.  [employer] = unknown medical professional provided pt with these forms and NOT our office. Unknown professional provided no further instructions resulting in pt confusion about what PAP she is applying for and what medication is associated with that particular form 3.  Regular insulin = "how long do I stay on this medication and when do I start Toujeo" 4.  U500 - "Is this for [employer] or [employer]?"(this was never restarted by Dr. Loanne Drilling therefore there are no orders associated with this medication from Dr. Loanne Drilling)  Pt was provided the following reminders as according to her 04/13/19 visit:  1.  [employer] forms - MUST return completed forms. These forms are for Toujeo. She has not yet started Toujeo because it is dependant upon whether she is approved through [employer]'s PAP. 2.  [employer] - disregard these forms and anything told to her by the UNKNOWN person providing misleading information. These are not associated with any orders from Dr. Loanne Drilling at this time NOR to be signed by Dr. Loanne Drilling at this time 3.  Regular insulin - Will REMAIN on regular insulin purchased at [facility] until she returns [employer] forms. If approved through [employer], she will begin [facility] Max according to Dr. Cordelia Pen orders. Pt aware this process remains on hold until she returns the [employer] application to our office. 4.  U500 - Reminded pt that this was never restarted by Dr. Loanne Drilling therefore there are no orders associated with this medication from Dr. Loanne Drilling  Pt verbalized acceptance and understanding of all information provided.

## 2019-04-30 ENCOUNTER — Telehealth: Payer: Self-pay

## 2019-04-30 NOTE — Telephone Encounter (Signed)
On 04/12/19, Dr. Loanne Drilling completed physician orders for pt to receive financial assistance with [facility]. At that time, pt was given her portion of the [employer] application to complete and advised to return. To date, pt has not returned her portion of the application. Should pt wish to pursue financial assistance for her insulin, new physician orders will need to be completed.

## 2019-05-08 ENCOUNTER — Ambulatory Visit (INDEPENDENT_AMBULATORY_CARE_PROVIDER_SITE_OTHER): Payer: Medicare HMO

## 2019-05-08 DIAGNOSIS — I5022 Chronic systolic (congestive) heart failure: Secondary | ICD-10-CM

## 2019-05-08 DIAGNOSIS — Z95810 Presence of automatic (implantable) cardiac defibrillator: Secondary | ICD-10-CM | POA: Diagnosis not present

## 2019-05-08 NOTE — Progress Notes (Signed)
EPIC Encounter for ICM Monitoring  Patient Name: Laurie [name] is a 73 y.o. female Date: 05/08/2019 Primary Care Physican: Tisovec, Fransico Him, MD Primary Cardiologist:Munley Electrophysiologist:Klein Bi-V Pacing:>99% 8/25/2020Weight:196lbs   Spoke with patient.  She is asymptomatic.  She does not take Furosemide consistently as prescribed due to either she has errands to run or she gives her self a break from urinating so much.  Advised to take consistently as prescribed to help eliminate fluid accumulation.    CorVueThoracic impedance suggesting possible fluid accumulation starting 05/06/2019.  Impedance also suggested possible fluid accumulation at intervals during 8/6-8/18.  Prescribed:Furosemide40 mg take 2 tablets (80 mg total) by mouth every morning and 1 tablet (40 mg total) every evening. Take extra tablet as needed for weight gain of 3 lbs or more.  Labs: 07/12/2018 Creatinine 0.77, BUN 17, Potassium 4.1, Sodium 141, GFR 78-90  Recommendations:She will take Furosemide as prescribed.   Follow-up plan: ICM clinic phone appointment on8/28/2020 (manual send) to recheck fluid levels.    Copy of ICM check sent to Dr.Klein and Dr Bettina Gavia.   3 month ICM trend: 05/08/2019    1 Year ICM trend:       [name], RN 05/08/2019 3:11 PM

## 2019-05-09 ENCOUNTER — Ambulatory Visit (INDEPENDENT_AMBULATORY_CARE_PROVIDER_SITE_OTHER): Payer: Medicare HMO | Admitting: *Deleted

## 2019-05-09 DIAGNOSIS — I429 Cardiomyopathy, unspecified: Secondary | ICD-10-CM

## 2019-05-10 LAB — CUP PACEART REMOTE DEVICE CHECK
Battery Remaining Longevity: 46 mo
Battery Remaining Percentage: 54 %
Battery Voltage: 2.95 V
Brady Statistic AP VP Percent: 8.4 %
Brady Statistic AP VS Percent: 1 %
Brady Statistic AS VP Percent: 91 %
Brady Statistic AS VS Percent: 1 %
Brady Statistic RA Percent Paced: 7.8 %
Brady Statistic RV Percent Paced: 99 %
Date Time Interrogation Session: 20200825075652
HighPow Impedance: 70 Ohm
HighPow Impedance: 70 Ohm
Implantable Lead Implant Date: 20140210
Implantable Lead Implant Date: 20170222
Implantable Lead Implant Date: 20170222
Implantable Lead Location: 753858
Implantable Lead Location: 753859
Implantable Lead Location: 753860
Implantable Lead Model: 4298
Implantable Lead Model: 5076
Implantable Lead Model: 7122
Implantable Pulse Generator Implant Date: 20170222
Lead Channel Impedance Value: 380 Ohm
Lead Channel Impedance Value: 400 Ohm
Lead Channel Impedance Value: 480 Ohm
Lead Channel Pacing Threshold Amplitude: 0.75 V
Lead Channel Pacing Threshold Amplitude: 0.875 V
Lead Channel Pacing Threshold Pulse Width: 0.5 ms
Lead Channel Pacing Threshold Pulse Width: 0.5 ms
Lead Channel Sensing Intrinsic Amplitude: 11.8 mV
Lead Channel Sensing Intrinsic Amplitude: 3.8 mV
Lead Channel Setting Pacing Amplitude: 2 V
Lead Channel Setting Pacing Amplitude: 2 V
Lead Channel Setting Pacing Amplitude: 2 V
Lead Channel Setting Pacing Pulse Width: 0.5 ms
Lead Channel Setting Pacing Pulse Width: 0.5 ms
Lead Channel Setting Sensing Sensitivity: 0.5 mV
Pulse Gen Serial Number: 7341405

## 2019-05-15 ENCOUNTER — Ambulatory Visit (INDEPENDENT_AMBULATORY_CARE_PROVIDER_SITE_OTHER): Payer: Medicare HMO

## 2019-05-15 DIAGNOSIS — Z95810 Presence of automatic (implantable) cardiac defibrillator: Secondary | ICD-10-CM

## 2019-05-15 DIAGNOSIS — I5022 Chronic systolic (congestive) heart failure: Secondary | ICD-10-CM

## 2019-05-18 ENCOUNTER — Encounter: Payer: Self-pay | Admitting: Cardiology

## 2019-05-18 NOTE — Progress Notes (Signed)
EPIC Encounter for ICM Monitoring  Patient Name: Laurie [name] is a 73 y.o. female Date: 05/18/2019 Primary Care Physican: Haywood Pao, MD Primary Cardiologist:Munley Electrophysiologist:Klein Bi-V Pacing:>99%on 8/25 report 8/25/2020Weight:196lbs   Transmission reviewed.  CorVueThoracic impedance returned to baseline normal.  Prescribed:Furosemide40 mg take 2 tablets (80 mg total) by mouth every morning and 1 tablet (40 mg total) every evening. Take extra tablet as needed for weight gain of 3 lbs or more.  Labs: 07/12/2018 Creatinine 0.77, BUN 17, Potassium 4.1, Sodium 141, GFR 78-90  Recommendations: None  Follow-up plan: ICM clinic phone appointment on 07/02/2019.    Copy of ICM check sent to Dr. [name].   Direct Trend updated through 05/11/2019      [name], RN 05/18/2019 1:51 PM

## 2019-05-18 NOTE — Progress Notes (Signed)
Remote ICD transmission.   

## 2019-06-22 ENCOUNTER — Ambulatory Visit: Payer: Medicare HMO | Admitting: Endocrinology

## 2019-06-22 DIAGNOSIS — Z0289 Encounter for other administrative examinations: Secondary | ICD-10-CM

## 2019-07-02 ENCOUNTER — Ambulatory Visit (INDEPENDENT_AMBULATORY_CARE_PROVIDER_SITE_OTHER): Payer: Medicare HMO

## 2019-07-02 DIAGNOSIS — I5022 Chronic systolic (congestive) heart failure: Secondary | ICD-10-CM

## 2019-07-02 DIAGNOSIS — Z95810 Presence of automatic (implantable) cardiac defibrillator: Secondary | ICD-10-CM

## 2019-07-04 ENCOUNTER — Telehealth: Payer: Self-pay

## 2019-07-04 NOTE — Progress Notes (Signed)
EPIC Encounter for ICM Monitoring  Patient Name: Laurie [name] is a 73 y.o. female Date: 07/04/2019 Primary Care Physican: Tisovec, Fransico Him, MD Primary Cardiologist:Munley Electrophysiologist:Klein Bi-V Pacing:>99% 8/25/2020Weight:196lbs   Attempted call to patient and unable to reach.   Transmission reviewed.   CorVueThoracic impedancenormal but was suggestive of possible fluid accumulation 10/3 - 10/12.  Prescribed:Furosemide40 mg take 2 tablets (80 mg total) by mouth every morning and 1 tablet (40 mg total) every evening. Take extra tablet as needed for weight gain of 3 lbs or more.  Labs: 07/12/2018 Creatinine 0.77, BUN 17, Potassium 4.1, Sodium 141, GFR 78-90  Recommendations: Unable to reach.    Follow-up plan: ICM clinic phone appointment on 08/14/2019.   91 day device clinic remote transmission 08/13/2019.     Copy of ICM check sent to Dr. [name].   3 month ICM trend: 07/02/2019    1 Year ICM trend:       [name], RN 07/04/2019 11:06 AM

## 2019-07-04 NOTE — Telephone Encounter (Signed)
Remote ICM transmission received.  Attempted call to patient regarding ICM remote transmission and no answer.  

## 2019-08-13 ENCOUNTER — Ambulatory Visit (INDEPENDENT_AMBULATORY_CARE_PROVIDER_SITE_OTHER): Payer: Medicare HMO | Admitting: *Deleted

## 2019-08-13 DIAGNOSIS — I5022 Chronic systolic (congestive) heart failure: Secondary | ICD-10-CM | POA: Diagnosis not present

## 2019-08-13 LAB — CUP PACEART REMOTE DEVICE CHECK
Battery Remaining Longevity: 43 mo
Battery Remaining Percentage: 51 %
Battery Voltage: 2.93 V
Brady Statistic AP VP Percent: 7.6 %
Brady Statistic AP VS Percent: 1 %
Brady Statistic AS VP Percent: 92 %
Brady Statistic AS VS Percent: 1 %
Brady Statistic RA Percent Paced: 7.1 %
Date Time Interrogation Session: 20201130020017
HighPow Impedance: 69 Ohm
HighPow Impedance: 69 Ohm
Implantable Lead Implant Date: 20140210
Implantable Lead Implant Date: 20170222
Implantable Lead Implant Date: 20170222
Implantable Lead Location: 753858
Implantable Lead Location: 753859
Implantable Lead Location: 753860
Implantable Lead Model: 4298
Implantable Lead Model: 5076
Implantable Lead Model: 7122
Implantable Pulse Generator Implant Date: 20170222
Lead Channel Impedance Value: 350 Ohm
Lead Channel Impedance Value: 400 Ohm
Lead Channel Impedance Value: 480 Ohm
Lead Channel Pacing Threshold Amplitude: 0.5 V
Lead Channel Pacing Threshold Amplitude: 0.625 V
Lead Channel Pacing Threshold Amplitude: 0.75 V
Lead Channel Pacing Threshold Pulse Width: 0.5 ms
Lead Channel Pacing Threshold Pulse Width: 0.5 ms
Lead Channel Pacing Threshold Pulse Width: 0.5 ms
Lead Channel Sensing Intrinsic Amplitude: 12 mV
Lead Channel Sensing Intrinsic Amplitude: 3.1 mV
Lead Channel Setting Pacing Amplitude: 2 V
Lead Channel Setting Pacing Amplitude: 2 V
Lead Channel Setting Pacing Amplitude: 2 V
Lead Channel Setting Pacing Pulse Width: 0.5 ms
Lead Channel Setting Pacing Pulse Width: 0.5 ms
Lead Channel Setting Sensing Sensitivity: 0.5 mV
Pulse Gen Serial Number: 7341405

## 2019-08-14 ENCOUNTER — Ambulatory Visit (INDEPENDENT_AMBULATORY_CARE_PROVIDER_SITE_OTHER): Payer: Medicare HMO

## 2019-08-14 DIAGNOSIS — Z95810 Presence of automatic (implantable) cardiac defibrillator: Secondary | ICD-10-CM | POA: Diagnosis not present

## 2019-08-14 DIAGNOSIS — I5022 Chronic systolic (congestive) heart failure: Secondary | ICD-10-CM | POA: Diagnosis not present

## 2019-08-17 NOTE — Progress Notes (Signed)
EPIC Encounter for ICM Monitoring  Patient Name: Laurie [name] is a 73 y.o. female Date: 08/17/2019 Primary Care Physican: Tisovec, Fransico Him, MD Primary Cardiologist:Munley Electrophysiologist:Klein Bi-V Pacing:>99% 8/25/2020Weight:196lbs   Transmission reviewed.   CorVueThoracic impedancenormal.  Prescribed:Furosemide40 mg take 2 tablets (80 mg total) by mouth every morning and 1 tablet (40 mg total) every evening. Take extra tablet as needed for weight gain of 3 lbs or more. Potassium 20 mEq take 1 tablet daily.  Labs: 07/12/2018 Creatinine 0.77, BUN 17, Potassium 4.1, Sodium 141, GFR 78-90  Recommendations:  None   Follow-up plan: ICM clinic phone appointment on 09/24/2019.   91 day device clinic remote transmission 11/12/2019.     Copy of ICM check sent to Dr. [name].   3 month ICM trend: 08/13/2019    1 Year ICM trend:       [name], RN 08/17/2019 8:36 AM

## 2019-09-05 NOTE — Progress Notes (Signed)
ICD remote 

## 2019-09-24 ENCOUNTER — Ambulatory Visit (INDEPENDENT_AMBULATORY_CARE_PROVIDER_SITE_OTHER): Payer: Medicare HMO

## 2019-09-24 DIAGNOSIS — I5022 Chronic systolic (congestive) heart failure: Secondary | ICD-10-CM | POA: Diagnosis not present

## 2019-09-24 DIAGNOSIS — Z95810 Presence of automatic (implantable) cardiac defibrillator: Secondary | ICD-10-CM

## 2019-09-28 ENCOUNTER — Telehealth: Payer: Self-pay

## 2019-09-28 NOTE — Progress Notes (Signed)
EPIC Encounter for ICM Monitoring  Patient Name: Laurie [name] is a 74 y.o. female Date: 09/28/2019 Primary Care Physican: Tisovec, Fransico Him, MD Primary Cardiologist:Munley Electrophysiologist:Klein Bi-V Pacing:>99% LastWeight:196lbs   Attempted call to patient and unable to reach.  Transmission reviewed.   CorVueThoracic impedancenormal.  Prescribed:Furosemide40 mg take 2 tablets (80 mg total) by mouth every morning and 1 tablet (40 mg total) every evening. Take extra tablet as needed for weight gain of 3 lbs or more. Potassium 20 mEq take 1 tablet daily.  Labs: 07/12/2018 Creatinine 0.77, BUN 17, Potassium 4.1, Sodium 141, GFR 78-90  Recommendations: Unable to reach.    Follow-up plan: ICM clinic phone appointment on2/15/2021. 91 day device clinic remote transmission 11/12/2019.   Copy of ICM check sent to [place].    3 month ICM trend: 09/24/2019    1 Year ICM trend:       [name], RN 09/28/2019 8:43 AM

## 2019-09-28 NOTE — Telephone Encounter (Signed)
Remote ICM transmission received.  Attempted call to patient regarding ICM remote transmission and no answer.  

## 2019-10-29 ENCOUNTER — Ambulatory Visit (INDEPENDENT_AMBULATORY_CARE_PROVIDER_SITE_OTHER): Payer: Medicare HMO

## 2019-10-29 DIAGNOSIS — Z95810 Presence of automatic (implantable) cardiac defibrillator: Secondary | ICD-10-CM | POA: Diagnosis not present

## 2019-10-29 DIAGNOSIS — I5022 Chronic systolic (congestive) heart failure: Secondary | ICD-10-CM | POA: Diagnosis not present

## 2019-11-02 NOTE — Progress Notes (Signed)
EPIC Encounter for ICM Monitoring  Patient Name: Laurie [name] is a 74 y.o. female Date: 11/02/2019 Primary Care Physican: Tisovec, Fransico Him, MD Primary Cardiologist:Munley Electrophysiologist:Klein Bi-V Pacing:>99% LastWeight:196lbs   Transmission reviewed.   CorVueThoracic impedancenormal.  Prescribed:Furosemide40 mg take 2 tablets (80 mg total) by mouth every morning and 1 tablet (40 mg total) every evening. Take extra tablet as needed for weight gain of 3 lbs or more. Potassium 20 mEq take 1 tablet daily.  Labs: 07/12/2018 Creatinine 0.77, BUN 17, Potassium 4.1, Sodium 141, GFR 78-90  Recommendations:None  Follow-up plan: ICM clinic phone appointment on3/22/2021. 91 day device clinic remote transmission3/09/2019.   Copy of ICM check sent to [place].   3 month ICM trend: 10/29/2019    1 Year ICM trend:       [name], RN 11/02/2019 4:37 PM

## 2019-11-12 ENCOUNTER — Ambulatory Visit (INDEPENDENT_AMBULATORY_CARE_PROVIDER_SITE_OTHER): Payer: Medicare HMO | Admitting: *Deleted

## 2019-11-12 DIAGNOSIS — I5022 Chronic systolic (congestive) heart failure: Secondary | ICD-10-CM

## 2019-11-12 LAB — CUP PACEART REMOTE DEVICE CHECK
Battery Remaining Longevity: 40 mo
Battery Remaining Percentage: 47 %
Battery Voltage: 2.93 V
Brady Statistic AP VP Percent: 7 %
Brady Statistic AP VS Percent: 1 %
Brady Statistic AS VP Percent: 92 %
Brady Statistic AS VS Percent: 1 %
Brady Statistic RA Percent Paced: 6.5 %
Date Time Interrogation Session: 20210301040909
HighPow Impedance: 63 Ohm
HighPow Impedance: 63 Ohm
Implantable Lead Implant Date: 20140210
Implantable Lead Implant Date: 20170222
Implantable Lead Implant Date: 20170222
Implantable Lead Location: 753858
Implantable Lead Location: 753859
Implantable Lead Location: 753860
Implantable Lead Model: 4298
Implantable Lead Model: 5076
Implantable Lead Model: 7122
Implantable Pulse Generator Implant Date: 20170222
Lead Channel Impedance Value: 350 Ohm
Lead Channel Impedance Value: 390 Ohm
Lead Channel Impedance Value: 460 Ohm
Lead Channel Pacing Threshold Amplitude: 0.5 V
Lead Channel Pacing Threshold Amplitude: 0.75 V
Lead Channel Pacing Threshold Amplitude: 0.875 V
Lead Channel Pacing Threshold Pulse Width: 0.5 ms
Lead Channel Pacing Threshold Pulse Width: 0.5 ms
Lead Channel Pacing Threshold Pulse Width: 0.5 ms
Lead Channel Sensing Intrinsic Amplitude: 12 mV
Lead Channel Sensing Intrinsic Amplitude: 2.9 mV
Lead Channel Setting Pacing Amplitude: 2 V
Lead Channel Setting Pacing Amplitude: 2 V
Lead Channel Setting Pacing Amplitude: 2 V
Lead Channel Setting Pacing Pulse Width: 0.5 ms
Lead Channel Setting Pacing Pulse Width: 0.5 ms
Lead Channel Setting Sensing Sensitivity: 0.5 mV
Pulse Gen Serial Number: 7341405

## 2019-11-13 NOTE — Progress Notes (Signed)
ICD Remote  

## 2019-12-03 ENCOUNTER — Ambulatory Visit (INDEPENDENT_AMBULATORY_CARE_PROVIDER_SITE_OTHER): Payer: Medicare HMO

## 2019-12-03 DIAGNOSIS — Z95810 Presence of automatic (implantable) cardiac defibrillator: Secondary | ICD-10-CM

## 2019-12-03 DIAGNOSIS — I5022 Chronic systolic (congestive) heart failure: Secondary | ICD-10-CM

## 2019-12-05 ENCOUNTER — Telehealth: Payer: Self-pay

## 2019-12-05 NOTE — Progress Notes (Signed)
EPIC Encounter for ICM Monitoring  Patient Name: Laurie [name] is a 74 y.o. female Date: 12/05/2019 Primary Care Physican: Tisovec, Fransico Him, MD Primary Cardiologist:Munley Electrophysiologist:Klein Bi-V Pacing:>99% LastWeight:196lbs   Attempted call to patient and unable to reach.  Transmission reviewed.   CorVueThoracic impedancenormal.  Prescribed:  Furosemide40 mg take 2 tablets (80 mg total) by mouth every morning and 1 tablet (40 mg total) every evening. Take extra tablet as needed for weight gain of 3 lbs or more.   Potassium 20 mEq take 1 tablet daily.  Labs: 07/12/2018 Creatinine 0.77, BUN 17, Potassium 4.1, Sodium 141, GFR 78-90  Recommendations:Unable to reach.    Follow-up plan: ICM clinic phone appointment on 01/07/2020. 91 day device clinic remote transmission6/10/2019.   Copy of ICM check sent to [place]  3 month ICM trend: 12/03/2019    1 Year ICM trend:       [name], RN 12/05/2019 3:28 PM

## 2019-12-05 NOTE — Telephone Encounter (Signed)
Remote ICM transmission received.  Attempted call to patient regarding ICM remote transmission and no answer.  

## 2020-01-07 ENCOUNTER — Ambulatory Visit (INDEPENDENT_AMBULATORY_CARE_PROVIDER_SITE_OTHER): Payer: Medicare HMO

## 2020-01-07 DIAGNOSIS — I5022 Chronic systolic (congestive) heart failure: Secondary | ICD-10-CM | POA: Diagnosis not present

## 2020-01-07 DIAGNOSIS — Z95810 Presence of automatic (implantable) cardiac defibrillator: Secondary | ICD-10-CM | POA: Diagnosis not present

## 2020-01-08 ENCOUNTER — Inpatient Hospital Stay (HOSPITAL_COMMUNITY): Payer: Medicare HMO

## 2020-01-08 ENCOUNTER — Emergency Department (HOSPITAL_COMMUNITY): Payer: Medicare HMO

## 2020-01-08 ENCOUNTER — Inpatient Hospital Stay (HOSPITAL_COMMUNITY)
Admission: EM | Admit: 2020-01-08 | Discharge: 2020-01-12 | DRG: 853 | Disposition: A | Discharge status: Home Health | Payer: Medicare HMO | Attending: Internal Medicine | Admitting: Internal Medicine

## 2020-01-08 DIAGNOSIS — I21A1 Myocardial infarction type 2: Secondary | ICD-10-CM | POA: Diagnosis present

## 2020-01-08 DIAGNOSIS — J189 Pneumonia, unspecified organism: Secondary | ICD-10-CM | POA: Diagnosis not present

## 2020-01-08 DIAGNOSIS — Z7982 Long term (current) use of aspirin: Secondary | ICD-10-CM

## 2020-01-08 DIAGNOSIS — Z8673 Personal history of transient ischemic attack (TIA), and cerebral infarction without residual deficits: Secondary | ICD-10-CM

## 2020-01-08 DIAGNOSIS — I5022 Chronic systolic (congestive) heart failure: Secondary | ICD-10-CM | POA: Diagnosis present

## 2020-01-08 DIAGNOSIS — R410 Disorientation, unspecified: Secondary | ICD-10-CM | POA: Diagnosis not present

## 2020-01-08 DIAGNOSIS — I480 Paroxysmal atrial fibrillation: Secondary | ICD-10-CM | POA: Diagnosis present

## 2020-01-08 DIAGNOSIS — Z20822 Contact with and (suspected) exposure to covid-19: Secondary | ICD-10-CM | POA: Diagnosis present

## 2020-01-08 DIAGNOSIS — A419 Sepsis, unspecified organism: Secondary | ICD-10-CM | POA: Diagnosis not present

## 2020-01-08 DIAGNOSIS — E1140 Type 2 diabetes mellitus with diabetic neuropathy, unspecified: Secondary | ICD-10-CM

## 2020-01-08 DIAGNOSIS — E669 Obesity, unspecified: Secondary | ICD-10-CM | POA: Diagnosis present

## 2020-01-08 DIAGNOSIS — E785 Hyperlipidemia, unspecified: Secondary | ICD-10-CM | POA: Diagnosis present

## 2020-01-08 DIAGNOSIS — G9341 Metabolic encephalopathy: Secondary | ICD-10-CM | POA: Diagnosis not present

## 2020-01-08 DIAGNOSIS — Z91040 Latex allergy status: Secondary | ICD-10-CM

## 2020-01-08 DIAGNOSIS — T82855A Stenosis of coronary artery stent, initial encounter: Secondary | ICD-10-CM | POA: Diagnosis present

## 2020-01-08 DIAGNOSIS — Z7902 Long term (current) use of antithrombotics/antiplatelets: Secondary | ICD-10-CM | POA: Diagnosis not present

## 2020-01-08 DIAGNOSIS — I25810 Atherosclerosis of coronary artery bypass graft(s) without angina pectoris: Secondary | ICD-10-CM | POA: Diagnosis not present

## 2020-01-08 DIAGNOSIS — I361 Nonrheumatic tricuspid (valve) insufficiency: Secondary | ICD-10-CM | POA: Diagnosis not present

## 2020-01-08 DIAGNOSIS — I214 Non-ST elevation (NSTEMI) myocardial infarction: Secondary | ICD-10-CM

## 2020-01-08 DIAGNOSIS — Z833 Family history of diabetes mellitus: Secondary | ICD-10-CM

## 2020-01-08 DIAGNOSIS — Z95810 Presence of automatic (implantable) cardiac defibrillator: Secondary | ICD-10-CM | POA: Diagnosis present

## 2020-01-08 DIAGNOSIS — I119 Hypertensive heart disease without heart failure: Secondary | ICD-10-CM | POA: Diagnosis present

## 2020-01-08 DIAGNOSIS — I2510 Atherosclerotic heart disease of native coronary artery without angina pectoris: Secondary | ICD-10-CM | POA: Diagnosis not present

## 2020-01-08 DIAGNOSIS — I255 Ischemic cardiomyopathy: Secondary | ICD-10-CM | POA: Diagnosis present

## 2020-01-08 DIAGNOSIS — E875 Hyperkalemia: Secondary | ICD-10-CM | POA: Diagnosis present

## 2020-01-08 DIAGNOSIS — R4182 Altered mental status, unspecified: Secondary | ICD-10-CM

## 2020-01-08 DIAGNOSIS — E042 Nontoxic multinodular goiter: Secondary | ICD-10-CM | POA: Diagnosis present

## 2020-01-08 DIAGNOSIS — I5023 Acute on chronic systolic (congestive) heart failure: Secondary | ICD-10-CM | POA: Diagnosis not present

## 2020-01-08 DIAGNOSIS — I110 Hypertensive heart disease with heart failure: Secondary | ICD-10-CM | POA: Diagnosis present

## 2020-01-08 DIAGNOSIS — R000 Tachycardia, unspecified: Secondary | ICD-10-CM | POA: Diagnosis not present

## 2020-01-08 DIAGNOSIS — R778 Other specified abnormalities of plasma proteins: Secondary | ICD-10-CM | POA: Diagnosis not present

## 2020-01-08 DIAGNOSIS — J9601 Acute respiratory failure with hypoxia: Secondary | ICD-10-CM | POA: Diagnosis not present

## 2020-01-08 DIAGNOSIS — G4733 Obstructive sleep apnea (adult) (pediatric): Secondary | ICD-10-CM | POA: Diagnosis present

## 2020-01-08 DIAGNOSIS — Z955 Presence of coronary angioplasty implant and graft: Secondary | ICD-10-CM

## 2020-01-08 DIAGNOSIS — J690 Pneumonitis due to inhalation of food and vomit: Secondary | ICD-10-CM | POA: Diagnosis present

## 2020-01-08 DIAGNOSIS — G4730 Sleep apnea, unspecified: Secondary | ICD-10-CM | POA: Diagnosis not present

## 2020-01-08 DIAGNOSIS — Z79899 Other long term (current) drug therapy: Secondary | ICD-10-CM | POA: Diagnosis not present

## 2020-01-08 DIAGNOSIS — Z951 Presence of aortocoronary bypass graft: Secondary | ICD-10-CM

## 2020-01-08 DIAGNOSIS — Z888 Allergy status to other drugs, medicaments and biological substances status: Secondary | ICD-10-CM

## 2020-01-08 DIAGNOSIS — Z794 Long term (current) use of insulin: Secondary | ICD-10-CM

## 2020-01-08 DIAGNOSIS — R0902 Hypoxemia: Secondary | ICD-10-CM | POA: Diagnosis not present

## 2020-01-08 DIAGNOSIS — E1165 Type 2 diabetes mellitus with hyperglycemia: Secondary | ICD-10-CM | POA: Diagnosis not present

## 2020-01-08 DIAGNOSIS — E1142 Type 2 diabetes mellitus with diabetic polyneuropathy: Secondary | ICD-10-CM | POA: Diagnosis present

## 2020-01-08 DIAGNOSIS — R7989 Other specified abnormal findings of blood chemistry: Secondary | ICD-10-CM

## 2020-01-08 DIAGNOSIS — R918 Other nonspecific abnormal finding of lung field: Secondary | ICD-10-CM | POA: Diagnosis not present

## 2020-01-08 DIAGNOSIS — I340 Nonrheumatic mitral (valve) insufficiency: Secondary | ICD-10-CM | POA: Diagnosis not present

## 2020-01-08 DIAGNOSIS — Z8701 Personal history of pneumonia (recurrent): Secondary | ICD-10-CM

## 2020-01-08 DIAGNOSIS — Z91048 Other nonmedicinal substance allergy status: Secondary | ICD-10-CM

## 2020-01-08 DIAGNOSIS — F05 Delirium due to known physiological condition: Secondary | ICD-10-CM | POA: Diagnosis not present

## 2020-01-08 DIAGNOSIS — Z8249 Family history of ischemic heart disease and other diseases of the circulatory system: Secondary | ICD-10-CM

## 2020-01-08 LAB — CBC WITH DIFFERENTIAL/PLATELET
Abs Immature Granulocytes: 0.05 10*3/uL (ref 0.00–0.07)
Basophils Absolute: 0.1 10*3/uL (ref 0.0–0.1)
Basophils Relative: 0 %
Eosinophils Absolute: 0 10*3/uL (ref 0.0–0.5)
Eosinophils Relative: 0 %
HCT: 48.6 % — ABNORMAL HIGH (ref 36.0–46.0)
Hemoglobin: 16.5 g/dL — ABNORMAL HIGH (ref 12.0–15.0)
Immature Granulocytes: 0 %
Lymphocytes Relative: 8 %
Lymphs Abs: 0.9 10*3/uL (ref 0.7–4.0)
MCH: 30.8 pg (ref 26.0–34.0)
MCHC: 34 g/dL (ref 30.0–36.0)
MCV: 90.7 fL (ref 80.0–100.0)
Monocytes Absolute: 0.3 10*3/uL (ref 0.1–1.0)
Monocytes Relative: 3 %
Neutro Abs: 11.1 10*3/uL — ABNORMAL HIGH (ref 1.7–7.7)
Neutrophils Relative %: 89 %
Platelets: 237 10*3/uL (ref 150–400)
RBC: 5.36 MIL/uL — ABNORMAL HIGH (ref 3.87–5.11)
RDW: 11.9 % (ref 11.5–15.5)
WBC: 12.5 10*3/uL — ABNORMAL HIGH (ref 4.0–10.5)
nRBC: 0 % (ref 0.0–0.2)

## 2020-01-08 LAB — TROPONIN I (HIGH SENSITIVITY)
Troponin I (High Sensitivity): 4694 ng/L (ref <18)
Troponin I (High Sensitivity): 13568 ng/L (ref <18)
Troponin I (High Sensitivity): >27000 ng/L (ref <18)

## 2020-01-08 LAB — COMPREHENSIVE METABOLIC PANEL
ALT: 28 U/L (ref 0–44)
AST: 66 U/L — ABNORMAL HIGH (ref 15–41)
Albumin: 3.6 g/dL (ref 3.5–5.0)
Alkaline Phosphatase: 69 U/L (ref 38–126)
Anion gap: 17 — ABNORMAL HIGH (ref 5–15)
BUN: 17 mg/dL (ref 8–23)
CO2: 18 mmol/L — ABNORMAL LOW (ref 22–32)
Calcium: 9.6 mg/dL (ref 8.9–10.3)
Chloride: 103 mmol/L (ref 98–111)
Creatinine, Ser: 1 mg/dL (ref 0.44–1.00)
GFR calc Af Amer: >60 mL/min (ref >60)
GFR calc non Af Amer: 56 mL/min — ABNORMAL LOW (ref >60)
Glucose, Bld: 467 mg/dL — ABNORMAL HIGH (ref 70–99)
Potassium: 5.5 mmol/L — ABNORMAL HIGH (ref 3.5–5.1)
Sodium: 138 mmol/L (ref 135–145)
Total Bilirubin: 1.5 mg/dL — ABNORMAL HIGH (ref 0.3–1.2)
Total Protein: 7.3 g/dL (ref 6.5–8.1)

## 2020-01-08 LAB — GLUCOSE, CAPILLARY: Glucose-Capillary: 433 mg/dL — ABNORMAL HIGH (ref 70–99)

## 2020-01-08 LAB — CK: Total CK: 421 U/L — ABNORMAL HIGH (ref 38–234)

## 2020-01-08 LAB — RESPIRATORY PANEL BY RT PCR (FLU A&B, COVID)
Influenza A by PCR: NEGATIVE
Influenza B by PCR: NEGATIVE
SARS Coronavirus 2 by RT PCR: NEGATIVE

## 2020-01-08 LAB — BRAIN NATRIURETIC PEPTIDE: B Natriuretic Peptide: 655.6 pg/mL — ABNORMAL HIGH (ref 0.0–100.0)

## 2020-01-08 MED ORDER — INSULIN PEN NEEDLE 32G X 4 MM MISC: 1.0000 | Freq: Three times a day (TID) | Status: DC

## 2020-01-08 MED ORDER — ASPIRIN 300 MG RE SUPP: 300.0000 mg | Freq: Once | RECTAL | Status: DC

## 2020-01-08 MED ORDER — SODIUM CHLORIDE 0.9 % IV SOLN
500.0000 mg | Freq: Once | INTRAVENOUS | Status: AC
  Administered 2020-01-08: 500 mg via INTRAVENOUS
  Filled 2020-01-08: qty 500

## 2020-01-08 MED ORDER — INSULIN ASPART 100 UNIT/ML ~~LOC~~ SOLN
0.0000 [IU] | Freq: Three times a day (TID) | SUBCUTANEOUS | Status: DC
  Administered 2020-01-09 (×2): 9 [IU] via SUBCUTANEOUS
  Administered 2020-01-09 – 2020-01-10 (×2): 5 [IU] via SUBCUTANEOUS
  Administered 2020-01-10: 7 [IU] via SUBCUTANEOUS
  Administered 2020-01-10 – 2020-01-11 (×3): 5 [IU] via SUBCUTANEOUS
  Administered 2020-01-11 – 2020-01-12 (×2): 3 [IU] via SUBCUTANEOUS

## 2020-01-08 MED ORDER — HEPARIN (PORCINE) 25000 UT/250ML-% IV SOLN
1100.0000 [IU]/h | INTRAVENOUS | Status: DC
  Administered 2020-01-08: 850 [IU]/h via INTRAVENOUS
  Filled 2020-01-08 (×2): qty 250

## 2020-01-08 MED ORDER — LISINOPRIL 20 MG PO TABS
20.0000 mg | ORAL_TABLET | Freq: Every day | ORAL | Status: DC
  Administered 2020-01-09 – 2020-01-10 (×2): 20 mg via ORAL
  Filled 2020-01-08 (×2): qty 1

## 2020-01-08 MED ORDER — NITROGLYCERIN 2 % TD OINT
0.5000 [in_us] | TOPICAL_OINTMENT | Freq: Four times a day (QID) | TRANSDERMAL | Status: DC
  Administered 2020-01-08 – 2020-01-09 (×3): 0.5 [in_us] via TOPICAL
  Filled 2020-01-08: qty 1
  Filled 2020-01-08: qty 30

## 2020-01-08 MED ORDER — HEPARIN BOLUS VIA INFUSION
3500.0000 [IU] | Freq: Once | INTRAVENOUS | Status: AC
  Administered 2020-01-08: 3500 [IU] via INTRAVENOUS
  Filled 2020-01-08: qty 3500

## 2020-01-08 MED ORDER — IPRATROPIUM-ALBUTEROL 0.5-2.5 (3) MG/3ML IN SOLN: 3.0000 mL | RESPIRATORY_TRACT | Status: DC | PRN

## 2020-01-08 MED ORDER — METOPROLOL SUCCINATE ER 50 MG PO TB24
50.0000 mg | ORAL_TABLET | Freq: Every day | ORAL | Status: DC
  Administered 2020-01-08 – 2020-01-10 (×3): 50 mg via ORAL
  Filled 2020-01-08: qty 1
  Filled 2020-01-08: qty 2
  Filled 2020-01-08: qty 1
  Filled 2020-01-08: qty 2

## 2020-01-08 MED ORDER — DEXTROSE 5 % IV SOLN
250.0000 mg | INTRAVENOUS | Status: DC
  Administered 2020-01-10 – 2020-01-12 (×3): 250 mg via INTRAVENOUS
  Filled 2020-01-08 (×6): qty 250

## 2020-01-08 MED ORDER — PROSIGHT PO TABS
1.0000 | ORAL_TABLET | Freq: Every day | ORAL | Status: DC
  Administered 2020-01-09 – 2020-01-12 (×4): 1 via ORAL
  Filled 2020-01-08 (×5): qty 1

## 2020-01-08 MED ORDER — INSULIN ASPART 100 UNIT/ML ~~LOC~~ SOLN
0.0000 [IU] | Freq: Every day | SUBCUTANEOUS | Status: DC
  Administered 2020-01-08: 5 [IU] via SUBCUTANEOUS
  Administered 2020-01-09 – 2020-01-11 (×3): 3 [IU] via SUBCUTANEOUS

## 2020-01-08 MED ORDER — SODIUM CHLORIDE 0.9 % IV SOLN
1.0000 g | INTRAVENOUS | Status: DC
  Administered 2020-01-09 – 2020-01-11 (×3): 1 g via INTRAVENOUS
  Filled 2020-01-08 (×4): qty 10

## 2020-01-08 MED ORDER — AZITHROMYCIN 250 MG PO TABS
500.0000 mg | ORAL_TABLET | Freq: Once | ORAL | Status: DC
  Filled 2020-01-08: qty 2

## 2020-01-08 MED ORDER — FUROSEMIDE 10 MG/ML IJ SOLN
40.0000 mg | Freq: Once | INTRAMUSCULAR | Status: AC
  Administered 2020-01-08: 40 mg via INTRAVENOUS
  Filled 2020-01-08: qty 4

## 2020-01-08 MED ORDER — SODIUM CHLORIDE 0.9 % IV SOLN
1.0000 g | Freq: Once | INTRAVENOUS | Status: AC
  Administered 2020-01-08: 1 g via INTRAVENOUS
  Filled 2020-01-08: qty 10

## 2020-01-08 NOTE — Consult Note (Addendum)
Cardiology Consultation:   Patient ID: Laurie [name] MRN: [id number]; DOB: March 19, 1946  Admit date: 01/08/2020 Date of Consult: 01/08/2020  Primary Care Provider: Haywood Pao, MD Primary Cardiologist: Dr. Bettina Gavia Primary Electrophysiologist:  Dr. [name]   Patient Profile:   Laurie [name] is a 74 y.o. female with a hx of chronic systolic CHF s/p BiV ICD, HTN, HLD, DM II, atrial fibrillation, CAD s/p CABG 2000 (PCI in 2019), h/o CVA and OSA who is being seen today for the evaluation of AMS, hypoxic respiratory failure and elevated troponin at the request of Dr. Avon Gully.  History of Present Illness:   Laurie [name] is a 74 year old female with past medical history of chronic systolic CHF s/p BiV ICD, HTN, HLD, DM II, atrial fibrillation, CAD s/p CABG 2000 (PCI in 2019), h/o CVA and OSA.  Patient initially had a [employer] pacemaker implant in 2014.  Echocardiogram in July 2015 showed EF 65%.  Ejection fraction dropped by February 20 17 to 35%.  Left heart cath obtained at the same time showed total occlusion of SVG to distal RCA, patent SVG to diagonal, patent SVG to obtuse marginal, patent LIMA to LAD, EF 35%.  Medical therapy was recommended.  Due to LV dysfunction, patient had device upgraded to biventricular ICD by Dr. [name] on 11/05/2015.  Since resynchronization therapy, her EF has gradually improved.  Echocardiogram obtained on 03/18/2017 showed EF 55 to 60%, mild ventricular dyssynergy, moderate LAE.  Last PCI was performed in February 2019 at which time she underwent PCI of SVG to diagonal and SVG to OM with separate drug-eluting stents.  Repeat echocardiogram obtained by Dr. Wynonia Lawman in March 2019 showed EF 45%, mild LVH, grade 1 DD, moderate LAE, mild to moderate MR.  She was last seen by Dr. Bettina Gavia in November 2019 at which time she was doing well.    Patient presented to [facility] on 01/08/2020 after being found down on the floor around 3 AM this morning by family member.   Patient says that she has been short of breath recently however denies any fever, chill, chest pain, lower extremity edema, orthopnea or PND.  She was confused on arrival and had severe hypoxemia with low O2 saturation.  She was started on nonrebreather therapy.  Initial lab work showed potassium of 5.5, repeat lab work showed potassium went up to 7.4.  Initial high-sensitivity troponin was 4694, total CK was mildly elevated at 421.  Subsequent troponin went up to 13,568.  BNP was 655.  White blood cell count was mildly elevated at 12.5.  Covid test was negative.  Glucose level 467.  EKG showed paced rhythm.  Chest x-ray showed right lobe opacity concerning for pneumonia.  Cardiology has been consulted for elevated troponin.   Past Medical History:  Diagnosis Date  . AICD (automatic cardioverter/defibrillator) present   . Bell's palsy 01/05/2010  . CAD (coronary artery disease)    Cath  07/21/1999  mild ostial, 80% stenosis proximal LAD, 90% stenosis proximal Diag 1, 95% stenosis proximal OM 1, 80% stenosis proximal RCA, 95% stenosis mid RCA    CABG 07/22/99  LIMA to LAD, SVG to dx, SVG to OM, SVG to PDA  Dr. [name]   Cardiolite 2012 no ischemia EF 67%  Cath 10/23/12 Severe native three-vessel coronary artery disease with occlusion of all 3 native coronary arteries, Patent sap  . Chronic lower back pain   . Chronic systolic CHF (congestive heart failure) ([place]) 10/22/2015  . Cystitis   . Hepatitis  1970s   "don't know which" (03/17/2017)  . HIstory of Bell's palsy    October 2012   . Hyperlipidemia    Intolerance to several statins   . Hypertensive heart disease without CHF   . Multinodular goiter   . Obesity (BMI 30-39.9)   . OSA on CPAP    "suppose to wear a mask" (03/17/2017)  . Peripheral neuropathy   . Pneumonia 1990s X 1   "think i had walking pneumonia"  . Seasonal allergies   . Stroke ([place]) 03/2017   "light one"; denies residual on 03/17/2017)  . Type II diabetes mellitus ([place])     Past  Surgical History:  Procedure Laterality Date  . BI-VENTRICULAR PACEMAKER UPGRADE  11/05/2015   "upgraded my pacemaker"  . BIOPSY THYROID  05/2010   percutaneous  . CARDIAC CATHETERIZATION N/A 10/21/2015   Procedure: Left Heart Cath and Cors/Grafts Angiography;  Surgeon: Belva Crome, MD;  Location: [place] [facility];  Service: Cardiovascular;  Laterality: N/A;  . CARPAL TUNNEL RELEASE Bilateral   . CATARACT EXTRACTION Right 01/06/2015  . CATARACT EXTRACTION W/ INTRAOCULAR LENS IMPLANT Left 12/16/2014  . CORONARY ARTERY BYPASS GRAFT  2000  . CORONARY STENT INTERVENTION N/A 10/28/2017   Procedure: CORONARY STENT INTERVENTION;  Surgeon: Jettie Booze, MD;  Location: [place] [facility];  Service: Cardiovascular;  Laterality: N/A;  . CYSTECTOMY Right    "middle finger"  . DENTAL IMPLANTS    . EP IMPLANTABLE DEVICE N/A 11/05/2015   Procedure: BiV Upgrade;  Surgeon: Deboraha Sprang, MD;  Location: [place] [facility];  Service: Cardiovascular;  Laterality: N/A;  . INSERT / REPLACE / REMOVE PACEMAKER    . LEAD REVISION N/A 10/25/2012   Procedure: LEAD REVISION;  Surgeon: Evans Lance, MD;  Location: [facility] CATH LAB;  Service: Cardiovascular;  Laterality: N/A;  . LEFT HEART CATH AND CORS/GRAFTS ANGIOGRAPHY N/A 10/28/2017   Procedure: LEFT HEART CATH AND CORS/GRAFTS ANGIOGRAPHY;  Surgeon: Jettie Booze, MD;  Location: [place] [facility];  Service: Cardiovascular;  Laterality: N/A;  . LEFT HEART CATHETERIZATION WITH CORONARY ANGIOGRAM N/A 10/23/2012   Procedure: LEFT HEART CATHETERIZATION WITH CORONARY ANGIOGRAM;  Surgeon: Jacolyn Reedy, MD;  Location: [facility] CATH LAB;  Service: Cardiovascular;  Laterality: N/A;  . PERMANENT PACEMAKER INSERTION N/A 10/23/2012   Procedure: PERMANENT PACEMAKER INSERTION;  Surgeon: Deboraha Sprang, MD;  Location: [facility] CATH LAB;  Service: Cardiovascular;  Laterality: N/A;  . PLANTAR FASCIA RELEASE Left    "& clipped a tendon that went thru bottom of my foot"  . TEMPORARY  PACEMAKER INSERTION N/A 10/22/2012   Procedure: TEMPORARY PACEMAKER INSERTION;  Surgeon: Troy Sine, MD;  Location: [facility] CATH LAB;  Service: Cardiovascular;  Laterality: N/A;  . TUBAL LIGATION       Home Medications:  Prior to Admission medications   Medication Sig Start Date End Date Taking? Authorizing Provider  aspirin 81 MG tablet Take 1 tablet (81 mg total) by mouth daily. 10/29/17  Yes Bhagat, Bhavinkumar, PA  beta carotene w/minerals (OCUVITE) tablet Take 1 tablet by mouth daily.   Yes [provider]  cetirizine (ZYRTEC) 10 MG tablet Take 1 tablet (10 mg total) by mouth daily. 03/20/17  Yes Velvet Bathe, MD  cholecalciferol (VITAMIN D) 1000 UNITS tablet Take 1,000 Units by mouth daily.    Yes [provider]  clopidogrel (PLAVIX) 75 MG tablet Take 1 tablet (75 mg total) by mouth daily. 12/06/18  Yes Richardo Priest, MD  furosemide (LASIX) 40 MG tablet TAKE 2 TABLETS BY MOUTH EVERY MORNING AND 1 TABLET EVERY EVENING. TAKE 1 ADDITIONAL TABLET FOR WEIGHT GAIN OF 3LBS OR MORE Patient taking differently: Take 40-80 mg by mouth See admin instructions. TAKE 2 TABLETS BY MOUTH EVERY MORNING AND 1 TABLET EVERY EVENING. TAKE 1 ADDITIONAL TABLET FOR WEIGHT GAIN OF 3LBS OR MORE 01/30/19  Yes Billie Ruddy, MD  ketoconazole (NIZORAL) 2 % cream Apply 1 application topically daily as needed for irritation. TO AFFECTED AREAS OF FACE    Yes [provider]  lisinopril (PRINIVIL,ZESTRIL) 20 MG tablet Take 1 tablet (20 mg total) by mouth daily. 12/08/18  Yes Richardo Priest, MD  metoprolol succinate (TOPROL-XL) 50 MG 24 hr tablet Take 1 tablet (50 mg total) by mouth daily. Take with or immediately following a meal. 12/04/18  Yes Munley, Hilton Cork, MD  nitroGLYCERIN (NITROSTAT) 0.4 MG SL tablet Place 0.4 mg under the tongue every 5 (five) minutes as needed for chest pain.  06/22/16  Yes [provider]  potassium chloride SA (KLOR-CON M20) 20 MEQ tablet Take 1 tablet (20 mEq  total) by mouth daily. 11/27/18  Yes Deboraha Sprang, MD  pravastatin (PRAVACHOL) 40 MG tablet TAKE 1 TABLET BY MOUTH ONCE DAILY 6 IN THE EVENING Patient taking differently: Take 40 mg by mouth daily. TAKE 1 TABLET BY MOUTH ONCE DAILY 6 IN THE EVENING 01/30/19  Yes Billie Ruddy, MD  Probiotic Product (PROBIOTIC DAILY) CAPS Take 1 capsule by mouth daily.   Yes [provider]  psyllium (METAMUCIL) 58.6 % powder Take 1 packet by mouth daily as needed (fiber).    Yes [provider]  vitamin C (ASCORBIC ACID) 500 MG tablet Take 500 mg by mouth every other day.   Yes [provider]  ACCU-CHEK FASTCLIX LANCETS MISC USE  TO CHECK BLOOD SUGAR FOUR TIMES DAILY Patient taking differently: 1 each by Other route 3 (three) times daily.  07/20/16   Renato Shin, MD  ACCU-CHEK SMARTVIEW test strip USE  TO CHECK BLOOD SUGAR FOUR TIMES DAILY Patient taking differently: 1 each by Other route 3 (three) times daily. USE  TO CHECK BLOOD SUGAR THREE TIMES DAILY 08/29/17   Renato Shin, MD  Alcohol Swabs (B-D SINGLE USE SWABS REGULAR) PADS Use 8 daily for testing blood sugar and insulin injections. 09/12/14   Renato Shin, MD  BD VEO INSULIN SYRINGE U/F 31G X 15/64" 1 ML MISC USE 10 TIMES A DAY WITH INSULIN AS DIRECTED 10/08/18   Renato Shin, MD  Blood Glucose Calibration (ACCU-CHEK SMARTVIEW CONTROL) LIQD Use to calibrate blood glucose meter. 09/12/14   Renato Shin, MD  Continuous Blood Gluc Sensor (FREESTYLE LIBRE 14 DAY SENSOR) MISC APPLY EVERY 14 DAYS 10/27/18   Renato Shin, MD  Insulin Glargine, 2 Unit Dial, (TOUJEO MAX SOLOSTAR) 300 UNIT/ML SOPN Inject 600 Units into the skin every morning. Patient not taking: Reported on 01/08/2020 04/13/19   Renato Shin, MD  Insulin Pen Needle (BD PEN NEEDLE NANO U/F) 32G X 4 MM MISC 1 each by Does not apply route 3 (three) times daily. Use to inject insulin TID; E11.40 01/17/19   Renato Shin, MD    Inpatient Medications: Scheduled Meds: .  aspirin  300 mg Rectal Once  . heparin  3,500 Units Intravenous Once  . nitroGLYCERIN  0.5 inch Topical Q6H   Continuous Infusions: . azithromycin    . cefTRIAXone (ROCEPHIN)  IV 1 g (01/08/20 1611)  . heparin  PRN Meds:   Allergies:    Allergies  Allergen Reactions  . Brilinta [Ticagrelor] Shortness Of Breath  . Statins Other (See Comments)    Statins make the legs ache  . Tricor [Fenofibrate] Other (See Comments)    Leg pain  . Latex Itching and Other (See Comments)    Affected areas turn red, also-  . Tape Rash    Heart leads break out the skin    Social History:   Social History   Socioeconomic History  . Marital status: Married    Spouse name: Not on file  . Number of children: 2  . Years of education: Not on file  . Highest education level: Not on file  Occupational History    Employer: RETIRED  Tobacco Use  . Smoking status: Never Smoker  . Smokeless tobacco: Never Used  Substance and Sexual Activity  . Alcohol use: No    Alcohol/week: 0.0 standard drinks  . Drug use: No  . Sexual activity: Never  Other Topics Concern  . Not on file  Social History Narrative   Does not work outside the home   Social Determinants of Health   Financial Resource Strain:   . Difficulty of Paying Living Expenses:   Food Insecurity:   . Worried About Charity fundraiser in the Last Year:   . Arboriculturist in the Last Year:   Transportation Needs:   . Film/video editor (Medical):   [name] Lack of Transportation (Non-Medical):   Physical Activity:   . Days of Exercise per Week:   . Minutes of Exercise per Session:   Stress:   . Feeling of Stress :   Social Connections:   . Frequency of Communication with Friends and Family:   . Frequency of Social Gatherings with Friends and Family:   . Attends Religious Services:   . Active Member of Clubs or Organizations:   . Attends Archivist Meetings:   [name] Marital Status:   Intimate Partner Violence:   . Fear  of Current or Ex-Partner:   . Emotionally Abused:   [name] Physically Abused:   . Sexually Abused:     Family History:    Family History  Problem Relation Age of Onset  . Diabetes Father   . Heart disease Father   . Diabetes Mother   . Diabetes Other        Siblings and 2 children  . Heart disease Other        CAD  . Colon cancer Neg Hx   . Colon polyps Neg Hx      ROS:  Please see the history of present illness.   All other ROS reviewed and negative.     Physical Exam/Data:   Vitals:   01/08/20 1345 01/08/20 1400 01/08/20 1446 01/08/20 1515  BP:  (!) 134/102  125/85  Pulse:  (!) 119  (!) 120  Resp:  20  (!) 26  Temp:      TempSrc:      SpO2: 93% 95%  96%  Weight:   81.6 kg   Height:   5\' 1"  (1.549 m)    No intake or output data in the 24 hours ending 01/08/20 1634 Last 3 Weights 01/08/2020 04/13/2019 01/02/2019  Weight (lbs) 180 lb 196 lb 12.8 oz 200 lb 6.4 oz  Weight (kg) 81.647 kg 89.268 kg 90.901 kg     Body mass index is 34.01 kg/m.  General:  Mildly confused, able to  follow command and answer most of the questions HEENT: normal Lymph: no adenopathy Neck: no JVD Endocrine:  No thryomegaly Vascular: No carotid bruits; FA pulses 2+ bilaterally without bruits  Cardiac:  Tachycardic. S1, S2; RRR; no murmur  Lungs:  R lung crackles Abd: soft, nontender, no hepatomegaly  Ext: no edema Musculoskeletal:  No deformities, BUE and BLE strength normal and equal Skin: warm and dry  Neuro:  CNs 2-12 intact, no focal abnormalities noted Psych:  Normal affect   EKG:  The EKG was personally reviewed and demonstrates:  Paced rhythm Telemetry:  Telemetry was personally reviewed and demonstrates:  Sinus tachycardia  Relevant CV Studies:  Cath 10/28/2017  Severe three vessel CAD.  Prox LAD to Mid LAD lesion is 100% stenosed. LIMA to LAD is patent.  1st Diag-1 lesion is 100% stenosed. SVG to diagonal is patent with 80% stenosis.  A drug-eluting stent was successfully  placed using a STENT RESOLUTE ONYX 2.5X12 in the SVG to diagonal.  Post intervention, there is a 0% residual stenosis.  Dist LAD lesion is 75% stenosed past LIMA insertion.  2nd Mrg lesion is 100% stenosed.  Ost 1st Mrg to 1st Mrg lesion is 100% stenosed. SVG to OM is occluded.  Post intervention, there is a 0% residual stenosis.  A drug-eluting stent was successfully placed using a STENT SYNERGY DES 3X20. This appears to be a fresh occlusion and was successfully treated.  Mid RCA to Dist RCA lesion is 100% stenosed. SVG to PDA is occluded,  LIMA and is normal in caliber.  Ost Cx to Dist Cx lesion is 70% stenosed.  The left ventricular ejection fraction is 45-50% by visual estimate.  There is mild left ventricular systolic dysfunction.  LV end diastolic pressure is moderately elevated.  There is no aortic valve stenosis.   Successful two vessel PCI to the SVG to diagonal and SVG to OM, with DES.  Continue dual antiplatelet therapy for at least one year and possibly clopidogrel indefinitely going forward.  Finish current bag of angiomax.    Laboratory Data:  High Sensitivity Troponin:   Recent Labs  Lab 01/08/20 1241 01/08/20 1500  TROPONINIHS 4,694* 13,568*     Chemistry Recent Labs  Lab 01/08/20 1241 01/08/20 1253  NA 138 133*  K 5.5* 7.4*  CL 103  --   CO2 18*  --   GLUCOSE 467*  --   BUN 17  --   CREATININE 1.00  --   CALCIUM 9.6  --   GFRNONAA 56*  --   GFRAA >60  --   ANIONGAP 17*  --     Recent Labs  Lab 01/08/20 1241  PROT 7.3  ALBUMIN 3.6  AST 66*  ALT 28  ALKPHOS 69  BILITOT 1.5*   Hematology Recent Labs  Lab 01/08/20 1241 01/08/20 1253  WBC 12.5*  --   RBC 5.36*  --   HGB 16.5* 17.0*  HCT 48.6* 50.0*  MCV 90.7  --   MCH 30.8  --   MCHC 34.0  --   RDW 11.9  --   PLT 237  --    BNP Recent Labs  Lab 01/08/20 1500  BNP 655.6*    DDimer No results for input(s): DDIMER in the last 168 hours.   Radiology/Studies:  DG Chest  Port 1 View  Result Date: 01/08/2020 CLINICAL DATA:  Dyspnea. EXAM: PORTABLE CHEST 1 VIEW COMPARISON:  October 29, 2017. FINDINGS: Stable cardiomediastinal silhouette. Status post coronary bypass graft. Left-sided pacemaker  is unchanged in position. No pneumothorax or pleural effusion is noted. Left lung is clear. New large diffuse right lung opacity is noted most consistent with pneumonia. Bony thorax is unremarkable. IMPRESSION: New large diffuse right lung opacity is noted most consistent with pneumonia. Electronically Signed   By: Marijo Conception M.D.   On: 01/08/2020 13:55       TIMI Risk Score for Unstable Angina or Non-ST Elevation MI:   The patient's TIMI risk score is  , which indicates a  % risk of all cause mortality, new or recurrent myocardial infarction or need for urgent revascularization in the next 14 days.   Assessment and Plan:   1. Altered mental status  -In the setting of acute respiratory failure with hypoxemia.  - given fall this morning, pending CT of head, if negative for bleeding, may start on IV heparin afterward  2. Acute respiratory failure: Currently on nonrebreather.  The chest x-ray showed possible right-sided pneumonia, however heart failure cannot completely be ruled out either.  Discussed with Dr. Claiborne Billings who recommended a trial of single dose of IV Lasix 40 mg to check her urine output.  BNP elevated at 655.6.  Will defer to primary team to consider antibiotic.  Will obtain echocardiogram to check ejection fraction.  3. NSTEMI: See #2, obtain echocardiogram.  Serial troponin overnight.  Once her tachycardia improved and her breathing improved, will consider ischemic work-up.  Likely will require cardiac catheterization for more definitive evaluation given how high her troponin was  -High-sensitivity troponin 4694--> 13568  -Continue home aspirin and Plavix along with metoprolol succinate and Pravachol  4. Chronic systolic CHF s/p biventricular ICD: Previous  echocardiogram in 2019 suggested EF of 45%.  We will repeat echocardiogram.  -Since her BNP was mildly elevated at 655.6, Dr. Claiborne Billings recommended a single dose of 40 mg Lasix to see her response.  There is still some suspicion that her abnormal chest x-ray could be correct in the fact that she has pneumonia, however interestingly she is afebrile and her white blood cell count is only borderline elevated.  Otherwise, I did not see any significant JVD or lower extremity edema on exam.  Right lung does have some crackles.  -On Toprol-XL 50 mg daily, lisinopril 20 mg daily  5. Hypertension: Blood pressure stable  6. Hyperlipidemia: On pravastatin at home  7. DM 2: Managed by primary care provider  8. Paroxysmal atrial fibrillation: She was not placed on anticoagulation therapy given her recurrence and the low A. fib burden  9. CAD s/p CABG: Continue aspirin Plavix  10. History of CVA      For questions or updates, please contact [place] Please consult [email or web address] for contact info under     Hilbert Corrigan, [place]  01/08/2020 4:34 PM    Patient seen and examined. Agree with assessment and plan.  Laurie [name] is a former patient of Dr. Wynonia Lawman and now sees Dr. Bettina Gavia as well as Dr. [name].  She has known CAD and underwent CABG revascularization surgery in 2008 last catheterization required stenting of her vein graft to a diagonal and vein graft to her complex marginal.  She had an occluded graft to her RCA and a patent LIMA to her LAD.  She is status post pacemaker insertion and there has been some concern for possible pacemaker syndrome leading to reduction in LV function however this may also be due to her underlying CAD.  Her most recent echo Doppler study has shown an  EF estimate of about 45%.  Morning she was found down and around 3:00 this morning by her husband.  She was fused.  She denied any prodrome of chest pain or awareness of tachypalpitations or seizure activity.  Upon  presenting to the emergency room mental status was altered.  She was severely hypoxic with low oxygen saturation Asian.  High-sensitivity troponin was initially elevated at 4694 with subsequent increased to 13 568.  ECG shows a paced rhythm.  Presently she is awake and somewhat slow in speech.  She did sustain some contusion to her right lateral face and forehead and she will be undergoing CT imaging.  On exam there is no obvious JVD.  Lungs were revealed decreased breath sounds at the bases without wheezing.  Rhythm was tachycardic and paced at approximately 105 bpm.  There with a 1/6 systolic murmur.  There was a suggestion of an S3 gallop.  Abdomen was protuberant and obese with positive bowel sounds.  There was no significant lower extremity edema.  She did not have any focal neurologic findings.  ECG shows an atrially sensed and ventricular paced rhythm at 119 bpm.  Is his concern for pneumonia versus CHF.  BNP is elevated at 655.  Recommend initial IV Lasix to induce diuresis and may improve resting tachycardia.  Resume metoprolol succinate initially at 25 mg.  Recommend 2D echo Doppler study to reassess systolic and diastolic function.  With significant troponin elevation, suspect she ultimately will require repeat cardiac catheterization for definitive ischemic evaluation.  In this patient with previous VG graft disease status post stent stenting of 2/3 vein grafts with occlusion of the other vein graft and a patent RIMA at last catheterization.  Troy Sine, MD, [facility] 01/08/2020 6:09 PM

## 2020-01-08 NOTE — ED Notes (Signed)
Cardiology PA in to assess pt

## 2020-01-08 NOTE — Progress Notes (Signed)
ANTICOAGULATION CONSULT NOTE - Initial Consult  Pharmacy Consult for heparin Indication: chest pain/ACS  Allergies  Allergen Reactions  . Brilinta [Ticagrelor] Shortness Of Breath  . Statins Other (See Comments)    Statins make the legs ache  . Tricor [Fenofibrate] Other (See Comments)    Leg pain  . Latex Itching and Other (See Comments)    Affected areas turn red, also-  . Tape Rash    Heart leads break out the skin    Patient Measurements: Height: 5\' 1"  (154.9 cm) Weight: 81.6 kg (180 lb) IBW/kg (Calculated) : 47.8 Heparin Dosing Weight: 66 kg   Vital Signs: Temp: 98.3 F (36.8 C) (04/27 1238) Temp Source: Oral (04/27 1238) BP: 125/85 (04/27 1515) Pulse Rate: 120 (04/27 1515)  Labs: Recent Labs    01/08/20 1241 01/08/20 1253 01/08/20 1500  HGB 16.5* 17.0*  --   HCT 48.6* 50.0*  --   PLT 237  --   --   CREATININE 1.00  --   --   CKTOTAL 421*  --   --   TROPONINIHS 4,694*  --  13,568*    Estimated Creatinine Clearance: 48.5 mL/min (by C-G formula based on SCr of 1 mg/dL).   Medical History: Past Medical History:  Diagnosis Date  . AICD (automatic cardioverter/defibrillator) present   . Bell's palsy 01/05/2010  . CAD (coronary artery disease)    Cath  07/21/1999  mild ostial, 80% stenosis proximal LAD, 90% stenosis proximal Diag 1, 95% stenosis proximal OM 1, 80% stenosis proximal RCA, 95% stenosis mid RCA    CABG 07/22/99  LIMA to LAD, SVG to dx, SVG to OM, SVG to PDA  Dr. [name]   Cardiolite 2012 no ischemia EF 67%  Cath 10/23/12 Severe native three-vessel coronary artery disease with occlusion of all 3 native coronary arteries, Patent sap  . Chronic lower back pain   . Chronic systolic CHF (congestive heart failure) ([place]) 10/22/2015  . Cystitis   . Hepatitis 1970s   "don't know which" (03/17/2017)  . HIstory of Bell's palsy    October 2012   . Hyperlipidemia    Intolerance to several statins   . Hypertensive heart disease without CHF   . Multinodular goiter    . Obesity (BMI 30-39.9)   . OSA on CPAP    "suppose to wear a mask" (03/17/2017)  . Peripheral neuropathy   . Pneumonia 1990s X 1   "think i had walking pneumonia"  . Seasonal allergies   . Stroke ([place]) 03/2017   "light one"; denies residual on 03/17/2017)  . Type II diabetes mellitus ([facility])     Medications:  (Not in a hospital admission)   Assessment: 61 YOF who presented with altered mental status now with elevated troponin. Pharmacy consulted to start IV heparin for ACS. H/H mildly elevated. Plt wnl. SCr wnl  Goal of Therapy:  Heparin level 0.3-0.7 units/ml Monitor platelets by anticoagulation protocol: Yes   Plan:  -Heparin 3500 units IV bolus then start IV heparin infusion at 850 units/hr -F/u 8 hr HL -Monitor daily HL, CBC and s/s of bleeding   Albertina Parr, PharmD., BCPS, BCCCP Clinical Pharmacist Clinical phone for 01/08/20 until 11pm: [phone] If after 11pm, please refer to [facility] for unit-specific pharmacist

## 2020-01-08 NOTE — Progress Notes (Signed)
MD aware of K-7.4 and is not accurate. Was also given lasix in the ED.

## 2020-01-08 NOTE — ED Notes (Signed)
NRB 02 changed to [place] at 4LPM

## 2020-01-08 NOTE — ED Triage Notes (Signed)
Pt arrives via EMS with complaints of AMS. Pt last seen normal 01/07/20 @2300 . Family found her on the floor this morning, called EMS . Upon arrival SpO2 in 70's. Placed on NRB. Family states pt has increasingly become more altered. Pt alert to self only in triage. SpO2 70% RA in triage  P 125 RR 22 T: 98.6 150/94 CBG 595

## 2020-01-08 NOTE — ED Provider Notes (Signed)
[facility] EMERGENCY DEPARTMENT Provider Note   CSN: [id number] Arrival date & time: 01/08/20  1221     History Chief Complaint  Patient presents with  . Altered Mental Status    Laurie [name] is a 74 y.o. female.  HPI   Patient presented to the ED for evaluation of altered mental status.  Patient was last seen normal by her family at about 11 PM.  This morning they found her lying on the floor and called EMS.  On arrival EMS noted that her oxygen saturation was in the 70s.  Patient was placed on a nonrebreather.  In the ED the patient is awake and alert.  She is able to answer my questions.  She does not recall what happened.  She is not able to give me information about her medical history.  Patient does not know if she had Covid or the Covid vaccination previously  Past Medical History:  Diagnosis Date  . AICD (automatic cardioverter/defibrillator) present   . Bell's palsy 01/05/2010  . CAD (coronary artery disease)    Cath  07/21/1999  mild ostial, 80% stenosis proximal LAD, 90% stenosis proximal Diag 1, 95% stenosis proximal OM 1, 80% stenosis proximal RCA, 95% stenosis mid RCA    CABG 07/22/99  LIMA to LAD, SVG to dx, SVG to OM, SVG to PDA  Dr. [name]   Cardiolite 2012 no ischemia EF 67%  Cath 10/23/12 Severe native three-vessel coronary artery disease with occlusion of all 3 native coronary arteries, Patent sap  . Chronic lower back pain   . Chronic systolic CHF (congestive heart failure) ([place]) 10/22/2015  . Cystitis   . Hepatitis 1970s   "don't know which" (03/17/2017)  . HIstory of Bell's palsy    October 2012   . Hyperlipidemia    Intolerance to several statins   . Hypertensive heart disease without CHF   . Multinodular goiter   . Obesity (BMI 30-39.9)   . OSA on CPAP    "suppose to wear a mask" (03/17/2017)  . Peripheral neuropathy   . Pneumonia 1990s X 1   "think i had walking pneumonia"  . Seasonal allergies   . Stroke ([place]) 03/2017   "light  one"; denies residual on 03/17/2017)  . Type II diabetes mellitus [facility])     Patient Active Problem List   Diagnosis Date Noted  . Paroxysmal atrial fibrillation ([place]) 08/04/2018  . Type 2 diabetes mellitus with diabetic neuropathy, unspecified ([place]) 01/09/2018  . Unstable angina ([place]) 10/27/2017  . Biventricular automatic implantable cardioverter defibrillator in situ 10/24/2015  . Chronic systolic CHF (congestive heart failure) ([place]) 10/22/2015  . S/P CABG (coronary artery bypass graft)   . Hyperlipidemia 04/18/2012  . Obesity (BMI 30-39.9)   . Sleep apnea   . Peripheral neuropathy   . HIstory of Bell's palsy   . Multinodular goiter   . Hypertensive heart disease   . CAD (coronary artery disease)     Past Surgical History:  Procedure Laterality Date  . BI-VENTRICULAR PACEMAKER UPGRADE  11/05/2015   "upgraded my pacemaker"  . BIOPSY THYROID  05/2010   percutaneous  . CARDIAC CATHETERIZATION N/A 10/21/2015   Procedure: Left Heart Cath and Cors/Grafts Angiography;  Surgeon: Belva Crome, MD;  Location: [facility] CV LAB;  Service: Cardiovascular;  Laterality: N/A;  . CARPAL TUNNEL RELEASE Bilateral   . CATARACT EXTRACTION Right 01/06/2015  . CATARACT EXTRACTION W/ INTRAOCULAR LENS IMPLANT Left 12/16/2014  . CORONARY ARTERY BYPASS GRAFT  2000  . CORONARY STENT INTERVENTION N/A 10/28/2017   Procedure: CORONARY STENT INTERVENTION;  Surgeon: Jettie Booze, MD;  Location: [facility] CV LAB;  Service: Cardiovascular;  Laterality: N/A;  . CYSTECTOMY Right    "middle finger"  . DENTAL IMPLANTS    . EP IMPLANTABLE DEVICE N/A 11/05/2015   Procedure: BiV Upgrade;  Surgeon: Deboraha [place]ang, MD;  Location: [facility] CV LAB;  Service: Cardiovascular;  Laterality: N/A;  . INSERT / REPLACE / REMOVE PACEMAKER    . LEAD REVISION N/A 10/25/2012   Procedure: LEAD REVISION;  Surgeon: Evans Lance, MD;  Location: [facility] CATH LAB;  Service: Cardiovascular;  Laterality: N/A;  . LEFT HEART CATH AND  CORS/GRAFTS ANGIOGRAPHY N/A 10/28/2017   Procedure: LEFT HEART CATH AND CORS/GRAFTS ANGIOGRAPHY;  Surgeon: Jettie Booze, MD;  Location: [facility] CV LAB;  Service: Cardiovascular;  Laterality: N/A;  . LEFT HEART CATHETERIZATION WITH CORONARY ANGIOGRAM N/A 10/23/2012   Procedure: LEFT HEART CATHETERIZATION WITH CORONARY ANGIOGRAM;  Surgeon: Jacolyn Reedy, MD;  Location: [facility] CATH LAB;  Service: Cardiovascular;  Laterality: N/A;  . PERMANENT PACEMAKER INSERTION N/A 10/23/2012   Procedure: PERMANENT PACEMAKER INSERTION;  Surgeon: Deboraha [place]ang, MD;  Location: [facility] CATH LAB;  Service: Cardiovascular;  Laterality: N/A;  . PLANTAR FASCIA RELEASE Left    "& clipped a tendon that went thru bottom of my foot"  . TEMPORARY PACEMAKER INSERTION N/A 10/22/2012   Procedure: TEMPORARY PACEMAKER INSERTION;  Surgeon: Troy Sine, MD;  Location: [facility] CATH LAB;  Service: Cardiovascular;  Laterality: N/A;  . TUBAL LIGATION       OB History   No obstetric history on file.     Family History  Problem Relation Age of Onset  . Diabetes Father   . Heart disease Father   . Diabetes Mother   . Diabetes Other        Siblings and 2 children  . Heart disease Other        CAD  . Colon cancer Neg Hx   . Colon polyps Neg Hx     Social History   Tobacco Use  . Smoking status: Never Smoker  . Smokeless tobacco: Never Used  Substance Use Topics  . Alcohol use: No    Alcohol/week: 0.0 standard drinks  . Drug use: No    Home Medications Prior to Admission medications   Medication Sig Start Date End Date Taking? Authorizing Provider  aspirin 81 MG tablet Take 1 tablet (81 mg total) by mouth daily. 10/29/17  Yes Bhagat, Bhavinkumar, PA  beta carotene w/minerals (OCUVITE) tablet Take 1 tablet by mouth daily.   Yes [provider]  cetirizine (ZYRTEC) 10 MG tablet Take 1 tablet (10 mg total) by mouth daily. 03/20/17  Yes Velvet Bathe, MD  cholecalciferol (VITAMIN D) 1000 UNITS tablet Take 1,000  Units by mouth daily.    Yes [provider]  clopidogrel (PLAVIX) 75 MG tablet Take 1 tablet (75 mg total) by mouth daily. 12/06/18  Yes Richardo Priest, MD  furosemide (LASIX) 40 MG tablet TAKE 2 TABLETS BY MOUTH EVERY MORNING AND 1 TABLET EVERY EVENING. TAKE 1 ADDITIONAL TABLET FOR WEIGHT GAIN OF 3LBS OR MORE Patient taking differently: Take 40-80 mg by mouth See admin instructions. TAKE 2 TABLETS BY MOUTH EVERY MORNING AND 1 TABLET EVERY EVENING. TAKE 1 ADDITIONAL TABLET FOR WEIGHT GAIN OF 3LBS OR MORE 01/30/19  Yes Billie Ruddy, MD  ketoconazole (NIZORAL) 2 % cream Apply 1  application topically daily as needed for irritation. TO AFFECTED AREAS OF FACE    Yes [provider]  lisinopril (PRINIVIL,ZESTRIL) 20 MG tablet Take 1 tablet (20 mg total) by mouth daily. 12/08/18  Yes Richardo Priest, MD  metoprolol succinate (TOPROL-XL) 50 MG 24 hr tablet Take 1 tablet (50 mg total) by mouth daily. Take with or immediately following a meal. 12/04/18  Yes Munley, Hilton Cork, MD  nitroGLYCERIN (NITROSTAT) 0.4 MG SL tablet Place 0.4 mg under the tongue every 5 (five) minutes as needed for chest pain.  06/22/16  Yes [provider]  potassium chloride SA (KLOR-CON M20) 20 MEQ tablet Take 1 tablet (20 mEq total) by mouth daily. 11/27/18  Yes Deboraha [place]ang, MD  pravastatin (PRAVACHOL) 40 MG tablet TAKE 1 TABLET BY MOUTH ONCE DAILY 6 IN THE EVENING Patient taking differently: Take 40 mg by mouth daily. TAKE 1 TABLET BY MOUTH ONCE DAILY 6 IN THE EVENING 01/30/19  Yes Billie Ruddy, MD  Probiotic Product (PROBIOTIC DAILY) CAPS Take 1 capsule by mouth daily.   Yes [provider]  psyllium (METAMUCIL) 58.6 % powder Take 1 packet by mouth daily as needed (fiber).    Yes [provider]  vitamin C (ASCORBIC ACID) 500 MG tablet Take 500 mg by mouth every other day.   Yes [provider]  ACCU-CHEK FASTCLIX LANCETS MISC USE  TO CHECK BLOOD SUGAR FOUR TIMES  DAILY Patient taking differently: 1 each by Other route 3 (three) times daily.  07/20/16   Renato Shin, MD  ACCU-CHEK SMARTVIEW test strip USE  TO CHECK BLOOD SUGAR FOUR TIMES DAILY Patient taking differently: 1 each by Other route 3 (three) times daily. USE  TO CHECK BLOOD SUGAR THREE TIMES DAILY 08/29/17   Renato Shin, MD  Alcohol Swabs (B-D SINGLE USE SWABS REGULAR) PADS Use 8 daily for testing blood sugar and insulin injections. 09/12/14   Renato Shin, MD  BD VEO INSULIN SYRINGE U/F 31G X 15/64" 1 ML MISC USE 10 TIMES A DAY WITH INSULIN AS DIRECTED 10/08/18   Renato Shin, MD  Blood Glucose Calibration (ACCU-CHEK SMARTVIEW CONTROL) LIQD Use to calibrate blood glucose meter. 09/12/14   Renato Shin, MD  Continuous Blood Gluc Sensor (FREESTYLE LIBRE 14 DAY SENSOR) MISC APPLY EVERY 14 DAYS 10/27/18   Renato Shin, MD  Insulin Glargine, 2 Unit Dial, (TOUJEO MAX SOLOSTAR) 300 UNIT/ML SOPN Inject 600 Units into the skin every morning. Patient not taking: Reported on 01/08/2020 04/13/19   Renato Shin, MD  Insulin Pen Needle (BD PEN NEEDLE NANO U/F) 32G X 4 MM MISC 1 each by Does not apply route 3 (three) times daily. Use to inject insulin TID; E11.40 01/17/19   Renato Shin, MD    Allergies    Brilinta [ticagrelor], Statins, Tricor [fenofibrate], Latex, and Tape  Review of Systems   Review of Systems  All other systems reviewed and are negative.   Physical Exam Updated Vital Signs BP (!) 111/93   Pulse (!) 121   Temp 98.3 F (36.8 C) (Oral)   Resp (!) 22   Ht 1.549 m (5\' 1" )   Wt 81.6 kg   SpO2 93% Comment: 15 liters NRB  BMI 34.01 kg/m   Physical Exam Vitals and nursing note reviewed.  Constitutional:      Appearance: She is well-developed. She is ill-appearing.  HENT:     Head: Normocephalic and atraumatic.     Right Ear: External ear normal.  Left Ear: External ear normal.  Eyes:     General: No scleral icterus.       Right eye: No discharge.        Left eye: No  discharge.     Conjunctiva/sclera: Conjunctivae normal.  Neck:     Trachea: No tracheal deviation.  Cardiovascular:     Rate and Rhythm: Regular rhythm. Tachycardia present.  Pulmonary:     Effort: Pulmonary effort is normal. No respiratory distress.     Breath sounds: No stridor. Wheezing and rales present.  Abdominal:     General: Bowel sounds are normal. There is no distension.     Palpations: Abdomen is soft.     Tenderness: There is no abdominal tenderness. There is no guarding or rebound.  Musculoskeletal:        General: No tenderness.     Cervical back: Neck supple.     Right lower leg: Edema present.     Left lower leg: Edema present.  Skin:    General: Skin is warm and dry.     Findings: No rash.  Neurological:     Mental Status: She is alert.     Cranial Nerves: No cranial nerve deficit (no facial droop, extraocular movements intact, no slurred speech).     Sensory: No sensory deficit.     Motor: No abnormal muscle tone or seizure activity.     Coordination: Coordination normal.     ED Results / Procedures / Treatments   Labs (all labs ordered are listed, but only abnormal results are displayed) Labs Reviewed  COMPREHENSIVE METABOLIC PANEL - Abnormal; Notable for the following components:      Result Value   Potassium 5.5 (*)    CO2 18 (*)    Glucose, Bld 467 (*)    AST 66 (*)    Total Bilirubin 1.5 (*)    GFR calc non Af Amer 56 (*)    Anion gap 17 (*)    All other components within normal limits  CBC WITH DIFFERENTIAL/PLATELET - Abnormal; Notable for the following components:   WBC 12.5 (*)    RBC 5.36 (*)    Hemoglobin 16.5 (*)    HCT 48.6 (*)    Neutro Abs 11.1 (*)    All other components within normal limits  CK - Abnormal; Notable for the following components:   Total CK 421 (*)    All other components within normal limits  POCT I-STAT EG7 - Abnormal; Notable for the following components:   pCO2, Ven 32.6 (*)    pO2, Ven 66.0 (*)    Sodium 133  (*)    Potassium 7.4 (*)    Calcium, Ion 1.03 (*)    HCT 50.0 (*)    Hemoglobin 17.0 (*)    All other components within normal limits  TROPONIN I (HIGH SENSITIVITY) - Abnormal; Notable for the following components:   Troponin I (High Sensitivity) 4,694 (*)    All other components within normal limits  RESPIRATORY PANEL BY RT PCR (FLU A&B, COVID)  URINALYSIS, ROUTINE W REFLEX MICROSCOPIC  BRAIN NATRIURETIC PEPTIDE  I-STAT ARTERIAL BLOOD GAS, ED  TROPONIN I (HIGH SENSITIVITY)    EKG EKG Interpretation  Date/Time:  Tuesday January 08 2020 12:25:21 EDT Ventricular Rate:  125 PR Interval:    QRS Duration: 131 QT Interval:  351 QTC Calculation: 507 R Axis:   -97 Text Interpretation: Sinus tachycardia vs atrial sense with v pacing Right bundle branch block Probable RV involvement,  suggest recording right precordial leads No significant change since last tracing Confirmed by Dorie Rank [phone]) on 01/08/2020 12:29:46 PM   Radiology DG Chest Port 1 View  Result Date: 01/08/2020 CLINICAL DATA:  Dyspnea. EXAM: PORTABLE CHEST 1 VIEW COMPARISON:  October 29, 2017. FINDINGS: Stable cardiomediastinal silhouette. Status post coronary bypass graft. Left-sided pacemaker is unchanged in position. No pneumothorax or pleural effusion is noted. Left lung is clear. New large diffuse right lung opacity is noted most consistent with pneumonia. Bony thorax is unremarkable. IMPRESSION: New large diffuse right lung opacity is noted most consistent with pneumonia. Electronically Signed   By: Marijo Conception M.D.   On: 01/08/2020 13:55    Procedures .Critical Care Performed by: Dorie Rank, MD Authorized by: Dorie Rank, MD   Critical care provider statement:    Critical care time (minutes):  45   Critical care was time spent personally by me on the following activities:  Discussions with consultants, evaluation of patient's response to treatment, examination of patient, ordering and performing treatments and  interventions, ordering and review of laboratory studies, ordering and review of radiographic studies, pulse oximetry, re-evaluation of patient's condition, obtaining history from patient or surrogate and review of old charts   (including critical care time)  Medications Ordered in ED Medications  nitroGLYCERIN (NITROGLYN) 2 % ointment 0.5 inch (0.5 inches Topical Given 01/08/20 1359)  cefTRIAXone (ROCEPHIN) 1 g in sodium chloride 0.9 % 100 mL IVPB (has no administration in time range)  azithromycin (ZITHROMAX) tablet 500 mg (has no administration in time range)  furosemide (LASIX) injection 40 mg (40 mg Intravenous Given 01/08/20 1359)    ED Course  I have reviewed the triage vital signs and the nursing notes.  Pertinent labs & imaging results that were available during my care of the patient were reviewed by me and considered in my medical decision making (see chart for details).  Clinical Course as of Jan 07 1541  Tue Jan 08, 2020  1354 Chest x-ray reviewed.  Suggestive of pulmonary edema vs pna.  Radiologist review favors pna   [JK]  1355 Nitroglycerin paste and Lasix ordered   [JK]  1355 Will start abx.  Cover for pna.  Covid test negative.   [JK]  1454 Troponin is severely elevated.  S[place]gets non-ST elevation MI   [JK]  1505 Discussed with Trish, cardiology.  They will evaluate pt in the ED   [JK]    Clinical Course User Index [JK] Dorie Rank, MD   MDM Rules/Calculators/A&P                      Patient presented to the ED for evaluation of altered mental status.  Patient has new significant oxygen requirements.  I suspect her increased confusion is related to her hypoxia.  Patient's chest x-ray suggests pneumonia and she was treated with IV antibiotics.  I am concerned about the possibility of component of CHF with her significantly elevated troponin.  NSTEMI is also a concern.  Patient however denies having any chest pain.  Her confusion however is a factor.  BNP is pending.   Her ABG does not show any respiratory acidosis but is consistent with her acute hypoxemia.  Patient has been started on antibiotics.  Also covered her with diuretics and nitroglycerin for possible CHF exacerbation.  I have consulted with cardiology.  Considering her confusion and pneumonia I will consult the medical service for admission.   Covid test is negative. Final Clinical Impression(s) /  ED Diagnoses Final diagnoses:  Altered mental status, unspecified altered mental status type  Acute hypoxemic respiratory failure ([place])  Community acquired pneumonia of right lung, unspecified part of lung  NSTEMI (non-ST elevated myocardial infarction) ([facility])      Dorie Rank, MD 01/08/20 1545

## 2020-01-08 NOTE — H&P (Addendum)
History and Physical    Laurie [name] [id number] DOB: 06/09/1946 DOA: 01/08/2020  PCP: Haywood Pao, MD   Chief Complaint: Found down  HPI: Laurie [name] is a 74 y.o. female with medical history significant for CAD, obstructive with four-vessel CABG in 2000, status post AICD placement, chronic systolic heart failure, EF 45% March 2019, unspecified hepatitis, hyperlipidemia, multinodular goiter, obstructive sleep apnea on CPAP since 2018, peripheral neuropathy, TIA, insulin-dependent type 2 diabetes with questionable history of dementia who presents after being found down per report in the ED.  Patient is a very poor historian given her current state with questionable history of dementia.  Majority of history comes from chart review, physician in the ED and family.  Per husband patient has been somewhat altered over the past 3 to 4 days with worsening memory but no formal diagnosis of dementia, she had very poor p.o. intake but otherwise no complaints other than "sciatic hip pain" and that she is reported no nausea vomiting diarrhea constipation headache fevers or chills to the best of his knowledge, again the patient has been a poor historian for him as well over the past few days.  This morning at approximately 3 AM the patient's husband awoke to loud noise and found the patient in the living room pinned against a wall after what appears to have been a fall near the couch, to him it appears that she had struck her head on the wall as she was laying in that direction.  Patient declines any falls recently.  Patient's husband also indicates she has had very poor p.o. intake over the past few days to week again without clear etiology.  Denies any new medications, changes in medications or diet other than decreased intake.  Patient was noted to be hypoxic into the 70s per EMS upon arrival.  Placed on nonrebreather and brought to the ED.  In the ED patient had notable hyperkalemia, elevated  troponin, elevated BNP and minimally elevated CK -received Lasix IV 40x1.  Chest x-ray remarkable for large right-sided opacification, EKG notable for ventricularly paced tachycardia.  Cardiology was consulted by the ED for elevated troponin.  Hospitalist was called to admit patient for further evaluation treatment of other comorbid conditions.  Assessment/Plan Active Problems:   Multinodular goiter   Hypertensive heart disease   CAD (coronary artery disease)   Hyperlipidemia   Sleep apnea   Biventricular automatic implantable cardioverter defibrillator in situ   S/P CABG (coronary artery bypass graft)   Chronic systolic CHF (congestive heart failure) (HCC)   Type 2 diabetes mellitus with diabetic neuropathy, unspecified (HCC)   Acute respiratory failure with hypoxia (HCC)   Elevated troponin   Acute hypoxic respiratory failure in the setting of sepsis and CAP versus aspiration pneumonia, POA -Patient placed on nonrebreather in route, able to wean down to 3 L nasal cannula in the ED -Continue to wean oxygen as tolerated -Broad-spectrum coverage including ceftriaxone and azithromycin for commune acquired pneumonia versus aspiration pneumonia -Without wheeze, hold steroids given mental status changes as below and patient's advanced age -Continue nebs, supportive care -Speech eval at bedside then advance diet as tolerated  Elevated troponin, likely NSTEMI for supply demand mismatch History of obstructive CAD status post multiple stent placement and CABG Heart failure, systolic, does not appear to be in acute exacerbation status post AICD placement -Cardiology following, appreciate insight and recommendations -Troponin trending upwards, already on heparin drip in ED -BNP minimally elevated, status post Lasix 40x1, appears euvolemic at  this time without JVD or peripheral edema -EKG as below atrial sensed, ventricular paced, tachycardic with wide-complex without overt ST elevations or  depressions -Continue euvolemia, resume home medications and core measures once verified  Acute metabolic encephalopathy, likely multifactorial in setting of above infection, hypoxia, rule out polypharmacy -Unclear etiology, likely in the setting of acute hypoxia and possible CAP versus aspiration pneumonia -Husband declines any recent medication changes -Once medications have been verified by pharmacy will evaluate for potential causes of sedation -CT head pending given unspecified fall and patient found down by husband overnight  Hyperkalemia,mild -5.5 at admission, status post Lasix 40x 1 -Stop home potassium -Follow with morning labs  Ambulatory dysfunction, likely multifactorial in the setting of above, POA -Continue PT OT, patient states wife walks with a walker at home at baseline  Obstructive sleep apnea on CPAP since 2018 -Resume CPAP as tolerated at night and while sleeping  DVT prophylaxis: Heparin drip Code Status: Full per husband -consistent with previous documentation Family Communication: Husband noted over the phone Disposition Plan: Inpatient, pending clinical course likely require additional 3 to 4 days of IV fluids, antibiotics, oxygen, further evaluation with cardiology and possible need for procedure per their expertise -ultimate disposition yet to be determined, patient from home Consults called: Cardiology Admission status: Inpatient, continues to require IV fluids, antibiotics, oxygen, further evaluation with cardiology and possible need for procedure per their expertise   Past Medical History:  Diagnosis Date  . AICD (automatic cardioverter/defibrillator) present   . Bell's palsy 01/05/2010  . CAD (coronary artery disease)    Cath  07/21/1999  mild ostial, 80% stenosis proximal LAD, 90% stenosis proximal Diag 1, 95% stenosis proximal OM 1, 80% stenosis proximal RCA, 95% stenosis mid RCA    CABG 07/22/99  LIMA to LAD, SVG to dx, SVG to OM, SVG to PDA  Dr. [name]    Cardiolite 2012 no ischemia EF 67%  Cath 10/23/12 Severe native three-vessel coronary artery disease with occlusion of all 3 native coronary arteries, Patent sap  . Chronic lower back pain   . Chronic systolic CHF (congestive heart failure) ([place]) 10/22/2015  . Cystitis   . Hepatitis 1970s   "don't know which" (03/17/2017)  . HIstory of Bell's palsy    October 2012   . Hyperlipidemia    Intolerance to several statins   . Hypertensive heart disease without CHF   . Multinodular goiter   . Obesity (BMI 30-39.9)   . OSA on CPAP    "suppose to wear a mask" (03/17/2017)  . Peripheral neuropathy   . Pneumonia 1990s X 1   "think i had walking pneumonia"  . Seasonal allergies   . Stroke ([place]) 03/2017   "light one"; denies residual on 03/17/2017)  . Type II diabetes mellitus ([place])     Past Surgical History:  Procedure Laterality Date  . BI-VENTRICULAR PACEMAKER UPGRADE  11/05/2015   "upgraded my pacemaker"  . BIOPSY THYROID  05/2010   percutaneous  . CARDIAC CATHETERIZATION N/A 10/21/2015   Procedure: Left Heart Cath and Cors/Grafts Angiography;  Surgeon: Belva Crome, MD;  Location: [place] [facility];  Service: Cardiovascular;  Laterality: N/A;  . CARPAL TUNNEL RELEASE Bilateral   . CATARACT EXTRACTION Right 01/06/2015  . CATARACT EXTRACTION W/ INTRAOCULAR LENS IMPLANT Left 12/16/2014  . CORONARY ARTERY BYPASS GRAFT  2000  . CORONARY STENT INTERVENTION N/A 10/28/2017   Procedure: CORONARY STENT INTERVENTION;  Surgeon: Jettie Booze, MD;  Location: [place] [facility];  Service:  Cardiovascular;  Laterality: N/A;  . CYSTECTOMY Right    "middle finger"  . DENTAL IMPLANTS    . EP IMPLANTABLE DEVICE N/A 11/05/2015   Procedure: BiV Upgrade;  Surgeon: Deboraha Sprang, MD;  Location: [place] [facility];  Service: Cardiovascular;  Laterality: N/A;  . INSERT / REPLACE / REMOVE PACEMAKER    . LEAD REVISION N/A 10/25/2012   Procedure: LEAD REVISION;  Surgeon: Evans Lance, MD;  Location: [facility] CATH LAB;   Service: Cardiovascular;  Laterality: N/A;  . LEFT HEART CATH AND CORS/GRAFTS ANGIOGRAPHY N/A 10/28/2017   Procedure: LEFT HEART CATH AND CORS/GRAFTS ANGIOGRAPHY;  Surgeon: Jettie Booze, MD;  Location: [place] [facility];  Service: Cardiovascular;  Laterality: N/A;  . LEFT HEART CATHETERIZATION WITH CORONARY ANGIOGRAM N/A 10/23/2012   Procedure: LEFT HEART CATHETERIZATION WITH CORONARY ANGIOGRAM;  Surgeon: Jacolyn Reedy, MD;  Location: [facility] CATH LAB;  Service: Cardiovascular;  Laterality: N/A;  . PERMANENT PACEMAKER INSERTION N/A 10/23/2012   Procedure: PERMANENT PACEMAKER INSERTION;  Surgeon: Deboraha Sprang, MD;  Location: [facility] CATH LAB;  Service: Cardiovascular;  Laterality: N/A;  . PLANTAR FASCIA RELEASE Left    "& clipped a tendon that went thru bottom of my foot"  . TEMPORARY PACEMAKER INSERTION N/A 10/22/2012   Procedure: TEMPORARY PACEMAKER INSERTION;  Surgeon: Troy Sine, MD;  Location: [facility] CATH LAB;  Service: Cardiovascular;  Laterality: N/A;  . TUBAL LIGATION       reports that she has never smoked. She has never used smokeless tobacco. She reports that she does not drink alcohol or use drugs.  Allergies  Allergen Reactions  . Brilinta [Ticagrelor] Shortness Of Breath  . Statins Other (See Comments)    Statins make the legs ache  . Tricor [Fenofibrate] Other (See Comments)    Leg pain  . Latex Itching and Other (See Comments)    Affected areas turn red, also-  . Tape Rash    Heart leads break out the skin    Family History  Problem Relation Age of Onset  . Diabetes Father   . Heart disease Father   . Diabetes Mother   . Diabetes Other        Siblings and 2 children  . Heart disease Other        CAD  . Colon cancer Neg Hx   . Colon polyps Neg Hx     Prior to Admission medications   Medication Sig Start Date End Date Taking? Authorizing Provider  aspirin 81 MG tablet Take 1 tablet (81 mg total) by mouth daily. 10/29/17  Yes Bhagat, Bhavinkumar, PA  beta  carotene w/minerals (OCUVITE) tablet Take 1 tablet by mouth daily.   Yes [provider]  cetirizine (ZYRTEC) 10 MG tablet Take 1 tablet (10 mg total) by mouth daily. 03/20/17  Yes Velvet Bathe, MD  cholecalciferol (VITAMIN D) 1000 UNITS tablet Take 1,000 Units by mouth daily.    Yes [provider]  clopidogrel (PLAVIX) 75 MG tablet Take 1 tablet (75 mg total) by mouth daily. 12/06/18  Yes Richardo Priest, MD  furosemide (LASIX) 40 MG tablet TAKE 2 TABLETS BY MOUTH EVERY MORNING AND 1 TABLET EVERY EVENING. TAKE 1 ADDITIONAL TABLET FOR WEIGHT GAIN OF 3LBS OR MORE Patient taking differently: Take 40-80 mg by mouth See admin instructions. TAKE 2 TABLETS BY MOUTH EVERY MORNING AND 1 TABLET EVERY EVENING. TAKE 1 ADDITIONAL TABLET FOR WEIGHT GAIN OF 3LBS OR MORE 01/30/19  Yes Billie Ruddy, MD  ketoconazole (NIZORAL) 2 % cream Apply 1 application topically daily as needed for irritation. TO AFFECTED AREAS OF FACE    Yes [provider]  lisinopril (PRINIVIL,ZESTRIL) 20 MG tablet Take 1 tablet (20 mg total) by mouth daily. 12/08/18  Yes Richardo Priest, MD  metoprolol succinate (TOPROL-XL) 50 MG 24 hr tablet Take 1 tablet (50 mg total) by mouth daily. Take with or immediately following a meal. 12/04/18  Yes Munley, Hilton Cork, MD  nitroGLYCERIN (NITROSTAT) 0.4 MG SL tablet Place 0.4 mg under the tongue every 5 (five) minutes as needed for chest pain.  06/22/16  Yes [provider]  potassium chloride SA (KLOR-CON M20) 20 MEQ tablet Take 1 tablet (20 mEq total) by mouth daily. 11/27/18  Yes Deboraha Sprang, MD  pravastatin (PRAVACHOL) 40 MG tablet TAKE 1 TABLET BY MOUTH ONCE DAILY 6 IN THE EVENING Patient taking differently: Take 40 mg by mouth daily. TAKE 1 TABLET BY MOUTH ONCE DAILY 6 IN THE EVENING 01/30/19  Yes Billie Ruddy, MD  Probiotic Product (PROBIOTIC DAILY) CAPS Take 1 capsule by mouth daily.   Yes [provider]  psyllium (METAMUCIL) 58.6 % powder Take 1  packet by mouth daily as needed (fiber).    Yes [provider]  vitamin C (ASCORBIC ACID) 500 MG tablet Take 500 mg by mouth every other day.   Yes [provider]  ACCU-CHEK FASTCLIX LANCETS MISC USE  TO CHECK BLOOD SUGAR FOUR TIMES DAILY Patient taking differently: 1 each by Other route 3 (three) times daily.  07/20/16   Renato Shin, MD  ACCU-CHEK SMARTVIEW test strip USE  TO CHECK BLOOD SUGAR FOUR TIMES DAILY Patient taking differently: 1 each by Other route 3 (three) times daily. USE  TO CHECK BLOOD SUGAR THREE TIMES DAILY 08/29/17   Renato Shin, MD  Alcohol Swabs (B-D SINGLE USE SWABS REGULAR) PADS Use 8 daily for testing blood sugar and insulin injections. 09/12/14   Renato Shin, MD  BD VEO INSULIN SYRINGE U/F 31G X 15/64" 1 ML MISC USE 10 TIMES A DAY WITH INSULIN AS DIRECTED 10/08/18   Renato Shin, MD  Blood Glucose Calibration (ACCU-CHEK SMARTVIEW CONTROL) LIQD Use to calibrate blood glucose meter. 09/12/14   Renato Shin, MD  Continuous Blood Gluc Sensor (FREESTYLE LIBRE 14 DAY SENSOR) MISC APPLY EVERY 14 DAYS 10/27/18   Renato Shin, MD  Insulin Glargine, 2 Unit Dial, (TOUJEO MAX SOLOSTAR) 300 UNIT/ML SOPN Inject 600 Units into the skin every morning. Patient not taking: Reported on 01/08/2020 04/13/19   Renato Shin, MD  Insulin Pen Needle (BD PEN NEEDLE NANO U/F) 32G X 4 MM MISC 1 each by Does not apply route 3 (three) times daily. Use to inject insulin TID; E11.40 01/17/19   Renato Shin, MD    Physical Exam: Vitals:   01/08/20 1446 01/08/20 1515 01/08/20 1600 01/08/20 1630  BP:  125/85 125/66 127/74  Pulse:  (!) 120  (!) 118  Resp:  (!) 26 20 (!) 23  Temp:      TempSrc:      SpO2:  96%  98%  Weight: 81.6 kg     Height: 5\' 1"  (1.549 m)       Constitutional: NAD, calm, comfortable Vitals:   01/08/20 1446 01/08/20 1515 01/08/20 1600 01/08/20 1630  BP:  125/85 125/66 127/74  Pulse:  (!) 120  (!) 118  Resp:  (!) 26 20 (!) 23  Temp:      TempSrc:  SpO2:  96%  98%  Weight: 81.6 kg     Height: 5\' 1"  (1.549 m)      General:  Pleasantly resting in bed, No acute distress.  Alert to person and place only HEENT:  Normocephalic ecchymosis superior-lateral to the right eye.  Sclerae nonicteric, noninjected.  Extraocular movements intact bilaterally. Neck:  Without mass or deformity.  Trachea is midline. Lungs:  Clear to auscultate bilaterally without rhonchi, wheeze, or rales. Heart: Tachycardic regular rhythm.  Without overt murmurs, rubs, or gallops. Abdomen:  Soft, nontender, nondistended.  Without guarding or rebound. Extremities: Without cyanosis, clubbing, edema, or obvious deformity. Vascular:  Dorsalis pedis and posterior tibial pulses palpable bilaterally. Skin:  Warm and dry, no erythema, no ulcerations.  Labs on Admission: I have personally reviewed following labs and imaging studies  CBC: Recent Labs  Lab 01/08/20 1241 01/08/20 1253  WBC 12.5*  --   NEUTROABS 11.1*  --   HGB 16.5* 17.0*  HCT 48.6* 50.0*  MCV 90.7  --   PLT 237  --    Basic Metabolic Panel: Recent Labs  Lab 01/08/20 1241 01/08/20 1253  NA 138 133*  K 5.5* 7.4*  CL 103  --   CO2 18*  --   GLUCOSE 467*  --   BUN 17  --   CREATININE 1.00  --   CALCIUM 9.6  --    GFR: Estimated Creatinine Clearance: 48.5 mL/min (by C-G formula based on SCr of 1 mg/dL). Liver Function Tests: Recent Labs  Lab 01/08/20 1241  AST 66*  ALT 28  ALKPHOS 69  BILITOT 1.5*  PROT 7.3  ALBUMIN 3.6   No results for input(s): LIPASE, AMYLASE in the last 168 hours. No results for input(s): AMMONIA in the last 168 hours. Coagulation Profile: No results for input(s): INR, PROTIME in the last 168 hours. Cardiac Enzymes: Recent Labs  Lab 01/08/20 1241  CKTOTAL 421*   BNP (last 3 results) No results for input(s): PROBNP in the last 8760 hours. HbA1C: No results for input(s): HGBA1C in the last 72 hours. CBG: No results for input(s): GLUCAP in the last 168  hours. Lipid Profile: No results for input(s): CHOL, HDL, LDLCALC, TRIG, CHOLHDL, LDLDIRECT in the last 72 hours. Thyroid Function Tests: No results for input(s): TSH, T4TOTAL, FREET4, T3FREE, THYROIDAB in the last 72 hours. Anemia Panel: No results for input(s): VITAMINB12, FOLATE, FERRITIN, TIBC, IRON, RETICCTPCT in the last 72 hours. Urine analysis:    Component Value Date/Time   COLORURINE YELLOW 10/20/2015 1435   APPEARANCEUR CLOUDY (A) 10/20/2015 1435   LABSPEC 1.010 10/20/2015 1435   PHURINE 5.5 10/20/2015 1435   GLUCOSEU NEGATIVE 10/20/2015 1435   HGBUR LARGE (A) 10/20/2015 1435   HGBUR negative 03/21/2009 0857   BILIRUBINUR NEGATIVE 10/20/2015 1435   BILIRUBINUR neg 06/03/2011 1217   KETONESUR NEGATIVE 10/20/2015 1435   PROTEINUR 30 (A) 10/20/2015 1435   UROBILINOGEN 0.2 06/03/2011 1217   UROBILINOGEN 0.2 03/21/2009 0857   NITRITE NEGATIVE 10/20/2015 1435   LEUKOCYTESUR LARGE (A) 10/20/2015 1435    Radiological Exams on Admission: DG Chest Port 1 View  Result Date: 01/08/2020 CLINICAL DATA:  Dyspnea. EXAM: PORTABLE CHEST 1 VIEW COMPARISON:  October 29, 2017. FINDINGS: Stable cardiomediastinal silhouette. Status post coronary bypass graft. Left-sided pacemaker is unchanged in position. No pneumothorax or pleural effusion is noted. Left lung is clear. New large diffuse right lung opacity is noted most consistent with pneumonia. Bony thorax is unremarkable. IMPRESSION: New large diffuse right lung opacity  is noted most consistent with pneumonia. Electronically Signed   By: Marijo Conception M.D.   On: 01/08/2020 13:55    EKG: Independently reviewed. A-sensed V-paced, tachycardic with wide complex QRS - without obvious ST or J point elevations, questionable anterior lead ST depressions in the setting of profound hypoxia  Little Ishikawa DO [facility] Pager: Epic secure chat  If 7PM-7AM, please contact night-coverage [email or web address]   01/08/2020, 6:01 PM

## 2020-01-08 NOTE — ED Notes (Signed)
Spoke with Pt daughter and provided update. Daughter states pt usually oriented to self, place and situation at baseline.

## 2020-01-08 NOTE — Progress Notes (Signed)
Patient arrived form ED.Has been in a red and yellow mews score. Upon arrival was in a yellow mews. A new set of vitals obtained and still yellow. MD is aware of the unchanged yellow mews.

## 2020-01-09 ENCOUNTER — Inpatient Hospital Stay (HOSPITAL_COMMUNITY): Payer: Medicare HMO

## 2020-01-09 ENCOUNTER — Encounter (HOSPITAL_COMMUNITY)
Admission: EM | Disposition: A | Discharge status: Home Health | Payer: Self-pay | Source: Home / Self Care | Attending: Internal Medicine

## 2020-01-09 ENCOUNTER — Other Ambulatory Visit: Payer: Self-pay

## 2020-01-09 ENCOUNTER — Encounter (HOSPITAL_COMMUNITY): Payer: Self-pay | Admitting: Internal Medicine

## 2020-01-09 DIAGNOSIS — I25810 Atherosclerosis of coronary artery bypass graft(s) without angina pectoris: Secondary | ICD-10-CM | POA: Diagnosis not present

## 2020-01-09 DIAGNOSIS — Z95810 Presence of automatic (implantable) cardiac defibrillator: Secondary | ICD-10-CM | POA: Diagnosis not present

## 2020-01-09 DIAGNOSIS — R0902 Hypoxemia: Secondary | ICD-10-CM

## 2020-01-09 DIAGNOSIS — Z951 Presence of aortocoronary bypass graft: Secondary | ICD-10-CM

## 2020-01-09 DIAGNOSIS — I2510 Atherosclerotic heart disease of native coronary artery without angina pectoris: Secondary | ICD-10-CM | POA: Diagnosis not present

## 2020-01-09 DIAGNOSIS — E042 Nontoxic multinodular goiter: Secondary | ICD-10-CM

## 2020-01-09 DIAGNOSIS — I214 Non-ST elevation (NSTEMI) myocardial infarction: Secondary | ICD-10-CM | POA: Diagnosis not present

## 2020-01-09 DIAGNOSIS — I361 Nonrheumatic tricuspid (valve) insufficiency: Secondary | ICD-10-CM

## 2020-01-09 DIAGNOSIS — R778 Other specified abnormalities of plasma proteins: Secondary | ICD-10-CM | POA: Diagnosis not present

## 2020-01-09 DIAGNOSIS — I110 Hypertensive heart disease with heart failure: Secondary | ICD-10-CM

## 2020-01-09 DIAGNOSIS — I340 Nonrheumatic mitral (valve) insufficiency: Secondary | ICD-10-CM

## 2020-01-09 DIAGNOSIS — J9601 Acute respiratory failure with hypoxia: Secondary | ICD-10-CM | POA: Diagnosis not present

## 2020-01-09 HISTORY — PX: CORONARY BALLOON ANGIOPLASTY: CATH118233

## 2020-01-09 HISTORY — PX: LEFT HEART CATH AND CORS/GRAFTS ANGIOGRAPHY: CATH118250

## 2020-01-09 HISTORY — DX: Hypoxemia: R09.02

## 2020-01-09 LAB — CREATININE, SERUM
Creatinine, Ser: 1.06 mg/dL — ABNORMAL HIGH (ref 0.44–1.00)
GFR calc Af Amer: >60 mL/min (ref >60)
GFR calc non Af Amer: 52 mL/min — ABNORMAL LOW (ref >60)

## 2020-01-09 LAB — GLUCOSE, CAPILLARY
Glucose-Capillary: 370 mg/dL — ABNORMAL HIGH (ref 70–99)
Glucose-Capillary: 384 mg/dL — ABNORMAL HIGH (ref 70–99)
Glucose-Capillary: 271 mg/dL — ABNORMAL HIGH (ref 70–99)
Glucose-Capillary: 278 mg/dL — ABNORMAL HIGH (ref 70–99)

## 2020-01-09 LAB — CBC
HCT: 42.5 % (ref 36.0–46.0)
HCT: 39.2 % (ref 36.0–46.0)
Hemoglobin: 14.6 g/dL (ref 12.0–15.0)
Hemoglobin: 13.1 g/dL (ref 12.0–15.0)
MCH: 30.9 pg (ref 26.0–34.0)
MCH: 31 pg (ref 26.0–34.0)
MCHC: 34.4 g/dL (ref 30.0–36.0)
MCHC: 33.4 g/dL (ref 30.0–36.0)
MCV: 89.9 fL (ref 80.0–100.0)
MCV: 92.9 fL (ref 80.0–100.0)
Platelets: 195 10*3/uL (ref 150–400)
Platelets: 169 10*3/uL (ref 150–400)
RBC: 4.73 MIL/uL (ref 3.87–5.11)
RBC: 4.22 MIL/uL (ref 3.87–5.11)
RDW: 12 % (ref 11.5–15.5)
RDW: 12.2 % (ref 11.5–15.5)
WBC: 11.2 10*3/uL — ABNORMAL HIGH (ref 4.0–10.5)
WBC: 9 10*3/uL (ref 4.0–10.5)
nRBC: 0 % (ref 0.0–0.2)
nRBC: 0 % (ref 0.0–0.2)

## 2020-01-09 LAB — COMPREHENSIVE METABOLIC PANEL
ALT: 33 U/L (ref 0–44)
AST: 153 U/L — ABNORMAL HIGH (ref 15–41)
Albumin: 2.9 g/dL — ABNORMAL LOW (ref 3.5–5.0)
Alkaline Phosphatase: 50 U/L (ref 38–126)
Anion gap: 15 (ref 5–15)
BUN: 21 mg/dL (ref 8–23)
CO2: 23 mmol/L (ref 22–32)
Calcium: 9.2 mg/dL (ref 8.9–10.3)
Chloride: 99 mmol/L (ref 98–111)
Creatinine, Ser: 1.02 mg/dL — ABNORMAL HIGH (ref 0.44–1.00)
GFR calc Af Amer: >60 mL/min (ref >60)
GFR calc non Af Amer: 55 mL/min — ABNORMAL LOW (ref >60)
Glucose, Bld: 395 mg/dL — ABNORMAL HIGH (ref 70–99)
Potassium: 3.6 mmol/L (ref 3.5–5.1)
Sodium: 137 mmol/L (ref 135–145)
Total Bilirubin: 0.5 mg/dL (ref 0.3–1.2)
Total Protein: 6.2 g/dL — ABNORMAL LOW (ref 6.5–8.1)

## 2020-01-09 LAB — HEMOGLOBIN A1C
Hgb A1c MFr Bld: 12.4 % — ABNORMAL HIGH (ref 4.8–5.6)
Mean Plasma Glucose: 309.18 mg/dL

## 2020-01-09 LAB — POCT ACTIVATED CLOTTING TIME
Activated Clotting Time: 268 seconds
Activated Clotting Time: 252 seconds

## 2020-01-09 LAB — ECHOCARDIOGRAM COMPLETE
Height: 61 in
Weight: 2880 oz

## 2020-01-09 LAB — HEPARIN LEVEL (UNFRACTIONATED)
Heparin Unfractionated: 0.23 IU/mL — ABNORMAL LOW (ref 0.30–0.70)
Heparin Unfractionated: 0.23 IU/mL — ABNORMAL LOW (ref 0.30–0.70)

## 2020-01-09 SURGERY — LEFT HEART CATH AND CORS/GRAFTS ANGIOGRAPHY
Anesthesia: LOCAL

## 2020-01-09 MED ORDER — CLOPIDOGREL BISULFATE 300 MG PO TABS
ORAL_TABLET | ORAL | Status: AC
  Filled 2020-01-09: qty 1

## 2020-01-09 MED ORDER — ENOXAPARIN SODIUM 40 MG/0.4ML ~~LOC~~ SOLN
40.0000 mg | SUBCUTANEOUS | Status: DC
  Administered 2020-01-10 – 2020-01-12 (×3): 40 mg via SUBCUTANEOUS
  Filled 2020-01-09 (×3): qty 0.4

## 2020-01-09 MED ORDER — HEPARIN SODIUM (PORCINE) 1000 UNIT/ML IJ SOLN
INTRAMUSCULAR | Status: DC | PRN
  Administered 2020-01-09: 1500 [IU] via INTRAVENOUS
  Administered 2020-01-09: 4000 [IU] via INTRAVENOUS
  Administered 2020-01-09: 5000 [IU] via INTRAVENOUS

## 2020-01-09 MED ORDER — HEPARIN BOLUS VIA INFUSION
1500.0000 [IU] | Freq: Once | INTRAVENOUS | Status: AC
  Administered 2020-01-09: 1500 [IU] via INTRAVENOUS
  Filled 2020-01-09: qty 1500

## 2020-01-09 MED ORDER — IOHEXOL 350 MG/ML SOLN
INTRAVENOUS | Status: AC
  Filled 2020-01-09: qty 1

## 2020-01-09 MED ORDER — ONDANSETRON HCL 4 MG/2ML IJ SOLN: 4.0000 mg | Freq: Four times a day (QID) | INTRAMUSCULAR | Status: DC | PRN

## 2020-01-09 MED ORDER — SODIUM CHLORIDE 0.9% FLUSH
3.0000 mL | Freq: Two times a day (BID) | INTRAVENOUS | Status: DC
  Administered 2020-01-09 – 2020-01-12 (×2): 3 mL via INTRAVENOUS

## 2020-01-09 MED ORDER — LIDOCAINE HCL (PF) 1 % IJ SOLN
INTRAMUSCULAR | Status: AC
  Filled 2020-01-09: qty 30

## 2020-01-09 MED ORDER — HEPARIN (PORCINE) IN NACL 1000-0.9 UT/500ML-% IV SOLN
INTRAVENOUS | Status: DC | PRN
  Administered 2020-01-09 (×3): 500 mL

## 2020-01-09 MED ORDER — MIDAZOLAM HCL 2 MG/2ML IJ SOLN
INTRAMUSCULAR | Status: DC | PRN
  Administered 2020-01-09: 1 mg via INTRAVENOUS

## 2020-01-09 MED ORDER — SODIUM CHLORIDE 0.9% FLUSH: 3.0000 mL | INTRAVENOUS | Status: DC | PRN

## 2020-01-09 MED ORDER — SODIUM CHLORIDE 0.9 % IV SOLN: INTRAVENOUS | Status: DC

## 2020-01-09 MED ORDER — CLOPIDOGREL BISULFATE 75 MG PO TABS
75.0000 mg | ORAL_TABLET | Freq: Every day | ORAL | Status: DC
  Administered 2020-01-10 – 2020-01-12 (×3): 75 mg via ORAL
  Filled 2020-01-09 (×3): qty 1

## 2020-01-09 MED ORDER — SODIUM CHLORIDE 0.9% FLUSH
3.0000 mL | Freq: Two times a day (BID) | INTRAVENOUS | Status: DC
  Administered 2020-01-09: 3 mL via INTRAVENOUS

## 2020-01-09 MED ORDER — IOHEXOL 350 MG/ML SOLN
INTRAVENOUS | Status: DC | PRN
  Administered 2020-01-09: 200 mL via INTRA_ARTERIAL

## 2020-01-09 MED ORDER — SODIUM CHLORIDE 0.9 % IV SOLN: 250.0000 mL | INTRAVENOUS | Status: DC | PRN

## 2020-01-09 MED ORDER — ACETAMINOPHEN 325 MG PO TABS: 650.0000 mg | ORAL_TABLET | ORAL | Status: DC | PRN

## 2020-01-09 MED ORDER — ASPIRIN 81 MG PO CHEW
81.0000 mg | CHEWABLE_TABLET | Freq: Every day | ORAL | Status: DC
  Administered 2020-01-10 – 2020-01-12 (×3): 81 mg via ORAL
  Filled 2020-01-09 (×3): qty 1

## 2020-01-09 MED ORDER — HEPARIN (PORCINE) IN NACL 1000-0.9 UT/500ML-% IV SOLN
INTRAVENOUS | Status: AC
  Filled 2020-01-09: qty 1000

## 2020-01-09 MED ORDER — SODIUM CHLORIDE 0.9 % IV SOLN: INTRAVENOUS | Status: AC

## 2020-01-09 MED ORDER — LIDOCAINE HCL (PF) 1 % IJ SOLN
INTRAMUSCULAR | Status: DC | PRN
  Administered 2020-01-09: 20 mL
  Administered 2020-01-09: 2 mL

## 2020-01-09 MED ORDER — FENTANYL CITRATE (PF) 100 MCG/2ML IJ SOLN
INTRAMUSCULAR | Status: AC
  Filled 2020-01-09: qty 2

## 2020-01-09 MED ORDER — PRAVASTATIN SODIUM 40 MG PO TABS
40.0000 mg | ORAL_TABLET | Freq: Every day | ORAL | Status: DC
  Administered 2020-01-10: 40 mg via ORAL
  Filled 2020-01-09: qty 1

## 2020-01-09 MED ORDER — VERAPAMIL HCL 2.5 MG/ML IV SOLN
INTRAVENOUS | Status: AC
  Filled 2020-01-09: qty 2

## 2020-01-09 MED ORDER — PERFLUTREN LIPID MICROSPHERE
1.0000 mL | INTRAVENOUS | Status: AC | PRN
  Administered 2020-01-09: 2 mL via INTRAVENOUS
  Filled 2020-01-09: qty 10

## 2020-01-09 MED ORDER — FENTANYL CITRATE (PF) 100 MCG/2ML IJ SOLN
INTRAMUSCULAR | Status: DC | PRN
  Administered 2020-01-09: 25 ug via INTRAVENOUS

## 2020-01-09 MED ORDER — HEPARIN SODIUM (PORCINE) 1000 UNIT/ML IJ SOLN
INTRAMUSCULAR | Status: AC
  Filled 2020-01-09: qty 1

## 2020-01-09 MED ORDER — ASPIRIN 81 MG PO CHEW
81.0000 mg | CHEWABLE_TABLET | Freq: Once | ORAL | Status: AC
  Administered 2020-01-09: 81 mg via ORAL
  Filled 2020-01-09: qty 1

## 2020-01-09 MED ORDER — MIDAZOLAM HCL 2 MG/2ML IJ SOLN
INTRAMUSCULAR | Status: AC
  Filled 2020-01-09: qty 2

## 2020-01-09 MED ORDER — CLOPIDOGREL BISULFATE 300 MG PO TABS
ORAL_TABLET | ORAL | Status: DC | PRN
  Administered 2020-01-09: 600 mg via ORAL

## 2020-01-09 MED ORDER — VERAPAMIL HCL 2.5 MG/ML IV SOLN
INTRAVENOUS | Status: DC | PRN
  Administered 2020-01-09: 10 mL via INTRA_ARTERIAL

## 2020-01-09 MED ORDER — NITROGLYCERIN 0.4 MG SL SUBL: 0.4000 mg | SUBLINGUAL_TABLET | SUBLINGUAL | Status: DC | PRN

## 2020-01-09 SURGICAL SUPPLY — 28 items
BALLN ~~LOC~~ EMERGE MR 3.0X20 (BALLOONS) ×2
BALLOON ~~LOC~~ EMERGE MR 3.0X20 (BALLOONS) IMPLANT
CATH INFINITI 5 FR IM (CATHETERS) ×1 IMPLANT
CATH INFINITI 5 FR JL3.5 (CATHETERS) ×1 IMPLANT
CATH INFINITI 5FR JK (CATHETERS) IMPLANT
CATH INFINITI 5FR JL4 (CATHETERS) ×1 IMPLANT
CATH INFINITI JR4 5F (CATHETERS) ×1 IMPLANT
CATH LAUNCHER 6FR AL1 (CATHETERS) IMPLANT
CATH LAUNCHER 6FR AR1 (CATHETERS) ×1 IMPLANT
CATH OPTITORQUE TIG 4.0 5F (CATHETERS) ×1 IMPLANT
CATH VISTA GUIDE 6FR JR4 (CATHETERS) ×1 IMPLANT
CATH VISTA GUIDE 6FR LCB (CATHETERS) ×1 IMPLANT
CATHETER LAUNCHER 6FR AL1 (CATHETERS) ×2
CLOSURE MYNX CONTROL 6F/7F (Vascular Products) ×1 IMPLANT
DEVICE RAD COMP TR BAND LRG (VASCULAR PRODUCTS) ×1 IMPLANT
GLIDESHEATH SLEND SS 6F .021 (SHEATH) ×1 IMPLANT
GUIDEWIRE INQWIRE 1.5J.035X260 (WIRE) IMPLANT
INQWIRE 1.5J .035X260CM (WIRE) ×2
KIT ENCORE 26 ADVANTAGE (KITS) ×1 IMPLANT
KIT HEART LEFT (KITS) ×2 IMPLANT
KIT MICROPUNCTURE NIT STIFF (SHEATH) ×1 IMPLANT
PACK CARDIAC CATHETERIZATION (CUSTOM PROCEDURE TRAY) ×2 IMPLANT
SHEATH PINNACLE 6F 10CM (SHEATH) ×1 IMPLANT
SHEATH PROBE COVER 6X72 (BAG) ×1 IMPLANT
TRANSDUCER W/STOPCOCK (MISCELLANEOUS) ×2 IMPLANT
TUBING CIL FLEX 10 FLL-RA (TUBING) ×2 IMPLANT
WIRE EMERALD 3MM-J .035X150CM (WIRE) ×1 IMPLANT
WIRE RUNTHROUGH .014X180CM (WIRE) ×1 IMPLANT

## 2020-01-09 NOTE — Progress Notes (Signed)
ANTICOAGULATION CONSULT NOTE - Follow Up Consult  Pharmacy Consult for Heparin Indication: NSTEMI  Allergies  Allergen Reactions  . Brilinta [Ticagrelor] Shortness Of Breath  . Statins Other (See Comments)    Statins make the legs ache  . Tricor [Fenofibrate] Other (See Comments)    Leg pain  . Latex Itching and Other (See Comments)    Affected areas turn red, also-  . Tape Rash    Heart leads break out the skin    Patient Measurements: Height: 5\' 1"  (154.9 cm) Weight: 81.6 kg (180 lb) IBW/kg (Calculated) : 47.8 Heparin Dosing Weight:  66.3 kg  Vital Signs: Temp: 98.6 F (37 C) (04/28 1128) Temp Source: Oral (04/28 1128) BP: 105/55 (04/28 1128) Pulse Rate: 70 (04/28 1128)  Labs: Recent Labs    01/08/20 1241 01/08/20 1241 01/08/20 1253 01/08/20 1500 01/08/20 1912 01/09/20 0045 01/09/20 1201  HGB 16.5*   < > 17.0*  --   --  14.6  --   HCT 48.6*  --  50.0*  --   --  42.5  --   PLT 237  --   --   --   --  195  --   HEPARINUNFRC  --   --   --   --   --  0.23* 0.23*  CREATININE 1.00  --   --   --   --  1.02*  --   CKTOTAL 421*  --   --   --   --   --   --   TROPONINIHS 4,694*  --   --  13,568* >27,000*  --   --    < > = values in this interval not displayed.    Estimated Creatinine Clearance: 47.5 mL/min (A) (by C-G formula based on SCr of 1.02 mg/dL (H)).   Assessment:  Anticoag: Heparin for NSTEMI. Hep level 0.23 unchanged, remains low. CBC WNL  Goal of Therapy:  Heparin level 0.3-0.7 units/ml Monitor platelets by anticoagulation protocol: Yes   Plan:  IV heparin increase to 1100 units/hr Daily HL and CBC F/u Echo result Possible cath this afternoon.   Cabot Cromartie S. Alford Highland, PharmD, BCPS Clinical Staff Pharmacist [email or web address] Alford Highland, [place] 01/09/2020,1:03 PM

## 2020-01-09 NOTE — Progress Notes (Signed)
Patient headed to unscheduled Cardiac Cath Tele Removed Hep drip held IV fluids started.

## 2020-01-09 NOTE — Progress Notes (Signed)
Pt placed on Auto CPAP Min 5-Max 20 with FFM. Advised pt to notify for RT if she needs any further assistance. RT Will continue to monitor

## 2020-01-09 NOTE — H&P (View-Only) (Signed)
Progress Note  Patient Name: Laurie [name] Date of Encounter: 01/09/2020  Primary Cardiologist: Dr. Bettina Gavia  Subjective   Pt is a poor historian. She had scheduled nitro paste overnight. She denies chest pain prior to arrival and while hospitalized. Husband is at bedside and states she has not been complaining of chest pain. She relays that she has been taking SL nitro intermittently, but doesn't know why when asked.  Inpatient Medications    Scheduled Meds:  aspirin  300 mg Rectal Once   insulin aspart  0-5 Units Subcutaneous QHS   insulin aspart  0-9 Units Subcutaneous TID WC   lisinopril  20 mg Oral Daily   metoprolol succinate  50 mg Oral Daily   multivitamin  1 tablet Oral Daily   nitroGLYCERIN  0.5 inch Topical Q6H   Continuous Infusions:  azithromycin     cefTRIAXone (ROCEPHIN)  IV     heparin 950 Units/hr (01/09/20 0211)   PRN Meds: ipratropium-albuterol   Vital Signs    Vitals:   01/08/20 2359 01/09/20 0201 01/09/20 0400 01/09/20 0734  BP: 104/63 104/62 102/68 112/66  Pulse: (!) 112 (!) 108 75 75  Resp: 18 18 20 20   Temp: 98.7 F (37.1 C) 98.7 F (37.1 C) 98.5 F (36.9 C) 98.6 F (37 C)  TempSrc: Oral Oral Oral Oral  SpO2: 94% 96% 94% 95%  Weight:      Height:        Intake/Output Summary (Last 24 hours) at 01/09/2020 0802 Last data filed at 01/09/2020 0416 Gross per 24 hour  Intake --  Output 200 ml  Net -200 ml   Last 3 Weights 01/08/2020 04/13/2019 01/02/2019  Weight (lbs) 180 lb 196 lb 12.8 oz 200 lb 6.4 oz  Weight (kg) 81.647 kg 89.268 kg 90.901 kg      Telemetry    Paced rhythm - Personally Reviewed  ECG    No new tracings - Personally Reviewed  Physical Exam   GEN: No acute distress.   Neck: No JVD -exam difficult Cardiac: RRR, no murmurs, rubs, or gallops.  Respiratory: respirations unlabored, diminished in bases GI: Soft, nontender, non-distended  MS: No edema; No deformity. Neuro:  Nonfocal  Psych: Normal affect    Labs    High Sensitivity Troponin:   Recent Labs  Lab 01/08/20 1241 01/08/20 1500 01/08/20 1912  TROPONINIHS 4,694* 13,568* >27,000*      Chemistry Recent Labs  Lab 01/08/20 1241 01/08/20 1253 01/09/20 0045  NA 138 133* 137  K 5.5* 7.4* 3.6  CL 103  --  99  CO2 18*  --  23  GLUCOSE 467*  --  395*  BUN 17  --  21  CREATININE 1.00  --  1.02*  CALCIUM 9.6  --  9.2  PROT 7.3  --  6.2*  ALBUMIN 3.6  --  2.9*  AST 66*  --  153*  ALT 28  --  33  ALKPHOS 69  --  50  BILITOT 1.5*  --  0.5  GFRNONAA 56*  --  55*  GFRAA >60  --  >60  ANIONGAP 17*  --  15     Hematology Recent Labs  Lab 01/08/20 1241 01/08/20 1253 01/09/20 0045  WBC 12.5*  --  11.2*  RBC 5.36*  --  4.73  HGB 16.5* 17.0* 14.6  HCT 48.6* 50.0* 42.5  MCV 90.7  --  89.9  MCH 30.8  --  30.9  MCHC 34.0  --  34.4  RDW 11.9  --  12.0  PLT 237  --  195    BNP Recent Labs  Lab 01/08/20 1500  BNP 655.6*     DDimer No results for input(s): DDIMER in the last 168 hours.   Radiology    CT HEAD WO CONTRAST  Result Date: 01/08/2020 CLINICAL DATA:  Found on floor this morning. Increasing altered mental status. EXAM: CT HEAD WITHOUT CONTRAST TECHNIQUE: Contiguous axial images were obtained from the base of the skull through the vertex without intravenous contrast. COMPARISON:  Most recent head CT 03/19/2017 FINDINGS: Brain: Generalized cerebral atrophy, stable from prior exam. Moderate periventricular and deep white matter hypodensity consistent with chronic small vessel ischemia, progressed. No intracranial hemorrhage, mass effect, or midline shift. No hydrocephalus. The basilar cisterns are patent. No evidence of territorial infarct or acute ischemia. No extra-axial or intracranial fluid collection. Vascular: Atherosclerosis of skullbase vasculature without hyperdense vessel or abnormal calcification. Skull: No fracture or focal lesion. Sinuses/Orbits: Paranasal sinuses and mastoid air cells are clear. The  visualized orbits are unremarkable. Bilateral cataract resection. Other: None. IMPRESSION: 1. No acute intracranial abnormality. 2. Generalized cerebral atrophy. Moderate chronic small vessel ischemia, progressed from 2018 exam. Electronically Signed   By: Keith Rake M.D.   On: 01/08/2020 18:48   DG Chest Port 1 View  Result Date: 01/08/2020 CLINICAL DATA:  Dyspnea. EXAM: PORTABLE CHEST 1 VIEW COMPARISON:  October 29, 2017. FINDINGS: Stable cardiomediastinal silhouette. Status post coronary bypass graft. Left-sided pacemaker is unchanged in position. No pneumothorax or pleural effusion is noted. Left lung is clear. New large diffuse right lung opacity is noted most consistent with pneumonia. Bony thorax is unremarkable. IMPRESSION: New large diffuse right lung opacity is noted most consistent with pneumonia. Electronically Signed   By: Marijo Conception M.D.   On: 01/08/2020 13:55    Cardiac Studies   Echo pending  Left heart cath 10/28/17:  Severe three vessel CAD.  Prox LAD to Mid LAD lesion is 100% stenosed. LIMA to LAD is patent.  1st Diag-1 lesion is 100% stenosed. SVG to diagonal is patent with 80% stenosis.  A drug-eluting stent was successfully placed using a STENT RESOLUTE ONYX 2.5X12 in the SVG to diagonal.  Post intervention, there is a 0% residual stenosis.  Dist LAD lesion is 75% stenosed past LIMA insertion.  2nd Mrg lesion is 100% stenosed.  Ost 1st Mrg to 1st Mrg lesion is 100% stenosed. SVG to OM is occluded.  Post intervention, there is a 0% residual stenosis.  A drug-eluting stent was successfully placed using a STENT SYNERGY DES 3X20. This appears to be a fresh occlusion and was successfully treated.  Mid RCA to Dist RCA lesion is 100% stenosed. SVG to PDA is occluded,  LIMA and is normal in caliber.  Ost Cx to Dist Cx lesion is 70% stenosed.  The left ventricular ejection fraction is 45-50% by visual estimate.  There is mild left ventricular systolic  dysfunction.  LV end diastolic pressure is moderately elevated.  There is no aortic valve stenosis.   Successful two vessel PCI to the SVG to diagonal and SVG to OM, with DES.  Continue dual antiplatelet therapy for at least one year and possibly clopidogrel indefinitely going forward.  Finish current bag of angiomax.    Possible discharge in AM.  Patient Profile     74 y.o. female with a hx of chronic systolic CHF s/p BiV ICD, HTN, HLD, DM II, atrial fibrillation, CAD s/p CABG 2000 (PCI  in 2019), h/o CVA and OSA who is being seen today for the evaluation of AMS, hypoxic respiratory failure and elevated troponin.   Assessment & Plan    1. NSTEMI  2. CAD s/p CABG (2000) - hs troponin peaked at > 27000 overnight - pt found down with elevated CE and CK - continue ASA and plavix, BB, statin - she has required nitro paste overnight - continue heparin drip    3. Acute on Chronic systolic heart failure - BNP mildly elevated on arrival - CXR with suspected edema - gave 40 mg IV lasix yesterday, weight and I&Os incomplete - echo pending - she does not appear significantly volume up and reported lying flat last night to sleep   4. Acute respiratory failure 5. Altered mental status - found down for unknown time - hypoxia treated with NRB - pt now on on 2L O2   6. PAF - low Afib burden, no AC - paced rhythm on telemetry   7. Hypertension - lisinopril 20 mg, toprol 50 mg   8. Hyperlipidemia - continue statin - will update lipid profile    Will need to discuss with Dr. Claiborne Billings. Given CE overnight. Made her NPO for possible heart cath this afternoon.      For questions or updates, please contact [place] Please consult [email or web address] for contact info under        Signed, Ledora Bottcher, PA  01/09/2020, 8:02 AM     Patient seen and examined. Agree with assessment and plan. Patient denies any chest pain. She was found yesterday on the floor by her husband.  She has significant CAD and is status post CABG surgery in 2000. At last cath SVG to RCA was occluded and she had a patent LIMA to LAD. When stenting of her vein graft to a diagonal and vein graft to her obtuse marginal vessel. Today she is much more alert than yesterday upon presentation. CT has not shown any acute cranial abnormality. Troponins have progressively increased and are now greater than 27,000. ECG shows paced rhythm. I have recommended definitive diagnostic cardiac catheterization and suspect probable graft occlusion with possible arrhythmic etiology contributing to her apparent syncope. I have reviewed the risks, indications, and alternatives to cardiac catheterization, possible angioplasty, and stenting with the patient. Risks include but are not limited to bleeding, infection, vascular injury, stroke, myocardial infection, arrhythmia, kidney injury, radiation-related injury in the case of prolonged fluoroscopy use, emergency cardiac surgery, and death. The patient understands the risks of serious complication is 1-2 in [id number] with diagnostic cardiac cath and 1-2% or less with angioplasty/stenting. Patient and husband wish to proceed with this procedure today. She is currently n.p.o.  Troy Sine, MD, [facility] 01/09/2020 11:58 AM

## 2020-01-09 NOTE — Progress Notes (Signed)
PROGRESS NOTE    Laurie [name]  [id number] DOB: [birth year] DOA: 01/08/2020 PCP: Haywood Pao, MD   Brief Narrative:  Laurie [name] is a 74 y.o. female with medical history significant for CAD, obstructive with four-vessel CABG in 2000, status post AICD placement, chronic systolic heart failure, EF 45% March 2019, unspecified hepatitis, hyperlipidemia, multinodular goiter, obstructive sleep apnea on CPAP since 2018, peripheral neuropathy, TIA, insulin-dependent type 2 diabetes with questionable history of dementia who presents after being found down per report in the ED.  Patient is a very poor historian given her current state with questionable history of dementia.  Majority of history comes from chart review, physician in the ED and family.  Per husband patient has been somewhat altered over the past 3 to 4 days with worsening memory but no formal diagnosis of dementia, she had very poor p.o. intake but otherwise no complaints other than "sciatic hip pain" and that she is reported no nausea vomiting diarrhea constipation headache fevers or chills to the best of his knowledge, again the patient has been a poor historian for him as well over the past few days.  This morning at approximately 3 AM the patient's husband awoke to loud noise and found the patient in the living room pinned against a wall after what appears to have been a fall near the couch, to him it appears that she had struck her head on the wall as she was laying in that direction.  Patient declines any falls recently.  Patient's husband also indicates she has had very poor p.o. intake over the past few days to week again without clear etiology.  Denies any new medications, changes in medications or diet other than decreased intake.  Patient was noted to be hypoxic into the 70s per EMS upon arrival.  Placed on nonrebreather and brought to the ED.  In the ED patient had notable hyperkalemia, elevated troponin, elevated BNP and  minimally elevated CK -received Lasix IV 40x1.  Chest x-ray remarkable for large right-sided opacification, EKG notable for ventricularly paced tachycardia.  Cardiology was consulted by the ED for elevated troponin.  Hospitalist was called to admit patient for further evaluation treatment of other comorbid conditions.   Assessment & Plan:   Active Problems:   Multinodular goiter   Hypertensive heart disease   CAD (coronary artery disease)   Hyperlipidemia   Sleep apnea   Biventricular automatic implantable cardioverter defibrillator in situ   S/P CABG (coronary artery bypass graft)   Chronic systolic CHF (congestive heart failure) ([place])   Type 2 diabetes mellitus with diabetic neuropathy, unspecified (HCC)   Acute respiratory failure with hypoxia (HCC)   Elevated troponin   Acute hypoxic respiratory failure in the setting of sepsis and CAP versus aspiration pneumonia, POA -Patient placed on nonrebreather in route, continue to wean as tolerated SpO2: 93 % O2 Flow Rate (L/min): 2 L/min -Broad-spectrum coverage including ceftriaxone and azithromycin for commune acquired pneumonia versus aspiration pneumonia -Without wheeze, hold steroids given mental status changes as below and patient's advanced age -Continue nebs, supportive care -Speech eval at bedside then advance diet as tolerated  Elevated troponin, likely NSTEMI for supply demand mismatch History of obstructive CAD status post multiple stent placement and CABG Heart failure, systolic, does not appear to be in acute exacerbation status post AICD placement -Cardiology following, appreciate insight and recommendations -Pending possible catheterization this afternoon -Troponin trending upwards - peak >27k, on heparin gtt -BNP minimally elevated, status post Lasix 40x1, appears  euvolemic at this time without JVD or peripheral edema -EKG as below atrial sensed, ventricular paced, tachycardic with wide-complex without overt ST  elevations or depressions -Continue euvolemia, resume home medications and core measures once verified  Acute metabolic encephalopathy, likely multifactorial in setting of above infection, hypoxia, rule out polypharmacy -Unclear etiology, likely in the setting of acute hypoxia and possible CAP versus aspiration pneumonia -Husband declines any recent medication changes -No notable sedating home medications per med rec -CT head negative for any acute findings, chronic cerebral atrophy noted  IDDM2, uncontrolled - Continue sliding scale insulin, hypoglycemic protocol -Continue to uptitrate sliding scale insulin as required, currently n.p.o. for procedure hold aggressive insulin changes until able to resume PO intake Lab Results  Component Value Date   HGBA1C 12.4 (H) 01/09/2020   Hyperkalemia,mild, resolved -5.5 at admission, status post Lasix 40x 1 -Stop home potassium Lab Results  Component Value Date   K 3.6 01/09/2020    Ambulatory dysfunction, likely multifactorial in the setting of above, POA -Continue PT OT, patient states wife walks with a walker at home at baseline  Obstructive sleep apnea on CPAP since 2018 -Resume CPAP as tolerated at night and while sleeping  DVT prophylaxis: Heparin drip Code Status: Full per husband -consistent with previous documentation Family Communication: Husband noted over the phone Disposition Plan: Inpatient, pending clinical course likely require additional 3 to 4 days of IV fluids, antibiotics, oxygen, further evaluation with cardiology and possible need for procedure per their expertise -ultimate disposition yet to be determined, patient from home Consults called: Cardiology Admission status: Inpatient, continues to require IV antibiotics, oxygen, further evaluation with cardiology and possible need for procedure per their expertise  Subjective: Patient continued to require Nitropaste overnight due to self-reported chest pain although  patient is a markedly poor historian, she now does not recall these events despite documentation. Currently denies chest pain, shortness of breath, nausea, diaphoresis but again this is limited due to her mental status  Objective: Vitals:   01/08/20 1922 01/08/20 2359 01/09/20 0201 01/09/20 0400  BP: 137/84 104/63 104/62 102/68  Pulse: (!) 110 (!) 112 (!) 108 75  Resp: 20 18 18 20   Temp: 97.9 F (36.6 C) 98.7 F (37.1 C) 98.7 F (37.1 C) 98.5 F (36.9 C)  TempSrc: Oral Oral Oral Oral  SpO2: 100% 94% 96% 94%  Weight:      Height:        Intake/Output Summary (Last 24 hours) at 01/09/2020 0651 Last data filed at 01/09/2020 0416 Gross per 24 hour  Intake --  Output 200 ml  Net -200 ml   Filed Weights   01/08/20 1446  Weight: 81.6 kg    Examination:  General:  Pleasantly resting in bed, No acute distress.  Alert to person and place only HEENT:  Normocephalic ecchymosis superior-lateral to the right eye.  Sclerae nonicteric, noninjected.  Extraocular movements intact bilaterally. Neck:  Without mass or deformity.  Trachea is midline. Lungs: Diminished breath sounds bibasilarly, right sided rhonchi otherwise without overt wheeze or rales. Heart: Tachycardic regular rhythm.  Without overt murmurs, rubs, or gallops. Abdomen:  Soft, nontender, nondistended.  Without guarding or rebound. Extremities: Without cyanosis, clubbing, edema, or obvious deformity. Vascular:  Dorsalis pedis and posterior tibial pulses palpable bilaterally. Skin:  Warm and dry, no erythema, no ulcerations.  Data Reviewed: I have personally reviewed following labs and imaging studies  CBC: Recent Labs  Lab 01/08/20 1241 01/08/20 1253 01/09/20 0045  WBC 12.5*  --  11.2*  NEUTROABS 11.1*  --   --   HGB 16.5* 17.0* 14.6  HCT 48.6* 50.0* 42.5  MCV 90.7  --  89.9  PLT 237  --  0000000   Basic Metabolic Panel: Recent Labs  Lab 01/08/20 1241 01/08/20 1253 01/09/20 0045  NA 138 133* 137  K 5.5* 7.4* 3.6   CL 103  --  99  CO2 18*  --  23  GLUCOSE 467*  --  395*  BUN 17  --  21  CREATININE 1.00  --  1.02*  CALCIUM 9.6  --  9.2   GFR: Estimated Creatinine Clearance: 47.5 mL/min (A) (by C-G formula based on SCr of 1.02 mg/dL (H)). Liver Function Tests: Recent Labs  Lab 01/08/20 1241 01/09/20 0045  AST 66* 153*  ALT 28 33  ALKPHOS 69 50  BILITOT 1.5* 0.5  PROT 7.3 6.2*  ALBUMIN 3.6 2.9*   No results for input(s): LIPASE, AMYLASE in the last 168 hours. No results for input(s): AMMONIA in the last 168 hours. Coagulation Profile: No results for input(s): INR, PROTIME in the last 168 hours. Cardiac Enzymes: Recent Labs  Lab 01/08/20 1241  CKTOTAL 421*   BNP (last 3 results) No results for input(s): PROBNP in the last 8760 hours. HbA1C: Recent Labs    01/09/20 0050  HGBA1C 12.4*   CBG: Recent Labs  Lab 01/08/20 2122 01/09/20 0618  GLUCAP 433* 370*   Lipid Profile: No results for input(s): CHOL, HDL, LDLCALC, TRIG, CHOLHDL, LDLDIRECT in the last 72 hours. Thyroid Function Tests: No results for input(s): TSH, T4TOTAL, FREET4, T3FREE, THYROIDAB in the last 72 hours. Anemia Panel: No results for input(s): VITAMINB12, FOLATE, FERRITIN, TIBC, IRON, RETICCTPCT in the last 72 hours. Sepsis Labs: No results for input(s): PROCALCITON, LATICACIDVEN in the last 168 hours.  Recent Results (from the past 240 hour(s))  Respiratory Panel by RT PCR (Flu A&B, Covid) - Nasopharyngeal Swab     Status: None   Collection Time: 01/08/20 12:40 PM   Specimen: Nasopharyngeal Swab  Result Value Ref Range Status   SARS Coronavirus 2 by RT PCR NEGATIVE NEGATIVE Final    Comment: (NOTE) SARS-CoV-2 target nucleic acids are NOT DETECTED. The SARS-CoV-2 RNA is generally detectable in upper respiratoy specimens during the acute phase of infection. The lowest concentration of SARS-CoV-2 viral copies this assay can detect is 131 copies/mL. A negative result does not preclude  SARS-Cov-2 infection and should not be used as the sole basis for treatment or other patient management decisions. A negative result may occur with  improper specimen collection/handling, submission of specimen other than nasopharyngeal swab, presence of viral mutation(s) within the areas targeted by this assay, and inadequate number of viral copies (<131 copies/mL). A negative result must be combined with clinical observations, patient history, and epidemiological information. The expected result is Negative. Fact Sheet for Patients:  PinkCheek.be Fact Sheet for Healthcare Providers:  GravelBags.it This test is not yet ap proved or cleared by the [place] FDA and  has been authorized for detection and/or diagnosis of SARS-CoV-2 by FDA under an Emergency Use Authorization (EUA). This EUA will remain  in effect (meaning this test can be used) for the duration of the COVID-19 declaration under Section 564(b)(1) of the Act, 21 U.S.C. section 360bbb-3(b)(1), unless the authorization is terminated or revoked sooner.    Influenza A by PCR NEGATIVE NEGATIVE Final   Influenza B by PCR NEGATIVE NEGATIVE Final    Comment: (NOTE) The Xpert Xpress SARS-CoV-2/FLU/RSV assay is  intended as an aid in  the diagnosis of influenza from Nasopharyngeal swab specimens and  should not be used as a sole basis for treatment. Nasal washings and  aspirates are unacceptable for Xpert Xpress SARS-CoV-2/FLU/RSV  testing. Fact Sheet for Patients: PinkCheek.be Fact Sheet for Healthcare Providers: GravelBags.it This test is not yet approved or cleared by the [place] FDA and  has been authorized for detection and/or diagnosis of SARS-CoV-2 by  FDA under an Emergency Use Authorization (EUA). This EUA will remain  in effect (meaning this test can be used) for the duration of the  Covid-19  declaration under Section 564(b)(1) of the Act, 21  U.S.C. section 360bbb-3(b)(1), unless the authorization is  terminated or revoked. Performed at [facility], [place] [address] [place], [place] [address]     Radiology Studies: CT HEAD WO CONTRAST  Result Date: 01/08/2020 CLINICAL DATA:  Found on floor this morning. Increasing altered mental status. EXAM: CT HEAD WITHOUT CONTRAST TECHNIQUE: Contiguous axial images were obtained from the base of the skull through the vertex without intravenous contrast. COMPARISON:  Most recent head CT 03/19/2017 FINDINGS: Brain: Generalized cerebral atrophy, stable from prior exam. Moderate periventricular and deep white matter hypodensity consistent with chronic small vessel ischemia, progressed. No intracranial hemorrhage, mass effect, or midline shift. No hydrocephalus. The basilar cisterns are patent. No evidence of territorial infarct or acute ischemia. No extra-axial or intracranial fluid collection. Vascular: Atherosclerosis of skullbase vasculature without hyperdense vessel or abnormal calcification. Skull: No fracture or focal lesion. Sinuses/Orbits: Paranasal sinuses and mastoid air cells are clear. The visualized orbits are unremarkable. Bilateral cataract resection. Other: None. IMPRESSION: 1. No acute intracranial abnormality. 2. Generalized cerebral atrophy. Moderate chronic small vessel ischemia, progressed from 2018 exam. Electronically Signed   By: Keith Rake M.D.   On: 01/08/2020 18:48   DG Chest Port 1 View  Result Date: 01/08/2020 CLINICAL DATA:  Dyspnea. EXAM: PORTABLE CHEST 1 VIEW COMPARISON:  October 29, 2017. FINDINGS: Stable cardiomediastinal silhouette. Status post coronary bypass graft. Left-sided pacemaker is unchanged in position. No pneumothorax or pleural effusion is noted. Left lung is clear. New large diffuse right lung opacity is noted most consistent with pneumonia. Bony thorax is unremarkable. IMPRESSION: New large  diffuse right lung opacity is noted most consistent with pneumonia. Electronically Signed   By: Marijo Conception M.D.   On: 01/08/2020 13:55    Scheduled Meds: . aspirin  300 mg Rectal Once  . insulin aspart  0-5 Units Subcutaneous QHS  . insulin aspart  0-9 Units Subcutaneous TID WC  . lisinopril  20 mg Oral Daily  . metoprolol succinate  50 mg Oral Daily  . multivitamin  1 tablet Oral Daily  . nitroGLYCERIN  0.5 inch Topical Q6H   Continuous Infusions: . azithromycin    . cefTRIAXone (ROCEPHIN)  IV    . heparin 950 Units/hr (01/09/20 0211)     LOS: 1 day   Time spent: 12min  Edina Winningham C Kamarri Lovvorn, DO [facility]  If 7PM-7AM, please contact night-coverage [email or web address]  01/09/2020, 6:51 AM

## 2020-01-09 NOTE — Evaluation (Signed)
Physical Therapy Evaluation Patient Details Name: Laurie [name] MRN: [id number] DOB: 08-Jan-1946 Today's Date: 01/09/2020   History of Present Illness  Pt is a 74 y/o female admitted secondary to a fall at home. Pt found to have acute respiratory failure as well as NSTEMI. PMH including but not limited to CAD s/p CABG, CHF, HTN, DM, a-fib and CVA.   Clinical Impression  Pt presented supine in bed with HOB elevated, awake and willing to participate in therapy session. Pt's husband present throughout session as well. Prior to admission, pt reported that she ambulated with her cane when outside of her home and was independent with ADLs. Pt lives with her husband in a single level home with a few steps to enter. At the time of evaluation, pt overall at a min A level for safety and stability. She presented with generalized weakness, balance deficits and cognitive deficits limiting her independence with functional mobility. Pt would continue to benefit from skilled physical therapy services at this time while admitted and after d/c to address the below listed limitations in order to improve overall safety and independence with functional mobility.  Pt on 3L of O2 throughout with SpO2 at 97-100%, but decreased to 89-92% on RA. Pt asymptomatic throughout.     Follow Up Recommendations Home health PT;Supervision/Assistance - 24 hour    Equipment Recommendations  Rolling walker with 5" wheels    Recommendations for Other Services       Precautions / Restrictions Precautions Precautions: Fall Precaution Comments: watch O2 sats Restrictions Weight Bearing Restrictions: No      Mobility  Bed Mobility Overal bed mobility: Needs Assistance Bed Mobility: Supine to Sit     Supine to sit: Min assist     General bed mobility comments: trunk support needed  Transfers Overall transfer level: Needs assistance Equipment used: Rolling walker (2 wheeled) Transfers: Sit to/from Stand Sit to  Stand: Min assist         General transfer comment: min A for stability with transition into standing from EOB x1 and from toilet x1  Ambulation/Gait Ambulation/Gait assistance: Min guard Gait Distance (Feet): 30 Feet Assistive device: Rolling walker (2 wheeled) Gait Pattern/deviations: Step-through pattern;Decreased stride length Gait velocity: decreased   General Gait Details: pt with mild instability with use of RW, requiring close min guard for safety with ambulation in room with use of RW  Stairs            Wheelchair Mobility    Modified Rankin (Stroke Patients Only)       Balance Overall balance assessment: Needs assistance Sitting-balance support: No upper extremity supported Sitting balance-Leahy Scale: Fair     Standing balance support: Bilateral upper extremity supported;During functional activity Standing balance-Leahy Scale: Poor Standing balance comment: reliant on external support                             Pertinent Vitals/Pain Pain Assessment: No/denies pain    Home Living Family/patient expects to be discharged to:: Private residence Living Arrangements: Spouse/significant other Available Help at Discharge: Family;Available 24 hours/day Type of Home: House Home Access: Stairs to enter Entrance Stairs-Rails: (middle) Entrance Stairs-Number of Steps: 3-5 Home Layout: One level Home Equipment: Shower seat - built in;Cane - single point      Prior Function Level of Independence: Independent with assistive device(s)         Comments: uses cane when going out of the house  Hand Dominance   Dominant Hand: Right    Extremity/Trunk Assessment   Upper Extremity Assessment Upper Extremity Assessment: Generalized weakness    Lower Extremity Assessment Lower Extremity Assessment: Generalized weakness       Communication   Communication: No difficulties  Cognition Arousal/Alertness: Awake/alert Behavior During  Therapy: WFL for tasks assessed/performed Overall Cognitive Status: Impaired/Different from baseline Area of Impairment: Memory;Orientation;Safety/judgement;Problem solving;Following commands                 Orientation Level: Disoriented to;Situation   Memory: Decreased short-term memory Following Commands: Follows multi-step commands with increased time Safety/Judgement: Decreased awareness of safety;Decreased awareness of deficits   Problem Solving: Requires verbal cues;Difficulty sequencing;Requires tactile cues General Comments: difficulty sequencing and following multistep commands, based safety cues needed for mobility      General Comments      Exercises     Assessment/Plan    PT Assessment Patient needs continued PT services  PT Problem List Decreased strength;Decreased range of motion;Decreased activity tolerance;Decreased balance;Decreased coordination;Decreased mobility;Decreased cognition;Decreased knowledge of use of DME;Decreased safety awareness;Decreased knowledge of precautions;Cardiopulmonary status limiting activity       PT Treatment Interventions DME instruction;Stair training;Gait training;Therapeutic activities;Functional mobility training;Therapeutic exercise;Balance training;Neuromuscular re-education;Patient/family education;Cognitive remediation    PT Goals (Current goals can be found in the Care Plan section)  Acute Rehab PT Goals Patient Stated Goal: return to independence PT Goal Formulation: With patient/family Time For Goal Achievement: 01/23/20 Potential to Achieve Goals: Good    Frequency Min 3X/week   Barriers to discharge        Co-evaluation PT/OT/SLP Co-Evaluation/Treatment: Yes Reason for Co-Treatment: For patient/therapist safety;To address functional/ADL transfers PT goals addressed during session: Balance;Mobility/safety with mobility;Proper use of DME;Strengthening/ROM         AM-PAC PT "6 Clicks" Mobility  Outcome  Measure Help needed turning from your back to your side while in a flat bed without using bedrails?: None Help needed moving from lying on your back to sitting on the side of a flat bed without using bedrails?: A Little Help needed moving to and from a bed to a chair (including a wheelchair)?: A Little Help needed standing up from a chair using your arms (e.g., wheelchair or bedside chair)?: A Little Help needed to walk in hospital room?: A Little Help needed climbing 3-5 steps with a railing? : A Little 6 Click Score: 19    End of Session Equipment Utilized During Treatment: Gait belt;Oxygen Activity Tolerance: Patient tolerated treatment well;Patient limited by fatigue Patient left: in chair;with call bell/phone within reach;with chair alarm set;with family/visitor present Nurse Communication: Mobility status PT Visit Diagnosis: Other abnormalities of gait and mobility (R26.89);Muscle weakness (generalized) (M62.81)    Time: [id number] PT Time Calculation (min) (ACUTE ONLY): 39 min   Charges:   PT Evaluation $PT Eval Moderate Complexity: 1 Mod          Eduard Clos, PT, DPT  Acute Rehabilitation Services Pager [phone] Office [place] 01/09/2020, 11:04 AM

## 2020-01-09 NOTE — Progress Notes (Signed)
SLP Cancellation Note  Patient Details Name: Laurie [name] MRN: [id number] DOB: Mar 09, 1946   Cancelled treatment:       Reason Eval/Treat Not Completed: Medical issues which prohibited therapy - pt currently NPO for procedure. Per RN, no difficulty swallowing observed prior to being made NPO. Will continue efforts to complete swallow evaluation.  Georgene Kopper B. Quentin Ore, [facility], [place] Speech Language Pathologist Office: [phone] Pager: [phone]  Shonna Chock 01/09/2020, 10:15 AM

## 2020-01-09 NOTE — Progress Notes (Addendum)
Inpatient Diabetes Program Recommendations  AACE/ADA: New Consensus Statement on Inpatient Glycemic Control (2015)  Target Ranges:  Prepandial:   less than 140 mg/dL      Peak postprandial:   less than 180 mg/dL (1-2 hours)      Critically ill patients:  140 - 180 mg/dL   Lab Results  Component Value Date   GLUCAP 370 (H) 01/09/2020   HGBA1C 12.4 (H) 01/09/2020    Review of Glycemic Control Results for BRIT, BOYAN (MRN [id number]) as of 01/09/2020 09:14  Ref. Range 01/08/2020 21:22 01/09/2020 06:18  Glucose-Capillary Latest Ref Range: 70 - 99 mg/dL 433 (H) 370 (H)   Diabetes history: Type 2 DM Outpatient Diabetes medications: Toujeo 600 units QD Current orders for Inpatient glycemic control: Novolog 0-9 units TID, Novolog 0-5 units QHS  Inpatient Diabetes Program Recommendations:    Patient is followed by Dr Loanne Drilling, outpatient endocrinology, with last appointment 03/2019.   Given such high doses of Toujeo outpatient, recommend starting with weight based and titrating as appropriate: -Levemir 24 units QD -Novolog 5 units TID (assuming patient is consuming >50% of meal) -Increasing correction to Novolog 0-20 units TID   Addendum @1400 : Spoke with patient, daughter and husband at bedside. After much conversation, it is unclear how much insulin the patient had been taking or last administered dose. When referred to Dr Cordelia Pen note from last July, 2020, patient and daughter seemed appalled by Toujeo 600 units, thus assuming patient had not had near this amount. No noted scar tissue on abdomen.  Reviewed patient's current A1c of 12.4%. Explained what a A1c is and what it measures. Also reviewed goal A1c with patient, importance of good glucose control @ home, and blood sugar goals. Reviewed patho of DM, need for insulin, role of pancreas, vascular changes and commorbidities.  Patient reports having meter/supplies and that she checks multiple times a day with range of 130's mg/dL.  However, when asked in a different way patient unable to answer and seemed confused. Reviewed that this would be unusual for a post prandial value to be in the 130's with current A1C. Patient's family could not remember seeing patient check blood sugar. Reviewed with daughter and husband how to review history in a meter to determine CBGs checks and frequency. Patient's daughter requested a list of endocrinologists. Will attach to discharge summary. Also, encouraged daughter to make a list of all prescribed medications and dosages that patient is taking to have with her.  Discussed at length the plan for administering insulin moving forward. Husband reports setting medications out every day and that his wife "handles the insulin". Encouraged family to work together in arranging a place in the refrigerator for prefilled syringes. Daughter is planning to count syringes as they are used and husbands plans to set out insulin syringes when scheduled.   Reviewed 70/30 vial and syringe as an option. Encouraged to eat when taking and reviewed sick day rules. Patient and family have no further questions at this time.   Secure chat sent to Dr Avon Gully regarding recommendations above. Currently, NPO so would continue with Levemir recommendation until patient intake improves. Informed regarding social situation and plan that was discussed. Will continue to follow.   Thanks, Bronson Curb, MSN, RNC-OB Diabetes Coordinator [phone] (8a-5p)

## 2020-01-09 NOTE — Progress Notes (Addendum)
Progress Note  Patient Name: Laurie [name] Date of Encounter: 01/09/2020  Primary Cardiologist: Dr. Bettina Gavia  Subjective   Pt is a poor historian. She had scheduled nitro paste overnight. She denies chest pain prior to arrival and while hospitalized. Husband is at bedside and states she has not been complaining of chest pain. She relays that she has been taking SL nitro intermittently, but doesn't know why when asked.  Inpatient Medications    Scheduled Meds:  aspirin  300 mg Rectal Once   insulin aspart  0-5 Units Subcutaneous QHS   insulin aspart  0-9 Units Subcutaneous TID WC   lisinopril  20 mg Oral Daily   metoprolol succinate  50 mg Oral Daily   multivitamin  1 tablet Oral Daily   nitroGLYCERIN  0.5 inch Topical Q6H   Continuous Infusions:  azithromycin     cefTRIAXone (ROCEPHIN)  IV     heparin 950 Units/hr (01/09/20 0211)   PRN Meds: ipratropium-albuterol   Vital Signs    Vitals:   01/08/20 2359 01/09/20 0201 01/09/20 0400 01/09/20 0734  BP: 104/63 104/62 102/68 112/66  Pulse: (!) 112 (!) 108 75 75  Resp: 18 18 20 20   Temp: 98.7 F (37.1 C) 98.7 F (37.1 C) 98.5 F (36.9 C) 98.6 F (37 C)  TempSrc: Oral Oral Oral Oral  SpO2: 94% 96% 94% 95%  Weight:      Height:        Intake/Output Summary (Last 24 hours) at 01/09/2020 0802 Last data filed at 01/09/2020 0416 Gross per 24 hour  Intake --  Output 200 ml  Net -200 ml   Last 3 Weights 01/08/2020 04/13/2019 01/02/2019  Weight (lbs) 180 lb 196 lb 12.8 oz 200 lb 6.4 oz  Weight (kg) 81.647 kg 89.268 kg 90.901 kg      Telemetry    Paced rhythm - Personally Reviewed  ECG    No new tracings - Personally Reviewed  Physical Exam   GEN: No acute distress.   Neck: No JVD -exam difficult Cardiac: RRR, no murmurs, rubs, or gallops.  Respiratory: respirations unlabored, diminished in bases GI: Soft, nontender, non-distended  MS: No edema; No deformity. Neuro:  Nonfocal  Psych: Normal affect    Labs    High Sensitivity Troponin:   Recent Labs  Lab 01/08/20 1241 01/08/20 1500 01/08/20 1912  TROPONINIHS 4,694* 13,568* >27,000*      Chemistry Recent Labs  Lab 01/08/20 1241 01/08/20 1253 01/09/20 0045  NA 138 133* 137  K 5.5* 7.4* 3.6  CL 103  --  99  CO2 18*  --  23  GLUCOSE 467*  --  395*  BUN 17  --  21  CREATININE 1.00  --  1.02*  CALCIUM 9.6  --  9.2  PROT 7.3  --  6.2*  ALBUMIN 3.6  --  2.9*  AST 66*  --  153*  ALT 28  --  33  ALKPHOS 69  --  50  BILITOT 1.5*  --  0.5  GFRNONAA 56*  --  55*  GFRAA >60  --  >60  ANIONGAP 17*  --  15     Hematology Recent Labs  Lab 01/08/20 1241 01/08/20 1253 01/09/20 0045  WBC 12.5*  --  11.2*  RBC 5.36*  --  4.73  HGB 16.5* 17.0* 14.6  HCT 48.6* 50.0* 42.5  MCV 90.7  --  89.9  MCH 30.8  --  30.9  MCHC 34.0  --  34.4  RDW 11.9  --  12.0  PLT 237  --  195    BNP Recent Labs  Lab 01/08/20 1500  BNP 655.6*     DDimer No results for input(s): DDIMER in the last 168 hours.   Radiology    CT HEAD WO CONTRAST  Result Date: 01/08/2020 CLINICAL DATA:  Found on floor this morning. Increasing altered mental status. EXAM: CT HEAD WITHOUT CONTRAST TECHNIQUE: Contiguous axial images were obtained from the base of the skull through the vertex without intravenous contrast. COMPARISON:  Most recent head CT 03/19/2017 FINDINGS: Brain: Generalized cerebral atrophy, stable from prior exam. Moderate periventricular and deep white matter hypodensity consistent with chronic small vessel ischemia, progressed. No intracranial hemorrhage, mass effect, or midline shift. No hydrocephalus. The basilar cisterns are patent. No evidence of territorial infarct or acute ischemia. No extra-axial or intracranial fluid collection. Vascular: Atherosclerosis of skullbase vasculature without hyperdense vessel or abnormal calcification. Skull: No fracture or focal lesion. Sinuses/Orbits: Paranasal sinuses and mastoid air cells are clear. The  visualized orbits are unremarkable. Bilateral cataract resection. Other: None. IMPRESSION: 1. No acute intracranial abnormality. 2. Generalized cerebral atrophy. Moderate chronic small vessel ischemia, progressed from 2018 exam. Electronically Signed   By: Keith Rake M.D.   On: 01/08/2020 18:48   DG Chest Port 1 View  Result Date: 01/08/2020 CLINICAL DATA:  Dyspnea. EXAM: PORTABLE CHEST 1 VIEW COMPARISON:  October 29, 2017. FINDINGS: Stable cardiomediastinal silhouette. Status post coronary bypass graft. Left-sided pacemaker is unchanged in position. No pneumothorax or pleural effusion is noted. Left lung is clear. New large diffuse right lung opacity is noted most consistent with pneumonia. Bony thorax is unremarkable. IMPRESSION: New large diffuse right lung opacity is noted most consistent with pneumonia. Electronically Signed   By: Marijo Conception M.D.   On: 01/08/2020 13:55    Cardiac Studies   Echo pending  Left heart cath 10/28/17:  Severe three vessel CAD.  Prox LAD to Mid LAD lesion is 100% stenosed. LIMA to LAD is patent.  1st Diag-1 lesion is 100% stenosed. SVG to diagonal is patent with 80% stenosis.  A drug-eluting stent was successfully placed using a STENT RESOLUTE ONYX 2.5X12 in the SVG to diagonal.  Post intervention, there is a 0% residual stenosis.  Dist LAD lesion is 75% stenosed past LIMA insertion.  2nd Mrg lesion is 100% stenosed.  Ost 1st Mrg to 1st Mrg lesion is 100% stenosed. SVG to OM is occluded.  Post intervention, there is a 0% residual stenosis.  A drug-eluting stent was successfully placed using a STENT SYNERGY DES 3X20. This appears to be a fresh occlusion and was successfully treated.  Mid RCA to Dist RCA lesion is 100% stenosed. SVG to PDA is occluded,  LIMA and is normal in caliber.  Ost Cx to Dist Cx lesion is 70% stenosed.  The left ventricular ejection fraction is 45-50% by visual estimate.  There is mild left ventricular systolic  dysfunction.  LV end diastolic pressure is moderately elevated.  There is no aortic valve stenosis.   Successful two vessel PCI to the SVG to diagonal and SVG to OM, with DES.  Continue dual antiplatelet therapy for at least one year and possibly clopidogrel indefinitely going forward.  Finish current bag of angiomax.    Possible discharge in AM.  Patient Profile     74 y.o. female with a hx of chronic systolic CHF s/p BiV ICD, HTN, HLD, DM II, atrial fibrillation, CAD s/p CABG 2000 (PCI  in 2019), h/o CVA and OSA who is being seen today for the evaluation of AMS, hypoxic respiratory failure and elevated troponin.   Assessment & Plan    1. NSTEMI  2. CAD s/p CABG (2000) - hs troponin peaked at > 27000 overnight - pt found down with elevated CE and CK - continue ASA and plavix, BB, statin - she has required nitro paste overnight - continue heparin drip    3. Acute on Chronic systolic heart failure - BNP mildly elevated on arrival - CXR with suspected edema - gave 40 mg IV lasix yesterday, weight and I&Os incomplete - echo pending - she does not appear significantly volume up and reported lying flat last night to sleep   4. Acute respiratory failure 5. Altered mental status - found down for unknown time - hypoxia treated with NRB - pt now on on 2L O2   6. PAF - low Afib burden, no AC - paced rhythm on telemetry   7. Hypertension - lisinopril 20 mg, toprol 50 mg   8. Hyperlipidemia - continue statin - will update lipid profile    Will need to discuss with Dr. Claiborne Billings. Given CE overnight. Made her NPO for possible heart cath this afternoon.      For questions or updates, please contact [place] Please consult [email or web address] for contact info under        Signed, Ledora Bottcher, PA  01/09/2020, 8:02 AM     Patient seen and examined. Agree with assessment and plan. Patient denies any chest pain. She was found yesterday on the floor by her husband.  She has significant CAD and is status post CABG surgery in 2000. At last cath SVG to RCA was occluded and she had a patent LIMA to LAD. When stenting of her vein graft to a diagonal and vein graft to her obtuse marginal vessel. Today she is much more alert than yesterday upon presentation. CT has not shown any acute cranial abnormality. Troponins have progressively increased and are now greater than 27,000. ECG shows paced rhythm. I have recommended definitive diagnostic cardiac catheterization and suspect probable graft occlusion with possible arrhythmic etiology contributing to her apparent syncope. I have reviewed the risks, indications, and alternatives to cardiac catheterization, possible angioplasty, and stenting with the patient. Risks include but are not limited to bleeding, infection, vascular injury, stroke, myocardial infection, arrhythmia, kidney injury, radiation-related injury in the case of prolonged fluoroscopy use, emergency cardiac surgery, and death. The patient understands the risks of serious complication is 1-2 in [id number] with diagnostic cardiac cath and 1-2% or less with angioplasty/stenting. Patient and husband wish to proceed with this procedure today. She is currently n.p.o.  Troy Sine, MD, [facility] 01/09/2020 11:58 AM

## 2020-01-09 NOTE — Progress Notes (Signed)
[place] for Heparin Indication: chest pain/ACS  Allergies  Allergen Reactions  . Brilinta [Ticagrelor] Shortness Of Breath  . Statins Other (See Comments)    Statins make the legs ache  . Tricor [Fenofibrate] Other (See Comments)    Leg pain  . Latex Itching and Other (See Comments)    Affected areas turn red, also-  . Tape Rash    Heart leads break out the skin    Patient Measurements: Height: 5\' 1"  (154.9 cm) Weight: 81.6 kg (180 lb) IBW/kg (Calculated) : 47.8 Heparin Dosing Weight: 66 kg   Vital Signs: Temp: 98.7 F (37.1 C) (04/27 2359) Temp Source: Oral (04/27 2359) BP: 104/63 (04/27 2359) Pulse Rate: 112 (04/27 2359)  Labs: Recent Labs    01/08/20 1241 01/08/20 1241 01/08/20 1253 01/08/20 1500 01/08/20 1912 01/09/20 0045  HGB 16.5*   < > 17.0*  --   --  14.6  HCT 48.6*  --  50.0*  --   --  42.5  PLT 237  --   --   --   --  195  HEPARINUNFRC  --   --   --   --   --  0.23*  CREATININE 1.00  --   --   --   --   --   CKTOTAL 421*  --   --   --   --   --   TROPONINIHS 4,694*  --   --  13,568* >27,000*  --    < > = values in this interval not displayed.    Estimated Creatinine Clearance: 48.5 mL/min (by C-G formula based on SCr of 1 mg/dL).   Medical History: Past Medical History:  Diagnosis Date  . AICD (automatic cardioverter/defibrillator) present   . Bell's palsy 01/05/2010  . CAD (coronary artery disease)    Cath  07/21/1999  mild ostial, 80% stenosis proximal LAD, 90% stenosis proximal Diag 1, 95% stenosis proximal OM 1, 80% stenosis proximal RCA, 95% stenosis mid RCA    CABG 07/22/99  LIMA to LAD, SVG to dx, SVG to OM, SVG to PDA  Dr. [name]   Cardiolite 2012 no ischemia EF 67%  Cath 10/23/12 Severe native three-vessel coronary artery disease with occlusion of all 3 native coronary arteries, Patent sap  . Chronic lower back pain   . Chronic systolic CHF (congestive heart failure) ([place]) 10/22/2015  . Cystitis   .  Hepatitis 1970s   "don't know which" (03/17/2017)  . HIstory of Bell's palsy    October 2012   . Hyperlipidemia    Intolerance to several statins   . Hypertensive heart disease without CHF   . Multinodular goiter   . Obesity (BMI 30-39.9)   . OSA on CPAP    "suppose to wear a mask" (03/17/2017)  . Peripheral neuropathy   . Pneumonia 1990s X 1   "think i had walking pneumonia"  . Seasonal allergies   . Stroke ([place]) 03/2017   "light one"; denies residual on 03/17/2017)  . Type II diabetes mellitus (HCC)     Medications:  Medications Prior to Admission  Medication Sig Dispense Refill Last Dose  . aspirin 81 MG tablet Take 1 tablet (81 mg total) by mouth daily. 30 tablet 11 01/07/2020 at Unknown time  . beta carotene w/minerals (OCUVITE) tablet Take 1 tablet by mouth daily.   01/07/2020 at Unknown time  . cetirizine (ZYRTEC) 10 MG tablet Take 1 tablet (10 mg total) by mouth daily.  01/07/2020 at Unknown time  . cholecalciferol (VITAMIN D) 1000 UNITS tablet Take 1,000 Units by mouth daily.    01/07/2020 at Unknown time  . clopidogrel (PLAVIX) 75 MG tablet Take 1 tablet (75 mg total) by mouth daily. 90 tablet 1 01/07/2020 at am  . furosemide (LASIX) 40 MG tablet TAKE 2 TABLETS BY MOUTH EVERY MORNING AND 1 TABLET EVERY EVENING. TAKE 1 ADDITIONAL TABLET FOR WEIGHT GAIN OF 3LBS OR MORE (Patient taking differently: Take 40-80 mg by mouth See admin instructions. TAKE 2 TABLETS BY MOUTH EVERY MORNING AND 1 TABLET EVERY EVENING. TAKE 1 ADDITIONAL TABLET FOR WEIGHT GAIN OF 3LBS OR MORE) 360 tablet 0 01/07/2020 at Unknown time  . ketoconazole (NIZORAL) 2 % cream Apply 1 application topically daily as needed for irritation. TO AFFECTED AREAS OF FACE    unk  . lisinopril (PRINIVIL,ZESTRIL) 20 MG tablet Take 1 tablet (20 mg total) by mouth daily. 90 tablet 1 01/07/2020 at Unknown time  . metoprolol succinate (TOPROL-XL) 50 MG 24 hr tablet Take 1 tablet (50 mg total) by mouth daily. Take with or immediately  following a meal. 90 tablet 3 01/07/2020 at am  . nitroGLYCERIN (NITROSTAT) 0.4 MG SL tablet Place 0.4 mg under the tongue every 5 (five) minutes as needed for chest pain.    unk  . potassium chloride SA (KLOR-CON M20) 20 MEQ tablet Take 1 tablet (20 mEq total) by mouth daily. 180 tablet 2 Past Week at Unknown time  . pravastatin (PRAVACHOL) 40 MG tablet TAKE 1 TABLET BY MOUTH ONCE DAILY 6 IN THE EVENING (Patient taking differently: Take 40 mg by mouth daily. TAKE 1 TABLET BY MOUTH ONCE DAILY 6 IN THE EVENING) 90 tablet 1 01/07/2020 at Unknown time  . Probiotic Product (PROBIOTIC DAILY) CAPS Take 1 capsule by mouth daily.   01/07/2020 at Unknown time  . psyllium (METAMUCIL) 58.6 % powder Take 1 packet by mouth daily as needed (fiber).    Past Week at Unknown time  . vitamin C (ASCORBIC ACID) 500 MG tablet Take 500 mg by mouth every other day.   01/07/2020 at Unknown time  . ACCU-CHEK FASTCLIX LANCETS MISC USE  TO CHECK BLOOD SUGAR FOUR TIMES DAILY (Patient taking differently: 1 each by Other route 3 (three) times daily. ) 408 each 2   . ACCU-CHEK SMARTVIEW test strip USE  TO CHECK BLOOD SUGAR FOUR TIMES DAILY (Patient taking differently: 1 each by Other route 3 (three) times daily. USE  TO CHECK BLOOD SUGAR THREE TIMES DAILY) 400 each 1   . Alcohol Swabs (B-D SINGLE USE SWABS REGULAR) PADS Use 8 daily for testing blood sugar and insulin injections. 800 each 1   . BD VEO INSULIN SYRINGE U/F 31G X 15/64" 1 ML MISC USE 10 TIMES A DAY WITH INSULIN AS DIRECTED 300 each 0   . Blood Glucose Calibration (ACCU-CHEK SMARTVIEW CONTROL) LIQD Use to calibrate blood glucose meter. 3 each 1   . Continuous Blood Gluc Sensor (FREESTYLE LIBRE 14 DAY SENSOR) MISC APPLY EVERY 14 DAYS 2 each 0   . Insulin Glargine, 2 Unit Dial, (TOUJEO MAX SOLOSTAR) 300 UNIT/ML SOPN Inject 600 Units into the skin every morning. (Patient not taking: Reported on 01/08/2020) 20 pen 11 Not Taking at Unknown time  . Insulin Pen Needle (BD PEN NEEDLE  NANO U/F) 32G X 4 MM MISC 1 each by Does not apply route 3 (three) times daily. Use to inject insulin TID; E11.40 100 each 2  Assessment: 70 YOF who presented with altered mental status now with elevated troponin. Pharmacy consulted to start IV heparin for ACS. H/H mildly elevated. Plt wnl. SCr wnl  4/28 AM update:  Heparin level below goal Troponin rising  Goal of Therapy:  Heparin level 0.3-0.7 units/ml Monitor platelets by anticoagulation protocol: Yes   Plan:  -Heparin 1500 units BOLUS -Inc heparin to 950 units/hr -1000 heparin level  Narda Bonds, PharmD, BCPS Clinical Pharmacist Phone: [phone]

## 2020-01-09 NOTE — Evaluation (Addendum)
Occupational Therapy Evaluation Patient Details Name: Laurie [name] MRN: [id number] DOB: 03/30/1946 Today's Date: 01/09/2020    History of Present Illness 74 y.o. female with a hx of chronic systolic CHF s/p BiV ICD, HTN, HLD, DM II, atrial fibrillation, CAD s/p CABG 2000 (PCI in 2019), h/o CVA and OSA who is being seen today for the evaluation of AMS, hypoxic respiratory failure and elevated troponin.   Clinical Impression   PTA pt living with husband, stating she used cane for increased distances at baseline. She does not endorse falls other than one PTA, but does state she "stumbles" a lot. Noted cognitive deficits in safety awareness, problem solving, and command following. Pt completed bed mobility at min A level and sit <> stand with min A and RW. Pt has posterior lean with functional mobility. Pt was incontinent of urine in bed without awareness. She was able to walk to the bathroom with varying min guard- min A with RW to compensate for posterior lean. Pt sat on the toilet to wash legs and peri area with cues for sequencing while OT set up for wash clothes. Min A needed to power up from low toilet. Given current status, recommend HHOT for continued safe progression of BADL in the home environment. Will continue to follow per POC listed below.   Pt on 3L [place] with SpO2 100%, 89-90% on RA.    Follow Up Recommendations  Home health OT;Supervision/Assistance - 24 hour    Equipment Recommendations  3 in 1 bedside commode    Recommendations for Other Services       Precautions / Restrictions Precautions Precautions: Fall Precaution Comments: watch O2 sats Restrictions Weight Bearing Restrictions: No      Mobility Bed Mobility Overal bed mobility: Needs Assistance Bed Mobility: Supine to Sit     Supine to sit: Min assist     General bed mobility comments: increased trunk support  Transfers Overall transfer level: Needs assistance Equipment used: Rolling walker (2  wheeled) Transfers: Sit to/from Stand Sit to Stand: Min assist         General transfer comment: min A to rise and steady with RW, posterior lean    Balance Overall balance assessment: Needs assistance Sitting-balance support: No upper extremity supported Sitting balance-Leahy Scale: Fair     Standing balance support: Bilateral upper extremity supported;During functional activity Standing balance-Leahy Scale: Poor Standing balance comment: reliant on external support                           ADL either performed or assessed with clinical judgement   ADL Overall ADL's : Needs assistance/impaired Eating/Feeding: Set up;Sitting   Grooming: Set up;Standing   Upper Body Bathing: Set up;Sitting   Lower Body Bathing: Set up;Sitting/lateral leans;Sit to/from stand Lower Body Bathing Details (indicate cue type and reason): sitting on toilet to wash backs of legs after incontinence of urine in bed Upper Body Dressing : Set up;Sitting   Lower Body Dressing: Sit to/from stand;Sitting/lateral leans;Minimal assistance Lower Body Dressing Details (indicate cue type and reason): to steady when standing Toilet Transfer: Minimal assistance;RW;Regular Toilet;Grab bars Toilet Transfer Details (indicate cue type and reason): min A to rise and steady with RW from low commode Toileting- Clothing Manipulation and Hygiene: Set up;Sitting/lateral lean Toileting - Clothing Manipulation Details (indicate cue type and reason): anterior peri care on toilet Tub/ Shower Transfer: Minimal assistance;Ambulation;Rolling walker;Shower seat   Functional mobility during ADLs: Min guard;Minimal assistance;Cueing for safety;Rolling walker  General ADL Comments: decreased coordination and safety awareness     Vision Patient Visual Report: No change from baseline       Perception     Praxis      Pertinent Vitals/Pain Pain Assessment: No/denies pain     Hand Dominance Right    Extremity/Trunk Assessment Upper Extremity Assessment Upper Extremity Assessment: Generalized weakness   Lower Extremity Assessment Lower Extremity Assessment: Generalized weakness       Communication Communication Communication: No difficulties   Cognition Arousal/Alertness: Awake/alert Behavior During Therapy: WFL for tasks assessed/performed Overall Cognitive Status: Impaired/Different from baseline Area of Impairment: Memory;Orientation;Safety/judgement;Problem solving;Following commands                 Orientation Level: Disoriented to;Situation   Memory: Decreased short-term memory Following Commands: Follows multi-step commands with increased time Safety/Judgement: Decreased awareness of safety;Decreased awareness of deficits   Problem Solving: Requires verbal cues;Difficulty sequencing;Requires tactile cues General Comments: difficulty sequencing and following multistep commands, based safety cues needed for mobility   General Comments       Exercises     Shoulder Instructions      Home Living Family/patient expects to be discharged to:: Private residence Living Arrangements: Spouse/significant other Available Help at Discharge: Family;Available 24 hours/day Type of Home: House Home Access: Stairs to enter [employer] of Steps: 3-5 Entrance Stairs-Rails: (middle) Home Layout: One level     Bathroom Shower/Tub: Occupational psychologist: Standard     Home Equipment: Shower seat - built in;Cane - single point          Prior Functioning/Environment Level of Independence: Independent with assistive device(s)        Comments: uses cane when going out of the house        OT Problem List: Decreased strength;Decreased knowledge of use of DME or AE;Decreased activity tolerance;Cardiopulmonary status limiting activity;Impaired balance (sitting and/or standing);Decreased cognition      OT Treatment/Interventions: Self-care/ADL  training;Therapeutic exercise;Patient/family education;Balance training;Energy conservation;Therapeutic activities;DME and/or AE instruction    OT Goals(Current goals can be found in the care plan section) Acute Rehab OT Goals Patient Stated Goal: return to independence OT Goal Formulation: With patient Time For Goal Achievement: 01/23/20 Potential to Achieve Goals: Good  OT Frequency: Min 2X/week   Barriers to D/C:            Co-evaluation              AM-PAC OT "6 Clicks" Daily Activity     Outcome Measure Help from another person eating meals?: None Help from another person taking care of personal grooming?: A Little Help from another person toileting, which includes using toliet, bedpan, or urinal?: A Little Help from another person bathing (including washing, rinsing, drying)?: A Little Help from another person to put on and taking off regular upper body clothing?: None Help from another person to put on and taking off regular lower body clothing?: A Little 6 Click Score: 20   End of Session Equipment Utilized During Treatment: Gait belt;Rolling walker Nurse Communication: Mobility status  Activity Tolerance: Patient tolerated treatment well Patient left: in chair;with call bell/phone within reach;with chair alarm set  OT Visit Diagnosis: Unsteadiness on feet (R26.81);Other abnormalities of gait and mobility (R26.89);Muscle weakness (generalized) (M62.81)                Time: [id number] OT Time Calculation (min): 39 min Charges:  OT General Charges $OT Visit: 1 Visit OT Evaluation $OT Eval Moderate Complexity: 1  [place], MSOT, OTR/L [facility] Office Number: [phone] Pager: [phone]  Zenovia Jarred 01/09/2020, 10:41 AM

## 2020-01-09 NOTE — Progress Notes (Signed)
EPIC Encounter for ICM Monitoring  Patient Name: Laurie [name] is a 74 y.o. female Date: 01/09/2020 Primary Care Physican: Haywood Pao, MD Primary Cardiologist:[place] Electrophysiologist:Klein Bi-V Pacing:>99% LastWeight:196lbs   Patient currently hospitalized  CorVueThoracic impedancenormal.  Prescribed:  Furosemide40 mg take 2 tablets (80 mg total) by mouth every morning and 1 tablet (40 mg total) every evening. Take extra tablet as needed for weight gain of 3 lbs or more.   Potassium 20 mEq take 1 tablet daily.  Labs: 01/09/2020 Creatinine 1.02, BUN 21, Potassium 3.6, Sodium 137, GFR 55->60 01/08/2020 Potassium 7.4, Sodium 133  01/08/2020 Creatinine 1.00, BUN 17, Potassium 5.5, Sodium 138, GFR 56->60  A complete set of results can be found in Results Review.  Recommendations:None, currently hospitalized  Follow-up plan: ICM clinic phone appointment on 01/21/2020 to recheck fluid levels. 91 day device clinic remote transmission6/10/2019.   Copy of ICM check sent to [place]  3 month ICM trend: 01/07/2020    1 Year ICM trend:       [name], RN 01/09/2020 9:37 AM

## 2020-01-09 NOTE — Interval H&P Note (Signed)
Cath Lab Visit (complete for each Cath Lab visit)  Clinical Evaluation Leading to the Procedure:   ACS: Yes.    Non-ACS:  n/a    History and Physical Interval Note:  01/09/2020 3:42 PM  Laurie [name]  has presented today for surgery, with the diagnosis of nstemi.  The various methods of treatment have been discussed with the patient and family. After consideration of risks, benefits and other options for treatment, the patient has consented to  Procedure(s): LEFT HEART CATH AND CORS/GRAFTS ANGIOGRAPHY (N/A) as a surgical intervention.  The patient's history has been reviewed, patient examined, no change in status, stable for surgery.  I have reviewed the patient's chart and labs.  Questions were answered to the patient's satisfaction.     Kathlyn [place]

## 2020-01-09 NOTE — Progress Notes (Signed)
  Echocardiogram 2D Echocardiogram has been performed.  Aseem Sessums A Filmore Molyneux 01/09/2020, 11:07 AM

## 2020-01-09 NOTE — Progress Notes (Signed)
   01/08/20 2359  Assess: MEWS Score  Temp 98.7 F (37.1 C)  BP 104/63  Pulse Rate (!) 112  Resp 18  Level of Consciousness Alert  SpO2 94 %  O2 Device Nasal Cannula  O2 Flow Rate (L/min) 2 L/min  Assess: MEWS Score  MEWS Temp 0  MEWS Systolic 0  MEWS Pulse 2  MEWS RR 0  MEWS LOC 0  MEWS Score 2  MEWS Score Color Yellow  Assess: if the MEWS score is Yellow or Red  Were vital signs taken at a resting state? Yes  Focused Assessment Documented focused assessment  Early Detection of Sepsis Score *See Row Information* Medium  MEWS guidelines implemented *See Row Information* No, vital signs rechecked  Take Vital Signs  Increase Vital Sign Frequency  Yellow: Q 2hr X 2 then Q 4hr X 2, if remains yellow, continue Q 4hrs  pt has been on yellow then turn to green, then back to yellow. HR at 100-115. Will monitor.

## 2020-01-10 DIAGNOSIS — I214 Non-ST elevation (NSTEMI) myocardial infarction: Secondary | ICD-10-CM

## 2020-01-10 DIAGNOSIS — J9601 Acute respiratory failure with hypoxia: Secondary | ICD-10-CM | POA: Diagnosis not present

## 2020-01-10 DIAGNOSIS — Z95810 Presence of automatic (implantable) cardiac defibrillator: Secondary | ICD-10-CM | POA: Diagnosis not present

## 2020-01-10 DIAGNOSIS — I25810 Atherosclerosis of coronary artery bypass graft(s) without angina pectoris: Secondary | ICD-10-CM

## 2020-01-10 LAB — POCT I-STAT EG7
Acid-base deficit: 2 mmol/L (ref 0.0–2.0)
Bicarbonate: 21.5 mmol/L (ref 20.0–28.0)
Calcium, Ion: 1.03 mmol/L — ABNORMAL LOW (ref 1.15–1.40)
HCT: 50 % — ABNORMAL HIGH (ref 36.0–46.0)
Hemoglobin: 17 g/dL — ABNORMAL HIGH (ref 12.0–15.0)
O2 Saturation: 93 %
Potassium: 7.4 mmol/L (ref 3.5–5.1)
Sodium: 133 mmol/L — ABNORMAL LOW (ref 135–145)
TCO2: 23 mmol/L (ref 22–32)
pCO2, Ven: 32.6 mmHg — ABNORMAL LOW (ref 44.0–60.0)
pH, Ven: 7.428 (ref 7.250–7.430)
pO2, Ven: 66 mmHg — ABNORMAL HIGH (ref 32.0–45.0)

## 2020-01-10 LAB — BASIC METABOLIC PANEL
Anion gap: 11 (ref 5–15)
BUN: 24 mg/dL — ABNORMAL HIGH (ref 8–23)
CO2: 23 mmol/L (ref 22–32)
Calcium: 9.1 mg/dL (ref 8.9–10.3)
Chloride: 103 mmol/L (ref 98–111)
Creatinine, Ser: 0.84 mg/dL (ref 0.44–1.00)
GFR calc Af Amer: >60 mL/min (ref >60)
GFR calc non Af Amer: >60 mL/min (ref >60)
Glucose, Bld: 264 mg/dL — ABNORMAL HIGH (ref 70–99)
Potassium: 3.4 mmol/L — ABNORMAL LOW (ref 3.5–5.1)
Sodium: 137 mmol/L (ref 135–145)

## 2020-01-10 LAB — GLUCOSE, CAPILLARY
Glucose-Capillary: 317 mg/dL — ABNORMAL HIGH (ref 70–99)
Glucose-Capillary: 294 mg/dL — ABNORMAL HIGH (ref 70–99)
Glucose-Capillary: 263 mg/dL — ABNORMAL HIGH (ref 70–99)
Glucose-Capillary: 273 mg/dL — ABNORMAL HIGH (ref 70–99)

## 2020-01-10 MED ORDER — LOSARTAN POTASSIUM 50 MG PO TABS
50.0000 mg | ORAL_TABLET | Freq: Every day | ORAL | Status: DC
  Administered 2020-01-11: 50 mg via ORAL
  Filled 2020-01-10: qty 1

## 2020-01-10 MED ORDER — FUROSEMIDE 10 MG/ML IJ SOLN
40.0000 mg | Freq: Once | INTRAMUSCULAR | Status: AC
  Administered 2020-01-10: 40 mg via INTRAVENOUS
  Filled 2020-01-10: qty 4

## 2020-01-10 MED ORDER — POTASSIUM CHLORIDE CRYS ER 20 MEQ PO TBCR
30.0000 meq | EXTENDED_RELEASE_TABLET | Freq: Once | ORAL | Status: AC
  Administered 2020-01-10: 30 meq via ORAL
  Filled 2020-01-10: qty 1

## 2020-01-10 MED ORDER — INSULIN GLARGINE 100 UNIT/ML ~~LOC~~ SOLN
10.0000 [IU] | Freq: Every day | SUBCUTANEOUS | Status: DC
  Administered 2020-01-10: 10 [IU] via SUBCUTANEOUS
  Filled 2020-01-10 (×2): qty 0.1

## 2020-01-10 MED ORDER — METOPROLOL SUCCINATE ER 25 MG PO TB24
25.0000 mg | ORAL_TABLET | Freq: Once | ORAL | Status: AC
  Administered 2020-01-10: 25 mg via ORAL
  Filled 2020-01-10: qty 1

## 2020-01-10 MED ORDER — METOPROLOL SUCCINATE ER 50 MG PO TB24
75.0000 mg | ORAL_TABLET | Freq: Every day | ORAL | Status: DC
  Administered 2020-01-11 – 2020-01-12 (×2): 75 mg via ORAL
  Filled 2020-01-10 (×2): qty 1

## 2020-01-10 NOTE — Progress Notes (Signed)
PROGRESS NOTE    Laurie [name]  [id number] DOB: 1946-08-03 DOA: 01/08/2020 PCP: Haywood Pao, MD   Brief Narrative:  Laurie [name] is a 74 y.o. female with medical history significant for CAD, obstructive with four-vessel CABG in 2000, status post AICD placement, chronic systolic heart failure, EF 45% March 2019, unspecified hepatitis, hyperlipidemia, multinodular goiter, obstructive sleep apnea on CPAP since 2018, peripheral neuropathy, TIA, insulin-dependent type 2 diabetes with questionable history of dementia who presents after being found down per report in the ED.  Patient is a very poor historian given her current state with questionable history of dementia.  Majority of history comes from chart review, physician in the ED and family.  Per husband patient has been somewhat altered over the past 3 to 4 days with worsening memory but no formal diagnosis of dementia, she had very poor p.o. intake but otherwise no complaints other than "sciatic hip pain" and that she is reported no nausea vomiting diarrhea constipation headache fevers or chills to the best of his knowledge, again the patient has been a poor historian for him as well over the past few days.  This morning at approximately 3 AM the patient's husband awoke to loud noise and found the patient in the living room pinned against a wall after what appears to have been a fall near the couch, to him it appears that she had struck her head on the wall as she was laying in that direction.  Patient declines any falls recently.  Patient's husband also indicates she has had very poor p.o. intake over the past few days to week again without clear etiology.  Denies any new medications, changes in medications or diet other than decreased intake.  Patient was noted to be hypoxic into the 70s per EMS upon arrival.  Placed on nonrebreather and brought to the ED.  In the ED patient had notable hyperkalemia, elevated troponin, elevated BNP and  minimally elevated CK -received Lasix IV 40x1.  Chest x-ray remarkable for large right-sided opacification, EKG notable for ventricularly paced tachycardia.  Cardiology was consulted by the ED for elevated troponin.  Hospitalist was called to admit patient for further evaluation treatment of other comorbid conditions.   Assessment & Plan:   Active Problems:   Multinodular goiter   Hypertensive heart disease   CAD (coronary artery disease)   Hyperlipidemia   Sleep apnea   NSTEMI (non-ST elevated myocardial infarction) (HCC)   Biventricular automatic implantable cardioverter defibrillator in situ   S/P CABG (coronary artery bypass graft)   Chronic systolic CHF (congestive heart failure) ([place])   Type 2 diabetes mellitus with diabetic neuropathy, unspecified (HCC)   Acute respiratory failure with hypoxia (HCC)   Elevated troponin    Acute hypoxic respiratory failure in the setting of sepsis and CAP versus aspiration pneumonia, POA -Patient placed on nonrebreather in route, continue to wean as tolerated -Broad-spectrum coverage including ceftriaxone and azithromycin for commune acquired pneumonia versus aspiration pneumonia -Without wheeze, hold steroids given mental status changes as below and patient's advanced age -Continue nebs, supportive care -Speech eval at bedside then advance diet as tolerated  Elevated troponin, likely NSTEMI for supply demand mismatch History of obstructive CAD status post multiple stent placement and CABG Heart failure, systolic, does not appear to be in acute exacerbation status post AICD placement -Cardiology following, appreciate insight and recommendations -Status post left heart catheterization 01/09/2020 -chronic diffuse disease status post balloon angioplasty of SVG to OM lesion -Troponin peak >27k -Echo  with 30-35% EF - akinesis of inferior/inferiorlateral/apical sections -Stop lisinopril--> add losartan,continue metoprolol, consider entresto(defer  to cards) -Continue euvolemia, strict I/Os  Acute metabolic encephalopathy, likely multifactorial in setting of above infection, hypoxia, rule out polypharmacy -Unclear etiology, likely in the setting of acute hypoxia and possible CAP versus aspiration pneumonia -Husband declines any recent medication changes -CT head negative for any acute findings, chronic cerebral atrophy noted -Mental status minimally improving over past 24h; questionable underlying dementia(undiagnosed)  IDDM2, uncontrolled - Continue sliding scale insulin, hypoglycemic protocol -Lantus 10 units at bedtime titrate accordingly -Patient home med rec includes 600 units of Toujeo, but husband indicates patient does not take this, unclear home medication regimen Lab Results  Component Value Date   HGBA1C 12.4 (H) 01/09/2020   Hyperkalemia,mild, resolved -Follow with diuresis -May need to resume patient's home potassium, follow morning labs Lab Results  Component Value Date   K 3.4 (L) 01/10/2020    Ambulatory dysfunction, likely multifactorial in the setting of above(multifactorial), POA -Continue PT OT, patient states wife walks with a walker at home at baseline  Obstructive sleep apnea on CPAP since 2018 -Resume CPAP as tolerated at night and while sleeping  DVT prophylaxis: Heparin  Code Status: Full per husband -consistent with previous documentation Family Communication: Husband at bedside Disposition Plan: Inpatient, pending clinical course likely require additional 3 to 4 days of IV antibiotics in the setting of poor p.o. intake, oxygen in the setting of pneumonia, further evaluation with cardiology and possible need for procedure per their expertise -ultimate disposition yet to be determined, patient from home Consults called: Cardiology Admission status: Inpatient, continues to require IV antibiotics, oxygen, further evaluation with cardiology and possible need for procedure per their  expertise  Subjective: No acute issues or events overnight, patient remains poorly oriented, husband at bedside not yet back to baseline, patient pleasant but denies any chest pain, shortness of breath, nausea, vomiting, diarrhea, constipation, headache, fevers, chills but review of systems appears to be somewhat limited given patient's mental status.  Objective: Vitals:   01/10/20 0057 01/10/20 0339 01/10/20 0815 01/10/20 1327  BP: 136/65 132/88 137/69 (!) 106/57  Pulse: 77 70 78 74  Resp: 20 20 20 20   Temp: 98.7 F (37.1 C) 98.6 F (37 C) 99.5 F (37.5 C) 98.2 F (36.8 C)  TempSrc: Oral Oral Oral Oral  SpO2: (!) 89% 91% 94% 100%  Weight:  84.8 kg    Height:        Intake/Output Summary (Last 24 hours) at 01/10/2020 1442 Last data filed at 01/10/2020 0400 Gross per 24 hour  Intake 716.25 ml  Output --  Net 716.25 ml   Filed Weights   01/08/20 1446 01/10/20 0339  Weight: 81.6 kg 84.8 kg    Examination:  General:  Pleasantly resting in bed, No acute distress.  Alert to person and place only HEENT:  Normocephalic ecchymosis superior-lateral to the right eye.  Sclerae nonicteric, noninjected.  Extraocular movements intact bilaterally. Neck:  Without mass or deformity.  Trachea is midline. Lungs: Diminished breath sounds bibasilarly, right sided rhonchi otherwise without overt wheeze or rales. Heart: Tachycardic regular rhythm.  Without overt murmurs, rubs, or gallops. Abdomen:  Soft, nontender, nondistended.  Without guarding or rebound. Extremities: Without cyanosis, clubbing, edema, or obvious deformity. Vascular:  Dorsalis pedis and posterior tibial pulses palpable bilaterally. Skin:  Warm and dry, no erythema, no ulcerations.  Data Reviewed: I have personally reviewed following labs and imaging studies  CBC: Recent Labs  Lab 01/08/20  1241 01/08/20 1253 01/09/20 0045 01/09/20 1844  WBC 12.5*  --  11.2* 9.0  NEUTROABS 11.1*  --   --   --   HGB 16.5* 17.0* 14.6  13.1  HCT 48.6* 50.0* 42.5 39.2  MCV 90.7  --  89.9 92.9  PLT 237  --  195 [id number]   Basic Metabolic Panel: Recent Labs  Lab 01/08/20 1241 01/08/20 1253 01/09/20 0045 01/09/20 1844 01/10/20 0307  NA 138 133* 137  --  137  K 5.5* 7.4* 3.6  --  3.4*  CL 103  --  99  --  103  CO2 18*  --  23  --  23  GLUCOSE 467*  --  395*  --  264*  BUN 17  --  21  --  24*  CREATININE 1.00  --  1.02* 1.06* 0.84  CALCIUM 9.6  --  9.2  --  9.1   GFR: Estimated Creatinine Clearance: 58.9 mL/min (by C-G formula based on SCr of 0.84 mg/dL). Liver Function Tests: Recent Labs  Lab 01/08/20 1241 01/09/20 0045  AST 66* 153*  ALT 28 33  ALKPHOS 69 50  BILITOT 1.5* 0.5  PROT 7.3 6.2*  ALBUMIN 3.6 2.9*   No results for input(s): LIPASE, AMYLASE in the last 168 hours. No results for input(s): AMMONIA in the last 168 hours. Coagulation Profile: No results for input(s): INR, PROTIME in the last 168 hours. Cardiac Enzymes: Recent Labs  Lab 01/08/20 1241  CKTOTAL 421*   BNP (last 3 results) No results for input(s): PROBNP in the last 8760 hours. HbA1C: Recent Labs    01/09/20 0050  HGBA1C 12.4*   CBG: Recent Labs  Lab 01/09/20 1130 01/09/20 1829 01/09/20 2023 01/10/20 0818 01/10/20 1149  GLUCAP 384* 271* 278* 317* 294*   Lipid Profile: No results for input(s): CHOL, HDL, LDLCALC, TRIG, CHOLHDL, LDLDIRECT in the last 72 hours. Thyroid Function Tests: No results for input(s): TSH, T4TOTAL, FREET4, T3FREE, THYROIDAB in the last 72 hours. Anemia Panel: No results for input(s): VITAMINB12, FOLATE, FERRITIN, TIBC, IRON, RETICCTPCT in the last 72 hours. Sepsis Labs: No results for input(s): PROCALCITON, LATICACIDVEN in the last 168 hours.  Recent Results (from the past 240 hour(s))  Respiratory Panel by RT PCR (Flu A&B, Covid) - Nasopharyngeal Swab     Status: None   Collection Time: 01/08/20 12:40 PM   Specimen: Nasopharyngeal Swab  Result Value Ref Range Status   SARS Coronavirus 2  by RT PCR NEGATIVE NEGATIVE Final    Comment: (NOTE) SARS-CoV-2 target nucleic acids are NOT DETECTED. The SARS-CoV-2 RNA is generally detectable in upper respiratoy specimens during the acute phase of infection. The lowest concentration of SARS-CoV-2 viral copies this assay can detect is 131 copies/mL. A negative result does not preclude SARS-Cov-2 infection and should not be used as the sole basis for treatment or other patient management decisions. A negative result may occur with  improper specimen collection/handling, submission of specimen other than nasopharyngeal swab, presence of viral mutation(s) within the areas targeted by this assay, and inadequate number of viral copies (<131 copies/mL). A negative result must be combined with clinical observations, patient history, and epidemiological information. The expected result is Negative. Fact Sheet for Patients:  PinkCheek.be Fact Sheet for Healthcare Providers:  GravelBags.it This test is not yet ap proved or cleared by the [place] FDA and  has been authorized for detection and/or diagnosis of SARS-CoV-2 by FDA under an Emergency Use Authorization (EUA). This EUA will remain  in effect (meaning this test can be used) for the duration of the COVID-19 declaration under Section 564(b)(1) of the Act, 21 U.S.C. section 360bbb-3(b)(1), unless the authorization is terminated or revoked sooner.    Influenza A by PCR NEGATIVE NEGATIVE Final   Influenza B by PCR NEGATIVE NEGATIVE Final    Comment: (NOTE) The Xpert Xpress SARS-CoV-2/FLU/RSV assay is intended as an aid in  the diagnosis of influenza from Nasopharyngeal swab specimens and  should not be used as a sole basis for treatment. Nasal washings and  aspirates are unacceptable for Xpert Xpress SARS-CoV-2/FLU/RSV  testing. Fact Sheet for Patients: PinkCheek.be Fact Sheet for Healthcare  Providers: GravelBags.it This test is not yet approved or cleared by the [place] FDA and  has been authorized for detection and/or diagnosis of SARS-CoV-2 by  FDA under an Emergency Use Authorization (EUA). This EUA will remain  in effect (meaning this test can be used) for the duration of the  Covid-19 declaration under Section 564(b)(1) of the Act, 21  U.S.C. section 360bbb-3(b)(1), unless the authorization is  terminated or revoked. Performed at [facility], [place] [address], [place], [place] [address]     Radiology Studies: CT HEAD WO CONTRAST  Result Date: 01/08/2020 CLINICAL DATA:  Found on floor this morning. Increasing altered mental status. EXAM: CT HEAD WITHOUT CONTRAST TECHNIQUE: Contiguous axial images were obtained from the base of the skull through the vertex without intravenous contrast. COMPARISON:  Most recent head CT 03/19/2017 FINDINGS: Brain: Generalized cerebral atrophy, stable from prior exam. Moderate periventricular and deep white matter hypodensity consistent with chronic small vessel ischemia, progressed. No intracranial hemorrhage, mass effect, or midline shift. No hydrocephalus. The basilar cisterns are patent. No evidence of territorial infarct or acute ischemia. No extra-axial or intracranial fluid collection. Vascular: Atherosclerosis of skullbase vasculature without hyperdense vessel or abnormal calcification. Skull: No fracture or focal lesion. Sinuses/Orbits: Paranasal sinuses and mastoid air cells are clear. The visualized orbits are unremarkable. Bilateral cataract resection. Other: None. IMPRESSION: 1. No acute intracranial abnormality. 2. Generalized cerebral atrophy. Moderate chronic small vessel ischemia, progressed from 2018 exam. Electronically Signed   By: Keith Rake M.D.   On: 01/08/2020 18:48   CARDIAC CATHETERIZATION  Result Date: 01/09/2020  Mid RCA lesion is 100% stenosed.  Ost RCA to Prox RCA lesion is  50% stenosed.  Previously placed Prox Graft stent (unknown type) is widely patent.  Mid Graft lesion is 65% stenosed.  Origin lesion is 100% stenosed.  LIMA and is normal in caliber.  The graft exhibits no disease.  Mid LAD to Dist LAD lesion is 80% stenosed.  Origin to Prox Graft lesion is 95% stenosed.  Post intervention, there is a 10% residual stenosis.  Balloon angioplasty was performed using a BALLOON [place] EMERGE MR 3.0X20.  Dist LM lesion is 80% stenosed.  Ost Cx to Prox Cx lesion is 100% stenosed.  Ost LAD to Prox LAD lesion is 100% stenosed.  1.  Severe underlying three-vessel coronary artery disease with occluded native vessels. 2.  Known chronically occluded SVG to right PDA.  LIMA to LAD is patent.  However, there is chronic diffuse disease in the mid and distal LAD.  SVG to diagonal is patent with no significant in-stent restenosis.  However, there is a borderline 60 to 70% lesion after the stent.  SVG to OM is patent with severe 95% in-stent restenosis proximally which is the likely culprit for myocardial infarction. 3.  Left ventricular angiography was not performed.  EF was 30 to 35% by echo.  Mildly elevated left ventricular end-diastolic pressure at 16 mmHg. 4.  Successful balloon angioplasty of SVG to OM for severe in-stent restenosis. Recommendations: This was a very difficult and prolonged procedure from the left radial artery given inability to engage the SVG to OM.  There is significant tortuosity in the left subclavian artery.  I had to switch to a femoral approach. Avoid left radial catheterization in the future. Continue dual antiplatelet therapy indefinitely if possible. Aggressive treatment of risk factors. The lesion in the SVG to diagonal can likely be monitored for now.  PCI can be considered if she has residual angina.   ECHOCARDIOGRAM COMPLETE  Result Date: 01/09/2020    ECHOCARDIOGRAM REPORT   Patient Name:   Laurie [name] Date of Exam: 01/09/2020 Medical Rec #:   [id number]        Height:       61.0 in Accession #:    [id number]       Weight:       180.0 lb Date of Birth:  1946/02/24        BSA:          1.806 m Patient Age:    36 years         BP:           115/71 mmHg Patient Gender: F                HR:           91 bpm. Exam Location:  Inpatient Procedure: 2D Echo Indications:    Dyspnea 786.09 / R06.00  History:        Patient has prior history of Echocardiogram examinations, most                 recent 11/24/2017. CAD, S/P CABG (coronary artery bypass graft);                 Risk Factors:Diabetes, Sleep Apnea, Hypertension and                 Dyslipidemia. Biventricular automatic implantable cardioverter                 defibrillator.  Sonographer:    Vikki Ports Turrentine Referring Phys: [id number] HAO MENG IMPRESSIONS  1. Left ventricular ejection fraction, by estimation, is 30 to 35%. The left ventricle has moderately decreased function. The left ventricle has no regional wall motion abnormalities. The left ventricular internal cavity size was mildly dilated. Left ventricular diastolic parameters are consistent with Grade II diastolic dysfunction (pseudonormalization). Elevated left ventricular end-diastolic pressure. There is akinesis of the left ventricular, entire inferior wall, inferolateral wall and apical segment.  2. Right ventricular systolic function is normal. The right ventricular size is normal. There is normal pulmonary artery systolic pressure.  3. Left atrial size was mildly dilated.  4. The mitral valve is normal in structure. Mild mitral valve regurgitation. No evidence of mitral stenosis.  5. The aortic valve is tricuspid. Aortic valve regurgitation is not visualized. Mild aortic valve sclerosis is present, with no evidence of aortic valve stenosis.  6. The inferior vena cava is normal in size with greater than 50% respiratory variability, suggesting right atrial pressure of 3 mmHg. FINDINGS  Left Ventricle: Left ventricular ejection fraction, by  estimation, is 30 to 35%. The left ventricle has moderately decreased function. The left ventricle has no regional wall motion abnormalities. The left ventricular internal cavity size was mildly dilated.  There is no left ventricular hypertrophy. Left ventricular diastolic parameters are consistent with Grade II diastolic dysfunction (pseudonormalization). Elevated left ventricular end-diastolic pressure. Right Ventricle: The right ventricular size is normal. No increase in right ventricular wall thickness. Right ventricular systolic function is normal. There is normal pulmonary artery systolic pressure. The tricuspid regurgitant velocity is 2.64 m/s, and  with an assumed right atrial pressure of 3 mmHg, the estimated right ventricular systolic pressure is 123456 mmHg. Left Atrium: Left atrial size was mildly dilated. Right Atrium: Right atrial size was normal in size. Pericardium: There is no evidence of pericardial effusion. Mitral Valve: The mitral valve is normal in structure. There is mild calcification of the anterior mitral valve leaflet(s). Normal mobility of the mitral valve leaflets. Mild mitral annular calcification. Mild mitral valve regurgitation. No evidence of mitral valve stenosis. Tricuspid Valve: The tricuspid valve is normal in structure. Tricuspid valve regurgitation is mild . No evidence of tricuspid stenosis. Aortic Valve: The aortic valve is tricuspid. Aortic valve regurgitation is not visualized. Mild aortic valve sclerosis is present, with no evidence of aortic valve stenosis. Aortic valve mean gradient measures 4.0 mmHg. Aortic valve peak gradient measures 7.2 mmHg. Aortic valve area, by VTI measures 1.93 cm. Pulmonic Valve: The pulmonic valve was normal in structure. Pulmonic valve regurgitation is mild. No evidence of pulmonic stenosis. Aorta: The aortic root is normal in size and structure. Venous: The inferior vena cava is normal in size with greater than 50% respiratory variability,  suggesting right atrial pressure of 3 mmHg. IAS/Shunts: No atrial level shunt detected by color flow Doppler. Additional Comments: A pacer wire is visualized.  LEFT VENTRICLE PLAX 2D LVIDd:         5.40 cm  Diastology LVIDs:         4.52 cm  LV e' lateral:   5.92 cm/s LV PW:         1.00 cm  LV E/e' lateral: 14.4 LV IVS:        1.00 cm  LV e' medial:    5.27 cm/s LVOT diam:     1.80 cm  LV E/e' medial:  16.1 LV SV:         46 LV SV Index:   26 LVOT Area:     2.54 cm  RIGHT VENTRICLE RV S prime:     7.70 cm/s TAPSE (M-mode): 1.3 cm LEFT ATRIUM             Index       RIGHT ATRIUM           Index LA diam:        4.60 cm 2.55 cm/m  RA Area:     15.40 cm LA Vol (A2C):   72.8 ml 40.31 ml/m RA Volume:   39.20 ml  21.70 ml/m LA Vol (A4C):   64.5 ml 35.71 ml/m LA Biplane Vol: 70.0 ml 38.76 ml/m  AORTIC VALVE AV Area (Vmax):    2.01 cm AV Area (Vmean):   1.82 cm AV Area (VTI):     1.93 cm AV Vmax:           134.00 cm/s AV Vmean:          101.000 cm/s AV VTI:            0.240 m AV Peak Grad:      7.2 mmHg AV Mean Grad:      4.0 mmHg LVOT Vmax:         106.00 cm/s  LVOT Vmean:        72.200 cm/s LVOT VTI:          0.182 m LVOT/AV VTI ratio: 0.76  AORTA Ao Root diam: 2.80 cm MITRAL VALVE                TRICUSPID VALVE MV Area (PHT): 3.72 cm     TR Peak grad:   27.9 mmHg MV Decel Time: 204 msec     TR Vmax:        264.00 cm/s MV E velocity: 85.10 cm/s MV A velocity: 112.00 cm/s  SHUNTS MV E/A ratio:  0.76         Systemic VTI:  0.18 m                             Systemic Diam: 1.80 cm Fransico Him MD Electronically signed by Fransico Him MD Signature Date/Time: 01/09/2020/1:26:09 PM    Final     Scheduled Meds: . aspirin  81 mg Oral Daily  . aspirin  300 mg Rectal Once  . clopidogrel  75 mg Oral Q breakfast  . enoxaparin (LOVENOX) injection  40 mg Subcutaneous Q24H  . insulin aspart  0-5 Units Subcutaneous QHS  . insulin aspart  0-9 Units Subcutaneous TID WC  . [START ON 01/11/2020] losartan  50 mg Oral Daily   . [START ON 01/11/2020] metoprolol succinate  75 mg Oral Daily  . multivitamin  1 tablet Oral Daily  . pravastatin  40 mg Oral q1800  . sodium chloride flush  3 mL Intravenous Q12H   Continuous Infusions: . sodium chloride    . azithromycin 250 mg (01/10/20 1030)  . cefTRIAXone (ROCEPHIN)  IV 1 g (01/09/20 1834)     LOS: 2 days   Time spent: 67min  Tin Engram C Keyleen Cerrato, DO [facility]  If 7PM-7AM, please contact night-coverage [email or web address]  01/10/2020, 2:42 PM

## 2020-01-10 NOTE — Care Management (Signed)
01-10-20 Case Manager received consult for Entresto cost. Benefits check submitted for [facility] and Case Manager will follow for cost. Graves-Bigelow, Ocie Cornfield, RN,BSN Case Manager

## 2020-01-10 NOTE — Progress Notes (Addendum)
Progress Note  Patient Name: Laurie [name] Date of Encounter: 01/10/2020  Primary Cardiologist: Dr. Bettina Gavia  Subjective   No acute overnight events. Patient still confused but denies any chest pain. Breathing improving some.  Inpatient Medications    Scheduled Meds: . aspirin  81 mg Oral Daily  . aspirin  300 mg Rectal Once  . clopidogrel  75 mg Oral Q breakfast  . enoxaparin (LOVENOX) injection  40 mg Subcutaneous Q24H  . insulin aspart  0-5 Units Subcutaneous QHS  . insulin aspart  0-9 Units Subcutaneous TID WC  . lisinopril  20 mg Oral Daily  . metoprolol succinate  50 mg Oral Daily  . multivitamin  1 tablet Oral Daily  . pravastatin  40 mg Oral q1800  . sodium chloride flush  3 mL Intravenous Q12H   Continuous Infusions: . sodium chloride    . azithromycin    . cefTRIAXone (ROCEPHIN)  IV 1 g (01/09/20 1834)   PRN Meds: sodium chloride, acetaminophen, ipratropium-albuterol, nitroGLYCERIN, ondansetron (ZOFRAN) IV, sodium chloride flush   Vital Signs    Vitals:   01/09/20 2306 01/10/20 0057 01/10/20 0339 01/10/20 0815  BP:  136/65 132/88 137/69  Pulse: 77 77 70 78  Resp: 16 20 20 20   Temp:  98.7 F (37.1 C) 98.6 F (37 C) 99.5 F (37.5 C)  TempSrc:  Oral Oral Oral  SpO2: 92% (!) 89% 91% 94%  Weight:   84.8 kg   Height:        Intake/Output Summary (Last 24 hours) at 01/10/2020 0831 Last data filed at 01/10/2020 0400 Gross per 24 hour  Intake 716.25 ml  Output --  Net 716.25 ml   Last 3 Weights 01/10/2020 01/08/2020 04/13/2019  Weight (lbs) 186 lb 15.2 oz 180 lb 196 lb 12.8 oz  Weight (kg) 84.8 kg 81.647 kg 89.268 kg      Telemetry    Ventricular paced rhythm with rates in the 60's to 70's. And PVCs. - Personally Reviewed  ECG    Ventricular paced rhythm. - Personally Reviewed  Physical Exam   GEN: No acute distress.   Neck: Supple. No JVD Cardiac: RRR. No murmurs, rubs, or gallops. Left radial cath site soft with no signs of hematoma. Radial  pulses 2+ and equal bilaterally.  Respiratory: No increased work of breathing. Decreased breath sounds bilaterally with crackles in bases. GI: Soft, non-tender, non-distended  MS: No lower extremity edema. No deformity. Skin: Warm and dry. Neuro: Alert but still confused. Oriented to name, birthday, place (hospital, city, and state) but could not tell me the year or Software engineer. No focal deficits. Psych: Normal affect. Responds appropriately.  Labs    High Sensitivity Troponin:   Recent Labs  Lab 01/08/20 1241 01/08/20 1500 01/08/20 1912  TROPONINIHS 4,694* 13,568* >27,000*      Chemistry Recent Labs  Lab 01/08/20 1241 01/08/20 1241 01/08/20 1253 01/09/20 0045 01/09/20 1844 01/10/20 0307  NA 138   < > 133* 137  --  137  K 5.5*   < > 7.4* 3.6  --  3.4*  CL 103  --   --  99  --  103  CO2 18*  --   --  23  --  23  GLUCOSE 467*  --   --  395*  --  264*  BUN 17  --   --  21  --  24*  CREATININE 1.00   < >  --  1.02* 1.06* 0.84  CALCIUM 9.6  --   --  9.2  --  9.1  PROT 7.3  --   --  6.2*  --   --   ALBUMIN 3.6  --   --  2.9*  --   --   AST 66*  --   --  153*  --   --   ALT 28  --   --  33  --   --   ALKPHOS 69  --   --  50  --   --   BILITOT 1.5*  --   --  0.5  --   --   GFRNONAA 56*   < >  --  55* 52* >60  GFRAA >60   < >  --  >60 >60 >60  ANIONGAP 17*  --   --  15  --  11   < > = values in this interval not displayed.     Hematology Recent Labs  Lab 01/08/20 1241 01/08/20 1241 01/08/20 1253 01/09/20 0045 01/09/20 1844  WBC 12.5*  --   --  11.2* 9.0  RBC 5.36*  --   --  4.73 4.22  HGB 16.5*   < > 17.0* 14.6 13.1  HCT 48.6*   < > 50.0* 42.5 39.2  MCV 90.7  --   --  89.9 92.9  MCH 30.8  --   --  30.9 31.0  MCHC 34.0  --   --  34.4 33.4  RDW 11.9  --   --  12.0 12.2  PLT 237  --   --  195 169   < > = values in this interval not displayed.    BNP Recent Labs  Lab 01/08/20 1500  BNP 655.6*     Lipid Panel     Component Value Date/Time   CHOL 169  10/28/2017 1700   TRIG 178 (H) 10/28/2017 1700   HDL 37 (L) 10/28/2017 1700   CHOLHDL 4.6 10/28/2017 1700   VLDL 36 10/28/2017 1700   LDLCALC 96 10/28/2017 1700   LDLDIRECT 155.1 06/05/2012 1340   DDimer No results for input(s): DDIMER in the last 168 hours.   Radiology    CT HEAD WO CONTRAST  Result Date: 01/08/2020 CLINICAL DATA:  Found on floor this morning. Increasing altered mental status. EXAM: CT HEAD WITHOUT CONTRAST TECHNIQUE: Contiguous axial images were obtained from the base of the skull through the vertex without intravenous contrast. COMPARISON:  Most recent head CT 03/19/2017 FINDINGS: Brain: Generalized cerebral atrophy, stable from prior exam. Moderate periventricular and deep white matter hypodensity consistent with chronic small vessel ischemia, progressed. No intracranial hemorrhage, mass effect, or midline shift. No hydrocephalus. The basilar cisterns are patent. No evidence of territorial infarct or acute ischemia. No extra-axial or intracranial fluid collection. Vascular: Atherosclerosis of skullbase vasculature without hyperdense vessel or abnormal calcification. Skull: No fracture or focal lesion. Sinuses/Orbits: Paranasal sinuses and mastoid air cells are clear. The visualized orbits are unremarkable. Bilateral cataract resection. Other: None. IMPRESSION: 1. No acute intracranial abnormality. 2. Generalized cerebral atrophy. Moderate chronic small vessel ischemia, progressed from 2018 exam. Electronically Signed   By: Keith Rake M.D.   On: 01/08/2020 18:48   CARDIAC CATHETERIZATION  Result Date: 01/09/2020  Mid RCA lesion is 100% stenosed.  Ost RCA to Prox RCA lesion is 50% stenosed.  Previously placed Prox Graft stent (unknown type) is widely patent.  Mid Graft lesion is 65% stenosed.  Origin lesion is 100% stenosed.  LIMA and is normal in caliber.  The graft exhibits  no disease.  Mid LAD to Dist LAD lesion is 80% stenosed.  Origin to Prox Graft lesion is  95% stenosed.  Post intervention, there is a 10% residual stenosis.  Balloon angioplasty was performed using a BALLOON [place] EMERGE MR 3.0X20.  Dist LM lesion is 80% stenosed.  Ost Cx to Prox Cx lesion is 100% stenosed.  Ost LAD to Prox LAD lesion is 100% stenosed.  1.  Severe underlying three-vessel coronary artery disease with occluded native vessels. 2.  Known chronically occluded SVG to right PDA.  LIMA to LAD is patent.  However, there is chronic diffuse disease in the mid and distal LAD.  SVG to diagonal is patent with no significant in-stent restenosis.  However, there is a borderline 60 to 70% lesion after the stent.  SVG to OM is patent with severe 95% in-stent restenosis proximally which is the likely culprit for myocardial infarction. 3.  Left ventricular angiography was not performed.  EF was 30 to 35% by echo.  Mildly elevated left ventricular end-diastolic pressure at 16 mmHg. 4.  Successful balloon angioplasty of SVG to OM for severe in-stent restenosis. Recommendations: This was a very difficult and prolonged procedure from the left radial artery given inability to engage the SVG to OM.  There is significant tortuosity in the left subclavian artery.  I had to switch to a femoral approach. Avoid left radial catheterization in the future. Continue dual antiplatelet therapy indefinitely if possible. Aggressive treatment of risk factors. The lesion in the SVG to diagonal can likely be monitored for now.  PCI can be considered if she has residual angina.   DG Chest Port 1 View  Result Date: 01/08/2020 CLINICAL DATA:  Dyspnea. EXAM: PORTABLE CHEST 1 VIEW COMPARISON:  October 29, 2017. FINDINGS: Stable cardiomediastinal silhouette. Status post coronary bypass graft. Left-sided pacemaker is unchanged in position. No pneumothorax or pleural effusion is noted. Left lung is clear. New large diffuse right lung opacity is noted most consistent with pneumonia. Bony thorax is unremarkable. IMPRESSION: New  large diffuse right lung opacity is noted most consistent with pneumonia. Electronically Signed   By: Marijo Conception M.D.   On: 01/08/2020 13:55   ECHOCARDIOGRAM COMPLETE  Result Date: 01/09/2020    ECHOCARDIOGRAM REPORT   Patient Name:   AMELITA WHITEAKER Date of Exam: 01/09/2020 Medical Rec #:  [id number]        Height:       61.0 in Accession #:    [id number]       Weight:       180.0 lb Date of Birth:  24-Oct-1945        BSA:          1.806 m Patient Age:    1 years         BP:           115/71 mmHg Patient Gender: F                HR:           91 bpm. Exam Location:  Inpatient Procedure: 2D Echo Indications:    Dyspnea 786.09 / R06.00  History:        Patient has prior history of Echocardiogram examinations, most                 recent 11/24/2017. CAD, S/P CABG (coronary artery bypass graft);                 Risk Factors:Diabetes, Sleep  Apnea, Hypertension and                 Dyslipidemia. Biventricular automatic implantable cardioverter                 defibrillator.  Sonographer:    Vikki Ports Turrentine Referring Phys: [id number] HAO MENG IMPRESSIONS  1. Left ventricular ejection fraction, by estimation, is 30 to 35%. The left ventricle has moderately decreased function. The left ventricle has no regional wall motion abnormalities. The left ventricular internal cavity size was mildly dilated. Left ventricular diastolic parameters are consistent with Grade II diastolic dysfunction (pseudonormalization). Elevated left ventricular end-diastolic pressure. There is akinesis of the left ventricular, entire inferior wall, inferolateral wall and apical segment.  2. Right ventricular systolic function is normal. The right ventricular size is normal. There is normal pulmonary artery systolic pressure.  3. Left atrial size was mildly dilated.  4. The mitral valve is normal in structure. Mild mitral valve regurgitation. No evidence of mitral stenosis.  5. The aortic valve is tricuspid. Aortic valve regurgitation is not  visualized. Mild aortic valve sclerosis is present, with no evidence of aortic valve stenosis.  6. The inferior vena cava is normal in size with greater than 50% respiratory variability, suggesting right atrial pressure of 3 mmHg. FINDINGS  Left Ventricle: Left ventricular ejection fraction, by estimation, is 30 to 35%. The left ventricle has moderately decreased function. The left ventricle has no regional wall motion abnormalities. The left ventricular internal cavity size was mildly dilated. There is no left ventricular hypertrophy. Left ventricular diastolic parameters are consistent with Grade II diastolic dysfunction (pseudonormalization). Elevated left ventricular end-diastolic pressure. Right Ventricle: The right ventricular size is normal. No increase in right ventricular wall thickness. Right ventricular systolic function is normal. There is normal pulmonary artery systolic pressure. The tricuspid regurgitant velocity is 2.64 m/s, and  with an assumed right atrial pressure of 3 mmHg, the estimated right ventricular systolic pressure is 123456 mmHg. Left Atrium: Left atrial size was mildly dilated. Right Atrium: Right atrial size was normal in size. Pericardium: There is no evidence of pericardial effusion. Mitral Valve: The mitral valve is normal in structure. There is mild calcification of the anterior mitral valve leaflet(s). Normal mobility of the mitral valve leaflets. Mild mitral annular calcification. Mild mitral valve regurgitation. No evidence of mitral valve stenosis. Tricuspid Valve: The tricuspid valve is normal in structure. Tricuspid valve regurgitation is mild . No evidence of tricuspid stenosis. Aortic Valve: The aortic valve is tricuspid. Aortic valve regurgitation is not visualized. Mild aortic valve sclerosis is present, with no evidence of aortic valve stenosis. Aortic valve mean gradient measures 4.0 mmHg. Aortic valve peak gradient measures 7.2 mmHg. Aortic valve area, by VTI measures  1.93 cm. Pulmonic Valve: The pulmonic valve was normal in structure. Pulmonic valve regurgitation is mild. No evidence of pulmonic stenosis. Aorta: The aortic root is normal in size and structure. Venous: The inferior vena cava is normal in size with greater than 50% respiratory variability, suggesting right atrial pressure of 3 mmHg. IAS/Shunts: No atrial level shunt detected by color flow Doppler. Additional Comments: A pacer wire is visualized.  LEFT VENTRICLE PLAX 2D LVIDd:         5.40 cm  Diastology LVIDs:         4.52 cm  LV e' lateral:   5.92 cm/s LV PW:         1.00 cm  LV E/e' lateral: 14.4 LV IVS:  1.00 cm  LV e' medial:    5.27 cm/s LVOT diam:     1.80 cm  LV E/e' medial:  16.1 LV SV:         46 LV SV Index:   26 LVOT Area:     2.54 cm  RIGHT VENTRICLE RV S prime:     7.70 cm/s TAPSE (M-mode): 1.3 cm LEFT ATRIUM             Index       RIGHT ATRIUM           Index LA diam:        4.60 cm 2.55 cm/m  RA Area:     15.40 cm LA Vol (A2C):   72.8 ml 40.31 ml/m RA Volume:   39.20 ml  21.70 ml/m LA Vol (A4C):   64.5 ml 35.71 ml/m LA Biplane Vol: 70.0 ml 38.76 ml/m  AORTIC VALVE AV Area (Vmax):    2.01 cm AV Area (Vmean):   1.82 cm AV Area (VTI):     1.93 cm AV Vmax:           134.00 cm/s AV Vmean:          101.000 cm/s AV VTI:            0.240 m AV Peak Grad:      7.2 mmHg AV Mean Grad:      4.0 mmHg LVOT Vmax:         106.00 cm/s LVOT Vmean:        72.200 cm/s LVOT VTI:          0.182 m LVOT/AV VTI ratio: 0.76  AORTA Ao Root diam: 2.80 cm MITRAL VALVE                TRICUSPID VALVE MV Area (PHT): 3.72 cm     TR Peak grad:   27.9 mmHg MV Decel Time: 204 msec     TR Vmax:        264.00 cm/s MV E velocity: 85.10 cm/s MV A velocity: 112.00 cm/s  SHUNTS MV E/A ratio:  0.76         Systemic VTI:  0.18 m                             Systemic Diam: 1.80 cm Fransico Him MD Electronically signed by Fransico Him MD Signature Date/Time: 01/09/2020/1:26:09 PM    Final     Cardiac Studies   Left Heart  Catheterization 01/09/2020:  Mid RCA lesion is 100% stenosed.  Ost RCA to Prox RCA lesion is 50% stenosed.  Previously placed Prox Graft stent (unknown type) is widely patent.  Mid Graft lesion is 65% stenosed.  Origin lesion is 100% stenosed.  LIMA and is normal in caliber.  The graft exhibits no disease.  Mid LAD to Dist LAD lesion is 80% stenosed.  Origin to Prox Graft lesion is 95% stenosed.  Post intervention, there is a 10% residual stenosis.  Balloon angioplasty was performed using a BALLOON [place] EMERGE MR 3.0X20.  Dist LM lesion is 80% stenosed.  Ost Cx to Prox Cx lesion is 100% stenosed.  Ost LAD to Prox LAD lesion is 100% stenosed.   1.  Severe underlying three-vessel coronary artery disease with occluded native vessels. 2.  Known chronically occluded SVG to right PDA.  LIMA to LAD is patent.  However, there is chronic diffuse disease in the mid and  distal LAD.  SVG to diagonal is patent with no significant in-stent restenosis.  However, there is a borderline 60 to 70% lesion after the stent.  SVG to OM is patent with severe 95% in-stent restenosis proximally which is the likely culprit for myocardial infarction. 3.  Left ventricular angiography was not performed.  EF was 30 to 35% by echo.  Mildly elevated left ventricular end-diastolic pressure at 16 mmHg. 4.  Successful balloon angioplasty of SVG to OM for severe in-stent restenosis.  Recommendations: This was a very difficult and prolonged procedure from the left radial artery given inability to engage the SVG to OM.  There is significant tortuosity in the left subclavian artery.  I had to switch to a femoral approach. Avoid left radial catheterization in the future. Continue dual antiplatelet therapy indefinitely if possible. Aggressive treatment of risk factors. The lesion in the SVG to diagonal can likely be monitored for now.  PCI can be considered if she has residual angina.  Patient Profile     74 y.o.  female with a history of CAD s/p CABG in 2000 with PCI in [id number], chronic systolic CHF s/p BiV ICD, atrial fibrillation not on anticoagulation, prior CVA, obstructive sleep apnea, hypertension, hyperlipidemia, type 2 diabetes mellitus who is being seen for the evaluation of altered mental status, hypoxic respiratory failure, and elevated troponin.   Assessment & Plan    NSTEMI with Known CAD s/p CABG  - Patient found down with elevated troponin and CK.  - High-sensitivity troponin peaked at >27,000.  - Echo yesterday showed LVEF of 30-35% with akinesis of the entire inferior wall, inferolateral wall, and apical segment. - Left heart catheterization yesterday showed known chronically occluded SVG to right PDA. Patent LIMA to LAD; however, there is chronic diffuse disease in the mid and distal LAD. SVG to Diag patent with no significant in-stent restenosis. However, borderline 60-70% stenosis after stent. SVG to OM patent with severe 95% in-stent restenosis proximally which is felt to be culprit lesion. Mildly elevated LVEDP at 16 mmHg. Patient underwent successful balloon angioplasty of SVG to OM lesion.  - No angina this admission. - Continue dual antiplatelet therapy with Aspirin and Plavix.  - Continue beta-blocker and statin.   Acute on Chronic Systolic CHF - BNP elevated at 655.  - Chest x-ray showed large diffuse right lung opacity most consistent with pneumonia.  - Echo showed LVEF of 30-35% with grade 2 diastolic dysfunction.  - Received one dose of IV Lasix 40mg  on 4/27. Net positive 996 mL this admission. Weight up 6 lbs since admission. Renal function stable. - LVEDP mildly elevated on cath yesterday so will give one additional dose of IV Lasix 40mg  today. - Will stop Lisinopril and start Losartan 50mg  daily. If renal function and BP allow, may be able to then transition to [facility]. - Continue Toprol-XL 50mg  daily.  - Continue to monitor daily weight, strict I/O's, and renal function.    Paroxysmal Atrial Fibrillation - Low atrial fibrillation burden so not on anticoagulation at home.  - Paced ventricular rhythm on telemetry.  - Continue beta-blocker as above.  Hypertension - BP has been soft at times but stable. Most recent BP 137/69.  - Will stop Lisinopril and start Losartan as above. - Continue Toprol as above.  Hyperlipidemia - Continue home Pravastatin for now.  - Will recheck lipid panel in the morning. Sounds like she may not have tolerated high-intensity statins in the past.   Type 2 Diabetes Mellitus - Hemoglobin  A1c 12.4.  - Consider adding SGLT2 inhibitor given CHF. - Management per primary team.   For questions or updates, please contact [place] Please consult [email or web address] for contact info under        Signed, Darreld Mclean, PA-C  01/10/2020, 8:31 AM     Patient seen and examined. Agree with assessment and plan.  No chest pain.  Angiograms personally reviewed.  Subtotal in-stent restenosis in the vein graft supplying the circumflex marginal vessel status post successful PTCA.  Medical therapy for concomitant CAD.  Resting pulse in the upper 80s.  Will titrate metoprolol succinate to 75 mg daily.  Recommend adding Zetia to her current statin dose for an additional 20% LDL reduction.  Consider adding Jardiance 10 mg for her diabetes/CHF.  Keep in hospital today.  Ambulate.  Cardiac rehab.  Aim for probable discharge tomorrow.   Troy Sine, MD, [facility] 01/10/2020 10:24 AM

## 2020-01-10 NOTE — Progress Notes (Signed)
Inpatient Diabetes Program Recommendations  AACE/ADA: New Consensus Statement on Inpatient Glycemic Control (2015)  Target Ranges:  Prepandial:   less than 140 mg/dL      Peak postprandial:   less than 180 mg/dL (1-2 hours)      Critically ill patients:  140 - 180 mg/dL   Lab Results  Component Value Date   GLUCAP 294 (H) 01/10/2020   HGBA1C 12.4 (H) 01/09/2020    Review of Glycemic Control  Diabetes history: Type 2 DM Outpatient Diabetes medications: Toujeo 600 units QD Current orders for Inpatient glycemic control: Novolog 0-9 units TID, Novolog 0-5 units QHS  Inpatient Diabetes Program Recommendations:    Patient is followed by Dr Loanne Drilling, outpatient endocrinology, with last appointment 03/2019.   Given such high doses of Toujeo outpatient, recommend starting with weight based and titrating as appropriate: -Levemir 24 units QD -Novolog 5 units TID (assuming patient is consuming >50% of meal) -Increasing correction to Novolog 0-20 units TID   Spoke with pt and husband again today re emphasizing proper and close follow up care at time of d/c. Cautioned pt with taking high doses of insulin (pt reports not even taking that much). DM Coordinator from yesterday spoke to family about placing pt on 70/30 possibly 20-28 units bid at time of d/c.  Spoke with pt and husband to make sure before they leave the hospital what type and how much insulin she should be taking.  Spoke with Alyse Low, RN about same concerns. DM Coordinator unable to receive response from Dr. Avon Gully yesterday regarding plan of care for DM medications inpatient and at home. Pt continues to have above goal glucose trends. Will continue to monitor and make sure pt is safe for d/c.  Thanks,  Tama Headings RN, MSN, BC-ADM Inpatient Diabetes Coordinator Team Pager [phone] (8a-5p)

## 2020-01-10 NOTE — Progress Notes (Signed)
Pt states she does not want to wear CPAP tonight. Pt seems confused, did not know she wore CPAP last night. RN in room, and aware. RT will check back and continue to monitor.

## 2020-01-10 NOTE — Care Management (Signed)
Per  [place] [phone] Co-pay amount for Entresto 24- 26 mg and or 49-'51mg'$ . Bid for a 30 day supply @ a retail pharmacy.  Mail order [facility] for a 90 day supply Entresto 24-mg -'26mg'$ . and or '49mg'$ -'51mg'$  co-pay will be $125.00.[name]   No PA required Deductible not met Tier 3 Retail Pharmacy: [facility].  Ref# [id number]

## 2020-01-10 NOTE — Progress Notes (Signed)
CARDIAC REHAB PHASE I   PRE:  Rate/Rhythm: paced 76  BP:  Supine: 134/52  Sitting:   Standing:    SaO2: 94% 2L  MODE:  Ambulation: 100 ft   POST:  Rate/Rhythm: 94 paced  BP:  Supine: 127/69  Sitting:   Standing:    SaO2: 889% RA hall put back on 2L in room 0935-1045 Pt incontinent of urine in hallway during walk. Pt seemed unaware that she was voiding. Had walked 100 ft on RA with gait belt use, rolling walker and asst x 1. Pt kept going to right side and hitting wall. Encouraged her to stay in middle of hall with little success. Pt oriented x 3 and did not know why she could not keep walker away from wall. To bed and helped NT get pt cleaned and purewick back. Put back on 2L in bed. Gave pt MI booklet, left diabetic, low sodium and heart healthy diets, and discussed CRP 2. Referred to [facility]. Pt needs PT here to help with gait training and strengthening and to make recommendations for equipment and home needs.  Bed alarm on and husband in room.   Graylon Good, RN BSN  01/10/2020 10:37 AM

## 2020-01-10 NOTE — Evaluation (Signed)
Clinical/Bedside Swallow Evaluation Patient Details  Name: Laurie [name] MRN: [id number] Date of Birth: 02/05/1946  Today's Date: 01/10/2020 Time: SLP Start Time (ACUTE ONLY): 29 SLP Stop Time (ACUTE ONLY): 1138 SLP Time Calculation (min) (ACUTE ONLY): 8 min  Past Medical History:  Past Medical History:  Diagnosis Date  . AICD (automatic cardioverter/defibrillator) present   . Bell's palsy 01/05/2010  . CAD (coronary artery disease)    Cath  07/21/1999  mild ostial, 80% stenosis proximal LAD, 90% stenosis proximal Diag 1, 95% stenosis proximal OM 1, 80% stenosis proximal RCA, 95% stenosis mid RCA    CABG 07/22/99  LIMA to LAD, SVG to dx, SVG to OM, SVG to PDA  Dr. [name]   Cardiolite 2012 no ischemia EF 67%  Cath 10/23/12 Severe native three-vessel coronary artery disease with occlusion of all 3 native coronary arteries, Patent sap  . Chronic lower back pain   . Chronic systolic CHF (congestive heart failure) ([place]) 10/22/2015  . Cystitis   . Hepatitis 1970s   "don't know which" (03/17/2017)  . HIstory of Bell's palsy    October 2012   . Hyperlipidemia    Intolerance to several statins   . Hypertensive heart disease without CHF   . Hypoxia 01/09/2020  . Multinodular goiter   . Obesity (BMI 30-39.9)   . OSA on CPAP    "suppose to wear a mask" (03/17/2017)  . Peripheral neuropathy   . Pneumonia 1990s X 1   "think i had walking pneumonia"  . Seasonal allergies   . Stroke ([place]) 03/2017   "light one"; denies residual on 03/17/2017)  . Type II diabetes mellitus ([place])    Past Surgical History:  Past Surgical History:  Procedure Laterality Date  . BI-VENTRICULAR PACEMAKER UPGRADE  11/05/2015   "upgraded my pacemaker"  . BIOPSY THYROID  05/2010   percutaneous  . CARDIAC CATHETERIZATION N/A 10/21/2015   Procedure: Left Heart Cath and Cors/Grafts Angiography;  Surgeon: Belva Crome, MD;  Location: [facility];  Service: Cardiovascular;  Laterality: N/A;  . CARPAL TUNNEL RELEASE  Bilateral   . CATARACT EXTRACTION Right 01/06/2015  . CATARACT EXTRACTION W/ INTRAOCULAR LENS IMPLANT Left 12/16/2014  . CORONARY ARTERY BYPASS GRAFT  2000  . CORONARY BALLOON ANGIOPLASTY N/A 01/09/2020   Procedure: CORONARY BALLOON ANGIOPLASTY;  Surgeon: Wellington Hampshire, MD;  Location: [facility];  Service: Cardiovascular;  Laterality: N/A;  . CORONARY STENT INTERVENTION N/A 10/28/2017   Procedure: CORONARY STENT INTERVENTION;  Surgeon: Jettie Booze, MD;  Location: [place] [facility];  Service: Cardiovascular;  Laterality: N/A;  . CYSTECTOMY Right    "middle finger"  . DENTAL IMPLANTS    . EP IMPLANTABLE DEVICE N/A 11/05/2015   Procedure: BiV Upgrade;  Surgeon: Deboraha Sprang, MD;  Location: [facility];  Service: Cardiovascular;  Laterality: N/A;  . INSERT / REPLACE / REMOVE PACEMAKER    . LEAD REVISION N/A 10/25/2012   Procedure: LEAD REVISION;  Surgeon: Evans Lance, MD;  Location: [facility];  Service: Cardiovascular;  Laterality: N/A;  . LEFT HEART CATH AND CORS/GRAFTS ANGIOGRAPHY N/A 10/28/2017   Procedure: LEFT HEART CATH AND CORS/GRAFTS ANGIOGRAPHY;  Surgeon: Jettie Booze, MD;  Location: [place] [facility];  Service: Cardiovascular;  Laterality: N/A;  . LEFT HEART CATH AND CORS/GRAFTS ANGIOGRAPHY N/A 01/09/2020   Procedure: LEFT HEART CATH AND CORS/GRAFTS ANGIOGRAPHY;  Surgeon: Wellington Hampshire, MD;  Location: [place] [facility];  Service: Cardiovascular;  Laterality: N/A;  . LEFT HEART CATHETERIZATION WITH CORONARY ANGIOGRAM N/A 10/23/2012   Procedure: LEFT HEART CATHETERIZATION WITH CORONARY ANGIOGRAM;  Surgeon: Jacolyn Reedy, MD;  Location: [facility] C[facility] LAB;  Service: Cardiovascular;  Laterality: N/A;  . PERMANENT PACEMAKER INSERTION N/A 10/23/2012   Procedure: PERMANENT PACEMAKER INSERTION;  Surgeon: Deboraha Sprang, MD;  Location: [facility] CATH LAB;  Service: Cardiovascular;  Laterality: N/A;  . PLANTAR FASCIA RELEASE Left    "& clipped a tendon that went thru  bottom of my foot"  . TEMPORARY PACEMAKER INSERTION N/A 10/22/2012   Procedure: TEMPORARY PACEMAKER INSERTION;  Surgeon: Troy Sine, MD;  Location: [facility] CATH LAB;  Service: Cardiovascular;  Laterality: N/A;  . TUBAL LIGATION     HPI:   Per husband patient had a fall near her couch possibly hitting her head (CT head no acute finding). She has been somewhat altered over the past 3 to 4 days with worsening memory but no formal diagnosis of dementia; she has complained of "sciatic hip pain" and had very poor PO intake over the past few days to a week prior to admit.  In the ED patient had notable hyperkalemia, elevated troponin, elevated BNP and minimally elevated CK -received Lasix IV 40x1.  Chest x-ray remarkable for large right-sided opacification, theer is concern in chart for aspiration pna. Found to have Elevated troponin, likely NSTEMI for supply demand mismatch, underwent cardiac cath on 4/28. Pt has a remote hx of pontine CVA in 2018 with mild dysarthria, no report of dysphagia in chart.    Assessment / Plan / Recommendation Clinical Impression  Pt denies any difficulty swallowing, husband at bedside agrees. Pt observed with 3 oz water swallow challenge with no coughing. Pt also consumed regular solids without difficulty. Recommend pt continue current diet (regular thin) with low risk of aspiration. No SLP f/u needed will sign off.       Aspiration Risk       Diet Recommendation Regular;Thin liquid   Liquid Administration via: Cup;Straw Medication Administration: Whole meds with liquid Supervision: Patient able to self feed Compensations: Slow rate;Small sips/bites    Other  Recommendations     Follow up Recommendations        Frequency and Duration            Prognosis        Swallow Study   General HPI:  Per husband patient had a fall near her couch possibly hitting her head (CT head no acute finding). She has been somewhat altered over the past 3 to 4 days with worsening  memory but no formal diagnosis of dementia; she has complained of "sciatic hip pain" and had very poor PO intake over the past few days to a week prior to admit.  In the ED patient had notable hyperkalemia, elevated troponin, elevated BNP and minimally elevated CK -received Lasix IV 40x1.  Chest x-ray remarkable for large right-sided opacification, theer is concern in chart for aspiration pna. Found to have Elevated troponin, likely NSTEMI for supply demand mismatch, underwent cardiac cath on 4/28. Pt has a remote hx of pontine CVA in 2018 with mild dysarthria, no report of dysphagia in chart.  Type of Study: Bedside Swallow Evaluation Previous Swallow Assessment: none. SLE 2018 - mild dysarthria. Diet Prior to this Study: Regular;Thin liquids Temperature Spikes Noted: No Respiratory Status: Nasal cannula History of Recent Intubation: No Behavior/Cognition: Alert;Cooperative;Pleasant mood Oral Cavity Assessment: Within Functional Limits Oral Care Completed by SLP: No Oral Cavity - Dentition:  Adequate natural dentition Vision: Functional for self-feeding Self-Feeding Abilities: Able to feed self Patient Positioning: Upright in bed Baseline Vocal Quality: Normal Volitional Cough: Strong Volitional Swallow: Able to elicit    Oral/Motor/Sensory Function     Ice Chips     Thin Liquid Thin Liquid: Within functional limits Presentation: Straw    Nectar Thick Nectar Thick Liquid: Not tested   Honey Thick Honey Thick Liquid: Not tested   Puree Puree: Within functional limits Presentation: Self Fed   Solid     Solid: Within functional limits Presentation: [place] Yossef Gilkison, MA [place] Pager [phone] Office [phone]  Lynann Beaver 01/10/2020,12:28 PM

## 2020-01-11 LAB — GLUCOSE, CAPILLARY
Glucose-Capillary: 236 mg/dL — ABNORMAL HIGH (ref 70–99)
Glucose-Capillary: 293 mg/dL — ABNORMAL HIGH (ref 70–99)
Glucose-Capillary: 246 mg/dL — ABNORMAL HIGH (ref 70–99)
Glucose-Capillary: 267 mg/dL — ABNORMAL HIGH (ref 70–99)

## 2020-01-11 LAB — CBC
HCT: 40.4 % (ref 36.0–46.0)
Hemoglobin: 13.5 g/dL (ref 12.0–15.0)
MCH: 30.6 pg (ref 26.0–34.0)
MCHC: 33.4 g/dL (ref 30.0–36.0)
MCV: 91.6 fL (ref 80.0–100.0)
Platelets: 170 10*3/uL (ref 150–400)
RBC: 4.41 MIL/uL (ref 3.87–5.11)
RDW: 12.1 % (ref 11.5–15.5)
WBC: 7.5 10*3/uL (ref 4.0–10.5)
nRBC: 0 % (ref 0.0–0.2)

## 2020-01-11 LAB — COMPREHENSIVE METABOLIC PANEL
ALT: 22 U/L (ref 0–44)
AST: 24 U/L (ref 15–41)
Albumin: 2.6 g/dL — ABNORMAL LOW (ref 3.5–5.0)
Alkaline Phosphatase: 49 U/L (ref 38–126)
Anion gap: 10 (ref 5–15)
BUN: 22 mg/dL (ref 8–23)
CO2: 23 mmol/L (ref 22–32)
Calcium: 8.7 mg/dL — ABNORMAL LOW (ref 8.9–10.3)
Chloride: 102 mmol/L (ref 98–111)
Creatinine, Ser: 0.85 mg/dL (ref 0.44–1.00)
GFR calc Af Amer: >60 mL/min (ref >60)
GFR calc non Af Amer: >60 mL/min (ref >60)
Glucose, Bld: 293 mg/dL — ABNORMAL HIGH (ref 70–99)
Potassium: 3.7 mmol/L (ref 3.5–5.1)
Sodium: 135 mmol/L (ref 135–145)
Total Bilirubin: 0.7 mg/dL (ref 0.3–1.2)
Total Protein: 6.1 g/dL — ABNORMAL LOW (ref 6.5–8.1)

## 2020-01-11 LAB — LIPID PANEL
Cholesterol: 168 mg/dL (ref 0–200)
HDL: 38 mg/dL — ABNORMAL LOW (ref >40)
LDL Cholesterol: 100 mg/dL — ABNORMAL HIGH (ref 0–99)
Total CHOL/HDL Ratio: 4.4 RATIO
Triglycerides: 151 mg/dL — ABNORMAL HIGH (ref <150)
VLDL: 30 mg/dL (ref 0–40)

## 2020-01-11 MED ORDER — ROSUVASTATIN CALCIUM 20 MG PO TABS
40.0000 mg | ORAL_TABLET | Freq: Every day | ORAL | Status: DC
  Administered 2020-01-11 – 2020-01-12 (×2): 40 mg via ORAL
  Filled 2020-01-11 (×2): qty 2

## 2020-01-11 MED ORDER — INSULIN GLARGINE 100 UNIT/ML ~~LOC~~ SOLN
20.0000 [IU] | Freq: Every day | SUBCUTANEOUS | Status: DC
  Administered 2020-01-11: 20 [IU] via SUBCUTANEOUS
  Filled 2020-01-11 (×2): qty 0.2

## 2020-01-11 NOTE — Progress Notes (Signed)
SATURATION QUALIFICATIONS: (This note is used to comply with regulatory documentation for home oxygen)  Patient Saturations on Room Air at Rest = 86%  Patient Saturations on Room Air while Ambulating = N/A%  Patient Saturations on 2 Liters of oxygen while at rest = 91%  Please briefly explain why patient needs home oxygen:

## 2020-01-11 NOTE — Progress Notes (Signed)
Occupational Therapy Treatment Patient Details Name: Laurie [name] MRN: [id number] DOB: November 30, 1945 Today's Date: 01/11/2020    History of present illness Pt is a 74 y/o female admitted secondary to a fall at home. Pt found to have acute respiratory failure as well as NSTEMI. PMH including but not limited to CAD s/p CABG, CHF, HTN, DM, a-fib and CVA.   OT comments  Patient supine in bed and agreeable to OT.  Pt on 1.5L supplemental O2 via [place] with SpO2 >94% throughout session.  Requires min guard for transfers and in room mobility using RW, cueing for safety, RW mgmt and hand placement (during transfer).  Patient with highly distracted by lines today, requires cueing for redirection, problem solving and safety.  No losses of balance today, and no posterior lean noted; min guard during grooming tasks at sink.  Will follow acutely.    Follow Up Recommendations  Home health OT;Supervision/Assistance - 24 hour    Equipment Recommendations  3 in 1 bedside commode    Recommendations for Other Services      Precautions / Restrictions Precautions Precautions: Fall Precaution Comments: watch O2 sats Restrictions Weight Bearing Restrictions: No       Mobility Bed Mobility Overal bed mobility: Needs Assistance Bed Mobility: Supine to Sit;Sit to Supine     Supine to sit: Min guard Sit to supine: Supervision   General bed mobility comments: increased time, min guard to transition to EOB for line mgmt and safety  Transfers Overall transfer level: Needs assistance Equipment used: Rolling walker (2 wheeled) Transfers: Sit to/from Stand Sit to Stand: Min guard         General transfer comment: min guard to steady, cueing for hand placement and safety     Balance Overall balance assessment: Needs assistance Sitting-balance support: No upper extremity supported;Feet supported Sitting balance-Leahy Scale: Fair     Standing balance support: Bilateral upper extremity  supported;During functional activity Standing balance-Leahy Scale: Poor Standing balance comment: reliant on external support                           ADL either performed or assessed with clinical judgement   ADL Overall ADL's : Needs assistance/impaired     Grooming: Supervision/safety;Standing;Oral care               Lower Body Dressing: Min guard;Sit to/from stand Lower Body Dressing Details (indicate cue type and reason): able to don socks, min guard sit to stand  Toilet Transfer: Min guard;Ambulation;RW Toilet Transfer Details (indicate cue type and reason): simulated in room         Functional mobility during ADLs: Min guard;Rolling walker       Vision       Perception     Praxis      Cognition Arousal/Alertness: Awake/alert Behavior During Therapy: WFL for tasks assessed/performed Overall Cognitive Status: Impaired/Different from baseline Area of Impairment: Memory;Safety/judgement;Problem solving;Following commands;Attention;Awareness                   Current Attention Level: Sustained Memory: Decreased short-term memory Following Commands: Follows multi-step commands with increased time Safety/Judgement: Decreased awareness of deficits;Decreased awareness of safety Awareness: Emergent Problem Solving: Requires verbal cues;Slow processing General Comments: patient requires increased time to process and follow multiple step commands, decreased awareness of safety and deficits; decreased STM         Exercises     Shoulder Instructions       General  Comments      Pertinent Vitals/ Pain       Pain Assessment: No/denies pain  Home Living                                          Prior Functioning/Environment              Frequency  Min 2X/week        Progress Toward Goals  OT Goals(current goals can now be found in the care plan section)  Progress towards OT goals: Progressing toward  goals  Acute Rehab OT Goals Patient Stated Goal: return to independence OT Goal Formulation: With patient  Plan Discharge plan remains appropriate;Frequency remains appropriate    Co-evaluation                 AM-PAC OT "6 Clicks" Daily Activity     Outcome Measure   Help from another person eating meals?: None Help from another person taking care of personal grooming?: A Little Help from another person toileting, which includes using toliet, bedpan, or urinal?: A Little Help from another person bathing (including washing, rinsing, drying)?: A Little Help from another person to put on and taking off regular upper body clothing?: None Help from another person to put on and taking off regular lower body clothing?: A Little 6 Click Score: 20    End of Session Equipment Utilized During Treatment: Rolling walker;Gait belt  OT Visit Diagnosis: Unsteadiness on feet (R26.81);Other abnormalities of gait and mobility (R26.89);Muscle weakness (generalized) (M62.81)   Activity Tolerance Patient tolerated treatment well   Patient Left with call bell/phone within reach;in bed;with bed alarm set;with family/visitor present   Nurse Communication Mobility status        Time: [id number] OT Time Calculation (min): 25 min  Charges: OT General Charges $OT Visit: 1 Visit OT Treatments $Self Care/Home Management : 23-37 mins  [place] Pager [phone] Office [place] 01/11/2020, 12:47 PM

## 2020-01-11 NOTE — Progress Notes (Signed)
Progress Note  Patient Name: Laurie [name] Date of Encounter: 01/11/2020  Primary Cardiologist: Dr. Bettina Gavia  Subjective   No chest pain; feels well  Inpatient Medications    Scheduled Meds: . aspirin  81 mg Oral Daily  . aspirin  300 mg Rectal Once  . clopidogrel  75 mg Oral Q breakfast  . enoxaparin (LOVENOX) injection  40 mg Subcutaneous Q24H  . insulin aspart  0-5 Units Subcutaneous QHS  . insulin aspart  0-9 Units Subcutaneous TID WC  . insulin glargine  10 Units Subcutaneous QHS  . losartan  50 mg Oral Daily  . metoprolol succinate  75 mg Oral Daily  . multivitamin  1 tablet Oral Daily  . pravastatin  40 mg Oral q1800  . sodium chloride flush  3 mL Intravenous Q12H   Continuous Infusions: . sodium chloride    . azithromycin 250 mg (01/11/20 0916)  . cefTRIAXone (ROCEPHIN)  IV 1 g (01/10/20 1845)   PRN Meds: sodium chloride, acetaminophen, ipratropium-albuterol, nitroGLYCERIN, ondansetron (ZOFRAN) IV, sodium chloride flush   Vital Signs    Vitals:   01/10/20 1327 01/10/20 2200 01/11/20 0420 01/11/20 0908  BP: (!) 106/57 131/68 121/70 (!) 116/52  Pulse: 74 78 66 79  Resp: 20 20 (!) 21   Temp: 98.2 F (36.8 C) 99.8 F (37.7 C) 98.2 F (36.8 C)   TempSrc: Oral Oral Oral   SpO2: 100% 93% 98%   Weight:   84.4 kg   Height:        Intake/Output Summary (Last 24 hours) at 01/11/2020 1421 Last data filed at 01/11/2020 0245 Gross per 24 hour  Intake --  Output 600 ml  Net -600 ml   Last 3 Weights 01/11/2020 01/10/2020 01/08/2020  Weight (lbs) 186 lb 1.1 oz 186 lb 15.2 oz 180 lb  Weight (kg) 84.4 kg 84.8 kg 81.647 kg      Telemetry    Ventricular paced rhythm with rates in the 60's to 70's. And PVCs. - Personally Reviewed  ECG    Ventricular paced rhythm. - Personally Reviewed  Physical Exam   GEN: No acute distress.   Neck: Supple. No JVD Cardiac: RRR. No murmurs, rubs, or gallops. Left radial cath site soft with no signs of hematoma. Radial  pulses 2+ and equal bilaterally.  Respiratory: No increased work of breathing. Decreased breath sounds bilaterally with crackles in bases. GI: Soft, non-tender, non-distended  MS: No lower extremity edema. No deformity. Skin: Warm and dry. Neuro: Alert but still confused. Oriented to name, birthday, place (hospital, city, and state) but could not tell me the year or Software engineer. No focal deficits. Psych: Normal affect. Responds appropriately.  Labs    High Sensitivity Troponin:   Recent Labs  Lab 01/08/20 1241 01/08/20 1500 01/08/20 1912  TROPONINIHS 4,694* 13,568* >27,000*      Chemistry Recent Labs  Lab 01/08/20 1241 01/08/20 1253 01/09/20 0045 01/09/20 0045 01/09/20 1844 01/10/20 0307 01/11/20 1230  NA 138   < > 137  --   --  137 135  K 5.5*   < > 3.6  --   --  3.4* 3.7  CL 103   < > 99  --   --  103 102  CO2 18*   < > 23  --   --  23 23  GLUCOSE 467*   < > 395*  --   --  264* 293*  BUN 17   < > 21  --   --  24* 22  CREATININE 1.00   < > 1.02*   < > 1.06* 0.84 0.85  CALCIUM 9.6   < > 9.2  --   --  9.1 8.7*  PROT 7.3  --  6.2*  --   --   --  6.1*  ALBUMIN 3.6  --  2.9*  --   --   --  2.6*  AST 66*  --  153*  --   --   --  24  ALT 28  --  33  --   --   --  22  ALKPHOS 69  --  50  --   --   --  49  BILITOT 1.5*  --  0.5  --   --   --  0.7  GFRNONAA 56*   < > 55*   < > 52* >60 >60  GFRAA >60   < > >60   < > >60 >60 >60  ANIONGAP 17*   < > 15  --   --  11 10   < > = values in this interval not displayed.     Hematology Recent Labs  Lab 01/09/20 0045 01/09/20 1844 01/11/20 1230  WBC 11.2* 9.0 7.5  RBC 4.73 4.22 4.41  HGB 14.6 13.1 13.5  HCT 42.5 39.2 40.4  MCV 89.9 92.9 91.6  MCH 30.9 31.0 30.6  MCHC 34.4 33.4 33.4  RDW 12.0 12.2 12.1  PLT 195 169 170    BNP Recent Labs  Lab 01/08/20 1500  BNP 655.6*     Lipid Panel     Component Value Date/Time   CHOL 168 01/11/2020 0332   TRIG 151 (H) 01/11/2020 0332   HDL 38 (L) 01/11/2020 0332   CHOLHDL  4.4 01/11/2020 0332   VLDL 30 01/11/2020 0332   LDLCALC 100 (H) 01/11/2020 0332   LDLDIRECT 155.1 06/05/2012 1340   DDimer No results for input(s): DDIMER in the last 168 hours.   Radiology    CARDIAC CATHETERIZATION  Result Date: 01/09/2020  Mid RCA lesion is 100% stenosed.  Ost RCA to Prox RCA lesion is 50% stenosed.  Previously placed Prox Graft stent (unknown type) is widely patent.  Mid Graft lesion is 65% stenosed.  Origin lesion is 100% stenosed.  LIMA and is normal in caliber.  The graft exhibits no disease.  Mid LAD to Dist LAD lesion is 80% stenosed.  Origin to Prox Graft lesion is 95% stenosed.  Post intervention, there is a 10% residual stenosis.  Balloon angioplasty was performed using a BALLOON [place] EMERGE MR 3.0X20.  Dist LM lesion is 80% stenosed.  Ost Cx to Prox Cx lesion is 100% stenosed.  Ost LAD to Prox LAD lesion is 100% stenosed.  1.  Severe underlying three-vessel coronary artery disease with occluded native vessels. 2.  Known chronically occluded SVG to right PDA.  LIMA to LAD is patent.  However, there is chronic diffuse disease in the mid and distal LAD.  SVG to diagonal is patent with no significant in-stent restenosis.  However, there is a borderline 60 to 70% lesion after the stent.  SVG to OM is patent with severe 95% in-stent restenosis proximally which is the likely culprit for myocardial infarction. 3.  Left ventricular angiography was not performed.  EF was 30 to 35% by echo.  Mildly elevated left ventricular end-diastolic pressure at 16 mmHg. 4.  Successful balloon angioplasty of SVG to OM for severe in-stent restenosis. Recommendations: This was a very difficult  and prolonged procedure from the left radial artery given inability to engage the SVG to OM.  There is significant tortuosity in the left subclavian artery.  I had to switch to a femoral approach. Avoid left radial catheterization in the future. Continue dual antiplatelet therapy indefinitely if  possible. Aggressive treatment of risk factors. The lesion in the SVG to diagonal can likely be monitored for now.  PCI can be considered if she has residual angina.    Cardiac Studies   Left Heart Catheterization 01/09/2020:  Mid RCA lesion is 100% stenosed.  Ost RCA to Prox RCA lesion is 50% stenosed.  Previously placed Prox Graft stent (unknown type) is widely patent.  Mid Graft lesion is 65% stenosed.  Origin lesion is 100% stenosed.  LIMA and is normal in caliber.  The graft exhibits no disease.  Mid LAD to Dist LAD lesion is 80% stenosed.  Origin to Prox Graft lesion is 95% stenosed.  Post intervention, there is a 10% residual stenosis.  Balloon angioplasty was performed using a BALLOON [place] EMERGE MR 3.0X20.  Dist LM lesion is 80% stenosed.  Ost Cx to Prox Cx lesion is 100% stenosed.  Ost LAD to Prox LAD lesion is 100% stenosed.   1.  Severe underlying three-vessel coronary artery disease with occluded native vessels. 2.  Known chronically occluded SVG to right PDA.  LIMA to LAD is patent.  However, there is chronic diffuse disease in the mid and distal LAD.  SVG to diagonal is patent with no significant in-stent restenosis.  However, there is a borderline 60 to 70% lesion after the stent.  SVG to OM is patent with severe 95% in-stent restenosis proximally which is the likely culprit for myocardial infarction. 3.  Left ventricular angiography was not performed.  EF was 30 to 35% by echo.  Mildly elevated left ventricular end-diastolic pressure at 16 mmHg. 4.  Successful balloon angioplasty of SVG to OM for severe in-stent restenosis.  Recommendations: This was a very difficult and prolonged procedure from the left radial artery given inability to engage the SVG to OM.  There is significant tortuosity in the left subclavian artery.  I had to switch to a femoral approach. Avoid left radial catheterization in the future. Continue dual antiplatelet therapy indefinitely if  possible. Aggressive treatment of risk factors. The lesion in the SVG to diagonal can likely be monitored for now.  PCI can be considered if she has residual angina.  Patient Profile     74 y.o. female with a history of CAD s/p CABG in 2000 with PCI in [id number], chronic systolic CHF s/p BiV ICD, atrial fibrillation not on anticoagulation, prior CVA, obstructive sleep apnea, hypertension, hyperlipidemia, type 2 diabetes mellitus who is being seen for the evaluation of altered mental status, hypoxic respiratory failure, and elevated troponin.   Assessment & Plan    NSTEMI with Known CAD s/p CABG  - Patient found down with elevated troponin and CK.  - High-sensitivity troponin peaked at >27,000.  - Echo :LVEF of 30-35% with akinesis of the entire inferior wall, inferolateral wall, and apical segment. - Left heart catheterization yesterday showed known chronically occluded SVG to right PDA. Patent LIMA to LAD; however, there is chronic diffuse disease in the mid and distal LAD. SVG to Diag patent with no significant in-stent restenosis. However, borderline 60-70% stenosis after stent. SVG to OM patent with severe 95% in-stent restenosis proximally which is felt to be culprit lesion. Mildly elevated LVEDP at 16 mmHg. Patient underwent  successful balloon angioplasty of SVG to OM lesion.  -No chest pain - Continue dual antiplatelet therapy with Aspirin and Plavix.  - Continue beta-blocker and statin.   Acute on Chronic Systolic CHF - BNP elevated at 655.  - Chest x-ray showed large diffuse right lung opacity most consistent with pneumonia.  - Echo showed LVEF of 30-35% with grade 2 diastolic dysfunction.  - Received one dose of IV Lasix 40mg  on 4/27. Net positive 996 mL this admission. Weight up 6 lbs since admission. Renal function stable. - LVEDP mildly elevated on cath and received an additional dose of IV Lasix 40mg  today.  Transition to low-dose oral furosemide. - Will stop Lisinopril and start  Losartan 50mg  daily. If renal function and BP allow, may be able to then transition to [facility]..  - Continue to monitor daily weight, strict I/O's, and renal function.   Paroxysmal Atrial Fibrillation - Low atrial fibrillation burden so not on anticoagulation at home.  - Paced ventricular rhythm on telemetry.  - Continue beta-blocker as above.  Hypertension - BP has been soft at times but stable. Most recent BP 137/69.  -Lisinopril was discontinued and losartan added, currently at 50 mg daily.      -Metoprolol succinate titrated to 75 mg yesterday.  BP today improved.  V paced.  Hyperlipidemia -Patient has been on pravastatin.  On further questioning, she was never tried on any other agents.  With F/u  LDL today at 100 recommend discontinuing pravastatin and in its place add rosuvastatin 40 mg.  If after several months she is unable to achieve an LDL less than 70 Zetia should be added to her high potency statin therapy.   Type 2 Diabetes Mellitus - Hemoglobin A1c 12.4.  - Consider adding SGLT2 inhibitor given CHF. - Management per primary team.   For questions or updates, please contact [place] Please consult [email or web address] for contact info under   Okay from cardiac standpoint for discharge today.  Troy Sine, MD, [facility] 01/11/2020 2:21 PM

## 2020-01-11 NOTE — Progress Notes (Signed)
Physical Therapy Treatment Patient Details Name: Laurie [name] MRN: [id number] DOB: Sep 06, 1946 Today's Date: 01/11/2020    History of Present Illness Pt is a 74 y/o female admitted secondary to a fall at home. Pt found to have acute respiratory failure as well as NSTEMI. PMH including but not limited to CAD s/p CABG, CHF, HTN, DM, a-fib and CVA.    PT Comments    Pt admitted with above diagnosis. Pt was able to ambulate with RW and incr distance still needing min assist for safety.  Will have assist at home.  Did not desat below 90% on RA with activity.  Talked with nurse and pt to trial having O2 off for a bit.    Pt currently with functional limitations due to the deficits listed below (see PT Problem List). Pt will benefit from skilled PT to increase their independence and safety with mobility to allow discharge to the venue listed below.     Follow Up Recommendations  Home health PT;Supervision/Assistance - 24 hour     Equipment Recommendations  Rolling walker with 5" wheels    Recommendations for Other Services       Precautions / Restrictions Precautions Precautions: Fall Precaution Comments: watch O2 sats Restrictions Weight Bearing Restrictions: No    Mobility  Bed Mobility Overal bed mobility: Needs Assistance Bed Mobility: Supine to Sit;Sit to Supine     Supine to sit: Min guard Sit to supine: Supervision   General bed mobility comments: increased time, min guard to transition to EOB for line mgmt and safety  Transfers Overall transfer level: Needs assistance Equipment used: Rolling walker (2 wheeled) Transfers: Sit to/from Stand Sit to Stand: Min guard         General transfer comment: min guard to steady, cueing for hand placement and safety   Ambulation/Gait Ambulation/Gait assistance: Min guard Gait Distance (Feet): 150 Feet Assistive device: Rolling walker (2 wheeled) Gait Pattern/deviations: Step-through pattern;Decreased stride length Gait  velocity: decreased Gait velocity interpretation: <1.31 ft/sec, indicative of household ambulator General Gait Details: pt with mild instability with use of RW, requiring close min guard for safety with ambulation with use of RW   Stairs             Wheelchair Mobility    Modified Rankin (Stroke Patients Only)       Balance Overall balance assessment: Needs assistance Sitting-balance support: No upper extremity supported;Feet supported Sitting balance-Leahy Scale: Fair     Standing balance support: Bilateral upper extremity supported;During functional activity Standing balance-Leahy Scale: Poor Standing balance comment: reliant on external support                            Cognition Arousal/Alertness: Awake/alert Behavior During Therapy: WFL for tasks assessed/performed Overall Cognitive Status: Impaired/Different from baseline Area of Impairment: Memory;Safety/judgement;Problem solving;Following commands;Attention;Awareness                   Current Attention Level: Sustained Memory: Decreased short-term memory Following Commands: Follows multi-step commands with increased time Safety/Judgement: Decreased awareness of deficits;Decreased awareness of safety Awareness: Emergent Problem Solving: Requires verbal cues;Slow processing General Comments: patient requires increased time to process and follow multiple step commands, decreased awareness of safety and deficits; decreased STM       Exercises      General Comments        Pertinent Vitals/Pain Pain Assessment: No/denies pain    Home Living  Prior Function            PT Goals (current goals can now be found in the care plan section) Acute Rehab PT Goals Patient Stated Goal: return to independence Progress towards PT goals: Progressing toward goals    Frequency    Min 3X/week      PT Plan Current plan remains appropriate    Co-evaluation               AM-PAC PT "6 Clicks" Mobility   Outcome Measure  Help needed turning from your back to your side while in a flat bed without using bedrails?: None Help needed moving from lying on your back to sitting on the side of a flat bed without using bedrails?: A Little Help needed moving to and from a bed to a chair (including a wheelchair)?: A Little Help needed standing up from a chair using your arms (e.g., wheelchair or bedside chair)?: A Little Help needed to walk in hospital room?: A Little Help needed climbing 3-5 steps with a railing? : A Little 6 Click Score: 19    End of Session Equipment Utilized During Treatment: Gait belt;Oxygen(Needed O2 at rest but did not desat with activity) Activity Tolerance: Patient tolerated treatment well;Patient limited by fatigue Patient left: with call bell/phone within reach;with family/visitor present;in bed Nurse Communication: Mobility status PT Visit Diagnosis: Other abnormalities of gait and mobility (R26.89);Muscle weakness (generalized) (M62.81)     Time: [id number] PT Time Calculation (min) (ACUTE ONLY): 20 min  Charges:  $Gait Training: 8-22 mins                     Kera Deacon W,PT [place] Pager:  [phone]  Office:  [place] 01/11/2020, 4:23 PM

## 2020-01-11 NOTE — TOC Initial Note (Signed)
Transition of Care [facility]) - Initial/Assessment Note    Patient Details  Name: Laurie [name] MRN: [id number] Date of Birth: 09/04/1946  [facility] CM/SW Contact:    Bethena Roys, RN Phone Number: 01/11/2020, 11:49 AM  Clinical Narrative: Patient presented for acute respiratory failure with hypoxia. Prior to arrival patient was from home with spouse and has support of daughter. Case Manager offered Medicare.gov list to family- family chose [facility] for services-referral [facility] to [facility]- start of care to begin by next Wed/ Thursday. Family is agreeable to start time. Case Manager will follow for additional transition of care needs and oxygen needs. Patient currently has oxygen on the room.                  Expected Discharge Plan: [place] Barriers to Discharge: Continued Medical Work up   Patient Goals and CMS Choice   CMS Medicare.gov Compare Post Acute Care list provided to:: Patient Choice offered to / list presented to : Patient  Expected Discharge Plan and Services Expected Discharge Plan: [place] In-house Referral: NA Discharge Planning Services: CM Consult Post Acute Care Choice: [place] arrangements for the past 2 months: Single Family Home                  HH Arranged: PT, OT HH Agency: [facility] (formerly [employer]) Date [place]: 01/11/20 Time HH Agency Contacted: 1000 Representative spoke with at [place]: [place]  Prior Living Arrangements/Services Living arrangements for the past 2 months: [place] with:: Spouse(has support of daughter as well.) Patient language and need for interpreter reviewed:: Yes Do you feel safe going back to the place where you live?: Yes      Need for Family Participation in Patient Care: Yes (Comment) Care giver support system in place?: Yes (comment)   Criminal Activity/Legal  Involvement Pertinent to Current Situation/Hospitalization: No - Comment as needed  Activities of Daily Living Home Assistive Devices/Equipment: Cane (specify quad or straight) ADL Screening (condition at time of admission) Patient's cognitive ability adequate to safely complete daily activities?: Yes Is the patient deaf or have difficulty hearing?: No Does the patient have difficulty seeing, even when wearing glasses/contacts?: No Does the patient have difficulty concentrating, remembering, or making decisions?: Yes Patient able to express need for assistance with ADLs?: Yes Does the patient have difficulty dressing or bathing?: No Independently performs ADLs?: Yes (appropriate for developmental age) Does the patient have difficulty walking or climbing stairs?: Yes Weakness of Legs: Both Weakness of Arms/Hands: None  Permission Sought/Granted Permission sought to share information with : Family Supports, Chartered certified accountant granted to share information with : Yes, Verbal Permission Granted     Permission granted to share info w AGENCY: [facility]        Emotional Assessment Appearance:: Appears stated age Attitude/Demeanor/Rapport: Engaged Affect (typically observed): Appropriate Orientation: : Oriented to Situation, Oriented to  Time, Oriented to Place, Oriented to Self Alcohol / Substance Use: Not Applicable Psych Involvement: No (comment)  Admission diagnosis:  NSTEMI (non-ST elevated myocardial infarction) ([place]) [I21.4] Acute respiratory failure with hypoxia (HCC) [J96.01] Altered mental status, unspecified altered mental status type [R41.82] Acute hypoxemic respiratory failure ([place]) [J96.01] Community acquired pneumonia of right lung, unspecified part of lung [J18.9] Patient Active Problem List   Diagnosis Date Noted  . Acute respiratory failure with hypoxia ([place]) 01/08/2020  .  Elevated troponin 01/08/2020  . Paroxysmal atrial fibrillation  ([place]) 08/04/2018  . Type 2 diabetes mellitus with diabetic neuropathy, unspecified ([place]) 01/09/2018  . Unstable angina ([place]) 10/27/2017  . Biventricular automatic implantable cardioverter defibrillator in situ 10/24/2015  . Chronic systolic CHF (congestive heart failure) ([place]) 10/22/2015  . S/P CABG (coronary artery bypass graft)   . NSTEMI (non-ST elevated myocardial infarction) ([place]) 10/21/2012  . Hyperlipidemia 04/18/2012  . Obesity (BMI 30-39.9)   . Sleep apnea   . Peripheral neuropathy   . HIstory of Bell's palsy   . Multinodular goiter   . Hypertensive heart disease   . CAD (coronary artery disease)    PCP:  Tisovec, Fransico Him, MD Pharmacy:   [facility] [address] [place] - [address] [place] [address] Phone: [phone] Fax: [phone]  Readmission Risk Interventions No flowsheet data found.

## 2020-01-11 NOTE — Progress Notes (Signed)
[id number] Watched pt's sast while I completed education with pt and husband. Sats at 86-87%RA. Put on 2L and sats to 91%. Pt denied SOB. Gave pt CHF booklet and discussed when to call MD with signs/symptoms of CHF. With low EF, discussed importance of daily weights, 2000 mg sodium restriction, and watching for swelling. Reviewed NTG use, MI restrictions, gave low sodium, heart healthy and diabetic diets. Discussed with pt that her A1C elevated at over 12 and needs to be lower. Encouraged her to follow up with DR Loanne Drilling who she stated is managing her diabetes.  Husband present for ed. Pt and husband stated understanding of ed. Gave written materials that can be reviewed when needed. Referring to [place] CRP 2. Will let PT see today as pt had difficulty walking with me yesterday and had tendency to go to right side with walker. Graylon Good RN BSN 01/11/2020 9:52 AM

## 2020-01-11 NOTE — Progress Notes (Signed)
Patient refused CPAP for tonight, states she would maybe like to wear it tomorrow. Explained if she changed her mind to have the RN call and I would come and place her on the CPAP unit.

## 2020-01-11 NOTE — Progress Notes (Signed)
PROGRESS NOTE    Laurie [name]  [id number] DOB: November 14, 1945 DOA: 01/08/2020 PCP: Haywood Pao, MD   Brief Narrative:  Laurie [name] is a 74 y.o. female with medical history significant for CAD, obstructive with four-vessel CABG in 2000, status post AICD placement, chronic systolic heart failure, EF 45% March 2019, unspecified hepatitis, hyperlipidemia, multinodular goiter, obstructive sleep apnea on CPAP since 2018, peripheral neuropathy, TIA, insulin-dependent type 2 diabetes with questionable history of dementia who presents after being found down per report in the ED.  Patient is a very poor historian given her current state with questionable history of dementia.  Majority of history comes from chart review, physician in the ED and family.  Per husband patient has been somewhat altered over the past 3 to 4 days with worsening memory but no formal diagnosis of dementia, she had very poor p.o. intake but otherwise no complaints other than "sciatic hip pain" and that she is reported no nausea vomiting diarrhea constipation headache fevers or chills to the best of his knowledge, again the patient has been a poor historian for him as well over the past few days.  This morning at approximately 3 AM the patient's husband awoke to loud noise and found the patient in the living room pinned against a wall after what appears to have been a fall near the couch, to him it appears that she had struck her head on the wall as she was laying in that direction.  Patient declines any falls recently.  Patient's husband also indicates she has had very poor p.o. intake over the past few days to week again without clear etiology.  Denies any new medications, changes in medications or diet other than decreased intake.  Patient was noted to be hypoxic into the 70s per EMS upon arrival.  Placed on nonrebreather and brought to the ED.  In the ED patient had notable hyperkalemia, elevated troponin, elevated BNP and  minimally elevated CK -received Lasix IV 40x1.  Chest x-ray remarkable for large right-sided opacification, EKG notable for ventricularly paced tachycardia.  Cardiology was consulted by the ED for elevated troponin.  Hospitalist was called to admit patient for further evaluation treatment of other comorbid conditions.   Assessment & Plan:   Active Problems:   Multinodular goiter   Hypertensive heart disease   CAD (coronary artery disease)   Hyperlipidemia   Sleep apnea   NSTEMI (non-ST elevated myocardial infarction) (HCC)   Biventricular automatic implantable cardioverter defibrillator in situ   S/P CABG (coronary artery bypass graft)   Chronic systolic CHF (congestive heart failure) ([place])   Type 2 diabetes mellitus with diabetic neuropathy, unspecified (HCC)   Acute respiratory failure with hypoxia (HCC)   Elevated troponin   Acute hypoxic respiratory failure in the setting of sepsis and CAP versus aspiration pneumonia, POA, resolving -Patient placed on nonrebreather in route, continue to wean oxygen as tolerated; daily ambulatory O2 screenings once cleared for physical therapy by cardiology SpO2: 98 % O2 Flow Rate (L/min): 3 L/min -Broad-spectrum coverage including ceftriaxone and azithromycin for commune acquired pneumonia versus aspiration pneumonia -stop date 01/13/2020 -Currently iwthout wheeze, hold steroids given mental status changes as below and patient's advanced age -she is high risk for hospital delirium -Continue nebs, supportive care -Speech eval at bedside then advance diet as tolerated  Elevated troponin, likely NSTEMI secondary to supply demand mismatch History of obstructive CAD status post multiple stent placement and CABG Heart failure, systolic, does not appear to be in acute  exacerbation status post AICD placement -Cardiology following, appreciate insight and recommendations -Status post left heart catheterization 01/09/2020 -chronic diffuse disease status post  balloon angioplasty of SVG to OM lesion -Troponin peak >27k -Echo with 30-35% EF - akinesis of inferior/inferiorlateral/apical sections -Stop lisinopril--> add losartan,continue metoprolol, consider entresto(defer to cards) -Continue euvolemia, strict I/Os  Acute metabolic encephalopathy, likely multifactorial in setting of above infection, hypoxia, rule out polypharmacy, resolving -Unclear etiology, likely in the setting of acute hypoxia and possible CAP versus aspiration pneumonia -Husband declines any recent medication changes -CT head negative for any acute findings, chronic cerebral atrophy noted -Mental status moderately improving over the past 24 hours likely in the setting of pneumonia/infection at this point  IDDM2, uncontrolled - Continue sliding scale insulin, hypoglycemic protocol - Patient on D5 previously - Lantus increased to 20 units HS - titrate accordingly - Patient home med rec includes 600 units of Toujeo, but husband indicates patient does not take this, unclear home medication regimen Lab Results  Component Value Date   HGBA1C 12.4 (H) 01/09/2020   Hyperkalemia,mild, resolved -BMP continues to be within normal limits, i-STAT appears to be hemolyzed -Continue to follow with morning labs -Holding home as needed Lasix -May need to resume patient's home potassium, follow morning labs Lab Results  Component Value Date   K 3.7 01/11/2020    Ambulatory dysfunction, likely multifactorial in the setting of above(multifactorial), POA -Continue PT OT, patient states wife walks with a walker at home at baseline  Obstructive sleep apnea on CPAP since 2018 -Resume CPAP as tolerated at night and while sleeping  DVT prophylaxis: Heparin  Code Status: Full per husband -consistent with previous documentation Family Communication: Husband at bedside Disposition Plan: Inpatient, pending clinical course likely require additional 48 to 72 hours of IV antibiotics in the  setting of poor p.o. intake, oxygen in the setting of pneumonia, further evaluation with cardiology - ultimate disposition yet to be determined, patient from home Consults called: Cardiology Admission status: Inpatient, continues to require IV antibiotics, oxygen, further evaluation with cardiology and possible need for procedure per their expertise  Subjective: No acute issues or events overnight, patient much more awake this morning, husband indicates she is improved but not yet back to baseline, patient oriented to person and place, excited for physical therapy as she would like to get out of bed, otherwise no complaints denies chest pain, shortness of breath, nausea, vomiting, diarrhea, constipation, headache, fevers, chills.  Objective: Vitals:   01/10/20 1327 01/10/20 2200 01/11/20 0420 01/11/20 0908  BP: (!) 106/57 131/68 121/70 (!) 116/52  Pulse: 74 78 66 79  Resp: 20 20 (!) 21   Temp: 98.2 F (36.8 C) 99.8 F (37.7 C) 98.2 F (36.8 C)   TempSrc: Oral Oral Oral   SpO2: 100% 93% 98%   Weight:   84.4 kg   Height:        Intake/Output Summary (Last 24 hours) at 01/11/2020 1431 Last data filed at 01/11/2020 0245 Gross per 24 hour  Intake --  Output 600 ml  Net -600 ml   Filed Weights   01/08/20 1446 01/10/20 0339 01/11/20 0420  Weight: 81.6 kg 84.8 kg 84.4 kg    Examination:  General:  Pleasantly resting in bed, No acute distress.  Alert to person and place only HEENT:  Normocephalic ecchymosis superior-lateral to the right eye.  Sclerae nonicteric, noninjected.  Extraocular movements intact bilaterally. Neck:  Without mass or deformity.  Trachea is midline. Lungs: Diminished breath sounds bibasilarly, right  sided rhonchi otherwise without overt wheeze or rales. Heart: Tachycardic regular rhythm.  Without overt murmurs, rubs, or gallops. Abdomen:  Soft, nontender, nondistended.  Without guarding or rebound. Extremities: Without cyanosis, clubbing, edema, or obvious  deformity. Vascular:  Dorsalis pedis and posterior tibial pulses palpable bilaterally. Skin:  Warm and dry, no erythema, no ulcerations.  Data Reviewed: I have personally reviewed following labs and imaging studies  CBC: Recent Labs  Lab 01/08/20 1241 01/08/20 1253 01/09/20 0045 01/09/20 1844 01/11/20 1230  WBC 12.5*  --  11.2* 9.0 7.5  NEUTROABS 11.1*  --   --   --   --   HGB 16.5* 17.0* 14.6 13.1 13.5  HCT 48.6* 50.0* 42.5 39.2 40.4  MCV 90.7  --  89.9 92.9 91.6  PLT 237  --  195 169 [id number]   Basic Metabolic Panel: Recent Labs  Lab 01/08/20 1241 01/08/20 1253 01/09/20 0045 01/09/20 1844 01/10/20 0307 01/11/20 1230  NA 138 133* 137  --  137 135  K 5.5* 7.4* 3.6  --  3.4* 3.7  CL 103  --  99  --  103 102  CO2 18*  --  23  --  23 23  GLUCOSE 467*  --  395*  --  264* 293*  BUN 17  --  21  --  24* 22  CREATININE 1.00  --  1.02* 1.06* 0.84 0.85  CALCIUM 9.6  --  9.2  --  9.1 8.7*   GFR: Estimated Creatinine Clearance: 58.1 mL/min (by C-G formula based on SCr of 0.85 mg/dL). Liver Function Tests: Recent Labs  Lab 01/08/20 1241 01/09/20 0045 01/11/20 1230  AST 66* 153* 24  ALT 28 33 22  ALKPHOS 69 50 49  BILITOT 1.5* 0.5 0.7  PROT 7.3 6.2* 6.1*  ALBUMIN 3.6 2.9* 2.6*   No results for input(s): LIPASE, AMYLASE in the last 168 hours. No results for input(s): AMMONIA in the last 168 hours. Coagulation Profile: No results for input(s): INR, PROTIME in the last 168 hours. Cardiac Enzymes: Recent Labs  Lab 01/08/20 1241  CKTOTAL 421*   BNP (last 3 results) No results for input(s): PROBNP in the last 8760 hours. HbA1C: Recent Labs    01/09/20 0050  HGBA1C 12.4*   CBG: Recent Labs  Lab 01/10/20 1149 01/10/20 1727 01/10/20 2158 01/11/20 0830 01/11/20 1127  GLUCAP 294* 263* 273* 236* 293*   Lipid Profile: Recent Labs    01/11/20 0332  CHOL 168  HDL 38*  LDLCALC 100*  TRIG 151*  CHOLHDL 4.4   Thyroid Function Tests: No results for input(s):  TSH, T4TOTAL, FREET4, T3FREE, THYROIDAB in the last 72 hours. Anemia Panel: No results for input(s): VITAMINB12, FOLATE, FERRITIN, TIBC, IRON, RETICCTPCT in the last 72 hours. Sepsis Labs: No results for input(s): PROCALCITON, LATICACIDVEN in the last 168 hours.  Recent Results (from the past 240 hour(s))  Respiratory Panel by RT PCR (Flu A&B, Covid) - Nasopharyngeal Swab     Status: None   Collection Time: 01/08/20 12:40 PM   Specimen: Nasopharyngeal Swab  Result Value Ref Range Status   SARS Coronavirus 2 by RT PCR NEGATIVE NEGATIVE Final    Comment: (NOTE) SARS-CoV-2 target nucleic acids are NOT DETECTED. The SARS-CoV-2 RNA is generally detectable in upper respiratoy specimens during the acute phase of infection. The lowest concentration of SARS-CoV-2 viral copies this assay can detect is 131 copies/mL. A negative result does not preclude SARS-Cov-2 infection and should not be used as the sole basis  for treatment or other patient management decisions. A negative result may occur with  improper specimen collection/handling, submission of specimen other than nasopharyngeal swab, presence of viral mutation(s) within the areas targeted by this assay, and inadequate number of viral copies (<131 copies/mL). A negative result must be combined with clinical observations, patient history, and epidemiological information. The expected result is Negative. Fact Sheet for Patients:  PinkCheek.be Fact Sheet for Healthcare Providers:  GravelBags.it This test is not yet ap proved or cleared by the [place] FDA and  has been authorized for detection and/or diagnosis of SARS-CoV-2 by FDA under an Emergency Use Authorization (EUA). This EUA will remain  in effect (meaning this test can be used) for the duration of the COVID-19 declaration under Section 564(b)(1) of the Act, 21 U.S.C. section 360bbb-3(b)(1), unless the authorization is  terminated or revoked sooner.    Influenza A by PCR NEGATIVE NEGATIVE Final   Influenza B by PCR NEGATIVE NEGATIVE Final    Comment: (NOTE) The Xpert Xpress SARS-CoV-2/FLU/RSV assay is intended as an aid in  the diagnosis of influenza from Nasopharyngeal swab specimens and  should not be used as a sole basis for treatment. Nasal washings and  aspirates are unacceptable for Xpert Xpress SARS-CoV-2/FLU/RSV  testing. Fact Sheet for Patients: PinkCheek.be Fact Sheet for Healthcare Providers: GravelBags.it This test is not yet approved or cleared by the [place] FDA and  has been authorized for detection and/or diagnosis of SARS-CoV-2 by  FDA under an Emergency Use Authorization (EUA). This EUA will remain  in effect (meaning this test can be used) for the duration of the  Covid-19 declaration under Section 564(b)(1) of the Act, 21  U.S.C. section 360bbb-3(b)(1), unless the authorization is  terminated or revoked. Performed at [facility], [place] [address] [place], [place] [address]     Radiology Studies: CARDIAC CATHETERIZATION  Result Date: 01/09/2020  Mid RCA lesion is 100% stenosed.  Ost RCA to Prox RCA lesion is 50% stenosed.  Previously placed Prox Graft stent (unknown type) is widely patent.  Mid Graft lesion is 65% stenosed.  Origin lesion is 100% stenosed.  LIMA and is normal in caliber.  The graft exhibits no disease.  Mid LAD to Dist LAD lesion is 80% stenosed.  Origin to Prox Graft lesion is 95% stenosed.  Post intervention, there is a 10% residual stenosis.  Balloon angioplasty was performed using a BALLOON [place] EMERGE MR 3.0X20.  Dist LM lesion is 80% stenosed.  Ost Cx to Prox Cx lesion is 100% stenosed.  Ost LAD to Prox LAD lesion is 100% stenosed.  1.  Severe underlying three-vessel coronary artery disease with occluded native vessels. 2.  Known chronically occluded SVG to right PDA.  LIMA to LAD  is patent.  However, there is chronic diffuse disease in the mid and distal LAD.  SVG to diagonal is patent with no significant in-stent restenosis.  However, there is a borderline 60 to 70% lesion after the stent.  SVG to OM is patent with severe 95% in-stent restenosis proximally which is the likely culprit for myocardial infarction. 3.  Left ventricular angiography was not performed.  EF was 30 to 35% by echo.  Mildly elevated left ventricular end-diastolic pressure at 16 mmHg. 4.  Successful balloon angioplasty of SVG to OM for severe in-stent restenosis. Recommendations: This was a very difficult and prolonged procedure from the left radial artery given inability to engage the SVG to OM.  There is significant tortuosity in the  left subclavian artery.  I had to switch to a femoral approach. Avoid left radial catheterization in the future. Continue dual antiplatelet therapy indefinitely if possible. Aggressive treatment of risk factors. The lesion in the SVG to diagonal can likely be monitored for now.  PCI can be considered if she has residual angina.    Scheduled Meds: . aspirin  81 mg Oral Daily  . aspirin  300 mg Rectal Once  . clopidogrel  75 mg Oral Q breakfast  . enoxaparin (LOVENOX) injection  40 mg Subcutaneous Q24H  . insulin aspart  0-5 Units Subcutaneous QHS  . insulin aspart  0-9 Units Subcutaneous TID WC  . insulin glargine  20 Units Subcutaneous QHS  . losartan  50 mg Oral Daily  . metoprolol succinate  75 mg Oral Daily  . multivitamin  1 tablet Oral Daily  . pravastatin  40 mg Oral q1800  . sodium chloride flush  3 mL Intravenous Q12H   Continuous Infusions: . sodium chloride    . azithromycin 250 mg (01/11/20 0916)  . cefTRIAXone (ROCEPHIN)  IV 1 g (01/10/20 1845)     LOS: 3 days   Time spent: 5min  Laurie Stockburger C Torben Soloway, DO [facility]  If 7PM-7AM, please contact night-coverage [email or web address]  01/11/2020, 2:31 PM

## 2020-01-12 LAB — COMPREHENSIVE METABOLIC PANEL
ALT: 20 U/L (ref 0–44)
AST: 19 U/L (ref 15–41)
Albumin: 2.6 g/dL — ABNORMAL LOW (ref 3.5–5.0)
Alkaline Phosphatase: 46 U/L (ref 38–126)
Anion gap: 9 (ref 5–15)
BUN: 21 mg/dL (ref 8–23)
CO2: 24 mmol/L (ref 22–32)
Calcium: 8.8 mg/dL — ABNORMAL LOW (ref 8.9–10.3)
Chloride: 103 mmol/L (ref 98–111)
Creatinine, Ser: 0.92 mg/dL (ref 0.44–1.00)
GFR calc Af Amer: >60 mL/min (ref >60)
GFR calc non Af Amer: >60 mL/min (ref >60)
Glucose, Bld: 232 mg/dL — ABNORMAL HIGH (ref 70–99)
Potassium: 3.3 mmol/L — ABNORMAL LOW (ref 3.5–5.1)
Sodium: 136 mmol/L (ref 135–145)
Total Bilirubin: 0.5 mg/dL (ref 0.3–1.2)
Total Protein: 6 g/dL — ABNORMAL LOW (ref 6.5–8.1)

## 2020-01-12 LAB — CBC
HCT: 40.6 % (ref 36.0–46.0)
Hemoglobin: 13.5 g/dL (ref 12.0–15.0)
MCH: 30.7 pg (ref 26.0–34.0)
MCHC: 33.3 g/dL (ref 30.0–36.0)
MCV: 92.3 fL (ref 80.0–100.0)
Platelets: 185 10*3/uL (ref 150–400)
RBC: 4.4 MIL/uL (ref 3.87–5.11)
RDW: 11.9 % (ref 11.5–15.5)
WBC: 7.2 10*3/uL (ref 4.0–10.5)
nRBC: 0 % (ref 0.0–0.2)

## 2020-01-12 LAB — GLUCOSE, CAPILLARY: Glucose-Capillary: 204 mg/dL — ABNORMAL HIGH (ref 70–99)

## 2020-01-12 MED ORDER — SPIRONOLACTONE 25 MG PO TABS: 12.5000 mg | ORAL_TABLET | Freq: Every day | ORAL | 0 refills | Status: DC

## 2020-01-12 MED ORDER — CEPHALEXIN 250 MG PO CAPS: 250.0000 mg | ORAL_CAPSULE | Freq: Four times a day (QID) | ORAL | 0 refills | Status: AC

## 2020-01-12 MED ORDER — AZITHROMYCIN 250 MG PO TABS
250.0000 mg | ORAL_TABLET | Freq: Every day | ORAL | 0 refills | Status: AC
Start: 2020-01-12 — End: 2020-01-14

## 2020-01-12 MED ORDER — SACUBITRIL-VALSARTAN 24-26 MG PO TABS
1.0000 | ORAL_TABLET | Freq: Two times a day (BID) | ORAL | Status: DC
  Administered 2020-01-12: 1 via ORAL
  Filled 2020-01-12: qty 1

## 2020-01-12 MED ORDER — SODIUM CHLORIDE 0.9% FLUSH
10.0000 mL | Freq: Two times a day (BID) | INTRAVENOUS | Status: DC
  Administered 2020-01-12: 10 mL

## 2020-01-12 MED ORDER — ROSUVASTATIN CALCIUM 40 MG PO TABS: 40.0000 mg | ORAL_TABLET | Freq: Every day | ORAL | 0 refills | Status: DC

## 2020-01-12 MED ORDER — FUROSEMIDE 40 MG PO TABS
40.0000 mg | ORAL_TABLET | Freq: Every day | ORAL | Status: DC
  Administered 2020-01-12: 40 mg via ORAL
  Filled 2020-01-12: qty 1

## 2020-01-12 MED ORDER — SACUBITRIL-VALSARTAN 24-26 MG PO TABS: 1.0000 | ORAL_TABLET | Freq: Two times a day (BID) | ORAL | 0 refills | Status: DC

## 2020-01-12 MED ORDER — METOPROLOL SUCCINATE ER 25 MG PO TB24: 75.0000 mg | ORAL_TABLET | Freq: Every day | ORAL | 0 refills | Status: DC

## 2020-01-12 MED ORDER — SPIRONOLACTONE 12.5 MG HALF TABLET
12.5000 mg | ORAL_TABLET | Freq: Every day | ORAL | Status: DC
  Administered 2020-01-12: 12.5 mg via ORAL
  Filled 2020-01-12: qty 1

## 2020-01-12 MED ORDER — SODIUM CHLORIDE 0.9% FLUSH: 10.0000 mL | INTRAVENOUS | Status: DC | PRN

## 2020-01-12 MED ORDER — INSULIN GLARGINE 100 UNIT/ML ~~LOC~~ SOLN: 20.0000 [IU] | Freq: Every day | SUBCUTANEOUS | 11 refills | Status: DC

## 2020-01-12 NOTE — Progress Notes (Signed)
Progress Note  Patient Name: Laurie [name] Date of Encounter: 01/12/2020  Primary Cardiologist: Dr Bettina Gavia  Subjective   No CP or dyspnea  Inpatient Medications    Scheduled Meds: . aspirin  81 mg Oral Daily  . aspirin  300 mg Rectal Once  . clopidogrel  75 mg Oral Q breakfast  . enoxaparin (LOVENOX) injection  40 mg Subcutaneous Q24H  . insulin aspart  0-5 Units Subcutaneous QHS  . insulin aspart  0-9 Units Subcutaneous TID WC  . insulin glargine  20 Units Subcutaneous QHS  . losartan  50 mg Oral Daily  . metoprolol succinate  75 mg Oral Daily  . multivitamin  1 tablet Oral Daily  . rosuvastatin  40 mg Oral Daily  . sodium chloride flush  3 mL Intravenous Q12H   Continuous Infusions: . sodium chloride    . azithromycin 250 mg (01/11/20 0916)  . cefTRIAXone (ROCEPHIN)  IV 1 g (01/11/20 1742)   PRN Meds: sodium chloride, acetaminophen, ipratropium-albuterol, nitroGLYCERIN, ondansetron (ZOFRAN) IV, sodium chloride flush   Vital Signs    Vitals:   01/11/20 0908 01/11/20 1501 01/11/20 2027 01/12/20 0349  BP: (!) 116/52 112/61 (!) 121/53 126/70  Pulse: 79 71 74 72  Resp:   (!) 22 14  Temp:  99.4 F (37.4 C) 98.6 F (37 C) 98.9 F (37.2 C)  TempSrc:  Oral Oral Oral  SpO2:  93% 95% 92%  Weight:    84 kg  Height:        Intake/Output Summary (Last 24 hours) at 01/12/2020 0804 Last data filed at 01/12/2020 0300 Gross per 24 hour  Intake 690 ml  Output --  Net 690 ml   Last 3 Weights 01/12/2020 01/11/2020 01/10/2020  Weight (lbs) 185 lb 3.2 oz 186 lb 1.1 oz 186 lb 15.2 oz  Weight (kg) 84.006 kg 84.4 kg 84.8 kg      Telemetry    Vpaced - Personally Reviewed   Physical Exam   GEN: No acute distress.   Neck: No JVD Cardiac: RRR, no murmurs, rubs, or gallops.  Respiratory: Clear to auscultation bilaterally. GI: Soft, nontender, non-distended  MS: No edema; Radial cath site with no hematoma Neuro:  Nonfocal  Psych: Normal affect   Labs    High  Sensitivity Troponin:   Recent Labs  Lab 01/08/20 1241 01/08/20 1500 01/08/20 1912  TROPONINIHS 4,694* 13,568* >27,000*      Chemistry Recent Labs  Lab 01/09/20 0045 01/09/20 1844 01/10/20 0307 01/11/20 1230 01/12/20 0252  NA 137  --  137 135 136  K 3.6  --  3.4* 3.7 3.3*  CL 99  --  103 102 103  CO2 23  --  23 23 24   GLUCOSE 395*  --  264* 293* 232*  BUN 21  --  24* 22 21  CREATININE 1.02*   < > 0.84 0.85 0.92  CALCIUM 9.2  --  9.1 8.7* 8.8*  PROT 6.2*  --   --  6.1* 6.0*  ALBUMIN 2.9*  --   --  2.6* 2.6*  AST 153*  --   --  24 19  ALT 33  --   --  22 20  ALKPHOS 50  --   --  49 46  BILITOT 0.5  --   --  0.7 0.5  GFRNONAA 55*   < > >60 >60 >60  GFRAA >60   < > >60 >60 >60  ANIONGAP 15  --  11 10 9    < > =  values in this interval not displayed.     Hematology Recent Labs  Lab 01/09/20 1844 01/11/20 1230 01/12/20 0252  WBC 9.0 7.5 7.2  RBC 4.22 4.41 4.40  HGB 13.1 13.5 13.5  HCT 39.2 40.4 40.6  MCV 92.9 91.6 92.3  MCH 31.0 30.6 30.7  MCHC 33.4 33.4 33.3  RDW 12.2 12.1 11.9  PLT 169 170 185    BNP Recent Labs  Lab 01/08/20 1500  BNP 655.6*     Patient Profile     74 y.o. female with a history of CAD s/p CABG in 2000 with PCI in [id number], chronic systolic CHF s/p BiV ICD, atrial fibrillation not on anticoagulation, prior CVA, obstructive sleep apnea, hypertension, hyperlipidemia, type 2 diabetes mellitus who is being seen for the evaluation of altered mental status, hypoxic respiratory failure, and elevated troponin.  Echocardiogram this admission shows ejection fraction 30 to [id number], grade 2 diastolic dysfunction, mild left atrial enlargement, mild mitral regurgitation.  Cardiac catheterization revealed severe three-vessel coronary disease, known occluded saphenous vein graft to the PDA, patent LIMA to LAD, patent saphenous vein graft to the diagonal and 95% in-stent restenosis in the saphenous vein graft to obtuse marginal.  Patient had successful angioplasty of  saphenous vein graft to obtuse marginal.  Assessment & Plan    1 coronary artery disease-patient is status post PCI of saphenous vein graft to obtuse marginal.  Continue aspirin, Plavix and statin.  2 ischemic cardiomyopathy-continue Toprol at present dose.  Discontinue losartan and treat with Entresto 24/26 twice daily.  Follow renal function.  3 acute on chronic systolic congestive heart failure-resume Lasix 40 mg daily and spironolactone 12.5 mg daily.  Follow renal function.  4 prior biventricular ICD  5 hyperlipidemia-continue statin.  6 history of paroxysmal atrial fibrillation-patient not on anticoagulation for unclear reasons.  Would continue aspirin and Plavix following recent PCI.  Will need close follow-up with your primary cardiologist.  At that time would consider discontinuing aspirin and continuing Plavix with addition of apixaban long-term.  7 hypertension-patient's blood pressure is controlled.  Continue present medications.  8 pneumonia-Per primary care.  For questions or updates, please contact [place] Please consult [email or web address] for contact info under        Signed, Kirk Ruths, MD  01/12/2020, 8:04 AM

## 2020-01-12 NOTE — Discharge Instructions (Signed)
Diabetes Mellitus and Sick Day Management Blood sugar (glucose) can be difficult to control when you are sick. Common illnesses that can cause problems for people with diabetes (diabetes mellitus) include colds, fever, flu (influenza), nausea, vomiting, and diarrhea. These illnesses can cause stress and loss of body fluids (dehydration), and those issues can cause blood glucose levels to increase. Because of this, it is very important to take your insulin and diabetes medicines and eat some form of carbohydrate when you are sick. You should make a plan for days when you are sick (sick day plan) as part of your diabetes management plan. You and your health care provider should make this plan in advance. The following guidelines are intended to help you manage an illness that lasts for about 24 hours or less. Your health care provider may also give you more specific instructions. What do I need to do to manage my blood glucose?   Check your blood glucose every 2-4 hours, or as often as told by your health care provider.  Know your sick day treatment goals. Your target blood glucose levels may be different when you are sick.  If you use insulin, take your usual dose. ? If your blood glucose continues to be too high, you may need to take an additional insulin dose as told by your health care provider.  If you use oral diabetes medicine, you may need to stop taking it if you are not able to eat or drink normally. Ask your health care provider about whether you need to stop taking these medicines while you are sick.  If you use injectable hormone medicines other than insulin to control your diabetes, ask your health care provider about whether you need to stop taking these medicines while you are sick. What else can I do to manage my diabetes when I am sick? Check your ketones  If you have type 1 diabetes, check your urine ketones every 4 hours.  If you have type 2 diabetes, check your urine ketones  as often as told by your health care provider. Drink fluids  Drink enough fluid to keep your urine clear or pale yellow. This is especially important if you have a fever, vomiting, or diarrhea. Those symptoms can lead to dehydration.  Follow any instructions from your health care provider about beverages to avoid. ? Do not drink alcohol, caffeine, or drinks that contain a lot of sugar. Take medicines as directed  Take-over-the-counter and prescription medicines only as told by your health care provider.  Check medicine labels for added sugars. Some medicines may contain sugar or types of sugars that can raise your blood glucose level. What foods can I eat when I am sick?  You need to eat some form of carbohydrates when you are sick. You should eat 45-50 grams (45-50 g) of carbohydrates every 3-4 hours until you feel better. All of the food choices below contain about 15 g of carbohydrates. Plan ahead and keep some of these foods around so you have them if you get sick.  4-6 oz (120-177 mL) carbonated beverage that contains sugar, such as regular (not diet) soda. You may be able to drink carbonated beverages more easily if you open the beverage and let it sit at room temperature for a few minutes before drinking.   of a twin frozen ice pop.  4 oz (120 g) regular gelatin.  4 oz (120 mL) fruit juice.  4 oz (120 g) ice cream or frozen yogurt.  2  oz (60 g) sherbet.  8 oz (240 mL) clear broth or soup.  4 oz (120 g) regular custard.  4 oz (120 g) regular pudding.  8 oz (240 g) plain yogurt.  1 slice bread or toast.  6 saltine crackers.  5 vanilla wafers. Questions to ask your health care provider Consider asking the following questions so you know what to do on days when you are sick:  Should I adjust my diabetes medicines?  How often do I need to check my blood glucose?  What supplies do I need to manage my diabetes at home when I am sick?  What number can I call if I  have questions?  What foods and drinks should I avoid? Contact a health care provider if:  You develop symptoms of diabetic ketoacidosis, such as: ? Fatigue. ? Weight loss. ? Excessive thirst. ? Light-headedness. ? Fruity or sweet-smelling breath. ? Excessive urination. ? Vision changes. ? Confusion or irritability. ? Nausea. ? Vomiting. ? Rapid breathing. ? Pain in the abdomen. ? Feeling flushed.  You are unable to drink fluids without vomiting.  You have any of the following for more than 6 hours: ? Nausea. ? Vomiting. ? Diarrhea.  Your blood glucose is at or above 240 mg/dL (13.3 mmol/L), even after you take an additional insulin dose.  You have a change in how you think, feel, or act (mental status).  You develop another serious illness.  You have been sick or have had a fever for 2 days or longer and you are not getting better. Get help right away if:  Your blood glucose is lower than 54 mg/dL (3.0 mmol/L).  You have difficulty breathing.  You have moderate or high ketone levels in your urine.  You used emergency glucagon to treat low blood glucose. Summary  Blood sugar (glucose) can be difficult to control when you are sick. Common illnesses that can cause problems for people with diabetes (diabetes mellitus) include colds, fever, flu (influenza), nausea, vomiting, and diarrhea.  Illnesses can cause stress and loss of body fluids (dehydration), and those issues can cause blood glucose levels to increase.  Make a plan for days when you are sick (sick day plan) as part of your diabetes management plan. You and your health care provider should make this plan in advance.  It is very important to take your insulin and diabetes medicines and to eat some form of carbohydrate when you are sick.  Contact your health care provider if have problems managing your blood glucose levels when you are sick, or if you have been sick or had a fever for 2 days or longer and are  not getting better. This information is not intended to replace advice given to you by your health care provider. Make sure you discuss any questions you have with your health care provider. Document Revised: 05/28/2016 Document Reviewed: 05/28/2016 Elsevier Patient Education  [place]. Blood Glucose Monitoring, Adult Monitoring your blood sugar (glucose) is an important part of managing your diabetes (diabetes mellitus). Blood glucose monitoring involves checking your blood glucose as often as directed and keeping a record (log) of your results over time. Checking your blood glucose regularly and keeping a blood glucose log can:  Help you and your health care provider adjust your diabetes management plan as needed, including your medicines or insulin.  Help you understand how food, exercise, illnesses, and medicines affect your blood glucose.  Let you know what your blood glucose is at any  time. You can quickly find out if you have low blood glucose (hypoglycemia) or high blood glucose (hyperglycemia). Your health care provider will set individualized treatment goals for you. Your goals will be based on your age, other medical conditions you have, and how you respond to diabetes treatment. Generally, the goal of treatment is to maintain the following blood glucose levels:  Before meals (preprandial): 80-130 mg/dL (4.4-7.2 mmol/L).  After meals (postprandial): below 180 mg/dL (10 mmol/L).  A1c level: less than 7%. Supplies needed:  Blood glucose meter.  Test strips for your meter. Each meter has its own strips. You must use the strips that came with your meter.  A needle to prick your finger (lancet). Do not use a lancet more than one time.  A device that holds the lancet (lancing device).  A journal or log book to write down your results. How to check your blood glucose  1. Wash your hands with soap and water. 2. Prick the side of your finger (not the tip) with the  lancet. Use a different finger each time. 3. Gently rub the finger until a small drop of blood appears. 4. Follow instructions that come with your meter for inserting the test strip, applying blood to the strip, and using your blood glucose meter. 5. Write down your result and any notes. Some meters allow you to use areas of your body other than your finger (alternative sites) to test your blood. The most common alternative sites are:  Forearm.  Thigh.  Palm of the hand. If you think you may have hypoglycemia, or if you have a history of not knowing when your blood glucose is getting low (hypoglycemia unawareness), do not use alternative sites. Use your finger instead. Alternative sites may not be as accurate as the fingers, because blood flow is slower in these areas. This means that the result you get may be delayed, and it may be different from the result that you would get from your finger. Follow these instructions at home: Blood glucose log   Every time you check your blood glucose, write down your result. Also write down any notes about things that may be affecting your blood glucose, such as your diet and exercise for the day. This information can help you and your health care provider: ? Look for patterns in your blood glucose over time. ? Adjust your diabetes management plan as needed.  Check if your meter allows you to download your records to a computer. Most glucose meters store a record of glucose readings in the meter. If you have type 1 diabetes:  Check your blood glucose 2 or more times a day.  Also check your blood glucose: ? Before every insulin injection. ? Before and after exercise. ? Before meals. ? 2 hours after a meal. ? Occasionally between 2:00 a.m. and 3:00 a.m., as directed. ? Before potentially dangerous tasks, like driving or using heavy machinery. ? At bedtime.  You may need to check your blood glucose more often, up to 6-10 times a day, if you: ? Use  an insulin pump. ? Need multiple daily injections (MDI). ? Have diabetes that is not well-controlled. ? Are ill. ? Have a history of severe hypoglycemia. ? Have hypoglycemia unawareness. If you have type 2 diabetes:  If you take insulin or other diabetes medicines, check your blood glucose 2 or more times a day.  If you are on intensive insulin therapy, check your blood glucose 4 or more times a day.  Occasionally, you may also need to check between 2:00 a.m. and 3:00 a.m., as directed.  Also check your blood glucose: ? Before and after exercise. ? Before potentially dangerous tasks, like driving or using heavy machinery.  You may need to check your blood glucose more often if: ? Your medicine is being adjusted. ? Your diabetes is not well-controlled. ? You are ill. General tips  Always keep your supplies with you.  If you have questions or need help, all blood glucose meters have a 24-hour "hotline" phone number that you can call. You may also contact your health care provider.  After you use a few boxes of test strips, adjust (calibrate) your blood glucose meter by following instructions that came with your meter. Contact a health care provider if:  Your blood glucose is at or above 240 mg/dL (13.3 mmol/L) for 2 days in a row.  You have been sick or have had a fever for 2 days or longer, and you are not getting better.  You have any of the following problems for more than 6 hours: ? You cannot eat or drink. ? You have nausea or vomiting. ? You have diarrhea. Get help right away if:  Your blood glucose is lower than 54 mg/dL (3 mmol/L).  You become confused or you have trouble thinking clearly.  You have difficulty breathing.  You have moderate or large ketone levels in your urine. Summary  Monitoring your blood sugar (glucose) is an important part of managing your diabetes (diabetes mellitus).  Blood glucose monitoring involves checking your blood glucose as often  as directed and keeping a record (log) of your results over time.  Your health care provider will set individualized treatment goals for you. Your goals will be based on your age, other medical conditions you have, and how you respond to diabetes treatment.  Every time you check your blood glucose, write down your result. Also write down any notes about things that may be affecting your blood glucose, such as your diet and exercise for the day. This information is not intended to replace advice given to you by your health care provider. Make sure you discuss any questions you have with your health care provider. Document Revised: 06/23/2018 Document Reviewed: 02/09/2016 Elsevier Patient Education  [place]. Hypoglycemia Hypoglycemia is when the sugar (glucose) level in your blood is too low. Signs of low blood sugar may include:  Feeling: ? Hungry. ? Worried or nervous (anxious). ? Sweaty and clammy. ? Confused. ? Dizzy. ? Sleepy. ? Sick to your stomach (nauseous).  Having: ? A fast heartbeat. ? A headache. ? A change in your vision. ? Tingling or no feeling (numbness) around your mouth, lips, or tongue. ? Jerky movements that you cannot control (seizure).  Having trouble with: ? Moving (coordination). ? Sleeping. ? Passing out (fainting). ? Getting upset easily (irritability). Low blood sugar can happen to people who have diabetes and people who do not have diabetes. Low blood sugar can happen quickly, and it can be an emergency. Treating low blood sugar Low blood sugar is often treated by eating or drinking something sugary right away, such as:  Fruit juice, 4-6 oz (120-150 mL).  Regular soda (not diet soda), 4-6 oz (120-150 mL).  Low-fat milk, 4 oz (120 mL).  Several pieces of hard candy.  Sugar or honey, 1 Tbsp (15 mL). Treating low blood sugar if you have diabetes If you can think clearly and swallow safely, follow the 15:15 rule:  Take 15 grams of a  fast-acting carb (carbohydrate). Talk with your doctor about how much you should take.  Always keep a source of fast-acting carb with you, such as: ? Sugar tablets (glucose pills). Take 3-4 pills. ? 6-8 pieces of hard candy. ? 4-6 oz (120-150 mL) of fruit juice. ? 4-6 oz (120-150 mL) of regular (not diet) soda. ? 1 Tbsp (15 mL) honey or sugar.  Check your blood sugar 15 minutes after you take the carb.  If your blood sugar is still at or below 70 mg/dL (3.9 mmol/L), take 15 grams of a carb again.  If your blood sugar does not go above 70 mg/dL (3.9 mmol/L) after 3 tries, get help right away.  After your blood sugar goes back to normal, eat a meal or a snack within 1 hour.  Treating very low blood sugar If your blood sugar is at or below 54 mg/dL (3 mmol/L), you have very low blood sugar (severe hypoglycemia). This may also cause:  Passing out.  Jerky movements you cannot control (seizure).  Losing consciousness (coma). This is an emergency. Do not wait to see if the symptoms will go away. Get medical help right away. Call your local emergency services (911 in the [place] Do not drive yourself to the hospital. If you have very low blood sugar and you cannot eat or drink, you may need a glucagon shot (injection). A family member or friend should learn how to check your blood sugar and how to give you a glucagon shot. Ask your doctor if you need to have a glucagon shot kit at home. Follow these instructions at home: General instructions  Take over-the-counter and prescription medicines only as told by your doctor.  Stay aware of your blood sugar as told by your doctor.  Limit alcohol intake to no more than 1 drink a day for nonpregnant women and 2 drinks a day for men. One drink equals 12 oz of beer (355 mL), 5 oz of wine (148 mL), or 1 oz of hard liquor (44 mL).  Keep all follow-up visits as told by your doctor. This is important. If you have diabetes:   Follow your diabetes  care plan as told by your doctor. Make sure you: ? Know the signs of low blood sugar. ? Take your medicines as told. ? Follow your exercise and meal plan. ? Eat on time. Do not skip meals. ? Check your blood sugar as often as told by your doctor. Always check it before and after exercise. ? Follow your sick day plan when you cannot eat or drink normally. Make this plan ahead of time with your doctor.  Share your diabetes care plan with: ? Your work or school. ? People you live with.  Check your pee (urine) for ketones: ? When you are sick. ? As told by your doctor.  Carry a card or wear jewelry that says you have diabetes. Contact a doctor if:  You have trouble keeping your blood sugar in your target range.  You have low blood sugar often. Get help right away if:  You still have symptoms after you eat or drink something sugary.  Your blood sugar is at or below 54 mg/dL (3 mmol/L).  You have jerky movements that you cannot control.  You pass out. These symptoms may be an emergency. Do not wait to see if the symptoms will go away. Get medical help right away. Call your local emergency services (911 in the [place] Do  not drive yourself to the hospital. Summary  Hypoglycemia happens when the level of sugar (glucose) in your blood is too low.  Low blood sugar can happen to people who have diabetes and people who do not have diabetes. Low blood sugar can happen quickly, and it can be an emergency.  Make sure you know the signs of low blood sugar and know how to treat it.  Always keep a source of sugar (fast-acting carb) with you to treat low blood sugar. This information is not intended to replace advice given to you by your health care provider. Make sure you discuss any questions you have with your health care provider. Document Revised: 12/21/2018 Document Reviewed: 10/03/2015 Elsevier Patient Education  [employer].   Hemoglobin A1c Test Why am I having this  test? You may have the hemoglobin A1c test (HbA1c test) done to:  Evaluate your risk for developing diabetes (diabetes mellitus).  Diagnose diabetes.  Monitor long-term control of blood sugar (glucose) in people who have diabetes and help make treatment decisions. This test may be done with other blood glucose tests, such as fasting blood glucose and oral glucose tolerance tests. What is being tested? Hemoglobin is a type of protein in the blood that carries oxygen. Glucose attaches to hemoglobin to form glycated hemoglobin. This test checks the amount of glycated hemoglobin in your blood, which is a good indicator of the average amount of glucose in your blood during the past 2-3 months. What kind of sample is taken?  A blood sample is required for this test. It is usually collected by inserting a needle into a blood vessel. Tell a health care provider about:  All medicines you are taking, including vitamins, herbs, eye drops, creams, and over-the-counter medicines.  Any blood disorders you have.  Any surgeries you have had.  Any medical conditions you have.  Whether you are pregnant or may be pregnant. How are the results reported? Your results will be reported as a percentage that indicates how much of your hemoglobin has glucose attached to it (is glycated). Your health care provider will compare your results to normal ranges that were established after testing a large group of people (reference ranges). Reference ranges may vary among labs and hospitals. For this test, common reference ranges are:  Adult or child without diabetes: 4-5.6%.  Adult or child with diabetes and good blood glucose control: less than 7%. What do the results mean? If you have diabetes:  A result of less than 7% is considered normal, meaning that your blood glucose is well controlled.  A result higher than 7% means that your blood glucose is not well controlled, and your treatment plan may need to be  adjusted. If you do not have diabetes:  A result within the reference range is considered normal, meaning that you are not at high risk for diabetes.  A result of 5.7-6.4% means that you have a high risk of developing diabetes, and you may have prediabetes. Prediabetes is the condition of having a blood glucose level that is higher than it should be, but not high enough for you to be diagnosed with diabetes. Having prediabetes puts you at risk for developing type 2 diabetes (type 2 diabetes mellitus). You may have more tests, including a repeat HbA1c test.  Results of 6.5% or higher on two separate HbA1c tests mean that you have diabetes. You may have more tests to confirm the diagnosis. Abnormally low HbA1c values may be caused by:  Pregnancy.  Severe blood loss.  Receiving donated blood (transfusions).  Low red blood cell count (anemia).  Long-term kidney failure.  Some unusual forms (variants) of hemoglobin. Talk with your health care provider about what your results mean. Questions to ask your health care provider Ask your health care provider, or the department that is doing the test:  When will my results be ready?  How will I get my results?  What are my treatment options?  What other tests do I need?  What are my next steps? Summary  The hemoglobin A1c test (HbA1c test) may be done to evaluate your risk for developing diabetes, to diagnose diabetes, and to monitor long-term control of blood sugar (glucose) in people who have diabetes and help make treatment decisions.  Hemoglobin is a type of protein in the blood that carries oxygen. Glucose attaches to hemoglobin to form glycated hemoglobin. This test checks the amount of glycated hemoglobin in your blood, which is a good indicator of the average amount of glucose in your blood during the past 2-3 months.  Talk with your health care provider about what your results mean. This information is not intended to replace  advice given to you by your health care provider. Make sure you discuss any questions you have with your health care provider. Document Revised: 08/12/2017 Document Reviewed: 04/12/2017 Elsevier Patient Education  [place].  Hyperglycemia Hyperglycemia occurs when the level of sugar (glucose) in the blood is too high. Glucose is a type of sugar that provides the body's main source of energy. Certain hormones (insulin and glucagon) control the level of glucose in the blood. Insulin lowers blood glucose, and glucagon increases blood glucose. Hyperglycemia can result from having too little insulin in the bloodstream, or from the body not responding normally to insulin. Hyperglycemia occurs most often in people who have diabetes (diabetes mellitus), but it can happen in people who do not have diabetes. It can develop quickly, and it can be life-threatening if it causes you to become severely dehydrated (diabetic ketoacidosis or hyperglycemic hyperosmolar state). Severe hyperglycemia is a medical emergency. What are the causes? If you have diabetes, hyperglycemia may be caused by:  Diabetes medicine.  Medicines that increase blood glucose or affect your diabetes control.  Not eating enough, or not eating often enough.  Changes in physical activity level.  Being sick or having an infection. If you have prediabetes or undiagnosed diabetes:  Hyperglycemia may be caused by those conditions. If you do not have diabetes, hyperglycemia may be caused by:  Certain medicines, including steroid medicines, beta-blockers, epinephrine, and thiazide diuretics.  Stress.  Serious illness.  Surgery.  Diseases of the pancreas.  Infection. What increases the risk? Hyperglycemia is more likely to develop in people who have risk factors for diabetes, such as:  Having a family member with diabetes.  Having a gene for type 1 diabetes that is passed from parent to child (inherited).  Living in  an area with cold weather conditions.  Exposure to certain viruses.  Certain conditions in which the body's disease-fighting (immune) system attacks itself (autoimmune disorders).  Being overweight or obese.  Having an inactive (sedentary) lifestyle.  Having been diagnosed with insulin resistance.  Having a history of prediabetes, gestational diabetes, or polycystic ovarian syndrome (PCOS).  Being of American-Indian, African-American, Hispanic/Latino, or Asian/Pacific Islander descent. What are the signs or symptoms? Hyperglycemia may not cause any symptoms. If you do have symptoms, they may include early warning signs, such as:  Increased thirst.  Hunger.  Feeling very tired.  Needing to urinate more often than usual.  Blurry vision. Other symptoms may develop if hyperglycemia gets worse, such as:  Dry mouth.  Loss of appetite.  Fruity-smelling breath.  Weakness.  Unexpected or rapid weight gain or weight loss.  Tingling or numbness in the hands or feet.  Headache.  Skin that does not quickly return to normal after being lightly pinched and released (poor skin turgor).  Abdominal pain.  Cuts or bruises that are slow to heal. How is this diagnosed? Hyperglycemia is diagnosed with a blood test to measure your blood glucose level. This blood test is usually done while you are having symptoms. Your health care provider may also do a physical exam and review your medical history. You may have more tests to determine the cause of your hyperglycemia, such as:  A fasting blood glucose (FBG) test. You will not be allowed to eat (you will fast) for at least 8 hours before a blood sample is taken.  An A1c (hemoglobin A1c) blood test. This provides information about blood glucose control over the previous 2-3 months.  An oral glucose tolerance test (OGTT). This measures your blood glucose at two times: ? After fasting. This is your baseline blood glucose level. ? Two  hours after drinking a beverage that contains glucose. How is this treated? Treatment depends on the cause of your hyperglycemia. Treatment may include:  Taking medicine to regulate your blood glucose levels. If you take insulin or other diabetes medicines, your medicine or dosage may be adjusted.  Lifestyle changes, such as exercising more, eating healthier foods, or losing weight.  Treating an illness or infection, if this caused your hyperglycemia.  Checking your blood glucose more often.  Stopping or reducing steroid medicines, if these caused your hyperglycemia. If your hyperglycemia becomes severe and it results in hyperglycemic hyperosmolar state, you must be hospitalized and given IV fluids. Follow these instructions at home:  General instructions  Take over-the-counter and prescription medicines only as told by your health care provider.  Do not use any products that contain nicotine or tobacco, such as cigarettes and e-cigarettes. If you need help quitting, ask your health care provider.  Limit alcohol intake to no more than 1 drink per day for nonpregnant women and 2 drinks per day for men. One drink equals 12 oz of beer, 5 oz of wine, or 1 oz of hard liquor.  Learn to manage stress. If you need help with this, ask your health care provider.  Keep all follow-up visits as told by your health care provider. This is important. Eating and drinking   Maintain a healthy weight.  Exercise regularly, as directed by your health care provider.  Stay hydrated, especially when you exercise, get sick, or spend time in hot temperatures.  Eat healthy foods, such as: ? Lean proteins. ? Complex carbohydrates. ? Fresh fruits and vegetables. ? Low-fat dairy products. ? Healthy fats.  Drink enough fluid to keep your urine clear or pale yellow. If you have diabetes:  Make sure you know the symptoms of hyperglycemia.  Follow your diabetes management plan, as told by your health  care provider. Make sure you: ? Take your insulin and medicines as directed. ? Follow your exercise plan. ? Follow your meal plan. Eat on time, and do not skip meals. ? Check your blood glucose as often as directed. Make sure to check your blood glucose before and after exercise. If you exercise longer  or in a different way than usual, check your blood glucose more often. ? Follow your sick day plan whenever you cannot eat or drink normally. Make this plan in advance with your health care provider.  Share your diabetes management plan with people in your workplace, school, and household.  Check your urine for ketones when you are ill and as told by your health care provider.  Carry a medical alert card or wear medical alert jewelry. Contact a health care provider if:  Your blood glucose is at or above 240 mg/dL (13.3 mmol/L) for 2 days in a row.  You have problems keeping your blood glucose in your target range.  You have frequent episodes of hyperglycemia. Get help right away if:  You have difficulty breathing.  You have a change in how you think, feel, or act (mental status).  You have nausea or vomiting that does not go away. These symptoms may represent a serious problem that is an emergency. Do not wait to see if the symptoms will go away. Get medical help right away. Call your local emergency services (911 in the [place] Do not drive yourself to the hospital. Summary  Hyperglycemia occurs when the level of sugar (glucose) in the blood is too high.  Hyperglycemia is diagnosed with a blood test to measure your blood glucose level. This blood test is usually done while you are having symptoms. Your health care provider may also do a physical exam and review your medical history.  If you have diabetes, follow your diabetes management plan as told by your health care provider.  Contact your health care provider if you have problems keeping your blood glucose in your target  range. This information is not intended to replace advice given to you by your health care provider. Make sure you discuss any questions you have with your health care provider. Document Revised: 05/17/2016 Document Reviewed: 05/17/2016 Elsevier Patient Education  [place]  This sheet gives you information about how to care for yourself after your procedure. Your health care provider may also give you more specific instructions. If you have problems or questions, contact your health care provider. What can I expect after the procedure? After the procedure, it is common to have:  Bruising and tenderness at the catheter insertion area. Follow these instructions at home: Medicines  Take over-the-counter and prescription medicines only as told by your health care provider. Insertion site care  Follow instructions from your health care provider about how to take care of your insertion site. Make sure you: ? Wash your hands with soap and water before you change your bandage (dressing). If soap and water are not available, use hand sanitizer. ? Change your dressing as told by your health care provider. ? Leave stitches (sutures), skin glue, or adhesive strips in place. These skin closures may need to stay in place for 2 weeks or longer. If adhesive strip edges start to loosen and curl up, you may trim the loose edges. Do not remove adhesive strips completely unless your health care provider tells you to do that.  Check your insertion site every day for signs of infection. Check for: ? Redness, swelling, or pain. ? Fluid or blood. ? Pus or a bad smell. ? Warmth.  Do not take baths, swim, or use a hot tub until your health care provider approves.  You may shower 24-48 hours after the procedure, or as directed by your health care provider. ?  Remove the dressing and gently wash the site with plain soap and water. ? Pat the area dry with a clean towel. ? Do not  rub the site. That could cause bleeding.  Do not apply powder or lotion to the site. Activity   For 24 hours after the procedure, or as directed by your health care provider: ? Do not flex or bend the affected arm. ? Do not push or pull heavy objects with the affected arm. ? Do not drive yourself home from the hospital or clinic. You may drive 24 hours after the procedure unless your health care provider tells you not to. ? Do not operate machinery or power tools.  Do not lift anything that is heavier than 10 lb (4.5 kg), or the limit that you are told, until your health care provider says that it is safe.  Ask your health care provider when it is okay to: ? Return to work or school. ? Resume usual physical activities or sports. ? Resume sexual activity. General instructions  If the catheter site starts to bleed, raise your arm and put firm pressure on the site. If the bleeding does not stop, get help right away. This is a medical emergency.  If you went home on the same day as your procedure, a responsible adult should be with you for the first 24 hours after you arrive home.  Keep all follow-up visits as told by your health care provider. This is important. Contact a health care provider if:  You have a fever.  You have redness, swelling, or yellow drainage around your insertion site. Get help right away if:  You have unusual pain at the radial site.  The catheter insertion area swells very fast.  The insertion area is bleeding, and the bleeding does not stop when you hold steady pressure on the area.  Your arm or hand becomes pale, cool, tingly, or numb. These symptoms may represent a serious problem that is an emergency. Do not wait to see if the symptoms will go away. Get medical help right away. Call your local emergency services (911 in the [place] Do not drive yourself to the hospital. Summary  After the procedure, it is common to have bruising and tenderness at the  site.  Follow instructions from your health care provider about how to take care of your radial site wound. Check the wound every day for signs of infection.  Do not lift anything that is heavier than 10 lb (4.5 kg), or the limit that you are told, until your health care provider says that it is safe. This information is not intended to replace advice given to you by your health care provider. Make sure you discuss any questions you have with your health care provider. Document Revised: 10/05/2017 Document Reviewed: 10/05/2017 Elsevier Patient Education  2020 [place].   Femoral Site Care This sheet gives you information about how to care for yourself after your procedure. Your health care provider may also give you more specific instructions. If you have problems or questions, contact your health care provider. What can I expect after the procedure? After the procedure, it is common to have:  Bruising that usually fades within 1-2 weeks.  Tenderness at the site. Follow these instructions at home: Wound care  Follow instructions from your health care provider about how to take care of your insertion site. Make sure you: ? Wash your hands with soap and water before you change your bandage (dressing). If soap  and water are not available, use hand sanitizer. ? Change your dressing as told by your health care provider. ? Leave stitches (sutures), skin glue, or adhesive strips in place. These skin closures may need to stay in place for 2 weeks or longer. If adhesive strip edges start to loosen and curl up, you may trim the loose edges. Do not remove adhesive strips completely unless your health care provider tells you to do that.  Do not take baths, swim, or use a hot tub until your health care provider approves.  You may shower 24-48 hours after the procedure or as told by your health care provider. ? Gently wash the site with plain soap and water. ? Pat the area dry with a clean  towel. ? Do not rub the site. This may cause bleeding.  Do not apply powder or lotion to the site. Keep the site clean and dry.  Check your femoral site every day for signs of infection. Check for: ? Redness, swelling, or pain. ? Fluid or blood. ? Warmth. ? Pus or a bad smell. Activity  For the first 2-3 days after your procedure, or as long as directed: ? Avoid climbing stairs as much as possible. ? Do not squat.  Do not lift anything that is heavier than 10 lb (4.5 kg), or the limit that you are told, until your health care provider says that it is safe.  Rest as directed. ? Avoid sitting for a long time without moving. Get up to take short walks every 1-2 hours.  Do not drive for 24 hours if you were given a medicine to help you relax (sedative). General instructions  Take over-the-counter and prescription medicines only as told by your health care provider.  Keep all follow-up visits as told by your health care provider. This is important. Contact a health care provider if you have:  A fever or chills.  You have redness, swelling, or pain around your insertion site. Get help right away if:  The catheter insertion area swells very fast.  You pass out.  You suddenly start to sweat or your skin gets clammy.  The catheter insertion area is bleeding, and the bleeding does not stop when you hold steady pressure on the area.  The area near or just beyond the catheter insertion site becomes pale, cool, tingly, or numb. These symptoms may represent a serious problem that is an emergency. Do not wait to see if the symptoms will go away. Get medical help right away. Call your local emergency services (911 in the [place] Do not drive yourself to the hospital. Summary  After the procedure, it is common to have bruising that usually fades within 1-2 weeks.  Check your femoral site every day for signs of infection.  Do not lift anything that is heavier than 10 lb (4.5 kg), or  the limit that you are told, until your health care provider says that it is safe. This information is not intended to replace advice given to you by your health care provider. Make sure you discuss any questions you have with your health care provider. Document Revised: 09/12/2017 Document Reviewed: 09/12/2017 Elsevier Patient Education  2020 [facility] [phone]) 1. Dr. Delrae Rend [facility] [phone]) 1. Dr. Jacelyn Pi 2. Dr. Anda Kraft [facility] [phone]) 1. Dr. Daneil Dolin [facility] [phone]) [[place] office]  [phone]) [[place] office] 1. Dr. Lenna Sciara [place] 2. Dr. Mee Hives [facility]) [phone]) 1. Autumn Hudnall Ronnald Ramp), PA 2. Dr. Amalia Greenhouse 3. Dr. Marsh Dolly. [facility] [phone]) 1. Dr. Glade Lloyd [facility] [phone]) 1. Dr. Orville Govern 2. Dr. Lelon Huh 3. Dr. Jerelene Redden 4. Alwyn Ren, FNP Dr. Carolynn Serve. [place] [phone])

## 2020-01-12 NOTE — Discharge Summary (Signed)
Physician Discharge Summary  KINLYNN WIATROWSKI [id number] DOB: 1946/05/18 DOA: 01/08/2020  PCP: Haywood Pao, MD  Admit date: 01/08/2020 Discharge date: 01/12/2020  Admitted From: Home Disposition: Home  Recommendations for Outpatient Follow-up:  1. Follow up with PCP in 1-2 weeks 2. Please obtain BMP/CBC in one week 3. Please follow up on the following pending results:  Home Health: Yes Equipment/Devices: Rolling walker  Discharge Condition: Stable CODE STATUS: Full Diet recommendation: Diabetic diet, low-salt, low-fat  Brief/Interim Summary: Laurie [name] a 74 y.o.femalewith medical history significant forCAD, obstructive with four-vessel CABG in 2000, status post AICD placement, chronic systolic heart failure, EF 45% March 2019,unspecified hepatitis, hyperlipidemia, multinodular goiter, obstructive sleep apnea on CPAP since 2018, peripheral neuropathy, TIA, insulin-dependent type 2 diabetes with questionable history of dementia who presents after being found down per report in the ED. Patient is a very poor historian given her current state with questionable history of dementia. Majority of history comes from chart review, physician in the ED and family. Per husband patient has been somewhat altered over the past 3 to 4 days with worsening memory but no formal diagnosis of dementia, she had very poor p.o. intake but otherwise no complaints other than "sciatic hip pain" and that she is reported no nausea vomiting diarrhea constipation headache fevers or chills to the best of his knowledge,again the patient has been a poor historian for him as well over the past few days. This morning at approximately 3 AM the patient's husband awoke to loud noise and found the patient in the living room pinned against a wall after what appears to have been a fall near the couch, to him it appears that she had struck her head on the wall as she was laying in that direction. Patient  declines any falls recently. Patient's husband also indicates she has had very poor p.o. intake over the past few days to week again without clear etiology. Denies any new medications, changes in medications or diet other than decreased intake. Patient was noted to be hypoxic into the 70s per EMS upon arrival. Placed on nonrebreather and brought to the ED. In the ED patient had notable hyperkalemia, elevated troponin, elevated BNP and minimally elevated CK -received Lasix IV 40x1. Chest x-ray remarkable for large right-sided opacification, EKG notable for ventricularly paced tachycardia. Cardiology was consulted by the ED for elevated troponin. Hospitalist was called to admit patient for further evaluation treatment of other comorbid conditions  Patient admitted as above with acute hypoxic respiratory failure in the setting of sepsis community-acquired pneumonia versus aspiration pneumonia found to have a markedly elevated troponin initially thought to be supply demand mismatch in the setting of hypoxia but given uptrending troponin cardiology was consulted.  Given her history of CABG with multiple stent placements patient underwent left heart cath requiring balloon angioplasty x2.  Echo was remarkable for reduced EF at 30 to 35%, patient transitioned from ACE inhibitor to [facility] as well as other core measures including metoprolol.  Patient's mental status has been somewhat waxing and waning likely in the setting of hospital delirium/sundowning, patient's husband indicates that she has episodes at home as well and usually does better at home.  As such given cardiology has medically manage patient quite aggressively at this time and patient appears quite well from a cardiac standpoint her hypoxia is resolved and she is weaning off of her antibiotics we will transition her to p.o. antibiotics and discharge home with home health and PT OT.  Her insulin  appears to be very poorly controlled, neither her nor  her husband are quite certain on which insulin or how much insulin she is on daily.  We will transition patient to Lantus 20 units at bedtime and will need to call her PCP for close follow-up in the next few days to week.  Patient otherwise stable and both she and her husband are agreeable to discharge home with close follow-up as outlined above.   Discharge Diagnoses:  Active Problems:   Multinodular goiter   Hypertensive heart disease   CAD (coronary artery disease)   Hyperlipidemia   Sleep apnea   NSTEMI (non-ST elevated myocardial infarction) (HCC)   Biventricular automatic implantable cardioverter defibrillator in situ   S/P CABG (coronary artery bypass graft)   Chronic systolic CHF (congestive heart failure) ([place])   Type 2 diabetes mellitus with diabetic neuropathy, unspecified ([place])   Acute respiratory failure with hypoxia (HCC)   Elevated troponin    Discharge Instructions  Discharge Instructions    (HEART FAILURE PATIENTS) Call MD:  Anytime you have any of the following symptoms: 1) 3 pound weight gain in 24 hours or 5 pounds in 1 week 2) shortness of breath, with or without a dry hacking cough 3) swelling in the hands, feet or stomach 4) if you have to sleep on extra pillows at night in order to breathe.   Complete by: As directed    Amb Referral to Cardiac Rehabilitation   Complete by: As directed    Diagnosis:  PTCA NSTEMI     After initial evaluation and assessments completed: Virtual Based Care may be provided alone or in conjunction with Phase 2 Cardiac Rehab based on patient barriers.: Yes   Call MD for:  difficulty breathing, headache or visual disturbances   Complete by: As directed    Call MD for:  extreme fatigue   Complete by: As directed    Call MD for:  hives   Complete by: As directed    Call MD for:  persistant dizziness or light-headedness   Complete by: As directed    Call MD for:  persistant nausea and vomiting   Complete by: As directed    Call MD  for:  redness, tenderness, or signs of infection (pain, swelling, redness, odor or green/yellow discharge around incision site)   Complete by: As directed    Call MD for:  severe uncontrolled pain   Complete by: As directed    Call MD for:  temperature >100.4   Complete by: As directed    Diet - low sodium heart healthy   Complete by: As directed    Increase activity slowly   Complete by: As directed      Allergies as of 01/12/2020      Reactions   Brilinta [ticagrelor] Shortness Of Breath   Statins Other (See Comments)   Statins make the legs ache   Tricor [fenofibrate] Other (See Comments)   Leg pain   Latex Itching, Other (See Comments)   Affected areas turn red, also-   Tape Rash   Heart leads break out the skin      Medication List    STOP taking these medications   lisinopril 20 MG tablet Commonly known as: ZESTRIL   pravastatin 40 MG tablet Commonly known as: PRAVACHOL   Toujeo Max SoloStar 300 UNIT/ML Solostar Pen Generic drug: insulin glargine (2 Unit Dial) Replaced by: insulin glargine 100 UNIT/ML injection     TAKE these medications   Accu-Chek FastClix  Lancets Misc USE  TO CHECK BLOOD SUGAR FOUR TIMES DAILY What changed: See the new instructions.   Accu-Chek SmartView Control Liqd Use to calibrate blood glucose meter.   Accu-Chek SmartView test strip Generic drug: glucose blood USE  TO CHECK BLOOD SUGAR FOUR TIMES DAILY What changed: See the new instructions.   aspirin EC 81 MG tablet Take 1 tablet (81 mg total) by mouth daily.   azithromycin 250 MG tablet Commonly known as: Zithromax Take 1 tablet (250 mg total) by mouth daily for 2 days. Take 1 tablet daily for 3 days.   B-D SINGLE USE SWABS REGULAR Pads Use 8 daily for testing blood sugar and insulin injections.   BD Veo Insulin Syringe U/F 31G X 15/64" 1 ML Misc Generic drug: Insulin Syringe-Needle U-100 USE 10 TIMES A DAY WITH INSULIN AS DIRECTED   beta carotene w/minerals tablet Take  1 tablet by mouth daily.   cephALEXin 250 MG capsule Commonly known as: KEFLEX Take 1 capsule (250 mg total) by mouth 4 (four) times daily for 2 days.   cetirizine 10 MG tablet Commonly known as: ZYRTEC Take 1 tablet (10 mg total) by mouth daily.   cholecalciferol 1000 units tablet Commonly known as: VITAMIN D Take 1,000 Units by mouth daily.   clopidogrel 75 MG tablet Commonly known as: PLAVIX Take 1 tablet (75 mg total) by mouth daily.   FreeStyle Libre 14 Day Sensor Misc APPLY EVERY 14 DAYS   furosemide 40 MG tablet Commonly known as: LASIX TAKE 2 TABLETS BY MOUTH EVERY MORNING AND 1 TABLET EVERY EVENING. TAKE 1 ADDITIONAL TABLET FOR WEIGHT GAIN OF 3LBS OR MORE What changed:   how much to take  how to take this  when to take this   insulin glargine 100 UNIT/ML injection Commonly known as: LANTUS Inject 0.2 mLs (20 Units total) into the skin at bedtime. Replaces: Toujeo Max SoloStar 300 UNIT/ML Solostar Pen   Insulin Pen Needle 32G X 4 MM Misc Commonly known as: BD Pen Needle Nano U/F 1 each by Does not apply route 3 (three) times daily. Use to inject insulin TID; E11.40   ketoconazole 2 % cream Commonly known as: NIZORAL Apply 1 application topically daily as needed for irritation. TO AFFECTED AREAS OF FACE   metoprolol succinate 25 MG 24 hr tablet Commonly known as: TOPROL-XL Take 3 tablets (75 mg total) by mouth daily. Take with or immediately following a meal. Start taking on: Jan 13, 2020 What changed:   medication strength  how much to take   nitroGLYCERIN 0.4 MG SL tablet Commonly known as: NITROSTAT Place 0.4 mg under the tongue every 5 (five) minutes as needed for chest pain.   potassium chloride SA 20 MEQ tablet Commonly known as: Klor-Con M20 Take 1 tablet (20 mEq total) by mouth daily.   Probiotic Daily Caps Take 1 capsule by mouth daily.   psyllium 58.6 % powder Commonly known as: METAMUCIL Take 1 packet by mouth daily as needed  (fiber).   rosuvastatin 40 MG tablet Commonly known as: CRESTOR Take 1 tablet (40 mg total) by mouth daily. Start taking on: Jan 13, 2020   sacubitril-valsartan 24-26 MG Commonly known as: ENTRESTO Take 1 tablet by mouth 2 (two) times daily.   spironolactone 25 MG tablet Commonly known as: ALDACTONE Take 0.5 tablets (12.5 mg total) by mouth daily.   vitamin C 500 MG tablet Commonly known as: ASCORBIC ACID Take 500 mg by mouth every other day.  Follow-up Information    [facility] Follow up.   Specialty: Home Health Services Why: Physical Therapy-Occupational Therapy-office to call with visit times. Services may be delayed until Wednesday/Thursday next week.  Contact information: [address] [place] [address] [phone]          Allergies  Allergen Reactions  . Brilinta [Ticagrelor] Shortness Of Breath  . Statins Other (See Comments)    Statins make the legs ache  . Tricor [Fenofibrate] Other (See Comments)    Leg pain  . Latex Itching and Other (See Comments)    Affected areas turn red, also-  . Tape Rash    Heart leads break out the skin    Consultations: Cardiology   Procedures/Studies: CT HEAD WO CONTRAST  Result Date: 01/08/2020 CLINICAL DATA:  Found on floor this morning. Increasing altered mental status. EXAM: CT HEAD WITHOUT CONTRAST TECHNIQUE: Contiguous axial images were obtained from the base of the skull through the vertex without intravenous contrast. COMPARISON:  Most recent head CT 03/19/2017 FINDINGS: Brain: Generalized cerebral atrophy, stable from prior exam. Moderate periventricular and deep white matter hypodensity consistent with chronic small vessel ischemia, progressed. No intracranial hemorrhage, mass effect, or midline shift. No hydrocephalus. The basilar cisterns are patent. No evidence of territorial infarct or acute ischemia. No extra-axial or intracranial fluid collection. Vascular: Atherosclerosis of skullbase  vasculature without hyperdense vessel or abnormal calcification. Skull: No fracture or focal lesion. Sinuses/Orbits: Paranasal sinuses and mastoid air cells are clear. The visualized orbits are unremarkable. Bilateral cataract resection. Other: None. IMPRESSION: 1. No acute intracranial abnormality. 2. Generalized cerebral atrophy. Moderate chronic small vessel ischemia, progressed from 2018 exam. Electronically Signed   By: Keith Rake M.D.   On: 01/08/2020 18:48   CARDIAC CATHETERIZATION  Result Date: 01/09/2020  Mid RCA lesion is 100% stenosed.  Ost RCA to Prox RCA lesion is 50% stenosed.  Previously placed Prox Graft stent (unknown type) is widely patent.  Mid Graft lesion is 65% stenosed.  Origin lesion is 100% stenosed.  LIMA and is normal in caliber.  The graft exhibits no disease.  Mid LAD to Dist LAD lesion is 80% stenosed.  Origin to Prox Graft lesion is 95% stenosed.  Post intervention, there is a 10% residual stenosis.  Balloon angioplasty was performed using a BALLOON  EMERGE MR 3.0X20.  Dist LM lesion is 80% stenosed.  Ost Cx to Prox Cx lesion is 100% stenosed.  Ost LAD to Prox LAD lesion is 100% stenosed.  1.  Severe underlying three-vessel coronary artery disease with occluded native vessels. 2.  Known chronically occluded SVG to right PDA.  LIMA to LAD is patent.  However, there is chronic diffuse disease in the mid and distal LAD.  SVG to diagonal is patent with no significant in-stent restenosis.  However, there is a borderline 60 to 70% lesion after the stent.  SVG to OM is patent with severe 95% in-stent restenosis proximally which is the likely culprit for myocardial infarction. 3.  Left ventricular angiography was not performed.  EF was 30 to 35% by echo.  Mildly elevated left ventricular end-diastolic pressure at 16 mmHg. 4.  Successful balloon angioplasty of SVG to OM for severe in-stent restenosis. Recommendations: This was a very difficult and prolonged procedure  from the left radial artery given inability to engage the SVG to OM.  There is significant tortuosity in the left subclavian artery.  I had to switch to a femoral approach. Avoid left radial catheterization  in the future. Continue dual antiplatelet therapy indefinitely if possible. Aggressive treatment of risk factors. The lesion in the SVG to diagonal can likely be monitored for now.  PCI can be considered if she has residual angina.   DG Chest Port 1 View  Result Date: 01/08/2020 CLINICAL DATA:  Dyspnea. EXAM: PORTABLE CHEST 1 VIEW COMPARISON:  October 29, 2017. FINDINGS: Stable cardiomediastinal silhouette. Status post coronary bypass graft. Left-sided pacemaker is unchanged in position. No pneumothorax or pleural effusion is noted. Left lung is clear. New large diffuse right lung opacity is noted most consistent with pneumonia. Bony thorax is unremarkable. IMPRESSION: New large diffuse right lung opacity is noted most consistent with pneumonia. Electronically Signed   By: Marijo Conception M.D.   On: 01/08/2020 13:55   ECHOCARDIOGRAM COMPLETE  Result Date: 01/09/2020    ECHOCARDIOGRAM REPORT   Patient Name:   VIRDIA SABATER Date of Exam: 01/09/2020 Medical Rec #:  [id number]        Height:       61.0 in Accession #:    [id number]       Weight:       180.0 lb Date of Birth:  1946/07/14        BSA:          1.806 m Patient Age:    74 years         BP:           115/71 mmHg Patient Gender: F                HR:           91 bpm. Exam Location:  Inpatient Procedure: 2D Echo Indications:    Dyspnea 786.09 / R06.00  History:        Patient has prior history of Echocardiogram examinations, most                 recent 11/24/2017. CAD, S/P CABG (coronary artery bypass graft);                 Risk Factors:Diabetes, Sleep Apnea, Hypertension and                 Dyslipidemia. Biventricular automatic implantable cardioverter                 defibrillator.  Sonographer:    Vikki Ports Turrentine Referring Phys: [id number] HAO  MENG IMPRESSIONS  1. Left ventricular ejection fraction, by estimation, is 30 to 35%. The left ventricle has moderately decreased function. The left ventricle has no regional wall motion abnormalities. The left ventricular internal cavity size was mildly dilated. Left ventricular diastolic parameters are consistent with Grade II diastolic dysfunction (pseudonormalization). Elevated left ventricular end-diastolic pressure. There is akinesis of the left ventricular, entire inferior wall, inferolateral wall and apical segment.  2. Right ventricular systolic function is normal. The right ventricular size is normal. There is normal pulmonary artery systolic pressure.  3. Left atrial size was mildly dilated.  4. The mitral valve is normal in structure. Mild mitral valve regurgitation. No evidence of mitral stenosis.  5. The aortic valve is tricuspid. Aortic valve regurgitation is not visualized. Mild aortic valve sclerosis is present, with no evidence of aortic valve stenosis.  6. The inferior vena cava is normal in size with greater than 50% respiratory variability, suggesting right atrial pressure of 3 mmHg. FINDINGS  Left Ventricle: Left ventricular ejection fraction, by estimation, is 30 to 35%. The left ventricle has moderately decreased  function. The left ventricle has no regional wall motion abnormalities. The left ventricular internal cavity size was mildly dilated. There is no left ventricular hypertrophy. Left ventricular diastolic parameters are consistent with Grade II diastolic dysfunction (pseudonormalization). Elevated left ventricular end-diastolic pressure. Right Ventricle: The right ventricular size is normal. No increase in right ventricular wall thickness. Right ventricular systolic function is normal. There is normal pulmonary artery systolic pressure. The tricuspid regurgitant velocity is 2.64 m/s, and  with an assumed right atrial pressure of 3 mmHg, the estimated right ventricular systolic pressure  is 123456 mmHg. Left Atrium: Left atrial size was mildly dilated. Right Atrium: Right atrial size was normal in size. Pericardium: There is no evidence of pericardial effusion. Mitral Valve: The mitral valve is normal in structure. There is mild calcification of the anterior mitral valve leaflet(s). Normal mobility of the mitral valve leaflets. Mild mitral annular calcification. Mild mitral valve regurgitation. No evidence of mitral valve stenosis. Tricuspid Valve: The tricuspid valve is normal in structure. Tricuspid valve regurgitation is mild . No evidence of tricuspid stenosis. Aortic Valve: The aortic valve is tricuspid. Aortic valve regurgitation is not visualized. Mild aortic valve sclerosis is present, with no evidence of aortic valve stenosis. Aortic valve mean gradient measures 4.0 mmHg. Aortic valve peak gradient measures 7.2 mmHg. Aortic valve area, by VTI measures 1.93 cm. Pulmonic Valve: The pulmonic valve was normal in structure. Pulmonic valve regurgitation is mild. No evidence of pulmonic stenosis. Aorta: The aortic root is normal in size and structure. Venous: The inferior vena cava is normal in size with greater than 50% respiratory variability, suggesting right atrial pressure of 3 mmHg. IAS/Shunts: No atrial level shunt detected by color flow Doppler. Additional Comments: A pacer wire is visualized.  LEFT VENTRICLE PLAX 2D LVIDd:         5.40 cm  Diastology LVIDs:         4.52 cm  LV e' lateral:   5.92 cm/s LV PW:         1.00 cm  LV E/e' lateral: 14.4 LV IVS:        1.00 cm  LV e' medial:    5.27 cm/s LVOT diam:     1.80 cm  LV E/e' medial:  16.1 LV SV:         46 LV SV Index:   26 LVOT Area:     2.54 cm  RIGHT VENTRICLE RV S prime:     7.70 cm/s TAPSE (M-mode): 1.3 cm LEFT ATRIUM             Index       RIGHT ATRIUM           Index LA diam:        4.60 cm 2.55 cm/m  RA Area:     15.40 cm LA Vol (A2C):   72.8 ml 40.31 ml/m RA Volume:   39.20 ml  21.70 ml/m LA Vol (A4C):   64.5 ml 35.71  ml/m LA Biplane Vol: 70.0 ml 38.76 ml/m  AORTIC VALVE AV Area (Vmax):    2.01 cm AV Area (Vmean):   1.82 cm AV Area (VTI):     1.93 cm AV Vmax:           134.00 cm/s AV Vmean:          101.000 cm/s AV VTI:            0.240 m AV Peak Grad:      7.2 mmHg AV Mean  Grad:      4.0 mmHg LVOT Vmax:         106.00 cm/s LVOT Vmean:        72.200 cm/s LVOT VTI:          0.182 m LVOT/AV VTI ratio: 0.76  AORTA Ao Root diam: 2.80 cm MITRAL VALVE                TRICUSPID VALVE MV Area (PHT): 3.72 cm     TR Peak grad:   27.9 mmHg MV Decel Time: 204 msec     TR Vmax:        264.00 cm/s MV E velocity: 85.10 cm/s MV A velocity: 112.00 cm/s  SHUNTS MV E/A ratio:  0.76         Systemic VTI:  0.18 m                             Systemic Diam: 1.80 cm Fransico Him MD Electronically signed by Fransico Him MD Signature Date/Time: 01/09/2020/1:26:09 PM    Final      Subjective: No acute issues or events overnight, denies chest pain, shortness of breath, nausea, vomiting, diarrhea, constipation, headache, fevers, chills.   Discharge Exam: Vitals:   01/12/20 0349 01/12/20 0949  BP: 126/70 130/76  Pulse: 72 82  Resp: 14   Temp: 98.9 F (37.2 C)   SpO2: 92%    Vitals:   01/11/20 1501 01/11/20 2027 01/12/20 0349 01/12/20 0949  BP: 112/61 (!) 121/53 126/70 130/76  Pulse: 71 74 72 82  Resp:  (!) 22 14   Temp: 99.4 F (37.4 C) 98.6 F (37 C) 98.9 F (37.2 C)   TempSrc: Oral Oral Oral   SpO2: 93% 95% 92%   Weight:   84 kg   Height:        General: Pt is alert, awake, not in acute distress Cardiovascular: RRR, S1/S2 +, no rubs, no gallops Respiratory: CTA bilaterally, no wheezing, no rhonchi Abdominal: Soft, NT, ND, bowel sounds + Extremities: no edema, no cyanosis    The results of significant diagnostics from this hospitalization (including imaging, microbiology, ancillary and laboratory) are listed below for reference.     Microbiology: Recent Results (from the past 240 hour(s))  Respiratory Panel by  RT PCR (Flu A&B, Covid) - Nasopharyngeal Swab     Status: None   Collection Time: 01/08/20 12:40 PM   Specimen: Nasopharyngeal Swab  Result Value Ref Range Status   SARS Coronavirus 2 by RT PCR NEGATIVE NEGATIVE Final    Comment: (NOTE) SARS-CoV-2 target nucleic acids are NOT DETECTED. The SARS-CoV-2 RNA is generally detectable in upper respiratoy specimens during the acute phase of infection. The lowest concentration of SARS-CoV-2 viral copies this assay can detect is 131 copies/mL. A negative result does not preclude SARS-Cov-2 infection and should not be used as the sole basis for treatment or other patient management decisions. A negative result may occur with  improper specimen collection/handling, submission of specimen other than nasopharyngeal swab, presence of viral mutation(s) within the areas targeted by this assay, and inadequate number of viral copies (<131 copies/mL). A negative result must be combined with clinical observations, patient history, and epidemiological information. The expected result is Negative. Fact Sheet for Patients:  PinkCheek.be Fact Sheet for Healthcare Providers:  GravelBags.it This test is not yet ap proved or cleared by the [place] FDA and  has been authorized for detection and/or diagnosis of  SARS-CoV-2 by FDA under an Emergency Use Authorization (EUA). This EUA will remain  in effect (meaning this test can be used) for the duration of the COVID-19 declaration under Section 564(b)(1) of the Act, 21 U.S.C. section 360bbb-3(b)(1), unless the authorization is terminated or revoked sooner.    Influenza A by PCR NEGATIVE NEGATIVE Final   Influenza B by PCR NEGATIVE NEGATIVE Final    Comment: (NOTE) The Xpert Xpress SARS-CoV-2/FLU/RSV assay is intended as an aid in  the diagnosis of influenza from Nasopharyngeal swab specimens and  should not be used as a sole basis for treatment.  Nasal washings and  aspirates are unacceptable for Xpert Xpress SARS-CoV-2/FLU/RSV  testing. Fact Sheet for Patients: PinkCheek.be Fact Sheet for Healthcare Providers: GravelBags.it This test is not yet approved or cleared by the [place] FDA and  has been authorized for detection and/or diagnosis of SARS-CoV-2 by  FDA under an Emergency Use Authorization (EUA). This EUA will remain  in effect (meaning this test can be used) for the duration of the  Covid-19 declaration under Section 564(b)(1) of the Act, 21  U.S.C. section 360bbb-3(b)(1), unless the authorization is  terminated or revoked. Performed at [facility], [place] [address], [place], [place] [address]      Labs: BNP (last 3 results) Recent Labs    01/08/20 1500  BNP [id number]*   Basic Metabolic Panel: Recent Labs  Lab 01/08/20 1241 01/08/20 1241 01/08/20 1253 01/09/20 0045 01/09/20 1844 01/10/20 0307 01/11/20 1230 01/12/20 0252  NA 138   < > 133* 137  --  137 135 136  K 5.5*   < > 7.4* 3.6  --  3.4* 3.7 3.3*  CL 103  --   --  99  --  103 102 103  CO2 18*  --   --  23  --  23 23 24   GLUCOSE 467*  --   --  395*  --  264* 293* 232*  BUN 17  --   --  21  --  24* 22 21  CREATININE 1.00   < >  --  1.02* 1.06* 0.84 0.85 0.92  CALCIUM 9.6  --   --  9.2  --  9.1 8.7* 8.8*   < > = values in this interval not displayed.   Liver Function Tests: Recent Labs  Lab 01/08/20 1241 01/09/20 0045 01/11/20 1230 01/12/20 0252  AST 66* 153* 24 19  ALT 28 33 22 20  ALKPHOS 69 50 49 46  BILITOT 1.5* 0.5 0.7 0.5  PROT 7.3 6.2* 6.1* 6.0*  ALBUMIN 3.6 2.9* 2.6* 2.6*   No results for input(s): LIPASE, AMYLASE in the last 168 hours. No results for input(s): AMMONIA in the last 168 hours. CBC: Recent Labs  Lab 01/08/20 1241 01/08/20 1241 01/08/20 1253 01/09/20 0045 01/09/20 1844 01/11/20 1230 01/12/20 0252  WBC 12.5*  --   --  11.2* 9.0 7.5 7.2   NEUTROABS 11.1*  --   --   --   --   --   --   HGB 16.5*   < > 17.0* 14.6 13.1 13.5 13.5  HCT 48.6*   < > 50.0* 42.5 39.2 40.4 40.6  MCV 90.7  --   --  89.9 92.9 91.6 92.3  PLT 237  --   --  195 169 170 185   < > = values in this interval not displayed.   Cardiac Enzymes: Recent Labs  Lab 01/08/20 1241  CKTOTAL 421*  BNP: Invalid input(s): POCBNP CBG: Recent Labs  Lab 01/11/20 0830 01/11/20 1127 01/11/20 1713 01/11/20 2111 01/12/20 0755  GLUCAP 236* 293* 246* 267* 204*   D-Dimer No results for input(s): DDIMER in the last 72 hours. Hgb A1c No results for input(s): HGBA1C in the last 72 hours. Lipid Profile Recent Labs    01/11/20 0332  CHOL 168  HDL 38*  LDLCALC 100*  TRIG 151*  CHOLHDL 4.4   Thyroid function studies No results for input(s): TSH, T4TOTAL, T3FREE, THYROIDAB in the last 72 hours.  Invalid input(s): FREET3 Anemia work up No results for input(s): VITAMINB12, FOLATE, FERRITIN, TIBC, IRON, RETICCTPCT in the last 72 hours. Urinalysis    Component Value Date/Time   COLORURINE YELLOW 10/20/2015 1435   APPEARANCEUR CLOUDY (A) 10/20/2015 1435   LABSPEC 1.010 10/20/2015 1435   PHURINE 5.5 10/20/2015 1435   GLUCOSEU NEGATIVE 10/20/2015 1435   HGBUR LARGE (A) 10/20/2015 1435   HGBUR negative 03/21/2009 0857   BILIRUBINUR NEGATIVE 10/20/2015 1435   BILIRUBINUR neg 06/03/2011 1217   KETONESUR NEGATIVE 10/20/2015 1435   PROTEINUR 30 (A) 10/20/2015 1435   UROBILINOGEN 0.2 06/03/2011 1217   UROBILINOGEN 0.2 03/21/2009 0857   NITRITE NEGATIVE 10/20/2015 1435   LEUKOCYTESUR LARGE (A) 10/20/2015 1435   Sepsis Labs Invalid input(s): PROCALCITONIN,  WBC,  LACTICIDVEN Microbiology Recent Results (from the past 240 hour(s))  Respiratory Panel by RT PCR (Flu A&B, Covid) - Nasopharyngeal Swab     Status: None   Collection Time: 01/08/20 12:40 PM   Specimen: Nasopharyngeal Swab  Result Value Ref Range Status   SARS Coronavirus 2 by RT PCR NEGATIVE  NEGATIVE Final    Comment: (NOTE) SARS-CoV-2 target nucleic acids are NOT DETECTED. The SARS-CoV-2 RNA is generally detectable in upper respiratoy specimens during the acute phase of infection. The lowest concentration of SARS-CoV-2 viral copies this assay can detect is 131 copies/mL. A negative result does not preclude SARS-Cov-2 infection and should not be used as the sole basis for treatment or other patient management decisions. A negative result may occur with  improper specimen collection/handling, submission of specimen other than nasopharyngeal swab, presence of viral mutation(s) within the areas targeted by this assay, and inadequate number of viral copies (<131 copies/mL). A negative result must be combined with clinical observations, patient history, and epidemiological information. The expected result is Negative. Fact Sheet for Patients:  PinkCheek.be Fact Sheet for Healthcare Providers:  GravelBags.it This test is not yet ap proved or cleared by the [place] FDA and  has been authorized for detection and/or diagnosis of SARS-CoV-2 by FDA under an Emergency Use Authorization (EUA). This EUA will remain  in effect (meaning this test can be used) for the duration of the COVID-19 declaration under Section 564(b)(1) of the Act, 21 U.S.C. section 360bbb-3(b)(1), unless the authorization is terminated or revoked sooner.    Influenza A by PCR NEGATIVE NEGATIVE Final   Influenza B by PCR NEGATIVE NEGATIVE Final    Comment: (NOTE) The Xpert Xpress SARS-CoV-2/FLU/RSV assay is intended as an aid in  the diagnosis of influenza from Nasopharyngeal swab specimens and  should not be used as a sole basis for treatment. Nasal washings and  aspirates are unacceptable for Xpert Xpress SARS-CoV-2/FLU/RSV  testing. Fact Sheet for Patients: PinkCheek.be Fact Sheet for Healthcare  Providers: GravelBags.it This test is not yet approved or cleared by the [place] FDA and  has been authorized for detection and/or diagnosis of SARS-CoV-2 by  FDA under an Emergency Use  Authorization (EUA). This EUA will remain  in effect (meaning this test can be used) for the duration of the  Covid-19 declaration under Section 564(b)(1) of the Act, 21  U.S.C. section 360bbb-3(b)(1), unless the authorization is  terminated or revoked. Performed at [facility], [place] [address] [place], [place] [address]      Time coordinating discharge: Over 30 minutes  SIGNED:   Little Ishikawa, DO [facility] 01/12/2020, 2:30 PM Pager   If 7PM-7AM, please contact night-coverage [email or web address]

## 2020-01-12 NOTE — TOC Transition Note (Signed)
Transition of Care [facility]) - CM/SW Discharge Note   Patient Details  Name: Laurie [name] MRN: [id number] Date of Birth: Nov 07, 1945  Transition [facility]) CM/SW Contact:  Bartholomew Crews, RN Phone Number:  [phone] 01/12/2020, 1:07 PM   Clinical Narrative:     Patient to transition home today. Request to MD for [facility] PT/OT orders. Notified liaison at [facility] of discharge today. No further TOC needs identied at this time.   Final next level of care: Home w Home Health Services Barriers to Discharge: No Barriers Identified   Patient Goals and CMS Choice   CMS Medicare.gov Compare Post Acute Care list provided to:: Patient Choice offered to / list presented to : Patient  Discharge Placement                       Discharge Plan and Services In-house Referral: NA Discharge Planning Services: CM Consult Post Acute Care Choice: Home Health                    HH Arranged: PT, OT [place] Agency: [facility] (formerly [employer]) Date [place]: 01/12/20 Time [place]: 1307 Representative spoke with at [place]: [place] ([place]) Interventions     Readmission Risk Interventions No flowsheet data found.

## 2020-01-14 ENCOUNTER — Telehealth: Payer: Self-pay | Admitting: Endocrinology

## 2020-01-14 NOTE — Telephone Encounter (Signed)
Thank you. Please continue efforts to reach pt for an appt

## 2020-01-14 NOTE — Telephone Encounter (Signed)
Patient is being release home from [facility] after heart cath.  Dr Osborne Casco is patients PCP, they called [place] to advise that hospital wanted them to follow patient for diabetes after discharge today and they have called this office for assistance.  Per hospital they are releasing patient home on Lantus.  PCP wanted [place] to try and see patient in the next couple of days as requested by hospitalist for DM.  I tried to call patient's number [phone] (no answer/no VM) and husband Laurie [name] at [phone] answer/No VM)

## 2020-01-16 ENCOUNTER — Telehealth (HOSPITAL_COMMUNITY): Payer: Self-pay

## 2020-01-16 ENCOUNTER — Encounter (HOSPITAL_COMMUNITY): Payer: Self-pay

## 2020-01-16 NOTE — Telephone Encounter (Signed)
Pt insurance is active and benefits verified through [place] $10, DED 0/0 met, out of pocket $3,900/$0 met, co-insurance 0%. no pre-authorization required. Passport, 01/16/2020'@10'$ :21am, REF# [id number]  Will contact patient to see if she is interested in the Cardiac Rehab Program. If interested, patient will need to complete follow up appt. Once completed, patient will be contacted for scheduling upon review by the RN Navigator.

## 2020-01-17 ENCOUNTER — Encounter: Payer: Self-pay | Admitting: Endocrinology

## 2020-01-17 NOTE — Telephone Encounter (Signed)
ATC pt to set up   No Answer/No VM mailed a letter

## 2020-01-17 NOTE — Telephone Encounter (Signed)
I called patients number and spouse number this morning trying to schedule as well

## 2020-01-21 ENCOUNTER — Ambulatory Visit (INDEPENDENT_AMBULATORY_CARE_PROVIDER_SITE_OTHER): Payer: Medicare HMO

## 2020-01-21 ENCOUNTER — Telehealth: Payer: Self-pay

## 2020-01-21 DIAGNOSIS — J189 Pneumonia, unspecified organism: Secondary | ICD-10-CM | POA: Diagnosis not present

## 2020-01-21 DIAGNOSIS — E875 Hyperkalemia: Secondary | ICD-10-CM | POA: Diagnosis not present

## 2020-01-21 DIAGNOSIS — G4733 Obstructive sleep apnea (adult) (pediatric): Secondary | ICD-10-CM | POA: Diagnosis not present

## 2020-01-21 DIAGNOSIS — I5022 Chronic systolic (congestive) heart failure: Secondary | ICD-10-CM

## 2020-01-21 DIAGNOSIS — G9341 Metabolic encephalopathy: Secondary | ICD-10-CM | POA: Diagnosis not present

## 2020-01-21 DIAGNOSIS — I429 Cardiomyopathy, unspecified: Secondary | ICD-10-CM | POA: Insufficient documentation

## 2020-01-21 DIAGNOSIS — Z95810 Presence of automatic (implantable) cardiac defibrillator: Secondary | ICD-10-CM

## 2020-01-21 DIAGNOSIS — I252 Old myocardial infarction: Secondary | ICD-10-CM | POA: Diagnosis not present

## 2020-01-21 DIAGNOSIS — I119 Hypertensive heart disease without heart failure: Secondary | ICD-10-CM | POA: Diagnosis not present

## 2020-01-21 DIAGNOSIS — I509 Heart failure, unspecified: Secondary | ICD-10-CM | POA: Diagnosis not present

## 2020-01-21 DIAGNOSIS — R2689 Other abnormalities of gait and mobility: Secondary | ICD-10-CM | POA: Diagnosis not present

## 2020-01-21 DIAGNOSIS — J9601 Acute respiratory failure with hypoxia: Secondary | ICD-10-CM | POA: Diagnosis not present

## 2020-01-21 NOTE — Progress Notes (Signed)
EPIC Encounter for ICM Monitoring  Patient Name: Laurie [name] is a 74 y.o. female Date: 01/21/2020 Primary Care Physican: Tisovec, Fransico Him, MD Primary Cardiologist:[place] Electrophysiologist:Klein Bi-V Pacing:>99% LastWeight:196lbs   Attempted call to patient and unable to reach.   Transmission reviewed.   CorVueThoracic impedancesuggesting possible dryness since 01/16/2020.  Impedance suggesting possible fluid accumulation from 4/28-5/4 correlating with hospitalization.  Prescribed:  Furosemide40 mg take 2 tablets (80 mg total) by mouth every morning and 1 tablet (40 mg total) every evening. Take extra tablet as needed for weight gain of 3 lbs or more.   Potassium 20 mEq take 1 tablet daily.  Labs: 01/12/2020 Creatinine 0.92, BUN 21, Potassium 3.3, Sodium 136, GFR >60 01/11/2020 Creatinine 0.85, BUN 22, Potassium 3.7, Sodium 135, GFR >60 01/10/2020 Creatinine 0.84, BUN 24, Potassium 3.4, Sodium 137, GFR >60 01/09/2020 Creatinine 1.02, BUN 21, Potassium 3.6, Sodium 137, GFR 55->60 01/08/2020 Potassium 7.4, Sodium 133  01/08/2020 Creatinine 1.00, BUN 17, Potassium 5.5, Sodium 138, GFR 56->60  A complete set of results can be found in Results Review.  Recommendations: Unable to reach.    Follow-up plan: ICM clinic phone appointment on5/18/2021 to recheck fluid levels. 91 day device clinic remote transmission6/10/2019.  Office visit with Dr [name] on 01/22/2020 post hospital visit.  Copy of ICM check sent to Dr.Klein and Dr Bettina Gavia.  3 month ICM trend: 01/21/2020    1 Year ICM trend:       [name], RN 01/21/2020 12:59 PM

## 2020-01-21 NOTE — Telephone Encounter (Signed)
Remote ICM transmission received.  Attempted call to patient regarding ICM remote transmission and no answer.  

## 2020-01-22 ENCOUNTER — Other Ambulatory Visit: Payer: Self-pay

## 2020-01-22 ENCOUNTER — Ambulatory Visit: Payer: Medicare HMO | Admitting: Internal Medicine

## 2020-01-22 ENCOUNTER — Encounter: Payer: Self-pay | Admitting: Internal Medicine

## 2020-01-22 VITALS — BP 142/74 | HR 93 | Ht 62.0 in | Wt 179.2 lb

## 2020-01-22 DIAGNOSIS — I429 Cardiomyopathy, unspecified: Secondary | ICD-10-CM | POA: Diagnosis not present

## 2020-01-22 DIAGNOSIS — I442 Atrioventricular block, complete: Secondary | ICD-10-CM

## 2020-01-22 DIAGNOSIS — I480 Paroxysmal atrial fibrillation: Secondary | ICD-10-CM

## 2020-01-22 DIAGNOSIS — I428 Other cardiomyopathies: Secondary | ICD-10-CM | POA: Diagnosis not present

## 2020-01-22 DIAGNOSIS — Z95810 Presence of automatic (implantable) cardiac defibrillator: Secondary | ICD-10-CM

## 2020-01-22 NOTE — Patient Instructions (Signed)
Medication Instructions:  Your physician has recommended you make the following change in your medication:   Please DO NOT take the following medications until after your lab work on Friday at [facility]  ** ENTRESTO  **ALDACTONE  **LASIX  Labwork: None ordered.  Testing/Procedures: None ordered.  Follow-Up: Your physician wants you to follow-up in: 02/06/2020 at 1pm with Tommye Standard, PA.   Remote monitoring is used to monitor your Pacemaker of ICD from home. This monitoring reduces the number of office visits required to check your device to one time per year. It allows [place] to keep an eye on the functioning of your device to ensure it is working properly.   Any Other Special Instructions Will Be Listed Below (If Applicable).  If you need a refill on your cardiac medications before your next appointment, please call your pharmacy.

## 2020-01-22 NOTE — Progress Notes (Signed)
Patient Care Team: Tisovec, Fransico Him, MD as PCP - General (Internal Medicine) Jacolyn Reedy, MD as PCP - Cardiology (Cardiology)   HPI  Laurie [name] is a 74 y.o. female Seen in followup for pacemaker implantation 2/14 for intermittent complete heart block and syncope.  With CRT upgrade 2/17.  she has atrial fibrillation.  She has some degree of sinus tachycardia with her mean heart rate right at 100 beats per minute;    she has been intolerant of beta blockers    Hospitalized for congestive heart failure was noted to have progressive left ventricular dysfunction and acute on chronic heart failure. The issue was raised as to whether this was pacemaker-induced cardiomyopathy as her ejection fraction  had gone to 30-35% with the evolution of 100% ventricular pacing whereas with no ventricular pacing in 7/15 LV function was normal. 2/17 she underwent CRT upgrade.  She has a history of coronary artery disease with prior CABG.    She is also had problems with a stroke and has had difficulty with balance ever since.  DATE TEST EF   7/15 Echo   65 %   2/17 `LHC 35% LIMA; SVG>OM,D patent SVG RCA T  2/17 Echo   35 %   7/17 Echo  40.-45%   2/19 LHC 45% LIMA- LAD Patent; SVG D, OM Stentx 2 SVG RCA T  3*19 Echo  45%   4/21 Echo  30-35%   4/21 LHC  LIMA-LADp; SVG-OM-95%>POBA; SVG-D 60-70% distal    Date Cr K  Hgb  2/19 0.97 4.2 13.4  5/21 2.3<<0.92 4.7<<3.3 13.5           Hospitalized 4/21 following a fall/found on the ground. Hyperkalemic; hypoxic in ER  CXR >> R lung opacity; non-STEMI question demand Underwent catheterization and angioplasty of SVG-OM in the setting of the above.  Discharged having been aggressively diuresed.  Seen yesterday by her PCP.  Laboratory abnormalities as noted above with an increase in creatinine.  They advised her to hold her diuretics and repeat blood work on Friday.  She is unaware of this.  I called and spoke with him.  From her  perspective the balance issues are the biggest problem--since her stroke   She denies chest pain shortness of breath lightheadedness or edema.  However, is also clear that she does not know what medication she is taking.  No bleeding      Thromboembolic risk factors ( age -41, HTN-1, TIA/CVA-2 , DM-1, Vasc disease -1, CHF-1, Gender-1) for a CHADSVASc Score of 7-9  Past Medical History:  Diagnosis Date  . AICD (automatic cardioverter/defibrillator) present   . Bell's palsy 01/05/2010  . CAD (coronary artery disease)    Cath  07/21/1999  mild ostial, 80% stenosis proximal LAD, 90% stenosis proximal Diag 1, 95% stenosis proximal OM 1, 80% stenosis proximal RCA, 95% stenosis mid RCA    CABG 07/22/99  LIMA to LAD, SVG to dx, SVG to OM, SVG to PDA  Dr. [name]   Cardiolite 2012 no ischemia EF 67%  Cath 10/23/12 Severe native three-vessel coronary artery disease with occlusion of all 3 native coronary arteries, Patent sap  . Chronic lower back pain   . Chronic systolic CHF (congestive heart failure) ([place]) 10/22/2015  . Cystitis   . Hepatitis 1970s   "don't know which" (03/17/2017)  . HIstory of Bell's palsy    October 2012   . Hyperlipidemia    Intolerance to several statins   .  Hypertensive heart disease without CHF   . Hypoxia 01/09/2020  . Multinodular goiter   . Obesity (BMI 30-39.9)   . OSA on CPAP    "suppose to wear a mask" (03/17/2017)  . Peripheral neuropathy   . Pneumonia 1990s X 1   "think i had walking pneumonia"  . Seasonal allergies   . Stroke ([place]) 03/2017   "light one"; denies residual on 03/17/2017)  . Type II diabetes mellitus ([place])     Past Surgical History:  Procedure Laterality Date  . BI-VENTRICULAR PACEMAKER UPGRADE  11/05/2015   "upgraded my pacemaker"  . BIOPSY THYROID  05/2010   percutaneous  . CARDIAC CATHETERIZATION N/A 10/21/2015   Procedure: Left Heart Cath and Cors/Grafts Angiography;  Surgeon: Belva Crome, MD;  Location: [place] [facility];  Service:  Cardiovascular;  Laterality: N/A;  . CARPAL TUNNEL RELEASE Bilateral   . CATARACT EXTRACTION Right 01/06/2015  . CATARACT EXTRACTION W/ INTRAOCULAR LENS IMPLANT Left 12/16/2014  . CORONARY ARTERY BYPASS GRAFT  2000  . CORONARY BALLOON ANGIOPLASTY N/A 01/09/2020   Procedure: CORONARY BALLOON ANGIOPLASTY;  Surgeon: Wellington Hampshire, MD;  Location: [place] [facility];  Service: Cardiovascular;  Laterality: N/A;  . CORONARY STENT INTERVENTION N/A 10/28/2017   Procedure: CORONARY STENT INTERVENTION;  Surgeon: Jettie Booze, MD;  Location: [place] [facility];  Service: Cardiovascular;  Laterality: N/A;  . CYSTECTOMY Right    "middle finger"  . DENTAL IMPLANTS    . EP IMPLANTABLE DEVICE N/A 11/05/2015   Procedure: BiV Upgrade;  Surgeon: Deboraha Sprang, MD;  Location: [place] [facility];  Service: Cardiovascular;  Laterality: N/A;  . INSERT / REPLACE / REMOVE PACEMAKER    . LEAD REVISION N/A 10/25/2012   Procedure: LEAD REVISION;  Surgeon: Evans Lance, MD;  Location: [facility] CATH LAB;  Service: Cardiovascular;  Laterality: N/A;  . LEFT HEART CATH AND CORS/GRAFTS ANGIOGRAPHY N/A 10/28/2017   Procedure: LEFT HEART CATH AND CORS/GRAFTS ANGIOGRAPHY;  Surgeon: Jettie Booze, MD;  Location: [place] [facility];  Service: Cardiovascular;  Laterality: N/A;  . LEFT HEART CATH AND CORS/GRAFTS ANGIOGRAPHY N/A 01/09/2020   Procedure: LEFT HEART CATH AND CORS/GRAFTS ANGIOGRAPHY;  Surgeon: Wellington Hampshire, MD;  Location: [place] [facility];  Service: Cardiovascular;  Laterality: N/A;  . LEFT HEART CATHETERIZATION WITH CORONARY ANGIOGRAM N/A 10/23/2012   Procedure: LEFT HEART CATHETERIZATION WITH CORONARY ANGIOGRAM;  Surgeon: Jacolyn Reedy, MD;  Location: [facility] CATH LAB;  Service: Cardiovascular;  Laterality: N/A;  . PERMANENT PACEMAKER INSERTION N/A 10/23/2012   Procedure: PERMANENT PACEMAKER INSERTION;  Surgeon: Deboraha Sprang, MD;  Location: [facility] CATH LAB;  Service: Cardiovascular;  Laterality: N/A;  . PLANTAR  FASCIA RELEASE Left    "& clipped a tendon that went thru bottom of my foot"  . TEMPORARY PACEMAKER INSERTION N/A 10/22/2012   Procedure: TEMPORARY PACEMAKER INSERTION;  Surgeon: Troy Sine, MD;  Location: [facility] LAB;  Service: Cardiovascular;  Laterality: N/A;  . TUBAL LIGATION      Current Outpatient Medications  Medication Sig Dispense Refill  . ACCU-CHEK FASTCLIX LANCETS MISC USE  TO CHECK BLOOD SUGAR FOUR TIMES DAILY 408 each 2  . ACCU-CHEK SMARTVIEW test strip USE  TO CHECK BLOOD SUGAR FOUR TIMES DAILY 400 each 1  . Alcohol Swabs (B-D SINGLE USE SWABS REGULAR) PADS Use 8 daily for testing blood sugar and insulin injections. 800 each 1  . aspirin 81 MG tablet Take 1 tablet (81 mg total) by  mouth daily. 30 tablet 11  . BD VEO INSULIN SYRINGE U/F 31G X 15/64" 1 ML MISC USE 10 TIMES A DAY WITH INSULIN AS DIRECTED 300 each 0  . beta carotene w/minerals (OCUVITE) tablet Take 1 tablet by mouth daily.    . Blood Glucose Calibration (ACCU-CHEK SMARTVIEW CONTROL) LIQD Use to calibrate blood glucose meter. 3 each 1  . cetirizine (ZYRTEC) 10 MG tablet Take 1 tablet (10 mg total) by mouth daily.    . cholecalciferol (VITAMIN D) 1000 UNITS tablet Take 1,000 Units by mouth daily.     . clopidogrel (PLAVIX) 75 MG tablet Take 1 tablet (75 mg total) by mouth daily. 90 tablet 1  . Continuous Blood Gluc Sensor (FREESTYLE LIBRE 14 DAY SENSOR) MISC APPLY EVERY 14 DAYS 2 each 0  . furosemide (LASIX) 40 MG tablet TAKE 2 TABLETS BY MOUTH EVERY MORNING AND 1 TABLET EVERY EVENING. TAKE 1 ADDITIONAL TABLET FOR WEIGHT GAIN OF 3LBS OR MORE 360 tablet 0  . insulin glargine (LANTUS) 100 UNIT/ML injection Inject 0.2 mLs (20 Units total) into the skin at bedtime. 10 mL 11  . Insulin Pen Needle (BD PEN NEEDLE NANO U/F) 32G X 4 MM MISC 1 each by Does not apply route 3 (three) times daily. Use to inject insulin TID; E11.40 100 each 2  . ketoconazole (NIZORAL) 2 % cream Apply 1 application topically daily as needed for  irritation. TO AFFECTED AREAS OF FACE     . metoprolol succinate (TOPROL-XL) 25 MG 24 hr tablet Take 3 tablets (75 mg total) by mouth daily. Take with or immediately following a meal. 90 tablet 0  . nitroGLYCERIN (NITROSTAT) 0.4 MG SL tablet Place 0.4 mg under the tongue every 5 (five) minutes as needed for chest pain.     . potassium chloride SA (KLOR-CON M20) 20 MEQ tablet Take 1 tablet (20 mEq total) by mouth daily. 180 tablet 2  . Probiotic Product (PROBIOTIC DAILY) CAPS Take 1 capsule by mouth daily.    . psyllium (METAMUCIL) 58.6 % powder Take 1 packet by mouth daily as needed (fiber).     . rosuvastatin (CRESTOR) 40 MG tablet Take 1 tablet (40 mg total) by mouth daily. 30 tablet 0  . sacubitril-valsartan (ENTRESTO) 24-26 MG Take 1 tablet by mouth 2 (two) times daily. 60 tablet 0  . vitamin C (ASCORBIC ACID) 500 MG tablet Take 500 mg by mouth every other day.     No current facility-administered medications for this visit.    Allergies  Allergen Reactions  . Brilinta [Ticagrelor] Shortness Of Breath  . Statins Other (See Comments)    Statins make the legs ache  . Tricor [Fenofibrate] Other (See Comments)    Leg pain  . Latex Itching and Other (See Comments)    Affected areas turn red, also-  . Tape Rash    Heart leads break out the skin    Review of Systems negative except from HPI and PMH  Physical Exam BP (!) 142/74   Pulse 93   Ht 5\' 2"  (1.575 m)   Wt 179 lb 3.2 oz (81.3 kg)   SpO2 93%   BMI 32.78 kg/m  Well developed and well nourished in no acute distress HENT normal Neck supple with JVP-flat Clear Device pocket well healed; without hematoma or erythema.  There is no tethering  Regular rate and rhythm, no  murmur Abd-soft with active BS No Clubbing cyanosis  edema Skin-warm and dry A & Oriented  Grossly normal sensory  and motor function  ECG sinus @ 91 P-synchronous/ BiV  pacing upright QRS lead V1 and qRs lead 1  Assessment and  Plan  Complete heart  block permanent  CRT-D The patient's device was interrogated.  The information was reviewed. No changes were made in the programming.   Hypertension  Well controlled   Coronary artery disease status post CABG previously normal LV function  .Without symptoms of ischemia  Cardiomyopathy ? Mechanism  Ischemic vs pacemaker induced  Atrial fibrillation  CHF chronic  Right lung opacity  Renal insufficiency acute   Laboratories did demonstrate acute renal insufficiency likely related to overdiuresis.  PCPs office has asked the patient to hold her diuretics with repeat blood work on Friday.  The patient had not understood this.  She was still taking her furosemide.  She states that she is taking her Entresto.  And Aldactone had just been started.  We asked her husband to come up and went over this with him.  Blood work is scheduled at [facility] on Friday. .  Medications should be resumed at the time of her follow-up blood work.   We will arrange follow-up in our office in a couple of weeks    Have also reached out to PCP regarding follow-up chest x-ray for the pneumonia.   We spent more than 50% of our >50 min visit in face to face counseling regarding the above

## 2020-01-23 ENCOUNTER — Encounter: Payer: Self-pay | Admitting: Neurology

## 2020-01-23 ENCOUNTER — Telehealth: Payer: Self-pay

## 2020-01-23 ENCOUNTER — Ambulatory Visit: Payer: Medicare HMO | Admitting: Neurology

## 2020-01-23 NOTE — Telephone Encounter (Signed)
Patient no showed 01/23/2020 new patient appointment with Dr. Krista Blue.  This is her first no show.

## 2020-01-25 DIAGNOSIS — I509 Heart failure, unspecified: Secondary | ICD-10-CM | POA: Diagnosis not present

## 2020-01-25 DIAGNOSIS — J90 Pleural effusion, not elsewhere classified: Secondary | ICD-10-CM | POA: Diagnosis not present

## 2020-01-25 DIAGNOSIS — I119 Hypertensive heart disease without heart failure: Secondary | ICD-10-CM | POA: Diagnosis not present

## 2020-01-25 DIAGNOSIS — E871 Hypo-osmolality and hyponatremia: Secondary | ICD-10-CM | POA: Diagnosis not present

## 2020-01-25 DIAGNOSIS — N179 Acute kidney failure, unspecified: Secondary | ICD-10-CM | POA: Diagnosis not present

## 2020-01-25 DIAGNOSIS — I480 Paroxysmal atrial fibrillation: Secondary | ICD-10-CM | POA: Diagnosis not present

## 2020-01-26 DIAGNOSIS — G9341 Metabolic encephalopathy: Secondary | ICD-10-CM | POA: Diagnosis not present

## 2020-01-26 DIAGNOSIS — E1151 Type 2 diabetes mellitus with diabetic peripheral angiopathy without gangrene: Secondary | ICD-10-CM | POA: Diagnosis not present

## 2020-01-26 DIAGNOSIS — I2510 Atherosclerotic heart disease of native coronary artery without angina pectoris: Secondary | ICD-10-CM | POA: Diagnosis not present

## 2020-01-26 DIAGNOSIS — I110 Hypertensive heart disease with heart failure: Secondary | ICD-10-CM | POA: Diagnosis not present

## 2020-01-26 DIAGNOSIS — I5022 Chronic systolic (congestive) heart failure: Secondary | ICD-10-CM | POA: Diagnosis not present

## 2020-01-26 DIAGNOSIS — I482 Chronic atrial fibrillation, unspecified: Secondary | ICD-10-CM | POA: Diagnosis not present

## 2020-01-26 DIAGNOSIS — J189 Pneumonia, unspecified organism: Secondary | ICD-10-CM | POA: Diagnosis not present

## 2020-01-26 DIAGNOSIS — E1165 Type 2 diabetes mellitus with hyperglycemia: Secondary | ICD-10-CM | POA: Diagnosis not present

## 2020-01-26 DIAGNOSIS — E875 Hyperkalemia: Secondary | ICD-10-CM | POA: Diagnosis not present

## 2020-01-29 ENCOUNTER — Ambulatory Visit (INDEPENDENT_AMBULATORY_CARE_PROVIDER_SITE_OTHER): Payer: Medicare HMO

## 2020-01-29 ENCOUNTER — Telehealth: Payer: Self-pay

## 2020-01-29 DIAGNOSIS — I5022 Chronic systolic (congestive) heart failure: Secondary | ICD-10-CM

## 2020-01-29 DIAGNOSIS — Z95810 Presence of automatic (implantable) cardiac defibrillator: Secondary | ICD-10-CM

## 2020-01-29 NOTE — Progress Notes (Signed)
Call to [facility] and left message for Medical Records to fax latest lab results to Dr Olin Pia office and fax number is [phone].  Left my name and number for call back if needed.

## 2020-01-29 NOTE — Telephone Encounter (Signed)
ICM call to patient and she reports she is feeling fine.  She denies fluid symptoms. Transmission reviewed.  Discussed limiting dietary salt intake.  She mainly eats restaurant foods twice a day for her meals and advised these foods are typically high in salt.     CorVueThoracic impedancesuggesting possible fluid accumulation starting 01/26/2020 (Lasix on hold since that date). Impedance is starting to trending up toward baseline.  Patient reports Lasix is still on hold per instructions from Dr Osborne Casco and Dr Olin Pia office  Labs: 01/12/2020 Creatinine 0.92, BUN 21, Potassium 3.3, Sodium 136, GFR >60 01/11/2020 Creatinine 0.85, BUN 22, Potassium 3.7, Sodium 135, GFR >60 01/10/2020 Creatinine 0.84, BUN 24, Potassium 3.4, Sodium 137, GFR >60 01/09/2020 Creatinine1.02, BUN21, Potassium3.6, [id number], GFR55->60 01/08/2020 Potassium7.4, Sodium133  01/08/2020 Creatinine1.00, BUN17, Potassium5.5, Sodium138, GFR56->60 A complete set of results can be found in Results Review.  Patient confirms she has not restarted Lasix which was held by Dr Osborne Casco office.  Dr Olin Pia 5/11 OV note says to hold Entresto, Aldactone and Lasix until lab results come back from [facility] on 5/18.    Office visit with Tommye Standard, PA on 02/06/2020.  Copy sent to Dr.Klein for review and recommendations if needed.  3 month ICM trend: 01/29/2020    1 Year ICM trend:

## 2020-01-29 NOTE — Progress Notes (Signed)
Spoke with patient and advised of Dr Olin Pia recommendations to have another remote check on 02/01/2020.  Advised called [place] to obtain latest lab results.  She needs assistance sending manual remote transmission on 5/21 and advised will call her to provide assistance.

## 2020-01-29 NOTE — Telephone Encounter (Signed)
Call to [facility] and left message for Medical Records to fax latest lab results to Dr Olin Pia office and fax number is [phone].  Left my name and number for call back if needed.

## 2020-01-29 NOTE — Telephone Encounter (Signed)
Attempted call to patient to advise of Dr Olin Pia recommendation to recheck fluid levels in a couple of days. Unable to leave a message since mail box is full. ICM remote transmission scheduled for 02/01/2020 to recheck fluid levels.

## 2020-01-29 NOTE — Progress Notes (Signed)
EPIC Encounter for ICM Monitoring  Patient Name: Laurie [name] is a 74 y.o. female Date: 01/29/2020 Primary Care Physican: Tisovec, Fransico Him, MD Primary Cardiologist:Klein Electrophysiologist:Klein Bi-V Pacing:98% 5/18/2021Weight:178lbs       Spoke with patient and she denies fluid symptoms.   Discussed limiting dietary salt intake.  She mainly eats restaurant foods twice a day for her meals and advised these foods are typically high in salt.     CorVueThoracic impedancesuggesting possible fluid accumulation starting 01/26/2020 (Lasix on hold since that date). Impedance is starting to trending up toward baseline.  Prescribed:  Furosemide40 mg take 2 tablets (80 mg total) by mouth every morning and 1 tablet (40 mg total) every evening. Take extra tablet as needed for weight gain of 3 lbs or more.   Potassium 20 mEq take 1 tablet daily.  Labs: 01/12/2020 Creatinine 0.92, BUN 21, Potassium 3.3, Sodium 136, GFR >60 01/11/2020 Creatinine 0.85, BUN 22, Potassium 3.7, Sodium 135, GFR >60 01/10/2020 Creatinine 0.84, BUN 24, Potassium 3.4, Sodium 137, GFR >60 01/09/2020 Creatinine1.02, BUN21, Potassium3.6, [id number], GFR55->60 01/08/2020 Potassium7.4, Sodium133  01/08/2020 Creatinine1.00, BUN17, Potassium5.5, Sodium138, GFR56->60 A complete set of results can be found in Results Review.  Recommendations: Patient confirms she has not restarted Lasix which was held by Dr Osborne Casco office.  Dr Olin Pia 5/11 OV note says to hold Entresto, Aldactone and Lasix until lab results come back from [facility] on 5/18.    Follow-up plan: ICM clinic phone appointment on5/25/2021to recheck fluid levels. 91 day device clinic remote transmission6/10/2019.  Office visit with Tommye Standard, PA on 02/06/2020.  Copy of ICM check sent to Dr.Klein as well as phone note asking for review and recommendations if needed.  3 month ICM trend: 01/29/2020    1 Year ICM  trend:       [name], RN 01/29/2020 10:22 AM

## 2020-01-29 NOTE — Progress Notes (Signed)
Laurie Sprang, MD  Physician  Electrophysiology  Telephone Encounter  Signed  Creation Time:  01/29/2020 11:15 AM          Signed        Lets re check in a couple of days,  it looks like it m ight be trending up-- Moreover we will need the lab work from [facility]  [phone]  ext [phone] is the back door number :))

## 2020-01-29 NOTE — Progress Notes (Signed)
Attempted call to patient to advise of Dr Olin Pia recommendation to recheck fluid levels in a couple of days.  ICM remote transmission scheduled for 02/01/2020 to recheck fluid levels.

## 2020-01-29 NOTE — Telephone Encounter (Signed)
Spoke with patient and advised of Dr Olin Pia recommendations to have another remote check on 02/01/2020.  Advised called [place] to obtain latest lab results.  She needs assistance sending manual remote transmission on 5/21 and advised will call her to provide assistance.

## 2020-01-29 NOTE — Telephone Encounter (Signed)
Lets re check in a couple of days,  it looks like it m ight be trending up-- Moreover we will need the lab work from [facility]  [phone]  ext [phone] is the back door number :))

## 2020-01-30 DIAGNOSIS — E871 Hypo-osmolality and hyponatremia: Secondary | ICD-10-CM | POA: Diagnosis not present

## 2020-01-30 DIAGNOSIS — E1165 Type 2 diabetes mellitus with hyperglycemia: Secondary | ICD-10-CM | POA: Diagnosis not present

## 2020-01-30 DIAGNOSIS — I480 Paroxysmal atrial fibrillation: Secondary | ICD-10-CM | POA: Diagnosis not present

## 2020-01-30 DIAGNOSIS — N179 Acute kidney failure, unspecified: Secondary | ICD-10-CM | POA: Diagnosis not present

## 2020-01-30 DIAGNOSIS — I130 Hypertensive heart and chronic kidney disease with heart failure and stage 1 through stage 4 chronic kidney disease, or unspecified chronic kidney disease: Secondary | ICD-10-CM | POA: Diagnosis not present

## 2020-01-30 DIAGNOSIS — I509 Heart failure, unspecified: Secondary | ICD-10-CM | POA: Diagnosis not present

## 2020-01-30 DIAGNOSIS — Z794 Long term (current) use of insulin: Secondary | ICD-10-CM | POA: Diagnosis not present

## 2020-01-31 LAB — CUP PACEART INCLINIC DEVICE CHECK
Battery Remaining Longevity: 38 mo
Brady Statistic RA Percent Paced: 6.4 %
Brady Statistic RV Percent Paced: 99.27 %
Date Time Interrogation Session: 20210511151800
HighPow Impedance: 77.625
Implantable Lead Implant Date: 20140210
Implantable Lead Implant Date: 20170222
Implantable Lead Implant Date: 20170222
Implantable Lead Location: 753858
Implantable Lead Location: 753859
Implantable Lead Location: 753860
Implantable Lead Model: 4298
Implantable Lead Model: 5076
Implantable Lead Model: 7122
Implantable Pulse Generator Implant Date: 20170222
Lead Channel Impedance Value: 375 Ohm
Lead Channel Impedance Value: 412.5 Ohm
Lead Channel Impedance Value: 525 Ohm
Lead Channel Pacing Threshold Amplitude: 0.625 V
Lead Channel Pacing Threshold Amplitude: 0.75 V
Lead Channel Pacing Threshold Amplitude: 0.75 V
Lead Channel Pacing Threshold Amplitude: 0.75 V
Lead Channel Pacing Threshold Pulse Width: 0.5 ms
Lead Channel Pacing Threshold Pulse Width: 0.5 ms
Lead Channel Pacing Threshold Pulse Width: 0.5 ms
Lead Channel Pacing Threshold Pulse Width: 0.5 ms
Lead Channel Sensing Intrinsic Amplitude: 12 mV
Lead Channel Sensing Intrinsic Amplitude: 3.7 mV
Lead Channel Setting Pacing Amplitude: 2 V
Lead Channel Setting Pacing Amplitude: 2 V
Lead Channel Setting Pacing Amplitude: 2 V
Lead Channel Setting Pacing Pulse Width: 0.5 ms
Lead Channel Setting Pacing Pulse Width: 0.5 ms
Lead Channel Setting Sensing Sensitivity: 0.5 mV
Pulse Gen Serial Number: 7341405

## 2020-02-01 ENCOUNTER — Ambulatory Visit (INDEPENDENT_AMBULATORY_CARE_PROVIDER_SITE_OTHER): Payer: Medicare HMO

## 2020-02-01 ENCOUNTER — Telehealth: Payer: Self-pay

## 2020-02-01 DIAGNOSIS — Z95810 Presence of automatic (implantable) cardiac defibrillator: Secondary | ICD-10-CM

## 2020-02-01 DIAGNOSIS — I5022 Chronic systolic (congestive) heart failure: Secondary | ICD-10-CM

## 2020-02-01 NOTE — Progress Notes (Signed)
EPIC Encounter for ICM Monitoring  Patient Name: Laurie [name] is a 74 y.o. female Date: 02/01/2020 Primary Care Physican: Haywood Pao, MD Primary Cardiologist:Klein Electrophysiologist:Klein Bi-V Pacing:98% 5/18/2021Weight:178lbs 02/01/2020 Weight: 171 lbs       Spoke with patient and she denies fluid symptoms.   Reiterated restaurant foods are typically high in salt and try to avoid if possible.  She eats restaurants foods at 1-2 times a day.     CorVueThoracic impedancesuggesting possible fluid accumulation starting 01/26/2020 (Lasix on hold since that date). Impedance has improved since 5/18 remote check.  Prescribed: FUROSEMIDE ON HOLD 5/15  Furosemide40 mg take 2 tablets (80 mg total) by mouth every morning and 1 tablet (40 mg total) every evening. Take extra tablet as needed for weight gain of 3 lbs or more.   Potassium 20 mEq take 1 tablet daily.  Labs:  Waiting on [place] to fax BMET results that were completed 01/29/20. 01/12/2020 Creatinine 0.92, BUN 21, Potassium 3.3, Sodium 136, GFR >60 01/11/2020 Creatinine 0.85, BUN 22, Potassium 3.7, Sodium 135, GFR >60 01/10/2020 Creatinine 0.84, BUN 24, Potassium 3.4, Sodium 137, GFR >60 01/09/2020 Creatinine1.02, BUN21, Potassium3.6, [id number], GFR55->60 01/08/2020 Potassium7.4, Sodium133  01/08/2020 Creatinine1.00, BUN17, Potassium5.5, Sodium138, GFR56->60 A complete set of results can be found in Results Review.  Recommendations: 2nd call to [facility] to fax last BMET results.  Advised patient to limit salt intake and will send copy of report to Dr [name] for review.  Follow-up plan: ICM clinic phone appointment on6/1/2021to recheck fluid levels. 91 day device clinic remote transmission6/10/2019. Office visit with Tommye Standard, PA on 02/06/2020.  Copy of ICM check sent to Dr.Klein as well as phone note asking for review and recommendations if needed.  3 month  ICM trend: 02/01/2020    1 Year ICM trend:       [name], RN 02/01/2020 11:52 AM

## 2020-02-01 NOTE — Telephone Encounter (Signed)
Spoke with patient to remind of missed remote transmission 

## 2020-02-01 NOTE — Telephone Encounter (Signed)
Recheck of remote transmission fluid levels per recommendation of Dr [name].     Spoke with patient and she denies fluid symptoms.  Patients weight is down 5 pounds since 5/18 remote check.  No LLE or shortness of breath.  She says she feels fine.  Reiterated restaurant foods are typically high in salt and try to avoid if possible.  She eats restaurants foods at 1-2 times a day.     CorVueThoracic impedancecontinues to suggest possible fluid accumulation starting 01/26/2020 (Lasix on hold since that date). Impedance has improved since 5/18 remote check.  Prescribed: FUROSEMIDE ON HOLD 5/15 Furosemide40 mg take 2 tablets (80 mg total) by mouth every morning and 1 tablet (40 mg total) every evening. Take extra tablet as needed for weight gain of 3 lbs or more.  Potassium 20 mEq take 1 tablet daily.  Dr Olin Pia 5/11 OV notesays to hold Entresto, Aldactone and Lasix until lab results come back from [facility] on 5/18.   Waiting on [place] to fax BMET results that were completed 01/29/20. 01/12/2020 Creatinine 0.92, BUN 21, Potassium 3.3, Sodium 136, GFR >60 01/11/2020 Creatinine 0.85, BUN 22, Potassium 3.7, Sodium 135, GFR >60 A complete set of results can be found in Results Review.  Recommendations: 2nd call to [facility] to fax last BMET results.  Advised patient to limit salt intake.  Follow-up plan: ICM clinic phone appointment on6/1/2021to recheck fluid levels.   Office visit with Tommye Standard, PA on 02/06/2020.     3 month ICM trend: 02/01/2020    1 Year ICM trend:

## 2020-02-05 NOTE — Progress Notes (Deleted)
Cardiology Office Note Date:  02/05/2020  Patient ID:  Laurie [name], Laurie [name] 1946/06/08, MRN [id number] PCP:  Haywood Pao, MD  Cardiologist:  Dr. Bettina Gavia EP: Dr. [name]  ***refresh   Chief Complaint: *** 2 week f/u  History of Present Illness: Laurie [name] is a 75 y.o. female with history of CAD (CABG 2000 >> 12/2019 POBA to SVG OM), bells Palsy, HTN, HLD, CM, chronic CHF (systolic), AFib, stroke (left with chronic balance problems), OSA (***), DM, CHB  She was hospitalized with 01/08/20 with sepsis/pneumonia, resp failure.  Trop were elevated suspect demand though did undergo LHC w/PCI w/POBA.  LVEF 30-35% ACE transitioned to [facility]  Mentions waxing/waning AMS (reported by family occasionally at home as well).  Noted her DM poorly controlled.  Poor family and particualry patient knowledge on meds and regime.  Planned fo close PMD and cardiology follow up.  Discharged 01/12/20  She comes in today to be seen for Dr. [name].  He saw her 01/22/20, she was post hospital stay, had PCI to an SVG and diuresed.  He noted labs with elevated Creat, AKI.  She was apparently instructed by the PMD office previously to hold lasix but had not.  Dr. [name] advised her to hold the lasix, Entresto, and aldactone until after f/u labs were done (scheduled for that week it seems)  He also apparently reached out to the PMD to f/u on CXR for pneumonia..  There is mention of BB intolerance, with baseline sinus rates about 100.  CorVue monitoring with L. Short, RN noted worsening volume status, pending labs from [place]  *** labs, PMD *** back on HF meds? *** meds, CAD POBA last month *** volume status *** AFib burden *** CPAP? *** no OAC, a/c ????    Device information SJM CRT-D 11/05/2015 (RV/LV leads 2017), RA lead 2014 Has both MDT and SJM leads He has an abandoned RV pacing lead   Past Medical History:  Diagnosis Date  . AICD (automatic cardioverter/defibrillator) present   . Bell's  palsy 01/05/2010  . CAD (coronary artery disease)    Cath  07/21/1999  mild ostial, 80% stenosis proximal LAD, 90% stenosis proximal Diag 1, 95% stenosis proximal OM 1, 80% stenosis proximal RCA, 95% stenosis mid RCA    CABG 07/22/99  LIMA to LAD, SVG to dx, SVG to OM, SVG to PDA  Dr. [name]   Cardiolite 2012 no ischemia EF 67%  Cath 10/23/12 Severe native three-vessel coronary artery disease with occlusion of all 3 native coronary arteries, Patent sap  . Chronic lower back pain   . Chronic systolic CHF (congestive heart failure) ([place]) 10/22/2015  . Cystitis   . Hepatitis 1970s   "don't know which" (03/17/2017)  . HIstory of Bell's palsy    October 2012   . Hyperlipidemia    Intolerance to several statins   . Hypertensive heart disease without CHF   . Hypoxia 01/09/2020  . Multinodular goiter   . Obesity (BMI 30-39.9)   . OSA on CPAP    "suppose to wear a mask" (03/17/2017)  . Peripheral neuropathy   . Pneumonia 1990s X 1   "think i had walking pneumonia"  . Seasonal allergies   . Stroke ([place]) 03/2017   "light one"; denies residual on 03/17/2017)  . Type II diabetes mellitus ([place])     Past Surgical History:  Procedure Laterality Date  . BI-VENTRICULAR PACEMAKER UPGRADE  11/05/2015   "upgraded my pacemaker"  . BIOPSY THYROID  05/2010  percutaneous  . CARDIAC CATHETERIZATION N/A 10/21/2015   Procedure: Left Heart Cath and Cors/Grafts Angiography;  Surgeon: Belva Crome, MD;  Location: [place] [facility];  Service: Cardiovascular;  Laterality: N/A;  . CARPAL TUNNEL RELEASE Bilateral   . CATARACT EXTRACTION Right 01/06/2015  . CATARACT EXTRACTION W/ INTRAOCULAR LENS IMPLANT Left 12/16/2014  . CORONARY ARTERY BYPASS GRAFT  2000  . CORONARY BALLOON ANGIOPLASTY N/A 01/09/2020   Procedure: CORONARY BALLOON ANGIOPLASTY;  Surgeon: Wellington Hampshire, MD;  Location: [place];  Service: Cardiovascular;  Laterality: N/A;  . CORONARY STENT INTERVENTION N/A 10/28/2017   Procedure: CORONARY STENT  INTERVENTION;  Surgeon: Jettie Booze, MD;  Location: [place] [facility];  Service: Cardiovascular;  Laterality: N/A;  . CYSTECTOMY Right    "middle finger"  . DENTAL IMPLANTS    . EP IMPLANTABLE DEVICE N/A 11/05/2015   Procedure: BiV Upgrade;  Surgeon: Deboraha Sprang, MD;  Location: [facility];  Service: Cardiovascular;  Laterality: N/A;  . INSERT / REPLACE / REMOVE PACEMAKER    . LEAD REVISION N/A 10/25/2012   Procedure: LEAD REVISION;  Surgeon: Evans Lance, MD;  Location: [facility] CATH LAB;  Service: Cardiovascular;  Laterality: N/A;  . LEFT HEART CATH AND CORS/GRAFTS ANGIOGRAPHY N/A 10/28/2017   Procedure: LEFT HEART CATH AND CORS/GRAFTS ANGIOGRAPHY;  Surgeon: Jettie Booze, MD;  Location: [place] [facility];  Service: Cardiovascular;  Laterality: N/A;  . LEFT HEART CATH AND CORS/GRAFTS ANGIOGRAPHY N/A 01/09/2020   Procedure: LEFT HEART CATH AND CORS/GRAFTS ANGIOGRAPHY;  Surgeon: Wellington Hampshire, MD;  Location: [facility];  Service: Cardiovascular;  Laterality: N/A;  . LEFT HEART CATHETERIZATION WITH CORONARY ANGIOGRAM N/A 10/23/2012   Procedure: LEFT HEART CATHETERIZATION WITH CORONARY ANGIOGRAM;  Surgeon: Jacolyn Reedy, MD;  Location: [facility] CATH LAB;  Service: Cardiovascular;  Laterality: N/A;  . PERMANENT PACEMAKER INSERTION N/A 10/23/2012   Procedure: PERMANENT PACEMAKER INSERTION;  Surgeon: Deboraha Sprang, MD;  Location: [facility] CATH LAB;  Service: Cardiovascular;  Laterality: N/A;  . PLANTAR FASCIA RELEASE Left    "& clipped a tendon that went thru bottom of my foot"  . TEMPORARY PACEMAKER INSERTION N/A 10/22/2012   Procedure: TEMPORARY PACEMAKER INSERTION;  Surgeon: Troy Sine, MD;  Location: [facility] CATH LAB;  Service: Cardiovascular;  Laterality: N/A;  . TUBAL LIGATION      Current Outpatient Medications  Medication Sig Dispense Refill  . ACCU-CHEK FASTCLIX LANCETS MISC USE  TO CHECK BLOOD SUGAR FOUR TIMES DAILY 408 each 2  . ACCU-CHEK SMARTVIEW test strip USE   TO CHECK BLOOD SUGAR FOUR TIMES DAILY 400 each 1  . Alcohol Swabs (B-D SINGLE USE SWABS REGULAR) PADS Use 8 daily for testing blood sugar and insulin injections. 800 each 1  . aspirin 81 MG tablet Take 1 tablet (81 mg total) by mouth daily. 30 tablet 11  . BD VEO INSULIN SYRINGE U/F 31G X 15/64" 1 ML MISC USE 10 TIMES A DAY WITH INSULIN AS DIRECTED 300 each 0  . beta carotene w/minerals (OCUVITE) tablet Take 1 tablet by mouth daily.    . Blood Glucose Calibration (ACCU-CHEK SMARTVIEW CONTROL) LIQD Use to calibrate blood glucose meter. 3 each 1  . cetirizine (ZYRTEC) 10 MG tablet Take 1 tablet (10 mg total) by mouth daily.    . cholecalciferol (VITAMIN D) 1000 UNITS tablet Take 1,000 Units by mouth daily.     . clopidogrel (PLAVIX) 75 MG tablet Take 1 tablet (75  mg total) by mouth daily. 90 tablet 1  . Continuous Blood Gluc Sensor (FREESTYLE LIBRE 14 DAY SENSOR) MISC APPLY EVERY 14 DAYS 2 each 0  . furosemide (LASIX) 40 MG tablet TAKE 2 TABLETS BY MOUTH EVERY MORNING AND 1 TABLET EVERY EVENING. TAKE 1 ADDITIONAL TABLET FOR WEIGHT GAIN OF 3LBS OR MORE 360 tablet 0  . insulin glargine (LANTUS) 100 UNIT/ML injection Inject 0.2 mLs (20 Units total) into the skin at bedtime. 10 mL 11  . Insulin Pen Needle (BD PEN NEEDLE NANO U/F) 32G X 4 MM MISC 1 each by Does not apply route 3 (three) times daily. Use to inject insulin TID; E11.40 100 each 2  . ketoconazole (NIZORAL) 2 % cream Apply 1 application topically daily as needed for irritation. TO AFFECTED AREAS OF FACE     . metoprolol succinate (TOPROL-XL) 25 MG 24 hr tablet Take 3 tablets (75 mg total) by mouth daily. Take with or immediately following a meal. 90 tablet 0  . nitroGLYCERIN (NITROSTAT) 0.4 MG SL tablet Place 0.4 mg under the tongue every 5 (five) minutes as needed for chest pain.     . potassium chloride SA (KLOR-CON M20) 20 MEQ tablet Take 1 tablet (20 mEq total) by mouth daily. 180 tablet 2  . Probiotic Product (PROBIOTIC DAILY) CAPS Take 1  capsule by mouth daily.    . psyllium (METAMUCIL) 58.6 % powder Take 1 packet by mouth daily as needed (fiber).     . rosuvastatin (CRESTOR) 40 MG tablet Take 1 tablet (40 mg total) by mouth daily. 30 tablet 0  . sacubitril-valsartan (ENTRESTO) 24-26 MG Take 1 tablet by mouth 2 (two) times daily. 60 tablet 0  . vitamin C (ASCORBIC ACID) 500 MG tablet Take 500 mg by mouth every other day.     No current facility-administered medications for this visit.    Allergies:   Brilinta [ticagrelor], Statins, Tricor [fenofibrate], Latex, and Tape   Social History:  The patient  reports that she has never smoked. She has never used smokeless tobacco. She reports that she does not drink alcohol or use drugs.   Family History:  The patient's family history includes Diabetes in her father, mother, and another family member; Heart disease in her father and another family member.  ROS:  Please see the history of present illness.   All other systems are reviewed and otherwise negative.   PHYSICAL EXAM: *** VS:  There were no vitals taken for this visit. BMI: There is no height or weight on file to calculate BMI. Well nourished, well developed, in no acute distress  HEENT: normocephalic, atraumatic  Neck: no JVD, carotid bruits or masses Cardiac:  ***RRR; no significant murmurs, no rubs, or gallops Lungs:  *** CTA b/l, no wheezing, rhonchi or rales  Abd: soft, nontender MS: no deformity or *** atrophy Ext: *** no edema  Skin: warm and dry, no rash Neuro:  No gross deficits appreciated Psych: euthymic mood, full affect  *** PPM site is stable, no tethering or discomfort   EKG:  Not done today  PPM interrogation done today and reviewed by myself: ***   01/09/2020: LHC/PCI  Mid RCA lesion is 100% stenosed.  Ost RCA to Prox RCA lesion is 50% stenosed.  Previously placed Prox Graft stent (unknown type) is widely patent.  Mid Graft lesion is 65% stenosed.  Origin lesion is 100%  stenosed.  LIMA and is normal in caliber.  The graft exhibits no disease.  Mid LAD to  Dist LAD lesion is 80% stenosed.  Origin to Prox Graft lesion is 95% stenosed.  Post intervention, there is a 10% residual stenosis.  Balloon angioplasty was performed using a BALLOON [place] EMERGE MR 3.0X20.  Dist LM lesion is 80% stenosed.  Ost Cx to Prox Cx lesion is 100% stenosed.  Ost LAD to Prox LAD lesion is 100% stenosed.   1.  Severe underlying three-vessel coronary artery disease with occluded native vessels. 2.  Known chronically occluded SVG to right PDA.  LIMA to LAD is patent.  However, there is chronic diffuse disease in the mid and distal LAD.  SVG to diagonal is patent with no significant in-stent restenosis.  However, there is a borderline 60 to 70% lesion after the stent.  SVG to OM is patent with severe 95% in-stent restenosis proximally which is the likely culprit for myocardial infarction. 3.  Left ventricular angiography was not performed.  EF was 30 to 35% by echo.  Mildly elevated left ventricular end-diastolic pressure at 16 mmHg. 4.  Successful balloon angioplasty of SVG to OM for severe in-stent restenosis.  Recommendations: This was a very difficult and prolonged procedure from the left radial artery given inability to engage the SVG to OM.  There is significant tortuosity in the left subclavian artery.  I had to switch to a femoral approach. Avoid left radial catheterization in the future. Continue dual antiplatelet therapy indefinitely if possible. Aggressive treatment of risk factors. The lesion in the SVG to diagonal can likely be monitored for now.  PCI can be considered if she has residual angina.    01/09/2020: TTE IMPRESSIONS  1. Left ventricular ejection fraction, by estimation, is 30 to 35%. The  left ventricle has moderately decreased function. The left ventricle has  no regional wall motion abnormalities. The left ventricular internal  cavity size was  mildly dilated. Left  ventricular diastolic parameters are consistent with Grade II diastolic  dysfunction (pseudonormalization). Elevated left ventricular end-diastolic  pressure. There is akinesis of the left ventricular, entire inferior wall,  inferolateral wall and apical  segment.  2. Right ventricular systolic function is normal. The right ventricular  size is normal. There is normal pulmonary artery systolic pressure.  3. Left atrial size was mildly dilated.  4. The mitral valve is normal in structure. Mild mitral valve  regurgitation. No evidence of mitral stenosis.  5. The aortic valve is tricuspid. Aortic valve regurgitation is not  visualized. Mild aortic valve sclerosis is present, with no evidence of  aortic valve stenosis.  6. The inferior vena cava is normal in size with greater than 50%  respiratory variability, suggesting right atrial pressure of 3 mmHg.      Recent Labs: 01/08/2020: B Natriuretic Peptide 655.6 01/12/2020: ALT 20; BUN 21; Creatinine, Ser 0.92; Hemoglobin 13.5; Platelets 185; Potassium 3.3; Sodium 136  01/11/2020: Cholesterol 168; HDL 38; LDL Cholesterol 100; Total CHOL/HDL Ratio 4.4; Triglycerides 151; VLDL 30   CrCl cannot be calculated (Patient's most recent lab result is older than the maximum 21 days allowed.).   Wt Readings from Last 3 Encounters:  01/22/20 179 lb 3.2 oz (81.3 kg)  01/12/20 185 lb 3.2 oz (84 kg)  04/13/19 196 lb 12.8 oz (89.3 kg)     Other studies reviewed: Additional studies/records reviewed today include: summarized above  ASSESSMENT AND PLAN:  1. ICD     CRT-D     ***  2. Paroxysmal Afib     CHA2DS2Vasc is 8, *** not on a/c 2/2  low burden (in review of Dr. Joya Gaskins note)     *** burden  3. CAD     ***  4. CM (presumed NICM)     Suspect 2/2 RV pacing, PPM >> CRT-D     ***  5.  HTN     ***   Disposition: F/u with ***  Current medicines are reviewed at length with the patient today.  The patient did  not have any concerns regarding medicines.***  Signed, Tommye Standard, PA-C 02/05/2020 9:26 PM     [address] [place] [address] [phone] (office)  [phone] (fax)

## 2020-02-06 ENCOUNTER — Encounter: Payer: Medicare HMO | Admitting: Physician Assistant

## 2020-02-12 ENCOUNTER — Ambulatory Visit (INDEPENDENT_AMBULATORY_CARE_PROVIDER_SITE_OTHER): Payer: Medicare HMO

## 2020-02-12 DIAGNOSIS — Z95810 Presence of automatic (implantable) cardiac defibrillator: Secondary | ICD-10-CM

## 2020-02-12 DIAGNOSIS — I5022 Chronic systolic (congestive) heart failure: Secondary | ICD-10-CM

## 2020-02-12 NOTE — Telephone Encounter (Signed)
No response from pt regarding CR.  Closed referral.  

## 2020-02-12 NOTE — Addendum Note (Signed)
Addended by: [name] on: 02/12/2020 09:43 AM   Modules accepted: Level of Service

## 2020-02-13 ENCOUNTER — Telehealth: Payer: Self-pay

## 2020-02-13 ENCOUNTER — Ambulatory Visit (INDEPENDENT_AMBULATORY_CARE_PROVIDER_SITE_OTHER): Payer: Medicare HMO | Admitting: *Deleted

## 2020-02-13 DIAGNOSIS — I442 Atrioventricular block, complete: Secondary | ICD-10-CM

## 2020-02-13 LAB — CUP PACEART REMOTE DEVICE CHECK
Battery Remaining Longevity: 37 mo
Battery Remaining Percentage: 45 %
Battery Voltage: 2.92 V
Brady Statistic AP VP Percent: 9.5 %
Brady Statistic AP VS Percent: 1 %
Brady Statistic AS VP Percent: 89 %
Brady Statistic AS VS Percent: 1 %
Brady Statistic RA Percent Paced: 8.4 %
Date Time Interrogation Session: 20210531020016
HighPow Impedance: 61 Ohm
HighPow Impedance: 61 Ohm
Implantable Lead Implant Date: 20140210
Implantable Lead Implant Date: 20170222
Implantable Lead Implant Date: 20170222
Implantable Lead Location: 753858
Implantable Lead Location: 753859
Implantable Lead Location: 753860
Implantable Lead Model: 4298
Implantable Lead Model: 5076
Implantable Lead Model: 7122
Implantable Pulse Generator Implant Date: 20170222
Lead Channel Impedance Value: 340 Ohm
Lead Channel Impedance Value: 390 Ohm
Lead Channel Impedance Value: 450 Ohm
Lead Channel Pacing Threshold Amplitude: 0.75 V
Lead Channel Pacing Threshold Amplitude: 0.75 V
Lead Channel Pacing Threshold Amplitude: 0.75 V
Lead Channel Pacing Threshold Pulse Width: 0.5 ms
Lead Channel Pacing Threshold Pulse Width: 0.5 ms
Lead Channel Pacing Threshold Pulse Width: 0.5 ms
Lead Channel Sensing Intrinsic Amplitude: 12 mV
Lead Channel Sensing Intrinsic Amplitude: 3.1 mV
Lead Channel Setting Pacing Amplitude: 2 V
Lead Channel Setting Pacing Amplitude: 2 V
Lead Channel Setting Pacing Amplitude: 2 V
Lead Channel Setting Pacing Pulse Width: 0.5 ms
Lead Channel Setting Pacing Pulse Width: 0.5 ms
Lead Channel Setting Sensing Sensitivity: 0.5 mV
Pulse Gen Serial Number: 7341405

## 2020-02-13 NOTE — Telephone Encounter (Signed)
Remote ICM transmission received.  Attempted call to patient regarding ICM remote transmission and mail box is full. 

## 2020-02-13 NOTE — Progress Notes (Signed)
EPIC Encounter for ICM Monitoring  Patient Name: Laurie [name] is a 74 y.o. female Date: 02/13/2020 Primary Care Physican: Haywood Pao, MD Primary Cardiologist:Klein Electrophysiologist:Klein Bi-V Pacing:98% 5/18/2021Weight:178lbs 02/01/2020 Weight: 171 lbs   Attempted call to patient and unable to reach. Transmission reviewed.   Unsure if patient resumed Lasix  CorVueThoracic impedancereturned to normal but was suggesting possible fluid accumulation from 01/26/2020 (Lasix on hold since that date) - 02/10/2020.  Prescribed:  Furosemide40 mg take 2 tablets (80 mg total) by mouth every morning and 1 tablet (40 mg total) every evening. Take extra tablet as needed for weight gain of 3 lbs or more.   Potassium 20 mEq take 1 tablet daily.  Labs:   01/30/2020 Creatinine 1.2,   BUN 7,   Potassium 3.8, Sodium 133, GFR 44-53.3 01/12/2020 Creatinine 0.92, BUN 21, Potassium 3.3, Sodium 136, GFR >60 01/11/2020 Creatinine 0.85, BUN 22, Potassium 3.7, Sodium 135, GFR >60 01/10/2020 Creatinine 0.84, BUN 24, Potassium 3.4, Sodium 137, GFR >60 01/09/2020 Creatinine1.02, BUN21, Potassium3.6, [id number], GFR55->60 01/08/2020 Potassium7.4, Sodium133  01/08/2020 Creatinine1.00, BUN17, Potassium5.5, Sodium138, GFR56->60 A complete set of results can be found in Results Review.  Recommendations:Unable to reach.    Follow-up plan: ICM clinic phone appointment on7/08/2020. 91 day device clinic remote transmission8/30/2021. Office visit withRenee Neldon Mc 02/20/2020.  Copy of ICM check sent to [place] [facility].  3 month ICM trend: 02/12/2020    1 Year ICM trend:       [name], RN 02/13/2020 1:54 PM

## 2020-02-18 NOTE — Progress Notes (Signed)
Cardiology Office Note Date:  02/20/2020  Patient ID:  Laurie [name], Laurie [name] [birth year], MRN [id number] PCP:  Haywood Pao, MD  Cardiologist:  Dr. Bettina Gavia EP: Dr. [name]     Chief Complaint:  2 week f/u  History of Present Illness: Laurie [name] is a 74 y.o. female with history of CAD (CABG 2000 >> 12/2019 POBA to SVG OM), bells Palsy, HTN, HLD, CM, chronic CHF (systolic), AFib, stroke (left with chronic balance problems), OSA (uses CPAP), DM, CHB  She was hospitalized with 01/08/20 with sepsis/pneumonia, resp failure.  Trop were elevated suspect demand though did undergo LHC w/PCI w/POBA.  LVEF 30-35% ACE transitioned to [facility].  Mentions waxing/waning AMS (reported by family occasionally at home as well).  Noted her DM poorly controlled.  Poor family and particualry patient knowledge on meds and regime.  Planned fo close PMD and cardiology follow up.  Discharged 01/12/20  She comes in today to be seen for Dr. [name].  He saw her 01/22/20, she was post hospital stay, had PCI to an SVG and diuresed.  He noted labs with elevated Creat, AKI.  She was apparently instructed by the PMD office previously to hold lasix but had not.  Dr. [name] advised her to hold the lasix, Entresto, and aldactone until after f/u labs were done (scheduled for that week it seems)  He also apparently reached out to the PMD to f/u on CXR for pneumonia..  There is mention of BB intolerance, with baseline sinus rates about 100(this may be historical)  CorVue monitoring with L. Short, RN noted worsening volume status, pending labs from [place] last corvue looked better   TODAY She is accompanied today by her daughter who is trying to get her parents' medical care and medicines caught up and straightened out. As far as we can tell, the patient may still be taking the Spironolactone, she has a hand written note that they think was via the PMD office  "keep taking entresto and spironolactone, tale furosemide  40mg  one tablet daily"  The patient however feels well.  Her daughter mentions this is the best she has been in quite a while, better energy, better stamina. The patient denies any CP, palpitations or cardiac awareness.  No rest SOB, some DOE with more exertional activities though not with casual paced walking.  She also thinks her balance and gait is better, not as reliant on her cane. No falls No dizzy spells, near syncope or syncope.  She reports CPAP compliance "almost always"    LABS 02/01/20 from her PMD BUN/Creat 7/1.2 K+ 3.8    Device information SJM CRT-D 11/05/2015 (RV/LV leads 2017), RA lead 2014 Has both MDT and SJM leads He has an abandoned RV pacing lead   Past Medical History:  Diagnosis Date  . AICD (automatic cardioverter/defibrillator) present   . Bell's palsy 01/05/2010  . CAD (coronary artery disease)    Cath  07/21/1999  mild ostial, 80% stenosis proximal LAD, 90% stenosis proximal Diag 1, 95% stenosis proximal OM 1, 80% stenosis proximal RCA, 95% stenosis mid RCA    CABG 07/22/99  LIMA to LAD, SVG to dx, SVG to OM, SVG to PDA  Dr. [name]   Cardiolite 2012 no ischemia EF 67%  Cath 10/23/12 Severe native three-vessel coronary artery disease with occlusion of all 3 native coronary arteries, Patent sap  . Chronic lower back pain   . Chronic systolic CHF (congestive heart failure) ([place]) 10/22/2015  . Cystitis   .  Hepatitis 1970s   "don't know which" (03/17/2017)  . HIstory of Bell's palsy    October 2012   . Hyperlipidemia    Intolerance to several statins   . Hypertensive heart disease without CHF   . Hypoxia 01/09/2020  . Multinodular goiter   . Obesity (BMI 30-39.9)   . OSA on CPAP    "suppose to wear a mask" (03/17/2017)  . Peripheral neuropathy   . Pneumonia 1990s X 1   "think i had walking pneumonia"  . Seasonal allergies   . Stroke ([place]) 03/2017   "light one"; denies residual on 03/17/2017)  . Type II diabetes mellitus ([place])     Past Surgical History:   Procedure Laterality Date  . BI-VENTRICULAR PACEMAKER UPGRADE  11/05/2015   "upgraded my pacemaker"  . BIOPSY THYROID  05/2010   percutaneous  . CARDIAC CATHETERIZATION N/A 10/21/2015   Procedure: Left Heart Cath and Cors/Grafts Angiography;  Surgeon: Belva Crome, MD;  Location: [facility];  Service: Cardiovascular;  Laterality: N/A;  . CARPAL TUNNEL RELEASE Bilateral   . CATARACT EXTRACTION Right 01/06/2015  . CATARACT EXTRACTION W/ INTRAOCULAR LENS IMPLANT Left 12/16/2014  . CORONARY ARTERY BYPASS GRAFT  2000  . CORONARY BALLOON ANGIOPLASTY N/A 01/09/2020   Procedure: CORONARY BALLOON ANGIOPLASTY;  Surgeon: Wellington Hampshire, MD;  Location: [place] CV LAB;  Service: Cardiovascular;  Laterality: N/A;  . CORONARY STENT INTERVENTION N/A 10/28/2017   Procedure: CORONARY STENT INTERVENTION;  Surgeon: Jettie Booze, MD;  Location: [place] CV LAB;  Service: Cardiovascular;  Laterality: N/A;  . CYSTECTOMY Right    "middle finger"  . DENTAL IMPLANTS    . EP IMPLANTABLE DEVICE N/A 11/05/2015   Procedure: BiV Upgrade;  Surgeon: Deboraha Sprang, MD;  Location: [place] CV LAB;  Service: Cardiovascular;  Laterality: N/A;  . INSERT / REPLACE / REMOVE PACEMAKER    . LEAD REVISION N/A 10/25/2012   Procedure: LEAD REVISION;  Surgeon: Evans Lance, MD;  Location: [facility] CATH LAB;  Service: Cardiovascular;  Laterality: N/A;  . LEFT HEART CATH AND CORS/GRAFTS ANGIOGRAPHY N/A 10/28/2017   Procedure: LEFT HEART CATH AND CORS/GRAFTS ANGIOGRAPHY;  Surgeon: Jettie Booze, MD;  Location: [place] CV LAB;  Service: Cardiovascular;  Laterality: N/A;  . LEFT HEART CATH AND CORS/GRAFTS ANGIOGRAPHY N/A 01/09/2020   Procedure: LEFT HEART CATH AND CORS/GRAFTS ANGIOGRAPHY;  Surgeon: Wellington Hampshire, MD;  Location: [place] [facility] LAB;  Service: Cardiovascular;  Laterality: N/A;  . LEFT HEART CATHETERIZATION WITH CORONARY ANGIOGRAM N/A 10/23/2012   Procedure: LEFT HEART CATHETERIZATION WITH  CORONARY ANGIOGRAM;  Surgeon: Jacolyn Reedy, MD;  Location: [facility] CATH LAB;  Service: Cardiovascular;  Laterality: N/A;  . PERMANENT PACEMAKER INSERTION N/A 10/23/2012   Procedure: PERMANENT PACEMAKER INSERTION;  Surgeon: Deboraha Sprang, MD;  Location: [facility] CATH LAB;  Service: Cardiovascular;  Laterality: N/A;  . PLANTAR FASCIA RELEASE Left    "& clipped a tendon that went thru bottom of my foot"  . TEMPORARY PACEMAKER INSERTION N/A 10/22/2012   Procedure: TEMPORARY PACEMAKER INSERTION;  Surgeon: Troy Sine, MD;  Location: [facility] CATH LAB;  Service: Cardiovascular;  Laterality: N/A;  . TUBAL LIGATION      Current Outpatient Medications  Medication Sig Dispense Refill  . ACCU-CHEK FASTCLIX LANCETS MISC USE  TO CHECK BLOOD SUGAR FOUR TIMES DAILY 408 each 2  . ACCU-CHEK SMARTVIEW test strip USE  TO CHECK BLOOD SUGAR FOUR TIMES DAILY 400 each 1  . Alcohol  Swabs (B-D SINGLE USE SWABS REGULAR) PADS Use 8 daily for testing blood sugar and insulin injections. 800 each 1  . aspirin 81 MG tablet Take 1 tablet (81 mg total) by mouth daily. 30 tablet 11  . BD VEO INSULIN SYRINGE U/F 31G X 15/64" 1 ML MISC USE 10 TIMES A DAY WITH INSULIN AS DIRECTED 300 each 0  . beta carotene w/minerals (OCUVITE) tablet Take 1 tablet by mouth daily.    . Blood Glucose Calibration (ACCU-CHEK SMARTVIEW CONTROL) LIQD Use to calibrate blood glucose meter. 3 each 1  . cetirizine (ZYRTEC) 10 MG tablet Take 1 tablet (10 mg total) by mouth daily.    . cholecalciferol (VITAMIN D) 1000 UNITS tablet Take 1,000 Units by mouth daily.     . clopidogrel (PLAVIX) 75 MG tablet Take 1 tablet (75 mg total) by mouth daily. 90 tablet 1  . Continuous Blood Gluc Sensor (FREESTYLE LIBRE 14 DAY SENSOR) MISC APPLY EVERY 14 DAYS 2 each 0  . furosemide (LASIX) 40 MG tablet TAKE 2 TABLETS BY MOUTH EVERY MORNING AND 1 TABLET EVERY EVENING. TAKE 1 ADDITIONAL TABLET FOR WEIGHT GAIN OF 3LBS OR MORE 360 tablet 0  . insulin glargine (LANTUS) 100 UNIT/ML  injection Inject 0.2 mLs (20 Units total) into the skin at bedtime. 10 mL 11  . Insulin Pen Needle (BD PEN NEEDLE NANO U/F) 32G X 4 MM MISC 1 each by Does not apply route 3 (three) times daily. Use to inject insulin TID; E11.40 100 each 2  . ketoconazole (NIZORAL) 2 % cream Apply 1 application topically daily as needed for irritation. TO AFFECTED AREAS OF FACE     . nitroGLYCERIN (NITROSTAT) 0.4 MG SL tablet Place 0.4 mg under the tongue every 5 (five) minutes as needed for chest pain.     . potassium chloride SA (KLOR-CON M20) 20 MEQ tablet Take 1 tablet (20 mEq total) by mouth daily. 180 tablet 2  . Probiotic Product (PROBIOTIC DAILY) CAPS Take 1 capsule by mouth daily.    . psyllium (METAMUCIL) 58.6 % powder Take 1 packet by mouth daily as needed (fiber).     . rosuvastatin (CRESTOR) 40 MG tablet Take 1 tablet (40 mg total) by mouth daily. 30 tablet 0  . sacubitril-valsartan (ENTRESTO) 24-26 MG Take 1 tablet by mouth 2 (two) times daily. 60 tablet 0  . vitamin C (ASCORBIC ACID) 500 MG tablet Take 500 mg by mouth every other day.    . metoprolol succinate (TOPROL-XL) 25 MG 24 hr tablet Take 3 tablets (75 mg total) by mouth daily. Take with or immediately following a meal. 90 tablet 0   No current facility-administered medications for this visit.    Allergies:   Brilinta [ticagrelor], Statins, Tricor [fenofibrate], Latex, and Tape   Social History:  The patient  reports that she has never smoked. She has never used smokeless tobacco. She reports that she does not drink alcohol or use drugs.   Family History:  The patient's family history includes Diabetes in her father, mother, and another family member; Heart disease in her father and another family member.  ROS:  Please see the history of present illness.   All other systems are reviewed and otherwise negative.   PHYSICAL EXAM:  VS:  BP (!) 126/54   Pulse 98   Ht 5\' 2"  (1.575 m)   Wt 193 lb (87.5 kg)   BMI 35.30 kg/m  BMI: Body mass  index is 35.3 kg/m. Well nourished, well developed, in  no acute distress  HEENT: normocephalic, atraumatic  Neck: no JVD, carotid bruits or masses Cardiac:  RRR; no significant murmurs, no rubs, or gallops Lungs:  CTA b/l, no wheezing, rhonchi or rales  Abd: soft, nontender MS: no deformity, advance atrophy Ext: trace if ane edema.  She has a very superficial wound on her R LE, on her SVG harvest scar.  No surrounding erythema, no bleeding or drainage.  Mentions has been there "for a bit", had a scar but the bandage rips it off.  Approx 3cm long, 2-34mm wide, mid shin Skin: warm and dry, no rash Neuro:  No gross deficits appreciated Psych: euthymic mood, full affect  PPM site is stable, no tethering or discomfort   EKG:  Not done today  PPM interrogation done today and reviewed by myself: Battery and lead measurements are good Not enough R waves today at 40bpm to get R wave 99%BP no VT 2 AMS both are Aflutter, both 4 seconds duration    01/09/2020: LHC/PCI  Mid RCA lesion is 100% stenosed.  Ost RCA to Prox RCA lesion is 50% stenosed.  Previously placed Prox Graft stent (unknown type) is widely patent.  Mid Graft lesion is 65% stenosed.  Origin lesion is 100% stenosed.  LIMA and is normal in caliber.  The graft exhibits no disease.  Mid LAD to Dist LAD lesion is 80% stenosed.  Origin to Prox Graft lesion is 95% stenosed.  Post intervention, there is a 10% residual stenosis.  Balloon angioplasty was performed using a BALLOON [place] EMERGE MR 3.0X20.  Dist LM lesion is 80% stenosed.  Ost Cx to Prox Cx lesion is 100% stenosed.  Ost LAD to Prox LAD lesion is 100% stenosed.   1.  Severe underlying three-vessel coronary artery disease with occluded native vessels. 2.  Known chronically occluded SVG to right PDA.  LIMA to LAD is patent.  However, there is chronic diffuse disease in the mid and distal LAD.  SVG to diagonal is patent with no significant in-stent  restenosis.  However, there is a borderline 60 to 70% lesion after the stent.  SVG to OM is patent with severe 95% in-stent restenosis proximally which is the likely culprit for myocardial infarction. 3.  Left ventricular angiography was not performed.  EF was 30 to 35% by echo.  Mildly elevated left ventricular end-diastolic pressure at 16 mmHg. 4.  Successful balloon angioplasty of SVG to OM for severe in-stent restenosis.  Recommendations: This was a very difficult and prolonged procedure from the left radial artery given inability to engage the SVG to OM.  There is significant tortuosity in the left subclavian artery.  I had to switch to a femoral approach. Avoid left radial catheterization in the future. Continue dual antiplatelet therapy indefinitely if possible. Aggressive treatment of risk factors. The lesion in the SVG to diagonal can likely be monitored for now.  PCI can be considered if she has residual angina.    01/09/2020: TTE IMPRESSIONS  1. Left ventricular ejection fraction, by estimation, is 30 to 35%. The  left ventricle has moderately decreased function. The left ventricle has  no regional wall motion abnormalities. The left ventricular internal  cavity size was mildly dilated. Left  ventricular diastolic parameters are consistent with Grade II diastolic  dysfunction (pseudonormalization). Elevated left ventricular end-diastolic  pressure. There is akinesis of the left ventricular, entire inferior wall,  inferolateral wall and apical  segment.  2. Right ventricular systolic function is normal. The right ventricular  size  is normal. There is normal pulmonary artery systolic pressure.  3. Left atrial size was mildly dilated.  4. The mitral valve is normal in structure. Mild mitral valve  regurgitation. No evidence of mitral stenosis.  5. The aortic valve is tricuspid. Aortic valve regurgitation is not  visualized. Mild aortic valve sclerosis is present, with no  evidence of  aortic valve stenosis.  6. The inferior vena cava is normal in size with greater than 50%  respiratory variability, suggesting right atrial pressure of 3 mmHg.      Recent Labs: 01/08/2020: B Natriuretic Peptide 655.6 01/12/2020: ALT 20; BUN 21; Creatinine, Ser 0.92; Hemoglobin 13.5; Platelets 185; Potassium 3.3; Sodium 136  01/11/2020: Cholesterol 168; HDL 38; LDL Cholesterol 100; Total CHOL/HDL Ratio 4.4; Triglycerides 151; VLDL 30   CrCl cannot be calculated (Patient's most recent lab result is older than the maximum 21 days allowed.).   Wt Readings from Last 3 Encounters:  02/20/20 193 lb (87.5 kg)  01/22/20 179 lb 3.2 oz (81.3 kg)  01/12/20 185 lb 3.2 oz (84 kg)     Other studies reviewed: Additional studies/records reviewed today include: summarized above  ASSESSMENT AND PLAN:  1. ICD     CRT-D     Intact function, no programming changes made  2. Paroxysmal Afib     CHA2DS2Vasc is 8, not on a/c 2/2 low burden (in review of Dr. Joya Gaskins last available note)     <1% AMS burden  3. CAD     Most recent intervention, 12/2019 POBA to SVG OM     No symptoms     On ASA, plavix, BB, statin     No longer sees Dr. Bettina Gavia  4. CM (presumed NICM)     Suspect 2/2 RV pacing, PPM >> CRT-D     corVue is above threshold     Exam does not suggest volume OL currently     On BB, entresto, diuretics       Suspect we do not know exactly how she is taking her HF meds currently daughter will call with a clear and updated list of medicines as of today BMET today   5.  HTN      Looks good   Disposition: F/u with L. Short, RN, remotes Q 49months and Dr. Belva Chimes APP in 2 mo, sooner if needed.   Current medicines are reviewed at length with the patient today.  The patient did not have any concerns regarding medicines.    Venetia Night, PA-C 02/20/2020 2:36 PM     [address] [place] [address] [phone] (office)   [phone] (fax)

## 2020-02-19 NOTE — Progress Notes (Signed)
Remote ICD transmission.   

## 2020-02-20 ENCOUNTER — Ambulatory Visit (INDEPENDENT_AMBULATORY_CARE_PROVIDER_SITE_OTHER): Payer: Medicare HMO | Admitting: Physician Assistant

## 2020-02-20 ENCOUNTER — Other Ambulatory Visit: Payer: Self-pay

## 2020-02-20 VITALS — BP 126/54 | HR 98 | Ht 62.0 in | Wt 193.0 lb

## 2020-02-20 DIAGNOSIS — I480 Paroxysmal atrial fibrillation: Secondary | ICD-10-CM | POA: Diagnosis not present

## 2020-02-20 DIAGNOSIS — Z95810 Presence of automatic (implantable) cardiac defibrillator: Secondary | ICD-10-CM

## 2020-02-20 DIAGNOSIS — I429 Cardiomyopathy, unspecified: Secondary | ICD-10-CM | POA: Diagnosis not present

## 2020-02-20 DIAGNOSIS — I5022 Chronic systolic (congestive) heart failure: Secondary | ICD-10-CM | POA: Diagnosis not present

## 2020-02-20 DIAGNOSIS — I2510 Atherosclerotic heart disease of native coronary artery without angina pectoris: Secondary | ICD-10-CM | POA: Diagnosis not present

## 2020-02-20 DIAGNOSIS — E871 Hypo-osmolality and hyponatremia: Secondary | ICD-10-CM | POA: Diagnosis not present

## 2020-02-20 DIAGNOSIS — N179 Acute kidney failure, unspecified: Secondary | ICD-10-CM | POA: Diagnosis not present

## 2020-02-20 DIAGNOSIS — I428 Other cardiomyopathies: Secondary | ICD-10-CM

## 2020-02-20 DIAGNOSIS — I130 Hypertensive heart and chronic kidney disease with heart failure and stage 1 through stage 4 chronic kidney disease, or unspecified chronic kidney disease: Secondary | ICD-10-CM | POA: Diagnosis not present

## 2020-02-20 DIAGNOSIS — I509 Heart failure, unspecified: Secondary | ICD-10-CM | POA: Diagnosis not present

## 2020-02-20 NOTE — Patient Instructions (Signed)
Medication Instructions:   Your physician recommends that you continue on your current medications as directed. Please refer to the Current Medication list given to you today. *If you need a refill on your cardiac medications before your next appointment, please call your pharmacy*   Lab Work: BMET  TODAY    If you have labs (blood work) drawn today and your tests are completely normal, you will receive your results only by: [name] MyChart Message (if you have MyChart) OR . A paper copy in the mail If you have any lab test that is abnormal or we need to change your treatment, we will call you to review the results.   Testing/Procedures: NONE ORDERED  TODAY   Follow-Up: At [facility], you and your health needs are our priority.  As part of our continuing mission to provide you with exceptional heart care, we have created designated Provider Care Teams.  These Care Teams include your primary Cardiologist (physician) and Advanced Practice Providers (APPs -  Physician Assistants and Nurse Practitioners) who all work together to provide you with the care you need, when you need it.  We recommend signing up for the patient portal called "MyChart".  Sign up information is provided on this After Visit Summary.  MyChart is used to connect with patients for Virtual Visits (Telemedicine).  Patients are able to view lab/test results, encounter notes, upcoming appointments, etc.  Non-urgent messages can be sent to your provider as well.   To learn more about what you can do with MyChart, go to [email or web address].    Your next appointment:   2 month(s)  The format for your next appointment:   In Person  Provider:   You may see Dr [name]  or one of the following Advanced Practice Providers on your designated Care Team:    Chanetta Marshall, NP  Tommye Standard, PA-C  Legrand Como "Oda [place], [place]    Other Instructions

## 2020-02-21 LAB — BASIC METABOLIC PANEL
BUN/Creatinine Ratio: 24 (ref 12–28)
BUN: 21 mg/dL (ref 8–27)
CO2: 23 mmol/L (ref 20–29)
Calcium: 9.6 mg/dL (ref 8.7–10.3)
Chloride: 98 mmol/L (ref 96–106)
Creatinine, Ser: 0.88 mg/dL (ref 0.57–1.00)
GFR calc Af Amer: 75 mL/min/{1.73_m2} (ref >59)
GFR calc non Af Amer: 65 mL/min/{1.73_m2} (ref >59)
Glucose: 400 mg/dL — ABNORMAL HIGH (ref 65–99)
Potassium: 4.3 mmol/L (ref 3.5–5.2)
Sodium: 138 mmol/L (ref 134–144)

## 2020-02-22 ENCOUNTER — Telehealth: Payer: Self-pay

## 2020-02-22 ENCOUNTER — Other Ambulatory Visit: Payer: Self-pay

## 2020-02-22 NOTE — Telephone Encounter (Signed)
Katie the patient's daughter is calling back to finish previous conversation with Legrand Como.

## 2020-02-22 NOTE — Telephone Encounter (Signed)
The patient and her daughter have been notified of the lab result and verbalized understanding.  All questions (if any) were answered. Frederik Schmidt, RN 02/22/2020 12:40 PM   They will reach out to PCP for advisement.

## 2020-02-22 NOTE — Telephone Encounter (Signed)
I spoke to the patient and her daughter in regards to her medication list.  They were on their way out to lunch and will call me later to review list.

## 2020-02-22 NOTE — Telephone Encounter (Signed)
-----   Message from [facility], [place] sent at 02/22/2020 10:16 AM EDT ----- Same pt I sent note on previously, though did not realize her PMD had done labs as well (that I previously sent a note on.)  We also did labs, kidney function and K+ are good.  No changes to her diuretics, please confirm medicine list. Her sugar on her PMD labs was 288.  Please ensure they are keeping an eye at home and that it is better and very important to see her PMD for close monitoring/management of her diabetes.

## 2020-02-22 NOTE — Telephone Encounter (Signed)
I spoke to the patient's daughter and reviewed the patient's medication list.  Everything matched up on her list, with our present list, except she is no longer taking Lasix.

## 2020-02-22 NOTE — Telephone Encounter (Signed)
I tried calling patient to get daughter's phone number to review medications, but no answer and mailbox full. 6/11

## 2020-02-22 NOTE — Telephone Encounter (Signed)
I spoke with the patient @ [phone] and she told me that her daughter Valetta Fuller) would be there after lunch.  I told her that I would call then to review labs/medications.  She verbalized understanding.

## 2020-02-22 NOTE — Telephone Encounter (Signed)
Reviewed Spironolactone medication with patient's daughter and she said that the patient is no longer taking.  The medication list is now up to date.  6/11

## 2020-03-10 ENCOUNTER — Ambulatory Visit (INDEPENDENT_AMBULATORY_CARE_PROVIDER_SITE_OTHER): Payer: Medicare HMO

## 2020-03-10 DIAGNOSIS — I5022 Chronic systolic (congestive) heart failure: Secondary | ICD-10-CM

## 2020-03-10 DIAGNOSIS — Z95810 Presence of automatic (implantable) cardiac defibrillator: Secondary | ICD-10-CM

## 2020-03-12 ENCOUNTER — Telehealth: Payer: Self-pay

## 2020-03-12 NOTE — Progress Notes (Signed)
EPIC Encounter for ICM Monitoring  Patient Name: Laurie [name] is a 74 y.o. female Date: 03/12/2020 Primary Care Physican: Haywood Pao, MD Primary Cardiologist:Klein Electrophysiologist:Klein Bi-V Pacing:99% 02/01/2020 Weight: 171 lbs    Attempted call to patient and unable to reach.  Transmission reviewed.   CorVueThoracic impedancenormal but was suggesting possible fluid accumulation from 6/19-6/26.  Prescribed:  Potassium 20 mEq take 1 tablet daily.  Labs: 01/30/2020 Creatinine 1.2,   BUN 7,   Potassium 3.8, Sodium 133, GFR 44-53.3 01/12/2020 Creatinine 0.92, BUN 21, Potassium 3.3, Sodium 136, GFR >60 01/11/2020 Creatinine 0.85, BUN 22, Potassium 3.7, Sodium 135, GFR >60 01/10/2020 Creatinine 0.84, BUN 24, Potassium 3.4, Sodium 137, GFR >60 01/09/2020 Creatinine1.02, BUN21, Potassium3.6, [id number], GFR55->60 01/08/2020 Potassium7.4, Sodium133  01/08/2020 Creatinine1.00, BUN17, Potassium5.5, Sodium138, GFR56->60 A complete set of results can be found in Results Review.  Recommendations:Unable to reach.    Follow-up plan: ICM clinic phone appointment on7/08/2020. 91 day device clinic remote transmission8/30/2021.  EP/Cardiology Office Visits:   Copy of ICM check sent to [place].  3 month ICM trend: 03/10/2020    1 Year ICM trend:       [name], RN 03/12/2020 9:09 AM

## 2020-03-12 NOTE — Telephone Encounter (Signed)
Remote ICM transmission received.  Attempted call to patient regarding ICM remote transmission and no answer.  Mail box is full. 

## 2020-03-13 ENCOUNTER — Ambulatory Visit: Payer: Medicare HMO | Admitting: Neurology

## 2020-03-13 ENCOUNTER — Encounter: Payer: Self-pay | Admitting: Neurology

## 2020-03-13 VITALS — BP 151/87 | HR 90 | Ht 62.0 in | Wt 183.0 lb

## 2020-03-13 DIAGNOSIS — I639 Cerebral infarction, unspecified: Secondary | ICD-10-CM | POA: Diagnosis not present

## 2020-03-13 DIAGNOSIS — R413 Other amnesia: Secondary | ICD-10-CM | POA: Diagnosis not present

## 2020-03-13 DIAGNOSIS — R269 Unspecified abnormalities of gait and mobility: Secondary | ICD-10-CM | POA: Diagnosis not present

## 2020-03-13 NOTE — Progress Notes (Signed)
HISTORICAL  Laurie [name] is a 74 year old female, seen in request by her primary care physician Dr. Domenick Gong for evaluation of memory loss, she is accompanied by her daughter Laurie [name] at today's visit on March 13, 2020.  I reviewed and summarized the referring note. She had a past medical history of CAD, status post pacemaker placement HTN HLD DM- insulin  She has been a homemaker, lives at home with her husband, she was admitted to hospital in May 2021 for few days history of worsening memory loss, confusion, with decreased p.o. intake, worsening low back pain, radiating to lower extremity, on January 08, 2020, her husband awoke to loud noises around 3 AM, found patient in the living room pinned against the wall, what appeared to have been that patient fell near the couch, she seems to have struck her head on the wall,  Patient was noted to be hypoxic into the 70s upon arrival, was treated with nonrebreather, was noted to have hypokalemia, elevated troponin, BMP, and mild elevated CPK, , treated with Lasix, chest x-ray showed marked large right-sided ossification, EKG showed ventricular paced tachycardia, she was diagnosed with acute hypoxic respiratory failure in the setting of sepsis, community-acquired pneumonia, she also had cardiac catheter, balloon angioplasty, echo showed decreased ejection fraction 30 to 35%,  Patient mental status was noted waxing and waning during her hospital stay, patient's family confirmed, over the past few years, she had intermittent confusion, slow decline in functional status, there was also concern of confusion about her insulin dose, poorly controlled diabetes, A1c was 9.8  Daughter confirmed today, patient was noted to have slow decline in functional status especially since January 2021, she repeat herself, forget doctor's appointment, cannot keep up with her medications, especially insulin shots.  She was also noted to have gradual worsening gait  abnormalities.    Laboratory evaluations in May 2021, CMP showed elevated glucose 328, creatinine of 2.3, CMP showed normal hemoglobin of 16.8,  I personally reviewed MRI of the brain in April 2021, no acute abnormality, generalized atrophy, moderate supratentorium small vessel disease, progressed since 2018.  REVIEW OF SYSTEMS: Full 14 system review of systems performed and notable only for as above All other review of systems were negative.   ALLERGIES: Allergies  Allergen Reactions  . Brilinta [Ticagrelor] Shortness Of Breath  . Statins Other (See Comments)    Statins make the legs ache  . Tricor [Fenofibrate] Other (See Comments)    Leg pain  . Latex Itching and Other (See Comments)    Affected areas turn red, also-  . Tape Rash    Heart leads break out the skin    HOME MEDICATIONS: Current Outpatient Medications  Medication Sig Dispense Refill  . ACCU-CHEK FASTCLIX LANCETS MISC USE  TO CHECK BLOOD SUGAR FOUR TIMES DAILY 408 each 2  . ACCU-CHEK SMARTVIEW test strip USE  TO CHECK BLOOD SUGAR FOUR TIMES DAILY 400 each 1  . Alcohol Swabs (B-D SINGLE USE SWABS REGULAR) PADS Use 8 daily for testing blood sugar and insulin injections. 800 each 1  . aspirin 81 MG tablet Take 1 tablet (81 mg total) by mouth daily. 30 tablet 11  . BD VEO INSULIN SYRINGE U/F 31G X 15/64" 1 ML MISC USE 10 TIMES A DAY WITH INSULIN AS DIRECTED 300 each 0  . beta carotene w/minerals (OCUVITE) tablet Take 1 tablet by mouth daily.    . Blood Glucose Calibration (ACCU-CHEK SMARTVIEW CONTROL) LIQD Use to calibrate blood glucose meter. 3 each  1  . cetirizine (ZYRTEC) 10 MG tablet Take 1 tablet (10 mg total) by mouth daily.    . cholecalciferol (VITAMIN D) 1000 UNITS tablet Take 1,000 Units by mouth daily.     . clopidogrel (PLAVIX) 75 MG tablet Take 1 tablet (75 mg total) by mouth daily. 90 tablet 1  . Continuous Blood Gluc Sensor (FREESTYLE LIBRE 14 DAY SENSOR) MISC APPLY EVERY 14 DAYS 2 each 0  . insulin  glargine (LANTUS) 100 UNIT/ML injection Inject 0.2 mLs (20 Units total) into the skin at bedtime. 10 mL 11  . Insulin Pen Needle (BD PEN NEEDLE NANO U/F) 32G X 4 MM MISC 1 each by Does not apply route 3 (three) times daily. Use to inject insulin TID; E11.40 100 each 2  . ketoconazole (NIZORAL) 2 % cream Apply 1 application topically daily as needed for irritation. TO AFFECTED AREAS OF FACE     . nitroGLYCERIN (NITROSTAT) 0.4 MG SL tablet Place 0.4 mg under the tongue every 5 (five) minutes as needed for chest pain.     . potassium chloride SA (KLOR-CON M20) 20 MEQ tablet Take 1 tablet (20 mEq total) by mouth daily. 180 tablet 2  . Probiotic Product (PROBIOTIC DAILY) CAPS Take 1 capsule by mouth daily.    . psyllium (METAMUCIL) 58.6 % powder Take 1 packet by mouth daily as needed (fiber).     . rosuvastatin (CRESTOR) 40 MG tablet Take 1 tablet (40 mg total) by mouth daily. 30 tablet 0  . sacubitril-valsartan (ENTRESTO) 24-26 MG Take 1 tablet by mouth 2 (two) times daily. 60 tablet 0  . vitamin C (ASCORBIC ACID) 500 MG tablet Take 500 mg by mouth every other day.    . metoprolol succinate (TOPROL-XL) 25 MG 24 hr tablet Take 3 tablets (75 mg total) by mouth daily. Take with or immediately following a meal. 90 tablet 0   No current facility-administered medications for this visit.    PAST MEDICAL HISTORY: Past Medical History:  Diagnosis Date  . AICD (automatic cardioverter/defibrillator) present   . Bell's palsy 01/05/2010  . CAD (coronary artery disease)    Cath  07/21/1999  mild ostial, 80% stenosis proximal LAD, 90% stenosis proximal Diag 1, 95% stenosis proximal OM 1, 80% stenosis proximal RCA, 95% stenosis mid RCA    CABG 07/22/99  LIMA to LAD, SVG to dx, SVG to OM, SVG to PDA  Dr. [name]   Cardiolite 2012 no ischemia EF 67%  Cath 10/23/12 Severe native three-vessel coronary artery disease with occlusion of all 3 native coronary arteries, Patent sap  . Chronic lower back pain   . Chronic  systolic CHF (congestive heart failure) ([place]) 10/22/2015  . Cystitis   . Gait abnormality   . Hepatitis 1970s   "don't know which" (03/17/2017)  . HIstory of Bell's palsy    October 2012   . Hyperlipidemia    Intolerance to several statins   . Hypertensive heart disease without CHF   . Hypoxia 01/09/2020  . Memory loss   . Multinodular goiter   . Obesity (BMI 30-39.9)   . OSA on CPAP    "suppose to wear a mask" (03/17/2017)  . Peripheral neuropathy   . Pneumonia 1990s X 1   "think i had walking pneumonia"  . Seasonal allergies   . Stroke ([place]) 03/2017   "light one"; denies residual on 03/17/2017)  . Type II diabetes mellitus ([place])     PAST SURGICAL HISTORY: Past Surgical History:  Procedure Laterality Date  .  BI-VENTRICULAR PACEMAKER UPGRADE  11/05/2015   "upgraded my pacemaker"  . BIOPSY THYROID  05/2010   percutaneous  . CARDIAC CATHETERIZATION N/A 10/21/2015   Procedure: Left Heart Cath and Cors/Grafts Angiography;  Surgeon: Belva Crome, MD;  Location: [facility];  Service: Cardiovascular;  Laterality: N/A;  . CARPAL TUNNEL RELEASE Bilateral   . CATARACT EXTRACTION Right 01/06/2015  . CATARACT EXTRACTION W/ INTRAOCULAR LENS IMPLANT Left 12/16/2014  . CORONARY ARTERY BYPASS GRAFT  2000  . CORONARY BALLOON ANGIOPLASTY N/A 01/09/2020   Procedure: CORONARY BALLOON ANGIOPLASTY;  Surgeon: Wellington Hampshire, MD;  Location: [facility];  Service: Cardiovascular;  Laterality: N/A;  . CORONARY STENT INTERVENTION N/A 10/28/2017   Procedure: CORONARY STENT INTERVENTION;  Surgeon: Jettie Booze, MD;  Location: [facility];  Service: Cardiovascular;  Laterality: N/A;  . CYSTECTOMY Right    "middle finger"  . DENTAL IMPLANTS    . EP IMPLANTABLE DEVICE N/A 11/05/2015   Procedure: BiV Upgrade;  Surgeon: Deboraha Sprang, MD;  Location: [facility];  Service: Cardiovascular;  Laterality: N/A;  . INSERT / REPLACE / REMOVE PACEMAKER    . LEAD REVISION N/A 10/25/2012     Procedure: LEAD REVISION;  Surgeon: Evans Lance, MD;  Location: [facility] CATH LAB;  Service: Cardiovascular;  Laterality: N/A;  . LEFT HEART CATH AND CORS/GRAFTS ANGIOGRAPHY N/A 10/28/2017   Procedure: LEFT HEART CATH AND CORS/GRAFTS ANGIOGRAPHY;  Surgeon: Jettie Booze, MD;  Location: [facility];  Service: Cardiovascular;  Laterality: N/A;  . LEFT HEART CATH AND CORS/GRAFTS ANGIOGRAPHY N/A 01/09/2020   Procedure: LEFT HEART CATH AND CORS/GRAFTS ANGIOGRAPHY;  Surgeon: Wellington Hampshire, MD;  Location: [facility];  Service: Cardiovascular;  Laterality: N/A;  . LEFT HEART CATHETERIZATION WITH CORONARY ANGIOGRAM N/A 10/23/2012   Procedure: LEFT HEART CATHETERIZATION WITH CORONARY ANGIOGRAM;  Surgeon: Jacolyn Reedy, MD;  Location: [facility] CATH LAB;  Service: Cardiovascular;  Laterality: N/A;  . PERMANENT PACEMAKER INSERTION N/A 10/23/2012   Procedure: PERMANENT PACEMAKER INSERTION;  Surgeon: Deboraha Sprang, MD;  Location: [facility] CATH LAB;  Service: Cardiovascular;  Laterality: N/A;  . PLANTAR FASCIA RELEASE Left    "& clipped a tendon that went thru bottom of my foot"  . TEMPORARY PACEMAKER INSERTION N/A 10/22/2012   Procedure: TEMPORARY PACEMAKER INSERTION;  Surgeon: Troy Sine, MD;  Location: [facility] CATH LAB;  Service: Cardiovascular;  Laterality: N/A;  . TUBAL LIGATION      FAMILY HISTORY: Family History  Problem Relation Age of Onset  . Diabetes Father   . Heart disease Father   . Diabetes Mother   . Diabetes Other        Siblings and 2 children  . Heart disease Other        CAD  . Colon cancer Neg Hx   . Colon polyps Neg Hx     SOCIAL HISTORY: Social History   Socioeconomic History  . Marital status: Married    Spouse name: Not on file  . Number of children: 2  . Years of education: 2  . Highest education level: High school graduate  Occupational History    Employer: RETIRED  Tobacco Use  . Smoking status: Never Smoker  . Smokeless tobacco: Never Used  Vaping  Use  . Vaping Use: Never used  Substance and Sexual Activity  . Alcohol use: No    Alcohol/week: 0.0 standard drinks  . Drug use: No  . Sexual activity:  Not Currently  Other Topics Concern  . Not on file  Social History Narrative   Lives at home with her husband.   Right-handed.   One cup caffeine per day.   Social Determinants of Health   Financial Resource Strain:   . Difficulty of Paying Living Expenses:   Food Insecurity:   . Worried About Charity fundraiser in the Last Year:   . Arboriculturist in the Last Year:   Transportation Needs:   . Film/video editor (Medical):   [name] Lack of Transportation (Non-Medical):   Physical Activity:   . Days of Exercise per Week:   . Minutes of Exercise per Session:   Stress:   . Feeling of Stress :   Social Connections:   . Frequency of Communication with Friends and Family:   . Frequency of Social Gatherings with Friends and Family:   . Attends Religious Services:   . Active Member of Clubs or Organizations:   . Attends Archivist Meetings:   [name] Marital Status:   Intimate Partner Violence:   . Fear of Current or Ex-Partner:   . Emotionally Abused:   [name] Physically Abused:   . Sexually Abused:      PHYSICAL EXAM   Vitals:   03/13/20 1111  BP: (!) 151/87  Pulse: 90  Weight: 183 lb (83 kg)  Height: 5\' 2"  (1.575 m)   Not recorded     Body mass index is 33.47 kg/m.  PHYSICAL EXAMNIATION:  Gen: NAD, conversant, well nourised, well groomed                     Cardiovascular: Regular rate rhythm, no peripheral edema, warm, nontender. Eyes: Conjunctivae clear without exudates or hemorrhage Neck: Supple, no carotid bruits. Pulmonary: Clear to auscultation bilaterally   NEUROLOGICAL EXAM:  MENTAL STATUS:  MMSE - Mini Mental State Exam 03/13/2020  Orientation to time 4  Orientation to Place 5  Registration 3  Attention/ Calculation 2  Recall 0  Language- name 2 objects 2  Language- repeat 1  Language-  follow 3 step command 3  Language- read & follow direction 1  Write a sentence 1  Copy design 1  Total score 23  Animal naming 5   CRANIAL NERVES: CN II: Visual fields are full to confrontation. Pupils are round equal and briskly reactive to light. CN III, IV, VI: extraocular movement are normal. No ptosis. CN V: Facial sensation is intact to light touch CN VII: Face is symmetric with normal eye closure  CN VIII: Hearing is normal to causal conversation. CN IX, X: Phonation is normal. CN XI: Head turning and shoulder shrug are intact  MOTOR: There is no pronator drift of out-stretched arms. Muscle bulk and tone are normal. Muscle strength is normal.  REFLEXES: Reflexes are 2+ and symmetric at the biceps, triceps, knees, and ankles. Plantar responses are flexor.  SENSORY: Dense dependent sensory changes  COORDINATION: There is no trunk or limb dysmetria noted.  GAIT/STANCE: Need push-up to get up from seated position, unsteady Romberg is absent.   DIAGNOSTIC DATA (LABS, IMAGING, TESTING) - I reviewed patient records, labs, notes, testing and imaging myself where available.   ASSESSMENT AND PLAN  Laurie [name] is a 74 y.o. female   Dementia Gait abnormality  Laboratory evaluation to rule out treatable etiology  Referral to physical therapy  EEG for reported intermittent worsening confusion spells   Marcial Pacas, M.D. Ph.D.  [facility]  [place], [address], [place] [address] Ph: [phone] Fax: [phone]  CC:  Tisovec, Fransico Him, MD [place],  [place] [id number]  Tisovec, Fransico Him, MD

## 2020-03-14 LAB — THYROID PANEL WITH TSH
Free Thyroxine Index: 2.2 (ref 1.2–4.9)
T3 Uptake Ratio: 25 % (ref 24–39)
T4, Total: 8.8 ug/dL (ref 4.5–12.0)
TSH: 0.86 u[IU]/mL (ref 0.450–4.500)

## 2020-03-14 LAB — RPR: RPR Ser Ql: NONREACTIVE

## 2020-03-14 LAB — VITAMIN B12: Vitamin B-12: 474 pg/mL (ref 232–1245)

## 2020-03-24 ENCOUNTER — Ambulatory Visit (INDEPENDENT_AMBULATORY_CARE_PROVIDER_SITE_OTHER): Payer: Medicare HMO

## 2020-03-24 DIAGNOSIS — Z95810 Presence of automatic (implantable) cardiac defibrillator: Secondary | ICD-10-CM | POA: Diagnosis not present

## 2020-03-24 DIAGNOSIS — I5022 Chronic systolic (congestive) heart failure: Secondary | ICD-10-CM | POA: Diagnosis not present

## 2020-03-26 ENCOUNTER — Telehealth: Payer: Self-pay

## 2020-03-26 DIAGNOSIS — G9341 Metabolic encephalopathy: Secondary | ICD-10-CM | POA: Diagnosis not present

## 2020-03-26 DIAGNOSIS — E1151 Type 2 diabetes mellitus with diabetic peripheral angiopathy without gangrene: Secondary | ICD-10-CM | POA: Diagnosis not present

## 2020-03-26 DIAGNOSIS — E669 Obesity, unspecified: Secondary | ICD-10-CM | POA: Diagnosis not present

## 2020-03-26 DIAGNOSIS — I428 Other cardiomyopathies: Secondary | ICD-10-CM | POA: Diagnosis not present

## 2020-03-26 DIAGNOSIS — R2689 Other abnormalities of gait and mobility: Secondary | ICD-10-CM | POA: Diagnosis not present

## 2020-03-26 DIAGNOSIS — I5022 Chronic systolic (congestive) heart failure: Secondary | ICD-10-CM | POA: Diagnosis not present

## 2020-03-26 DIAGNOSIS — I130 Hypertensive heart and chronic kidney disease with heart failure and stage 1 through stage 4 chronic kidney disease, or unspecified chronic kidney disease: Secondary | ICD-10-CM | POA: Diagnosis not present

## 2020-03-26 DIAGNOSIS — E1165 Type 2 diabetes mellitus with hyperglycemia: Secondary | ICD-10-CM | POA: Diagnosis not present

## 2020-03-26 DIAGNOSIS — Z1331 Encounter for screening for depression: Secondary | ICD-10-CM | POA: Diagnosis not present

## 2020-03-26 DIAGNOSIS — I480 Paroxysmal atrial fibrillation: Secondary | ICD-10-CM | POA: Diagnosis not present

## 2020-03-26 NOTE — Telephone Encounter (Signed)
Remote ICM transmission received.  Attempted call to patient regarding ICM remote transmission and no answer.  Mail box is full. 

## 2020-03-26 NOTE — Progress Notes (Signed)
EPIC Encounter for ICM Monitoring  Patient Name: Laurie [name] is a 74 y.o. female Date: 03/26/2020 Primary Care Physican: Haywood Pao, MD Primary Cardiologist:Klein Electrophysiologist:Klein Bi-V Pacing:99% 02/01/2020 Weight: 171 lbs    Attempted call to patient and unable to reach.  Transmission reviewed.   CorVueThoracic impedancetrending slightly below baseline normal.  Prescribed:  Potassium 20 mEq take 1 tablet daily.  Labs: 02/20/2020 Creatinine 0.88, BUN 24, Potassium 4.3, Sodium 138, GFR 65-75 01/30/2020 Creatinine 1.2, BUN 7, Potassium 3.8, Sodium 133, GFR 44-53.3 01/12/2020 Creatinine 0.92, BUN 21, Potassium 3.3, Sodium 136, GFR >60 01/11/2020 Creatinine 0.85, BUN 22, Potassium 3.7, Sodium 135, GFR >60 01/10/2020 Creatinine 0.84, BUN 24, Potassium 3.4, Sodium 137, GFR >60 01/09/2020 Creatinine1.02, BUN21, Potassium3.6, [id number], GFR55->60 01/08/2020 Potassium7.4, Sodium133  01/08/2020 Creatinine1.00, BUN17, Potassium5.5, Sodium138, GFR56->60 A complete set of results can be found in Results Review.  Recommendations:Unable to reach.    Follow-up plan: ICM clinic phone appointment on8/16/2021. 91 day device clinic remote transmission8/30/2021.  EP/Cardiology Office Visits: Recall for 04/20/2020 with Oda Kilts or Tommye Standard PA.    Copy of ICM check sent to Dr. [name].   3 month ICM trend: 03/24/2020    1 Year ICM trend:       [name], RN 03/26/2020 2:54 PM

## 2020-04-23 ENCOUNTER — Ambulatory Visit (INDEPENDENT_AMBULATORY_CARE_PROVIDER_SITE_OTHER): Payer: Medicare HMO | Admitting: Neurology

## 2020-04-23 DIAGNOSIS — R413 Other amnesia: Secondary | ICD-10-CM

## 2020-04-24 NOTE — Procedures (Signed)
° °  HISTORY: 74 year old female, presented with acute worsening memory loss, confusion  TECHNIQUE:  This is a routine 16 channel EEG recording with one channel devoted to a limited EKG recording.  It was performed during wakefulness, drowsiness and asleep.  Hyperventilation and photic stimulation were performed as activating procedures.  There are minimum muscle and movement artifact noted.  Upon maximum arousal, posterior dominant waking rhythm consistent of mildly dysrhythmic theta range activity, 7 Hz. Activities are symmetric over the bilateral posterior derivations and attenuated with eye opening.  Hyperventilation produced mild/moderate buildup with higher amplitude and the slower activities noted.  Photic stimulation did not alter the tracing.  During EEG recording, patient developed drowsiness and no deeper stage of sleep was achieved  During EEG recording, there was no epileptiform discharge noted.  EKG demonstrate sinus rhythm, with heart rate of 88 bpm  CONCLUSION: This is a mild abnormal EEG.  There is evidence of mild background slowing, indicating mild bihemispheric malfunction. There was no evidence of epileptiform discharge.  Marcial Pacas, M.D. Ph.D.  [facility] [place], [place] [address] Phone: [phone] Fax:      [phone]

## 2020-04-28 ENCOUNTER — Ambulatory Visit (INDEPENDENT_AMBULATORY_CARE_PROVIDER_SITE_OTHER): Payer: Medicare HMO

## 2020-04-28 DIAGNOSIS — I5022 Chronic systolic (congestive) heart failure: Secondary | ICD-10-CM

## 2020-04-28 DIAGNOSIS — Z95810 Presence of automatic (implantable) cardiac defibrillator: Secondary | ICD-10-CM

## 2020-04-30 ENCOUNTER — Telehealth: Payer: Self-pay

## 2020-04-30 NOTE — Progress Notes (Signed)
EPIC Encounter for ICM Monitoring  Patient Name: Laurie [name] is a 74 y.o. female Date: 04/30/2020 Primary Care Physican: Haywood Pao, MD Primary Cardiologist:Klein Electrophysiologist:Klein Bi-V Pacing:99% 02/01/2020 Weight: 171 lbs    Attempted call to patient and unable to reach. Transmission reviewed.  CorVueThoracic impedancenormal.  Prescribed:  Potassium 20 mEq take 1 tablet daily.  Labs: 02/20/2020 Creatinine 0.88, BUN 24, Potassium 4.3, Sodium 138, GFR 65-75 01/30/2020 Creatinine 1.2, BUN 7, Potassium 3.8, Sodium 133, GFR 44-53.3 01/12/2020 Creatinine 0.92, BUN 21, Potassium 3.3, Sodium 136, GFR >60 01/11/2020 Creatinine 0.85, BUN 22, Potassium 3.7, Sodium 135, GFR >60 01/10/2020 Creatinine 0.84, BUN 24, Potassium 3.4, Sodium 137, GFR >60 01/09/2020 Creatinine1.02, BUN21, Potassium3.6, [id number], GFR55->60 01/08/2020 Potassium7.4, Sodium133  01/08/2020 Creatinine1.00, BUN17, Potassium5.5, Sodium138, GFR56->60 A complete set of results can be found in Results Review.  Recommendations:Unable to reach.  Follow-up plan: ICM clinic phone appointment on9/20/2021. 91 day device clinic remote transmission9/09/2019.  EP/Cardiology Office Visits: Recall for 04/20/2020 with Oda Kilts or Tommye Standard PA.    Copy of ICM check sent to Dr. [name].   3 month ICM trend: 04/28/2020    1 Year ICM trend:       [name], RN 04/30/2020 4:39 PM

## 2020-04-30 NOTE — Telephone Encounter (Signed)
Remote ICM transmission received.  Attempted call to patient regarding ICM remote transmission and mail box is full. 

## 2020-05-09 ENCOUNTER — Telehealth: Payer: Self-pay

## 2020-05-09 NOTE — Telephone Encounter (Signed)
Spoke to the patient today about getting eye exam here in the office and she stated she is already scheduled to have it done at her eye doctor

## 2020-05-13 DIAGNOSIS — I130 Hypertensive heart and chronic kidney disease with heart failure and stage 1 through stage 4 chronic kidney disease, or unspecified chronic kidney disease: Secondary | ICD-10-CM | POA: Diagnosis not present

## 2020-05-13 DIAGNOSIS — E261 Secondary hyperaldosteronism: Secondary | ICD-10-CM | POA: Diagnosis not present

## 2020-05-13 DIAGNOSIS — Z794 Long term (current) use of insulin: Secondary | ICD-10-CM | POA: Diagnosis not present

## 2020-05-13 DIAGNOSIS — E669 Obesity, unspecified: Secondary | ICD-10-CM | POA: Diagnosis not present

## 2020-05-13 DIAGNOSIS — E1140 Type 2 diabetes mellitus with diabetic neuropathy, unspecified: Secondary | ICD-10-CM | POA: Diagnosis not present

## 2020-05-13 DIAGNOSIS — I482 Chronic atrial fibrillation, unspecified: Secondary | ICD-10-CM | POA: Diagnosis not present

## 2020-05-13 DIAGNOSIS — I2510 Atherosclerotic heart disease of native coronary artery without angina pectoris: Secondary | ICD-10-CM | POA: Diagnosis not present

## 2020-05-13 DIAGNOSIS — I5022 Chronic systolic (congestive) heart failure: Secondary | ICD-10-CM | POA: Diagnosis not present

## 2020-05-13 DIAGNOSIS — Z6832 Body mass index (BMI) 32.0-32.9, adult: Secondary | ICD-10-CM | POA: Diagnosis not present

## 2020-05-14 ENCOUNTER — Ambulatory Visit (INDEPENDENT_AMBULATORY_CARE_PROVIDER_SITE_OTHER): Payer: Medicare HMO | Admitting: *Deleted

## 2020-05-14 DIAGNOSIS — I442 Atrioventricular block, complete: Secondary | ICD-10-CM

## 2020-05-15 LAB — CUP PACEART REMOTE DEVICE CHECK
Battery Remaining Longevity: 35 mo
Battery Remaining Percentage: 41 %
Battery Voltage: 2.92 V
Brady Statistic AP VP Percent: 5.4 %
Brady Statistic AP VS Percent: 1 %
Brady Statistic AS VP Percent: 93 %
Brady Statistic AS VS Percent: 1 %
Brady Statistic RA Percent Paced: 4.8 %
Date Time Interrogation Session: 20210901020015
HighPow Impedance: 68 Ohm
HighPow Impedance: 68 Ohm
Implantable Lead Implant Date: 20140210
Implantable Lead Implant Date: 20170222
Implantable Lead Implant Date: 20170222
Implantable Lead Location: 753858
Implantable Lead Location: 753859
Implantable Lead Location: 753860
Implantable Lead Model: 4298
Implantable Lead Model: 5076
Implantable Lead Model: 7122
Implantable Pulse Generator Implant Date: 20170222
Lead Channel Impedance Value: 350 Ohm
Lead Channel Impedance Value: 400 Ohm
Lead Channel Impedance Value: 460 Ohm
Lead Channel Pacing Threshold Amplitude: 0.5 V
Lead Channel Pacing Threshold Amplitude: 0.625 V
Lead Channel Pacing Threshold Amplitude: 0.75 V
Lead Channel Pacing Threshold Pulse Width: 0.5 ms
Lead Channel Pacing Threshold Pulse Width: 0.5 ms
Lead Channel Pacing Threshold Pulse Width: 0.5 ms
Lead Channel Sensing Intrinsic Amplitude: 12 mV
Lead Channel Sensing Intrinsic Amplitude: 3.1 mV
Lead Channel Setting Pacing Amplitude: 2 V
Lead Channel Setting Pacing Amplitude: 2 V
Lead Channel Setting Pacing Amplitude: 2 V
Lead Channel Setting Pacing Pulse Width: 0.5 ms
Lead Channel Setting Pacing Pulse Width: 0.5 ms
Lead Channel Setting Sensing Sensitivity: 0.5 mV
Pulse Gen Serial Number: 7341405

## 2020-05-16 NOTE — Progress Notes (Signed)
Remote ICD transmission.   

## 2020-06-02 ENCOUNTER — Ambulatory Visit (INDEPENDENT_AMBULATORY_CARE_PROVIDER_SITE_OTHER): Payer: Medicare HMO

## 2020-06-02 DIAGNOSIS — Z95810 Presence of automatic (implantable) cardiac defibrillator: Secondary | ICD-10-CM | POA: Diagnosis not present

## 2020-06-02 DIAGNOSIS — I5022 Chronic systolic (congestive) heart failure: Secondary | ICD-10-CM | POA: Diagnosis not present

## 2020-06-04 NOTE — Progress Notes (Signed)
EPIC Encounter for ICM Monitoring  Patient Name: Laurie [name] is a 74 y.o. female Date: 06/04/2020 Primary Care Physican: Haywood Pao, MD Primary Cardiologist:Klein Electrophysiologist:Klein Bi-V Pacing:99% 02/01/2020 Weight: 171 lbs     Transmission reviewed.  CorVueThoracic impedancenormal.  Prescribed:  Potassium 20 mEq take 1 tablet daily.  Labs: 02/20/2020 Creatinine 0.88, BUN 24, Potassium 4.3, Sodium 138, GFR 65-75 01/30/2020 Creatinine 1.2, BUN 7, Potassium 3.8, Sodium 133, GFR 44-53.3 01/12/2020 Creatinine 0.92, BUN 21, Potassium 3.3, Sodium 136, GFR >60 01/11/2020 Creatinine 0.85, BUN 22, Potassium 3.7, Sodium 135, GFR >60 01/10/2020 Creatinine 0.84, BUN 24, Potassium 3.4, Sodium 137, GFR >60 01/09/2020 Creatinine1.02, BUN21, Potassium3.6, [id number], GFR55->60 01/08/2020 Potassium7.4, Sodium133  01/08/2020 Creatinine1.00, BUN17, Potassium5.5, Sodium138, GFR56->60 A complete set of results can be found in Results Review.  Recommendations:No changes  Follow-up plan: ICM clinic phone appointment on10/25/2021. 91 day device clinic remote transmission 08/13/2020.  EP/Cardiology Office Visits:Recall for 04/20/2020 withAndy Tillery or [name]. Message sent to EP scheduler 06/04/20 to call pt for appointment.  Copy of ICM check sent to [facility].    3 month ICM trend: 06/02/2020    1 Year ICM trend:       [name], RN 06/04/2020 9:30 AM

## 2020-06-11 DIAGNOSIS — Z794 Long term (current) use of insulin: Secondary | ICD-10-CM | POA: Diagnosis not present

## 2020-06-11 DIAGNOSIS — E1140 Type 2 diabetes mellitus with diabetic neuropathy, unspecified: Secondary | ICD-10-CM | POA: Diagnosis not present

## 2020-06-11 DIAGNOSIS — I130 Hypertensive heart and chronic kidney disease with heart failure and stage 1 through stage 4 chronic kidney disease, or unspecified chronic kidney disease: Secondary | ICD-10-CM | POA: Diagnosis not present

## 2020-06-11 DIAGNOSIS — I5022 Chronic systolic (congestive) heart failure: Secondary | ICD-10-CM | POA: Diagnosis not present

## 2020-06-16 ENCOUNTER — Ambulatory Visit: Payer: Medicare HMO | Admitting: Neurology

## 2020-06-16 ENCOUNTER — Encounter: Payer: Self-pay | Admitting: Neurology

## 2020-06-16 ENCOUNTER — Encounter: Payer: Self-pay | Admitting: Internal Medicine

## 2020-06-16 NOTE — Progress Notes (Deleted)
PATIENT: Laurie [name] DOB: 03/15/1946  REASON FOR VISIT: follow up HISTORY FROM: patient  HISTORY OF PRESENT ILLNESS: Today 06/16/20  HISTORY  Laurie [name] is a 74 year old female, seen in request by her primary care physician Dr. Domenick Gong for evaluation of memory loss, she is accompanied by her daughter Joellen Jersey at today's visit on March 13, 2020.  I reviewed and summarized the referring note. She had a past medical history of CAD, status post pacemaker placement HTN HLD DM- insulin  She has been a homemaker, lives at home with her husband, she was admitted to hospital in May 2021 for few days history of worsening memory loss, confusion, with decreased p.o. intake, worsening low back pain, radiating to lower extremity, on January 08, 2020, her husband awoke to loud noises around 3 AM, found patient in the living room pinned against the wall, what appeared to have been that patient fell near the couch, she seems to have struck her head on the wall,  Patient was noted to be hypoxic into the 70s upon arrival, was treated with nonrebreather, was noted to have hypokalemia, elevated troponin, BMP, and mild elevated CPK, , treated with Lasix, chest x-ray showed marked large right-sided ossification, EKG showed ventricular paced tachycardia, she was diagnosed with acute hypoxic respiratory failure in the setting of sepsis, community-acquired pneumonia, she also had cardiac catheter, balloon angioplasty, echo showed decreased ejection fraction 30 to 35%,  Patient mental status was noted waxing and waning during her hospital stay, patient's family confirmed, over the past few years, she had intermittent confusion, slow decline in functional status, there was also concern of confusion about her insulin dose, poorly controlled diabetes, A1c was 9.8  Daughter confirmed today, patient was noted to have slow decline in functional status especially since January 2021, she repeat herself,  forget doctor's appointment, cannot keep up with her medications, especially insulin shots.  She was also noted to have gradual worsening gait abnormalities.  Laboratory evaluations in May 2021, CMP showed elevated glucose 328, creatinine of 2.3, CMP showed normal hemoglobin of 16.8,  I personally reviewed MRI of the brain in April 2021, no acute abnormality, generalized atrophy, moderate supratentorium small vessel disease, progressed since 2018.  Update June 16, 2020 SS: EEG mildly abnormal, evidence of mild background slowing, indicating mild bihemispheric malfunction, no evidence of epileptiform discharge  REVIEW OF SYSTEMS: Out of a complete 14 system review of symptoms, the patient complains only of the following symptoms, and all other reviewed systems are negative.  ALLERGIES: Allergies  Allergen Reactions  . Brilinta [Ticagrelor] Shortness Of Breath  . Statins Other (See Comments)    Statins make the legs ache  . Tricor [Fenofibrate] Other (See Comments)    Leg pain  . Latex Itching and Other (See Comments)    Affected areas turn red, also-  . Tape Rash    Heart leads break out the skin    HOME MEDICATIONS: Outpatient Medications Prior to Visit  Medication Sig Dispense Refill  . ACCU-CHEK FASTCLIX LANCETS MISC USE  TO CHECK BLOOD SUGAR FOUR TIMES DAILY 408 each 2  . ACCU-CHEK SMARTVIEW test strip USE  TO CHECK BLOOD SUGAR FOUR TIMES DAILY 400 each 1  . Alcohol Swabs (B-D SINGLE USE SWABS REGULAR) PADS Use 8 daily for testing blood sugar and insulin injections. 800 each 1  . BD VEO INSULIN SYRINGE U/F 31G X 15/64" 1 ML MISC USE 10 TIMES A DAY WITH INSULIN AS DIRECTED 300 each 0  .  beta carotene w/minerals (OCUVITE) tablet Take 1 tablet by mouth daily.    . Blood Glucose Calibration (ACCU-CHEK SMARTVIEW CONTROL) LIQD Use to calibrate blood glucose meter. 3 each 1  . cetirizine (ZYRTEC) 10 MG tablet Take 1 tablet (10 mg total) by mouth daily.    . cholecalciferol (VITAMIN  D) 1000 UNITS tablet Take 1,000 Units by mouth daily.     . clopidogrel (PLAVIX) 75 MG tablet Take 1 tablet (75 mg total) by mouth daily. 90 tablet 1  . Continuous Blood Gluc Sensor (FREESTYLE LIBRE 14 DAY SENSOR) MISC APPLY EVERY 14 DAYS 2 each 0  . insulin glargine (LANTUS) 100 UNIT/ML injection Inject 0.2 mLs (20 Units total) into the skin at bedtime. 10 mL 11  . Insulin Pen Needle (BD PEN NEEDLE NANO U/F) 32G X 4 MM MISC 1 each by Does not apply route 3 (three) times daily. Use to inject insulin TID; E11.40 100 each 2  . ketoconazole (NIZORAL) 2 % cream Apply 1 application topically daily as needed for irritation. TO AFFECTED AREAS OF FACE     . metoprolol succinate (TOPROL-XL) 25 MG 24 hr tablet Take 3 tablets (75 mg total) by mouth daily. Take with or immediately following a meal. 90 tablet 0  . nitroGLYCERIN (NITROSTAT) 0.4 MG SL tablet Place 0.4 mg under the tongue every 5 (five) minutes as needed for chest pain.     . potassium chloride SA (KLOR-CON M20) 20 MEQ tablet Take 1 tablet (20 mEq total) by mouth daily. 180 tablet 2  . Probiotic Product (PROBIOTIC DAILY) CAPS Take 1 capsule by mouth daily.    . psyllium (METAMUCIL) 58.6 % powder Take 1 packet by mouth daily as needed (fiber).     . rosuvastatin (CRESTOR) 40 MG tablet Take 1 tablet (40 mg total) by mouth daily. 30 tablet 0  . sacubitril-valsartan (ENTRESTO) 24-26 MG Take 1 tablet by mouth 2 (two) times daily. 60 tablet 0  . vitamin C (ASCORBIC ACID) 500 MG tablet Take 500 mg by mouth every other day.     No facility-administered medications prior to visit.    PAST MEDICAL HISTORY: Past Medical History:  Diagnosis Date  . AICD (automatic cardioverter/defibrillator) present   . Bell's palsy 01/05/2010  . CAD (coronary artery disease)    Cath  07/21/1999  mild ostial, 80% stenosis proximal LAD, 90% stenosis proximal Diag 1, 95% stenosis proximal OM 1, 80% stenosis proximal RCA, 95% stenosis mid RCA    CABG 07/22/99  LIMA to LAD,  SVG to dx, SVG to OM, SVG to PDA  Dr. [name]   Cardiolite 2012 no ischemia EF 67%  Cath 10/23/12 Severe native three-vessel coronary artery disease with occlusion of all 3 native coronary arteries, Patent sap  . Chronic lower back pain   . Chronic systolic CHF (congestive heart failure) ([place]) 10/22/2015  . Cystitis   . Gait abnormality   . Hepatitis 1970s   "don't know which" (03/17/2017)  . HIstory of Bell's palsy    October 2012   . Hyperlipidemia    Intolerance to several statins   . Hypertensive heart disease without CHF   . Hypoxia 01/09/2020  . Memory loss   . Multinodular goiter   . Obesity (BMI 30-39.9)   . OSA on CPAP    "suppose to wear a mask" (03/17/2017)  . Peripheral neuropathy   . Pneumonia 1990s X 1   "think i had walking pneumonia"  . Seasonal allergies   . Stroke [facility]) 03/2017   "  light one"; denies residual on 03/17/2017)  . Type II diabetes mellitus ([place])     PAST SURGICAL HISTORY: Past Surgical History:  Procedure Laterality Date  . BI-VENTRICULAR PACEMAKER UPGRADE  11/05/2015   "upgraded my pacemaker"  . BIOPSY THYROID  05/2010   percutaneous  . CARDIAC CATHETERIZATION N/A 10/21/2015   Procedure: Left Heart Cath and Cors/Grafts Angiography;  Surgeon: Belva Crome, MD;  Location: [place] [facility];  Service: Cardiovascular;  Laterality: N/A;  . CARPAL TUNNEL RELEASE Bilateral   . CATARACT EXTRACTION Right 01/06/2015  . CATARACT EXTRACTION W/ INTRAOCULAR LENS IMPLANT Left 12/16/2014  . CORONARY ARTERY BYPASS GRAFT  2000  . CORONARY BALLOON ANGIOPLASTY N/A 01/09/2020   Procedure: CORONARY BALLOON ANGIOPLASTY;  Surgeon: Wellington Hampshire, MD;  Location: [place] [facility];  Service: Cardiovascular;  Laterality: N/A;  . CORONARY STENT INTERVENTION N/A 10/28/2017   Procedure: CORONARY STENT INTERVENTION;  Surgeon: Jettie Booze, MD;  Location: [place] [facility];  Service: Cardiovascular;  Laterality: N/A;  . CYSTECTOMY Right    "middle finger"  . DENTAL IMPLANTS     . EP IMPLANTABLE DEVICE N/A 11/05/2015   Procedure: BiV Upgrade;  Surgeon: Deboraha Sprang, MD;  Location: [place] [facility];  Service: Cardiovascular;  Laterality: N/A;  . INSERT / REPLACE / REMOVE PACEMAKER    . LEAD REVISION N/A 10/25/2012   Procedure: LEAD REVISION;  Surgeon: Evans Lance, MD;  Location: [facility] CATH LAB;  Service: Cardiovascular;  Laterality: N/A;  . LEFT HEART CATH AND CORS/GRAFTS ANGIOGRAPHY N/A 10/28/2017   Procedure: LEFT HEART CATH AND CORS/GRAFTS ANGIOGRAPHY;  Surgeon: Jettie Booze, MD;  Location: [place] [facility];  Service: Cardiovascular;  Laterality: N/A;  . LEFT HEART CATH AND CORS/GRAFTS ANGIOGRAPHY N/A 01/09/2020   Procedure: LEFT HEART CATH AND CORS/GRAFTS ANGIOGRAPHY;  Surgeon: Wellington Hampshire, MD;  Location: [place] [facility];  Service: Cardiovascular;  Laterality: N/A;  . LEFT HEART CATHETERIZATION WITH CORONARY ANGIOGRAM N/A 10/23/2012   Procedure: LEFT HEART CATHETERIZATION WITH CORONARY ANGIOGRAM;  Surgeon: Jacolyn Reedy, MD;  Location: [facility] CATH LAB;  Service: Cardiovascular;  Laterality: N/A;  . PERMANENT PACEMAKER INSERTION N/A 10/23/2012   Procedure: PERMANENT PACEMAKER INSERTION;  Surgeon: Deboraha Sprang, MD;  Location: [facility] CATH LAB;  Service: Cardiovascular;  Laterality: N/A;  . PLANTAR FASCIA RELEASE Left    "& clipped a tendon that went thru bottom of my foot"  . TEMPORARY PACEMAKER INSERTION N/A 10/22/2012   Procedure: TEMPORARY PACEMAKER INSERTION;  Surgeon: Troy Sine, MD;  Location: [facility] CATH LAB;  Service: Cardiovascular;  Laterality: N/A;  . TUBAL LIGATION      FAMILY HISTORY: Family History  Problem Relation Age of Onset  . Diabetes Father   . Heart disease Father   . Diabetes Mother   . Diabetes Other        Siblings and 2 children  . Heart disease Other        CAD  . Colon cancer Neg Hx   . Colon polyps Neg Hx     SOCIAL HISTORY: Social History   Socioeconomic History  . Marital status: Married    Spouse name: Not  on file  . Number of children: 2  . Years of education: 16  . Highest education level: High school graduate  Occupational History    Employer: RETIRED  Tobacco Use  . Smoking status: Never Smoker  . Smokeless tobacco: Never Used  Vaping Use  . Vaping Use:  Never used  Substance and Sexual Activity  . Alcohol use: No    Alcohol/week: 0.0 standard drinks  . Drug use: No  . Sexual activity: Not Currently  Other Topics Concern  . Not on file  Social History Narrative   Lives at home with her husband.   Right-handed.   One cup caffeine per day.   Social Determinants of Health   Financial Resource Strain:   . Difficulty of Paying Living Expenses: Not on file  Food Insecurity:   . Worried About Charity fundraiser in the Last Year: Not on file  . Ran Out of Food in the Last Year: Not on file  Transportation Needs:   . Lack of Transportation (Medical): Not on file  . Lack of Transportation (Non-Medical): Not on file  Physical Activity:   . Days of Exercise per Week: Not on file  . Minutes of Exercise per Session: Not on file  Stress:   . Feeling of Stress : Not on file  Social Connections:   . Frequency of Communication with Friends and Family: Not on file  . Frequency of Social Gatherings with Friends and Family: Not on file  . Attends Religious Services: Not on file  . Active Member of Clubs or Organizations: Not on file  . Attends Archivist Meetings: Not on file  . Marital Status: Not on file  Intimate Partner Violence:   . Fear of Current or Ex-Partner: Not on file  . Emotionally Abused: Not on file  . Physically Abused: Not on file  . Sexually Abused: Not on file      PHYSICAL EXAM  There were no vitals filed for this visit. There is no height or weight on file to calculate BMI.  Generalized: Well developed, in no acute distress   Neurological examination  Mentation: Alert oriented to time, place, history taking. Follows all commands speech and  language fluent Cranial nerve II-XII: Pupils were equal round reactive to light. Extraocular movements were full, visual field were full on confrontational test. Facial sensation and strength were normal. Uvula tongue midline. Head turning and shoulder shrug  were normal and symmetric. Motor: The motor testing reveals 5 over 5 strength of all 4 extremities. Good symmetric motor tone is noted throughout.  Sensory: Sensory testing is intact to soft touch on all 4 extremities. No evidence of extinction is noted.  Coordination: Cerebellar testing reveals good finger-nose-finger and heel-to-shin bilaterally.  Gait and station: Gait is normal. Tandem gait is normal. Romberg is negative. No drift is seen.  Reflexes: Deep tendon reflexes are symmetric and normal bilaterally.   DIAGNOSTIC DATA (LABS, IMAGING, TESTING) - I reviewed patient records, labs, notes, testing and imaging myself where available.  Lab Results  Component Value Date   WBC 7.2 01/12/2020   HGB 13.5 01/12/2020   HCT 40.6 01/12/2020   MCV 92.3 01/12/2020   PLT 185 01/12/2020      Component Value Date/Time   NA 138 02/20/2020 1305   K 4.3 02/20/2020 1305   CL 98 02/20/2020 1305   CO2 23 02/20/2020 1305   GLUCOSE 400 (H) 02/20/2020 1305   GLUCOSE 232 (H) 01/12/2020 0252   BUN 21 02/20/2020 1305   CREATININE 0.88 02/20/2020 1305   CALCIUM 9.6 02/20/2020 1305   PROT 6.0 (L) 01/12/2020 0252   ALBUMIN 2.6 (L) 01/12/2020 0252   AST 19 01/12/2020 0252   ALT 20 01/12/2020 0252   ALKPHOS 46 01/12/2020 0252   BILITOT 0.5 01/12/2020  [place] 02/20/2020 1305   GFRAA 75 02/20/2020 1305   Lab Results  Component Value Date   CHOL 168 01/11/2020   HDL 38 (L) 01/11/2020   LDLCALC 100 (H) 01/11/2020   LDLDIRECT 155.1 06/05/2012   TRIG 151 (H) 01/11/2020   CHOLHDL 4.4 01/11/2020   Lab Results  Component Value Date   HGBA1C 12.4 (H) 01/09/2020   Lab Results  Component Value Date   VITAMINB12 474 03/13/2020   Lab  Results  Component Value Date   TSH 0.860 03/13/2020      ASSESSMENT AND PLAN 74 y.o. year old female  has a past medical history of AICD (automatic cardioverter/defibrillator) present, Bell's palsy (01/05/2010), CAD (coronary artery disease), Chronic lower back pain, Chronic systolic CHF (congestive heart failure) ([place]) (10/22/2015), Cystitis, Gait abnormality, Hepatitis (1970s), HIstory of Bell's palsy, Hyperlipidemia, Hypertensive heart disease without CHF, Hypoxia (01/09/2020), Memory loss, Multinodular goiter, Obesity (BMI 30-39.9), OSA on CPAP, Peripheral neuropathy, Pneumonia (1990s X 1), Seasonal allergies, Stroke ([place]) (03/2017), and Type II diabetes mellitus ([place]). here with:  1.  Dementia 2.  Gait abnormality -Laboratory evaluation TSH, B12, RPR were normal -This is a mild abnormal EEG.  There is evidence of mild background slowing, indicating mild bihemispheric malfunction. There was no evidence of epileptiform discharge. -Referred to physical therapy   I spent 15 minutes with the patient. 50% of this time was spent   Butler Denmark, [place], DNP 06/16/2020, 5:51 AM [facility] [address], [place], [place] [address] [phone]

## 2020-07-03 ENCOUNTER — Encounter: Payer: Self-pay | Admitting: Neurology

## 2020-07-03 ENCOUNTER — Ambulatory Visit: Payer: Medicare HMO | Admitting: Neurology

## 2020-07-03 DIAGNOSIS — F0390 Unspecified dementia without behavioral disturbance: Secondary | ICD-10-CM | POA: Diagnosis not present

## 2020-07-03 MED ORDER — MEMANTINE HCL 10 MG PO TABS
10.0000 mg | ORAL_TABLET | Freq: Two times a day (BID) | ORAL | 11 refills | Status: DC
Start: 2020-07-03 — End: 2020-12-01

## 2020-07-03 NOTE — Progress Notes (Signed)
PATIENT: Laurie [name] DOB: [birth year]  REASON FOR VISIT: follow up HISTORY FROM: patient  HISTORY OF PRESENT ILLNESS: Today 07/03/20  HISTORY Laurie [name] is a 74 year old female, seen in request by her primary care physician Dr. Domenick Gong for evaluation of memory loss, she is accompanied by her daughter Joellen Jersey at today's visit on March 13, 2020.  I reviewed and summarized the referring note. She had a past medical history of CAD, status post pacemaker placement HTN HLD DM- insulin  She has been a homemaker, lives at home with her husband, she was admitted to hospital in May 2021 for few days history of worsening memory loss, confusion, with decreased p.o. intake, worsening low back pain, radiating to lower extremity, on January 08, 2020, her husband awoke to loud noises around 3 AM, found patient in the living room pinned against the wall, what appeared to have been that patient fell near the couch, she seems to have struck her head on the wall,  Patient was noted to be hypoxic into the 70s upon arrival, was treated with nonrebreather, was noted to have hypokalemia, elevated troponin, BMP, and mild elevated CPK, , treated with Lasix, chest x-ray showed marked large right-sided ossification, EKG showed ventricular paced tachycardia, she was diagnosed with acute hypoxic respiratory failure in the setting of sepsis, community-acquired pneumonia, she also had cardiac catheter, balloon angioplasty, echo showed decreased ejection fraction 30 to 35%,  Patient mental status was noted waxing and waning during her hospital stay, patient's family confirmed, over the past few years, she had intermittent confusion, slow decline in functional status, there was also concern of confusion about her insulin dose, poorly controlled diabetes, A1c was 9.8  Daughter confirmed today, patient was noted to have slow decline in functional status especially since January 2021, she repeat herself,  forget doctor's appointment, cannot keep up with her medications, especially insulin shots.  She was also noted to have gradual worsening gait abnormalities. Laboratory evaluations in May 2021, CMP showed elevated glucose 328, creatinine of 2.3, CMP showed normal hemoglobin of 16.8,  I personally reviewed MRI of the brain in April 2021, no acute abnormality, generalized atrophy, moderate supratentorium small vessel disease, progressed since 2018.   Update July 03, 2020 SS: Here today accompanied by her daughter, Joellen Jersey, memory, continues to decline, she feels doing well, Joellen Jersey reports repetitive questioning, repeats herself, confusion has improved.  She does her own ADLs, lives with her husband.  About a month ago, she had gone for a while without taking any of her medications, was very weak.  Diabetes poorly controlled, unsure recent A1c, last on file 9.8.  Having an aide come out 5 days a week to help with med/ADLs.  Has pacemaker. Says she cooks, but actually husband gets take out. EEG showed evidence of mild background slowing, no evidence of epileptiform discharge. Is pleasant, noted repetitive statements, " I do not remember that". No assistive device for ambulation. PT ordered last visit, not sure what happened with that.  REVIEW OF SYSTEMS: Out of a complete 14 system review of symptoms, the patient complains only of the following symptoms, and all other reviewed systems are negative.  Memory loss  ALLERGIES: Allergies  Allergen Reactions   Brilinta [Ticagrelor] Shortness Of Breath   Statins Other (See Comments)    Statins make the legs ache   Tricor [Fenofibrate] Other (See Comments)    Leg pain   Latex Itching and Other (See Comments)    Affected areas turn  red, also-   Tape Rash    Heart leads break out the skin    HOME MEDICATIONS: Outpatient Medications Prior to Visit  Medication Sig Dispense Refill   ACCU-CHEK FASTCLIX LANCETS MISC USE  TO CHECK BLOOD SUGAR FOUR  TIMES DAILY 408 each 2   ACCU-CHEK SMARTVIEW test strip USE  TO CHECK BLOOD SUGAR FOUR TIMES DAILY 400 each 1   Alcohol Swabs (B-D SINGLE USE SWABS REGULAR) PADS Use 8 daily for testing blood sugar and insulin injections. 800 each 1   BD VEO INSULIN SYRINGE U/F 31G X 15/64" 1 ML MISC USE 10 TIMES A DAY WITH INSULIN AS DIRECTED 300 each 0   beta carotene w/minerals (OCUVITE) tablet Take 1 tablet by mouth daily.     Blood Glucose Calibration (ACCU-CHEK SMARTVIEW CONTROL) LIQD Use to calibrate blood glucose meter. 3 each 1   cetirizine (ZYRTEC) 10 MG tablet Take 1 tablet (10 mg total) by mouth daily.     cholecalciferol (VITAMIN D) 1000 UNITS tablet Take 1,000 Units by mouth daily.      clopidogrel (PLAVIX) 75 MG tablet Take 1 tablet (75 mg total) by mouth daily. 90 tablet 1   Continuous Blood Gluc Sensor (FREESTYLE LIBRE 14 DAY SENSOR) MISC APPLY EVERY 14 DAYS 2 each 0   insulin glargine (LANTUS) 100 UNIT/ML injection Inject 0.2 mLs (20 Units total) into the skin at bedtime. 10 mL 11   Insulin Pen Needle (BD PEN NEEDLE NANO U/F) 32G X 4 MM MISC 1 each by Does not apply route 3 (three) times daily. Use to inject insulin TID; E11.40 100 each 2   ketoconazole (NIZORAL) 2 % cream Apply 1 application topically daily as needed for irritation. TO AFFECTED AREAS OF FACE      nitroGLYCERIN (NITROSTAT) 0.4 MG SL tablet Place 0.4 mg under the tongue every 5 (five) minutes as needed for chest pain.      potassium chloride SA (KLOR-CON M20) 20 MEQ tablet Take 1 tablet (20 mEq total) by mouth daily. 180 tablet 2   Probiotic Product (PROBIOTIC DAILY) CAPS Take 1 capsule by mouth daily.     psyllium (METAMUCIL) 58.6 % powder Take 1 packet by mouth daily as needed (fiber).      rosuvastatin (CRESTOR) 40 MG tablet Take 1 tablet (40 mg total) by mouth daily. 30 tablet 0   sacubitril-valsartan (ENTRESTO) 24-26 MG Take 1 tablet by mouth 2 (two) times daily. 60 tablet 0   vitamin C (ASCORBIC ACID) 500  MG tablet Take 500 mg by mouth every other day.     metoprolol succinate (TOPROL-XL) 25 MG 24 hr tablet Take 3 tablets (75 mg total) by mouth daily. Take with or immediately following a meal. 90 tablet 0   No facility-administered medications prior to visit.    PAST MEDICAL HISTORY: Past Medical History:  Diagnosis Date   AICD (automatic cardioverter/defibrillator) present    Bell's palsy 01/05/2010   CAD (coronary artery disease)    Cath  07/21/1999  mild ostial, 80% stenosis proximal LAD, 90% stenosis proximal Diag 1, 95% stenosis proximal OM 1, 80% stenosis proximal RCA, 95% stenosis mid RCA    CABG 07/22/99  LIMA to LAD, SVG to dx, SVG to OM, SVG to PDA  Dr. [name]   Cardiolite 2012 no ischemia EF 67%  Cath 10/23/12 Severe native three-vessel coronary artery disease with occlusion of all 3 native coronary arteries, Patent sap   Chronic lower back pain    Chronic systolic CHF (congestive  heart failure) ([place]) 10/22/2015   Cystitis    Gait abnormality    Hepatitis 1970s   "don't know which" (03/17/2017)   HIstory of Bell's palsy    October 2012    Hyperlipidemia    Intolerance to several statins    Hypertensive heart disease without CHF    Hypoxia 01/09/2020   Memory loss    Multinodular goiter    Obesity (BMI 30-39.9)    OSA on CPAP    "suppose to wear a mask" (03/17/2017)   Peripheral neuropathy    Pneumonia 1990s X 1   "think i had walking pneumonia"   Seasonal allergies    Stroke ([place]) 03/2017   "light one"; denies residual on 03/17/2017)   Type II diabetes mellitus ([place])     PAST SURGICAL HISTORY: Past Surgical History:  Procedure Laterality Date   BI-VENTRICULAR PACEMAKER UPGRADE  11/05/2015   "upgraded my pacemaker"   BIOPSY THYROID  05/2010   percutaneous   CARDIAC CATHETERIZATION N/A 10/21/2015   Procedure: Left Heart Cath and Cors/Grafts Angiography;  Surgeon: Belva Crome, MD;  Location: [facility];  Service: Cardiovascular;  Laterality:  N/A;   CARPAL TUNNEL RELEASE Bilateral    CATARACT EXTRACTION Right 01/06/2015   CATARACT EXTRACTION W/ INTRAOCULAR LENS IMPLANT Left 12/16/2014   CORONARY ARTERY BYPASS GRAFT  2000   CORONARY BALLOON ANGIOPLASTY N/A 01/09/2020   Procedure: CORONARY BALLOON ANGIOPLASTY;  Surgeon: Wellington Hampshire, MD;  Location: [facility];  Service: Cardiovascular;  Laterality: N/A;   CORONARY STENT INTERVENTION N/A 10/28/2017   Procedure: CORONARY STENT INTERVENTION;  Surgeon: Jettie Booze, MD;  Location: [facility];  Service: Cardiovascular;  Laterality: N/A;   CYSTECTOMY Right    "middle finger"   DENTAL IMPLANTS     EP IMPLANTABLE DEVICE N/A 11/05/2015   Procedure: BiV Upgrade;  Surgeon: Deboraha Sprang, MD;  Location: [facility];  Service: Cardiovascular;  Laterality: N/A;   INSERT / REPLACE / REMOVE PACEMAKER     LEAD REVISION N/A 10/25/2012   Procedure: LEAD REVISION;  Surgeon: Evans Lance, MD;  Location: [facility] CATH LAB;  Service: Cardiovascular;  Laterality: N/A;   LEFT HEART CATH AND CORS/GRAFTS ANGIOGRAPHY N/A 10/28/2017   Procedure: LEFT HEART CATH AND CORS/GRAFTS ANGIOGRAPHY;  Surgeon: Jettie Booze, MD;  Location: [facility];  Service: Cardiovascular;  Laterality: N/A;   LEFT HEART CATH AND CORS/GRAFTS ANGIOGRAPHY N/A 01/09/2020   Procedure: LEFT HEART CATH AND CORS/GRAFTS ANGIOGRAPHY;  Surgeon: Wellington Hampshire, MD;  Location: [facility];  Service: Cardiovascular;  Laterality: N/A;   LEFT HEART CATHETERIZATION WITH CORONARY ANGIOGRAM N/A 10/23/2012   Procedure: LEFT HEART CATHETERIZATION WITH CORONARY ANGIOGRAM;  Surgeon: Jacolyn Reedy, MD;  Location: [facility] CATH LAB;  Service: Cardiovascular;  Laterality: N/A;   PERMANENT PACEMAKER INSERTION N/A 10/23/2012   Procedure: PERMANENT PACEMAKER INSERTION;  Surgeon: Deboraha Sprang, MD;  Location: [facility] CATH LAB;  Service: Cardiovascular;  Laterality: N/A;   PLANTAR FASCIA RELEASE Left    "&  clipped a tendon that went thru bottom of my foot"   TEMPORARY PACEMAKER INSERTION N/A 10/22/2012   Procedure: TEMPORARY PACEMAKER INSERTION;  Surgeon: Troy Sine, MD;  Location: [facility] CATH LAB;  Service: Cardiovascular;  Laterality: N/A;   TUBAL LIGATION      FAMILY HISTORY: Family History  Problem Relation Age of Onset   Diabetes Father    Heart disease Father    Diabetes  Mother    Diabetes Other        Siblings and 2 children   Heart disease Other        CAD   Colon cancer Neg Hx    Colon polyps Neg Hx     SOCIAL HISTORY: Social History   Socioeconomic History   Marital status: Married    Spouse name: Not on file   Number of children: 2   Years of education: 12   Highest education level: High school graduate  Occupational History    Employer: RETIRED  Tobacco Use   Smoking status: Never Smoker   Smokeless tobacco: Never Used  Scientific laboratory technician Use: Never used  Substance and Sexual Activity   Alcohol use: No    Alcohol/week: 0.0 standard drinks   Drug use: No   Sexual activity: Not Currently  Other Topics Concern   Not on file  Social History Narrative   Lives at home with her husband.   Right-handed.   One cup caffeine per day.   Social Determinants of Health   Financial Resource Strain:    Difficulty of Paying Living Expenses: Not on file  Food Insecurity:    Worried About Charity fundraiser in the Last Year: Not on file   [employer] of Food in the Last Year: Not on file  Transportation Needs:    Lack of Transportation (Medical): Not on file   Lack of Transportation (Non-Medical): Not on file  Physical Activity:    Days of Exercise per Week: Not on file   Minutes of Exercise per Session: Not on file  Stress:    Feeling of Stress : Not on file  Social Connections:    Frequency of Communication with Friends and Family: Not on file   Frequency of Social Gatherings with Friends and Family: Not on file   Attends Religious  Services: Not on file   Active Member of Clubs or Organizations: Not on file   Attends Archivist Meetings: Not on file   Marital Status: Not on file  Intimate Partner Violence:    Fear of Current or Ex-Partner: Not on file   Emotionally Abused: Not on file   Physically Abused: Not on file   Sexually Abused: Not on file   PHYSICAL EXAM  Vitals:   07/03/20 1406  BP: 139/78  Pulse: 87  Weight: 172 lb 3.2 oz (78.1 kg)  Height: 5\' 2"  (1.575 m)   Body mass index is 31.5 kg/m.  Generalized: Well developed, in no acute distress  MMSE - Mini Mental State Exam 07/03/2020 03/13/2020  Orientation to time 2 4  Orientation to Place 4 5  Registration 3 3  Attention/ Calculation 4 2  Recall 1 0  Language- name 2 objects 2 2  Language- repeat 0 1  Language- follow 3 step command 1 3  Language- read & follow direction 1 1  Write a sentence 1 1  Copy design 0 1  Total score 19 23    Neurological examination  Mentation: Alert and cooperative, most history is provided by her daughter, speech is clear, follows exam commands.  She is pleasant, smiling. Cranial nerve II-XII: Pupils were equal round reactive to light. Extraocular movements were full, visual field were full on confrontational test. Facial sensation and strength were normal. Head turning and shoulder shrug  were normal and symmetric. Motor: Good strength all extremities Sensory: Sensory testing is intact to soft touch on all 4 extremities. No  evidence of extinction is noted.  Coordination: Cerebellar testing reveals good finger-nose-finger and heel-to-shin bilaterally.  Gait and station: Has to push off from seated position to stand, able to navigate stool to sit on exam table, gait is steady, slightly wide-based Reflexes: Deep tendon reflexes are symmetric and normal bilaterally.   DIAGNOSTIC DATA (LABS, IMAGING, TESTING) - I reviewed patient records, labs, notes, testing and imaging myself where  available.  Lab Results  Component Value Date   WBC 7.2 01/12/2020   HGB 13.5 01/12/2020   HCT 40.6 01/12/2020   MCV 92.3 01/12/2020   PLT 185 01/12/2020      Component Value Date/Time   NA 138 02/20/2020 1305   K 4.3 02/20/2020 1305   CL 98 02/20/2020 1305   CO2 23 02/20/2020 1305   GLUCOSE 400 (H) 02/20/2020 1305   GLUCOSE 232 (H) 01/12/2020 0252   BUN 21 02/20/2020 1305   CREATININE 0.88 02/20/2020 1305   CALCIUM 9.6 02/20/2020 1305   PROT 6.0 (L) 01/12/2020 0252   ALBUMIN 2.6 (L) 01/12/2020 0252   AST 19 01/12/2020 0252   ALT 20 01/12/2020 0252   ALKPHOS 46 01/12/2020 0252   BILITOT 0.5 01/12/2020 0252   GFRNONAA 65 02/20/2020 1305   GFRAA 75 02/20/2020 1305   Lab Results  Component Value Date   CHOL 168 01/11/2020   HDL 38 (L) 01/11/2020   LDLCALC 100 (H) 01/11/2020   LDLDIRECT 155.1 06/05/2012   TRIG 151 (H) 01/11/2020   CHOLHDL 4.4 01/11/2020   Lab Results  Component Value Date   HGBA1C 12.4 (H) 01/09/2020   Lab Results  Component Value Date   VITAMINB12 474 03/13/2020   Lab Results  Component Value Date   TSH 0.860 03/13/2020   ASSESSMENT AND PLAN 74 y.o. year old female  has a past medical history of AICD (automatic cardioverter/defibrillator) present, Bell's palsy (01/05/2010), CAD (coronary artery disease), Chronic lower back pain, Chronic systolic CHF (congestive heart failure) ([place]) (10/22/2015), Cystitis, Gait abnormality, Hepatitis (1970s), HIstory of Bell's palsy, Hyperlipidemia, Hypertensive heart disease without CHF, Hypoxia (01/09/2020), Memory loss, Multinodular goiter, Obesity (BMI 30-39.9), OSA on CPAP, Peripheral neuropathy, Pneumonia (1990s X 1), Seasonal allergies, Stroke ([place]) (03/2017), and Type II diabetes mellitus ([place]). here with:  1.  Dementia 2.  Gait abnormality -MMSE 19/30 today -EEG showed mild background slowing, no epileptiform discharge -TSH, B12, RPR were normal -Start Namenda working up to 10 mg twice a day -Held Aricept  due to history of AV Block, has pacemaker now -In general poor health, back on medications, working on DM control -Follow-up in 6 months or sooner if needed  I spent 30 minutes of face-to-face and non-face-to-face time with patient.  This included previsit chart review, lab review, study review, order entry, electronic health record documentation, patient education.  Butler Denmark, AGNP-C, DNP 07/03/2020, 2:15 PM [facility] [address], [place],  [address] [phone]

## 2020-07-03 NOTE — Patient Instructions (Addendum)
Start namenda 10 mg twice daily, start taking 1/2 tablet twice daily x 2 weeks, then take 1 tablet twice daily Try to get better control of diabetes  Continue seeing your primary doctor See you back in 6 months   Memantine Tablets What is this medicine? MEMANTINE (MEM an teen) is used to treat dementia caused by Alzheimer's disease. This medicine may be used for other purposes; ask your health care provider or pharmacist if you have questions. COMMON BRAND NAME(S): Namenda What should I tell my health care provider before I take this medicine? They need to know if you have any of these conditions:  difficulty passing urine  kidney disease  liver disease  seizures  an unusual or allergic reaction to memantine, other medicines, foods, dyes, or preservatives  pregnant or trying to get pregnant  breast-feeding How should I use this medicine? Take this medicine by mouth with a glass of water. Follow the directions on the prescription label. You may take this medicine with or without food. Take your doses at regular intervals. Do not take your medicine more often than directed. Continue to take your medicine even if you feel better. Do not stop taking except on the advice of your doctor or health care professional. Talk to your pediatrician regarding the use of this medicine in children. Special care may be needed. Overdosage: If you think you have taken too much of this medicine contact a poison control center or emergency room at once. NOTE: This medicine is only for you. Do not share this medicine with others. What if I miss a dose? If you miss a dose, take it as soon as you can. If it is almost time for your next dose, take only that dose. Do not take double or extra doses. If you do not take your medicine for several days, contact your health care provider. Your dose may need to be changed. What may interact with this  medicine?  acetazolamide  amantadine  cimetidine  dextromethorphan  dofetilide  hydrochlorothiazide  ketamine  metformin  methazolamide  quinidine  ranitidine  sodium bicarbonate  triamterene This list may not describe all possible interactions. Give your health care provider a list of all the medicines, herbs, non-prescription drugs, or dietary supplements you use. Also tell them if you smoke, drink alcohol, or use illegal drugs. Some items may interact with your medicine. What should I watch for while using this medicine? Visit your doctor or health care professional for regular checks on your progress. Check with your doctor or health care professional if there is no improvement in your symptoms or if they get worse. You may get drowsy or dizzy. Do not drive, use machinery, or do anything that needs mental alertness until you know how this drug affects you. Do not stand or sit up quickly, especially if you are an older patient. This reduces the risk of dizzy or fainting spells. Alcohol can make you more drowsy and dizzy. Avoid alcoholic drinks. What side effects may I notice from receiving this medicine? Side effects that you should report to your doctor or health care professional as soon as possible:  allergic reactions like skin rash, itching or hives, swelling of the face, lips, or tongue  agitation or a feeling of restlessness  depressed mood  dizziness  hallucinations  redness, blistering, peeling or loosening of the skin, including inside the mouth  seizures  vomiting Side effects that usually do not require medical attention (report to your doctor or health  care professional if they continue or are bothersome):  constipation  diarrhea  headache  nausea  trouble sleeping This list may not describe all possible side effects. Call your doctor for medical advice about side effects. You may report side effects to FDA at 1-800-FDA-1088. Where should I  keep my medicine? Keep out of the reach of children. Store at room temperature between 15 degrees and 30 degrees C (59 degrees and 86 degrees F). Throw away any unused medicine after the expiration date. NOTE: This sheet is a summary. It may not cover all possible information. If you have questions about this medicine, talk to your doctor, pharmacist, or health care provider.  2020 Elsevier/Gold Standard (2013-06-18 14:10:42)

## 2020-07-07 ENCOUNTER — Ambulatory Visit (INDEPENDENT_AMBULATORY_CARE_PROVIDER_SITE_OTHER): Payer: Medicare HMO

## 2020-07-07 DIAGNOSIS — I482 Chronic atrial fibrillation, unspecified: Secondary | ICD-10-CM | POA: Diagnosis not present

## 2020-07-07 DIAGNOSIS — Z794 Long term (current) use of insulin: Secondary | ICD-10-CM | POA: Diagnosis not present

## 2020-07-07 DIAGNOSIS — I130 Hypertensive heart and chronic kidney disease with heart failure and stage 1 through stage 4 chronic kidney disease, or unspecified chronic kidney disease: Secondary | ICD-10-CM | POA: Diagnosis not present

## 2020-07-07 DIAGNOSIS — Z95810 Presence of automatic (implantable) cardiac defibrillator: Secondary | ICD-10-CM | POA: Diagnosis not present

## 2020-07-07 DIAGNOSIS — I5022 Chronic systolic (congestive) heart failure: Secondary | ICD-10-CM | POA: Diagnosis not present

## 2020-07-07 DIAGNOSIS — E1140 Type 2 diabetes mellitus with diabetic neuropathy, unspecified: Secondary | ICD-10-CM | POA: Diagnosis not present

## 2020-07-07 DIAGNOSIS — F0390 Unspecified dementia without behavioral disturbance: Secondary | ICD-10-CM | POA: Diagnosis not present

## 2020-07-09 NOTE — Progress Notes (Signed)
EPIC Encounter for ICM Monitoring  Patient Name: Laurie [name] is a 74 y.o. female Date: 07/09/2020 Primary Care Physican: Laurie Pao, MD Primary Cardiologist:Laurie [name] Electrophysiologist:Laurie [name] Bi-V Pacing:99% 07/03/2020 Weight: 172 lbs     Spoke with daughter Laurie [name] per DPR.  BS today was 431 today at PCP office.  She reports she has been dx with dementia by neurologist. She typically eats restaurant foods.         Discussed restaurant foods are typically high in salt and to avoid if possible.  CorVueThoracic impedancesuggesting possible fluid accumulation starting 07/03/2020 and also from 9/28-10/12.  Prescribed:Not on diuretic  Potassium 20 mEq take 1 tablet daily.  Labs: 02/20/2020 Creatinine 0.88, BUN 24, Potassium 4.3, Sodium 138, GFR 65-75 01/30/2020 Creatinine 1.2, BUN 7, Potassium 3.8, Sodium 133, GFR 44-53.3 01/12/2020 Creatinine 0.92, BUN 21, Potassium 3.3, Sodium 136, GFR >60 01/11/2020 Creatinine 0.85, BUN 22, Potassium 3.7, Sodium 135, GFR >60 01/10/2020 Creatinine 0.84, BUN 24, Potassium 3.4, Sodium 137, GFR >60 01/09/2020 Creatinine1.02, BUN21, Potassium3.6, [id number], GFR55->60 01/08/2020 Potassium7.4, Sodium133  01/08/2020 Creatinine1.00, BUN17, Potassium5.5, Sodium138, GFR56->60 A complete set of results can be found in Results Review.  Recommendations:Advised to avoid restaurant foods if possible and to limit salt intake to 2000 mg daily.  Advised Laurie [name] will review fluid levels at [facility] on 07/16/2020 and evaluate it she needs to prescribe a diuretic.  Follow-up plan: ICM clinic phone appointment on11/05/2020 to recheck fluid levels (will be rechecked at 11/3 OV also). 91 day device clinic remote transmission 08/13/2020.  EP/Cardiology Office Visits:07/16/2020 with Laurie [name].   Copy of ICM check sent to [facility].    3 month ICM trend: 07/07/2020    1 Year ICM trend:       Laurie Billings,  RN 07/09/2020 8:08 AM

## 2020-07-15 NOTE — Progress Notes (Addendum)
Cardiology Office Note Date:  07/15/2020  Patient ID:  Laurie [name], Laurie [name] 01/24/1946, MRN [id number] PCP:  Haywood Pao, MD  Cardiologist:  Dr. Bettina Gavia EP: Dr. [name]     Chief Complaint:  6 months  History of Present Illness: Laurie [name] is a 74 y.o. female with history of CAD (CABG 2000 >> 12/2019 POBA to SVG OM), bells Palsy, HTN, HLD, CM, chronic CHF (systolic), AFib, stroke (left with chronic balance problems), OSA (uses CPAP), DM, CHB  She was hospitalized with 01/08/20 with sepsis/pneumonia, resp failure.  Trop were elevated suspect demand though did undergo LHC w/PCI w/POBA.  LVEF 30-35% ACE transitioned to [facility].  Mentions waxing/waning AMS (reported by family occasionally at home as well).  Noted her DM poorly controlled.  Poor family and particualry patient knowledge on meds and regime.  Planned fo close PMD and cardiology follow up.  Discharged 01/12/20  She comes in today to be seen for Dr. [name].  He saw her 01/22/20, she was post hospital stay, had PCI to an SVG and diuresed.  He noted labs with elevated Creat, AKI.  She was apparently instructed by the PMD office previously to hold lasix but had not.  Dr. [name] advised her to hold the lasix, Entresto, and aldactone until after f/u labs were done (scheduled for that week it seems)  He also apparently reached out to the PMD to f/u on CXR for pneumonia..  There is mention of BB intolerance, with baseline sinus rates about 100(this may be historical)  CorVue monitoring with L. Short, RN noted worsening volume status, pending labs from [place] last corvue looked better   I saw her June 2021 She is accompanied today by her daughter who is trying to get her parents' medical care and medicines caught up and straightened out. As far as we can tell, the patient may still be taking the Spironolactone, she has a hand written note that they think was via the PMD office  "keep taking entresto and spironolactone, take  furosemide 40mg  one tablet daily" The patient however feels well.  Her daughter mentions this is the best she has been in quite a while, better energy, better stamina. The patient denies any CP, palpitations or cardiac awareness.  No rest SOB, some DOE with more exertional activities though not with casual paced walking.  She also thinks her balance and gait is better, not as reliant on her cane. No falls No dizzy spells, near syncope or syncope.  She reports CPAP compliance "almost always"   LABS 02/01/20 from her PMD BUN/Creat 7/1.2 K+ 3.8  No changes were made, they were going to call with accurate list of medicines once home.   TODAY She comes accompanied by her daughter. She is doing well Denies any CP or palpitations, no cardiac awareness of any kind. No dizzy spells, near syncope or syncope. She denies rest SOB, minimal DOE  Her leg wound healed up well  Her med list is reported as accurate at this time.  Her daughter is not certain when or why the metoprolol was stopped.  She thinks this may have just run out and o longer an active pill bottle on her basket at home.    Device information SJM CRT-D 11/05/2015 (RV/LV leads 2017), RA lead 2014 Has both MDT and SJM leads He has an abandoned RV pacing lead CAN NOT have MRIs  Past Medical History:  Diagnosis Date  . AICD (automatic cardioverter/defibrillator) present   . Bell's  palsy 01/05/2010  . CAD (coronary artery disease)    Cath  07/21/1999  mild ostial, 80% stenosis proximal LAD, 90% stenosis proximal Diag 1, 95% stenosis proximal OM 1, 80% stenosis proximal RCA, 95% stenosis mid RCA    CABG 07/22/99  LIMA to LAD, SVG to dx, SVG to OM, SVG to PDA  Dr. [name]   Cardiolite 2012 no ischemia EF 67%  Cath 10/23/12 Severe native three-vessel coronary artery disease with occlusion of all 3 native coronary arteries, Patent sap  . Chronic lower back pain   . Chronic systolic CHF (congestive heart failure) ([place]) 10/22/2015  . Cystitis    . Gait abnormality   . Hepatitis 1970s   "don't know which" (03/17/2017)  . HIstory of Bell's palsy    October 2012   . Hyperlipidemia    Intolerance to several statins   . Hypertensive heart disease without CHF   . Hypoxia 01/09/2020  . Memory loss   . Multinodular goiter   . Obesity (BMI 30-39.9)   . OSA on CPAP    "suppose to wear a mask" (03/17/2017)  . Peripheral neuropathy   . Pneumonia 1990s X 1   "think i had walking pneumonia"  . Seasonal allergies   . Stroke ([place]) 03/2017   "light one"; denies residual on 03/17/2017)  . Type II diabetes mellitus ([place])     Past Surgical History:  Procedure Laterality Date  . BI-VENTRICULAR PACEMAKER UPGRADE  11/05/2015   "upgraded my pacemaker"  . BIOPSY THYROID  05/2010   percutaneous  . CARDIAC CATHETERIZATION N/A 10/21/2015   Procedure: Left Heart Cath and Cors/Grafts Angiography;  Surgeon: Belva Crome, MD;  Location: [place] [facility];  Service: Cardiovascular;  Laterality: N/A;  . CARPAL TUNNEL RELEASE Bilateral   . CATARACT EXTRACTION Right 01/06/2015  . CATARACT EXTRACTION W/ INTRAOCULAR LENS IMPLANT Left 12/16/2014  . CORONARY ARTERY BYPASS GRAFT  2000  . CORONARY BALLOON ANGIOPLASTY N/A 01/09/2020   Procedure: CORONARY BALLOON ANGIOPLASTY;  Surgeon: Wellington Hampshire, MD;  Location: [place] [facility];  Service: Cardiovascular;  Laterality: N/A;  . CORONARY STENT INTERVENTION N/A 10/28/2017   Procedure: CORONARY STENT INTERVENTION;  Surgeon: Jettie Booze, MD;  Location: [place] [facility];  Service: Cardiovascular;  Laterality: N/A;  . CYSTECTOMY Right    "middle finger"  . DENTAL IMPLANTS    . EP IMPLANTABLE DEVICE N/A 11/05/2015   Procedure: BiV Upgrade;  Surgeon: Deboraha Sprang, MD;  Location: [place] [facility];  Service: Cardiovascular;  Laterality: N/A;  . INSERT / REPLACE / REMOVE PACEMAKER    . LEAD REVISION N/A 10/25/2012   Procedure: LEAD REVISION;  Surgeon: Evans Lance, MD;  Location: [facility] CATH LAB;  Service:  Cardiovascular;  Laterality: N/A;  . LEFT HEART CATH AND CORS/GRAFTS ANGIOGRAPHY N/A 10/28/2017   Procedure: LEFT HEART CATH AND CORS/GRAFTS ANGIOGRAPHY;  Surgeon: Jettie Booze, MD;  Location: [place] [facility];  Service: Cardiovascular;  Laterality: N/A;  . LEFT HEART CATH AND CORS/GRAFTS ANGIOGRAPHY N/A 01/09/2020   Procedure: LEFT HEART CATH AND CORS/GRAFTS ANGIOGRAPHY;  Surgeon: Wellington Hampshire, MD;  Location: [place] [facility];  Service: Cardiovascular;  Laterality: N/A;  . LEFT HEART CATHETERIZATION WITH CORONARY ANGIOGRAM N/A 10/23/2012   Procedure: LEFT HEART CATHETERIZATION WITH CORONARY ANGIOGRAM;  Surgeon: Jacolyn Reedy, MD;  [facility] C[facility] LAB;  Service: Cardiovascular;  Laterality: N/A;  . PERMANENT PACEMAKER INSERTION N/A 10/23/2012   Procedure: PERMANENT PACEMAKER INSERTION;  Surgeon: Remo Lipps  Peterson Lombard, MD;  Location: [facility] CATH LAB;  Service: Cardiovascular;  Laterality: N/A;  . PLANTAR FASCIA RELEASE Left    "& clipped a tendon that went thru bottom of my foot"  . TEMPORARY PACEMAKER INSERTION N/A 10/22/2012   Procedure: TEMPORARY PACEMAKER INSERTION;  Surgeon: Troy Sine, MD;  Location: [facility] CATH LAB;  Service: Cardiovascular;  Laterality: N/A;  . TUBAL LIGATION      Current Outpatient Medications  Medication Sig Dispense Refill  . ACCU-CHEK FASTCLIX LANCETS MISC USE  TO CHECK BLOOD SUGAR FOUR TIMES DAILY 408 each 2  . ACCU-CHEK SMARTVIEW test strip USE  TO CHECK BLOOD SUGAR FOUR TIMES DAILY 400 each 1  . Alcohol Swabs (B-D SINGLE USE SWABS REGULAR) PADS Use 8 daily for testing blood sugar and insulin injections. 800 each 1  . BD VEO INSULIN SYRINGE U/F 31G X 15/64" 1 ML MISC USE 10 TIMES A DAY WITH INSULIN AS DIRECTED 300 each 0  . beta carotene w/minerals (OCUVITE) tablet Take 1 tablet by mouth daily.    . Blood Glucose Calibration (ACCU-CHEK SMARTVIEW CONTROL) LIQD Use to calibrate blood glucose meter. 3 each 1  . cetirizine (ZYRTEC) 10 MG tablet Take 1 tablet (10  mg total) by mouth daily.    . cholecalciferol (VITAMIN D) 1000 UNITS tablet Take 1,000 Units by mouth daily.     . clopidogrel (PLAVIX) 75 MG tablet Take 1 tablet (75 mg total) by mouth daily. 90 tablet 1  . Continuous Blood Gluc Sensor (FREESTYLE LIBRE 14 DAY SENSOR) MISC APPLY EVERY 14 DAYS 2 each 0  . insulin glargine (LANTUS) 100 UNIT/ML injection Inject 0.2 mLs (20 Units total) into the skin at bedtime. 10 mL 11  . Insulin Pen Needle (BD PEN NEEDLE NANO U/F) 32G X 4 MM MISC 1 each by Does not apply route 3 (three) times daily. Use to inject insulin TID; E11.40 100 each 2  . ketoconazole (NIZORAL) 2 % cream Apply 1 application topically daily as needed for irritation. TO AFFECTED AREAS OF FACE     . memantine (NAMENDA) 10 MG tablet Take 1 tablet (10 mg total) by mouth 2 (two) times daily. 60 tablet 11  . metoprolol succinate (TOPROL-XL) 25 MG 24 hr tablet Take 3 tablets (75 mg total) by mouth daily. Take with or immediately following a meal. 90 tablet 0  . nitroGLYCERIN (NITROSTAT) 0.4 MG SL tablet Place 0.4 mg under the tongue every 5 (five) minutes as needed for chest pain.     . potassium chloride SA (KLOR-CON M20) 20 MEQ tablet Take 1 tablet (20 mEq total) by mouth daily. 180 tablet 2  . Probiotic Product (PROBIOTIC DAILY) CAPS Take 1 capsule by mouth daily.    . psyllium (METAMUCIL) 58.6 % powder Take 1 packet by mouth daily as needed (fiber).     . rosuvastatin (CRESTOR) 40 MG tablet Take 1 tablet (40 mg total) by mouth daily. 30 tablet 0  . sacubitril-valsartan (ENTRESTO) 24-26 MG Take 1 tablet by mouth 2 (two) times daily. 60 tablet 0  . vitamin C (ASCORBIC ACID) 500 MG tablet Take 500 mg by mouth every other day.     No current facility-administered medications for this visit.    Allergies:   Brilinta [ticagrelor], Statins, Tricor [fenofibrate], Latex, and Tape   Social History:  The patient  reports that she has never smoked. She has never used smokeless tobacco. She reports that  she does not drink alcohol and does not use drugs.  Family History:  The patient's family history includes Diabetes in her father, mother, and another family member; Heart disease in her father and another family member.  ROS:  Please see the history of present illness.   All other systems are reviewed and otherwise negative.   PHYSICAL EXAM:  VS:  There were no vitals taken for this visit. BMI: There is no height or weight on file to calculate BMI. Well nourished, well developed, in no acute distress  HEENT: normocephalic, atraumatic  Neck: no JVD, carotid bruits or masses Cardiac:  RRR; no significant murmurs, no rubs, or gallops Lungs:  CTA b/l, no wheezing, rhonchi or rales  Abd: soft, nontender MS: no deformity, advance atrophy Ext: no edema. Skin: warm and dry, no rash Neuro:  No gross deficits appreciated Psych: euthymic mood, full affect  PPM site is stable, no tethering or discomfort   EKG:  Not done today  PPM interrogation done today and reviewed by myself: Battery and lead measurements are good No arrhythmias    01/09/2020: LHC/PCI  Mid RCA lesion is 100% stenosed.  Ost RCA to Prox RCA lesion is 50% stenosed.  Previously placed Prox Graft stent (unknown type) is widely patent.  Mid Graft lesion is 65% stenosed.  Origin lesion is 100% stenosed.  LIMA and is normal in caliber.  The graft exhibits no disease.  Mid LAD to Dist LAD lesion is 80% stenosed.  Origin to Prox Graft lesion is 95% stenosed.  Post intervention, there is a 10% residual stenosis.  Balloon angioplasty was performed using a BALLOON [place] EMERGE MR 3.0X20.  Dist LM lesion is 80% stenosed.  Ost Cx to Prox Cx lesion is 100% stenosed.  Ost LAD to Prox LAD lesion is 100% stenosed.   1.  Severe underlying three-vessel coronary artery disease with occluded native vessels. 2.  Known chronically occluded SVG to right PDA.  LIMA to LAD is patent.  However, there is chronic diffuse disease  in the mid and distal LAD.  SVG to diagonal is patent with no significant in-stent restenosis.  However, there is a borderline 60 to 70% lesion after the stent.  SVG to OM is patent with severe 95% in-stent restenosis proximally which is the likely culprit for myocardial infarction. 3.  Left ventricular angiography was not performed.  EF was 30 to 35% by echo.  Mildly elevated left ventricular end-diastolic pressure at 16 mmHg. 4.  Successful balloon angioplasty of SVG to OM for severe in-stent restenosis.  Recommendations: This was a very difficult and prolonged procedure from the left radial artery given inability to engage the SVG to OM.  There is significant tortuosity in the left subclavian artery.  I had to switch to a femoral approach. Avoid left radial catheterization in the future. Continue dual antiplatelet therapy indefinitely if possible. Aggressive treatment of risk factors. The lesion in the SVG to diagonal can likely be monitored for now.  PCI can be considered if she has residual angina.    01/09/2020: TTE IMPRESSIONS  1. Left ventricular ejection fraction, by estimation, is 30 to 35%. The  left ventricle has moderately decreased function. The left ventricle has  no regional wall motion abnormalities. The left ventricular internal  cavity size was mildly dilated. Left  ventricular diastolic parameters are consistent with Grade II diastolic  dysfunction (pseudonormalization). Elevated left ventricular end-diastolic  pressure. There is akinesis of the left ventricular, entire inferior wall,  inferolateral wall and apical  segment.  2. Right ventricular systolic function is  normal. The right ventricular  size is normal. There is normal pulmonary artery systolic pressure.  3. Left atrial size was mildly dilated.  4. The mitral valve is normal in structure. Mild mitral valve  regurgitation. No evidence of mitral stenosis.  5. The aortic valve is tricuspid. Aortic valve  regurgitation is not  visualized. Mild aortic valve sclerosis is present, with no evidence of  aortic valve stenosis.  6. The inferior vena cava is normal in size with greater than 50%  respiratory variability, suggesting right atrial pressure of 3 mmHg.      Recent Labs: 01/08/2020: B Natriuretic Peptide 655.6 01/12/2020: ALT 20; Hemoglobin 13.5; Platelets 185 02/20/2020: BUN 21; Creatinine, Ser 0.88; Potassium 4.3; Sodium 138 03/13/2020: TSH 0.860  01/11/2020: Cholesterol 168; HDL 38; LDL Cholesterol 100; Total CHOL/HDL Ratio 4.4; Triglycerides 151; VLDL 30   CrCl cannot be calculated (Patient's most recent lab result is older than the maximum 21 days allowed.).   Wt Readings from Last 3 Encounters:  07/03/20 172 lb 3.2 oz (78.1 kg)  03/13/20 183 lb (83 kg)  02/20/20 193 lb (87.5 kg)     Other studies reviewed: Additional studies/records reviewed today include: summarized above  ASSESSMENT AND PLAN:  1. ICD     CRT-D     Intact function, no programming changes made  2. Paroxysmal Afib     CHA2DS2Vasc is 8, not on a/c 2/2 low burden (in review of Dr. Joya Gaskins last available note)     <1% AMS burden     None noted since last visit  3. CAD     Most recent intervention, 12/2019 POBA to SVG OM     No symptoms     On ASA, plavix, statin     Asked about getting back on track with Dr. Bettina Gavia.  She states she only saw him once when Dr. Wynonia Lawman first stopped working, but that has been a long time and his office too far away for her, she prefers to get someone here     Will get have her see Dr. Johney Frame  4. CM (presumed NICM)     Suspect 2/2 RV pacing, PPM >> CRT-D     corVue wobbles, so does her weight     Exam does not suggest volume OL currently     On entresto     Unclear why her metoprolol was stopped, seem inadvertant     Resume Toprol 50mg  daily     BMET     She is followed by L. Short, RN as well   5.  HTN      Looks good, should tolerate Toprol   Disposition: We  will continue remotes and see her back in a year, she will get established with dr. Johney Frame at her next available for her CAD/CM/CHF   Current medicines are reviewed at length with the patient today.  The patient did not have any concerns regarding medicines.    Venetia Night, PA-C 07/15/2020 9:03 PM     [address] [place] [id number] [phone] (office)  [phone] (fax)

## 2020-07-16 ENCOUNTER — Ambulatory Visit (INDEPENDENT_AMBULATORY_CARE_PROVIDER_SITE_OTHER): Payer: Medicare HMO | Admitting: Physician Assistant

## 2020-07-16 ENCOUNTER — Encounter: Payer: Self-pay | Admitting: Physician Assistant

## 2020-07-16 ENCOUNTER — Other Ambulatory Visit: Payer: Self-pay

## 2020-07-16 VITALS — BP 140/78 | HR 93 | Ht 62.0 in | Wt 177.0 lb

## 2020-07-16 DIAGNOSIS — I2510 Atherosclerotic heart disease of native coronary artery without angina pectoris: Secondary | ICD-10-CM

## 2020-07-16 DIAGNOSIS — Z95810 Presence of automatic (implantable) cardiac defibrillator: Secondary | ICD-10-CM

## 2020-07-16 DIAGNOSIS — I5022 Chronic systolic (congestive) heart failure: Secondary | ICD-10-CM | POA: Diagnosis not present

## 2020-07-16 DIAGNOSIS — I10 Essential (primary) hypertension: Secondary | ICD-10-CM | POA: Diagnosis not present

## 2020-07-16 DIAGNOSIS — Z79899 Other long term (current) drug therapy: Secondary | ICD-10-CM

## 2020-07-16 MED ORDER — METOPROLOL SUCCINATE ER 50 MG PO TB24
50.0000 mg | ORAL_TABLET | Freq: Every day | ORAL | 3 refills | Status: DC
Start: 2020-07-16 — End: 2022-05-10

## 2020-07-16 NOTE — Patient Instructions (Addendum)
Medication Instructions:  METOPROLOL SUCCINATE (TOPROL XL )  50 MG ONCE  A DAY   *If you need a refill on your cardiac medications before your next appointment, please call your pharmacy*   Lab Work:  BMET TODAY   If you have labs (blood work) drawn today and your tests are completely normal, you will receive your results only by: [name] MyChart Message (if you have MyChart) OR . A paper copy in the mail If you have any lab test that is abnormal or we need to change your treatment, we will call you to review the results.   Testing/Procedures: NONE ORDERED  TODAY   Follow-Up: At [facility], you and your health needs are our priority.  As part of our continuing mission to provide you with exceptional heart care, we have created designated Provider Care Teams.  These Care Teams include your primary Cardiologist (physician) and Advanced Practice Providers (APPs -  Physician Assistants and Nurse Practitioners) who all work together to provide you with the care you need, when you need it.  We recommend signing up for the patient portal called "MyChart".  Sign up information is provided on this After Visit Summary.  MyChart is used to connect with patients for Virtual Visits (Telemedicine).  Patients are able to view lab/test results, encounter notes, upcoming appointments, etc.  Non-urgent messages can be sent to your provider as well.   To learn more about what you can do with MyChart, go to [email or web address].    Your next appointment:    Dr Johney Frame First available to establish as new patient   1 year(s)  The format for your next appointment:   In Person  Provider:   You may see Dr.Klein  or one of the following Advanced Practice Providers on your designated Care Team:    Chanetta Marshall, NP  Tommye Standard, PA-C  Legrand Como "Oda Kilts, [place]    Other Instructions

## 2020-07-17 LAB — BASIC METABOLIC PANEL
BUN/Creatinine Ratio: 15 (ref 12–28)
BUN: 14 mg/dL (ref 8–27)
CO2: 25 mmol/L (ref 20–29)
Calcium: 8.9 mg/dL (ref 8.7–10.3)
Chloride: 102 mmol/L (ref 96–106)
Creatinine, Ser: 0.94 mg/dL (ref 0.57–1.00)
GFR calc Af Amer: 70 mL/min/{1.73_m2} (ref >59)
GFR calc non Af Amer: 60 mL/min/{1.73_m2} (ref >59)
Glucose: 342 mg/dL — ABNORMAL HIGH (ref 65–99)
Potassium: 4.1 mmol/L (ref 3.5–5.2)
Sodium: 141 mmol/L (ref 134–144)

## 2020-07-25 NOTE — Progress Notes (Signed)
No ICM remote transmission received for 07/22/2020 and next ICM transmission scheduled for 08/04/2020.

## 2020-08-03 NOTE — Progress Notes (Signed)
Cardiology Office Note:    Date:  08/05/2020   ID:  Laurie [name], DOB 05/05/1946, MRN [id number]  PCP:  Haywood Pao, MD  [facility] Cardiologist:  Ezzard Standing, MD  [facility] Electrophysiologist:  None   Referring MD: Haywood Pao, MD    History of Present Illness:    Laurie [name] is a 74 y.o. female with a history of CAD (CABG 2000 >> 12/2019 POBA to SVG OM; PCI to SVG), Bells Palsy, HTN, HLD, chronic systolic heart failure, AFib, CVA (left with chronic balance problems), OSA (uses CPAP), DM, CHB s/p SJM CRT-D 11/05/2015 (RV/LV leads 2017), RA lead 2014 who was referred by Dr. Osborne Casco for further management of her CAD and systolic heart failure.  Last seen 07/16/20 by Tommye Standard, PA for follow-up. She was doing well from a CV perspective. Device interrogation showed normal function. Given her multiple cardiac comorbidities.  Patient states that she feels overall well from a CV standpoint. No LE edema, no recent weight gain. No chest pain, orthopnea, PND, palpitations, lightheadedness or dizziness. Has some balance issues with falls but walks with a cane that helps. Patient has remained active and no symptoms with exertion. She has been taking her medications as prescribed. Her main concern today is her glucoses have been elevated at home and her insulin has been adjusted.   CorVue today with normal impedences suggesting euvolemia today.   Past Medical History:  Diagnosis Date  . AICD (automatic cardioverter/defibrillator) present   . Bell's palsy 01/05/2010  . CAD (coronary artery disease)    Cath  07/21/1999  mild ostial, 80% stenosis proximal LAD, 90% stenosis proximal Diag 1, 95% stenosis proximal OM 1, 80% stenosis proximal RCA, 95% stenosis mid RCA    CABG 07/22/99  LIMA to LAD, SVG to dx, SVG to OM, SVG to PDA  Dr. [name]   Cardiolite 2012 no ischemia EF 67%  Cath 10/23/12 Severe native three-vessel coronary artery disease with occlusion of all 3 native  coronary arteries, Patent sap  . Chronic lower back pain   . Chronic systolic CHF (congestive heart failure) ([place]) 10/22/2015  . Cystitis   . Gait abnormality   . Hepatitis 1970s   "don't know which" (03/17/2017)  . HIstory of Bell's palsy    October 2012   . Hyperlipidemia    Intolerance to several statins   . Hypertensive heart disease without CHF   . Hypoxia 01/09/2020  . Memory loss   . Multinodular goiter   . Obesity (BMI 30-39.9)   . OSA on CPAP    "suppose to wear a mask" (03/17/2017)  . Peripheral neuropathy   . Pneumonia 1990s X 1   "think i had walking pneumonia"  . Seasonal allergies   . Stroke ([place]) 03/2017   "light one"; denies residual on 03/17/2017)  . Type II diabetes mellitus ([place])     Past Surgical History:  Procedure Laterality Date  . BI-VENTRICULAR PACEMAKER UPGRADE  11/05/2015   "upgraded my pacemaker"  . BIOPSY THYROID  05/2010   percutaneous  . CARDIAC CATHETERIZATION N/A 10/21/2015   Procedure: Left Heart Cath and Cors/Grafts Angiography;  Surgeon: Belva Crome, MD;  Location: [place] [facility];  Service: Cardiovascular;  Laterality: N/A;  . CARPAL TUNNEL RELEASE Bilateral   . CATARACT EXTRACTION Right 01/06/2015  . CATARACT EXTRACTION W/ INTRAOCULAR LENS IMPLANT Left 12/16/2014  . CORONARY ARTERY BYPASS GRAFT  2000  . CORONARY BALLOON ANGIOPLASTY N/A 01/09/2020   Procedure:  CORONARY BALLOON ANGIOPLASTY;  Surgeon: Wellington Hampshire, MD;  Location: [place] [facility];  Service: Cardiovascular;  Laterality: N/A;  . CORONARY STENT INTERVENTION N/A 10/28/2017   Procedure: CORONARY STENT INTERVENTION;  Surgeon: Jettie Booze, MD;  Location: [place] [facility];  Service: Cardiovascular;  Laterality: N/A;  . CYSTECTOMY Right    "middle finger"  . DENTAL IMPLANTS    . EP IMPLANTABLE DEVICE N/A 11/05/2015   Procedure: BiV Upgrade;  Surgeon: Deboraha Sprang, MD;  Location: [place] [facility];  Service: Cardiovascular;  Laterality: N/A;  . INSERT / REPLACE /  REMOVE PACEMAKER    . LEAD REVISION N/A 10/25/2012   Procedure: LEAD REVISION;  Surgeon: Evans Lance, MD;  Location: [facility] C[facility] LAB;  Service: Cardiovascular;  Laterality: N/A;  . LEFT HEART CATH AND CORS/GRAFTS ANGIOGRAPHY N/A 10/28/2017   Procedure: LEFT HEART CATH AND CORS/GRAFTS ANGIOGRAPHY;  Surgeon: Jettie Booze, MD;  Location: [place] [facility];  Service: Cardiovascular;  Laterality: N/A;  . LEFT HEART CATH AND CORS/GRAFTS ANGIOGRAPHY N/A 01/09/2020   Procedure: LEFT HEART CATH AND CORS/GRAFTS ANGIOGRAPHY;  Surgeon: Wellington Hampshire, MD;  Location: [place] [facility];  Service: Cardiovascular;  Laterality: N/A;  . LEFT HEART CATHETERIZATION WITH CORONARY ANGIOGRAM N/A 10/23/2012   Procedure: LEFT HEART CATHETERIZATION WITH CORONARY ANGIOGRAM;  Surgeon: Jacolyn Reedy, MD;  Location: [facility] CATH LAB;  Service: Cardiovascular;  Laterality: N/A;  . PERMANENT PACEMAKER INSERTION N/A 10/23/2012   Procedure: PERMANENT PACEMAKER INSERTION;  Surgeon: Deboraha Sprang, MD;  Location: [facility] CATH LAB;  Service: Cardiovascular;  Laterality: N/A;  . PLANTAR FASCIA RELEASE Left    "& clipped a tendon that went thru bottom of my foot"  . TEMPORARY PACEMAKER INSERTION N/A 10/22/2012   Procedure: TEMPORARY PACEMAKER INSERTION;  Surgeon: Troy Sine, MD;  Location: [facility] CATH LAB;  Service: Cardiovascular;  Laterality: N/A;  . TUBAL LIGATION      Current Medications: Current Meds  Medication Sig  . ACCU-CHEK FASTCLIX LANCETS MISC USE  TO CHECK BLOOD SUGAR FOUR TIMES DAILY  . ACCU-CHEK SMARTVIEW test strip USE  TO CHECK BLOOD SUGAR FOUR TIMES DAILY  . Alcohol Swabs (B-D SINGLE USE SWABS REGULAR) PADS Use 8 daily for testing blood sugar and insulin injections.  [name] aspirin EC 81 MG tablet Take 81 mg by mouth daily. Swallow whole.  . BD VEO INSULIN SYRINGE U/F 31G X 15/64" 1 ML MISC USE 10 TIMES A DAY WITH INSULIN AS DIRECTED  . beta carotene w/minerals (OCUVITE) tablet Take 1 tablet by mouth daily.  . Blood  Glucose Calibration (ACCU-CHEK SMARTVIEW CONTROL) LIQD Use to calibrate blood glucose meter.  . cetirizine (ZYRTEC) 10 MG tablet Take 1 tablet (10 mg total) by mouth daily.  . cholecalciferol (VITAMIN D) 1000 UNITS tablet Take 1,000 Units by mouth daily.   . clopidogrel (PLAVIX) 75 MG tablet Take 1 tablet (75 mg total) by mouth daily.  . Continuous Blood Gluc Sensor (FREESTYLE LIBRE 14 DAY SENSOR) MISC APPLY EVERY 14 DAYS  . insulin glargine (LANTUS) 100 UNIT/ML injection Inject 30 Units into the skin daily.  . Insulin Pen Needle (BD PEN NEEDLE NANO U/F) 32G X 4 MM MISC 1 each by Does not apply route 3 (three) times daily. Use to inject insulin TID; E11.40  . ketoconazole (NIZORAL) 2 % cream Apply 1 application topically daily as needed for irritation. TO AFFECTED AREAS OF FACE   . memantine (NAMENDA) 10 MG tablet Take 1 tablet (  10 mg total) by mouth 2 (two) times daily.  . metoprolol succinate (TOPROL-XL) 50 MG 24 hr tablet Take 1 tablet (50 mg total) by mouth daily. Take with or immediately following a meal.  . nitroGLYCERIN (NITROSTAT) 0.4 MG SL tablet Place 0.4 mg under the tongue every 5 (five) minutes as needed for chest pain.   [name] psyllium (METAMUCIL) 58.6 % powder Take 1 packet by mouth daily as needed (fiber).   . rosuvastatin (CRESTOR) 40 MG tablet Take 40 mg by mouth daily.  . sacubitril-valsartan (ENTRESTO) 24-26 MG Take 1 tablet by mouth 2 (two) times daily.  . vitamin C (ASCORBIC ACID) 500 MG tablet Take 500 mg by mouth every other day.     Allergies:   Brilinta [ticagrelor], Statins, Tricor [fenofibrate], Latex, and Tape   Social History   Socioeconomic History  . Marital status: Married    Spouse name: Not on file  . Number of children: 2  . Years of education: 28  . Highest education level: High school graduate  Occupational History    Employer: RETIRED  Tobacco Use  . Smoking status: Never Smoker  . Smokeless tobacco: Never Used  Vaping Use  . Vaping Use: Never used   Substance and Sexual Activity  . Alcohol use: No    Alcohol/week: 0.0 standard drinks  . Drug use: No  . Sexual activity: Not Currently  Other Topics Concern  . Not on file  Social History Narrative   Lives at home with her husband.   Right-handed.   One cup caffeine per day.   Social Determinants of Health   Financial Resource Strain:   . Difficulty of Paying Living Expenses: Not on file  Food Insecurity:   . Worried About Charity fundraiser in the Last Year: Not on file  . Ran Out of Food in the Last Year: Not on file  Transportation Needs:   . Lack of Transportation (Medical): Not on file  . Lack of Transportation (Non-Medical): Not on file  Physical Activity:   . Days of Exercise per Week: Not on file  . Minutes of Exercise per Session: Not on file  Stress:   . Feeling of Stress : Not on file  Social Connections:   . Frequency of Communication with Friends and Family: Not on file  . Frequency of Social Gatherings with Friends and Family: Not on file  . Attends Religious Services: Not on file  . Active Member of Clubs or Organizations: Not on file  . Attends Archivist Meetings: Not on file  . Marital Status: Not on file     Family History: The patient's family history includes Diabetes in her father, mother, and another family member; Heart disease in her father and another family member. There is no history of Colon cancer or Colon polyps.  ROS:   Please see the history of present illness.     Review of Systems  Constitutional: Negative for chills, fever and malaise/fatigue.  HENT: Negative for hearing loss.   Eyes: Negative for blurred vision.  Respiratory: Negative for shortness of breath.   Cardiovascular: Negative for chest pain, palpitations, orthopnea, claudication, leg swelling and PND.  Gastrointestinal: Negative for abdominal pain, nausea and vomiting.  Genitourinary: Negative for frequency.  Musculoskeletal: Positive for falls.   Neurological: Negative for dizziness and loss of consciousness.  Psychiatric/Behavioral: Negative for substance abuse.    EKGs/Labs/Other Studies Reviewed:    The following studies were reviewed today: LHC 2020/01/26:  Mid RCA  lesion is 100% stenosed.  Ost RCA to Prox RCA lesion is 50% stenosed.  Previously placed Prox Graft stent (unknown type) is widely patent.  Mid Graft lesion is 65% stenosed.  Origin lesion is 100% stenosed.  LIMA and is normal in caliber.  The graft exhibits no disease.  Mid LAD to Dist LAD lesion is 80% stenosed.  Origin to Prox Graft lesion is 95% stenosed.  Post intervention, there is a 10% residual stenosis.  Balloon angioplasty was performed using a BALLOON Brodnax EMERGE MR 3.0X20.  Dist LM lesion is 80% stenosed.  Ost Cx to Prox Cx lesion is 100% stenosed.  Ost LAD to Prox LAD lesion is 100% stenosed.   1.  Severe underlying three-vessel coronary artery disease with occluded native vessels. 2.  Known chronically occluded SVG to right PDA.  LIMA to LAD is patent.  However, there is chronic diffuse disease in the mid and distal LAD.  SVG to diagonal is patent with no significant in-stent restenosis.  However, there is a borderline 60 to 70% lesion after the stent.  SVG to OM is patent with severe 95% in-stent restenosis proximally which is the likely culprit for myocardial infarction. 3.  Left ventricular angiography was not performed.  EF was 30 to 35% by echo.  Mildly elevated left ventricular end-diastolic pressure at 16 mmHg. 4.  Successful balloon angioplasty of SVG to OM for severe in-stent restenosis.  Recommendations: This was a very difficult and prolonged procedure from the left radial artery given inability to engage the SVG to OM.  There is significant tortuosity in the left subclavian artery.  I had to switch to a femoral approach. Avoid left radial catheterization in the future. Continue dual antiplatelet therapy indefinitely if  possible. Aggressive treatment of risk factors. The lesion in the SVG to diagonal can likely be monitored for now.  PCI can be considered if she has residual angina.  TTE 01/09/20 IMPRESSIONS  1. Left ventricular ejection fraction, by estimation, is 30 to 35%. The  left ventricle has moderately decreased function. The left ventricle has  no regional wall motion abnormalities. The left ventricular internal  cavity size was mildly dilated. Left  ventricular diastolic parameters are consistent with Grade II diastolic  dysfunction (pseudonormalization). Elevated left ventricular end-diastolic  pressure. There is akinesis of the left ventricular, entire inferior wall,  inferolateral wall and apical  segment.  2. Right ventricular systolic function is normal. The right ventricular  size is normal. There is normal pulmonary artery systolic pressure.  3. Left atrial size was mildly dilated.  4. The mitral valve is normal in structure. Mild mitral valve  regurgitation. No evidence of mitral stenosis.  5. The aortic valve is tricuspid. Aortic valve regurgitation is not  visualized. Mild aortic valve sclerosis is present, with no evidence of  aortic valve stenosis.  6. The inferior vena cava is normal in size with greater than 50%  respiratory variability, suggesting right atrial pressure of 3 mmHg.   LHC 10/28/17:  Severe three vessel CAD.  Prox LAD to Mid LAD lesion is 100% stenosed. LIMA to LAD is patent.  1st Diag-1 lesion is 100% stenosed. SVG to diagonal is patent with 80% stenosis.  A drug-eluting stent was successfully placed using a STENT RESOLUTE ONYX 2.5X12 in the SVG to diagonal.  Post intervention, there is a 0% residual stenosis.  Dist LAD lesion is 75% stenosed past LIMA insertion.  2nd Mrg lesion is 100% stenosed.  Ost 1st Mrg to 1st Mrg  lesion is 100% stenosed. SVG to OM is occluded.  Post intervention, there is a 0% residual stenosis.  A drug-eluting stent  was successfully placed using a STENT SYNERGY DES 3X20. This appears to be a fresh occlusion and was successfully treated.  Mid RCA to Dist RCA lesion is 100% stenosed. SVG to PDA is occluded,  LIMA and is normal in caliber.  Ost Cx to Dist Cx lesion is 70% stenosed.  The left ventricular ejection fraction is 45-50% by visual estimate.  There is mild left ventricular systolic dysfunction.  LV end diastolic pressure is moderately elevated.  There is no aortic valve stenosis.   Successful two vessel PCI to the SVG to diagonal and SVG to OM, with DES.  Continue dual antiplatelet therapy for at least one year and possibly clopidogrel indefinitely going forward.  Finish current bag of angiomax.    EKG:  EKG is  ordered today.  The ekg ordered today demonstrates sinus rhythm, v-paced with RBBB consistent with CRT-D device  Recent Labs: 01/08/2020: B Natriuretic Peptide 655.6 01/12/2020: ALT 20; Hemoglobin 13.5; Platelets 185 03/13/2020: TSH 0.860 07/16/2020: BUN 14; Creatinine, Ser 0.94; Potassium 4.1; Sodium 141  Recent Lipid Panel    Component Value Date/Time   CHOL 168 01/11/2020 0332   TRIG 151 (H) 01/11/2020 0332   HDL 38 (L) 01/11/2020 0332   CHOLHDL 4.4 01/11/2020 0332   VLDL 30 01/11/2020 0332   LDLCALC 100 (H) 01/11/2020 0332   LDLDIRECT 155.1 06/05/2012 1340     Physical Exam:    VS:  BP 130/60   Pulse 75   Ht 5\' 2"  (1.575 m)   Wt 180 lb (81.6 kg)   SpO2 97%   BMI 32.92 kg/m     Wt Readings from Last 3 Encounters:  08/05/20 180 lb (81.6 kg)  07/16/20 177 lb (80.3 kg)  07/03/20 172 lb 3.2 oz (78.1 kg)     GEN:  Well nourished, well developed in no acute distress HEENT: Normal NECK: No JVD; No carotid bruits CARDIAC: RRR, no murmurs, rubs, gallops RESPIRATORY:  Clear to auscultation without rales, wheezing or rhonchi  ABDOMEN: Soft, non-tender, non-distended MUSCULOSKELETAL:  No edema; No deformity  SKIN: Warm and dry NEUROLOGIC:  Alert and oriented x  3 PSYCHIATRIC:  Normal affect   ASSESSMENT:    1. Chronic systolic congestive heart failure ([place])   2. Coronary artery disease involving native coronary artery of native heart without angina pectoris   3. Hypertensive heart disease with chronic systolic congestive heart failure ([place])   4. Medication management   5. Paroxysmal atrial fibrillation ([facility])   6. Hyperlipidemia, unspecified hyperlipidemia type   7. Type 2 diabetes mellitus with complication, with long-term current use of insulin ([facility])    PLAN:    In order of problems listed above:  #Chronic systolic and diastolic heart failure: EF 30-35%. NYHA class II symptoms. Appears euvolemic on exam with normal impedences on CorVue.  -Continue entresto 24/26mg  -Continue metop 50mg  XL -Start Spironolactone 25mg ; repeat BMET in 1 week to monitor electrolytes and renal function -Start jardiance -S/p CRT-D and followed by Dr. [name] with normal functioning -Monitor daily weights -Low Na diet  #Severe multivessel CAD s/p CABG in 2000 with subsequent PCIs: Last cath 12/2019. LIMA-LAD (patent) with distal diffuse LAD disease; SVG-RCA known occluded; SVG to diag is s/p PCI with patent stent but 60-70% stenosis after stent. SVG-OM with recent POBA for in-stent restenosis.  -NO FUTURE CATHS FROM LEFT RADIAL due to severe tortuosity  of SCA -Continue ASA 81mg  and plavix 75mg  indefinitely -Will start protonix as patient reports GI upset with ASA; no bleeding issues -Will need to monitor for anginal symptoms due to known 60-70% stenosis of SVG-Diag  -Continue high dose statin  #Paroxysmal Afib: CHADs-vasc 8. Not on AC due to low Afib burden. -Follows with Dr. [name] -CRT-D in place as above -Continue metop 50mg  XL  #HTN: Well controlled. -Continue metop 50mg  XL -Continue entresto 24/26mg  BID -Start spironolactone as above  #HLD: -Continue crestor 40mg  daily  #DMII: Poorly controlled. On insulin. -Continue insulin -Start jardiance  as above -Management per primary care    Medication Adjustments/Labs and Tests Ordered: Current medicines are reviewed at length with the patient today.  Concerns regarding medicines are outlined above.  Orders Placed This Encounter  Procedures  . Basic metabolic panel  . EKG 12-Lead   Meds ordered this encounter  Medications  . spironolactone (ALDACTONE) 25 MG tablet    Sig: Take 1 tablet (25 mg total) by mouth daily.    Dispense:  90 tablet    Refill:  1  . empagliflozin (JARDIANCE) 10 MG TABS tablet    Sig: Take 1 tablet (10 mg total) by mouth daily before breakfast.    Dispense:  90 tablet    Refill:  1  . pantoprazole (PROTONIX) 40 MG tablet    Sig: Take 1 tablet (40 mg total) by mouth daily.    Dispense:  90 tablet    Refill:  1    Patient Instructions  Medication Instructions:   START TAKING SPIRONOLACTONE 25 MG BY MOUTH DAILY  START TAKING JARDIANCE 10 MG BY MOUTH DAILY BEFORE BREAKFAST  START TAKING PANTOPRAZOLE (PROTONIX) 40 MG BY MOUTH DAILY  PLEASE REMEMBER TO TAKE YOUR ASPIRIN EVERYDAY  *If you need a refill on your cardiac medications before your next appointment, please call your pharmacy*   Lab Work:  IN ONE WEEK TO CHECK A BMET  If you have labs (blood work) drawn today and your tests are completely normal, you will receive your results only by: [name] MyChart Message (if you have MyChart) OR . A paper copy in the mail If you have any lab test that is abnormal or we need to change your treatment, we will call you to review the results.   Follow-Up:  ONE MONTH IN THE OFFICE WITH DR. Johney Frame      Signed, Freada Bergeron, MD  08/05/2020 5:10 PM    [facility]

## 2020-08-04 ENCOUNTER — Ambulatory Visit (INDEPENDENT_AMBULATORY_CARE_PROVIDER_SITE_OTHER): Payer: Medicare HMO

## 2020-08-04 DIAGNOSIS — I5022 Chronic systolic (congestive) heart failure: Secondary | ICD-10-CM

## 2020-08-04 DIAGNOSIS — Z95810 Presence of automatic (implantable) cardiac defibrillator: Secondary | ICD-10-CM

## 2020-08-05 ENCOUNTER — Encounter: Payer: Self-pay | Admitting: Cardiology

## 2020-08-05 ENCOUNTER — Ambulatory Visit: Payer: Medicare HMO | Admitting: Cardiology

## 2020-08-05 ENCOUNTER — Other Ambulatory Visit: Payer: Self-pay

## 2020-08-05 VITALS — BP 130/60 | HR 75 | Ht 62.0 in | Wt 180.0 lb

## 2020-08-05 DIAGNOSIS — Z794 Long term (current) use of insulin: Secondary | ICD-10-CM | POA: Diagnosis not present

## 2020-08-05 DIAGNOSIS — I2510 Atherosclerotic heart disease of native coronary artery without angina pectoris: Secondary | ICD-10-CM | POA: Diagnosis not present

## 2020-08-05 DIAGNOSIS — E785 Hyperlipidemia, unspecified: Secondary | ICD-10-CM

## 2020-08-05 DIAGNOSIS — Z79899 Other long term (current) drug therapy: Secondary | ICD-10-CM | POA: Diagnosis not present

## 2020-08-05 DIAGNOSIS — I480 Paroxysmal atrial fibrillation: Secondary | ICD-10-CM | POA: Diagnosis not present

## 2020-08-05 DIAGNOSIS — I5022 Chronic systolic (congestive) heart failure: Secondary | ICD-10-CM | POA: Diagnosis not present

## 2020-08-05 DIAGNOSIS — I110 Hypertensive heart disease with heart failure: Secondary | ICD-10-CM

## 2020-08-05 DIAGNOSIS — E118 Type 2 diabetes mellitus with unspecified complications: Secondary | ICD-10-CM

## 2020-08-05 MED ORDER — EMPAGLIFLOZIN 10 MG PO TABS: 10.0000 mg | ORAL_TABLET | Freq: Every day | ORAL | 1 refills | Status: DC

## 2020-08-05 MED ORDER — PANTOPRAZOLE SODIUM 40 MG PO TBEC: 40.0000 mg | DELAYED_RELEASE_TABLET | Freq: Every day | ORAL | 1 refills | Status: DC

## 2020-08-05 MED ORDER — SPIRONOLACTONE 25 MG PO TABS: 25.0000 mg | ORAL_TABLET | Freq: Every day | ORAL | 1 refills | Status: DC

## 2020-08-05 NOTE — Patient Instructions (Signed)
Medication Instructions:   START TAKING SPIRONOLACTONE 25 MG BY MOUTH DAILY  START TAKING JARDIANCE 10 MG BY MOUTH DAILY BEFORE BREAKFAST  START TAKING PANTOPRAZOLE (PROTONIX) 40 MG BY MOUTH DAILY  PLEASE REMEMBER TO TAKE YOUR ASPIRIN EVERYDAY  *If you need a refill on your cardiac medications before your next appointment, please call your pharmacy*   Lab Work:  IN ONE WEEK TO CHECK A BMET  If you have labs (blood work) drawn today and your tests are completely normal, you will receive your results only by: [name] MyChart Message (if you have MyChart) OR . A paper copy in the mail If you have any lab test that is abnormal or we need to change your treatment, we will call you to review the results.   Follow-Up:  ONE MONTH IN THE OFFICE WITH DR. Johney Frame

## 2020-08-05 NOTE — Progress Notes (Signed)
EPIC Encounter for ICM Monitoring  Patient Name: Laurie [name] is a 74 y.o. female Date: 08/05/2020 Primary Care Physican: Haywood Pao, MD Primary Cardiologist:Klein Electrophysiologist:Klein Bi-V Pacing:99% 07/03/2020 Weight: 172 lbs    Transmission Reviewed.  CorVueThoracic impedancesuggesting fluid levels returned to normal after total of 36 days of possible fluid accumulation in last 2 months.  Prescribed:Not on diuretic  Potassium 20 mEq take 1 tablet daily.  Labs: 07/16/2020 Creatinine 0.94, BUN 14, Potassium 4.1, Sodium 141, GFR 60-70 02/20/2020 Creatinine 0.88, BUN 24, Potassium 4.3, Sodium 138, GFR 65-75 01/30/2020 Creatinine 1.2, BUN 7, Potassium 3.8, Sodium 133, GFR 44-53.3 A complete set of results can be found in Results Review.  Recommendations: Patient seen in cardiology office today by Dr Johney Frame.  Follow-up plan: ICM clinic phone appointment on12/03/2020. 91 day device clinic remote transmission12/09/2019.  EP/Cardiology Office Visits:Dr Johney Frame 08/05/2020  Copy of ICM check sent to [place] and FYI for Dr Johney Frame since she seeing patient in office today, 11/23/201   3 month ICM trend: 08/04/2020    1 Year ICM trend:       [name], RN 08/05/2020 4:10 PM

## 2020-08-06 ENCOUNTER — Other Ambulatory Visit: Payer: Medicare HMO

## 2020-08-13 ENCOUNTER — Ambulatory Visit (INDEPENDENT_AMBULATORY_CARE_PROVIDER_SITE_OTHER): Payer: Medicare HMO

## 2020-08-13 DIAGNOSIS — I442 Atrioventricular block, complete: Secondary | ICD-10-CM | POA: Diagnosis not present

## 2020-08-13 LAB — CUP PACEART REMOTE DEVICE CHECK
Battery Remaining Longevity: 32 mo
Battery Remaining Percentage: 38 %
Battery Voltage: 2.92 V
Brady Statistic AP VP Percent: 15 %
Brady Statistic AP VS Percent: 1 %
Brady Statistic AS VP Percent: 85 %
Brady Statistic AS VS Percent: 1 %
Brady Statistic RA Percent Paced: 15 %
Date Time Interrogation Session: 20211201034621
HighPow Impedance: 66 Ohm
HighPow Impedance: 66 Ohm
Implantable Lead Implant Date: 20140210
Implantable Lead Implant Date: 20170222
Implantable Lead Implant Date: 20170222
Implantable Lead Location: 753858
Implantable Lead Location: 753859
Implantable Lead Location: 753860
Implantable Lead Model: 4298
Implantable Lead Model: 5076
Implantable Lead Model: 7122
Implantable Pulse Generator Implant Date: 20170222
Lead Channel Impedance Value: 350 Ohm
Lead Channel Impedance Value: 380 Ohm
Lead Channel Impedance Value: 460 Ohm
Lead Channel Pacing Threshold Amplitude: 0.625 V
Lead Channel Pacing Threshold Amplitude: 0.75 V
Lead Channel Pacing Threshold Amplitude: 0.875 V
Lead Channel Pacing Threshold Pulse Width: 0.5 ms
Lead Channel Pacing Threshold Pulse Width: 0.5 ms
Lead Channel Pacing Threshold Pulse Width: 0.5 ms
Lead Channel Sensing Intrinsic Amplitude: 12 mV
Lead Channel Sensing Intrinsic Amplitude: 3.1 mV
Lead Channel Setting Pacing Amplitude: 2 V
Lead Channel Setting Pacing Amplitude: 2 V
Lead Channel Setting Pacing Amplitude: 2 V
Lead Channel Setting Pacing Pulse Width: 0.5 ms
Lead Channel Setting Pacing Pulse Width: 0.5 ms
Lead Channel Setting Sensing Sensitivity: 0.5 mV
Pulse Gen Serial Number: 7341405

## 2020-08-14 ENCOUNTER — Ambulatory Visit (INDEPENDENT_AMBULATORY_CARE_PROVIDER_SITE_OTHER): Payer: Medicare HMO

## 2020-08-14 DIAGNOSIS — Z95810 Presence of automatic (implantable) cardiac defibrillator: Secondary | ICD-10-CM

## 2020-08-14 DIAGNOSIS — I5022 Chronic systolic (congestive) heart failure: Secondary | ICD-10-CM

## 2020-08-15 DIAGNOSIS — E7800 Pure hypercholesterolemia, unspecified: Secondary | ICD-10-CM | POA: Diagnosis not present

## 2020-08-15 DIAGNOSIS — E1151 Type 2 diabetes mellitus with diabetic peripheral angiopathy without gangrene: Secondary | ICD-10-CM | POA: Diagnosis not present

## 2020-08-15 DIAGNOSIS — Z23 Encounter for immunization: Secondary | ICD-10-CM | POA: Diagnosis not present

## 2020-08-15 DIAGNOSIS — I679 Cerebrovascular disease, unspecified: Secondary | ICD-10-CM | POA: Diagnosis not present

## 2020-08-15 DIAGNOSIS — Z794 Long term (current) use of insulin: Secondary | ICD-10-CM | POA: Diagnosis not present

## 2020-08-15 DIAGNOSIS — E261 Secondary hyperaldosteronism: Secondary | ICD-10-CM | POA: Diagnosis not present

## 2020-08-15 DIAGNOSIS — E1165 Type 2 diabetes mellitus with hyperglycemia: Secondary | ICD-10-CM | POA: Diagnosis not present

## 2020-08-15 DIAGNOSIS — F0390 Unspecified dementia without behavioral disturbance: Secondary | ICD-10-CM | POA: Diagnosis not present

## 2020-08-15 DIAGNOSIS — E1140 Type 2 diabetes mellitus with diabetic neuropathy, unspecified: Secondary | ICD-10-CM | POA: Diagnosis not present

## 2020-08-15 NOTE — Progress Notes (Signed)
EPIC Encounter for ICM Monitoring  Patient Name: Laurie [name] is a 74 y.o. female Date: 08/15/2020 Primary Care Physican: Haywood Pao, MD Primary Cardiologist:Klein Electrophysiologist:Klein Bi-V Pacing:>99% 07/03/2020 Weight: 172lbs    Transmission Reviewed.  CorVueThoracic impedancesuggesting normal fluid levels.  Prescribed:Not on diuretic  Labs: 07/16/2020 Creatinine 0.94, BUN 14, Potassium 4.1, Sodium 141, GFR 60-70 02/20/2020 Creatinine 0.88, BUN 24, Potassium 4.3, Sodium 138, GFR 65-75 01/30/2020 Creatinine 1.2, BUN 7, Potassium 3.8, Sodium 133, GFR 44-53.3 A complete set of results can be found in Results Review.  Recommendations: Patient seen in cardiology office today by Dr Johney Frame.  Follow-up plan: ICM clinic phone appointment on1/11/2020. 91 day device clinic remote transmission3/10/2020.  EP/Cardiology Office Visits:08/28/2020 with Dr Johney Frame   Copy of ICM check sent to [place] .   3 month ICM trend: 08/13/2020    1 Year ICM trend:       [name], RN 08/15/2020 5:12 PM

## 2020-08-18 DIAGNOSIS — R82998 Other abnormal findings in urine: Secondary | ICD-10-CM | POA: Diagnosis not present

## 2020-08-21 NOTE — Progress Notes (Signed)
Remote ICD transmission.   

## 2020-08-25 NOTE — Progress Notes (Deleted)
Cardiology Office Note:    Date:  08/25/2020   ID:  [name], DOB [birth year], MRN [id number]  PCP:  Haywood Pao, MD  [facility] Cardiologist:  Ezzard Standing, MD  [facility] Electrophysiologist:  None   Referring MD: Haywood Pao, MD    History of Present Illness:    Laurie [name] is a 74 y.o. female with a hx of CAD (CABG 2000 >>12/2019 POBA to SVG OM; PCI to SVG), Bells Palsy, HTN, HLD, chronic systolic heart failure, AFib, CVA (left with chronic balance problems), OSA (uses CPAP), DM, CHB s/p SJM CRT-D 11/05/2015 (RV/LV leads 2017), RA lead 2014 who presents to clinic for follow-up.  Past Medical History:  Diagnosis Date  . AICD (automatic cardioverter/defibrillator) present   . Bell's palsy 01/05/2010  . CAD (coronary artery disease)    Cath  07/21/1999  mild ostial, 80% stenosis proximal LAD, 90% stenosis proximal Diag 1, 95% stenosis proximal OM 1, 80% stenosis proximal RCA, 95% stenosis mid RCA    CABG 07/22/99  LIMA to LAD, SVG to dx, SVG to OM, SVG to PDA  Dr. [name]   Cardiolite 2012 no ischemia EF 67%  Cath 10/23/12 Severe native three-vessel coronary artery disease with occlusion of all 3 native coronary arteries, Patent sap  . Chronic lower back pain   . Chronic systolic CHF (congestive heart failure) ([place]) 10/22/2015  . Cystitis   . Gait abnormality   . Hepatitis 1970s   "don't know which" (03/17/2017)  . HIstory of Bell's palsy    October 2012   . Hyperlipidemia    Intolerance to several statins   . Hypertensive heart disease without CHF   . Hypoxia 01/09/2020  . Memory loss   . Multinodular goiter   . Obesity (BMI 30-39.9)   . OSA on CPAP    "suppose to wear a mask" (03/17/2017)  . Peripheral neuropathy   . Pneumonia 1990s X 1   "think i had walking pneumonia"  . Seasonal allergies   . Stroke ([place]) 03/2017   "light one"; denies residual on 03/17/2017)  . Type II diabetes mellitus ([place])     Past Surgical History:  Procedure  Laterality Date  . BI-VENTRICULAR PACEMAKER UPGRADE  11/05/2015   "upgraded my pacemaker"  . BIOPSY THYROID  05/2010   percutaneous  . CARDIAC CATHETERIZATION N/A 10/21/2015   Procedure: Left Heart Cath and Cors/Grafts Angiography;  Surgeon: Belva Crome, MD;  Location: [place] [facility];  Service: Cardiovascular;  Laterality: N/A;  . CARPAL TUNNEL RELEASE Bilateral   . CATARACT EXTRACTION Right 01/06/2015  . CATARACT EXTRACTION W/ INTRAOCULAR LENS IMPLANT Left 12/16/2014  . CORONARY ARTERY BYPASS GRAFT  2000  . CORONARY BALLOON ANGIOPLASTY N/A 01/09/2020   Procedure: CORONARY BALLOON ANGIOPLASTY;  Surgeon: Wellington Hampshire, MD;  Location: [place] [facility];  Service: Cardiovascular;  Laterality: N/A;  . CORONARY STENT INTERVENTION N/A 10/28/2017   Procedure: CORONARY STENT INTERVENTION;  Surgeon: Jettie Booze, MD;  Location: [place] [facility];  Service: Cardiovascular;  Laterality: N/A;  . CYSTECTOMY Right    "middle finger"  . DENTAL IMPLANTS    . EP IMPLANTABLE DEVICE N/A 11/05/2015   Procedure: BiV Upgrade;  Surgeon: Deboraha Sprang, MD;  Location: [place] [facility];  Service: Cardiovascular;  Laterality: N/A;  . INSERT / REPLACE / REMOVE PACEMAKER    . LEAD REVISION N/A 10/25/2012   Procedure: LEAD REVISION;  Surgeon: Evans Lance, MD;  Location:  [place] [facility];  Service: Cardiovascular;  Laterality: N/A;  . LEFT HEART CATH AND CORS/GRAFTS ANGIOGRAPHY N/A 10/28/2017   Procedure: LEFT HEART CATH AND CORS/GRAFTS ANGIOGRAPHY;  Surgeon: Jettie Booze, MD;  Location: [place] [facility];  Service: Cardiovascular;  Laterality: N/A;  . LEFT HEART CATH AND CORS/GRAFTS ANGIOGRAPHY N/A Jan 13, 2020   Procedure: LEFT HEART CATH AND CORS/GRAFTS ANGIOGRAPHY;  Surgeon: Wellington Hampshire, MD;  Location: [place] [facility];  Service: Cardiovascular;  Laterality: N/A;  . LEFT HEART CATHETERIZATION WITH CORONARY ANGIOGRAM N/A 10/23/2012   Procedure: LEFT HEART CATHETERIZATION WITH CORONARY  ANGIOGRAM;  Surgeon: Jacolyn Reedy, MD;  Location: [facility] C[facility] LAB;  Service: Cardiovascular;  Laterality: N/A;  . PERMANENT PACEMAKER INSERTION N/A 10/23/2012   Procedure: PERMANENT PACEMAKER INSERTION;  Surgeon: Deboraha Sprang, MD;  Location: [facility];  Service: Cardiovascular;  Laterality: N/A;  . PLANTAR FASCIA RELEASE Left    "& clipped a tendon that went thru bottom of my foot"  . TEMPORARY PACEMAKER INSERTION N/A 10/22/2012   Procedure: TEMPORARY PACEMAKER INSERTION;  Surgeon: Troy Sine, MD;  Location: [facility] CATH LAB;  Service: Cardiovascular;  Laterality: N/A;  . TUBAL LIGATION      Current Medications: No outpatient medications have been marked as taking for the 08/28/20 encounter (Appointment) with Freada Bergeron, MD.     Allergies:   Brilinta [ticagrelor], Statins, Tricor [fenofibrate], Latex, and Tape   Social History   Socioeconomic History  . Marital status: Married    Spouse name: Not on file  . Number of children: 2  . Years of education: 76  . Highest education level: High school graduate  Occupational History    Employer: RETIRED  Tobacco Use  . Smoking status: Never Smoker  . Smokeless tobacco: Never Used  Vaping Use  . Vaping Use: Never used  Substance and Sexual Activity  . Alcohol use: No    Alcohol/week: 0.0 standard drinks  . Drug use: No  . Sexual activity: Not Currently  Other Topics Concern  . Not on file  Social History Narrative   Lives at home with her husband.   Right-handed.   One cup caffeine per day.   Social Determinants of Health   Financial Resource Strain: Not on file  Food Insecurity: Not on file  Transportation Needs: Not on file  Physical Activity: Not on file  Stress: Not on file  Social Connections: Not on file     Family History: The patient's ***family history includes Diabetes in her father, mother, and another family member; Heart disease in her father and another family member. There is no history of Colon  cancer or Colon polyps.  ROS:   Please see the history of present illness.    *** All other systems reviewed and are negative.  EKGs/Labs/Other Studies Reviewed:    The following studies were reviewed today: [facility] Jan 13, 2020:  Mid RCA lesion is 100% stenosed.  Ost RCA to Prox RCA lesion is 50% stenosed.  Previously placed Prox Graft stent (unknown type) is widely patent.  Mid Graft lesion is 65% stenosed.  Origin lesion is 100% stenosed.  LIMA and is normal in caliber.  The graft exhibits no disease.  Mid LAD to Dist LAD lesion is 80% stenosed.  Origin to Prox Graft lesion is 95% stenosed.  Post intervention, there is a 10% residual stenosis.  Balloon angioplasty was performed using a BALLOON [place] EMERGE MR 3.0X20.  Dist LM lesion is 80% stenosed.  Ost Cx  to Prox Cx lesion is 100% stenosed.  Ost LAD to Prox LAD lesion is 100% stenosed.  1. Severe underlying three-vessel coronary artery disease with occluded native vessels. 2. Known chronically occluded SVG to right PDA. LIMA to LAD is patent. However, there is chronic diffuse disease in the mid and distal LAD. SVG to diagonal is patent with no significant in-stent restenosis. However, there is a borderline 60 to 70% lesion after the stent. SVG to OM is patent with severe 95% in-stent restenosis proximally which is the likely culprit for myocardial infarction. 3. Left ventricular angiography was not performed. EF was 30 to 35% by echo. Mildly elevated left ventricular end-diastolic pressure at 16 mmHg. 4. Successful balloon angioplasty of SVG to OM for severe in-stent restenosis.  Recommendations: This was a very difficult and prolonged procedure from the left radial artery given inability to engage the SVG to OM. There is significant tortuosity in the left subclavian artery. I had to switch to a femoral approach. Avoid left radial catheterization in the future. Continue dual antiplatelet therapy indefinitely if  possible. Aggressive treatment of risk factors. The lesion in the SVG to diagonal can likely be monitored for now. PCI can be considered if she has residual angina.  TTE 01/09/20 IMPRESSIONS  1. Left ventricular ejection fraction, by estimation, is 30 to 35%. The  left ventricle has moderately decreased function. The left ventricle has  no regional wall motion abnormalities. The left ventricular internal  cavity size was mildly dilated. Left  ventricular diastolic parameters are consistent with Grade II diastolic  dysfunction (pseudonormalization). Elevated left ventricular end-diastolic  pressure. There is akinesis of the left ventricular, entire inferior wall,  inferolateral wall and apical  segment.  2. Right ventricular systolic function is normal. The right ventricular  size is normal. There is normal pulmonary artery systolic pressure.  3. Left atrial size was mildly dilated.  4. The mitral valve is normal in structure. Mild mitral valve  regurgitation. No evidence of mitral stenosis.  5. The aortic valve is tricuspid. Aortic valve regurgitation is not  visualized. Mild aortic valve sclerosis is present, with no evidence of  aortic valve stenosis.  6. The inferior vena cava is normal in size with greater than 50%  respiratory variability, suggesting right atrial pressure of 3 mmHg.   LHC 10/28/17:  Severe three vessel CAD.  Prox LAD to Mid LAD lesion is 100% stenosed. LIMA to LAD is patent.  1st Diag-1 lesion is 100% stenosed. SVG to diagonal is patent with 80% stenosis.  A drug-eluting stent was successfully placed using a STENT RESOLUTE ONYX 2.5X12 in the SVG to diagonal.  Post intervention, there is a 0% residual stenosis.  Dist LAD lesion is 75% stenosed past LIMA insertion.  2nd Mrg lesion is 100% stenosed.  Ost 1st Mrg to 1st Mrg lesion is 100% stenosed. SVG to OM is occluded.  Post intervention, there is a 0% residual stenosis.  A drug-eluting stent  was successfully placed using a STENT SYNERGY DES 3X20. This appears to be a fresh occlusion and was successfully treated.  Mid RCA to Dist RCA lesion is 100% stenosed. SVG to PDA is occluded,  LIMA and is normal in caliber.  Ost Cx to Dist Cx lesion is 70% stenosed.  The left ventricular ejection fraction is 45-50% by visual estimate.  There is mild left ventricular systolic dysfunction.  LV end diastolic pressure is moderately elevated.  There is no aortic valve stenosis.  Successful two vessel PCI to the  SVG to diagonal and SVG to OM, with DES. Continue dual antiplatelet therapy for at least one year and possibly clopidogrel indefinitely going forward. Finish current bag of angiomax.   EKG:  EKG is  ordered today.  The ekg ordered today demonstrates sinus rhythm, v-paced with RBBB consistent with CRT-D device  EKG:  EKG is *** ordered today.  The ekg ordered today demonstrates ***  Recent Labs: 01/08/2020: B Natriuretic Peptide 655.6 01/12/2020: ALT 20; Hemoglobin 13.5; Platelets 185 03/13/2020: TSH 0.860 07/16/2020: BUN 14; Creatinine, Ser 0.94; Potassium 4.1; Sodium 141  Recent Lipid Panel    Component Value Date/Time   CHOL 168 01/11/2020 0332   TRIG 151 (H) 01/11/2020 0332   HDL 38 (L) 01/11/2020 0332   CHOLHDL 4.4 01/11/2020 0332   VLDL 30 01/11/2020 0332   LDLCALC 100 (H) 01/11/2020 0332   LDLDIRECT 155.1 06/05/2012 1340     Risk Assessment/Calculations:   {Does this patient have ATRIAL FIBRILLATION?:[phone]}   Physical Exam:    VS:  There were no vitals taken for this visit.    Wt Readings from Last 3 Encounters:  08/05/20 180 lb (81.6 kg)  07/16/20 177 lb (80.3 kg)  07/03/20 172 lb 3.2 oz (78.1 kg)     GEN: *** Well nourished, well developed in no acute distress HEENT: Normal NECK: No JVD; No carotid bruits LYMPHATICS: No lymphadenopathy CARDIAC: ***RRR, no murmurs, rubs, gallops RESPIRATORY:  Clear to auscultation without rales, wheezing or  rhonchi  ABDOMEN: Soft, non-tender, non-distended MUSCULOSKELETAL:  No edema; No deformity  SKIN: Warm and dry NEUROLOGIC:  Alert and oriented x 3 PSYCHIATRIC:  Normal affect   ASSESSMENT:    No diagnosis found. PLAN:    In order of problems listed above:  #Chronic systolic and diastolic heart failure: EF 30-35%. NYHA class II symptoms. Appears euvolemic on exam with normal impedences on CorVue.  -Continue entresto 24/26mg  -Continue metop 50mg  XL -Start Spironolactone 25mg ; repeat BMET in 1 week to monitor electrolytes and renal function -Start jardiance -S/p CRT-D and followed by Dr. [name] with normal functioning -Monitor daily weights -Low Na diet  #Severe multivessel CAD s/p CABG in 2000 with subsequent PCIs: Last cath 12/2019. LIMA-LAD (patent) with distal diffuse LAD disease; SVG-RCA known occluded; SVG to diag is s/p PCI with patent stent but 60-70% stenosis after stent. SVG-OM with recent POBA for in-stent restenosis.  -NO FUTURE CATHS FROM LEFT RADIAL due to severe tortuosity of SCA -Continue ASA 81mg  and plavix 75mg  indefinitely -Will start protonix as patient reports GI upset with ASA; no bleeding issues -Will need to monitor for anginal symptoms due to known 60-70% stenosis of SVG-Diag  -Continue high dose statin  #Paroxysmal Afib: CHADs-vasc 8. Not on AC due to low Afib burden. -Follows with Dr. [name] -CRT-D in place as above -Continue metop 50mg  XL  #HTN: Well controlled. -Continue metop 50mg  XL -Continue entresto 24/26mg  BID -Start spironolactone as above  #HLD: -Continue crestor 40mg  daily  #DMII: Poorly controlled. On insulin. -Continue insulin -Start jardiance as above -Management per primary care   {Are you ordering a CV Procedure (e.g. stress test, cath, DCCV, TEE, etc)?   Press F2        :[id number]}    Medication Adjustments/Labs and Tests Ordered: Current medicines are reviewed at length with the patient today.  Concerns regarding  medicines are outlined above.  No orders of the defined types were placed in this encounter.  No orders of the defined types were placed in this encounter.  There are no Patient Instructions on file for this visit.   Signed, Freada Bergeron, MD  08/25/2020 7:00 PM    [place]

## 2020-08-28 ENCOUNTER — Ambulatory Visit: Payer: Medicare HMO | Admitting: Cardiology

## 2020-09-15 ENCOUNTER — Ambulatory Visit (INDEPENDENT_AMBULATORY_CARE_PROVIDER_SITE_OTHER): Payer: Medicare HMO

## 2020-09-15 DIAGNOSIS — I5022 Chronic systolic (congestive) heart failure: Secondary | ICD-10-CM | POA: Diagnosis not present

## 2020-09-15 DIAGNOSIS — Z95810 Presence of automatic (implantable) cardiac defibrillator: Secondary | ICD-10-CM

## 2020-09-16 NOTE — Progress Notes (Signed)
Cardiology Office Note:    Date:  09/25/2020   ID:  Laurie [name], DOB Dec 29, 1945, MRN [id number]  PCP:  Haywood Pao, MD  [facility] Cardiologist:  Freada Bergeron, MD  [facility] Electrophysiologist:  None   Referring MD: Haywood Pao, MD    History of Present Illness:    Laurie [name] is a 75 y.o. female with a hx of with a history of CAD (CABG 2000 >>12/2019 POBA to SVG OM; PCI to SVG), Bells Palsy, HTN, HLD, chronic systolic heart failure, AFib, CVA (left with chronic balance problems), OSA (uses CPAP), DM, CHB s/p SJM CRT-D 11/05/2015 (RV/LV leads 2017), RA lead 2014 who presents to clinic for follow-up.  During last visit on 08/05/20, she was doing well from a CV perspective. We added spironolactone and jardiance for GDMT for underlying heart failure.  Today, the patient states that she feels well. Continues to have intermittent fatigue, but improved from prior. She is moving a lot better and is not using her cane less often as she feels more stable on her feet. She has no LE edema and only takes lasix as need. No chest pain, SOB, orthopnea, PND. Has been taking medications as prescribed although she stopped spironolactone for unclear reasons. She did not have a reaction that she knows of and her Cr and K are stable.   Past Medical History:  Diagnosis Date  . AICD (automatic cardioverter/defibrillator) present   . Bell's palsy 01/05/2010  . CAD (coronary artery disease)    Cath  07/21/1999  mild ostial, 80% stenosis proximal LAD, 90% stenosis proximal Diag 1, 95% stenosis proximal OM 1, 80% stenosis proximal RCA, 95% stenosis mid RCA    CABG 07/22/99  LIMA to LAD, SVG to dx, SVG to OM, SVG to PDA  Dr. [name]   Cardiolite 2012 no ischemia EF 67%  Cath 10/23/12 Severe native three-vessel coronary artery disease with occlusion of all 3 native coronary arteries, Patent sap  . Chronic lower back pain   . Chronic systolic CHF (congestive heart failure) ([place])  10/22/2015  . Cystitis   . Gait abnormality   . Hepatitis 1970s   "don't know which" (03/17/2017)  . HIstory of Bell's palsy    October 2012   . Hyperlipidemia    Intolerance to several statins   . Hypertensive heart disease without CHF   . Hypoxia 01/09/2020  . Memory loss   . Multinodular goiter   . Obesity (BMI 30-39.9)   . OSA on CPAP    "suppose to wear a mask" (03/17/2017)  . Peripheral neuropathy   . Pneumonia 1990s X 1   "think i had walking pneumonia"  . Seasonal allergies   . Stroke ([place]) 03/2017   "light one"; denies residual on 03/17/2017)  . Type II diabetes mellitus ([place])     Past Surgical History:  Procedure Laterality Date  . BI-VENTRICULAR PACEMAKER UPGRADE  11/05/2015   "upgraded my pacemaker"  . BIOPSY THYROID  05/2010   percutaneous  . CARDIAC CATHETERIZATION N/A 10/21/2015   Procedure: Left Heart Cath and Cors/Grafts Angiography;  Surgeon: Belva Crome, MD;  Location: [place] CV LAB;  Service: Cardiovascular;  Laterality: N/A;  . CARPAL TUNNEL RELEASE Bilateral   . CATARACT EXTRACTION Right 01/06/2015  . CATARACT EXTRACTION W/ INTRAOCULAR LENS IMPLANT Left 12/16/2014  . CORONARY ARTERY BYPASS GRAFT  2000  . CORONARY BALLOON ANGIOPLASTY N/A 01/09/2020   Procedure: CORONARY BALLOON ANGIOPLASTY;  Surgeon: Wellington Hampshire, MD;  Location: [place] [facility];  Service: Cardiovascular;  Laterality: N/A;  . CORONARY STENT INTERVENTION N/A 10/28/2017   Procedure: CORONARY STENT INTERVENTION;  Surgeon: Jettie Booze, MD;  Location: [place] [facility];  Service: Cardiovascular;  Laterality: N/A;  . CYSTECTOMY Right    "middle finger"  . DENTAL IMPLANTS    . EP IMPLANTABLE DEVICE N/A 11/05/2015   Procedure: BiV Upgrade;  Surgeon: Deboraha Sprang, MD;  Location: [place] [facility];  Service: Cardiovascular;  Laterality: N/A;  . INSERT / REPLACE / REMOVE PACEMAKER    . LEAD REVISION N/A 10/25/2012   Procedure: LEAD REVISION;  Surgeon: Evans Lance, MD;  Location:  [facility] CATH LAB;  Service: Cardiovascular;  Laterality: N/A;  . LEFT HEART CATH AND CORS/GRAFTS ANGIOGRAPHY N/A 10/28/2017   Procedure: LEFT HEART CATH AND CORS/GRAFTS ANGIOGRAPHY;  Surgeon: Jettie Booze, MD;  Location: [place] [facility];  Service: Cardiovascular;  Laterality: N/A;  . LEFT HEART CATH AND CORS/GRAFTS ANGIOGRAPHY N/A 01/09/2020   Procedure: LEFT HEART CATH AND CORS/GRAFTS ANGIOGRAPHY;  Surgeon: Wellington Hampshire, MD;  Location: [place] [facility];  Service: Cardiovascular;  Laterality: N/A;  . LEFT HEART CATHETERIZATION WITH CORONARY ANGIOGRAM N/A 10/23/2012   Procedure: LEFT HEART CATHETERIZATION WITH CORONARY ANGIOGRAM;  Surgeon: Jacolyn Reedy, MD;  Location: [facility] CATH LAB;  Service: Cardiovascular;  Laterality: N/A;  . PERMANENT PACEMAKER INSERTION N/A 10/23/2012   Procedure: PERMANENT PACEMAKER INSERTION;  Surgeon: Deboraha Sprang, MD;  Location: [facility] CATH LAB;  Service: Cardiovascular;  Laterality: N/A;  . PLANTAR FASCIA RELEASE Left    "& clipped a tendon that went thru bottom of my foot"  . TEMPORARY PACEMAKER INSERTION N/A 10/22/2012   Procedure: TEMPORARY PACEMAKER INSERTION;  Surgeon: Troy Sine, MD;  Location: [facility] CATH LAB;  Service: Cardiovascular;  Laterality: N/A;  . TUBAL LIGATION      Current Medications: Current Meds  Medication Sig  . ACCU-CHEK FASTCLIX LANCETS MISC USE  TO CHECK BLOOD SUGAR FOUR TIMES DAILY  . ACCU-CHEK SMARTVIEW test strip USE  TO CHECK BLOOD SUGAR FOUR TIMES DAILY  . Alcohol Swabs (B-D SINGLE USE SWABS REGULAR) PADS Use 8 daily for testing blood sugar and insulin injections.  [name] aspirin EC 81 MG tablet Take 81 mg by mouth daily. Swallow whole.  . BD VEO INSULIN SYRINGE U/F 31G X 15/64" 1 ML MISC USE 10 TIMES A DAY WITH INSULIN AS DIRECTED  . beta carotene w/minerals (OCUVITE) tablet Take 1 tablet by mouth daily.  . Blood Glucose Calibration (ACCU-CHEK SMARTVIEW CONTROL) LIQD Use to calibrate blood glucose meter.  . cetirizine (ZYRTEC) 10  MG tablet Take 1 tablet (10 mg total) by mouth daily.  . cholecalciferol (VITAMIN D) 1000 UNITS tablet Take 1,000 Units by mouth daily.   . clopidogrel (PLAVIX) 75 MG tablet Take 1 tablet (75 mg total) by mouth daily.  . Continuous Blood Gluc Sensor (FREESTYLE LIBRE 14 DAY SENSOR) MISC APPLY EVERY 14 DAYS  . empagliflozin (JARDIANCE) 10 MG TABS tablet Take 1 tablet (10 mg total) by mouth daily before breakfast.  . insulin glargine (LANTUS) 100 UNIT/ML injection Inject 30 Units into the skin daily.  . Insulin Pen Needle (BD PEN NEEDLE NANO U/F) 32G X 4 MM MISC 1 each by Does not apply route 3 (three) times daily. Use to inject insulin TID; E11.40  . ketoconazole (NIZORAL) 2 % cream Apply 1 application topically daily as needed for irritation. TO AFFECTED AREAS OF FACE  .  memantine (NAMENDA) 10 MG tablet Take 1 tablet (10 mg total) by mouth 2 (two) times daily.  . metoprolol succinate (TOPROL-XL) 50 MG 24 hr tablet Take 1 tablet (50 mg total) by mouth daily. Take with or immediately following a meal.  . nitroGLYCERIN (NITROSTAT) 0.4 MG SL tablet Place 0.4 mg under the tongue every 5 (five) minutes as needed for chest pain.   . pantoprazole (PROTONIX) 40 MG tablet Take 1 tablet (40 mg total) by mouth daily.  . psyllium (METAMUCIL) 58.6 % powder Take 1 packet by mouth daily as needed (fiber).   . rosuvastatin (CRESTOR) 40 MG tablet Take 40 mg by mouth daily.  . sacubitril-valsartan (ENTRESTO) 24-26 MG Take 1 tablet by mouth 2 (two) times daily.  . vitamin C (ASCORBIC ACID) 500 MG tablet Take 500 mg by mouth every other day.  . [DISCONTINUED] spironolactone (ALDACTONE) 25 MG tablet Take 1 tablet (25 mg total) by mouth daily.     Allergies:   Brilinta [ticagrelor], Statins, Tricor [fenofibrate], Latex, and Tape   Social History   Socioeconomic History  . Marital status: Married    Spouse name: Not on file  . Number of children: 2  . Years of education: 64  . Highest education level: High school  graduate  Occupational History    Employer: RETIRED  Tobacco Use  . Smoking status: Never Smoker  . Smokeless tobacco: Never Used  Vaping Use  . Vaping Use: Never used  Substance and Sexual Activity  . Alcohol use: No    Alcohol/week: 0.0 standard drinks  . Drug use: No  . Sexual activity: Not Currently  Other Topics Concern  . Not on file  Social History Narrative   Lives at home with her husband.   Right-handed.   One cup caffeine per day.   Social Determinants of Health   Financial Resource Strain: Not on file  Food Insecurity: Not on file  Transportation Needs: Not on file  Physical Activity: Not on file  Stress: Not on file  Social Connections: Not on file     Family History: The patient's family history includes Diabetes in her father, mother, and another family member; Heart disease in her father and another family member. There is no history of Colon cancer or Colon polyps.  ROS:   Please see the history of present illness.    Review of Systems  Constitutional: Negative for chills, diaphoresis and fever.  HENT: Negative for nosebleeds.   Eyes: Negative for blurred vision.  Respiratory: Negative for shortness of breath.   Cardiovascular: Negative for chest pain, palpitations, orthopnea, claudication, leg swelling and PND.  Gastrointestinal: Negative for nausea and vomiting.  Genitourinary: Negative for hematuria.  Musculoskeletal: Negative for falls.  Neurological: Negative for dizziness and loss of consciousness.  Psychiatric/Behavioral: Negative for substance abuse.    EKGs/Labs/Other Studies Reviewed:    The following studies were reviewed today: LHC 2020/02/03:  Mid RCA lesion is 100% stenosed.  Ost RCA to Prox RCA lesion is 50% stenosed.  Previously placed Prox Graft stent (unknown type) is widely patent.  Mid Graft lesion is 65% stenosed.  Origin lesion is 100% stenosed.  LIMA and is normal in caliber.  The graft exhibits no disease.  Mid  LAD to Dist LAD lesion is 80% stenosed.  Origin to Prox Graft lesion is 95% stenosed.  Post intervention, there is a 10% residual stenosis.  Balloon angioplasty was performed using a BALLOON Kaycee EMERGE MR 3.0X20.  Dist LM lesion is 80%  stenosed.  Ost Cx to Prox Cx lesion is 100% stenosed.  Ost LAD to Prox LAD lesion is 100% stenosed.  1. Severe underlying three-vessel coronary artery disease with occluded native vessels. 2. Known chronically occluded SVG to right PDA. LIMA to LAD is patent. However, there is chronic diffuse disease in the mid and distal LAD. SVG to diagonal is patent with no significant in-stent restenosis. However, there is a borderline 60 to 70% lesion after the stent. SVG to OM is patent with severe 95% in-stent restenosis proximally which is the likely culprit for myocardial infarction. 3. Left ventricular angiography was not performed. EF was 30 to 35% by echo. Mildly elevated left ventricular end-diastolic pressure at 16 mmHg. 4. Successful balloon angioplasty of SVG to OM for severe in-stent restenosis.  Recommendations: This was a very difficult and prolonged procedure from the left radial artery given inability to engage the SVG to OM. There is significant tortuosity in the left subclavian artery. I had to switch to a femoral approach. Avoid left radial catheterization in the future. Continue dual antiplatelet therapy indefinitely if possible. Aggressive treatment of risk factors. The lesion in the SVG to diagonal can likely be monitored for now. PCI can be considered if she has residual angina.  TTE 01/09/20 IMPRESSIONS  1. Left ventricular ejection fraction, by estimation, is 30 to 35%. The  left ventricle has moderately decreased function. The left ventricle has  no regional wall motion abnormalities. The left ventricular internal  cavity size was mildly dilated. Left  ventricular diastolic parameters are consistent with Grade II diastolic   dysfunction (pseudonormalization). Elevated left ventricular end-diastolic  pressure. There is akinesis of the left ventricular, entire inferior wall,  inferolateral wall and apical  segment.  2. Right ventricular systolic function is normal. The right ventricular  size is normal. There is normal pulmonary artery systolic pressure.  3. Left atrial size was mildly dilated.  4. The mitral valve is normal in structure. Mild mitral valve  regurgitation. No evidence of mitral stenosis.  5. The aortic valve is tricuspid. Aortic valve regurgitation is not  visualized. Mild aortic valve sclerosis is present, with no evidence of  aortic valve stenosis.  6. The inferior vena cava is normal in size with greater than 50%  respiratory variability, suggesting right atrial pressure of 3 mmHg.   LHC 10/28/17:  Severe three vessel CAD.  Prox LAD to Mid LAD lesion is 100% stenosed. LIMA to LAD is patent.  1st Diag-1 lesion is 100% stenosed. SVG to diagonal is patent with 80% stenosis.  A drug-eluting stent was successfully placed using a STENT RESOLUTE ONYX 2.5X12 in the SVG to diagonal.  Post intervention, there is a 0% residual stenosis.  Dist LAD lesion is 75% stenosed past LIMA insertion.  2nd Mrg lesion is 100% stenosed.  Ost 1st Mrg to 1st Mrg lesion is 100% stenosed. SVG to OM is occluded.  Post intervention, there is a 0% residual stenosis.  A drug-eluting stent was successfully placed using a STENT SYNERGY DES 3X20. This appears to be a fresh occlusion and was successfully treated.  Mid RCA to Dist RCA lesion is 100% stenosed. SVG to PDA is occluded,  LIMA and is normal in caliber.  Ost Cx to Dist Cx lesion is 70% stenosed.  The left ventricular ejection fraction is 45-50% by visual estimate.  There is mild left ventricular systolic dysfunction.  LV end diastolic pressure is moderately elevated.  There is no aortic valve stenosis.  Successful two vessel  PCI to the  SVG to diagonal and SVG to OM, with DES. Continue dual antiplatelet therapy for at least one year and possibly clopidogrel indefinitely going forward. Finish current bag of angiomax.   Recent Labs: 01/08/2020: B Natriuretic Peptide 655.6 01/12/2020: ALT 20; Hemoglobin 13.5; Platelets 185 03/13/2020: TSH 0.860 07/16/2020: BUN 14; Creatinine, Ser 0.94; Potassium 4.1; Sodium 141  Recent Lipid Panel    Component Value Date/Time   CHOL 168 01/11/2020 0332   TRIG 151 (H) 01/11/2020 0332   HDL 38 (L) 01/11/2020 0332   CHOLHDL 4.4 01/11/2020 0332   VLDL 30 01/11/2020 0332   LDLCALC 100 (H) 01/11/2020 0332   LDLDIRECT 155.1 06/05/2012 1340     Physical Exam:    VS:  BP (!) 154/70   Pulse 96   Ht 5\' 2"  (1.575 m)   Wt 176 lb 3.2 oz (79.9 kg)   SpO2 95%   BMI 32.23 kg/m     Wt Readings from Last 3 Encounters:  09/25/20 176 lb 3.2 oz (79.9 kg)  08/05/20 180 lb (81.6 kg)  07/16/20 177 lb (80.3 kg)     GEN:  Well nourished, well developed in no acute distress HEENT: Normal NECK: No JVD; No carotid bruits CARDIAC: RRR, no murmurs, rubs, gallops RESPIRATORY:  Clear to auscultation without rales, wheezing or rhonchi  ABDOMEN: Soft, non-tender, non-distended MUSCULOSKELETAL:  No edema; No deformity  SKIN: Warm and dry NEUROLOGIC:  Alert and oriented x 3 PSYCHIATRIC:  Normal affect   ASSESSMENT:    1. Chronic systolic congestive heart failure ([place])   2. Coronary artery disease involving native coronary artery of native heart without angina pectoris   3. Hypertensive heart disease with chronic systolic congestive heart failure ([place])   4. Medication management    PLAN:    In order of problems listed above:  #Chronic systolic and diastolic heart failure: EF 30-35%. NYHA class II symptoms. Appears euvolemic on exam with normal impedences on CorVue. Taking all medications as prescribed and tolerating them well.  -Continue entresto 24/26mg --up-titrate on next visit if BP  room -Continue metop 50mg  XL -Resume spironolactone 25mg  daily--unclear why stopped and do not see a note in the system indicating it should be held -Continue jardiance -S/p CRT-D and followed by Dr. [name] with normal functioning -Monitor daily weights -Low Na diet  #Severe multivessel CAD s/p CABG in 2000 with subsequent PCIs: Last cath 12/2019. LIMA-LAD (patent) with distal diffuse LAD disease; SVG-RCA known occluded; SVG to diag is s/p PCI with patent stent but 60-70% stenosis after stent. SVG-OM with recent POBA for in-stent restenosis.  -NO FUTURE CATHS FROM LEFT RADIAL due to severe tortuosity of SCA -Continue ASA 81mg  and plavix 75mg  indefinitely -Continue protonix as patient reports GI upset with ASA; no bleeding issues -Will need to monitor for anginal symptoms due to known 60-70% stenosis of SVG-Diag  -Continue high dose statin  #Paroxysmal Afib: CHADs-vasc 8. Not on AC due to low Afib burden. -Follows with Dr. [name] -CRT-D in place as above -Continue metop 50mg  XL  #HTN: Well controlled. -Continue metop 50mg  XL -Continue entresto 24/26mg  BID--can up-titrate at next visit if BP room -Continue spironolactone 25mg  daily  #HLD: -Continue crestor 40mg  daily  #DMII: Poorly controlled. On insulin. -Continue insulin -Continue jardiance as above -Management per primary care     Medication Adjustments/Labs and Tests Ordered: Current medicines are reviewed at length with the patient today.  Concerns regarding medicines are outlined above.  No orders of the defined types were  placed in this encounter.  Meds ordered this encounter  Medications  . spironolactone (ALDACTONE) 25 MG tablet    Sig: Take 1 tablet (25 mg total) by mouth daily.    Dispense:  90 tablet    Refill:  3    Patient Instructions  Medication Instructions:  Your physician recommends that you continue on your current medications as directed. Please refer to the Current Medication list given to  you today.  *If you need a refill on your cardiac medications before your next appointment, please call your pharmacy*   Lab Work: None If you have labs (blood work) drawn today and your tests are completely normal, you will receive your results only by: [name] MyChart Message (if you have MyChart) OR . A paper copy in the mail If you have any lab test that is abnormal or we need to change your treatment, we will call you to review the results.   Testing/Procedures: None   Follow-Up: At [facility], you and your health needs are our priority.  As part of our continuing mission to provide you with exceptional heart care, we have created designated Provider Care Teams.  These Care Teams include your primary Cardiologist (physician) and Advanced Practice Providers (APPs -  Physician Assistants and Nurse Practitioners) who all work together to provide you with the care you need, when you need it.  We recommend signing up for the patient portal called "MyChart".  Sign up information is provided on this After Visit Summary.  MyChart is used to connect with patients for Virtual Visits (Telemedicine).  Patients are able to view lab/test results, encounter notes, upcoming appointments, etc.  Non-urgent messages can be sent to your provider as well.   To learn more about what you can do with MyChart, go to [email or web address].    Your next appointment:   3 month(s)  The format for your next appointment:   In Person  Provider:   You may see Freada Bergeron, MD or one of the following Advanced Practice Providers on your designated Care Team:    Richardson Dopp, PA-C  Robbie Lis, [place]    Other Instructions      Signed, Freada Bergeron, MD  09/25/2020 4:57 PM    [place]

## 2020-09-17 NOTE — Progress Notes (Signed)
EPIC Encounter for ICM Monitoring  Patient Name: Laurie [name] is a 75 y.o. female Date: 09/17/2020 Primary Care Physican: Gaspar Garbe, MD Primary Cardiologist:P[place]ton Electrophysiologist:Klein Bi-V Pacing:>99% 08/05/2020 Office Weight: 180lbs    Transmission Reviewed.  CorVueThoracic impedancenormal but was suggesting possible fluid accumulation from 12/5-12/15 and 12/19-12/22.  Prescribed:Spironolactone 25 mg take 1 tablet daily.    Labs: 07/16/2020 Creatinine 0.94, BUN 14, Potassium 4.1, Sodium 141, GFR 60-70 02/20/2020 Creatinine 0.88, BUN 24, Potassium 4.3, Sodium 138, GFR 65-75 01/30/2020 Creatinine 1.2, BUN 7, Potassium 3.8, Sodium 133, GFR 44-53.3 A complete set of results can be found in Results Review.  Recommendations:No changes.  Follow-up plan: ICM clinic phone appointment on2/04/2021. 91 day device clinic remote transmission3/10/2020.  EP/Cardiology Office Visits:09/25/2020 with Dr Shari Prows   Copy of ICM check sent to Dr.Klein.   3 month ICM trend: 09/15/2020.    1 Year ICM trend:       Karie Soda, RN 09/17/2020 2:52 PM

## 2020-09-25 ENCOUNTER — Other Ambulatory Visit: Payer: Self-pay

## 2020-09-25 ENCOUNTER — Ambulatory Visit: Payer: Medicare HMO | Admitting: Cardiology

## 2020-09-25 ENCOUNTER — Encounter: Payer: Self-pay | Admitting: Cardiology

## 2020-09-25 DIAGNOSIS — I2510 Atherosclerotic heart disease of native coronary artery without angina pectoris: Secondary | ICD-10-CM

## 2020-09-25 DIAGNOSIS — I110 Hypertensive heart disease with heart failure: Secondary | ICD-10-CM | POA: Diagnosis not present

## 2020-09-25 DIAGNOSIS — Z79899 Other long term (current) drug therapy: Secondary | ICD-10-CM | POA: Diagnosis not present

## 2020-09-25 DIAGNOSIS — I5022 Chronic systolic (congestive) heart failure: Secondary | ICD-10-CM

## 2020-09-25 MED ORDER — SPIRONOLACTONE 25 MG PO TABS
25.0000 mg | ORAL_TABLET | Freq: Every day | ORAL | 3 refills | Status: DC
Start: 2020-09-25 — End: 2021-03-31

## 2020-09-25 NOTE — Patient Instructions (Signed)
Medication Instructions:  Your physician recommends that you continue on your current medications as directed. Please refer to the Current Medication list given to you today.  *If you need a refill on your cardiac medications before your next appointment, please call your pharmacy*   Lab Work: None If you have labs (blood work) drawn today and your tests are completely normal, you will receive your results only by: [name] MyChart Message (if you have MyChart) OR . A paper copy in the mail If you have any lab test that is abnormal or we need to change your treatment, we will call you to review the results.   Testing/Procedures: None   Follow-Up: At [facility], you and your health needs are our priority.  As part of our continuing mission to provide you with exceptional heart care, we have created designated Provider Care Teams.  These Care Teams include your primary Cardiologist (physician) and Advanced Practice Providers (APPs -  Physician Assistants and Nurse Practitioners) who all work together to provide you with the care you need, when you need it.  We recommend signing up for the patient portal called "MyChart".  Sign up information is provided on this After Visit Summary.  MyChart is used to connect with patients for Virtual Visits (Telemedicine).  Patients are able to view lab/test results, encounter notes, upcoming appointments, etc.  Non-urgent messages can be sent to your provider as well.   To learn more about what you can do with MyChart, go to [email or web address].    Your next appointment:   3 month(s)  The format for your next appointment:   In Person  Provider:   You may see Freada Bergeron, MD or one of the following Advanced Practice Providers on your designated Care Team:    Richardson Dopp, PA-C  Robbie Lis, [place]    Other Instructions

## 2020-10-21 ENCOUNTER — Ambulatory Visit (INDEPENDENT_AMBULATORY_CARE_PROVIDER_SITE_OTHER): Payer: Medicare HMO

## 2020-10-21 DIAGNOSIS — I5022 Chronic systolic (congestive) heart failure: Secondary | ICD-10-CM | POA: Diagnosis not present

## 2020-10-21 DIAGNOSIS — Z95810 Presence of automatic (implantable) cardiac defibrillator: Secondary | ICD-10-CM

## 2020-10-28 NOTE — Progress Notes (Signed)
EPIC Encounter for ICM Monitoring  Patient Name: ADESUWA OSGOOD is a 75 y.o. female Date: 10/28/2020 Primary Care Physican: Haywood Pao, MD Primary Cardiologist:Pemberton Electrophysiologist:Klein Bi-V Pacing:>99% 08/05/2020 Office Weight: 180lbs    Transmission Reviewed.  CorVueThoracic impedancenormal.  Prescribed:Spironolactone 25 mg take 1 tablet daily.    Labs: 07/16/2020 Creatinine 0.94, BUN 14, Potassium 4.1, Sodium 141, GFR 60-70 02/20/2020 Creatinine 0.88, BUN 24, Potassium 4.3, Sodium 138, GFR 65-75 01/30/2020 Creatinine 1.2, BUN 7, Potassium 3.8, Sodium 133, GFR 44-53.3 A complete set of results can be found in Results Review.  Recommendations:No changes.  Follow-up plan: ICM clinic phone appointment on3/14/2022. 91 day device clinic remote transmission3/10/2020.  EP/Cardiology Office Visits:01/02/2021 withDr Johney Frame.  Recall 07/11/2021 with Dr [name]  Copy of ICM check sent to [place].   3 month ICM trend: 10/21/2020.    1 Year ICM trend:       [name], RN 10/28/2020 1:12 PM

## 2020-11-12 ENCOUNTER — Ambulatory Visit (INDEPENDENT_AMBULATORY_CARE_PROVIDER_SITE_OTHER): Payer: Medicare HMO

## 2020-11-12 DIAGNOSIS — I429 Cardiomyopathy, unspecified: Secondary | ICD-10-CM

## 2020-11-14 LAB — CUP PACEART REMOTE DEVICE CHECK
Battery Remaining Longevity: 29 mo
Battery Remaining Percentage: 34 %
Battery Voltage: 2.9 V
Brady Statistic AP VP Percent: 20 %
Brady Statistic AP VS Percent: 1 %
Brady Statistic AS VP Percent: 79 %
Brady Statistic AS VS Percent: 1 %
Brady Statistic RA Percent Paced: 20 %
Date Time Interrogation Session: 20220302202036
HighPow Impedance: 65 Ohm
HighPow Impedance: 65 Ohm
Implantable Lead Implant Date: 20140210
Implantable Lead Implant Date: 20170222
Implantable Lead Implant Date: 20170222
Implantable Lead Location: 753858
Implantable Lead Location: 753859
Implantable Lead Location: 753860
Implantable Lead Model: 4298
Implantable Lead Model: 5076
Implantable Lead Model: 7122
Implantable Pulse Generator Implant Date: 20170222
Lead Channel Impedance Value: 350 Ohm
Lead Channel Impedance Value: 400 Ohm
Lead Channel Impedance Value: 460 Ohm
Lead Channel Pacing Threshold Amplitude: 0.625 V
Lead Channel Pacing Threshold Amplitude: 0.75 V
Lead Channel Pacing Threshold Amplitude: 0.875 V
Lead Channel Pacing Threshold Pulse Width: 0.5 ms
Lead Channel Pacing Threshold Pulse Width: 0.5 ms
Lead Channel Pacing Threshold Pulse Width: 0.5 ms
Lead Channel Sensing Intrinsic Amplitude: 12 mV
Lead Channel Sensing Intrinsic Amplitude: 3.3 mV
Lead Channel Setting Pacing Amplitude: 2 V
Lead Channel Setting Pacing Amplitude: 2 V
Lead Channel Setting Pacing Amplitude: 2 V
Lead Channel Setting Pacing Pulse Width: 0.5 ms
Lead Channel Setting Pacing Pulse Width: 0.5 ms
Lead Channel Setting Sensing Sensitivity: 0.5 mV
Pulse Gen Serial Number: 7341405

## 2020-11-20 NOTE — Progress Notes (Signed)
Remote ICD transmission.   

## 2020-11-24 ENCOUNTER — Ambulatory Visit (INDEPENDENT_AMBULATORY_CARE_PROVIDER_SITE_OTHER): Payer: Medicare HMO

## 2020-11-24 DIAGNOSIS — I5022 Chronic systolic (congestive) heart failure: Secondary | ICD-10-CM

## 2020-11-24 DIAGNOSIS — Z95810 Presence of automatic (implantable) cardiac defibrillator: Secondary | ICD-10-CM

## 2020-11-25 NOTE — Progress Notes (Signed)
EPIC Encounter for ICM Monitoring  Patient Name: Laurie [name] is a 75 y.o. female Date: 11/25/2020 Primary Care Physican: Haywood Pao, MD Primary Cardiologist:Pemberton Electrophysiologist:Klein Bi-V Pacing:>99% 09/25/2020 OfficeWeight: 176lbs    Transmission Reviewed.  CorVueThoracic impedancenormal.  Prescribed:Spironolactone 25 mg take 1 tablet daily.  Labs: 07/16/2020 Creatinine 0.94, BUN 14, Potassium 4.1, Sodium 141, GFR 60-70 02/20/2020 Creatinine 0.88, BUN 24, Potassium 4.3, Sodium 138, GFR 65-75 01/30/2020 Creatinine 1.2, BUN 7, Potassium 3.8, Sodium 133, GFR 44-53.3 A complete set of results can be found in Results Review.  Recommendations:No changes.  Follow-up plan: ICM clinic phone appointment on4/18/2022. 91 day device clinic remote transmission6/09/2020.  EP/Cardiology Office Visits:4/22/2022withDr Pemberton.  Recall 07/11/2021 with Dr [name]  Copy of ICM check sent to [place].  3 month ICM trend: 11/24/2020.    1 Year ICM trend:       [name], RN 11/25/2020 10:06 AM

## 2020-11-26 DIAGNOSIS — E1165 Type 2 diabetes mellitus with hyperglycemia: Secondary | ICD-10-CM | POA: Diagnosis not present

## 2020-11-26 DIAGNOSIS — F0390 Unspecified dementia without behavioral disturbance: Secondary | ICD-10-CM | POA: Diagnosis not present

## 2020-11-26 DIAGNOSIS — E1151 Type 2 diabetes mellitus with diabetic peripheral angiopathy without gangrene: Secondary | ICD-10-CM | POA: Diagnosis not present

## 2020-11-26 DIAGNOSIS — Z794 Long term (current) use of insulin: Secondary | ICD-10-CM | POA: Diagnosis not present

## 2020-11-26 DIAGNOSIS — E261 Secondary hyperaldosteronism: Secondary | ICD-10-CM | POA: Diagnosis not present

## 2020-11-26 DIAGNOSIS — G4733 Obstructive sleep apnea (adult) (pediatric): Secondary | ICD-10-CM | POA: Diagnosis not present

## 2020-11-26 DIAGNOSIS — E1140 Type 2 diabetes mellitus with diabetic neuropathy, unspecified: Secondary | ICD-10-CM | POA: Diagnosis not present

## 2020-11-26 DIAGNOSIS — E7800 Pure hypercholesterolemia, unspecified: Secondary | ICD-10-CM | POA: Diagnosis not present

## 2020-11-26 DIAGNOSIS — I679 Cerebrovascular disease, unspecified: Secondary | ICD-10-CM | POA: Diagnosis not present

## 2020-12-01 ENCOUNTER — Other Ambulatory Visit: Payer: Self-pay | Admitting: *Deleted

## 2020-12-01 MED ORDER — MEMANTINE HCL 10 MG PO TABS: 10.0000 mg | ORAL_TABLET | Freq: Two times a day (BID) | ORAL | 3 refills | Status: AC

## 2020-12-11 ENCOUNTER — Telehealth: Payer: Self-pay | Admitting: Neurology

## 2020-12-11 NOTE — Telephone Encounter (Signed)
Appt on 4/21 needs to be rescheduled due to Judson Roch out of office. VM was full. Sent message through MyChart to call us back to r/s.

## 2020-12-19 ENCOUNTER — Other Ambulatory Visit: Payer: Self-pay

## 2020-12-19 DIAGNOSIS — I110 Hypertensive heart disease with heart failure: Secondary | ICD-10-CM

## 2020-12-19 DIAGNOSIS — Z79899 Other long term (current) drug therapy: Secondary | ICD-10-CM

## 2020-12-19 DIAGNOSIS — I2510 Atherosclerotic heart disease of native coronary artery without angina pectoris: Secondary | ICD-10-CM

## 2020-12-19 DIAGNOSIS — I5022 Chronic systolic (congestive) heart failure: Secondary | ICD-10-CM

## 2020-12-19 MED ORDER — EMPAGLIFLOZIN 10 MG PO TABS: 10.0000 mg | ORAL_TABLET | Freq: Every day | ORAL | 1 refills | Status: DC

## 2020-12-26 ENCOUNTER — Encounter: Payer: Self-pay | Admitting: Cardiology

## 2020-12-29 ENCOUNTER — Ambulatory Visit (INDEPENDENT_AMBULATORY_CARE_PROVIDER_SITE_OTHER): Payer: Medicare HMO

## 2020-12-29 DIAGNOSIS — I5022 Chronic systolic (congestive) heart failure: Secondary | ICD-10-CM

## 2020-12-29 DIAGNOSIS — Z95810 Presence of automatic (implantable) cardiac defibrillator: Secondary | ICD-10-CM

## 2021-01-01 ENCOUNTER — Ambulatory Visit: Payer: Medicare HMO | Admitting: Neurology

## 2021-01-02 ENCOUNTER — Ambulatory Visit: Payer: Medicare HMO | Admitting: Cardiology

## 2021-01-02 NOTE — Progress Notes (Signed)
EPIC Encounter for ICM Monitoring  Patient Name: Laurie [name] is a 75 y.o. female Date: 01/02/2021 Primary Care Physican: Haywood Pao, MD Primary Cardiologist:Pemberton Electrophysiologist:Klein Bi-V Pacing:>99% 09/25/2020 OfficeWeight: 176lbs    Transmission Reviewed.  CorVueThoracic impedancenormal.  Prescribed:Spironolactone 25 mg take 1 tablet daily.  Labs: 07/16/2020 Creatinine 0.94, BUN 14, Potassium 4.1, Sodium 141, GFR 60-70 02/20/2020 Creatinine 0.88, BUN 24, Potassium 4.3, Sodium 138, GFR 65-75 01/30/2020 Creatinine 1.2, BUN 7, Potassium 3.8, Sodium 133, GFR 44-53.3 A complete set of results can be found in Results Review.  Recommendations:No changes.  Follow-up plan: ICM clinic phone appointment on6/13/2022. 91 day device clinic remote transmission6/09/2020.  EP/Cardiology Office Visits: 7/19/2022withDr Johney Frame. Recall 07/11/2021 with Dr [name]  Copy of ICM check sent to [place].  3 month ICM trend: 12/29/2020.    1 Year ICM trend:       Laurie Billings, RN 01/02/2021 10:59 AM

## 2021-02-11 ENCOUNTER — Ambulatory Visit (INDEPENDENT_AMBULATORY_CARE_PROVIDER_SITE_OTHER): Payer: Medicare HMO

## 2021-02-11 DIAGNOSIS — I429 Cardiomyopathy, unspecified: Secondary | ICD-10-CM

## 2021-02-11 LAB — CUP PACEART REMOTE DEVICE CHECK
Battery Remaining Longevity: 28 mo
Battery Remaining Percentage: 32 %
Battery Voltage: 2.9 V
Brady Statistic AP VP Percent: 16 %
Brady Statistic AP VS Percent: 1 %
Brady Statistic AS VP Percent: 84 %
Brady Statistic AS VS Percent: 1 %
Brady Statistic RA Percent Paced: 15 %
Date Time Interrogation Session: 20220601055611
HighPow Impedance: 63 Ohm
HighPow Impedance: 63 Ohm
Implantable Lead Implant Date: 20140210
Implantable Lead Implant Date: 20170222
Implantable Lead Implant Date: 20170222
Implantable Lead Location: 753858
Implantable Lead Location: 753859
Implantable Lead Location: 753860
Implantable Lead Model: 4298
Implantable Lead Model: 5076
Implantable Lead Model: 7122
Implantable Pulse Generator Implant Date: 20170222
Lead Channel Impedance Value: 360 Ohm
Lead Channel Impedance Value: 400 Ohm
Lead Channel Impedance Value: 460 Ohm
Lead Channel Pacing Threshold Amplitude: 0.625 V
Lead Channel Pacing Threshold Amplitude: 0.75 V
Lead Channel Pacing Threshold Amplitude: 0.75 V
Lead Channel Pacing Threshold Pulse Width: 0.5 ms
Lead Channel Pacing Threshold Pulse Width: 0.5 ms
Lead Channel Pacing Threshold Pulse Width: 0.5 ms
Lead Channel Sensing Intrinsic Amplitude: 12 mV
Lead Channel Sensing Intrinsic Amplitude: 2.8 mV
Lead Channel Setting Pacing Amplitude: 2 V
Lead Channel Setting Pacing Amplitude: 2 V
Lead Channel Setting Pacing Amplitude: 2 V
Lead Channel Setting Pacing Pulse Width: 0.5 ms
Lead Channel Setting Pacing Pulse Width: 0.5 ms
Lead Channel Setting Sensing Sensitivity: 0.5 mV
Pulse Gen Serial Number: 7341405

## 2021-02-23 ENCOUNTER — Ambulatory Visit (INDEPENDENT_AMBULATORY_CARE_PROVIDER_SITE_OTHER): Payer: Medicare HMO

## 2021-02-23 DIAGNOSIS — Z95810 Presence of automatic (implantable) cardiac defibrillator: Secondary | ICD-10-CM | POA: Diagnosis not present

## 2021-02-23 DIAGNOSIS — I5022 Chronic systolic (congestive) heart failure: Secondary | ICD-10-CM

## 2021-02-24 ENCOUNTER — Other Ambulatory Visit: Payer: Self-pay | Admitting: *Deleted

## 2021-02-24 DIAGNOSIS — I5022 Chronic systolic (congestive) heart failure: Secondary | ICD-10-CM

## 2021-02-24 DIAGNOSIS — I2510 Atherosclerotic heart disease of native coronary artery without angina pectoris: Secondary | ICD-10-CM

## 2021-02-24 DIAGNOSIS — Z79899 Other long term (current) drug therapy: Secondary | ICD-10-CM

## 2021-02-24 DIAGNOSIS — I110 Hypertensive heart disease with heart failure: Secondary | ICD-10-CM

## 2021-02-24 MED ORDER — EMPAGLIFLOZIN 10 MG PO TABS: 10.0000 mg | ORAL_TABLET | Freq: Every day | ORAL | 0 refills | Status: DC

## 2021-02-25 NOTE — Progress Notes (Signed)
EPIC Encounter for ICM Monitoring  Patient Name: Laurie [name] is a 76 y.o. female Date: 02/25/2021 Primary Care Physican: Haywood Pao, MD Primary Cardiologist: Johney Frame Electrophysiologist: Vergie Living Pacing:  > 99%       09/25/2020 Office Weight: 176 lbs         Transmission Reviewed.   CorVue Thoracic impedance normal but was suggesting possible fluid accumulation from  5/29-6/12.   Prescribed: Spironolactone 25 mg take 1 tablet daily.     Labs:   07/16/2020 Creatinine 0.94, BUN 14, Potassium 4.1, Sodium 141, GFR 60-70 02/20/2020 Creatinine 0.88, BUN 24, Potassium 4.3, Sodium 138, GFR 65-75 01/30/2020 Creatinine 1.2,   BUN 7,   Potassium 3.8, Sodium 133, GFR 44-53.3 A complete set of results can be found in Results Review.   Recommendations:  No changes.   Follow-up plan: ICM clinic phone appointment on 03/30/2021.   91 day device clinic remote transmission 05/13/2021.   EP/Cardiology Office Visits: 03/31/2021 with Dr Johney Frame.  Recall 07/11/2021 with Dr [name]   Copy of ICM check sent to Dr. [name].   3 month ICM trend: 02/23/2021.    1 Year ICM trend:       [name], RN 02/25/2021 11:32 AM

## 2021-02-26 DIAGNOSIS — I5022 Chronic systolic (congestive) heart failure: Secondary | ICD-10-CM | POA: Diagnosis not present

## 2021-02-26 DIAGNOSIS — I209 Angina pectoris, unspecified: Secondary | ICD-10-CM | POA: Diagnosis not present

## 2021-02-26 DIAGNOSIS — E1140 Type 2 diabetes mellitus with diabetic neuropathy, unspecified: Secondary | ICD-10-CM | POA: Diagnosis not present

## 2021-02-26 DIAGNOSIS — I130 Hypertensive heart and chronic kidney disease with heart failure and stage 1 through stage 4 chronic kidney disease, or unspecified chronic kidney disease: Secondary | ICD-10-CM | POA: Diagnosis not present

## 2021-02-26 DIAGNOSIS — E1165 Type 2 diabetes mellitus with hyperglycemia: Secondary | ICD-10-CM | POA: Diagnosis not present

## 2021-02-26 DIAGNOSIS — Z794 Long term (current) use of insulin: Secondary | ICD-10-CM | POA: Diagnosis not present

## 2021-02-26 DIAGNOSIS — E1151 Type 2 diabetes mellitus with diabetic peripheral angiopathy without gangrene: Secondary | ICD-10-CM | POA: Diagnosis not present

## 2021-02-26 DIAGNOSIS — I679 Cerebrovascular disease, unspecified: Secondary | ICD-10-CM | POA: Diagnosis not present

## 2021-02-26 DIAGNOSIS — I2510 Atherosclerotic heart disease of native coronary artery without angina pectoris: Secondary | ICD-10-CM | POA: Diagnosis not present

## 2021-03-06 NOTE — Progress Notes (Signed)
Remote ICD transmission.   

## 2021-03-25 ENCOUNTER — Other Ambulatory Visit: Payer: Self-pay | Admitting: Neurology

## 2021-03-29 NOTE — Progress Notes (Signed)
Cardiology Office Note:    Date:  03/31/2021   ID:  Laurie [name], DOB 03/25/1946, MRN [id number]  PCP:  Haywood Pao, MD  [facility] Cardiologist:  Freada Bergeron, MD  [facility] Electrophysiologist:  None   Referring MD: Haywood Pao, MD    History of Present Illness:    Laurie [name] is a 75 y.o. female with a hx of with a history of CAD (CABG 2000 >> 12/2019 POBA to SVG OM; PCI to SVG), Bells Palsy, HTN, HLD, chronic systolic heart failure, AFib, CVA (left with chronic balance problems), OSA (uses CPAP), DM, CHB s/p SJM CRT-D 11/05/2015 (RV/LV leads 2017), RA lead 2014 who presents to clinic for follow-up.  During visit on 08/05/20, she was doing well from a CV perspective. We added spironolactone and jardiance for GDMT for underlying heart failure.  Last seen in clinic 09/25/20 where she was doing well. No HF symptoms. Resumed her spironolactone at that time.  Today, the patient feels overall okay. No chest pain, SOB, orthopnea or PND. Occasional LE edema if she eats salt. Has chronic fatigue, which is not worsening. Compliant with medications. Insulin was recently increased and started on trulicity. She has not yet resumed her spironolactone but no kidney dysfunction and K is okay.   Past Medical History:  Diagnosis Date   AICD (automatic cardioverter/defibrillator) present    Bell's palsy 01/05/2010   CAD (coronary artery disease)    Cath  07/21/1999  mild ostial, 80% stenosis proximal LAD, 90% stenosis proximal Diag 1, 95% stenosis proximal OM 1, 80% stenosis proximal RCA, 95% stenosis mid RCA    CABG 07/22/99  LIMA to LAD, SVG to dx, SVG to OM, SVG to PDA  Dr. [name]   Cardiolite 2012 no ischemia EF 67%  Cath 10/23/12 Severe native three-vessel coronary artery disease with occlusion of all 3 native coronary arteries, Patent sap   Chronic lower back pain    Chronic systolic CHF (congestive heart failure) ([place]) 10/22/2015   Cystitis    Gait abnormality     Hepatitis 1970s   "don't know which" (03/17/2017)   HIstory of Bell's palsy    October 2012    Hyperlipidemia    Intolerance to several statins    Hypertensive heart disease without CHF    Hypoxia 01/09/2020   Memory loss    Multinodular goiter    Obesity (BMI 30-39.9)    OSA on CPAP    "suppose to wear a mask" (03/17/2017)   Peripheral neuropathy    Pneumonia 1990s X 1   "think i had walking pneumonia"   Seasonal allergies    Stroke ([place]) 03/2017   "light one"; denies residual on 03/17/2017)   Type II diabetes mellitus ([place])     Past Surgical History:  Procedure Laterality Date   BI-VENTRICULAR PACEMAKER UPGRADE  11/05/2015   "upgraded my pacemaker"   BIOPSY THYROID  05/2010   percutaneous   CARDIAC CATHETERIZATION N/A 10/21/2015   Procedure: Left Heart Cath and Cors/Grafts Angiography;  Surgeon: Belva Crome, MD;  Location: [place] [facility];  Service: Cardiovascular;  Laterality: N/A;   CARPAL TUNNEL RELEASE Bilateral    CATARACT EXTRACTION Right 01/06/2015   CATARACT EXTRACTION W/ INTRAOCULAR LENS IMPLANT Left 12/16/2014   CORONARY ARTERY BYPASS GRAFT  2000   CORONARY BALLOON ANGIOPLASTY N/A 01/09/2020   Procedure: CORONARY BALLOON ANGIOPLASTY;  Surgeon: Wellington Hampshire, MD;  Location: [place] [facility];  Service: Cardiovascular;  Laterality: N/A;  CORONARY STENT INTERVENTION N/A 10/28/2017   Procedure: CORONARY STENT INTERVENTION;  Surgeon: Jettie Booze, MD;  Location: [place] [facility];  Service: Cardiovascular;  Laterality: N/A;   CYSTECTOMY Right    "middle finger"   DENTAL IMPLANTS     EP IMPLANTABLE DEVICE N/A 11/05/2015   Procedure: BiV Upgrade;  Surgeon: Deboraha Sprang, MD;  Location: [place] [facility];  Service: Cardiovascular;  Laterality: N/A;   INSERT / REPLACE / REMOVE PACEMAKER     LEAD REVISION N/A 10/25/2012   Procedure: LEAD REVISION;  Surgeon: Evans Lance, MD;  Location: [facility] CATH LAB;  Service: Cardiovascular;  Laterality: N/A;   LEFT HEART CATH  AND CORS/GRAFTS ANGIOGRAPHY N/A 10/28/2017   Procedure: LEFT HEART CATH AND CORS/GRAFTS ANGIOGRAPHY;  Surgeon: Jettie Booze, MD;  Location: [place] [facility];  Service: Cardiovascular;  Laterality: N/A;   LEFT HEART CATH AND CORS/GRAFTS ANGIOGRAPHY N/A 01/09/2020   Procedure: LEFT HEART CATH AND CORS/GRAFTS ANGIOGRAPHY;  Surgeon: Wellington Hampshire, MD;  Location: [place] [facility];  Service: Cardiovascular;  Laterality: N/A;   LEFT HEART CATHETERIZATION WITH CORONARY ANGIOGRAM N/A 10/23/2012   Procedure: LEFT HEART CATHETERIZATION WITH CORONARY ANGIOGRAM;  Surgeon: Jacolyn Reedy, MD;  Location: [facility] CATH LAB;  Service: Cardiovascular;  Laterality: N/A;   PERMANENT PACEMAKER INSERTION N/A 10/23/2012   Procedure: PERMANENT PACEMAKER INSERTION;  Surgeon: Deboraha Sprang, MD;  Location: [facility] CATH LAB;  Service: Cardiovascular;  Laterality: N/A;   PLANTAR FASCIA RELEASE Left    "& clipped a tendon that went thru bottom of my foot"   TEMPORARY PACEMAKER INSERTION N/A 10/22/2012   Procedure: TEMPORARY PACEMAKER INSERTION;  Surgeon: Troy Sine, MD;  Location: [facility] CATH LAB;  Service: Cardiovascular;  Laterality: N/A;   TUBAL LIGATION      Current Medications: Current Meds  Medication Sig   ACCU-CHEK FASTCLIX LANCETS MISC USE  TO CHECK BLOOD SUGAR FOUR TIMES DAILY   ACCU-CHEK SMARTVIEW test strip USE  TO CHECK BLOOD SUGAR FOUR TIMES DAILY   Alcohol Swabs (B-D SINGLE USE SWABS REGULAR) PADS Use 8 daily for testing blood sugar and insulin injections.   aspirin EC 81 MG tablet Take 81 mg by mouth daily. Swallow whole.   BD VEO INSULIN SYRINGE U/F 31G X 15/64" 1 ML MISC USE 10 TIMES A DAY WITH INSULIN AS DIRECTED   beta carotene w/minerals (OCUVITE) tablet Take 1 tablet by mouth daily.   Blood Glucose Calibration (ACCU-CHEK SMARTVIEW CONTROL) LIQD Use to calibrate blood glucose meter.   cetirizine (ZYRTEC) 10 MG tablet Take 1 tablet (10 mg total) by mouth daily.   cholecalciferol (VITAMIN D) 1000  UNITS tablet Take 1,000 Units by mouth daily.    clopidogrel (PLAVIX) 75 MG tablet Take 1 tablet (75 mg total) by mouth daily.   Continuous Blood Gluc Sensor (FREESTYLE LIBRE 14 DAY SENSOR) MISC APPLY EVERY 14 DAYS   Dulaglutide (TRULICITY) 8.85 OY/7.7AJ SOPN Inject into the skin once a week.   empagliflozin (JARDIANCE) 10 MG TABS tablet Take 1 tablet (10 mg total) by mouth daily before breakfast. Please keep upcoming appointment in July 2022 for future refills. Thank you   insulin glargine (LANTUS) 100 UNIT/ML injection Inject 60 Units into the skin 2 (two) times daily.   Insulin Pen Needle (BD PEN NEEDLE NANO U/F) 32G X 4 MM MISC 1 each by Does not apply route 3 (three) times daily. Use to inject insulin TID; E11.40   ketoconazole (NIZORAL) 2 % cream  Apply 1 application topically daily as needed for irritation. TO AFFECTED AREAS OF FACE   memantine (NAMENDA) 10 MG tablet Take 1 tablet (10 mg total) by mouth 2 (two) times daily.   nitroGLYCERIN (NITROSTAT) 0.4 MG SL tablet Place 0.4 mg under the tongue every 5 (five) minutes as needed for chest pain.    pantoprazole (PROTONIX) 40 MG tablet Take 1 tablet (40 mg total) by mouth daily.   psyllium (METAMUCIL) 58.6 % powder Take 1 packet by mouth daily as needed (fiber).    rosuvastatin (CRESTOR) 40 MG tablet Take 40 mg by mouth daily.   sacubitril-valsartan (ENTRESTO) 49-51 MG Take 1 tablet by mouth 2 (two) times daily.   vitamin C (ASCORBIC ACID) 500 MG tablet Take 500 mg by mouth every other day.   [DISCONTINUED] sacubitril-valsartan (ENTRESTO) 24-26 MG Take 1 tablet by mouth 2 (two) times daily.     Allergies:   Brilinta [ticagrelor], Statins, Tricor [fenofibrate], Latex, and Tape   Social History   Socioeconomic History   Marital status: Married    Spouse name: Not on file   Number of children: 2   Years of education: 12   Highest education level: High school graduate  Occupational History    Employer: RETIRED  Tobacco Use   Smoking  status: Never   Smokeless tobacco: Never  Vaping Use   Vaping Use: Never used  Substance and Sexual Activity   Alcohol use: No    Alcohol/week: 0.0 standard drinks   Drug use: No   Sexual activity: Not Currently  Other Topics Concern   Not on file  Social History Narrative   Lives at home with her husband.   Right-handed.   One cup caffeine per day.   Social Determinants of Health   Financial Resource Strain: Not on file  Food Insecurity: Not on file  Transportation Needs: Not on file  Physical Activity: Not on file  Stress: Not on file  Social Connections: Not on file     Family History: The patient's family history includes Diabetes in her father, mother, and another family member; Heart disease in her father and another family member. There is no history of Colon cancer or Colon polyps.  ROS:   Please see the history of present illness.    Review of Systems  Constitutional:  Positive for malaise/fatigue. Negative for chills, diaphoresis and fever.  HENT:  Negative for nosebleeds.   Eyes:  Negative for blurred vision.  Respiratory:  Negative for shortness of breath.   Cardiovascular:  Positive for leg swelling. Negative for chest pain, palpitations, orthopnea, claudication and PND.  Gastrointestinal:  Negative for nausea and vomiting.  Genitourinary:  Negative for hematuria.  Musculoskeletal:  Negative for falls.  Neurological:  Negative for dizziness and loss of consciousness.  Psychiatric/Behavioral:  Negative for substance abuse.    EKGs/Labs/Other Studies Reviewed:    The following studies were reviewed today: LHC 01/28/20: Mid RCA lesion is 100% stenosed. Ost RCA to Prox RCA lesion is 50% stenosed. Previously placed Prox Graft stent (unknown type) is widely patent. Mid Graft lesion is 65% stenosed. Origin lesion is 100% stenosed. LIMA and is normal in caliber. The graft exhibits no disease. Mid LAD to Dist LAD lesion is 80% stenosed. Origin to Prox Graft  lesion is 95% stenosed. Post intervention, there is a 10% residual stenosis. Balloon angioplasty was performed using a BALLOON [place] EMERGE MR 3.0X20. Dist LM lesion is 80% stenosed. Ost Cx to Prox Cx lesion is 100% stenosed.  Ost LAD to Prox LAD lesion is 100% stenosed.   1.  Severe underlying three-vessel coronary artery disease with occluded native vessels. 2.  Known chronically occluded SVG to right PDA.  LIMA to LAD is patent.  However, there is chronic diffuse disease in the mid and distal LAD.  SVG to diagonal is patent with no significant in-stent restenosis.  However, there is a borderline 60 to 70% lesion after the stent.  SVG to OM is patent with severe 95% in-stent restenosis proximally which is the likely culprit for myocardial infarction. 3.  Left ventricular angiography was not performed.  EF was 30 to 35% by echo.  Mildly elevated left ventricular end-diastolic pressure at 16 mmHg. 4.  Successful balloon angioplasty of SVG to OM for severe in-stent restenosis.   Recommendations: This was a very difficult and prolonged procedure from the left radial artery given inability to engage the SVG to OM.  There is significant tortuosity in the left subclavian artery.  I had to switch to a femoral approach. Avoid left radial catheterization in the future. Continue dual antiplatelet therapy indefinitely if possible. Aggressive treatment of risk factors. The lesion in the SVG to diagonal can likely be monitored for now.  PCI can be considered if she has residual angina.   TTE 01/09/20 IMPRESSIONS   1. Left ventricular ejection fraction, by estimation, is 30 to 35%. The  left ventricle has moderately decreased function. The left ventricle has  no regional wall motion abnormalities. The left ventricular internal  cavity size was mildly dilated. Left  ventricular diastolic parameters are consistent with Grade II diastolic  dysfunction (pseudonormalization). Elevated left ventricular  end-diastolic  pressure. There is akinesis of the left ventricular, entire inferior wall,  inferolateral wall and apical  segment.   2. Right ventricular systolic function is normal. The right ventricular  size is normal. There is normal pulmonary artery systolic pressure.   3. Left atrial size was mildly dilated.   4. The mitral valve is normal in structure. Mild mitral valve  regurgitation. No evidence of mitral stenosis.   5. The aortic valve is tricuspid. Aortic valve regurgitation is not  visualized. Mild aortic valve sclerosis is present, with no evidence of  aortic valve stenosis.   6. The inferior vena cava is normal in size with greater than 50%  respiratory variability, suggesting right atrial pressure of 3 mmHg.    LHC 10/28/17: Severe three vessel CAD. Prox LAD to Mid LAD lesion is 100% stenosed. LIMA to LAD is patent. 1st Diag-1 lesion is 100% stenosed. SVG to diagonal is patent with 80% stenosis. A drug-eluting stent was successfully placed using a STENT RESOLUTE ONYX 2.5X12 in the SVG to diagonal. Post intervention, there is a 0% residual stenosis. Dist LAD lesion is 75% stenosed past LIMA insertion. 2nd Mrg lesion is 100% stenosed. Ost 1st Mrg to 1st Mrg lesion is 100% stenosed. SVG to OM is occluded. Post intervention, there is a 0% residual stenosis. A drug-eluting stent was successfully placed using a STENT SYNERGY DES 3X20. This appears to be a fresh occlusion and was successfully treated. Mid RCA to Dist RCA lesion is 100% stenosed. SVG to PDA is occluded, LIMA and is normal in caliber. Ost Cx to Dist Cx lesion is 70% stenosed. The left ventricular ejection fraction is 45-50% by visual estimate. There is mild left ventricular systolic dysfunction. LV end diastolic pressure is moderately elevated. There is no aortic valve stenosis.   Successful two vessel PCI to the SVG  to diagonal and SVG to OM, with DES.  Continue dual antiplatelet therapy for at least one year  and possibly clopidogrel indefinitely going forward.  Finish current bag of angiomax.    Recent Labs: 07/16/2020: BUN 14; Creatinine, Ser 0.94; Potassium 4.1; Sodium 141  Recent Lipid Panel    Component Value Date/Time   CHOL 168 01/11/2020 0332   TRIG 151 (H) 01/11/2020 0332   HDL 38 (L) 01/11/2020 0332   CHOLHDL 4.4 01/11/2020 0332   VLDL 30 01/11/2020 0332   LDLCALC 100 (H) 01/11/2020 0332   LDLDIRECT 155.1 06/05/2012 1340     Physical Exam:    VS:  BP (!) 142/82   Pulse 94   Ht 5\' 2"  (1.575 m)   Wt 186 lb (84.4 kg)   SpO2 99%   BMI 34.02 kg/m     Wt Readings from Last 3 Encounters:  03/31/21 186 lb (84.4 kg)  09/25/20 176 lb 3.2 oz (79.9 kg)  08/05/20 180 lb (81.6 kg)     GEN:  Well nourished, well developed in no acute distress HEENT: Normal NECK: No JVD; No carotid bruits CARDIAC: RRR, no murmurs, rubs, gallops RESPIRATORY:  Clear to auscultation without rales, wheezing or rhonchi  ABDOMEN: Soft, non-tender, non-distended MUSCULOSKELETAL:  No edema; No deformity  SKIN: Warm and dry NEUROLOGIC:  Alert and oriented x 3 PSYCHIATRIC:  Normal affect   ASSESSMENT:    1. Chronic systolic congestive heart failure ([place])   2. Coronary artery disease involving native coronary artery of native heart without angina pectoris   3. Hypertensive heart disease with chronic systolic congestive heart failure ([place])   4. Medication management   5. Biventricular implantable cardioverter-defibrillator in situ   6. Complete heart block ([place])   7. Primary hypertension   8. Hyperlipidemia, unspecified hyperlipidemia type   9. Type 2 diabetes mellitus with complication, with long-term current use of insulin ([place])   10. Paroxysmal atrial fibrillation ([facility])     PLAN:    In order of problems listed above:  #Chronic systolic and diastolic heart failure: EF 30-35%. NYHA class II symptoms. Appears euvolemic on exam with normal impedences on CorVue. Taking all medications as  prescribed and tolerating them well.  -Continue metop 50mg  XL -Increase entresto to 49/51mg  BID -Resume spironolactone 12.5mg  daily -Check BMET next week -Continue jardiance 10mg  daily -S/p CRT-D and followed by Dr. [name] with normal function -Monitor daily weights -Low Na diet   #Severe multivessel CAD s/p CABG in 2000 with subsequent PCIs: Last cath 12/2019. LIMA-LAD (patent) with distal diffuse LAD disease; SVG-RCA known occluded; SVG to diag is s/p PCI with patent stent but 60-70% stenosis after stent. SVG-OM with recent POBA for in-stent restenosis.  -NO FUTURE CATHS FROM LEFT RADIAL due to severe tortuosity of SCA -Continue ASA 81mg  and plavix 75mg  indefinitely -Continue protonix as patient reports GI upset with ASA; no bleeding issues -Will need to monitor for anginal symptoms due to known 60-70% stenosis of SVG-Diag  -Continue crestor 40mg  daily   #Paroxysmal Afib: CHADs-vasc 8. Not on AC due to low Afib burden. -Follows with Dr. [name] -CRT-D in place as above -Continue metop 50mg  XL   #HTN: Elevated today at 140/80s.  -Continue metop 50mg  XL -Increase entresto to 49/51mg  BID -Resume spironolactone 12.5mg  daily -BMET next week   #HLD: -Continue crestor 40mg  daily -Check lipids at next visit   #DMII: Poorly controlled. On insulin. -Continue insulin; recently adjusted  -Continue jardiance 10mg  daily as above -Started on trulicity 0.75mg   1x/week -Management per primary care   Medication Adjustments/Labs and Tests Ordered: Current medicines are reviewed at length with the patient today.  Concerns regarding medicines are outlined above.  Orders Placed This Encounter  Procedures   Basic metabolic panel    Meds ordered this encounter  Medications   spironolactone (ALDACTONE) 25 MG tablet    Sig: Take 0.5 tablets (12.5 mg total) by mouth daily.    Dispense:  45 tablet    Refill:  3   sacubitril-valsartan (ENTRESTO) 49-51 MG    Sig: Take 1 tablet by mouth 2  (two) times daily.    Dispense:  180 tablet    Refill:  1    Dose increase     Patient Instructions  Medication Instructions:   START TAKING SPIRONOLACTONE 12.5 MG BY MOUTH DAILY  INCREASE YOUR ENTRESTO TO 49/51 MG DOSE-TAKE BY MOUTH TWICE DAILY  *If you need a refill on your cardiac medications before your next appointment, please call your pharmacy*   Lab Work:  [place] OFFICE--CHECK BMET  If you have labs (blood work) drawn today and your tests are completely normal, you will receive your results only by: [place] (if you have MyChart) OR A paper copy in the mail If you have any lab test that is abnormal or we need to change your treatment, we will call you to review the results.   Follow-Up: At [facility], you and your health needs are our priority.  As part of our continuing mission to provide you with exceptional heart care, we have created designated Provider Care Teams.  These Care Teams include your primary Cardiologist (physician) and Advanced Practice Providers (APPs -  Physician Assistants and Nurse Practitioners) who all work together to provide you with the care you need, when you need it.  We recommend signing up for the patient portal called "MyChart".  Sign up information is provided on this After Visit Summary.  MyChart is used to connect with patients for Virtual Visits (Telemedicine).  Patients are able to view lab/test results, encounter notes, upcoming appointments, etc.  Non-urgent messages can be sent to your provider as well.   To learn more about what you can do with MyChart, go to [email or web address].    Your next appointment:   6 month(s)  The format for your next appointment:   In Person  Provider:   Gwyndolyn Kaufman, MD       Signed, Freada Bergeron, MD  03/31/2021 4:57 PM    [place]

## 2021-03-30 ENCOUNTER — Ambulatory Visit (INDEPENDENT_AMBULATORY_CARE_PROVIDER_SITE_OTHER): Payer: Medicare HMO

## 2021-03-30 DIAGNOSIS — Z95810 Presence of automatic (implantable) cardiac defibrillator: Secondary | ICD-10-CM | POA: Diagnosis not present

## 2021-03-30 DIAGNOSIS — I5022 Chronic systolic (congestive) heart failure: Secondary | ICD-10-CM | POA: Diagnosis not present

## 2021-03-31 ENCOUNTER — Ambulatory Visit: Payer: Medicare HMO | Admitting: Cardiology

## 2021-03-31 ENCOUNTER — Encounter: Payer: Self-pay | Admitting: Cardiology

## 2021-03-31 ENCOUNTER — Other Ambulatory Visit: Payer: Self-pay

## 2021-03-31 VITALS — BP 142/82 | HR 94 | Ht 62.0 in | Wt 186.0 lb

## 2021-03-31 DIAGNOSIS — I10 Essential (primary) hypertension: Secondary | ICD-10-CM

## 2021-03-31 DIAGNOSIS — I110 Hypertensive heart disease with heart failure: Secondary | ICD-10-CM

## 2021-03-31 DIAGNOSIS — Z794 Long term (current) use of insulin: Secondary | ICD-10-CM

## 2021-03-31 DIAGNOSIS — E785 Hyperlipidemia, unspecified: Secondary | ICD-10-CM | POA: Diagnosis not present

## 2021-03-31 DIAGNOSIS — E118 Type 2 diabetes mellitus with unspecified complications: Secondary | ICD-10-CM

## 2021-03-31 DIAGNOSIS — Z79899 Other long term (current) drug therapy: Secondary | ICD-10-CM | POA: Diagnosis not present

## 2021-03-31 DIAGNOSIS — I2510 Atherosclerotic heart disease of native coronary artery without angina pectoris: Secondary | ICD-10-CM

## 2021-03-31 DIAGNOSIS — Z95810 Presence of automatic (implantable) cardiac defibrillator: Secondary | ICD-10-CM

## 2021-03-31 DIAGNOSIS — I130 Hypertensive heart and chronic kidney disease with heart failure and stage 1 through stage 4 chronic kidney disease, or unspecified chronic kidney disease: Secondary | ICD-10-CM | POA: Diagnosis not present

## 2021-03-31 DIAGNOSIS — I480 Paroxysmal atrial fibrillation: Secondary | ICD-10-CM

## 2021-03-31 DIAGNOSIS — E1140 Type 2 diabetes mellitus with diabetic neuropathy, unspecified: Secondary | ICD-10-CM | POA: Diagnosis not present

## 2021-03-31 DIAGNOSIS — I442 Atrioventricular block, complete: Secondary | ICD-10-CM | POA: Diagnosis not present

## 2021-03-31 DIAGNOSIS — I5022 Chronic systolic (congestive) heart failure: Secondary | ICD-10-CM

## 2021-03-31 DIAGNOSIS — N1831 Chronic kidney disease, stage 3a: Secondary | ICD-10-CM | POA: Diagnosis not present

## 2021-03-31 MED ORDER — SPIRONOLACTONE 25 MG PO TABS: 12.5000 mg | ORAL_TABLET | Freq: Every day | ORAL | 3 refills | Status: DC

## 2021-03-31 MED ORDER — ENTRESTO 49-51 MG PO TABS: 1.0000 | ORAL_TABLET | Freq: Two times a day (BID) | ORAL | 1 refills | Status: DC

## 2021-03-31 NOTE — Patient Instructions (Signed)
Medication Instructions:   START TAKING SPIRONOLACTONE 12.5 MG BY MOUTH DAILY  INCREASE YOUR ENTRESTO TO 49/51 MG DOSE-TAKE BY MOUTH TWICE DAILY  *If you need a refill on your cardiac medications before your next appointment, please call your pharmacy*   Lab Work:  [facility]--CHECK BMET  If you have labs (blood work) drawn today and your tests are completely normal, you will receive your results only by: [place] (if you have MyChart) OR A paper copy in the mail If you have any lab test that is abnormal or we need to change your treatment, we will call you to review the results.   Follow-Up: At [facility], you and your health needs are our priority.  As part of our continuing mission to provide you with exceptional heart care, we have created designated Provider Care Teams.  These Care Teams include your primary Cardiologist (physician) and Advanced Practice Providers (APPs -  Physician Assistants and Nurse Practitioners) who all work together to provide you with the care you need, when you need it.  We recommend signing up for the patient portal called "MyChart".  Sign up information is provided on this After Visit Summary.  MyChart is used to connect with patients for Virtual Visits (Telemedicine).  Patients are able to view lab/test results, encounter notes, upcoming appointments, etc.  Non-urgent messages can be sent to your provider as well.   To learn more about what you can do with MyChart, go to [email or web address].    Your next appointment:   6 month(s)  The format for your next appointment:   In Person  Provider:   Gwyndolyn Kaufman, MD

## 2021-04-01 NOTE — Progress Notes (Signed)
EPIC Encounter for ICM Monitoring  Patient Name: Laurie [name] is a 75 y.o. female Date: 04/01/2021 Primary Care Physican: Haywood Pao, MD Primary Cardiologist: Johney Frame Electrophysiologist: Vergie Living Pacing:  > 99%       03/31/2021 Office Weight: 186 lbs         Transmission Reviewed.   CorVue Thoracic impedance normal.   Prescribed:  Spironolactone 25 mg take 0.5 tablet (12.5 mg total) daily.   Jardiance 10 mg take 1 tablet daily   Labs:  BMET Scheduled 04/08/2021 07/16/2020 Creatinine 0.94, BUN 14, Potassium 4.1, Sodium 141, GFR 60-70 02/20/2020 Creatinine 0.88, BUN 24, Potassium 4.3, Sodium 138, GFR 65-75 01/30/2020 Creatinine 1.2,   BUN 7,   Potassium 3.8, Sodium 133, GFR 44-53.3 A complete set of results can be found in Results Review.   Recommendations:  No changes.   Follow-up plan: ICM clinic phone appointment on 05/04/2021.   91 day device clinic remote transmission 05/13/2021.   EP/Cardiology Office Visits: Recall 09/27/2021 with Dr Johney Frame.  Recall 07/11/2021 with Dr [name]   Copy of ICM check sent to Dr. [name].    3 month ICM trend: 03/17/2021.    [name], RN 04/01/2021 10:39 AM

## 2021-04-08 ENCOUNTER — Other Ambulatory Visit: Payer: Medicare HMO

## 2021-05-04 ENCOUNTER — Ambulatory Visit (INDEPENDENT_AMBULATORY_CARE_PROVIDER_SITE_OTHER): Payer: Medicare HMO

## 2021-05-04 DIAGNOSIS — Z95810 Presence of automatic (implantable) cardiac defibrillator: Secondary | ICD-10-CM

## 2021-05-04 DIAGNOSIS — I5022 Chronic systolic (congestive) heart failure: Secondary | ICD-10-CM

## 2021-05-06 NOTE — Progress Notes (Signed)
EPIC Encounter for ICM Monitoring  Patient Name: Laurie [name] is a 75 y.o. female Date: 05/06/2021 Primary Care Physican: Haywood Pao, MD Primary Cardiologist: Johney Frame Electrophysiologist: Vergie Living Pacing:  > 99%       03/31/2021 Office Weight: 186 lbs         Transmission Reviewed.   CorVue Thoracic impedance normal.   Prescribed:  Spironolactone 25 mg take 0.5 tablet (12.5 mg total) daily.   Jardiance 10 mg take 1 tablet daily   Labs:   07/16/2020 Creatinine 0.94, BUN 14, Potassium 4.1, Sodium 141, GFR 60-70 02/20/2020 Creatinine 0.88, BUN 24, Potassium 4.3, Sodium 138, GFR 65-75 01/30/2020 Creatinine 1.2,   BUN 7,   Potassium 3.8, Sodium 133, GFR 44-53.3 A complete set of results can be found in Results Review.   Recommendations:  No changes.   Follow-up plan: ICM clinic phone appointment on 06/15/2021.   91 day device clinic remote transmission 05/13/2021.   EP/Cardiology Office Visits: Recall 09/27/2021 with Dr Johney Frame.  Recall 07/11/2021 with Dr [name]   Copy of ICM check sent to Dr. [name].     3 month ICM trend: 05/04/2021.    1 Year ICM trend:       [name], RN 05/06/2021 5:04 PM

## 2021-05-13 ENCOUNTER — Ambulatory Visit (INDEPENDENT_AMBULATORY_CARE_PROVIDER_SITE_OTHER): Payer: Medicare HMO

## 2021-05-13 DIAGNOSIS — I429 Cardiomyopathy, unspecified: Secondary | ICD-10-CM | POA: Diagnosis not present

## 2021-05-19 LAB — CUP PACEART REMOTE DEVICE CHECK
Battery Remaining Longevity: 25 mo
Battery Remaining Percentage: 30 %
Battery Voltage: 2.89 V
Brady Statistic AP VP Percent: 12 %
Brady Statistic AP VS Percent: 1 %
Brady Statistic AS VP Percent: 87 %
Brady Statistic AS VS Percent: 1 %
Brady Statistic RA Percent Paced: 11 %
Date Time Interrogation Session: 20220831022800
HighPow Impedance: 69 Ohm
HighPow Impedance: 69 Ohm
Implantable Lead Implant Date: 20140210
Implantable Lead Implant Date: 20170222
Implantable Lead Implant Date: 20170222
Implantable Lead Location: 753858
Implantable Lead Location: 753859
Implantable Lead Location: 753860
Implantable Lead Model: 4298
Implantable Lead Model: 5076
Implantable Lead Model: 7122
Implantable Pulse Generator Implant Date: 20170222
Lead Channel Impedance Value: 380 Ohm
Lead Channel Impedance Value: 380 Ohm
Lead Channel Impedance Value: 450 Ohm
Lead Channel Pacing Threshold Amplitude: 0.75 V
Lead Channel Pacing Threshold Amplitude: 0.75 V
Lead Channel Pacing Threshold Amplitude: 0.875 V
Lead Channel Pacing Threshold Pulse Width: 0.5 ms
Lead Channel Pacing Threshold Pulse Width: 0.5 ms
Lead Channel Pacing Threshold Pulse Width: 0.5 ms
Lead Channel Sensing Intrinsic Amplitude: 12 mV
Lead Channel Sensing Intrinsic Amplitude: 3 mV
Lead Channel Setting Pacing Amplitude: 2 V
Lead Channel Setting Pacing Amplitude: 2 V
Lead Channel Setting Pacing Amplitude: 2 V
Lead Channel Setting Pacing Pulse Width: 0.5 ms
Lead Channel Setting Pacing Pulse Width: 0.5 ms
Lead Channel Setting Sensing Sensitivity: 0.5 mV
Pulse Gen Serial Number: 7341405

## 2021-05-26 NOTE — Progress Notes (Signed)
Remote ICD transmission.   

## 2021-05-29 ENCOUNTER — Other Ambulatory Visit (HOSPITAL_COMMUNITY): Payer: Self-pay

## 2021-06-08 DIAGNOSIS — I130 Hypertensive heart and chronic kidney disease with heart failure and stage 1 through stage 4 chronic kidney disease, or unspecified chronic kidney disease: Secondary | ICD-10-CM | POA: Diagnosis not present

## 2021-06-08 DIAGNOSIS — Z794 Long term (current) use of insulin: Secondary | ICD-10-CM | POA: Diagnosis not present

## 2021-06-08 DIAGNOSIS — E1151 Type 2 diabetes mellitus with diabetic peripheral angiopathy without gangrene: Secondary | ICD-10-CM | POA: Diagnosis not present

## 2021-06-08 DIAGNOSIS — I5022 Chronic systolic (congestive) heart failure: Secondary | ICD-10-CM | POA: Diagnosis not present

## 2021-06-08 DIAGNOSIS — E1140 Type 2 diabetes mellitus with diabetic neuropathy, unspecified: Secondary | ICD-10-CM | POA: Diagnosis not present

## 2021-06-08 DIAGNOSIS — R2689 Other abnormalities of gait and mobility: Secondary | ICD-10-CM | POA: Diagnosis not present

## 2021-06-08 DIAGNOSIS — F0390 Unspecified dementia without behavioral disturbance: Secondary | ICD-10-CM | POA: Diagnosis not present

## 2021-06-08 DIAGNOSIS — I2510 Atherosclerotic heart disease of native coronary artery without angina pectoris: Secondary | ICD-10-CM | POA: Diagnosis not present

## 2021-06-08 DIAGNOSIS — E261 Secondary hyperaldosteronism: Secondary | ICD-10-CM | POA: Diagnosis not present

## 2021-06-08 DIAGNOSIS — Z23 Encounter for immunization: Secondary | ICD-10-CM | POA: Diagnosis not present

## 2021-06-15 ENCOUNTER — Ambulatory Visit (INDEPENDENT_AMBULATORY_CARE_PROVIDER_SITE_OTHER): Payer: Medicare HMO

## 2021-06-15 DIAGNOSIS — I5022 Chronic systolic (congestive) heart failure: Secondary | ICD-10-CM

## 2021-06-15 DIAGNOSIS — Z95810 Presence of automatic (implantable) cardiac defibrillator: Secondary | ICD-10-CM | POA: Diagnosis not present

## 2021-06-16 ENCOUNTER — Telehealth: Payer: Self-pay

## 2021-06-16 NOTE — Telephone Encounter (Signed)
Remote ICM transmission received.  Attempted call to patient regarding ICM remote transmission and left detailed message per DPR.  Advised to return call for any fluid symptoms or questions.  

## 2021-06-16 NOTE — Progress Notes (Signed)
EPIC Encounter for ICM Monitoring  Patient Name: Laurie [name] is a 75 y.o. female Date: 06/16/2021 Primary Care Physican: Haywood Pao, MD Primary Cardiologist: Johney Frame Electrophysiologist: Vergie Living Pacing:  > 99%       03/31/2021 Office Weight: 186 lbs  AT/AF Burden <1%         Attempted call to patient and unable to reach.  Left detailed message per DPR regarding transmission. Transmission reviewed.    CorVue Thoracic impedance suggesting possible fluid accumulation starting 9/30.   Prescribed:  Spironolactone 25 mg take 0.5 tablet (12.5 mg total) daily.   Jardiance 10 mg take 1 tablet daily   Labs:  Did not have BMET drawn in July after Dr Jacolyn Reedy OV as ordered 07/16/2020 Creatinine 0.94, BUN 14, Potassium 4.1, Sodium 141, GFR 60-70 02/20/2020 Creatinine 0.88, BUN 24, Potassium 4.3, Sodium 138, GFR 65-75 01/30/2020 Creatinine 1.2,   BUN 7,   Potassium 3.8, Sodium 133, GFR 44-53.3 A complete set of results can be found in Results Review.   Recommendations:  Left voice mail with ICM number and encouraged to call if experiencing any fluid symptoms.   Follow-up plan: ICM clinic phone appointment on 06/24/2021 to recheck fluid levels.   91 day device clinic remote transmission 08/12/2021.   EP/Cardiology Office Visits: Recall 09/27/2021 with Dr Johney Frame.  Recall 07/11/2021 with Dr [name]   Copy of ICM check sent to Dr. [name].   Will send to Dr Johney Frame for review if patient is reached.  3 month ICM trend: 06/15/2021.    1 Year ICM trend:     AT/AF    [name], RN 06/16/2021 8:00 AM

## 2021-06-24 ENCOUNTER — Ambulatory Visit (INDEPENDENT_AMBULATORY_CARE_PROVIDER_SITE_OTHER): Payer: Medicare HMO

## 2021-06-24 DIAGNOSIS — Z95810 Presence of automatic (implantable) cardiac defibrillator: Secondary | ICD-10-CM

## 2021-06-24 DIAGNOSIS — I5022 Chronic systolic (congestive) heart failure: Secondary | ICD-10-CM

## 2021-06-26 NOTE — Progress Notes (Signed)
EPIC Encounter for ICM Monitoring  Patient Name: Laurie [name] is a 75 y.o. female Date: 06/26/2021 Primary Care Physican: Haywood Pao, MD Primary Cardiologist: Johney Frame Electrophysiologist: Vergie Living Pacing:  > 99%       03/31/2021 Office Weight: 186 lbs   AT/AF Burden <1%         Transmission reviewed.    CorVue Thoracic impedance suggesting fluid levels returned to normal.   Prescribed:  Spironolactone 25 mg take 0.5 tablet (12.5 mg total) daily.   Jardiance 10 mg take 1 tablet daily   Labs:  Did not have BMET drawn in July after Dr Jacolyn Reedy OV as ordered 07/16/2020 Creatinine 0.94, BUN 14, Potassium 4.1, Sodium 141, GFR 60-70 02/20/2020 Creatinine 0.88, BUN 24, Potassium 4.3, Sodium 138, GFR 65-75 01/30/2020 Creatinine 1.2,   BUN 7,   Potassium 3.8, Sodium 133, GFR 44-53.3 A complete set of results can be found in Results Review.   Recommendations:  No changes.   Follow-up plan: ICM clinic phone appointment on 07/27/2021.   91 day device clinic remote transmission 08/12/2021.   EP/Cardiology Office Visits: Recall 09/27/2021 with Dr Johney Frame.  Recall 07/11/2021 with Dr [name]   Copy of ICM check sent to Dr. [name].    3 month ICM trend: 06/24/2021.    1 Year ICM trend:       [name], RN 06/26/2021 4:45 PM

## 2021-07-27 ENCOUNTER — Ambulatory Visit (INDEPENDENT_AMBULATORY_CARE_PROVIDER_SITE_OTHER): Payer: Medicare HMO

## 2021-07-27 DIAGNOSIS — Z95810 Presence of automatic (implantable) cardiac defibrillator: Secondary | ICD-10-CM | POA: Diagnosis not present

## 2021-07-27 DIAGNOSIS — I5022 Chronic systolic (congestive) heart failure: Secondary | ICD-10-CM

## 2021-07-28 ENCOUNTER — Telehealth: Payer: Self-pay | Admitting: *Deleted

## 2021-07-28 NOTE — Telephone Encounter (Signed)
-----   Message from Freada Bergeron, MD sent at 07/28/2021  5:11 PM EST ----- Thank you so much!  Pax Reasoner, do you think we can get her in sooner? I am afraid she will wait until her fluid levels get real bad.  ----- Message ----- From: [name], RN Sent: 07/28/2021   2:15 PM EST To: Freada Bergeron, MD  Dr Johney Frame, sent as Juluis Rainier for review due to recent possible fluid accumulation.  Thank you.

## 2021-07-28 NOTE — Telephone Encounter (Signed)
Left the pt a message to call the office back tomorrow, to assist with getting her a sooner appt with Dr. Johney Frame  There is an availability on Dr. Jacolyn Reedy schedule for next Tuesday 11/22 at 0920 we can offer the pt.  I have held this slot for the pt.  Left her a detailed message to return a call back so appt can be offered and scheduled.  Will send this message to Sharman Cheek RN as well, to assist with getting in touch with the pt.

## 2021-07-28 NOTE — Progress Notes (Signed)
EPIC Encounter for ICM Monitoring  Patient Name: Laurie [name] is a 75 y.o. female Date: 07/28/2021 Primary Care Physican: Haywood Pao, MD Primary Cardiologist: Johney Frame Electrophysiologist: Vergie Living Pacing:  > 99%       03/31/2021 Office Weight: 186 lbs (does not weigh at home)   AT/AF Burden <1%         Spoke with patient and heart failure questions reviewed.  Pt has had some swelling of feet in last couple of weeks but has resolved.  She eats food and does not review food labels for salt content.   CorVue Thoracic impedance suggesting possible fluid accumulation for 18 days starting 07/09/2021 and returned to baseline 11/14.  Impedance also suggesting long periods of fluid accumulation in the last 3 months from 9/6-9/17 and 9/30-10/11.     Prescribed:  Spironolactone 25 mg take 0.5 tablet (12.5 mg total) daily.   Jardiance 10 mg take 1 tablet daily   Labs: Did not have BMET drawn after July office visit with Dr Johney Frame. 07/16/2020 Creatinine 0.94, BUN 14, Potassium 4.1, Sodium 141, GFR 60-70 02/20/2020 Creatinine 0.88, BUN 24, Potassium 4.3, Sodium 138, GFR 65-75 01/30/2020 Creatinine 1.2,   BUN 7,   Potassium 3.8, Sodium 133, GFR 44-53.3 A complete set of results can be found in Results Review.   Recommendations:  Recommendation to limit salt intake to 2000 mg daily and to avoid fast food if possible since these foods are typically very high in salt.  Encouraged to call if experiencing any fluid symptoms.    Follow-up plan: ICM clinic phone appointment on 08/31/2021.   91 day device clinic remote transmission 08/12/2021.   EP/Cardiology Office Visits: Recall 09/27/2021 with Dr Johney Frame.  Recall 07/11/2021 with Dr [name]   Copy of ICM check sent to Dr. [name] and Dr Johney Frame as Juluis Rainier due to long periods of possible fluid accumulation.     3 month ICM trend: 07/27/2021.    12-14 Month ICM trend:       [name], RN 07/28/2021 1:52 PM

## 2021-07-29 ENCOUNTER — Telehealth: Payer: Self-pay | Admitting: *Deleted

## 2021-07-29 NOTE — Telephone Encounter (Signed)
Attempted call to patient.  Left message regarding Dr Jacolyn Reedy recommendations for office visit.  Requested a call back regarding open appointment on 11/22 at 9:20 AM.  Provided call back number.

## 2021-07-29 NOTE — Telephone Encounter (Signed)
Scheduling was able to make contact with the pt.  She is scheduled for next Tuesday 11/22 at 0920 to see Dr. Johney Frame.  Pt agreed to appt date and time.

## 2021-07-29 NOTE — Telephone Encounter (Signed)
-----   Message from Trilby Drummer sent at 07/29/2021  2:42 PM EST ----- Regarding: RE: pt needs sooner appt per Johney Frame Just got her scheduled.  ----- Message ----- From: Nuala Alpha, LPN Sent: 01/41/0301   8:42 AM EST To: Mila Homer, # Subject: pt needs sooner appt per Johney Frame             As indicated below this pt needs a sooner appt to see [facility] or APP to follow her fluid levels, based on recent ICM monitoring report.  I have held her a slot on Pemberton's schedule for next Tuesday 11/22 at 0920.  Can you try to get in touch with her and schedule at that time, or with an APP on a different day if she can't come then.  I tried calling her a couple times last night and she didn't answer.  Thanks for all you do, [employer]        Message Received: Therese Sarah, MD  Short, Laurie Panda, RN; Nuala Alpha, LPN Thank you so much!   Korver Graybeal, do you think we can get her in sooner? I am afraid she will wait until her fluid levels get real bad.     Previous Messages  ----- Message -----  From: [name], RN  Sent: 07/28/2021  2:15 PM EST  To: Freada Bergeron, MD   Dr Johney Frame, sent as Juluis Rainier for review due to recent possible fluid accumulation. Thank you.

## 2021-07-29 NOTE — Telephone Encounter (Signed)
Attempted call to daughter, [name], per [facility].  Left message with name and number to return call regarding physician appointment for patient.

## 2021-07-29 NOTE — Telephone Encounter (Signed)
07/28/21 8:40am LVM to schedule appt with Dr. Johney Frame next week per Karlene Einstein. Spot on hold on 08/04/21 at 9:20am - LCN

## 2021-08-04 ENCOUNTER — Ambulatory Visit: Payer: Medicare HMO | Admitting: Cardiology

## 2021-08-12 ENCOUNTER — Ambulatory Visit (INDEPENDENT_AMBULATORY_CARE_PROVIDER_SITE_OTHER): Payer: Medicare HMO

## 2021-08-12 DIAGNOSIS — I442 Atrioventricular block, complete: Secondary | ICD-10-CM | POA: Diagnosis not present

## 2021-08-13 LAB — CUP PACEART REMOTE DEVICE CHECK
Battery Remaining Longevity: 23 mo
Battery Remaining Percentage: 28 %
Battery Voltage: 2.87 V
Brady Statistic AP VP Percent: 9.4 %
Brady Statistic AP VS Percent: 1 %
Brady Statistic AS VP Percent: 90 %
Brady Statistic AS VS Percent: 1 %
Brady Statistic RA Percent Paced: 8.9 %
Date Time Interrogation Session: 20221201015017
HighPow Impedance: 68 Ohm
HighPow Impedance: 68 Ohm
Implantable Lead Implant Date: 20140210
Implantable Lead Implant Date: 20170222
Implantable Lead Implant Date: 20170222
Implantable Lead Location: 753858
Implantable Lead Location: 753859
Implantable Lead Location: 753860
Implantable Lead Model: 4298
Implantable Lead Model: 5076
Implantable Lead Model: 7122
Implantable Pulse Generator Implant Date: 20170222
Lead Channel Impedance Value: 360 Ohm
Lead Channel Impedance Value: 380 Ohm
Lead Channel Impedance Value: 410 Ohm
Lead Channel Pacing Threshold Amplitude: 0.75 V
Lead Channel Pacing Threshold Amplitude: 0.75 V
Lead Channel Pacing Threshold Amplitude: 0.875 V
Lead Channel Pacing Threshold Pulse Width: 0.5 ms
Lead Channel Pacing Threshold Pulse Width: 0.5 ms
Lead Channel Pacing Threshold Pulse Width: 0.5 ms
Lead Channel Sensing Intrinsic Amplitude: 12 mV
Lead Channel Sensing Intrinsic Amplitude: 2.6 mV
Lead Channel Setting Pacing Amplitude: 2 V
Lead Channel Setting Pacing Amplitude: 2 V
Lead Channel Setting Pacing Amplitude: 2 V
Lead Channel Setting Pacing Pulse Width: 0.5 ms
Lead Channel Setting Pacing Pulse Width: 0.5 ms
Lead Channel Setting Sensing Sensitivity: 0.5 mV
Pulse Gen Serial Number: 7341405

## 2021-08-21 NOTE — Progress Notes (Signed)
Remote ICD transmission.   

## 2021-08-31 ENCOUNTER — Encounter: Payer: Self-pay | Admitting: Internal Medicine

## 2021-08-31 ENCOUNTER — Ambulatory Visit (INDEPENDENT_AMBULATORY_CARE_PROVIDER_SITE_OTHER): Payer: Medicare HMO

## 2021-08-31 DIAGNOSIS — I5022 Chronic systolic (congestive) heart failure: Secondary | ICD-10-CM | POA: Diagnosis not present

## 2021-08-31 DIAGNOSIS — Z95810 Presence of automatic (implantable) cardiac defibrillator: Secondary | ICD-10-CM | POA: Diagnosis not present

## 2021-09-02 ENCOUNTER — Telehealth: Payer: Self-pay

## 2021-09-02 NOTE — Telephone Encounter (Signed)
Remote ICM transmission received.  Attempted call to patient regarding ICM remote transmission and left detailed message per DPR.  Advised to return call for any fluid symptoms or questions. Next ICM remote transmission scheduled 10/05/2021.   ° °

## 2021-09-02 NOTE — Progress Notes (Signed)
EPIC Encounter for ICM Monitoring  Patient Name: Laurie [name] is a 75 y.o. female Date: 09/02/2021 Primary Care Physican: Haywood Pao, MD Primary Cardiologist: Johney Frame Electrophysiologist: Vergie Living Pacing:  > 99%       03/31/2021 Office Weight: 186 lbs (does not weigh at home)   AT/AF Burden <1%         Attempted call to patient and unable to reach.  Left detailed message per DPR regarding transmission. Transmission reviewed.    CorVue Thoracic impedance suggesting normal fluid levels.   Prescribed:  Spironolactone 25 mg take 0.5 tablet (12.5 mg total) daily.   Jardiance 10 mg take 1 tablet daily   Labs:  07/16/2020 Creatinine 0.94, BUN 14, Potassium 4.1, Sodium 141, GFR 60-70 02/20/2020 Creatinine 0.88, BUN 24, Potassium 4.3, Sodium 138, GFR 65-75 01/30/2020 Creatinine 1.2,   BUN 7,   Potassium 3.8, Sodium 133, GFR 44-53.3 A complete set of results can be found in Results Review.   Recommendations:  Left voice mail with ICM number and encouraged to call if experiencing any fluid symptoms.   Follow-up plan: ICM clinic phone appointment on 09/15/2021.   91 day device clinic remote transmission 08/12/2021.   EP/Cardiology Office Visits: Recall 09/27/2021 with Dr Johney Frame.  Recall 07/11/2021 with Dr [name]   Copy of ICM check sent to Dr. [name].  3 month ICM trend: 08/31/2021.    12-14 Month ICM trend:       [name], RN 09/02/2021 2:56 PM

## 2021-10-06 NOTE — Progress Notes (Signed)
No ICM remote transmission received for 10/05/2021 and next ICM transmission scheduled for 10/19/2021.

## 2021-10-19 ENCOUNTER — Ambulatory Visit (INDEPENDENT_AMBULATORY_CARE_PROVIDER_SITE_OTHER): Payer: Medicare HMO

## 2021-10-19 DIAGNOSIS — I5022 Chronic systolic (congestive) heart failure: Secondary | ICD-10-CM | POA: Diagnosis not present

## 2021-10-19 DIAGNOSIS — Z95810 Presence of automatic (implantable) cardiac defibrillator: Secondary | ICD-10-CM

## 2021-10-23 ENCOUNTER — Telehealth: Payer: Self-pay

## 2021-10-23 NOTE — Progress Notes (Signed)
EPIC Encounter for ICM Monitoring  Patient Name: Laurie [name] is a 76 y.o. female Date: 10/23/2021 Primary Care Physican: Haywood Pao, MD Primary Cardiologist: Johney Frame Electrophysiologist: Vergie Living Pacing:  > 99%       03/31/2021 Office Weight: 186 lbs (does not weigh at home)   AT/AF Burden <1%         Attempted call to patient and unable to reach.  Left detailed message per DPR regarding transmission. Transmission reviewed.    CorVue Thoracic impedance suggesting possible fluid accumulation starting 1/25.   Prescribed:  Spironolactone 25 mg take 0.5 tablet (12.5 mg total) daily.   Jardiance 10 mg take 1 tablet daily   Labs:  07/16/2020 Creatinine 0.94, BUN 14, Potassium 4.1, Sodium 141, GFR 60-70 02/20/2020 Creatinine 0.88, BUN 24, Potassium 4.3, Sodium 138, GFR 65-75 01/30/2020 Creatinine 1.2,   BUN 7,   Potassium 3.8, Sodium 133, GFR 44-53.3 A complete set of results can be found in Results Review.   Recommendations:  Unable to reach.     Follow-up plan: ICM clinic phone appointment on 11/02/2021 to recheck fluid levels.   91 day device clinic remote transmission 11/11/2021.   EP/Cardiology Office Visits: Recall 09/27/2021 with Dr Johney Frame.  Recall 07/11/2021 with Dr [name]   Copy of ICM check sent to Dr. [name].   Sent to Dr Johney Frame as Juluis Rainier since unable to reach patient.  3 month ICM trend: 10/21/2021.    12-14 Month ICM trend:     [name], RN 10/23/2021 7:47 AM

## 2021-10-23 NOTE — Telephone Encounter (Signed)
Remote ICM transmission received.  Attempted call to patient regarding ICM remote transmission and left detailed message per DPR to return call.   

## 2021-11-06 NOTE — Progress Notes (Signed)
No ICM remote transmission received for 11/02/2021 and next ICM transmission scheduled for 11/16/2021.

## 2021-11-11 ENCOUNTER — Ambulatory Visit (INDEPENDENT_AMBULATORY_CARE_PROVIDER_SITE_OTHER): Payer: Medicare HMO

## 2021-11-11 DIAGNOSIS — I442 Atrioventricular block, complete: Secondary | ICD-10-CM | POA: Diagnosis not present

## 2021-11-16 LAB — CUP PACEART REMOTE DEVICE CHECK
Battery Remaining Longevity: 19 mo
Battery Remaining Percentage: 23 %
Battery Voltage: 2.8 V
Brady Statistic AP VP Percent: 7.7 %
Brady Statistic AP VS Percent: 1 %
Brady Statistic AS VP Percent: 92 %
Brady Statistic AS VS Percent: 1 %
Brady Statistic RA Percent Paced: 7.3 %
Date Time Interrogation Session: 20230303043339
HighPow Impedance: 61 Ohm
HighPow Impedance: 61 Ohm
Implantable Lead Implant Date: 20140210
Implantable Lead Implant Date: 20170222
Implantable Lead Implant Date: 20170222
Implantable Lead Location: 753858
Implantable Lead Location: 753859
Implantable Lead Location: 753860
Implantable Lead Model: 4298
Implantable Lead Model: 5076
Implantable Lead Model: 7122
Implantable Pulse Generator Implant Date: 20170222
Lead Channel Impedance Value: 360 Ohm
Lead Channel Impedance Value: 380 Ohm
Lead Channel Impedance Value: 440 Ohm
Lead Channel Pacing Threshold Amplitude: 0.75 V
Lead Channel Pacing Threshold Amplitude: 0.875 V
Lead Channel Pacing Threshold Amplitude: 1 V
Lead Channel Pacing Threshold Pulse Width: 0.5 ms
Lead Channel Pacing Threshold Pulse Width: 0.5 ms
Lead Channel Pacing Threshold Pulse Width: 0.5 ms
Lead Channel Sensing Intrinsic Amplitude: 12 mV
Lead Channel Sensing Intrinsic Amplitude: 2.2 mV
Lead Channel Setting Pacing Amplitude: 2 V
Lead Channel Setting Pacing Amplitude: 2 V
Lead Channel Setting Pacing Amplitude: 2 V
Lead Channel Setting Pacing Pulse Width: 0.5 ms
Lead Channel Setting Pacing Pulse Width: 0.5 ms
Lead Channel Setting Sensing Sensitivity: 0.5 mV
Pulse Gen Serial Number: 7341405

## 2021-11-18 NOTE — Progress Notes (Signed)
Remote ICD transmission.   

## 2021-11-26 ENCOUNTER — Telehealth: Payer: Self-pay

## 2021-11-26 NOTE — Telephone Encounter (Signed)
LMOVM for patient to send missed ICM transmission. 

## 2021-11-27 NOTE — Progress Notes (Signed)
No ICM remote transmission received for 11/23/2021 and next ICM transmission scheduled for 12/21/2021.   ?

## 2021-12-21 ENCOUNTER — Telehealth: Payer: Self-pay

## 2021-12-21 NOTE — Telephone Encounter (Signed)
Attempted ICM Call to patient.  Left message regarding device monitor is disconnected and to check if machine is plugged in and working.  Requested to send remote transmission to check monthly fluid levels. Left call back number for assistance if needed.  ? ?

## 2021-12-23 NOTE — Progress Notes (Signed)
No ICM remote transmission received for 12/21/2021 and next ICM transmission scheduled for 01/04/2022.   ?

## 2022-01-06 ENCOUNTER — Telehealth: Payer: Self-pay

## 2022-01-06 NOTE — Telephone Encounter (Signed)
LMOVM for patient to send missed ICM transmission. 

## 2022-01-07 NOTE — Progress Notes (Signed)
Unable to reach patient for monthly ICM remote follow up and not receiving monthly remote transmission due to monitor does not stay connected.  Patient disenrolled due patient is not actively participating in [facility] clinic.  Device clinic 91 day remote monitoring will continue per protocol.   ?

## 2022-02-10 ENCOUNTER — Ambulatory Visit (INDEPENDENT_AMBULATORY_CARE_PROVIDER_SITE_OTHER): Payer: Medicare HMO

## 2022-02-10 DIAGNOSIS — I442 Atrioventricular block, complete: Secondary | ICD-10-CM | POA: Diagnosis not present

## 2022-02-11 LAB — CUP PACEART REMOTE DEVICE CHECK
Battery Remaining Longevity: 23 mo
Battery Remaining Percentage: 28 %
Battery Voltage: 2.84 V
Brady Statistic AP VP Percent: 6.5 %
Brady Statistic AP VS Percent: 1 %
Brady Statistic AS VP Percent: 93 %
Brady Statistic AS VS Percent: 1 %
Brady Statistic RA Percent Paced: 6.2 %
Date Time Interrogation Session: 20230601130225
HighPow Impedance: 65 Ohm
HighPow Impedance: 65 Ohm
Implantable Lead Implant Date: 20140210
Implantable Lead Implant Date: 20170222
Implantable Lead Implant Date: 20170222
Implantable Lead Location: 753858
Implantable Lead Location: 753859
Implantable Lead Location: 753860
Implantable Lead Model: 4298
Implantable Lead Model: 5076
Implantable Lead Model: 7122
Implantable Pulse Generator Implant Date: 20170222
Lead Channel Impedance Value: 380 Ohm
Lead Channel Impedance Value: 380 Ohm
Lead Channel Impedance Value: 450 Ohm
Lead Channel Pacing Threshold Amplitude: 0.75 V
Lead Channel Pacing Threshold Amplitude: 1 V
Lead Channel Pacing Threshold Amplitude: 1 V
Lead Channel Pacing Threshold Pulse Width: 0.5 ms
Lead Channel Pacing Threshold Pulse Width: 0.5 ms
Lead Channel Pacing Threshold Pulse Width: 0.5 ms
Lead Channel Sensing Intrinsic Amplitude: 12 mV
Lead Channel Sensing Intrinsic Amplitude: 2.9 mV
Lead Channel Setting Pacing Amplitude: 2 V
Lead Channel Setting Pacing Amplitude: 2 V
Lead Channel Setting Pacing Amplitude: 2 V
Lead Channel Setting Pacing Pulse Width: 0.5 ms
Lead Channel Setting Pacing Pulse Width: 0.5 ms
Lead Channel Setting Sensing Sensitivity: 0.5 mV
Pulse Gen Serial Number: 7341405

## 2022-02-20 DIAGNOSIS — Y998 Other external cause status: Secondary | ICD-10-CM | POA: Diagnosis not present

## 2022-02-20 DIAGNOSIS — Z041 Encounter for examination and observation following transport accident: Secondary | ICD-10-CM | POA: Diagnosis not present

## 2022-02-20 DIAGNOSIS — R4182 Altered mental status, unspecified: Secondary | ICD-10-CM | POA: Diagnosis not present

## 2022-02-24 NOTE — Progress Notes (Signed)
Remote ICD transmission.   

## 2022-03-15 DIAGNOSIS — F0390 Unspecified dementia without behavioral disturbance: Secondary | ICD-10-CM | POA: Diagnosis not present

## 2022-03-15 DIAGNOSIS — Z794 Long term (current) use of insulin: Secondary | ICD-10-CM | POA: Diagnosis not present

## 2022-03-15 DIAGNOSIS — L03115 Cellulitis of right lower limb: Secondary | ICD-10-CM | POA: Diagnosis not present

## 2022-03-15 DIAGNOSIS — Z23 Encounter for immunization: Secondary | ICD-10-CM | POA: Diagnosis not present

## 2022-03-15 DIAGNOSIS — E1140 Type 2 diabetes mellitus with diabetic neuropathy, unspecified: Secondary | ICD-10-CM | POA: Diagnosis not present

## 2022-03-15 DIAGNOSIS — S81801A Unspecified open wound, right lower leg, initial encounter: Secondary | ICD-10-CM | POA: Diagnosis not present

## 2022-03-24 ENCOUNTER — Encounter: Payer: Self-pay | Admitting: Physician Assistant

## 2022-03-24 ENCOUNTER — Ambulatory Visit: Payer: Medicare HMO | Admitting: Physician Assistant

## 2022-03-24 VITALS — BP 140/86 | HR 103 | Ht 62.0 in | Wt 193.4 lb

## 2022-03-24 DIAGNOSIS — E785 Hyperlipidemia, unspecified: Secondary | ICD-10-CM

## 2022-03-24 DIAGNOSIS — I5022 Chronic systolic (congestive) heart failure: Secondary | ICD-10-CM | POA: Diagnosis not present

## 2022-03-24 DIAGNOSIS — I2510 Atherosclerotic heart disease of native coronary artery without angina pectoris: Secondary | ICD-10-CM | POA: Diagnosis not present

## 2022-03-24 DIAGNOSIS — L03115 Cellulitis of right lower limb: Secondary | ICD-10-CM | POA: Diagnosis not present

## 2022-03-24 DIAGNOSIS — E118 Type 2 diabetes mellitus with unspecified complications: Secondary | ICD-10-CM | POA: Diagnosis not present

## 2022-03-24 DIAGNOSIS — I110 Hypertensive heart disease with heart failure: Secondary | ICD-10-CM

## 2022-03-24 DIAGNOSIS — L02415 Cutaneous abscess of right lower limb: Secondary | ICD-10-CM

## 2022-03-24 DIAGNOSIS — I480 Paroxysmal atrial fibrillation: Secondary | ICD-10-CM | POA: Diagnosis not present

## 2022-03-24 DIAGNOSIS — Z794 Long term (current) use of insulin: Secondary | ICD-10-CM | POA: Diagnosis not present

## 2022-03-24 NOTE — Progress Notes (Signed)
Cardiology Office Note    Date:  03/24/2022   ID:  Laurie [name], DOB 07-18-1946, MRN [id number]   PCP:  Laurie Pao, MD   [place]  Cardiologist:  Laurie Bergeron, MD   Advanced Practice Provider:  No care team member to display Electrophysiologist:  None   [phone]   Chief Complaint  Patient presents with   Cellulitis    History of Present Illness:  Laurie [name] is a 76 y.o. female with a hx of with a history of CAD (CABG 2000 >> 12/2019 POBA to SVG OM; PCI to SVG), Bells Palsy, HTN, HLD, chronic systolic heart failure, AFib, CVA (left with chronic balance problems), OSA (uses CPAP), DM, CHB s/p SJM CRT-D 11/05/2015 (RV/LV leads 2017), RA lead 2014.  Patient last saw Dr. Johney [name] 03/31/21 and doing well. Entresto increased and spironolactone resumed.  Patient seen in urgent care 03/15/22 with skin tear right lower leg and found to have cellulitis. She was given Keflex and daily dressing changes with adaptic.   Patient denies chest pain. Gets short of breath when rushing. No palpitations. Her daughter is helping her with her meds because of dementia. She is now having pain when walking and doesn't think it's healing well. Still on keflex. Hasn't seen PCP.    Past Medical History:  Diagnosis Date   AICD (automatic cardioverter/defibrillator) present    Bell's palsy 01/05/2010   CAD (coronary artery disease)    Cath  07/21/1999  mild ostial, 80% stenosis proximal LAD, 90% stenosis proximal Diag 1, 95% stenosis proximal OM 1, 80% stenosis proximal RCA, 95% stenosis mid RCA    CABG 07/22/99  LIMA to LAD, SVG to dx, SVG to OM, SVG to PDA  Dr. Cyndia [name]   Cardiolite 2012 no ischemia EF 67%  Cath 10/23/12 Severe native three-vessel coronary artery disease with occlusion of all 3 native coronary arteries, Patent sap   Chronic lower back pain    Chronic systolic CHF (congestive heart failure) ([place]) 10/22/2015   Cystitis    Gait abnormality     Hepatitis 1970s   "don't know which" (03/17/2017)   HIstory of Bell's palsy    October 2012    Hyperlipidemia    Intolerance to several statins    Hypertensive heart disease without CHF    Hypoxia 01/09/2020   Memory loss    Multinodular goiter    Obesity (BMI 30-39.9)    OSA on CPAP    "suppose to wear a mask" (03/17/2017)   Peripheral neuropathy    Pneumonia 1990s X 1   "think i had walking pneumonia"   Seasonal allergies    Stroke ([place]) 03/2017   "light one"; denies residual on 03/17/2017)   Type II diabetes mellitus ([place])     Past Surgical History:  Procedure Laterality Date   BI-VENTRICULAR PACEMAKER UPGRADE  11/05/2015   "upgraded my pacemaker"   BIOPSY THYROID  05/2010   percutaneous   CARDIAC CATHETERIZATION N/A 10/21/2015   Procedure: Left Heart Cath and Cors/Grafts Angiography;  Surgeon: Laurie Crome, MD;  Location: [place] [facility];  Service: Cardiovascular;  Laterality: N/A;   CARPAL TUNNEL RELEASE Bilateral    CATARACT EXTRACTION Right 01/06/2015   CATARACT EXTRACTION W/ INTRAOCULAR LENS IMPLANT Left 12/16/2014   CORONARY ARTERY BYPASS GRAFT  2000   CORONARY BALLOON ANGIOPLASTY N/A 01/09/2020   Procedure: CORONARY BALLOON ANGIOPLASTY;  Surgeon: Laurie Hampshire, MD;  Location: [place] [facility];  Service: Cardiovascular;  Laterality: N/A;   CORONARY STENT INTERVENTION N/A 10/28/2017   Procedure: CORONARY STENT INTERVENTION;  Surgeon: Laurie Booze, MD;  Location: [place] [facility];  Service: Cardiovascular;  Laterality: N/A;   CYSTECTOMY Right    "middle finger"   DENTAL IMPLANTS     EP IMPLANTABLE DEVICE N/A 11/05/2015   Procedure: BiV Upgrade;  Surgeon: Laurie Sprang, MD;  Location: [place] [facility];  Service: Cardiovascular;  Laterality: N/A;   INSERT / REPLACE / REMOVE PACEMAKER     LEAD REVISION N/A 10/25/2012   Procedure: LEAD REVISION;  Surgeon: Laurie Lance, MD;  Location: [facility] C[facility];  Service: Cardiovascular;  Laterality: N/A;   LEFT HEART CATH  AND CORS/GRAFTS ANGIOGRAPHY N/A 10/28/2017   Procedure: LEFT HEART CATH AND CORS/GRAFTS ANGIOGRAPHY;  Surgeon: Laurie Booze, MD;  Location: [place] [facility];  Service: Cardiovascular;  Laterality: N/A;   LEFT HEART CATH AND CORS/GRAFTS ANGIOGRAPHY N/A 01/09/2020   Procedure: LEFT HEART CATH AND CORS/GRAFTS ANGIOGRAPHY;  Surgeon: Laurie Hampshire, MD;  Location: [place] [facility];  Service: Cardiovascular;  Laterality: N/A;   LEFT HEART CATHETERIZATION WITH CORONARY ANGIOGRAM N/A 10/23/2012   Procedure: LEFT HEART CATHETERIZATION WITH CORONARY ANGIOGRAM;  Surgeon: Laurie Reedy, MD;  Location: [facility] CATH LAB;  Service: Cardiovascular;  Laterality: N/A;   PERMANENT PACEMAKER INSERTION N/A 10/23/2012   Procedure: PERMANENT PACEMAKER INSERTION;  Surgeon: Laurie Sprang, MD;  Location: [facility] CATH LAB;  Service: Cardiovascular;  Laterality: N/A;   PLANTAR FASCIA RELEASE Left    "& clipped a tendon that went thru bottom of my foot"   TEMPORARY PACEMAKER INSERTION N/A 10/22/2012   Procedure: TEMPORARY PACEMAKER INSERTION;  Surgeon: Laurie Sine, MD;  Location: [facility] CATH LAB;  Service: Cardiovascular;  Laterality: N/A;   TUBAL LIGATION      Current Medications: Current Meds  Medication Sig   ACCU-CHEK FASTCLIX LANCETS MISC USE  TO CHECK BLOOD SUGAR FOUR TIMES DAILY   ACCU-CHEK SMARTVIEW test strip USE  TO CHECK BLOOD SUGAR FOUR TIMES DAILY   Alcohol Swabs (B-D SINGLE USE SWABS REGULAR) PADS Use 8 daily for testing blood sugar and insulin injections.   aspirin EC 81 MG tablet Take 81 mg by mouth daily. Swallow whole.   BD VEO INSULIN SYRINGE U/F 31G X 15/64" 1 ML MISC USE 10 TIMES A DAY WITH INSULIN AS DIRECTED   beta carotene w/minerals (OCUVITE) tablet Take 1 tablet by mouth daily.   Blood Glucose Calibration (ACCU-CHEK SMARTVIEW CONTROL) LIQD Use to calibrate blood glucose meter.   cephALEXin (KEFLEX) 500 MG capsule Take 500 mg by mouth 3 (three) times daily.   clopidogrel (PLAVIX) 75 MG tablet  Take 1 tablet (75 mg total) by mouth daily.   Continuous Blood Gluc Sensor (FREESTYLE LIBRE 14 DAY SENSOR) MISC APPLY EVERY 14 DAYS   Dulaglutide (TRULICITY) 7.67 HA/1.9FX SOPN Inject into the skin once a week.   insulin glargine (LANTUS) 100 UNIT/ML injection Inject 60 Units into the skin 2 (two) times daily.   Insulin Pen Needle (BD PEN NEEDLE NANO U/F) 32G X 4 MM MISC 1 each by Does not apply route 3 (three) times daily. Use to inject insulin TID; E11.40   ketoconazole (NIZORAL) 2 % cream Apply 1 application topically daily as needed for irritation. TO AFFECTED AREAS OF FACE   memantine (NAMENDA) 10 MG tablet Take 1 tablet (10 mg total) by mouth 2 (two) times daily.   metoprolol succinate (TOPROL-XL) 50 MG 24 hr  tablet Take 1 tablet (50 mg total) by mouth daily. Take with or immediately following a meal.   nitroGLYCERIN (NITROSTAT) 0.4 MG SL tablet Place 0.4 mg under the tongue every 5 (five) minutes as needed for chest pain.    pantoprazole (PROTONIX) 40 MG tablet Take 1 tablet (40 mg total) by mouth daily.   psyllium (METAMUCIL) 58.6 % powder Take 1 packet by mouth daily as needed (fiber).    rosuvastatin (CRESTOR) 40 MG tablet Take 40 mg by mouth daily.   sacubitril-valsartan (ENTRESTO) 49-51 MG Take 1 tablet by mouth 2 (two) times daily.   spironolactone (ALDACTONE) 25 MG tablet Take 0.5 tablets (12.5 mg total) by mouth daily.   vitamin C (ASCORBIC ACID) 500 MG tablet Take 500 mg by mouth every other day.     Allergies:   Brilinta [ticagrelor], Statins, Tricor [fenofibrate], Latex, and Tape   Social History   Socioeconomic History   Marital status: Married    Spouse name: Not on file   Number of children: 2   Years of education: 12   Highest education level: High school graduate  Occupational History    Employer: RETIRED  Tobacco Use   Smoking status: Never   Smokeless tobacco: Never  Vaping Use   Vaping Use: Never used  Substance and Sexual Activity   Alcohol use: No     Alcohol/week: 0.0 standard drinks of alcohol   Drug use: No   Sexual activity: Not Currently  Other Topics Concern   Not on file  Social History Narrative   Lives at home with her husband.   Right-handed.   One cup caffeine per day.   Social Determinants of Health   Financial Resource Strain: Not on file  Food Insecurity: Not on file  Transportation Needs: Not on file  Physical Activity: Not on file  Stress: Not on file  Social Connections: Not on file     Family History:  The patient's  family history includes Diabetes in her father, mother, and another family member; Heart disease in her father and another family member.   ROS:   Please see the history of present illness.    ROS All other systems reviewed and are negative.   PHYSICAL EXAM:   VS:  BP 140/86   Pulse (!) 103   Ht '5\' 2"'$  (1.575 m)   Wt 193 lb 6.4 oz (87.7 kg)   SpO2 92%   BMI 35.37 kg/m   Physical Exam  GEN: Obese, in no acute distress  Neck: no JVD, carotid bruits, or masses Cardiac:RRR; no murmurs, rubs, or gallops  Respiratory:  clear to auscultation bilaterally, normal work of breathing GI: soft, nontender, nondistended, + BS Ext: right leg 2 open wounds that are bandaged. Left leg developing blisters. Distal pulses present Neuro:  Alert and Oriented x 3,  Psych: euthymic mood, full affect  Wt Readings from Last 3 Encounters:  03/24/22 193 lb 6.4 oz (87.7 kg)  03/31/21 186 lb (84.4 kg)  09/25/20 176 lb 3.2 oz (79.9 kg)      Studies/Labs Reviewed:   EKG:  EKG is  ordered today.  The ekg ordered today demonstrates V paced 103/m  Recent Labs: No results found for requested labs within last 365 days.   Lipid Panel    Component Value Date/Time   CHOL 168 01/11/2020 0332   TRIG 151 (H) 01/11/2020 0332   HDL 38 (L) 01/11/2020 0332   CHOLHDL 4.4 01/11/2020 0332   VLDL 30 01/11/2020 0332  LDLCALC 100 (H) 01/11/2020 0332   LDLDIRECT 155.1 06/05/2012 1340    Additional studies/ records  that were reviewed today include:    LHC 01/09/20: Mid RCA lesion is 100% stenosed. Ost RCA to Prox RCA lesion is 50% stenosed. Previously placed Prox Graft stent (unknown type) is widely patent. Mid Graft lesion is 65% stenosed. Origin lesion is 100% stenosed. LIMA and is normal in caliber. The graft exhibits no disease. Mid LAD to Dist LAD lesion is 80% stenosed. Origin to Prox Graft lesion is 95% stenosed. Post intervention, there is a 10% residual stenosis. Balloon angioplasty was performed using a BALLOON [place] EMERGE MR 3.0X20. Dist LM lesion is 80% stenosed. Ost Cx to Prox Cx lesion is 100% stenosed. Ost LAD to Prox LAD lesion is 100% stenosed.   1.  Severe underlying three-vessel coronary artery disease with occluded native vessels. 2.  Known chronically occluded SVG to right PDA.  LIMA to LAD is patent.  However, there is chronic diffuse disease in the mid and distal LAD.  SVG to diagonal is patent with no significant in-stent restenosis.  However, there is a borderline 60 to 70% lesion after the stent.  SVG to OM is patent with severe 95% in-stent restenosis proximally which is the likely culprit for myocardial infarction. 3.  Left ventricular angiography was not performed.  EF was 30 to 35% by echo.  Mildly elevated left ventricular end-diastolic pressure at 16 mmHg. 4.  Successful balloon angioplasty of SVG to OM for severe in-stent restenosis.   Recommendations: This was a very difficult and prolonged procedure from the left radial artery given inability to engage the SVG to OM.  There is significant tortuosity in the left subclavian artery.  I had to switch to a femoral approach. Avoid left radial catheterization in the future. Continue dual antiplatelet therapy indefinitely if possible. Aggressive treatment of risk factors. The lesion in the SVG to diagonal can likely be monitored for now.  PCI can be considered if she has residual angina.   TTE 01/09/20 IMPRESSIONS   1.  Left ventricular ejection fraction, by estimation, is 30 to 35%. The  left ventricle has moderately decreased function. The left ventricle has  no regional wall motion abnormalities. The left ventricular internal  cavity size was mildly dilated. Left  ventricular diastolic parameters are consistent with Grade II diastolic  dysfunction (pseudonormalization). Elevated left ventricular end-diastolic  pressure. There is akinesis of the left ventricular, entire inferior wall,  inferolateral wall and apical  segment.   2. Right ventricular systolic function is normal. The right ventricular  size is normal. There is normal pulmonary artery systolic pressure.   3. Left atrial size was mildly dilated.   4. The mitral valve is normal in structure. Mild mitral valve  regurgitation. No evidence of mitral stenosis.   5. The aortic valve is tricuspid. Aortic valve regurgitation is not  visualized. Mild aortic valve sclerosis is present, with no evidence of  aortic valve stenosis.   6. The inferior vena cava is normal in size with greater than 50%  respiratory variability, suggesting right atrial pressure of 3 mmHg.    LHC 10/28/17: Severe three vessel CAD. Prox LAD to Mid LAD lesion is 100% stenosed. LIMA to LAD is patent. 1st Diag-1 lesion is 100% stenosed. SVG to diagonal is patent with 80% stenosis. A drug-eluting stent was successfully placed using a STENT RESOLUTE ONYX 2.5X12 in the SVG to diagonal. Post intervention, there is a 0% residual stenosis. Dist LAD lesion  is 75% stenosed past LIMA insertion. 2nd Mrg lesion is 100% stenosed. Ost 1st Mrg to 1st Mrg lesion is 100% stenosed. SVG to OM is occluded. Post intervention, there is a 0% residual stenosis. A drug-eluting stent was successfully placed using a STENT SYNERGY DES 3X20. This appears to be a fresh occlusion and was successfully treated. Mid RCA to Dist RCA lesion is 100% stenosed. SVG to PDA is occluded, LIMA and is normal in  caliber. Ost Cx to Dist Cx lesion is 70% stenosed. The left ventricular ejection fraction is 45-50% by visual estimate. There is mild left ventricular systolic dysfunction. LV end diastolic pressure is moderately elevated. There is no aortic valve stenosis.   Successful two vessel PCI to the SVG to diagonal and SVG to OM, with DES.  Continue dual antiplatelet therapy for at least one year and possibly clopidogrel indefinitely going forward.  Finish current bag of angiomax.    Risk Assessment/Calculations:         ASSESSMENT:    1. Chronic systolic congestive heart failure ([place])   2. Coronary artery disease involving native coronary artery of native heart without angina pectoris   3. Paroxysmal atrial fibrillation (HCC)   4. Hypertensive heart disease with chronic systolic congestive heart failure ([place])   5. Hyperlipidemia, unspecified hyperlipidemia type   6. Type 2 diabetes mellitus with complication, with long-term current use of insulin ([place])   7. Cellulitis and abscess of right leg      PLAN:  In order of problems listed above:  Cellulitis with open wounds RLE not healing on keflex and daily dressing changes with adaptic given by urgent care. With DM2 will refer to wound clinic and have f/u with PCP. Check labs today. Will need vascular studies once wound heals.  Chronic systolic and diastolic heart failure: EF 30-35% echo 12/2019. NYHA class II symptoms. Appears euvolemic on exam with device clinic unable to check  ICM remote so she was disenrolled. Taking all medications as prescribed and tolerating them well.  -Continue metop '50mg'$  XL,entresto to 49/'51mg'$  BID, spironolactone 12.'5mg'$  daily -Check labs today -S/p CRT-D and followed by Dr. [name] with normal function-overdue for f/u   Severe multivessel CAD s/p CABG in 2000 with subsequent PCIs: Last cath 12/2019. LIMA-LAD (patent) with distal diffuse LAD disease; SVG-RCA known occluded; SVG to diag is s/p PCI with patent stent  but 60-70% stenosis after stent. SVG-OM with POBA for in-stent restenosis.  -NO FUTURE CATHS FROM LEFT RADIAL due to severe tortuosity of SCA -Continue ASA '81mg'$  and plavix '75mg'$  indefinitely -Continue crestor '40mg'$  daily   Paroxysmal Afib: CHADs-vasc 8. Not on AC due to low Afib burden. -Follows with Dr. [name] -CRT-D in place as above -Continue metop '50mg'$  XL   HTN: Elevated today at 140/80s but long walk in here and in pain. Will not adjust today.    HLD: -Continue crestor '40mg'$  daily -Check lipids at next visit-not fasting today   DMII: Poorly controlled. On insulin. Hasn't seen PCP in awhile-I've asked them to make an appt. No longer on jardiance.     Shared Decision Making/Informed Consent        Medication Adjustments/Labs and Tests Ordered: Current medicines are reviewed at length with the patient today.  Concerns regarding medicines are outlined above.  Medication changes, Labs and Tests ordered today are listed in the Patient Instructions below. Patient Instructions  Medication Instructions:  Your physician recommends that you continue on your current medications as directed. Please refer to the Current Medication  list given to you today.  *If you need a refill on your cardiac medications before your next appointment, please call your pharmacy*   Lab Work: TODAY: CMET, TSH, CBC  If you have labs (blood work) drawn today and your tests are completely normal, you will receive your results only by: [place] (if you have MyChart) OR A paper copy in the mail If you have any lab test that is abnormal or we need to change your treatment, we will call you to review the results.   Follow-Up: At [facility], you and your health needs are our priority.  As part of our continuing mission to provide you with exceptional heart care, we have created designated Provider Care Teams.  These Care Teams include your primary Cardiologist (physician) and Advanced Practice  Providers (APPs -  Physician Assistants and Nurse Practitioners) who all work together to provide you with the care you need, when you need it.  Your next appointment:   3-4 month(s)  The format for your next appointment:   In Person  Provider:   Freada Bergeron, MD {     Signed, Ermalinda Barrios, PA-C  03/24/2022 2:56 PM    [address], [place], [place]  [address] Phone: [phone]; Fax: [phone]

## 2022-03-24 NOTE — Patient Instructions (Signed)
Medication Instructions:  Your physician recommends that you continue on your current medications as directed. Please refer to the Current Medication list given to you today.  *If you need a refill on your cardiac medications before your next appointment, please call your pharmacy*   Lab Work: TODAY: CMET, TSH, CBC  If you have labs (blood work) drawn today and your tests are completely normal, you will receive your results only by: [place] (if you have MyChart) OR A paper copy in the mail If you have any lab test that is abnormal or we need to change your treatment, we will call you to review the results.   Follow-Up: At [facility], you and your health needs are our priority.  As part of our continuing mission to provide you with exceptional heart care, we have created designated Provider Care Teams.  These Care Teams include your primary Cardiologist (physician) and Advanced Practice Providers (APPs -  Physician Assistants and Nurse Practitioners) who all work together to provide you with the care you need, when you need it.  Your next appointment:   3-4 month(s)  The format for your next appointment:   In Person  Provider:   Freada Bergeron, MD {

## 2022-03-25 LAB — COMPREHENSIVE METABOLIC PANEL
ALT: 14 IU/L (ref 0–32)
AST: 18 IU/L (ref 0–40)
Albumin/Globulin Ratio: 1.5 (ref 1.2–2.2)
Albumin: 4 g/dL (ref 3.8–4.8)
Alkaline Phosphatase: 133 IU/L — ABNORMAL HIGH (ref 44–121)
BUN/Creatinine Ratio: 14 (ref 12–28)
BUN: 17 mg/dL (ref 8–27)
Bilirubin Total: 0.4 mg/dL (ref 0.0–1.2)
CO2: 20 mmol/L (ref 20–29)
Calcium: 9.5 mg/dL (ref 8.7–10.3)
Chloride: 103 mmol/L (ref 96–106)
Creatinine, Ser: 1.25 mg/dL — ABNORMAL HIGH (ref 0.57–1.00)
Globulin, Total: 2.7 g/dL (ref 1.5–4.5)
Glucose: 163 mg/dL — ABNORMAL HIGH (ref 70–99)
Potassium: 4.4 mmol/L (ref 3.5–5.2)
Sodium: 140 mmol/L (ref 134–144)
Total Protein: 6.7 g/dL (ref 6.0–8.5)
eGFR: 45 mL/min/{1.73_m2} — ABNORMAL LOW (ref >59)

## 2022-03-25 LAB — TSH: TSH: 1.54 u[IU]/mL (ref 0.450–4.500)

## 2022-03-25 LAB — CBC
Hematocrit: 37 % (ref 34.0–46.6)
Hemoglobin: 11.9 g/dL (ref 11.1–15.9)
MCH: 27.2 pg (ref 26.6–33.0)
MCHC: 32.2 g/dL (ref 31.5–35.7)
MCV: 85 fL (ref 79–97)
Platelets: 215 10*3/uL (ref 150–450)
RBC: 4.37 x10E6/uL (ref 3.77–5.28)
RDW: 16.6 % — ABNORMAL HIGH (ref 11.7–15.4)
WBC: 8 10*3/uL (ref 3.4–10.8)

## 2022-03-29 ENCOUNTER — Ambulatory Visit: Payer: Medicare HMO | Admitting: Cardiology

## 2022-04-01 ENCOUNTER — Encounter (HOSPITAL_BASED_OUTPATIENT_CLINIC_OR_DEPARTMENT_OTHER): Payer: Medicare HMO | Admitting: Internal Medicine

## 2022-04-09 NOTE — Progress Notes (Signed)
[place], [place] ([id number]) Visit Report for 04/12/2022 Allergy List Details Patient Name: Date of Service: PA [place], [place] 04/12/2022 1:15 PM Medical Record Number: [id number] Patient Account Number: [id number] Date of Birth/Sex: Treating RN: 07-07-1946 (76 y.o. Laurie [name] Primary Care Glessie Eustice: Domenick Gong Other Clinician: Referring Dawnyel Leven: Treating Leialoha Hanna/Extender: Yevonne Aline, MICHELE Weeks in Treatment: 0 Allergies Active Allergies Brilinta Statins-HMG-CoA Reductase Inhibitors Tricor latex adhesive tape Allergy Notes Electronic Signature(s) Signed: 04/12/2022 4:16:51 PM By: Lorrin Jackson Previous Signature: 04/09/2022 1:09:50 PM Version By: Lorrin Jackson Entered By: Lorrin Jackson on 04/12/2022 13:47:20 -------------------------------------------------------------------------------- Arrival Information Details Patient Name: Date of Service: PA [place], [place] [name]. 04/12/2022 1:15 PM Medical Record Number: [id number] Patient Account Number: [id number] Date of Birth/Sex: Treating RN: 04-19-1946 (76 y.o. Laurie [name] Primary Care Lizann Edelman: Domenick Gong Other Clinician: Referring Savva Beamer: Treating Evalena Fujii/Extender: Yevonne Aline, MICHELE Weeks in Treatment: 0 Visit Information Patient Arrived: Ambulatory Arrival Time: 13:40 Accompanied By: Daughter Transfer Assistance: None Patient Identification Verified: Yes Secondary Verification Process Completed: Yes Patient Requires Transmission-Based Precautions: No Patient Has Alerts: Yes Patient Alerts: Patient on Blood Thinner Electronic Signature(s) Signed: 04/12/2022 4:16:51 PM By: Lorrin Jackson Entered By: Lorrin Jackson on 04/12/2022 13:44:22 -------------------------------------------------------------------------------- Clinic Level of Care Assessment Details Patient Name: Date of Service: PA [place], [place] 04/12/2022 1:15 PM Medical Record Number: [id number] Patient  Account Number: [id number] Date of Birth/Sex: Treating RN: 07-03-1946 (76 y.o. Laurie [name] Primary Care Torrell Krutz: Domenick Gong Other Clinician: Referring Tashay Bozich: Treating Haskell Rihn/Extender: Yevonne Aline, MICHELE Weeks in Treatment: 0 Clinic Level of Care Assessment Items TOOL 2 Quantity Score X- 1 0 Use when only an EandM is performed on the INITIAL visit ASSESSMENTS - Nursing Assessment / Reassessment X- 1 20 General Physical Exam (combine w/ comprehensive assessment (listed just below) when performed on new pt. evals) X- 1 25 Comprehensive Assessment (HX, ROS, Risk Assessments, Wounds Hx, etc.) ASSESSMENTS - Wound and Skin A ssessment / Reassessment '[]'$  - 0 Simple Wound Assessment / Reassessment - one wound '[]'$  - 0 Complex Wound Assessment / Reassessment - multiple wounds '[]'$  - 0 Dermatologic / Skin Assessment (not related to wound area) ASSESSMENTS - Ostomy and/or Continence Assessment and Care '[]'$  - 0 Incontinence Assessment and Management '[]'$  - 0 Ostomy Care Assessment and Management (repouching, etc.) PROCESS - Coordination of Care '[]'$  - 0 Simple Patient / Family Education for ongoing care X- 1 20 Complex (extensive) Patient / Family Education for ongoing care X- 1 10 Staff obtains Programmer, systems, Records, T Results / Process Orders est '[]'$  - 0 Staff telephones HHA, Nursing Homes / Clarify orders / etc '[]'$  - 0 Routine Transfer to another Facility (non-emergent condition) '[]'$  - 0 Routine Hospital Admission (non-emergent condition) '[]'$  - 0 New Admissions / Biomedical engineer / Ordering NPWT Apligraf, etc. , '[]'$  - 0 Emergency Hospital Admission (emergent condition) '[]'$  - 0 Simple Discharge Coordination '[]'$  - 0 Complex (extensive) Discharge Coordination PROCESS - Special Needs '[]'$  - 0 Pediatric / Minor Patient Management '[]'$  - 0 Isolation Patient Management '[]'$  - 0 Hearing / Language / Visual special needs '[]'$  - 0 Assessment of Community assistance  (transportation, D/C planning, etc.) '[]'$  - 0 Additional assistance / Altered mentation '[]'$  - 0 Support Surface(s) Assessment (bed, cushion, seat, etc.) INTERVENTIONS - Wound Cleansing / Measurement '[]'$  - 0 Wound Imaging (photographs - any number of wounds) '[]'$  - 0 Wound Tracing (instead of photographs) '[]'$  - 0 Simple Wound Measurement - one wound '[]'$  - 0 Complex  Wound Measurement - multiple wounds '[]'$  - 0 Simple Wound Cleansing - one wound '[]'$  - 0 Complex Wound Cleansing - multiple wounds INTERVENTIONS - Wound Dressings '[]'$  - 0 Small Wound Dressing one or multiple wounds '[]'$  - 0 Medium Wound Dressing one or multiple wounds '[]'$  - 0 Large Wound Dressing one or multiple wounds '[]'$  - 0 Application of Medications - injection INTERVENTIONS - Miscellaneous '[]'$  - 0 External ear exam '[]'$  - 0 Specimen Collection (cultures, biopsies, blood, body fluids, etc.) '[]'$  - 0 Specimen(s) / Culture(s) sent or taken to Lab for analysis '[]'$  - 0 Patient Transfer (multiple staff / Civil Service fast streamer / Similar devices) '[]'$  - 0 Simple Staple / Suture removal (25 or less) '[]'$  - 0 Complex Staple / Suture removal (26 or more) '[]'$  - 0 Hypo / Hyperglycemic Management (close monitor of Blood Glucose) X- 1 15 Ankle / Brachial Index (ABI) - do not check if billed separately Has the patient been seen at the hospital within the last three years: Yes Total Score: 90 Level Of Care: New/Established - Level 3 Electronic Signature(s) Signed: 04/12/2022 4:16:51 PM By: Lorrin Jackson Entered By: Lorrin Jackson on 04/12/2022 14:28:31 -------------------------------------------------------------------------------- Encounter Discharge Information Details Patient Name: Date of Service: [name], PA [name]. 04/12/2022 1:15 PM Medical Record Number: [id number] Patient Account Number: [id number] Date of Birth/Sex: Treating RN: 07/14/1946 (76 y.o. Laurie [name] Primary Care Thedford Bunton: Domenick Gong Other Clinician: Referring  Comfort Iversen: Treating Journi Moffa/Extender: Yevonne Aline, MICHELE Weeks in Treatment: 0 Encounter Discharge Information Items Discharge Condition: Stable Ambulatory Status: Cane Discharge Destination: Home Transportation: Private Auto Accompanied By: Daughter Schedule Follow-up Appointment: No Clinical Summary of Care: Provided on 04/12/2022 Form Type Recipient Paper Patient Patient Electronic Signature(s) Signed: 04/12/2022 4:16:51 PM By: Lorrin Jackson Entered By: Lorrin Jackson on 04/12/2022 14:34:06 -------------------------------------------------------------------------------- Lower Extremity Assessment Details Patient Name: Date of Service: PA [place], [place] [name]. 04/12/2022 1:15 PM Medical Record Number: [id number] Patient Account Number: [id number] Date of Birth/Sex: Treating RN: 1945-12-12 (76 y.o. Laurie [name] Primary Care Hillman Attig: Domenick Gong Other Clinician: Referring Juliona Vales: Treating Becker Christopher/Extender: Yevonne Aline, MICHELE Weeks in Treatment: 0 Edema Assessment Assessed: [Left: Yes] [Right: Yes] Edema: [Left: Yes] [Right: Yes] Calf Left: Right: Point of Measurement: 29 cm From Medial Instep 38.3 cm 38.5 cm Ankle Left: Right: Point of Measurement: 3 cm From Medial Instep 21.6 cm 22.5 cm Knee To Floor Left: Right: From Medial Instep 36 cm 36 cm Vascular Assessment Pulses: Dorsalis Pedis Palpable: [Left:Yes] [Right:Yes] Doppler Audible: [Left:Yes] [Right:Yes] Blood Pressure: Brachial: [Left:125] [Right:125] Ankle: [Left:Dorsalis Pedis: 130 1.04] [Right:Dorsalis Pedis: 140 1.12] Electronic Signature(s) Signed: 04/12/2022 4:16:51 PM By: Lorrin Jackson Entered By: Lorrin Jackson on 04/12/2022 14:07:43 -------------------------------------------------------------------------------- Multi Wound Chart Details Patient Name: Date of Service: [name], PA [name]. 04/12/2022 1:15 PM Medical Record Number: [id number] Patient Account  Number: [id number] Date of Birth/Sex: Treating RN: 1946-08-21 (76 y.o. F) Primary Care Madaleine Simmon: Domenick Gong Other Clinician: Referring Mellonie Guess: Treating Clarke Amburn/Extender: Yevonne Aline, MICHELE Weeks in Treatment: 0 Vital Signs Height(in): 63 Capillary Blood Glucose(mg/dl): 104 Weight(lbs): 190 Pulse(bpm): 99 Body Mass Index(BMI): 33.7 Blood Pressure(mmHg): 125/77 Temperature(F): 98.6 Respiratory Rate(breaths/min): 18 Wound Assessments Treatment Notes Electronic Signature(s) Signed: 04/13/2022 8:48:16 AM By: Kalman Shan DO Entered By: Kalman Shan on 04/12/2022 14:49:33 -------------------------------------------------------------------------------- Multi-Disciplinary Care Plan Details Patient Name: Date of Service: PA [place], [place] [name]. 04/12/2022 1:15 PM Medical Record Number: [id number] Patient Account Number: [id number] Date of Birth/Sex: Treating RN: [birth year] (76 y.o. Laurie [name] Primary Care  Martavis Gurney: Domenick Gong Other Clinician: Referring Luan Urbani: Treating Ronnae Kaser/Extender: Yevonne Aline, MICHELE Weeks in Treatment: 0 Active Inactive Electronic Signature(s) Signed: 04/12/2022 4:16:51 PM By: Lorrin Jackson Entered By: Lorrin Jackson on 04/12/2022 14:27:09 -------------------------------------------------------------------------------- Pain Assessment Details Patient Name: Date of Service: PA [place], [place] [name]. 04/12/2022 1:15 PM Medical Record Number: [id number] Patient Account Number: [id number] Date of Birth/Sex: Treating RN: 04-08-1946 (76 y.o. Laurie [name] Primary Care Delayla Hoffmaster: Domenick Gong Other Clinician: Referring Hall Birchard: Treating Eun Vermeer/Extender: Yevonne Aline, MICHELE Weeks in Treatment: 0 Active Problems Location of Pain Severity and Description of Pain Patient Has Paino No Site Locations Pain Management and Medication Current Pain Management: Electronic Signature(s) Signed:  04/12/2022 4:16:51 PM By: Lorrin Jackson Entered By: Lorrin Jackson on 04/12/2022 14:33:24 -------------------------------------------------------------------------------- Patient/Caregiver Education Details Patient Name: Date of Service: [name], PA Laurie [name] 7/31/2023andnbsp1:15 PM Medical Record Number: [id number] Patient Account Number: [id number] Date of Birth/Gender: Treating RN: 17-Jan-1946 (76 y.o. Laurie [name] Primary Care Physician: Domenick Gong Other Clinician: Referring Physician: Treating Physician/Extender: Lyndee Leo Weeks in Treatment: 0 Education Assessment Education Provided To: Patient and Caregiver Education Topics Provided Venous: Methods: Explain/Verbal, Printed Responses: State content correctly Electronic Signature(s) Signed: 04/12/2022 4:16:51 PM By: Lorrin Jackson Entered By: Lorrin Jackson on 04/12/2022 14:27:26 -------------------------------------------------------------------------------- Vitals Details Patient Name: Date of Service: [name], [place] [name]. 04/12/2022 1:15 PM Medical Record Number: [id number] Patient Account Number: [id number] Date of Birth/Sex: Treating RN: Jul 15, 1946 (76 y.o. Laurie [name] Primary Care Malayjah Otoole: Domenick Gong Other Clinician: Referring Deshawna Mcneece: Treating Rivka Baune/Extender: Yevonne Aline, MICHELE Weeks in Treatment: 0 Vital Signs Time Taken: 13:44 Temperature (F): 98.6 Height (in): 63 Pulse (bpm): 99 Source: Stated Respiratory Rate (breaths/min): 18 Weight (lbs): 190 Blood Pressure (mmHg): 125/77 Source: Stated Capillary Blood Glucose (mg/dl): 104 Body Mass Index (BMI): 33.7 Reference Range: 80 - 120 mg / dl Electronic Signature(s) Signed: 04/12/2022 4:16:51 PM By: Lorrin Jackson Entered By: Lorrin Jackson on 04/12/2022 13:47:05

## 2022-04-12 ENCOUNTER — Encounter (HOSPITAL_BASED_OUTPATIENT_CLINIC_OR_DEPARTMENT_OTHER): Payer: Medicare HMO | Attending: Internal Medicine | Admitting: Internal Medicine

## 2022-04-12 DIAGNOSIS — I5022 Chronic systolic (congestive) heart failure: Secondary | ICD-10-CM | POA: Diagnosis not present

## 2022-04-12 DIAGNOSIS — I110 Hypertensive heart disease with heart failure: Secondary | ICD-10-CM | POA: Insufficient documentation

## 2022-04-12 DIAGNOSIS — I872 Venous insufficiency (chronic) (peripheral): Secondary | ICD-10-CM | POA: Diagnosis not present

## 2022-04-12 DIAGNOSIS — F0390 Unspecified dementia without behavioral disturbance: Secondary | ICD-10-CM | POA: Insufficient documentation

## 2022-04-12 DIAGNOSIS — Z872 Personal history of diseases of the skin and subcutaneous tissue: Secondary | ICD-10-CM | POA: Diagnosis not present

## 2022-04-12 DIAGNOSIS — Z951 Presence of aortocoronary bypass graft: Secondary | ICD-10-CM | POA: Diagnosis not present

## 2022-04-12 DIAGNOSIS — E1140 Type 2 diabetes mellitus with diabetic neuropathy, unspecified: Secondary | ICD-10-CM | POA: Insufficient documentation

## 2022-04-12 NOTE — Progress Notes (Signed)
Laurie [name], Laurie [name] ([id number]) Visit Report for 04/12/2022 Abuse Risk Screen Details Patient Name: Date of Service: PA [place], [place] 04/12/2022 1:15 PM Medical Record Number: [id number] Patient Account Number: [id number] Date of Birth/Sex: Treating RN: [birth year] (76 y.o. Laurie [name] Primary Care Laurie [name]: Domenick Gong Other Clinician: Referring Stacy Deshler: Treating Misty Foutz/Extender: Laurie [name], Laurie [name] in Treatment: 0 Abuse Risk Screen Items Answer ABUSE RISK SCREEN: Has anyone close to you tried to hurt or harm you recentlyo No Do you feel uncomfortable with anyone in your familyo No Has anyone forced you do things that you didnt want to doo No Electronic Signature(s) Signed: 04/12/2022 4:16:51 PM By: Lorrin Jackson Entered By: Lorrin Jackson on 04/12/2022 13:50:59 -------------------------------------------------------------------------------- Activities of Daily Living Details Patient Name: Date of Service: PA [place], [place] 04/12/2022 1:15 PM Medical Record Number: [id number] Patient Account Number: [id number] Date of Birth/Sex: Treating RN: Aug 19, 1946 (76 y.o. Laurie [name] Primary Care Laurie [name]: Domenick Gong Other Clinician: Referring Laurie [name]: Treating Laurie [name]/Extender: Laurie [name], Laurie [name] in Treatment: 0 Activities of Daily Living Items Answer Activities of Daily Living (Please select one for each item) Drive Automobile Not Able T Medications ake Need Assistance Use T elephone Completely Able Care for Appearance Need Assistance Use T oilet Completely Able Bath / Shower Need Assistance Dress Self Need Assistance Feed Self Completely Able Walk Need Assistance Get In / Out Bed Completely [place] Need Assistance Shop for Self Need Assistance Electronic Signature(s) Signed: 04/12/2022 4:16:51 PM By: Lorrin Jackson Entered By: Lorrin Jackson on  04/12/2022 13:53:15 -------------------------------------------------------------------------------- Education Screening Details Patient Name: Date of Service: [name], [name] 04/12/2022 1:15 PM Medical Record Number: [id number] Patient Account Number: [id number] Date of Birth/Sex: Treating RN: 08-Sep-1946 (76 y.o. Laurie [name] Primary Care Laurie [name]: Domenick Gong Other Clinician: Referring Laurie [name]: Treating Laurie [name]/Extender: Laurie [name] [name] in Treatment: 0 Primary Learner Assessed: Patient Learning Preferences/Education Level/Primary Language Learning Preference: Explanation, Demonstration, Printed Material Highest Education Level: High School Preferred Language: English Cognitive Barrier Language Barrier: No Translator Needed: No Memory Deficit: No Emotional Barrier: No Cultural/Religious Beliefs Affecting Medical Care: No Physical Barrier Impaired Vision: Yes Glasses Impaired Hearing: No Decreased Hand dexterity: No Knowledge/Comprehension Knowledge Level: Medium Comprehension Level: Medium Ability to understand written instructions: Medium Ability to understand verbal instructions: Medium Motivation Anxiety Level: Calm Cooperation: Cooperative Education Importance: Acknowledges Need Interest in Health Problems: Asks Questions Perception: Coherent Willingness to Engage in Self-Management High Activities: Readiness to Engage in Self-Management High Activities: Electronic Signature(s) Signed: 04/12/2022 4:16:51 PM By: Lorrin Jackson Entered By: Lorrin Jackson on 04/12/2022 13:52:38 -------------------------------------------------------------------------------- Fall Risk Assessment Details Patient Name: Date of Service: [name], [name] 04/12/2022 1:15 PM Medical Record Number: [id number] Patient Account Number: [id number] Date of Birth/Sex: Treating RN: March 05, 1946 (76 y.o. Laurie [name] Primary Care Bren Steers: Domenick Gong Other Clinician: Referring Laurie [name]: Treating Laurie [name]/Extender: Laurie [name], Laurie [name] in Treatment: 0 Fall Risk Assessment Items Have you had 2 or more falls in the last 12 monthso 0 No Have you had any fall that resulted in injury in the last 12 monthso 0 No FALLS RISK SCREEN History of falling - immediate or within 3 months 0 No Secondary diagnosis (Do you have 2 or more medical diagnoseso) 15 Yes Ambulatory aid None/bed rest/wheelchair/nurse 0 No Crutches/cane/walker 15 Yes Furniture 0 No Intravenous therapy Access/Saline/Heparin Lock 0 No Gait/Transferring Normal/ bed rest/ wheelchair 0 Yes Weak (short steps with or  without shuffle, stooped but able to lift head while walking, may seek 0 No support from furniture) Impaired (short steps with shuffle, may have difficulty arising from chair, head down, impaired 0 No balance) Mental Status Oriented to own ability 0 Yes Electronic Signature(s) Signed: 04/12/2022 4:16:51 PM By: Lorrin Jackson Entered By: Lorrin Jackson on 04/12/2022 13:53:05 -------------------------------------------------------------------------------- Foot Assessment Details Patient Name: Date of Service: [name], [name] 04/12/2022 1:15 PM Medical Record Number: [id number] Patient Account Number: [id number] Date of Birth/Sex: Treating RN: 05-Jun-1946 (76 y.o. Laurie [name] Primary Care Laurie [name]: Domenick Gong Other Clinician: Referring Laurie [name]: Treating Laurie [name]/Extender: Laurie [name], Laurie [name] in Treatment: 0 Foot Assessment Items Site Locations + = Sensation present, - = Sensation absent, C = Callus, U = Ulcer R = Redness, W = Warmth, M = Maceration, PU = Pre-ulcerative lesion F = Fissure, S = Swelling, D = Dryness Assessment Right: Left: Other Deformity: No No Prior Foot Ulcer: No No Prior Amputation: No No Charcot Joint: No No Ambulatory Status: Ambulatory With Help Assistance Device:  Cane Gait: Steady Electronic Signature(s) Signed: 04/12/2022 4:16:51 PM By: Lorrin Jackson Entered By: Lorrin Jackson on 04/12/2022 13:59:16 -------------------------------------------------------------------------------- Nutrition Risk Screening Details Patient Name: Date of Service: PA [place], [place] 04/12/2022 1:15 PM Medical Record Number: [id number] Patient Account Number: [id number] Date of Birth/Sex: Treating RN: [birth year] (76 y.o. Laurie [name] Primary Care Sharronda Schweers: Domenick Gong Other Clinician: Referring Nayshawn Mesta: Treating Anju Sereno/Extender: Laurie [name], Laurie [name] in Treatment: 0 Height (in): 63 Weight (lbs): 190 Body Mass Index (BMI): 33.7 Nutrition Risk Screening Items Score Screening NUTRITION RISK SCREEN: I have an illness or condition that made me change the kind and/or amount of food I eat 0 No I eat fewer than two meals per day 0 No I eat few fruits and vegetables, or milk products 0 No I have three or more drinks of beer, liquor or wine almost every day 0 No I have tooth or mouth problems that make it hard for me to eat 0 No I don't always have enough money to buy the food I need 0 No I eat alone most of the time 0 No I take three or more different prescribed or over-the-counter drugs a day 1 Yes Without wanting to, I have lost or gained 10 pounds in the last six months 0 No I am not always physically able to shop, cook and/or feed myself 0 No Nutrition Protocols Good Risk Protocol 0 No interventions needed Moderate Risk Protocol High Risk Proctocol Risk Level: Good Risk Score: 1 Electronic Signature(s) Signed: 04/12/2022 4:16:51 PM By: Lorrin Jackson Entered By: Lorrin Jackson on 04/12/2022 13:53:30

## 2022-04-13 NOTE — Progress Notes (Signed)
Vegh, Trimble ([id number]) Visit Report for 04/12/2022 Chief Complaint Document Details Patient Name: Date of Service: PA [place], [place] 04/12/2022 1:15 PM Medical Record Number: [id number] Patient Account Number: [id number] Date of Birth/Sex: Treating RN: [birth year] (76 y.o. F) Primary Care Provider: Domenick Gong Other Clinician: Referring Provider: Treating Provider/Extender: Yevonne Aline, MICHELE Weeks in Treatment: 0 Information Obtained from: Patient Chief Complaint 04/12/2022; history of right lower extremity wound Electronic Signature(s) Signed: 04/13/2022 8:48:16 AM By: Kalman Shan DO Entered By: Kalman Shan on 04/12/2022 14:49:41 -------------------------------------------------------------------------------- HPI Details Patient Name: Date of Service: [name], [place] [name]. 04/12/2022 1:15 PM Medical Record Number: [id number] Patient Account Number: [id number] Date of Birth/Sex: Treating RN: 12-29-1945 (76 y.o. F) Primary Care Provider: Domenick Gong Other Clinician: Referring Provider: Treating Provider/Extender: Yevonne Aline, MICHELE Weeks in Treatment: 0 History of Present Illness HPI Description: Admission 04/12/2022 [name] is a 76 year old female with a past medical history of insulin-dependent type 2 diabetes complicated by peripheral neuropathy, chronic systolic heart failure, and CAD s/p CABG that presents to the clinic for a history Right lower extremity wound. Daughter is present during the encounter. She has pictures of The patient's previous wounds to the right lower extremity. It started out as a blister. Patient saw urgent care on 03/15/2022 for this issue. She was started on Keflex and recommended daily dressing changes with Adaptic. She states the wound has healed. She is on a diuretic. She has compression stockings at home but does not use these. Electronic Signature(s) Signed: 04/13/2022 8:48:16 AM By: Kalman Shan DO Entered By: Kalman Shan on 04/12/2022 14:50:54 -------------------------------------------------------------------------------- Physical Exam Details Patient Name: Date of Service: [name], [place] [name]. 04/12/2022 1:15 PM Medical Record Number: [id number] Patient Account Number: [id number] Date of Birth/Sex: Treating RN: 05-Dec-1945 (76 y.o. F) Primary Care Provider: Domenick Gong Other Clinician: Referring Provider: Treating Provider/Extender: Yevonne Aline, MICHELE Weeks in Treatment: 0 Constitutional respirations regular, non-labored and within target range for patient.. Cardiovascular 2+ dorsalis pedis/posterior tibialis pulses. Psychiatric pleasant and cooperative. Notes Right lower extremity: Epithelization to the previous wound site. 2+ pitting edema to the knees bilaterally Electronic Signature(s) Signed: 04/13/2022 8:48:16 AM By: Kalman Shan DO Entered By: Kalman Shan on 04/12/2022 14:51:36 -------------------------------------------------------------------------------- Physician Orders Details Patient Name: Date of Service: [name], PA [name]. 04/12/2022 1:15 PM Medical Record Number: [id number] Patient Account Number: [id number] Date of Birth/Sex: Treating RN: 02-25-1946 (76 y.o. Laurie [name] Primary Care Provider: Domenick Gong Other Clinician: Referring Provider: Treating Provider/Extender: Yevonne Aline, MICHELE Weeks in Treatment: 0 Verbal / Phone Orders: No Diagnosis Coding ICD-10 Coding Code Description E11.40 Type 2 diabetes mellitus with diabetic neuropathy, unspecified Z95.1 Presence of aortocoronary bypass graft T61.44 Chronic systolic (congestive) heart failure F03.90 Unspecified dementia, unspecified severity, without behavioral disturbance, psychotic disturbance, mood disturbance, and anxiety I87.2 Venous insufficiency (chronic) (peripheral) Follow-up Appointments Other: - No follow up needed,  consult only. Edema Control - Lymphedema / SCD / Other Elevate legs to the level of the heart or above for 30 minutes daily and/or when sitting, a frequency of: - throughout the day. Do not sit with legs down. Patient to wear own compression stockings every day. - Put on first thing in the morning and remove at bedtime. Moisturize legs daily. Compression stocking or Garment 20-30 mm/Hg pressure to: - Info given for Elastic Therapy Electronic Signature(s) Signed: 04/13/2022 8:48:16 AM By: Kalman Shan DO Entered By: Kalman Shan on 04/12/2022 14:51:44 -------------------------------------------------------------------------------- Problem [name] Details Patient Name:  Date of Service: PA [place], [place] 04/12/2022 1:15 PM Medical Record Number: [id number] Patient Account Number: [id number] Date of Birth/Sex: Treating RN: 1946-08-05 (76 y.o. F) Primary Care Provider: Domenick Gong Other Clinician: Referring Provider: Treating Provider/Extender: Yevonne Aline, MICHELE Weeks in Treatment: 0 Active Problems ICD-10 Encounter Code Description Active Date MDM Diagnosis E11.40 Type 2 diabetes mellitus with diabetic neuropathy, unspecified 04/12/2022 No Yes S85.46 Chronic systolic (congestive) heart failure 04/12/2022 No Yes I87.2 Venous insufficiency (chronic) (peripheral) 04/12/2022 No Yes Z95.1 Presence of aortocoronary bypass graft 04/12/2022 No Yes F03.90 Unspecified dementia, unspecified severity, without behavioral disturbance, 04/12/2022 No Yes psychotic disturbance, mood disturbance, and anxiety Inactive Problems Resolved Problems Electronic Signature(s) Signed: 04/13/2022 8:48:16 AM By: Kalman Shan DO Entered By: Kalman Shan on 04/12/2022 14:49:27 -------------------------------------------------------------------------------- Progress Note Details Patient Name: Date of Service: [name], [place] [name]. 04/12/2022 1:15 PM Medical Record Number: [id number] Patient  Account Number: [id number] Date of Birth/Sex: Treating RN: Dec 07, 1945 (76 y.o. F) Primary Care Provider: Domenick Gong Other Clinician: Referring Provider: Treating Provider/Extender: Yevonne Aline, MICHELE Weeks in Treatment: 0 Subjective Chief Complaint Information obtained from Patient 04/12/2022; history of right lower extremity wound History of Present Illness (HPI) Admission 04/12/2022 [name] is a 76 year old female with a past medical history of insulin-dependent type 2 diabetes complicated by peripheral neuropathy, chronic systolic heart failure, and CAD s/p CABG that presents to the clinic for a history Right lower extremity wound. Daughter is present during the encounter. She has pictures of The patient's previous wounds to the right lower extremity. It started out as a blister. Patient saw urgent care on 03/15/2022 for this issue. She was started on Keflex and recommended daily dressing changes with Adaptic. She states the wound has healed. She is on a diuretic. She has compression stockings at home but does not use these. Patient History Information obtained from Patient, Chart. Allergies Brilinta, Statins-HMG-CoA Reductase Inhibitors, Tricor, latex, adhesive tape Family History Diabetes - Mother,Father,Child, Heart Disease - Father, Hypertension - Father, Seizures - Child, No family history of Cancer, Hereditary Spherocytosis, Kidney Disease, Lung Disease, Stroke, Thyroid Problems, Tuberculosis. Social History Never smoker, Marital Status - Married, Alcohol Use - Never, Drug Use - No History, Caffeine Use - Daily. Medical History Cardiovascular Patient has history of Arrhythmia, Congestive Heart Failure, Coronary Artery Disease, Hypertension Endocrine Patient has history of Type II Diabetes Musculoskeletal Patient has history of Osteoarthritis Neurologic Patient has history of Dementia, Neuropathy Patient is treated with Insulin. Blood sugar is  tested. Medical A Surgical History Notes nd Cardiovascular Hx of CVA Review of Systems (ROS) Eyes Complains or has symptoms of Glasses / Contacts. Ear/Nose/Mouth/Throat Denies complaints or symptoms of Chronic sinus problems or rhinitis. Respiratory Denies complaints or symptoms of Chronic or frequent coughs, Shortness of Breath. Gastrointestinal Denies complaints or symptoms of Frequent diarrhea, Nausea, Vomiting. Genitourinary Denies complaints or symptoms of Frequent urination. Integumentary (Skin) Complains or has symptoms of Wounds. Objective Constitutional respirations regular, non-labored and within target range for patient.. Vitals Time Taken: 1:44 PM, Height: 63 in, Source: Stated, Weight: 190 lbs, Source: Stated, BMI: 33.7, Temperature: 98.6 F, Pulse: 99 bpm, Respiratory Rate: 18 breaths/min, Blood Pressure: 125/77 mmHg, Capillary Blood Glucose: 104 mg/dl. Cardiovascular 2+ dorsalis pedis/posterior tibialis pulses. Psychiatric pleasant and cooperative. General Notes: Right lower extremity: Epithelization to the previous wound site. 2+ pitting edema to the knees bilaterally Assessment Active Problems ICD-10 Type 2 diabetes mellitus with diabetic neuropathy, unspecified Chronic systolic (congestive) heart failure Venous insufficiency (chronic) (peripheral) Presence  of aortocoronary bypass graft Unspecified dementia, unspecified severity, without behavioral disturbance, psychotic disturbance, mood disturbance, and anxiety Patient has a history of right lower extremity wound that has been healed with daily dressing changes of Adaptic and a course of Keflex. She has evidence of venous insufficiency on exam. Her blisters are contributed by this and exacerbated by her chronic systolic heart failure. I recommended that she elevate her legs and use compression stockings daily. Her ABIs were 1.04 on the left and 1.12 on the right. She would benefit from 20-30 mmHg  compression stockings. We gave her leg measurements and information to order these. She may follow-up as needed. 46 minutes was spent on the encounter including face-to-face, EMR review and coordination of care Plan Follow-up Appointments: Other: - No follow up needed, consult only. Edema Control - Lymphedema / SCD / Other: Elevate legs to the level of the heart or above for 30 minutes daily and/or when sitting, a frequency of: - throughout the day. Do not sit with legs down. Patient to wear own compression stockings every day. - Put on first thing in the morning and remove at bedtime. Moisturize legs daily. Compression stocking or Garment 20-30 mm/Hg pressure to: - Info given for Elastic Therapy 1. Compression stockings daily 2. Follow-up as needed Electronic Signature(s) Signed: 04/13/2022 8:48:16 AM By: Kalman Shan DO Entered By: Kalman Shan on 04/12/2022 14:54:32 -------------------------------------------------------------------------------- HxROS Details Patient Name: Date of Service: [name], PA [name]. 04/12/2022 1:15 PM Medical Record Number: [id number] Patient Account Number: [id number] Date of Birth/Sex: Treating RN: Feb 28, 1946 (76 y.o. Laurie [name] Primary Care Provider: Domenick Gong Other Clinician: Referring Provider: Treating Provider/Extender: Yevonne Aline, MICHELE Weeks in Treatment: 0 Information Obtained From Patient Chart Eyes Complaints and Symptoms: Positive for: Glasses / Contacts Ear/Nose/Mouth/Throat Complaints and Symptoms: Negative for: Chronic sinus problems or rhinitis Respiratory Complaints and Symptoms: Negative for: Chronic or frequent coughs; Shortness of Breath Gastrointestinal Complaints and Symptoms: Negative for: Frequent diarrhea; Nausea; Vomiting Genitourinary Complaints and Symptoms: Negative for: Frequent urination Integumentary (Skin) Complaints and Symptoms: Positive for:  Wounds Hematologic/Lymphatic Cardiovascular Medical History: Positive for: Arrhythmia; Congestive Heart Failure; Coronary Artery Disease; Hypertension Past Medical History Notes: Hx of CVA Endocrine Medical History: Positive for: Type II Diabetes Time with diabetes: 30+ Treated with: Insulin Blood sugar tested every day: Yes Tested : 2x day Immunological Musculoskeletal Medical History: Positive for: Osteoarthritis Neurologic Medical History: Positive for: Dementia; Neuropathy Oncologic Immunizations Pneumococcal Vaccine: Received Pneumococcal Vaccination: No Implantable Devices Yes Family and Social History Cancer: No; Diabetes: Yes - Mother,Father,Child; Heart Disease: Yes - Father; Hereditary Spherocytosis: No; Hypertension: Yes - Father; Kidney Disease: No; Lung Disease: No; Seizures: Yes - Child; Stroke: No; Thyroid Problems: No; Tuberculosis: No; Never smoker; Marital Status - Married; Alcohol Use: Never; Drug Use: No History; Caffeine Use: Daily; Financial Concerns: No; Food, Clothing or Shelter Needs: No; Support System Lacking: No; Transportation Concerns: No Electronic Signature(s) Signed: 04/12/2022 4:16:51 PM By: Lorrin Jackson Signed: 04/13/2022 8:48:16 AM By: Kalman Shan DO Previous Signature: 04/12/2022 9:29:49 AM Version By: Kalman Shan DO Entered By: Lorrin Jackson on 04/12/2022 13:50:47 -------------------------------------------------------------------------------- SuperBill Details Patient Name: Date of Service: PA [place], [place] [name]. 04/12/2022 Medical Record Number: [id number] Patient Account Number: [id number] Date of Birth/Sex: Treating RN: 1946/05/27 (76 y.o. Laurie [name] Primary Care Provider: Domenick Gong Other Clinician: Referring Provider: Treating Provider/Extender: Yevonne Aline, MICHELE Weeks in Treatment: 0 Diagnosis Coding ICD-10 Codes Code Description E11.40 Type 2 diabetes mellitus with diabetic neuropathy,  unspecified  Z95.1 Presence of aortocoronary bypass graft P79.21 Chronic systolic (congestive) heart failure F03.90 Unspecified dementia, unspecified severity, without behavioral disturbance, psychotic disturbance, mood disturbance, and anxiety I87.2 Venous insufficiency (chronic) (peripheral) Facility Procedures CPT4 Code: [id number] Description: 99213 - WOUND CARE VISIT-LEV 3 EST PT Modifier: 25 Quantity: 1 Physician Procedures : CPT4 Code Description Modifier [id number] - WC PHYS LEVEL 4 - NEW PT ICD-10 Diagnosis Description E11.40 Type 2 diabetes mellitus with diabetic neuropathy, unspecified Z95.1 Presence of aortocoronary bypass graft G81.66 Chronic systolic (congestive)  heart failure I87.2 Venous insufficiency (chronic) (peripheral) Quantity: 1 Electronic Signature(s) Signed: 04/13/2022 8:48:16 AM By: Kalman Shan DO Entered By: Kalman Shan on 04/12/2022 14:54:37

## 2022-04-26 ENCOUNTER — Encounter: Payer: Medicare HMO | Admitting: Internal Medicine

## 2022-04-26 DIAGNOSIS — I429 Cardiomyopathy, unspecified: Secondary | ICD-10-CM

## 2022-04-26 DIAGNOSIS — Z95810 Presence of automatic (implantable) cardiac defibrillator: Secondary | ICD-10-CM

## 2022-04-26 DIAGNOSIS — I5022 Chronic systolic (congestive) heart failure: Secondary | ICD-10-CM

## 2022-04-26 DIAGNOSIS — I480 Paroxysmal atrial fibrillation: Secondary | ICD-10-CM

## 2022-05-05 NOTE — Progress Notes (Deleted)
Electrophysiology Office Note Date: 05/05/2022  ID:  [name], Flight 1945-12-13, MRN [id number]  PCP: Haywood Pao, MD Primary Cardiologist: Freada Bergeron, MD Electrophysiologist: Virl Axe, MD   CC: Routine ICD follow-up  Laurie [name] is a 76 y.o. female seen today for Virl Axe, MD for routine electrophysiology followup.  Since last being seen in our clinic the patient reports doing ***.  she denies chest pain, palpitations, dyspnea, PND, orthopnea, nausea, vomiting, dizziness, syncope, edema, weight gain, or early satiety. {He/she (caps):30048} has not had ICD shocks.   Device History: SJM CRT-D 11/05/2015 (RV/LV leads 2017), RA lead 2014 Has both MDT and SJM leads He has an abandoned RV pacing lead CAN NOT have MRIs  Past Medical History:  Diagnosis Date   AICD (automatic cardioverter/defibrillator) present    Bell's palsy 01/05/2010   CAD (coronary artery disease)    Cath  07/21/1999  mild ostial, 80% stenosis proximal LAD, 90% stenosis proximal Diag 1, 95% stenosis proximal OM 1, 80% stenosis proximal RCA, 95% stenosis mid RCA    CABG 07/22/99  LIMA to LAD, SVG to dx, SVG to OM, SVG to PDA  Dr. [name]   Cardiolite 2012 no ischemia EF 67%  Cath 10/23/12 Severe native three-vessel coronary artery disease with occlusion of all 3 native coronary arteries, Patent sap   Chronic lower back pain    Chronic systolic CHF (congestive heart failure) ([place]) 10/22/2015   Cystitis    Gait abnormality    Hepatitis 1970s   "don't know which" (03/17/2017)   HIstory of Bell's palsy    October 2012    Hyperlipidemia    Intolerance to several statins    Hypertensive heart disease without CHF    Hypoxia 01/09/2020   Memory loss    Multinodular goiter    Obesity (BMI 30-39.9)    OSA on CPAP    "suppose to wear a mask" (03/17/2017)   Peripheral neuropathy    Pneumonia 1990s X 1   "think i had walking pneumonia"   Seasonal allergies    Stroke ([place]) 03/2017   "light  one"; denies residual on 03/17/2017)   Type II diabetes mellitus ([place])    Past Surgical History:  Procedure Laterality Date   BI-VENTRICULAR PACEMAKER UPGRADE  11/05/2015   "upgraded my pacemaker"   BIOPSY THYROID  05/2010   percutaneous   CARDIAC CATHETERIZATION N/A 10/21/2015   Procedure: Left Heart Cath and Cors/Grafts Angiography;  Surgeon: Belva Crome, MD;  Location: [facility];  Service: Cardiovascular;  Laterality: N/A;   CARPAL TUNNEL RELEASE Bilateral    CATARACT EXTRACTION Right 01/06/2015   CATARACT EXTRACTION W/ INTRAOCULAR LENS IMPLANT Left 12/16/2014   CORONARY ARTERY BYPASS GRAFT  2000   CORONARY BALLOON ANGIOPLASTY N/A 01/09/2020   Procedure: CORONARY BALLOON ANGIOPLASTY;  Surgeon: Wellington Hampshire, MD;  Location: [facility];  Service: Cardiovascular;  Laterality: N/A;   CORONARY STENT INTERVENTION N/A 10/28/2017   Procedure: CORONARY STENT INTERVENTION;  Surgeon: Jettie Booze, MD;  Location: [facility];  Service: Cardiovascular;  Laterality: N/A;   CYSTECTOMY Right    "middle finger"   DENTAL IMPLANTS     EP IMPLANTABLE DEVICE N/A 11/05/2015   Procedure: BiV Upgrade;  Surgeon: Deboraha Sprang, MD;  Location: [facility];  Service: Cardiovascular;  Laterality: N/A;   INSERT / REPLACE / REMOVE PACEMAKER     LEAD REVISION N/A 10/25/2012   Procedure: LEAD REVISION;  Surgeon: Evans Lance, MD;  Location: [facility];  Service: Cardiovascular;  Laterality: N/A;   LEFT HEART CATH AND CORS/GRAFTS ANGIOGRAPHY N/A 10/28/2017   Procedure: LEFT HEART CATH AND CORS/GRAFTS ANGIOGRAPHY;  Surgeon: Jettie Booze, MD;  Location: [facility];  Service: Cardiovascular;  Laterality: N/A;   LEFT HEART CATH AND CORS/GRAFTS ANGIOGRAPHY N/A 01/09/2020   Procedure: LEFT HEART CATH AND CORS/GRAFTS ANGIOGRAPHY;  Surgeon: Wellington Hampshire, MD;  Location: [facility];  Service: Cardiovascular;  Laterality: N/A;   LEFT HEART CATHETERIZATION WITH  CORONARY ANGIOGRAM N/A 10/23/2012   Procedure: LEFT HEART CATHETERIZATION WITH CORONARY ANGIOGRAM;  Surgeon: Jacolyn Reedy, MD;  Location: [facility] CATH LAB;  Service: Cardiovascular;  Laterality: N/A;   PERMANENT PACEMAKER INSERTION N/A 10/23/2012   Procedure: PERMANENT PACEMAKER INSERTION;  Surgeon: Deboraha Sprang, MD;  Location: [facility] CATH LAB;  Service: Cardiovascular;  Laterality: N/A;   PLANTAR FASCIA RELEASE Left    "& clipped a tendon that went thru bottom of my foot"   TEMPORARY PACEMAKER INSERTION N/A 10/22/2012   Procedure: TEMPORARY PACEMAKER INSERTION;  Surgeon: Troy Sine, MD;  Location: [facility] CATH LAB;  Service: Cardiovascular;  Laterality: N/A;   TUBAL LIGATION      Current Outpatient Medications  Medication Sig Dispense Refill   ACCU-CHEK FASTCLIX LANCETS MISC USE  TO CHECK BLOOD SUGAR FOUR TIMES DAILY 408 each 2   ACCU-CHEK SMARTVIEW test strip USE  TO CHECK BLOOD SUGAR FOUR TIMES DAILY 400 each 1   Alcohol Swabs (B-D SINGLE USE SWABS REGULAR) PADS Use 8 daily for testing blood sugar and insulin injections. 800 each 1   aspirin EC 81 MG tablet Take 81 mg by mouth daily. Swallow whole.     BD VEO INSULIN SYRINGE U/F 31G X 15/64" 1 ML MISC USE 10 TIMES A DAY WITH INSULIN AS DIRECTED 300 each 0   beta carotene w/minerals (OCUVITE) tablet Take 1 tablet by mouth daily.     Blood Glucose Calibration (ACCU-CHEK SMARTVIEW CONTROL) LIQD Use to calibrate blood glucose meter. 3 each 1   cephALEXin (KEFLEX) 500 MG capsule Take 500 mg by mouth 3 (three) times daily.     cetirizine (ZYRTEC) 10 MG tablet Take 1 tablet (10 mg total) by mouth daily. (Patient not taking: Reported on 03/24/2022)     cholecalciferol (VITAMIN D) 1000 UNITS tablet Take 1,000 Units by mouth daily.  (Patient not taking: Reported on 03/24/2022)     clopidogrel (PLAVIX) 75 MG tablet Take 1 tablet (75 mg total) by mouth daily. 90 tablet 1   Continuous Blood Gluc Sensor (FREESTYLE LIBRE 14 DAY SENSOR) MISC APPLY EVERY 14 DAYS 2  each 0   Dulaglutide (TRULICITY) 9.38 BO/1.7PZ SOPN Inject into the skin once a week.     empagliflozin (JARDIANCE) 10 MG TABS tablet Take 1 tablet (10 mg total) by mouth daily before breakfast. Please keep upcoming appointment in July 2022 for future refills. Thank you (Patient not taking: Reported on 03/24/2022) 90 tablet 0   insulin glargine (LANTUS) 100 UNIT/ML injection Inject 60 Units into the skin 2 (two) times daily.     Insulin Pen Needle (BD PEN NEEDLE NANO U/F) 32G X 4 MM MISC 1 each by Does not apply route 3 (three) times daily. Use to inject insulin TID; E11.40 100 each 2   ketoconazole (NIZORAL) 2 % cream Apply 1 application topically daily as needed for irritation. TO AFFECTED AREAS OF FACE     memantine (NAMENDA)  10 MG tablet Take 1 tablet (10 mg total) by mouth 2 (two) times daily. 180 tablet 3   metoprolol succinate (TOPROL-XL) 50 MG 24 hr tablet Take 1 tablet (50 mg total) by mouth daily. Take with or immediately following a meal. 90 tablet 3   nitroGLYCERIN (NITROSTAT) 0.4 MG SL tablet Place 0.4 mg under the tongue every 5 (five) minutes as needed for chest pain.      pantoprazole (PROTONIX) 40 MG tablet Take 1 tablet (40 mg total) by mouth daily. 90 tablet 1   psyllium (METAMUCIL) 58.6 % powder Take 1 packet by mouth daily as needed (fiber).      rosuvastatin (CRESTOR) 40 MG tablet Take 40 mg by mouth daily.     sacubitril-valsartan (ENTRESTO) 49-51 MG Take 1 tablet by mouth 2 (two) times daily. 180 tablet 1   spironolactone (ALDACTONE) 25 MG tablet Take 0.5 tablets (12.5 mg total) by mouth daily. 45 tablet 3   vitamin C (ASCORBIC ACID) 500 MG tablet Take 500 mg by mouth every other day.     No current facility-administered medications for this visit.    Allergies:   Brilinta [ticagrelor], Statins, Tricor [fenofibrate], Latex, and Tape   Social History: Social History   Socioeconomic History   Marital status: Married    Spouse name: Not on file   Number of children: 2    Years of education: 12   Highest education level: High school graduate  Occupational History    Employer: RETIRED  Tobacco Use   Smoking status: Never   Smokeless tobacco: Never  Vaping Use   Vaping Use: Never used  Substance and Sexual Activity   Alcohol use: No    Alcohol/week: 0.0 standard drinks of alcohol   Drug use: No   Sexual activity: Not Currently  Other Topics Concern   Not on file  Social History Narrative   Lives at home with her husband.   Right-handed.   One cup caffeine per day.   Social Determinants of Health   Financial Resource Strain: Not on file  Food Insecurity: Not on file  Transportation Needs: Not on file  Physical Activity: Not on file  Stress: Not on file  Social Connections: Not on file  Intimate Partner Violence: Not on file    Family History: Family History  Problem Relation Age of Onset   Diabetes Father    Heart disease Father    Diabetes Mother    Diabetes Other        Siblings and 2 children   Heart disease Other        CAD   Colon cancer Neg Hx    Colon polyps Neg Hx     Review of Systems: All other systems reviewed and are otherwise negative except as noted above.   Physical Exam: There were no vitals filed for this visit.   GEN- The patient is well appearing, alert and oriented x 3 today.   HEENT: normocephalic, atraumatic; sclera clear, conjunctiva pink; hearing intact; oropharynx clear; neck supple, no JVP Lymph- no cervical lymphadenopathy Lungs- Clear to ausculation bilaterally, normal work of breathing.  No wheezes, rales, rhonchi Heart- Regular rate and rhythm, no murmurs, rubs or gallops, PMI not laterally displaced GI- soft, non-tender, non-distended, bowel sounds present, no hepatosplenomegaly Extremities- no clubbing or cyanosis. No edema; DP/PT/radial pulses 2+ bilaterally MS- no significant deformity or atrophy Skin- warm and dry, no rash or lesion; ICD pocket well healed Psych- euthymic mood, full  affect Neuro- strength  and sensation are intact  ICD interrogation- reviewed in detail today,  See PACEART report  EKG:  EKG is not ordered today. Personal review of EKG ordered 03/24/2022 shows V pacing at 103 bpm  Recent Labs: 03/24/2022: ALT 14; BUN 17; Creatinine, Ser 1.25; Hemoglobin 11.9; Platelets 215; Potassium 4.4; Sodium 140; TSH 1.540   Wt Readings from Last 3 Encounters:  03/24/22 193 lb 6.4 oz (87.7 kg)  03/31/21 186 lb (84.4 kg)  09/25/20 176 lb 3.2 oz (79.9 kg)     Other studies Reviewed: Additional studies/ records that were reviewed today include: Previous EP office notes.   Assessment and Plan:  1.  Chronic systolic dysfunction s/p St. Jude CRT-D  euvolemic today Stable on an appropriate medical regimen Normal ICD function See Pace Art report No changes today Echo 12/2019 LVEF 30-35%  2. PAF [id number]  of at least 8. <1% AMS burden  3. CAD Follows with gen cards  4. HTN Stable on current regimen   Current medicines are reviewed at length with the patient today.   =  Labs/ tests ordered today include: *** No orders of the defined types were placed in this encounter.   Disposition:   Follow up with Dr. [name] in 6 months    Signed, Annamaria Helling  05/05/2022 8:53 AM  [place] [address] [place] [address] [phone] (office) [phone] (fax)

## 2022-05-10 ENCOUNTER — Other Ambulatory Visit: Payer: Self-pay

## 2022-05-10 DIAGNOSIS — I2510 Atherosclerotic heart disease of native coronary artery without angina pectoris: Secondary | ICD-10-CM

## 2022-05-10 DIAGNOSIS — I110 Hypertensive heart disease with heart failure: Secondary | ICD-10-CM

## 2022-05-10 DIAGNOSIS — Z79899 Other long term (current) drug therapy: Secondary | ICD-10-CM

## 2022-05-10 DIAGNOSIS — I5022 Chronic systolic (congestive) heart failure: Secondary | ICD-10-CM

## 2022-05-10 MED ORDER — METOPROLOL SUCCINATE ER 50 MG PO TB24: 50.0000 mg | ORAL_TABLET | Freq: Every day | ORAL | 3 refills | Status: DC

## 2022-05-10 MED ORDER — ENTRESTO 49-51 MG PO TABS: 1.0000 | ORAL_TABLET | Freq: Two times a day (BID) | ORAL | 3 refills | Status: AC

## 2022-05-10 MED ORDER — SPIRONOLACTONE 25 MG PO TABS: 12.5000 mg | ORAL_TABLET | Freq: Every day | ORAL | 3 refills | Status: AC

## 2022-05-10 MED ORDER — PANTOPRAZOLE SODIUM 40 MG PO TBEC: 40.0000 mg | DELAYED_RELEASE_TABLET | Freq: Every day | ORAL | 3 refills | Status: DC

## 2022-05-10 MED ORDER — NITROGLYCERIN 0.4 MG SL SUBL: 0.4000 mg | SUBLINGUAL_TABLET | SUBLINGUAL | 2 refills | Status: AC | PRN

## 2022-05-10 MED ORDER — EMPAGLIFLOZIN 10 MG PO TABS: 10.0000 mg | ORAL_TABLET | Freq: Every day | ORAL | 3 refills | Status: AC

## 2022-05-10 MED ORDER — ROSUVASTATIN CALCIUM 40 MG PO TABS: 40.0000 mg | ORAL_TABLET | Freq: Every day | ORAL | 3 refills | Status: DC

## 2022-05-10 NOTE — Telephone Encounter (Signed)
Pt's medications were sent to pt's pharmacy as requested. Confirmation received.  

## 2022-05-12 ENCOUNTER — Encounter: Payer: Medicare HMO | Admitting: Student

## 2022-05-12 ENCOUNTER — Ambulatory Visit: Payer: Medicare HMO

## 2022-05-12 DIAGNOSIS — I5022 Chronic systolic (congestive) heart failure: Secondary | ICD-10-CM

## 2022-05-12 DIAGNOSIS — I2510 Atherosclerotic heart disease of native coronary artery without angina pectoris: Secondary | ICD-10-CM

## 2022-05-12 DIAGNOSIS — I480 Paroxysmal atrial fibrillation: Secondary | ICD-10-CM

## 2022-05-13 DIAGNOSIS — E1065 Type 1 diabetes mellitus with hyperglycemia: Secondary | ICD-10-CM | POA: Diagnosis not present

## 2022-05-13 DIAGNOSIS — F0390 Unspecified dementia without behavioral disturbance: Secondary | ICD-10-CM | POA: Diagnosis not present

## 2022-05-13 DIAGNOSIS — I152 Hypertension secondary to endocrine disorders: Secondary | ICD-10-CM | POA: Diagnosis not present

## 2022-05-13 DIAGNOSIS — E1069 Type 1 diabetes mellitus with other specified complication: Secondary | ICD-10-CM | POA: Diagnosis not present

## 2022-05-13 DIAGNOSIS — L98499 Non-pressure chronic ulcer of skin of other sites with unspecified severity: Secondary | ICD-10-CM | POA: Diagnosis not present

## 2022-05-13 DIAGNOSIS — E782 Mixed hyperlipidemia: Secondary | ICD-10-CM | POA: Diagnosis not present

## 2022-05-13 DIAGNOSIS — E10622 Type 1 diabetes mellitus with other skin ulcer: Secondary | ICD-10-CM | POA: Diagnosis not present

## 2022-05-13 DIAGNOSIS — E1059 Type 1 diabetes mellitus with other circulatory complications: Secondary | ICD-10-CM | POA: Diagnosis not present

## 2022-05-13 DIAGNOSIS — R32 Unspecified urinary incontinence: Secondary | ICD-10-CM | POA: Diagnosis not present

## 2022-05-13 DIAGNOSIS — R600 Localized edema: Secondary | ICD-10-CM | POA: Diagnosis not present

## 2022-05-20 DIAGNOSIS — R630 Anorexia: Secondary | ICD-10-CM | POA: Diagnosis not present

## 2022-05-20 DIAGNOSIS — F0390 Unspecified dementia without behavioral disturbance: Secondary | ICD-10-CM | POA: Diagnosis not present

## 2022-05-20 DIAGNOSIS — I152 Hypertension secondary to endocrine disorders: Secondary | ICD-10-CM | POA: Diagnosis not present

## 2022-05-20 DIAGNOSIS — I872 Venous insufficiency (chronic) (peripheral): Secondary | ICD-10-CM | POA: Diagnosis not present

## 2022-05-20 DIAGNOSIS — R600 Localized edema: Secondary | ICD-10-CM | POA: Diagnosis not present

## 2022-05-20 DIAGNOSIS — E10622 Type 1 diabetes mellitus with other skin ulcer: Secondary | ICD-10-CM | POA: Diagnosis not present

## 2022-05-20 DIAGNOSIS — E1059 Type 1 diabetes mellitus with other circulatory complications: Secondary | ICD-10-CM | POA: Diagnosis not present

## 2022-05-20 DIAGNOSIS — L98499 Non-pressure chronic ulcer of skin of other sites with unspecified severity: Secondary | ICD-10-CM | POA: Diagnosis not present

## 2022-06-04 LAB — CUP PACEART REMOTE DEVICE CHECK
Battery Remaining Longevity: 22 mo
Battery Remaining Percentage: 27 %
Battery Voltage: 2.83 V
Brady Statistic AP VP Percent: 5.7 %
Brady Statistic AP VS Percent: 1 %
Brady Statistic AS VP Percent: 94 %
Brady Statistic AS VS Percent: 1 %
Brady Statistic RA Percent Paced: 5.4 %
Date Time Interrogation Session: 20230830033231
HighPow Impedance: 54 Ohm
HighPow Impedance: 54 Ohm
Implantable Lead Implant Date: 20140210
Implantable Lead Implant Date: 20170222
Implantable Lead Implant Date: 20170222
Implantable Lead Location: 753858
Implantable Lead Location: 753859
Implantable Lead Location: 753860
Implantable Lead Model: 4298
Implantable Lead Model: 5076
Implantable Lead Model: 7122
Implantable Pulse Generator Implant Date: 20170222
Lead Channel Impedance Value: 380 Ohm
Lead Channel Impedance Value: 380 Ohm
Lead Channel Impedance Value: 440 Ohm
Lead Channel Pacing Threshold Amplitude: 0.75 V
Lead Channel Pacing Threshold Amplitude: 0.875 V
Lead Channel Pacing Threshold Amplitude: 0.875 V
Lead Channel Pacing Threshold Pulse Width: 0.5 ms
Lead Channel Pacing Threshold Pulse Width: 0.5 ms
Lead Channel Pacing Threshold Pulse Width: 0.5 ms
Lead Channel Sensing Intrinsic Amplitude: 12 mV
Lead Channel Sensing Intrinsic Amplitude: 2.6 mV
Lead Channel Setting Pacing Amplitude: 2 V
Lead Channel Setting Pacing Amplitude: 2 V
Lead Channel Setting Pacing Amplitude: 2 V
Lead Channel Setting Pacing Pulse Width: 0.5 ms
Lead Channel Setting Pacing Pulse Width: 0.5 ms
Lead Channel Setting Sensing Sensitivity: 0.5 mV
Pulse Gen Serial Number: 7341405

## 2022-06-16 DIAGNOSIS — L03115 Cellulitis of right lower limb: Secondary | ICD-10-CM | POA: Diagnosis not present

## 2022-06-16 DIAGNOSIS — L98499 Non-pressure chronic ulcer of skin of other sites with unspecified severity: Secondary | ICD-10-CM | POA: Diagnosis not present

## 2022-06-16 DIAGNOSIS — E10622 Type 1 diabetes mellitus with other skin ulcer: Secondary | ICD-10-CM | POA: Diagnosis not present

## 2022-06-16 DIAGNOSIS — Z23 Encounter for immunization: Secondary | ICD-10-CM | POA: Diagnosis not present

## 2022-06-16 DIAGNOSIS — L97919 Non-pressure chronic ulcer of unspecified part of right lower leg with unspecified severity: Secondary | ICD-10-CM | POA: Diagnosis not present

## 2022-06-21 DIAGNOSIS — S80821D Blister (nonthermal), right lower leg, subsequent encounter: Secondary | ICD-10-CM | POA: Diagnosis not present

## 2022-06-21 DIAGNOSIS — R32 Unspecified urinary incontinence: Secondary | ICD-10-CM | POA: Diagnosis not present

## 2022-06-21 DIAGNOSIS — F0390 Unspecified dementia without behavioral disturbance: Secondary | ICD-10-CM | POA: Diagnosis not present

## 2022-06-21 DIAGNOSIS — E1059 Type 1 diabetes mellitus with other circulatory complications: Secondary | ICD-10-CM | POA: Diagnosis not present

## 2022-06-21 DIAGNOSIS — E782 Mixed hyperlipidemia: Secondary | ICD-10-CM | POA: Diagnosis not present

## 2022-06-21 DIAGNOSIS — H9193 Unspecified hearing loss, bilateral: Secondary | ICD-10-CM | POA: Diagnosis not present

## 2022-06-21 DIAGNOSIS — I872 Venous insufficiency (chronic) (peripheral): Secondary | ICD-10-CM | POA: Diagnosis not present

## 2022-06-21 DIAGNOSIS — E1069 Type 1 diabetes mellitus with other specified complication: Secondary | ICD-10-CM | POA: Diagnosis not present

## 2022-06-21 DIAGNOSIS — I152 Hypertension secondary to endocrine disorders: Secondary | ICD-10-CM | POA: Diagnosis not present

## 2022-06-23 DIAGNOSIS — R32 Unspecified urinary incontinence: Secondary | ICD-10-CM | POA: Diagnosis not present

## 2022-06-23 DIAGNOSIS — E10622 Type 1 diabetes mellitus with other skin ulcer: Secondary | ICD-10-CM | POA: Diagnosis not present

## 2022-06-23 DIAGNOSIS — L97919 Non-pressure chronic ulcer of unspecified part of right lower leg with unspecified severity: Secondary | ICD-10-CM | POA: Diagnosis not present

## 2022-06-23 DIAGNOSIS — L03115 Cellulitis of right lower limb: Secondary | ICD-10-CM | POA: Diagnosis not present

## 2022-06-25 ENCOUNTER — Ambulatory Visit: Payer: Medicare HMO | Admitting: Cardiology

## 2022-06-29 DIAGNOSIS — F0390 Unspecified dementia without behavioral disturbance: Secondary | ICD-10-CM | POA: Diagnosis not present

## 2022-06-29 DIAGNOSIS — E10622 Type 1 diabetes mellitus with other skin ulcer: Secondary | ICD-10-CM | POA: Diagnosis not present

## 2022-06-29 DIAGNOSIS — L97909 Non-pressure chronic ulcer of unspecified part of unspecified lower leg with unspecified severity: Secondary | ICD-10-CM | POA: Diagnosis not present

## 2022-06-29 DIAGNOSIS — R600 Localized edema: Secondary | ICD-10-CM | POA: Diagnosis not present

## 2022-06-29 DIAGNOSIS — L97911 Non-pressure chronic ulcer of unspecified part of right lower leg limited to breakdown of skin: Secondary | ICD-10-CM | POA: Diagnosis not present

## 2022-06-29 DIAGNOSIS — L97921 Non-pressure chronic ulcer of unspecified part of left lower leg limited to breakdown of skin: Secondary | ICD-10-CM | POA: Diagnosis not present

## 2022-07-20 DIAGNOSIS — L97911 Non-pressure chronic ulcer of unspecified part of right lower leg limited to breakdown of skin: Secondary | ICD-10-CM | POA: Diagnosis not present

## 2022-07-20 DIAGNOSIS — L97921 Non-pressure chronic ulcer of unspecified part of left lower leg limited to breakdown of skin: Secondary | ICD-10-CM | POA: Diagnosis not present

## 2022-07-20 DIAGNOSIS — E782 Mixed hyperlipidemia: Secondary | ICD-10-CM | POA: Diagnosis not present

## 2022-07-20 DIAGNOSIS — I152 Hypertension secondary to endocrine disorders: Secondary | ICD-10-CM | POA: Diagnosis not present

## 2022-07-20 DIAGNOSIS — I872 Venous insufficiency (chronic) (peripheral): Secondary | ICD-10-CM | POA: Diagnosis not present

## 2022-07-20 DIAGNOSIS — E1059 Type 1 diabetes mellitus with other circulatory complications: Secondary | ICD-10-CM | POA: Diagnosis not present

## 2022-07-20 DIAGNOSIS — R32 Unspecified urinary incontinence: Secondary | ICD-10-CM | POA: Diagnosis not present

## 2022-07-20 DIAGNOSIS — S80821D Blister (nonthermal), right lower leg, subsequent encounter: Secondary | ICD-10-CM | POA: Diagnosis not present

## 2022-07-20 DIAGNOSIS — F0390 Unspecified dementia without behavioral disturbance: Secondary | ICD-10-CM | POA: Diagnosis not present

## 2022-07-20 DIAGNOSIS — R600 Localized edema: Secondary | ICD-10-CM | POA: Diagnosis not present

## 2022-07-20 DIAGNOSIS — E1069 Type 1 diabetes mellitus with other specified complication: Secondary | ICD-10-CM | POA: Diagnosis not present

## 2022-07-20 DIAGNOSIS — H9193 Unspecified hearing loss, bilateral: Secondary | ICD-10-CM | POA: Diagnosis not present

## 2022-07-21 DIAGNOSIS — F0390 Unspecified dementia without behavioral disturbance: Secondary | ICD-10-CM | POA: Diagnosis not present

## 2022-07-21 DIAGNOSIS — L97911 Non-pressure chronic ulcer of unspecified part of right lower leg limited to breakdown of skin: Secondary | ICD-10-CM | POA: Diagnosis not present

## 2022-07-21 DIAGNOSIS — L97909 Non-pressure chronic ulcer of unspecified part of unspecified lower leg with unspecified severity: Secondary | ICD-10-CM | POA: Diagnosis not present

## 2022-07-21 DIAGNOSIS — Z23 Encounter for immunization: Secondary | ICD-10-CM | POA: Diagnosis not present

## 2022-07-21 DIAGNOSIS — E10622 Type 1 diabetes mellitus with other skin ulcer: Secondary | ICD-10-CM | POA: Diagnosis not present

## 2022-07-21 DIAGNOSIS — R600 Localized edema: Secondary | ICD-10-CM | POA: Diagnosis not present

## 2022-07-21 DIAGNOSIS — L97921 Non-pressure chronic ulcer of unspecified part of left lower leg limited to breakdown of skin: Secondary | ICD-10-CM | POA: Diagnosis not present

## 2022-07-25 NOTE — Progress Notes (Signed)
Cardiology Office Note:    Date:  07/27/2022   ID:  [name], DOB 11-12-1945, MRN [id number]  PCP:  Haywood Pao, MD  [facility] Cardiologist:  Freada Bergeron, MD  [facility] Electrophysiologist:  Virl Axe, MD   Referring MD: Haywood Pao, MD    History of Present Illness:    Laurie [name] is a 76 y.o. female with a hx of with a history of CAD (CABG 2000 >> 12/2019 POBA to SVG OM; PCI to SVG), Bells Palsy, HTN, HLD, chronic systolic heart failure, AFib, CVA (left with chronic balance problems), OSA (uses CPAP), DM, CHB s/p SJM CRT-D 11/05/2015 (RV/LV leads 2017), RA lead 2014 who presents to clinic for follow-up.  During visit on 08/05/20, she was doing well from a CV perspective. We added spironolactone and jardiance for GDMT for underlying heart failure.  Last seen in clinic on 03/2022 by Ermalinda Barrios where she was stable from a CV standpoint. Was being treated for cellulitis.  Today, the patient overall feels okay. No chest pain. Has been having LE blisters on her legs for which she has seen wound care. The patient has been taking off her dressings at home and they are working on her keeping them in place. She has been continued on spironolactone but difficult to take lasix as she has urinary incontinence and there is concern that this may contaminate her wound as she does not have someone at home to regularly change her (she smells of urine today so this is a significant concern).  Otherwise, no SOB, orthopnea or PND. Blood pressure is well controlled at home and running 110s/60s at PCP office. Has not been gaining weight. No bleeding issues on the plavix.    Past Medical History:  Diagnosis Date   AICD (automatic cardioverter/defibrillator) present    Bell's palsy 01/05/2010   CAD (coronary artery disease)    Cath  07/21/1999  mild ostial, 80% stenosis proximal LAD, 90% stenosis proximal Diag 1, 95% stenosis proximal OM 1, 80% stenosis proximal  RCA, 95% stenosis mid RCA    CABG 07/22/99  LIMA to LAD, SVG to dx, SVG to OM, SVG to PDA  Dr. [name]   Cardiolite 2012 no ischemia EF 67%  Cath 10/23/12 Severe native three-vessel coronary artery disease with occlusion of all 3 native coronary arteries, Patent sap   Chronic lower back pain    Chronic systolic CHF (congestive heart failure) ([place]) 10/22/2015   Cystitis    Gait abnormality    Hepatitis 1970s   "don't know which" (03/17/2017)   HIstory of Bell's palsy    October 2012    Hyperlipidemia    Intolerance to several statins    Hypertensive heart disease without CHF    Hypoxia 01/09/2020   Memory loss    Multinodular goiter    Obesity (BMI 30-39.9)    OSA on CPAP    "suppose to wear a mask" (03/17/2017)   Peripheral neuropathy    Pneumonia 1990s X 1   "think i had walking pneumonia"   Seasonal allergies    Stroke ([place]) 03/2017   "light one"; denies residual on 03/17/2017)   Type II diabetes mellitus ([place])     Past Surgical History:  Procedure Laterality Date   BI-VENTRICULAR PACEMAKER UPGRADE  11/05/2015   "upgraded my pacemaker"   BIOPSY THYROID  05/2010   percutaneous   CARDIAC CATHETERIZATION N/A 10/21/2015   Procedure: Left Heart Cath and Cors/Grafts Angiography;  Surgeon: Lynnell Dike  Tamala Julian, MD;  Location: [place] [facility];  Service: Cardiovascular;  Laterality: N/A;   CARPAL TUNNEL RELEASE Bilateral    CATARACT EXTRACTION Right 01/06/2015   CATARACT EXTRACTION W/ INTRAOCULAR LENS IMPLANT Left 12/16/2014   CORONARY ARTERY BYPASS GRAFT  2000   CORONARY BALLOON ANGIOPLASTY N/A 01/09/2020   Procedure: CORONARY BALLOON ANGIOPLASTY;  Surgeon: Wellington Hampshire, MD;  Location: [place] [facility];  Service: Cardiovascular;  Laterality: N/A;   CORONARY STENT INTERVENTION N/A 10/28/2017   Procedure: CORONARY STENT INTERVENTION;  Surgeon: Jettie Booze, MD;  Location: [place] [facility];  Service: Cardiovascular;  Laterality: N/A;   CYSTECTOMY Right    "middle finger"   DENTAL  IMPLANTS     EP IMPLANTABLE DEVICE N/A 11/05/2015   Procedure: BiV Upgrade;  Surgeon: Deboraha Sprang, MD;  Location: [place] [facility];  Service: Cardiovascular;  Laterality: N/A;   INSERT / REPLACE / REMOVE PACEMAKER     LEAD REVISION N/A 10/25/2012   Procedure: LEAD REVISION;  Surgeon: Evans Lance, MD;  Location: [facility] CATH LAB;  Service: Cardiovascular;  Laterality: N/A;   LEFT HEART CATH AND CORS/GRAFTS ANGIOGRAPHY N/A 10/28/2017   Procedure: LEFT HEART CATH AND CORS/GRAFTS ANGIOGRAPHY;  Surgeon: Jettie Booze, MD;  Location: [place] [facility];  Service: Cardiovascular;  Laterality: N/A;   LEFT HEART CATH AND CORS/GRAFTS ANGIOGRAPHY N/A 01/09/2020   Procedure: LEFT HEART CATH AND CORS/GRAFTS ANGIOGRAPHY;  Surgeon: Wellington Hampshire, MD;  Location: [place] [facility];  Service: Cardiovascular;  Laterality: N/A;   LEFT HEART CATHETERIZATION WITH CORONARY ANGIOGRAM N/A 10/23/2012   Procedure: LEFT HEART CATHETERIZATION WITH CORONARY ANGIOGRAM;  Surgeon: Jacolyn Reedy, MD;  Location: [facility] CATH LAB;  Service: Cardiovascular;  Laterality: N/A;   PERMANENT PACEMAKER INSERTION N/A 10/23/2012   Procedure: PERMANENT PACEMAKER INSERTION;  Surgeon: Deboraha Sprang, MD;  Location: [facility] CATH LAB;  Service: Cardiovascular;  Laterality: N/A;   PLANTAR FASCIA RELEASE Left    "& clipped a tendon that went thru bottom of my foot"   TEMPORARY PACEMAKER INSERTION N/A 10/22/2012   Procedure: TEMPORARY PACEMAKER INSERTION;  Surgeon: Troy Sine, MD;  Location: [facility] C[facility] LAB;  Service: Cardiovascular;  Laterality: N/A;   TUBAL LIGATION      Current Medications: Current Meds  Medication Sig   ACCU-CHEK FASTCLIX LANCETS MISC USE  TO CHECK BLOOD SUGAR FOUR TIMES DAILY   ACCU-CHEK SMARTVIEW test strip USE  TO CHECK BLOOD SUGAR FOUR TIMES DAILY   Alcohol Swabs (B-D SINGLE USE SWABS REGULAR) PADS Use 8 daily for testing blood sugar and insulin injections.   aspirin EC 81 MG tablet Take 81 mg by mouth daily. Swallow  whole.   BD VEO INSULIN SYRINGE U/F 31G X 15/64" 1 ML MISC USE 10 TIMES A DAY WITH INSULIN AS DIRECTED   beta carotene w/minerals (OCUVITE) tablet Take 1 tablet by mouth daily.   Blood Glucose Calibration (ACCU-CHEK SMARTVIEW CONTROL) LIQD Use to calibrate blood glucose meter.   cephALEXin (KEFLEX) 500 MG capsule Take 500 mg by mouth 3 (three) times daily.   cetirizine (ZYRTEC) 10 MG tablet Take 1 tablet (10 mg total) by mouth daily.   cholecalciferol (VITAMIN D) 1000 UNITS tablet Take 1,000 Units by mouth daily.   clopidogrel (PLAVIX) 75 MG tablet Take 1 tablet (75 mg total) by mouth daily.   Continuous Blood Gluc Sensor (FREESTYLE LIBRE 14 DAY SENSOR) MISC APPLY EVERY 14 DAYS   Dulaglutide (TRULICITY) 3.26 ZT/2.4PY SOPN Inject into  the skin once a week.   empagliflozin (JARDIANCE) 10 MG TABS tablet Take 1 tablet (10 mg total) by mouth daily before breakfast.   insulin glargine (LANTUS) 100 UNIT/ML injection Inject 60 Units into the skin 2 (two) times daily.   Insulin Pen Needle (BD PEN NEEDLE NANO U/F) 32G X 4 MM MISC 1 each by Does not apply route 3 (three) times daily. Use to inject insulin TID; E11.40   ketoconazole (NIZORAL) 2 % cream Apply 1 application topically daily as needed for irritation. TO AFFECTED AREAS OF FACE   memantine (NAMENDA) 10 MG tablet Take 1 tablet (10 mg total) by mouth 2 (two) times daily.   metoprolol succinate (TOPROL-XL) 50 MG 24 hr tablet Take 1 tablet (50 mg total) by mouth daily. Take with or immediately following a meal.   nitroGLYCERIN (NITROSTAT) 0.4 MG SL tablet Place 1 tablet (0.4 mg total) under the tongue every 5 (five) minutes as needed for chest pain.   pantoprazole (PROTONIX) 40 MG tablet Take 1 tablet (40 mg total) by mouth daily.   psyllium (METAMUCIL) 58.6 % powder Take 1 packet by mouth daily as needed (fiber).    rosuvastatin (CRESTOR) 40 MG tablet Take 1 tablet (40 mg total) by mouth daily.   sacubitril-valsartan (ENTRESTO) 49-51 MG Take 1 tablet  by mouth 2 (two) times daily.   spironolactone (ALDACTONE) 25 MG tablet Take 0.5 tablets (12.5 mg total) by mouth daily.   vitamin C (ASCORBIC ACID) 500 MG tablet Take 500 mg by mouth every other day.     Allergies:   Brilinta [ticagrelor], Statins, Tricor [fenofibrate], Latex, and Tape   Social History   Socioeconomic History   Marital status: Married    Spouse name: Not on file   Number of children: 2   Years of education: 12   Highest education level: High school graduate  Occupational History    Employer: RETIRED  Tobacco Use   Smoking status: Never   Smokeless tobacco: Never  Vaping Use   Vaping Use: Never used  Substance and Sexual Activity   Alcohol use: No    Alcohol/week: 0.0 standard drinks of alcohol   Drug use: No   Sexual activity: Not Currently  Other Topics Concern   Not on file  Social History Narrative   Lives at home with her husband.   Right-handed.   One cup caffeine per day.   Social Determinants of Health   Financial Resource Strain: Not on file  Food Insecurity: Not on file  Transportation Needs: Not on file  Physical Activity: Not on file  Stress: Not on file  Social Connections: Not on file     Family History: The patient's family history includes Diabetes in her father, mother, and another family member; Heart disease in her father and another family member. There is no history of Colon cancer or Colon polyps.  ROS:   Please see the history of present illness.    Review of Systems  Constitutional:  Positive for malaise/fatigue. Negative for chills, diaphoresis and fever.  HENT:  Negative for nosebleeds.   Eyes:  Negative for blurred vision.  Respiratory:  Negative for shortness of breath.   Cardiovascular:  Positive for leg swelling. Negative for chest pain, palpitations, orthopnea, claudication and PND.  Gastrointestinal:  Negative for nausea and vomiting.  Genitourinary:  Negative for hematuria.  Musculoskeletal:  Negative for  falls.  Neurological:  Negative for dizziness and loss of consciousness.  Psychiatric/Behavioral:  Negative for substance abuse.  EKGs/Labs/Other Studies Reviewed:    The following studies were reviewed today: LHC 02-08-20: Mid RCA lesion is 100% stenosed. Ost RCA to Prox RCA lesion is 50% stenosed. Previously placed Prox Graft stent (unknown type) is widely patent. Mid Graft lesion is 65% stenosed. Origin lesion is 100% stenosed. LIMA and is normal in caliber. The graft exhibits no disease. Mid LAD to Dist LAD lesion is 80% stenosed. Origin to Prox Graft lesion is 95% stenosed. Post intervention, there is a 10% residual stenosis. Balloon angioplasty was performed using a BALLOON [place] EMERGE MR 3.0X20. Dist LM lesion is 80% stenosed. Ost Cx to Prox Cx lesion is 100% stenosed. Ost LAD to Prox LAD lesion is 100% stenosed.   1.  Severe underlying three-vessel coronary artery disease with occluded native vessels. 2.  Known chronically occluded SVG to right PDA.  LIMA to LAD is patent.  However, there is chronic diffuse disease in the mid and distal LAD.  SVG to diagonal is patent with no significant in-stent restenosis.  However, there is a borderline 60 to 70% lesion after the stent.  SVG to OM is patent with severe 95% in-stent restenosis proximally which is the likely culprit for myocardial infarction. 3.  Left ventricular angiography was not performed.  EF was 30 to 35% by echo.  Mildly elevated left ventricular end-diastolic pressure at 16 mmHg. 4.  Successful balloon angioplasty of SVG to OM for severe in-stent restenosis.   Recommendations: This was a very difficult and prolonged procedure from the left radial artery given inability to engage the SVG to OM.  There is significant tortuosity in the left subclavian artery.  I had to switch to a femoral approach. Avoid left radial catheterization in the future. Continue dual antiplatelet therapy indefinitely if possible. Aggressive  treatment of risk factors. The lesion in the SVG to diagonal can likely be monitored for now.  PCI can be considered if she has residual angina.   TTE 02-08-2020 IMPRESSIONS   1. Left ventricular ejection fraction, by estimation, is 30 to 35%. The  left ventricle has moderately decreased function. The left ventricle has  no regional wall motion abnormalities. The left ventricular internal  cavity size was mildly dilated. Left  ventricular diastolic parameters are consistent with Grade II diastolic  dysfunction (pseudonormalization). Elevated left ventricular end-diastolic  pressure. There is akinesis of the left ventricular, entire inferior wall,  inferolateral wall and apical  segment.   2. Right ventricular systolic function is normal. The right ventricular  size is normal. There is normal pulmonary artery systolic pressure.   3. Left atrial size was mildly dilated.   4. The mitral valve is normal in structure. Mild mitral valve  regurgitation. No evidence of mitral stenosis.   5. The aortic valve is tricuspid. Aortic valve regurgitation is not  visualized. Mild aortic valve sclerosis is present, with no evidence of  aortic valve stenosis.   6. The inferior vena cava is normal in size with greater than 50%  respiratory variability, suggesting right atrial pressure of 3 mmHg.    LHC 10/28/17: Severe three vessel CAD. Prox LAD to Mid LAD lesion is 100% stenosed. LIMA to LAD is patent. 1st Diag-1 lesion is 100% stenosed. SVG to diagonal is patent with 80% stenosis. A drug-eluting stent was successfully placed using a STENT RESOLUTE ONYX 2.5X12 in the SVG to diagonal. Post intervention, there is a 0% residual stenosis. Dist LAD lesion is 75% stenosed past LIMA insertion. 2nd Mrg lesion is 100% stenosed. Ost 1st  Mrg to 1st Mrg lesion is 100% stenosed. SVG to OM is occluded. Post intervention, there is a 0% residual stenosis. A drug-eluting stent was successfully placed using a STENT  SYNERGY DES 3X20. This appears to be a fresh occlusion and was successfully treated. Mid RCA to Dist RCA lesion is 100% stenosed. SVG to PDA is occluded, LIMA and is normal in caliber. Ost Cx to Dist Cx lesion is 70% stenosed. The left ventricular ejection fraction is 45-50% by visual estimate. There is mild left ventricular systolic dysfunction. LV end diastolic pressure is moderately elevated. There is no aortic valve stenosis.   Successful two vessel PCI to the SVG to diagonal and SVG to OM, with DES.  Continue dual antiplatelet therapy for at least one year and possibly clopidogrel indefinitely going forward.  Finish current bag of angiomax.    Recent Labs: 03/24/2022: ALT 14; BUN 17; Creatinine, Ser 1.25; Hemoglobin 11.9; Platelets 215; Potassium 4.4; Sodium 140; TSH 1.540  Recent Lipid Panel    Component Value Date/Time   CHOL 168 01/11/2020 0332   TRIG 151 (H) 01/11/2020 0332   HDL 38 (L) 01/11/2020 0332   CHOLHDL 4.4 01/11/2020 0332   VLDL 30 01/11/2020 0332   LDLCALC 100 (H) 01/11/2020 0332   LDLDIRECT 155.1 06/05/2012 1340     Physical Exam:    VS:  BP 138/72   Pulse 64   Ht '5\' 2"'$  (1.575 m)   Wt 167 lb (75.8 kg)   SpO2 94%   BMI 30.54 kg/m     Wt Readings from Last 3 Encounters:  07/27/22 167 lb (75.8 kg)  03/24/22 193 lb 6.4 oz (87.7 kg)  03/31/21 186 lb (84.4 kg)     GEN:  Comfortable, NAD HEENT: Normal NECK: No JVD; No carotid bruits CARDIAC: RRR, no murmurs, rubs, gallops RESPIRATORY:  Clear to auscultation without rales, wheezing or rhonchi  ABDOMEN: Soft, non-tender, non-distended MUSCULOSKELETAL:  Bilateral leg wraps in place  SKIN: Warm and dry NEUROLOGIC:  Alert and oriented x 3 PSYCHIATRIC:  Normal affect   ASSESSMENT:    1. Chronic systolic congestive heart failure ([place])   2. Coronary artery disease involving native coronary artery of native heart without angina pectoris   3. Hypertensive heart disease with chronic systolic congestive heart  failure ([place])   4. Hyperlipidemia, unspecified hyperlipidemia type   5. Type 2 diabetes mellitus with complication, with long-term current use of insulin ([place])   6. Medication management   7. Complete heart block ([place])   8. Paroxysmal atrial fibrillation (H[facility])   9. Biventricular automatic implantable cardioverter defibrillator in situ   10. Multiple opens wound of lower extremity, unspecified laterality, sequela     PLAN:    In order of problems listed above:  #Chronic systolic and diastolic heart failure: EF 30-35%. NYHA class II symptoms. Appears euvolemic on exam with normal impedences on CorVue on most recent check. Taking all medications as prescribed and tolerating them well.  -Continue metop '50mg'$  XL -Continue entresto to 49/'51mg'$  BID -Continue spironolactone 12.'5mg'$  daily -Continue jardiance '10mg'$  daily -Hesitant to add lasix for LE edema given that she is incontinent of urine and does not have constant care at home to have regular changes -Will check BNP today to ensure not elevated -S/p CRT-D and followed by Dr. [name] with normal function -Monitor daily weights -Low Na diet   #Severe multivessel CAD s/p CABG in 2000 with subsequent PCIs: Last cath 12/2019. LIMA-LAD (patent) with distal diffuse LAD disease; SVG-RCA known occluded;  SVG to diag is s/p PCI with patent stent but 60-70% stenosis after stent. SVG-OM with recent POBA for in-stent restenosis.  -NO FUTURE CATHS FROM LEFT RADIAL due to severe tortuosity of SCA -Continue ASA '81mg'$  and plavix '75mg'$  indefinitely -Continue protonix as patient reports GI upset with ASA; no bleeding issues -Will need to monitor for anginal symptoms due to known 60-70% stenosis of SVG-Diag  -Continue crestor '40mg'$  daily   #Paroxysmal Afib: CHADs-vasc 8. Not on AC due to low Afib burden. -Follows with Dr. [name] -CRT-D in place as above -Continue metop '50mg'$  XL   #HTN: Much better controlled at home. -Continue metop '50mg'$  XL -Continue  entresto  49/'51mg'$  BID -Continue spironolactone 12.'5mg'$  daily  #HLD: -Continue crestor '40mg'$  daily -Check lipids today (nonfasting but to ease appointment burden for patient)   #DMII: Improved with A1C 6.3. -Continue insulin; recently adjusted  -Continue jardiance '10mg'$  daily as above -Continue trulicity 0.'75mg'$  1x/week -Management per primary care  #LE Wounds: Following with wound care. Difficult situation as she is incontinent and increasing diuretics may lead to her sitting in her urine. Will check BNP today and she is also scheduled for LE venous ultrasounds. May ultimately benefit from vascular evaluation.   Medication Adjustments/Labs and Tests Ordered: Current medicines are reviewed at length with the patient today.  Concerns regarding medicines are outlined above.  Orders Placed This Encounter  Procedures   Lipid Profile   Pro b natriuretic peptide   Basic metabolic panel   ECHOCARDIOGRAM LIMITED    No orders of the defined types were placed in this encounter.    Patient Instructions  Medication Instructions:   Your physician recommends that you continue on your current medications as directed. Please refer to the Current Medication list given to you today.  *If you need a refill on your cardiac medications before your next appointment, please call your pharmacy*   Lab Work:  TODAY--BMET, PRO-BNP, AND LIPIDS  If you have labs (blood work) drawn today and your tests are completely normal, you will receive your results only by: [place] (if you have MyChart) OR A paper copy in the mail If you have any lab test that is abnormal or we need to change your treatment, we will call you to review the results.   Testing/Procedures:  Your physician has requested that you have an LIMITED  echocardiogram. Echocardiography is a painless test that uses sound waves to create images of your heart. It provides your doctor with information about the size and shape of your  heart and how well your heart's chambers and valves are working. This procedure takes approximately one hour. There are no restrictions for this procedure. Please do NOT wear cologne, perfume, aftershave, or lotions (deodorant is allowed). Please arrive 15 minutes prior to your appointment time.    Follow-Up: At [facility], you and your health needs are our priority.  As part of our continuing mission to provide you with exceptional heart care, we have created designated Provider Care Teams.  These Care Teams include your primary Cardiologist (physician) and Advanced Practice Providers (APPs -  Physician Assistants and Nurse Practitioners) who all work together to provide you with the care you need, when you need it.  We recommend signing up for the patient portal called "MyChart".  Sign up information is provided on this After Visit Summary.  MyChart is used to connect with patients for Virtual Visits (Telemedicine).  Patients are able to view lab/test results, encounter notes, upcoming appointments, etc.  Non-urgent messages can be sent to your provider as well.   To learn more about what you can do with MyChart, go to [email or web address].    Your next appointment:   6 month(s)  The format for your next appointment:   In Person  Provider:   Nicholes Rough, PA-C, Melina Copa, PA-C, Ambrose Pancoast, NP, Cecilie Kicks, NP, Ermalinda Barrios, PA-C, Christen Bame, NP, or Richardson Dopp, PA-C          Important Information About Sugar          Signed, Freada Bergeron, MD  07/27/2022 3:22 PM    [place]

## 2022-07-27 ENCOUNTER — Ambulatory Visit: Payer: Medicare HMO | Attending: Cardiology | Admitting: Cardiology

## 2022-07-27 ENCOUNTER — Encounter: Payer: Self-pay | Admitting: Cardiology

## 2022-07-27 VITALS — BP 138/72 | HR 64 | Ht 62.0 in | Wt 167.0 lb

## 2022-07-27 DIAGNOSIS — Z79899 Other long term (current) drug therapy: Secondary | ICD-10-CM | POA: Diagnosis not present

## 2022-07-27 DIAGNOSIS — I480 Paroxysmal atrial fibrillation: Secondary | ICD-10-CM | POA: Diagnosis not present

## 2022-07-27 DIAGNOSIS — Z794 Long term (current) use of insulin: Secondary | ICD-10-CM | POA: Diagnosis not present

## 2022-07-27 DIAGNOSIS — Z95810 Presence of automatic (implantable) cardiac defibrillator: Secondary | ICD-10-CM

## 2022-07-27 DIAGNOSIS — E785 Hyperlipidemia, unspecified: Secondary | ICD-10-CM

## 2022-07-27 DIAGNOSIS — E118 Type 2 diabetes mellitus with unspecified complications: Secondary | ICD-10-CM

## 2022-07-27 DIAGNOSIS — I442 Atrioventricular block, complete: Secondary | ICD-10-CM | POA: Diagnosis not present

## 2022-07-27 DIAGNOSIS — I2510 Atherosclerotic heart disease of native coronary artery without angina pectoris: Secondary | ICD-10-CM | POA: Diagnosis not present

## 2022-07-27 DIAGNOSIS — I5022 Chronic systolic (congestive) heart failure: Secondary | ICD-10-CM

## 2022-07-27 DIAGNOSIS — S81809S Unspecified open wound, unspecified lower leg, sequela: Secondary | ICD-10-CM

## 2022-07-27 DIAGNOSIS — I110 Hypertensive heart disease with heart failure: Secondary | ICD-10-CM

## 2022-07-27 NOTE — Patient Instructions (Signed)
Medication Instructions:   Your physician recommends that you continue on your current medications as directed. Please refer to the Current Medication list given to you today.  *If you need a refill on your cardiac medications before your next appointment, please call your pharmacy*   Lab Work:  TODAY--BMET, PRO-BNP, AND LIPIDS  If you have labs (blood work) drawn today and your tests are completely normal, you will receive your results only by: [place] (if you have MyChart) OR A paper copy in the mail If you have any lab test that is abnormal or we need to change your treatment, we will call you to review the results.   Testing/Procedures:  Your physician has requested that you have an LIMITED  echocardiogram. Echocardiography is a painless test that uses sound waves to create images of your heart. It provides your doctor with information about the size and shape of your heart and how well your heart's chambers and valves are working. This procedure takes approximately one hour. There are no restrictions for this procedure. Please do NOT wear cologne, perfume, aftershave, or lotions (deodorant is allowed). Please arrive 15 minutes prior to your appointment time.    Follow-Up: At [facility], you and your health needs are our priority.  As part of our continuing mission to provide you with exceptional heart care, we have created designated Provider Care Teams.  These Care Teams include your primary Cardiologist (physician) and Advanced Practice Providers (APPs -  Physician Assistants and Nurse Practitioners) who all work together to provide you with the care you need, when you need it.  We recommend signing up for the patient portal called "MyChart".  Sign up information is provided on this After Visit Summary.  MyChart is used to connect with patients for Virtual Visits (Telemedicine).  Patients are able to view lab/test results, encounter notes, upcoming appointments,  etc.  Non-urgent messages can be sent to your provider as well.   To learn more about what you can do with MyChart, go to [email or web address].    Your next appointment:   6 month(s)  The format for your next appointment:   In Person  Provider:   Nicholes Rough, PA-C, Melina Copa, PA-C, Ambrose Pancoast, NP, Cecilie Kicks, NP, Ermalinda Barrios, PA-C, Christen Bame, NP, or Richardson Dopp, PA-C          Important Information About Sugar

## 2022-07-28 DIAGNOSIS — L97921 Non-pressure chronic ulcer of unspecified part of left lower leg limited to breakdown of skin: Secondary | ICD-10-CM | POA: Diagnosis not present

## 2022-07-28 DIAGNOSIS — L97911 Non-pressure chronic ulcer of unspecified part of right lower leg limited to breakdown of skin: Secondary | ICD-10-CM | POA: Diagnosis not present

## 2022-07-28 DIAGNOSIS — L97909 Non-pressure chronic ulcer of unspecified part of unspecified lower leg with unspecified severity: Secondary | ICD-10-CM | POA: Diagnosis not present

## 2022-07-28 DIAGNOSIS — E10622 Type 1 diabetes mellitus with other skin ulcer: Secondary | ICD-10-CM | POA: Diagnosis not present

## 2022-07-28 DIAGNOSIS — R600 Localized edema: Secondary | ICD-10-CM | POA: Diagnosis not present

## 2022-07-28 LAB — BASIC METABOLIC PANEL
BUN/Creatinine Ratio: 17 (ref 12–28)
BUN: 22 mg/dL (ref 8–27)
CO2: 26 mmol/L (ref 20–29)
Calcium: 9.5 mg/dL (ref 8.7–10.3)
Chloride: 103 mmol/L (ref 96–106)
Creatinine, Ser: 1.3 mg/dL — ABNORMAL HIGH (ref 0.57–1.00)
Glucose: 167 mg/dL — ABNORMAL HIGH (ref 70–99)
Potassium: 4.8 mmol/L (ref 3.5–5.2)
Sodium: 144 mmol/L (ref 134–144)
eGFR: 43 mL/min/{1.73_m2} — ABNORMAL LOW (ref >59)

## 2022-07-28 LAB — LIPID PANEL
Chol/HDL Ratio: 2.9 ratio (ref 0.0–4.4)
Cholesterol, Total: 117 mg/dL (ref 100–199)
HDL: 41 mg/dL (ref >39)
LDL Chol Calc (NIH): 49 mg/dL (ref 0–99)
Triglycerides: 162 mg/dL — ABNORMAL HIGH (ref 0–149)
VLDL Cholesterol Cal: 27 mg/dL (ref 5–40)

## 2022-07-28 LAB — PRO B NATRIURETIC PEPTIDE: NT-Pro BNP: 861 pg/mL — ABNORMAL HIGH (ref 0–738)

## 2022-07-30 ENCOUNTER — Telehealth: Payer: Self-pay | Admitting: Cardiology

## 2022-07-30 DIAGNOSIS — L97921 Non-pressure chronic ulcer of unspecified part of left lower leg limited to breakdown of skin: Secondary | ICD-10-CM | POA: Diagnosis not present

## 2022-07-30 NOTE — Telephone Encounter (Signed)
Laurie Bergeron, MD  Nuala Alpha, LPN Her cholesterol is great. Her fluid levels are slightly elevated and she would likely benefit from lasix dosing but I want to make sure she has a reliable way to get to and from the bathroom/be changed so she does not sit in urine.  Otherwise, kidney function is stable. Electrolytes look okay.  Called patient's daughter back to let her know about patient's lab results. Daughter stated that patient could not be able to get to and from the bathroom quickly with or without help. Daughter stated that she or other family members cannot be there all the time to help her, so she does not think taking the lasix would be a good idea.

## 2022-07-30 NOTE — Telephone Encounter (Signed)
Patient's daughter is returning call to discuss lab results. 

## 2022-08-02 DIAGNOSIS — L97921 Non-pressure chronic ulcer of unspecified part of left lower leg limited to breakdown of skin: Secondary | ICD-10-CM | POA: Diagnosis not present

## 2022-08-04 DIAGNOSIS — R600 Localized edema: Secondary | ICD-10-CM | POA: Diagnosis not present

## 2022-08-04 DIAGNOSIS — L97911 Non-pressure chronic ulcer of unspecified part of right lower leg limited to breakdown of skin: Secondary | ICD-10-CM | POA: Diagnosis not present

## 2022-08-19 DIAGNOSIS — S80821D Blister (nonthermal), right lower leg, subsequent encounter: Secondary | ICD-10-CM | POA: Diagnosis not present

## 2022-08-19 DIAGNOSIS — E1059 Type 1 diabetes mellitus with other circulatory complications: Secondary | ICD-10-CM | POA: Diagnosis not present

## 2022-08-19 DIAGNOSIS — I152 Hypertension secondary to endocrine disorders: Secondary | ICD-10-CM | POA: Diagnosis not present

## 2022-08-19 DIAGNOSIS — E1069 Type 1 diabetes mellitus with other specified complication: Secondary | ICD-10-CM | POA: Diagnosis not present

## 2022-08-19 DIAGNOSIS — H9193 Unspecified hearing loss, bilateral: Secondary | ICD-10-CM | POA: Diagnosis not present

## 2022-08-19 DIAGNOSIS — E782 Mixed hyperlipidemia: Secondary | ICD-10-CM | POA: Diagnosis not present

## 2022-08-19 DIAGNOSIS — R32 Unspecified urinary incontinence: Secondary | ICD-10-CM | POA: Diagnosis not present

## 2022-08-19 DIAGNOSIS — F0390 Unspecified dementia without behavioral disturbance: Secondary | ICD-10-CM | POA: Diagnosis not present

## 2022-08-19 DIAGNOSIS — I872 Venous insufficiency (chronic) (peripheral): Secondary | ICD-10-CM | POA: Diagnosis not present

## 2022-08-25 ENCOUNTER — Other Ambulatory Visit (HOSPITAL_COMMUNITY): Payer: Medicare HMO

## 2022-08-26 ENCOUNTER — Encounter (HOSPITAL_COMMUNITY): Payer: Self-pay | Admitting: Cardiology

## 2022-09-09 ENCOUNTER — Ambulatory Visit (INDEPENDENT_AMBULATORY_CARE_PROVIDER_SITE_OTHER): Payer: Medicare HMO

## 2022-09-09 DIAGNOSIS — I442 Atrioventricular block, complete: Secondary | ICD-10-CM

## 2022-09-09 LAB — CUP PACEART REMOTE DEVICE CHECK
Battery Remaining Longevity: 19 mo
Battery Remaining Percentage: 24 %
Battery Voltage: 2.8 V
Brady Statistic AP VP Percent: 11 %
Brady Statistic AP VS Percent: 1 %
Brady Statistic AS VP Percent: 88 %
Brady Statistic AS VS Percent: 1 %
Brady Statistic RA Percent Paced: 10 %
Date Time Interrogation Session: 20231228062411
HighPow Impedance: 62 Ohm
HighPow Impedance: 62 Ohm
Implantable Lead Connection Status: 753985
Implantable Lead Connection Status: 753985
Implantable Lead Connection Status: 753985
Implantable Lead Implant Date: 20140210
Implantable Lead Implant Date: 20170222
Implantable Lead Implant Date: 20170222
Implantable Lead Location: 753858
Implantable Lead Location: 753859
Implantable Lead Location: 753860
Implantable Lead Model: 4298
Implantable Lead Model: 5076
Implantable Lead Model: 7122
Implantable Pulse Generator Implant Date: 20170222
Lead Channel Impedance Value: 350 Ohm
Lead Channel Impedance Value: 380 Ohm
Lead Channel Impedance Value: 440 Ohm
Lead Channel Pacing Threshold Amplitude: 0.75 V
Lead Channel Pacing Threshold Amplitude: 0.875 V
Lead Channel Pacing Threshold Amplitude: 1 V
Lead Channel Pacing Threshold Pulse Width: 0.5 ms
Lead Channel Pacing Threshold Pulse Width: 0.5 ms
Lead Channel Pacing Threshold Pulse Width: 0.5 ms
Lead Channel Sensing Intrinsic Amplitude: 12 mV
Lead Channel Sensing Intrinsic Amplitude: 3.1 mV
Lead Channel Setting Pacing Amplitude: 2 V
Lead Channel Setting Pacing Amplitude: 2 V
Lead Channel Setting Pacing Amplitude: 2 V
Lead Channel Setting Pacing Pulse Width: 0.5 ms
Lead Channel Setting Pacing Pulse Width: 0.5 ms
Lead Channel Setting Sensing Sensitivity: 0.5 mV
Pulse Gen Serial Number: 7341405

## 2022-09-28 NOTE — Progress Notes (Signed)
Remote ICD transmission.   

## 2022-10-06 ENCOUNTER — Encounter (HOSPITAL_COMMUNITY): Payer: Self-pay

## 2022-10-06 ENCOUNTER — Other Ambulatory Visit: Payer: Self-pay

## 2022-10-06 ENCOUNTER — Emergency Department (HOSPITAL_COMMUNITY)
Admission: EM | Admit: 2022-10-06 | Discharge: 2022-10-06 | Disposition: A | Discharge status: Home / Self Care | Payer: Medicare HMO | Attending: Emergency Medicine | Admitting: Emergency Medicine

## 2022-10-06 DIAGNOSIS — Z7982 Long term (current) use of aspirin: Secondary | ICD-10-CM | POA: Diagnosis not present

## 2022-10-06 DIAGNOSIS — Z91040 Latex allergy status: Secondary | ICD-10-CM | POA: Insufficient documentation

## 2022-10-06 DIAGNOSIS — Z794 Long term (current) use of insulin: Secondary | ICD-10-CM | POA: Insufficient documentation

## 2022-10-06 DIAGNOSIS — E162 Hypoglycemia, unspecified: Secondary | ICD-10-CM | POA: Insufficient documentation

## 2022-10-06 LAB — BASIC METABOLIC PANEL
Anion gap: 7 (ref 5–15)
BUN: 24 mg/dL — ABNORMAL HIGH (ref 8–23)
CO2: 22 mmol/L (ref 22–32)
Calcium: 9.2 mg/dL (ref 8.9–10.3)
Chloride: 106 mmol/L (ref 98–111)
Creatinine, Ser: 1.6 mg/dL — ABNORMAL HIGH (ref 0.44–1.00)
GFR, Estimated: 33 mL/min — ABNORMAL LOW (ref >60)
Glucose, Bld: 160 mg/dL — ABNORMAL HIGH (ref 70–99)
Potassium: 3.4 mmol/L — ABNORMAL LOW (ref 3.5–5.1)
Sodium: 135 mmol/L (ref 135–145)

## 2022-10-06 LAB — CBC WITH DIFFERENTIAL/PLATELET
Abs Immature Granulocytes: 0.04 10*3/uL (ref 0.00–0.07)
Basophils Absolute: 0.1 10*3/uL (ref 0.0–0.1)
Basophils Relative: 1 %
Eosinophils Absolute: 0 10*3/uL (ref 0.0–0.5)
Eosinophils Relative: 0 %
HCT: 39.1 % (ref 36.0–46.0)
Hemoglobin: 13.2 g/dL (ref 12.0–15.0)
Immature Granulocytes: 0 %
Lymphocytes Relative: 9 %
Lymphs Abs: 0.9 10*3/uL (ref 0.7–4.0)
MCH: 30.7 pg (ref 26.0–34.0)
MCHC: 33.8 g/dL (ref 30.0–36.0)
MCV: 90.9 fL (ref 80.0–100.0)
Monocytes Absolute: 0.8 10*3/uL (ref 0.1–1.0)
Monocytes Relative: 8 %
Neutro Abs: 8.5 10*3/uL — ABNORMAL HIGH (ref 1.7–7.7)
Neutrophils Relative %: 82 %
Platelets: 250 10*3/uL (ref 150–400)
RBC: 4.3 MIL/uL (ref 3.87–5.11)
RDW: 12.9 % (ref 11.5–15.5)
WBC: 10.3 10*3/uL (ref 4.0–10.5)
nRBC: 0 % (ref 0.0–0.2)

## 2022-10-06 LAB — CBG MONITORING, ED
Glucose-Capillary: 113 mg/dL — ABNORMAL HIGH (ref 70–99)
Glucose-Capillary: 146 mg/dL — ABNORMAL HIGH (ref 70–99)
Glucose-Capillary: 161 mg/dL — ABNORMAL HIGH (ref 70–99)
Glucose-Capillary: 202 mg/dL — ABNORMAL HIGH (ref 70–99)

## 2022-10-06 NOTE — ED Triage Notes (Signed)
Patient from home. Was given too much insulin by family member, was supposed to get 16 units, got 73 instead. Given crackers and glucose by EMS, BG up to 53. Then started an IV and began to infuse D10.

## 2022-10-06 NOTE — ED Provider Notes (Signed)
[facility] Provider Note   CSN: [id number] Arrival date & time: 10/06/22  1735     History  Chief Complaint  Patient presents with   Hypoglycemia    Laurie [name] is a 77 y.o. female.  77 year old female presents with hyperglycemia.  Patient was given 16 units of insulin as opposed to 16.  Became dizzy and lightheaded.  EMS was called and blood glucose was 53.  Was given D10 and mental status improved.  States that she feels back to herself at this point.  Denies any recent illnesses.       Home Medications Prior to Admission medications   Medication Sig Start Date End Date Taking? Authorizing Provider  ACCU-CHEK FASTCLIX LANCETS MISC USE  TO CHECK BLOOD SUGAR FOUR TIMES DAILY 07/20/16   Renato Shin, MD  ACCU-CHEK SMARTVIEW test strip USE  TO CHECK BLOOD SUGAR FOUR TIMES DAILY 08/29/17   Renato Shin, MD  Alcohol Swabs (B-D SINGLE USE SWABS REGULAR) PADS Use 8 daily for testing blood sugar and insulin injections. 09/12/14   Renato Shin, MD  aspirin EC 81 MG tablet Take 81 mg by mouth daily. Swallow whole.    [provider]  BD VEO INSULIN SYRINGE U/F 31G X 15/64" 1 ML MISC USE 10 TIMES A DAY WITH INSULIN AS DIRECTED 10/08/18   Renato Shin, MD  beta carotene w/minerals (OCUVITE) tablet Take 1 tablet by mouth daily.    [provider]  Blood Glucose Calibration (ACCU-CHEK SMARTVIEW CONTROL) LIQD Use to calibrate blood glucose meter. 09/12/14   Renato Shin, MD  cephALEXin (KEFLEX) 500 MG capsule Take 500 mg by mouth 3 (three) times daily. 03/15/22   [provider]  cetirizine (ZYRTEC) 10 MG tablet Take 1 tablet (10 mg total) by mouth daily. 03/20/17   Velvet Bathe, MD  cholecalciferol (VITAMIN D) 1000 UNITS tablet Take 1,000 Units by mouth daily.    [provider]  clopidogrel (PLAVIX) 75 MG tablet Take 1 tablet (75 mg total) by mouth daily. 12/06/18   Richardo Priest, MD  Continuous Blood  Gluc Sensor (FREESTYLE LIBRE 14 DAY SENSOR) MISC APPLY EVERY 14 DAYS 10/27/18   Renato Shin, MD  Dulaglutide (TRULICITY) 7.35 HG/9.9ME SOPN Inject into the skin once a week.    [provider]  empagliflozin (JARDIANCE) 10 MG TABS tablet Take 1 tablet (10 mg total) by mouth daily before breakfast. 05/10/22   Freada Bergeron, MD  insulin glargine (LANTUS) 100 UNIT/ML injection Inject 60 Units into the skin 2 (two) times daily.    [provider]  Insulin Pen Needle (BD PEN NEEDLE NANO U/F) 32G X 4 MM MISC 1 each by Does not apply route 3 (three) times daily. Use to inject insulin TID; E11.40 01/17/19   Renato Shin, MD  ketoconazole (NIZORAL) 2 % cream Apply 1 application topically daily as needed for irritation. TO AFFECTED AREAS OF FACE    [provider]  memantine (NAMENDA) 10 MG tablet Take 1 tablet (10 mg total) by mouth 2 (two) times daily. 12/01/20   Suzzanne Cloud, NP  metoprolol succinate (TOPROL-XL) 50 MG 24 hr tablet Take 1 tablet (50 mg total) by mouth daily. Take with or immediately following a meal. 05/10/22   Pemberton, Greer Ee, MD  nitroGLYCERIN (NITROSTAT) 0.4 MG SL tablet Place 1 tablet (0.4 mg total) under the tongue every 5 (five) minutes as needed for chest pain. 05/10/22   Freada Bergeron, MD  pantoprazole (PROTONIX) 40 MG tablet Take 1 tablet (40 mg total) by mouth daily. 05/10/22   Freada Bergeron, MD  psyllium (METAMUCIL) 58.6 % powder Take 1 packet by mouth daily as needed (fiber).     [provider]  rosuvastatin (CRESTOR) 40 MG tablet Take 1 tablet (40 mg total) by mouth daily. 05/10/22   Freada Bergeron, MD  sacubitril-valsartan (ENTRESTO) 49-51 MG Take 1 tablet by mouth 2 (two) times daily. 05/10/22   Freada Bergeron, MD  spironolactone (ALDACTONE) 25 MG tablet Take 0.5 tablets (12.5 mg total) by mouth daily. 05/10/22   Freada Bergeron, MD  vitamin C (ASCORBIC ACID) 500 MG tablet Take 500 mg by mouth every other  day.    [provider]      Allergies    Brilinta [ticagrelor], Statins, Tricor [fenofibrate], Latex, and Tape    Review of Systems   Review of Systems  All other systems reviewed and are negative.   Physical Exam Updated Vital Signs BP 128/66   Pulse 60   Temp 97.6 F (36.4 C) (Oral)   Resp 18   SpO2 99%  Physical Exam Vitals and nursing note reviewed.  Constitutional:      General: She is not in acute distress.    Appearance: Normal appearance. She is well-developed. She is not toxic-appearing.  HENT:     Head: Normocephalic and atraumatic.  Eyes:     General: Lids are normal.     Conjunctiva/sclera: Conjunctivae normal.     Pupils: Pupils are equal, round, and reactive to light.  Neck:     Thyroid: No thyroid mass.     Trachea: No tracheal deviation.  Cardiovascular:     Rate and Rhythm: Normal rate and regular rhythm.     Heart sounds: Normal heart sounds. No murmur heard.    No gallop.  Pulmonary:     Effort: Pulmonary effort is normal. No respiratory distress.     Breath sounds: Normal breath sounds. No stridor. No decreased breath sounds, wheezing, rhonchi or rales.  Abdominal:     General: There is no distension.     Palpations: Abdomen is soft.     Tenderness: There is no abdominal tenderness. There is no rebound.  Musculoskeletal:        General: No tenderness. Normal range of motion.     Cervical back: Normal range of motion and neck supple.  Skin:    General: Skin is warm and dry.     Findings: No abrasion or rash.  Neurological:     Mental Status: She is alert and oriented to person, place, and time. Mental status is at baseline.     GCS: GCS eye subscore is 4. GCS verbal subscore is 5. GCS motor subscore is 6.     Cranial Nerves: No cranial nerve deficit.     Sensory: No sensory deficit.     Motor: Motor function is intact.  Psychiatric:        Attention and Perception: Attention normal.        Speech: Speech normal.        Behavior:  Behavior normal.     ED Results / Procedures / Treatments   Labs (all labs ordered are listed, but only abnormal results are displayed) Labs Reviewed  CBG MONITORING, ED - Abnormal; Notable for the following components:      Result Value   Glucose-Capillary 113 (*)    All other components within normal limits  EKG None  Radiology No results found.  Procedures Procedures    Medications Ordered in ED Medications - No data to display  ED Course/ Medical Decision Making/ A&P                             Medical Decision Making Amount and/or Complexity of Data Reviewed Labs: ordered.   Patient monitored here and given food as well as D10 drip was turned off.  Repeat blood sugar was show that her blood sugar stabilized.  Mental status is normal.  Family at bedside and plan will be to send patient home.        Final Clinical Impression(s) / ED Diagnoses Final diagnoses:  None    Rx / DC Orders ED Discharge Orders     None         Lacretia Leigh, MD 10/06/22 2107

## 2022-10-07 ENCOUNTER — Ambulatory Visit (HOSPITAL_COMMUNITY): Payer: Medicare HMO | Attending: Cardiology

## 2022-10-07 DIAGNOSIS — I5022 Chronic systolic (congestive) heart failure: Secondary | ICD-10-CM | POA: Diagnosis not present

## 2022-10-07 DIAGNOSIS — I2510 Atherosclerotic heart disease of native coronary artery without angina pectoris: Secondary | ICD-10-CM | POA: Diagnosis not present

## 2022-10-07 DIAGNOSIS — I110 Hypertensive heart disease with heart failure: Secondary | ICD-10-CM | POA: Diagnosis not present

## 2022-10-07 LAB — ECHOCARDIOGRAM LIMITED
Area-P 1/2: 3.83 cm2
S' Lateral: 4.2 cm

## 2022-10-07 MED ORDER — PERFLUTREN LIPID MICROSPHERE
3.0000 mL | INTRAVENOUS | Status: AC | PRN
  Administered 2022-10-07: 3 mL via INTRAVENOUS

## 2022-10-08 ENCOUNTER — Emergency Department (HOSPITAL_COMMUNITY)
Admission: EM | Admit: 2022-10-08 | Discharge: 2022-10-08 | Disposition: A | Discharge status: Home / Self Care | Payer: Medicare HMO | Attending: Emergency Medicine | Admitting: Emergency Medicine

## 2022-10-08 ENCOUNTER — Emergency Department (HOSPITAL_COMMUNITY): Payer: Medicare HMO

## 2022-10-08 DIAGNOSIS — Z7982 Long term (current) use of aspirin: Secondary | ICD-10-CM | POA: Diagnosis not present

## 2022-10-08 DIAGNOSIS — I2510 Atherosclerotic heart disease of native coronary artery without angina pectoris: Secondary | ICD-10-CM | POA: Insufficient documentation

## 2022-10-08 DIAGNOSIS — I5022 Chronic systolic (congestive) heart failure: Secondary | ICD-10-CM | POA: Insufficient documentation

## 2022-10-08 DIAGNOSIS — D72829 Elevated white blood cell count, unspecified: Secondary | ICD-10-CM | POA: Insufficient documentation

## 2022-10-08 DIAGNOSIS — E1165 Type 2 diabetes mellitus with hyperglycemia: Secondary | ICD-10-CM | POA: Diagnosis not present

## 2022-10-08 DIAGNOSIS — Z7984 Long term (current) use of oral hypoglycemic drugs: Secondary | ICD-10-CM | POA: Diagnosis not present

## 2022-10-08 DIAGNOSIS — F0390 Unspecified dementia without behavioral disturbance: Secondary | ICD-10-CM | POA: Insufficient documentation

## 2022-10-08 DIAGNOSIS — N390 Urinary tract infection, site not specified: Secondary | ICD-10-CM | POA: Diagnosis not present

## 2022-10-08 DIAGNOSIS — Z794 Long term (current) use of insulin: Secondary | ICD-10-CM | POA: Diagnosis not present

## 2022-10-08 DIAGNOSIS — Z7901 Long term (current) use of anticoagulants: Secondary | ICD-10-CM | POA: Diagnosis not present

## 2022-10-08 DIAGNOSIS — Z91040 Latex allergy status: Secondary | ICD-10-CM | POA: Diagnosis not present

## 2022-10-08 DIAGNOSIS — W19XXXA Unspecified fall, initial encounter: Secondary | ICD-10-CM | POA: Insufficient documentation

## 2022-10-08 DIAGNOSIS — R531 Weakness: Secondary | ICD-10-CM | POA: Diagnosis present

## 2022-10-08 LAB — COMPREHENSIVE METABOLIC PANEL
ALT: 20 U/L (ref 0–44)
AST: 24 U/L (ref 15–41)
Albumin: 2.7 g/dL — ABNORMAL LOW (ref 3.5–5.0)
Alkaline Phosphatase: 108 U/L (ref 38–126)
Anion gap: 9 (ref 5–15)
BUN: 24 mg/dL — ABNORMAL HIGH (ref 8–23)
CO2: 25 mmol/L (ref 22–32)
Calcium: 9.5 mg/dL (ref 8.9–10.3)
Chloride: 103 mmol/L (ref 98–111)
Creatinine, Ser: 1.9 mg/dL — ABNORMAL HIGH (ref 0.44–1.00)
GFR, Estimated: 27 mL/min — ABNORMAL LOW (ref >60)
Glucose, Bld: 191 mg/dL — ABNORMAL HIGH (ref 70–99)
Potassium: 3.7 mmol/L (ref 3.5–5.1)
Sodium: 137 mmol/L (ref 135–145)
Total Bilirubin: 0.6 mg/dL (ref 0.3–1.2)
Total Protein: 7.6 g/dL (ref 6.5–8.1)

## 2022-10-08 LAB — CBC WITH DIFFERENTIAL/PLATELET
Abs Immature Granulocytes: 0.06 10*3/uL (ref 0.00–0.07)
Basophils Absolute: 0.1 10*3/uL (ref 0.0–0.1)
Basophils Relative: 1 %
Eosinophils Absolute: 0.1 10*3/uL (ref 0.0–0.5)
Eosinophils Relative: 1 %
HCT: 40.1 % (ref 36.0–46.0)
Hemoglobin: 13.7 g/dL (ref 12.0–15.0)
Immature Granulocytes: 1 %
Lymphocytes Relative: 11 %
Lymphs Abs: 1.3 10*3/uL (ref 0.7–4.0)
MCH: 30.6 pg (ref 26.0–34.0)
MCHC: 34.2 g/dL (ref 30.0–36.0)
MCV: 89.5 fL (ref 80.0–100.0)
Monocytes Absolute: 0.9 10*3/uL (ref 0.1–1.0)
Monocytes Relative: 8 %
Neutro Abs: 9.1 10*3/uL — ABNORMAL HIGH (ref 1.7–7.7)
Neutrophils Relative %: 78 %
Platelets: 301 10*3/uL (ref 150–400)
RBC: 4.48 MIL/uL (ref 3.87–5.11)
RDW: 12.8 % (ref 11.5–15.5)
WBC: 11.6 10*3/uL — ABNORMAL HIGH (ref 4.0–10.5)
nRBC: 0 % (ref 0.0–0.2)

## 2022-10-08 LAB — TROPONIN I (HIGH SENSITIVITY)
Troponin I (High Sensitivity): 24 ng/L — ABNORMAL HIGH (ref <18)
Troponin I (High Sensitivity): 21 ng/L — ABNORMAL HIGH (ref <18)

## 2022-10-08 LAB — URINALYSIS, ROUTINE W REFLEX MICROSCOPIC
Bilirubin Urine: NEGATIVE
Glucose, UA: >=500 mg/dL — AB
Ketones, ur: NEGATIVE mg/dL
Nitrite: POSITIVE — AB
Protein, ur: 100 mg/dL — AB
Specific Gravity, Urine: 1.003 — ABNORMAL LOW (ref 1.005–1.030)
WBC, UA: >50 WBC/hpf (ref 0–5)
pH: 6 (ref 5.0–8.0)

## 2022-10-08 LAB — CK: Total CK: 50 U/L (ref 38–234)

## 2022-10-08 LAB — CBG MONITORING, ED: Glucose-Capillary: 168 mg/dL — ABNORMAL HIGH (ref 70–99)

## 2022-10-08 MED ORDER — SODIUM CHLORIDE 0.9 % IV SOLN
1.0000 g | Freq: Once | INTRAVENOUS | Status: AC
  Administered 2022-10-08: 1 g via INTRAVENOUS
  Filled 2022-10-08: qty 10

## 2022-10-08 MED ORDER — CEFUROXIME AXETIL 500 MG PO TABS: 500.0000 mg | ORAL_TABLET | Freq: Two times a day (BID) | ORAL | 0 refills | Status: AC

## 2022-10-08 MED ORDER — LACTATED RINGERS IV BOLUS
500.0000 mL | Freq: Once | INTRAVENOUS | Status: AC
  Administered 2022-10-08: 500 mL via INTRAVENOUS

## 2022-10-08 NOTE — ED Provider Notes (Signed)
Patient was initially seen by Dr. Billy Fischer.  Please see her note.  Plan was to follow-up on the delta troponin.  Patient overall is feeling well.  Her ED workup was notable for urinalysis suggesting a urinary tract infection.  Initial troponin was slightly elevated at 24 but repeat is down to 21.  Patient is not having any chest pain or shortness of breath.  Doubt ACS.  Patient and family member are comfortable with her going home.  Will discharge home with a prescription for antibiotics.   Dorie Rank, MD 10/08/22 [phone]

## 2022-10-08 NOTE — ED Provider Notes (Signed)
[facility] Provider Note   CSN: [id number] Arrival date & time: 10/08/22  1128     History  Chief Complaint  Patient presents with   Lytle Michaels    [name] is a 77 y.o. female.  HPI       77 year old female with a history of AICD, coronary artery disease, chronic systolic CHF, OSA, hyperlipidemia, diabetes, CVA, dementia, recent emergency department visit with hypoglycemia, who presents from home after she was found down on the floor, unclear if she had a fall.  History is limited by patient's underlying dementia, in person husband who lives with her also has dementia.  She reports she is not sure why she is here.  She reports "I did not fall."  When asked why she is in the emergency department.  She denies headache, neck pain, chest pain, shortness of breath, abdominal pain, nausea, vomiting, or other concerns.   Discussed with son Randall Hiss  Reports they are trying to get medications straightened out. (Had hypoglycemia days ago thought to be due to given 60 instead of 16 U insulin) He is not sure exactly what she is on Either his sister or brother in law gives medication Come in morning and evening  [phone] Wille Glaser is brother in law   Today, Brother in law went over to give her medicine Was on the floor, not sure how long she was there. There were diapers next to her and not sure if she slipped on them and fell or if she was feeling ill and got down on the floor. Or if she had syncopal episode.  Randall Hiss does not think he gave her medication since last night but is not sure.  CBG was 58, improved with oral crackers to 110   Past Medical History:  Diagnosis Date   AICD (automatic cardioverter/defibrillator) present    Bell's palsy 01/05/2010   CAD (coronary artery disease)    Cath  07/21/1999  mild ostial, 80% stenosis proximal LAD, 90% stenosis proximal Diag 1, 95% stenosis proximal OM 1, 80% stenosis proximal RCA, 95% stenosis mid  RCA    CABG 07/22/99  LIMA to LAD, SVG to dx, SVG to OM, SVG to PDA  Dr. [name]   Cardiolite 2012 no ischemia EF 67%  Cath 10/23/12 Severe native three-vessel coronary artery disease with occlusion of all 3 native coronary arteries, Patent sap   Chronic lower back pain    Chronic systolic CHF (congestive heart failure) ([place]) 10/22/2015   Cystitis    Gait abnormality    Hepatitis 1970s   "don't know which" (03/17/2017)   HIstory of Bell's palsy    October 2012    Hyperlipidemia    Intolerance to several statins    Hypertensive heart disease without CHF    Hypoxia 01/09/2020   Memory loss    Multinodular goiter    Obesity (BMI 30-39.9)    OSA on CPAP    "suppose to wear a mask" (03/17/2017)   Peripheral neuropathy    Pneumonia 1990s X 1   "think i had walking pneumonia"   Seasonal allergies    Stroke ([place]) 03/2017   "light one"; denies residual on 03/17/2017)   Type II diabetes mellitus ([place])     Home Medications Prior to Admission medications   Medication Sig Start Date End Date Taking? Authorizing Provider  ACCU-CHEK FASTCLIX LANCETS MISC USE  TO CHECK BLOOD SUGAR FOUR TIMES DAILY 07/20/16   Renato Shin, MD  [place]  test strip USE  TO CHECK BLOOD SUGAR FOUR TIMES DAILY 08/29/17   Renato Shin, MD  Alcohol Swabs (B-D SINGLE USE SWABS REGULAR) PADS Use 8 daily for testing blood sugar and insulin injections. 09/12/14   Renato Shin, MD  aspirin EC 81 MG tablet Take 81 mg by mouth daily. Swallow whole.    [provider]  BD VEO INSULIN SYRINGE U/F 31G X 15/64" 1 ML MISC USE 10 TIMES A DAY WITH INSULIN AS DIRECTED 10/08/18   Renato Shin, MD  beta carotene w/minerals (OCUVITE) tablet Take 1 tablet by mouth daily.    [provider]  Blood Glucose Calibration (ACCU-CHEK SMARTVIEW CONTROL) LIQD Use to calibrate blood glucose meter. 09/12/14   Renato Shin, MD  cephALEXin (KEFLEX) 500 MG capsule Take 500 mg by mouth 3 (three) times daily. 03/15/22   [provider]  cetirizine (ZYRTEC) 10 MG tablet Take 1 tablet (10 mg total) by mouth daily. 03/20/17   Velvet Bathe, MD  cholecalciferol (VITAMIN D) 1000 UNITS tablet Take 1,000 Units by mouth daily.    [provider]  clopidogrel (PLAVIX) 75 MG tablet Take 1 tablet (75 mg total) by mouth daily. 12/06/18   Richardo Priest, MD  Continuous Blood Gluc Sensor (FREESTYLE LIBRE 14 DAY SENSOR) MISC APPLY EVERY 14 DAYS 10/27/18   Renato Shin, MD  Dulaglutide (TRULICITY) 9.38 HW/2.9HB SOPN Inject into the skin once a week.    [provider]  empagliflozin (JARDIANCE) 10 MG TABS tablet Take 1 tablet (10 mg total) by mouth daily before breakfast. 05/10/22   Freada Bergeron, MD  insulin glargine (LANTUS) 100 UNIT/ML injection Inject 60 Units into the skin 2 (two) times daily.    [provider]  Insulin Pen Needle (BD PEN NEEDLE NANO U/F) 32G X 4 MM MISC 1 each by Does not apply route 3 (three) times daily. Use to inject insulin TID; E11.40 01/17/19   Renato Shin, MD  ketoconazole (NIZORAL) 2 % cream Apply 1 application topically daily as needed for irritation. TO AFFECTED AREAS OF FACE    [provider]  memantine (NAMENDA) 10 MG tablet Take 1 tablet (10 mg total) by mouth 2 (two) times daily. 12/01/20   Suzzanne Cloud, NP  metoprolol succinate (TOPROL-XL) 50 MG 24 hr tablet Take 1 tablet (50 mg total) by mouth daily. Take with or immediately following a meal. 05/10/22   Pemberton, Greer Ee, MD  nitroGLYCERIN (NITROSTAT) 0.4 MG SL tablet Place 1 tablet (0.4 mg total) under the tongue every 5 (five) minutes as needed for chest pain. 05/10/22   Freada Bergeron, MD  pantoprazole (PROTONIX) 40 MG tablet Take 1 tablet (40 mg total) by mouth daily. 05/10/22   Freada Bergeron, MD  psyllium (METAMUCIL) 58.6 % powder Take 1 packet by mouth daily as needed (fiber).     [provider]  rosuvastatin (CRESTOR) 40 MG tablet Take 1 tablet (40 mg total) by mouth daily.  05/10/22   Freada Bergeron, MD  sacubitril-valsartan (ENTRESTO) 49-51 MG Take 1 tablet by mouth 2 (two) times daily. 05/10/22   Freada Bergeron, MD  spironolactone (ALDACTONE) 25 MG tablet Take 0.5 tablets (12.5 mg total) by mouth daily. 05/10/22   Freada Bergeron, MD  vitamin C (ASCORBIC ACID) 500 MG tablet Take 500 mg by mouth every other day.    [provider]      Allergies    Brilinta [ticagrelor], Statins, Tricor [fenofibrate], Latex, and Tape  Review of Systems   Review of Systems  Physical Exam Updated Vital Signs BP 119/68 (BP Location: Right Arm)   Pulse 64   Temp (!) 97.4 F (36.3 C) (Oral)   Resp 16   Ht '5\' 2"'$  (1.575 m)   Wt 77.1 kg   SpO2 96%   BMI 31.09 kg/m  Physical Exam Vitals and nursing note reviewed.  Constitutional:      General: She is not in acute distress.    Appearance: She is well-developed. She is not diaphoretic.  HENT:     Head: Normocephalic and atraumatic.  Eyes:     Conjunctiva/sclera: Conjunctivae normal.  Cardiovascular:     Rate and Rhythm: Normal rate and regular rhythm.     Heart sounds: Normal heart sounds. No murmur heard.    No friction rub. No gallop.  Pulmonary:     Effort: Pulmonary effort is normal. No respiratory distress.     Breath sounds: Normal breath sounds. No wheezing or rales.  Abdominal:     General: There is no distension.     Palpations: Abdomen is soft.     Tenderness: There is no abdominal tenderness. There is no guarding.  Musculoskeletal:        General: No tenderness.     Cervical back: Normal range of motion.  Skin:    General: Skin is warm and dry.     Findings: No erythema (mild erythema bialteral LE, wound LLE scabbed over, no fluctuance) or rash.  Neurological:     Mental Status: She is alert.     Comments: Oriented to self, location     ED Results / Procedures / Treatments   Labs (all labs ordered are listed, but only abnormal results are displayed) Labs Reviewed  CBC  WITH DIFFERENTIAL/PLATELET - Abnormal; Notable for the following components:      Result Value   WBC 11.6 (*)    Neutro Abs 9.1 (*)    All other components within normal limits  COMPREHENSIVE METABOLIC PANEL - Abnormal; Notable for the following components:   Glucose, Bld 191 (*)    BUN 24 (*)    Creatinine, Ser 1.90 (*)    Albumin 2.7 (*)    GFR, Estimated 27 (*)    All other components within normal limits  URINALYSIS, ROUTINE W REFLEX MICROSCOPIC - Abnormal; Notable for the following components:   Color, Urine AMBER (*)    APPearance TURBID (*)    Specific Gravity, Urine 1.003 (*)    Glucose, UA >=500 (*)    Hgb urine dipstick MODERATE (*)    Protein, ur 100 (*)    Nitrite POSITIVE (*)    Leukocytes,Ua LARGE (*)    Bacteria, UA MANY (*)    All other components within normal limits  CBG MONITORING, ED - Abnormal; Notable for the following components:   Glucose-Capillary 168 (*)    All other components within normal limits  TROPONIN I (HIGH SENSITIVITY) - Abnormal; Notable for the following components:   Troponin I (High Sensitivity) 24 (*)    All other components within normal limits  CK  CBG MONITORING, ED  TROPONIN I (HIGH SENSITIVITY)    EKG EKG Interpretation  Date/Time:  Friday October 08 2022 13:33:33 EST Ventricular Rate:  70 PR Interval:    QRS Duration: 194 QT Interval:  534 QTC Calculation: 576 R Axis:   -78 Text Interpretation: Ventricular-paced rhythm Biventricular pacemaker detected Abnormal ECG When compared with ECG of 10-Jan-2020 03:40, No previous ECGs  available Confirmed by Gareth Morgan [phone]) on 10/08/2022 3:45:39 PM  Radiology CT Cervical Spine Wo Contrast  Result Date: 10/08/2022 CLINICAL DATA:  Neck trauma (Age >= 65y) Fall left [place] home. EXAM: CT CERVICAL SPINE WITHOUT CONTRAST TECHNIQUE: Multidetector CT imaging of the cervical spine was performed without intravenous contrast. Multiplanar CT image reconstructions were also generated.  RADIATION DOSE REDUCTION: This exam was performed according to the departmental dose-optimization program which includes automated exposure control, adjustment of the mA and/or kV according to patient size and/or use of iterative reconstruction technique. COMPARISON:  None Available. FINDINGS: Alignment: Exaggerated cervical lordosis. Slight broad-based dextroscoliotic curvature. No traumatic subluxation. Skull base and vertebrae: No acute fracture. Vertebral body heights are maintained. The dens and skull base are intact. Non fusion posterior arch of C1, congenital variant. Soft tissues and spinal canal: No prevertebral fluid or swelling. No visible canal hematoma. Disc levels: Degenerative disc disease with disc space narrowing and spurring is most prominent at C5-C6. There is mild multilevel facet hypertrophy. Upper chest: No acute or unexpected findings. Other: Carotid calcifications. IMPRESSION: 1. No acute fracture or subluxation of the cervical spine. 2. Multilevel degenerative disc disease and facet hypertrophy. Electronically Signed   By: Keith Rake M.D.   On: 10/08/2022 15:35   CT Head Wo Contrast  Result Date: 10/08/2022 CLINICAL DATA:  Head trauma, minor (Age >= 65y) Fall off couch at home. EXAM: CT HEAD WITHOUT CONTRAST TECHNIQUE: Contiguous axial images were obtained from the base of the skull through the vertex without intravenous contrast. RADIATION DOSE REDUCTION: This exam was performed according to the departmental dose-optimization program which includes automated exposure control, adjustment of the mA and/or kV according to patient size and/or use of iterative reconstruction technique. COMPARISON:  Head CT 01/08/2020 FINDINGS: Brain: No evidence of acute infarction, hemorrhage, hydrocephalus, extra-axial collection or mass lesion/mass effect. Stable degree of atrophy and chronic small vessel ischemic change. Remote lacunar infarct in the left pons. Vascular: Atherosclerosis of  skullbase vasculature without hyperdense vessel or abnormal calcification. Skull: No fracture or focal lesion. Sinuses/Orbits: No acute findings allowing for mild motion artifact. Bilateral cataract resection. Other: No confluent scalp hematoma. IMPRESSION: 1. No acute intracranial abnormality. No skull fracture. 2. Stable atrophy and chronic small vessel ischemic change. Electronically Signed   By: Keith Rake M.D.   On: 10/08/2022 15:32   DG Chest 2 View  Result Date: 10/08/2022 CLINICAL DATA:  Fall EXAM: CHEST - 2 VIEW COMPARISON:  Chest x-ray dated January 08, 2020 FINDINGS: Cardiac and mediastinal contours are unchanged post median sternotomy. Left chest wall biventricular pacer with unchanged lead position. Bibasilar atelectasis. Lungs otherwise clear. No pleural effusion or pneumothorax. IMPRESSION: No active cardiopulmonary disease. Electronically Signed   By: Yetta Glassman M.D.   On: 10/08/2022 13:18   ECHOCARDIOGRAM LIMITED  Result Date: 10/07/2022    ECHOCARDIOGRAM LIMITED REPORT   Patient Name:   MAISA BEDINGFIELD Date of Exam: 10/07/2022 Medical Rec #:  [id number]        Height:       62.0 in Accession #:    [id number]       Weight:       167.0 lb Date of Birth:  Mar 02, 1946        BSA:          1.771 m Patient Age:    68 years         BP:           134/78 mmHg Patient Gender: F  HR:           69 bpm. Exam Location:  [facility] Procedure: Limited Echo, Limited Color Doppler, Cardiac Doppler and Intracardiac            Opacification Agent Indications:    I50.22 CHF  History:        Patient has prior history of Echocardiogram examinations, most                 recent 01/09/2020. CHF, CAD, Defibrillator and Prior CABG; Risk                 Factors:Hypertension and Dyslipidemia.  Sonographer:    Coralyn Helling RDCS Referring Phys: [id number] HEATHER E [place]  Sonographer Comments: Technically difficult study due to poor echo windows. IMPRESSIONS  1. Left ventricular ejection  fraction, by estimation, is 30 to 35%. The left ventricle has moderately decreased function. The left ventricle demonstrates global hypokinesis. Left ventricular diastolic parameters are consistent with Grade I diastolic dysfunction (impaired relaxation).  2. Right ventricular systolic function is normal. The right ventricular size is normal.  3. The mitral valve is degenerative. Trivial mitral valve regurgitation. No evidence of mitral stenosis.  4. The aortic valve is normal in structure. Aortic valve regurgitation is not visualized. No aortic stenosis is present.  5. The inferior vena cava is normal in size with greater than 50% respiratory variability, suggesting right atrial pressure of 3 mmHg. FINDINGS  Left Ventricle: Left ventricular ejection fraction, by estimation, is 30 to 35%. The left ventricle has moderately decreased function. The left ventricle demonstrates global hypokinesis. Definity contrast agent was given IV to delineate the left ventricular endocardial borders. The left ventricular internal cavity size was normal in size. There is no left ventricular hypertrophy. Abnormal (paradoxical) septal motion, consistent with RV pacemaker. Left ventricular diastolic parameters are consistent with Grade I diastolic dysfunction (impaired relaxation). Normal left ventricular filling pressure. Right Ventricle: The right ventricular size is normal. No increase in right ventricular wall thickness. Right ventricular systolic function is normal. Left Atrium: Left atrial size was normal in size. Right Atrium: Right atrial size was normal in size. Pericardium: There is no evidence of pericardial effusion. Mitral Valve: The mitral valve is degenerative in appearance. There is mild thickening of the mitral valve leaflet(s). There is mild calcification of the mitral valve leaflet(s). Mild mitral annular calcification. Trivial mitral valve regurgitation. No evidence of mitral valve stenosis. Tricuspid Valve: The  tricuspid valve is normal in structure. Tricuspid valve regurgitation is trivial. No evidence of tricuspid stenosis. Aortic Valve: The aortic valve is normal in structure. Aortic valve regurgitation is not visualized. No aortic stenosis is present. Pulmonic Valve: The pulmonic valve was normal in structure. Pulmonic valve regurgitation is not visualized. No evidence of pulmonic stenosis. Aorta: The aortic root is normal in size and structure. Venous: The inferior vena cava is normal in size with greater than 50% respiratory variability, suggesting right atrial pressure of 3 mmHg. IAS/Shunts: No atrial level shunt detected by color flow Doppler. Additional Comments: A device lead is visualized.  LEFT VENTRICLE PLAX 2D LVIDd:         5.00 cm   Diastology LVIDs:         4.20 cm   LV e' medial:    5.00 cm/s LV PW:         1.10 cm   LV E/e' medial:  14.3 LV IVS:        1.00 cm   LV e' lateral:  9.36 cm/s LVOT diam:     1.70 cm   LV E/e' lateral: 7.6 LV SV:         32 LV SV Index:   18 LVOT Area:     2.27 cm  RIGHT VENTRICLE RVSP:           26.0 mmHg LEFT ATRIUM         Index       RIGHT ATRIUM LA diam:    4.10 cm 2.32 cm/m  RA Pressure: 3.00 mmHg  AORTIC VALVE LVOT Vmax:   77.40 cm/s LVOT Vmean:  52.300 cm/s LVOT VTI:    0.141 m  AORTA Ao Root diam: 2.70 cm MITRAL VALVE               TRICUSPID VALVE MV Area (PHT): 3.83 cm    TR Peak grad:   23.0 mmHg MV Decel Time: 198 msec    TR Vmax:        240.00 cm/s MV E velocity: 71.30 cm/s  Estimated RAP:  3.00 mmHg MV A velocity: 92.80 cm/s  RVSP:           26.0 mmHg MV E/A ratio:  0.77                            SHUNTS                            Systemic VTI:  0.14 m                            Systemic Diam: 1.70 cm Fransico Him MD Electronically signed by Fransico Him MD Signature Date/Time: 10/07/2022/12:43:28 PM    Final     Procedures Procedures    Medications Ordered in ED Medications  lactated ringers bolus 500 mL (has no administration in time range)   cefTRIAXone (ROCEPHIN) 1 g in sodium chloride 0.9 % 100 mL IVPB (1 g Intravenous New Bag/Given 10/08/22 1640)    ED Course/ Medical Decision Making/ A&P                              76 year old female with a history of AICD, coronary artery disease, chronic systolic CHF, OSA, hyperlipidemia, diabetes, CVA, dementia, recent emergency department visit with hypoglycemia, who presents from home after she was found down on the floor, unclear if she had a fall.  History is limited by dementia, husband with dementia per son as well, unable to get in touch with the son-in-law who found her this AM and was going to give her medications.   Laurie [name] denies any concerns but is not sure why she is here. She reports she did not fall but it is unclear if she is a reliable historian.  CT head and CSpine done given concern for fall, dementia limiting history. CT show no acute findings.  CXR completed without pulmonary edema or pneumonia. Glucose 168 here. Unclear if glucose of 58 etiology of her initial symptoms, improved after food with EMS.  I am unable to confirm her medications given with the son in law Joe. She has been stable from glucose perspective here.   Completed because she was feeling ill or having generalized weakness.  CBC was completed which showed mild leukocytosis with a white count of 11,000, no anemia, no significant  electrolyte abnormality, creatinine mildly increased from prior.  CK completed given unknown downtime was within normal limits.  Troponin is mildly elevated at 24, less than it had been on prior check.  Plan to recheck troponin   Urinalysis consistent with urinary tract infection with positive nitrites, large leukocytes and many bacteria.  Given she was given Rocephin and 500 cc of lactated Ringer's for hydration.  Do not feel she requires admission for UTI, and at this time do not feel she requires admission for hyperglycemia.  I was unable to get in touch with the sons again  regarding her care.  Signed out to Dr. Tomi Bamberger with AICD interrogation and delta troponin pending.        Final Clinical Impression(s) / ED Diagnoses Final diagnoses:  Fall, initial encounter  Urinary tract infection without hematuria, site unspecified    Rx / DC Orders ED Discharge Orders     None         Gareth Morgan, MD 10/08/22 1643

## 2022-10-08 NOTE — ED Triage Notes (Signed)
Patient arrived from home via EMS with c/o a fall. EMS reports that it looked like she fell off her couch. EMS reports unknown LOC or thinners. Patient has h/x dementia but is at baseline at this time. EMS reports initial CBG was 58, they gave oral glucose and crackers and bumped it up to 110. Patient was in hospital 2 days ago for hypoglycemia.

## 2022-10-08 NOTE — ED Notes (Signed)
Patient transported to X-ray 

## 2022-10-08 NOTE — Discharge Instructions (Signed)
Take the antibiotics as prescribed.  Follow-up with your doctor to be rechecked.  Return as needed for fevers chills worsening symptoms.

## 2022-10-08 NOTE — ED Notes (Signed)
Will d/c when interrogation report

## 2022-12-10 ENCOUNTER — Ambulatory Visit (INDEPENDENT_AMBULATORY_CARE_PROVIDER_SITE_OTHER): Payer: Medicare HMO

## 2022-12-10 DIAGNOSIS — I442 Atrioventricular block, complete: Secondary | ICD-10-CM | POA: Diagnosis not present

## 2022-12-20 ENCOUNTER — Ambulatory Visit (INDEPENDENT_AMBULATORY_CARE_PROVIDER_SITE_OTHER): Payer: Medicare HMO

## 2022-12-20 DIAGNOSIS — I442 Atrioventricular block, complete: Secondary | ICD-10-CM | POA: Diagnosis not present

## 2022-12-20 LAB — CUP PACEART REMOTE DEVICE CHECK
Battery Remaining Longevity: 17 mo
Battery Remaining Percentage: 20 %
Battery Voltage: 2.77 V
Brady Statistic AP VP Percent: 14 %
Brady Statistic AP VS Percent: 1 %
Brady Statistic AS VP Percent: 85 %
Brady Statistic AS VS Percent: 1 %
Brady Statistic RA Percent Paced: 14 %
Date Time Interrogation Session: 20240406195813
HighPow Impedance: 63 Ohm
HighPow Impedance: 63 Ohm
Implantable Lead Connection Status: 753985
Implantable Lead Connection Status: 753985
Implantable Lead Connection Status: 753985
Implantable Lead Implant Date: 20140210
Implantable Lead Implant Date: 20170222
Implantable Lead Implant Date: 20170222
Implantable Lead Location: 753858
Implantable Lead Location: 753859
Implantable Lead Location: 753860
Implantable Lead Model: 4298
Implantable Lead Model: 5076
Implantable Lead Model: 7122
Implantable Pulse Generator Implant Date: 20170222
Lead Channel Impedance Value: 350 Ohm
Lead Channel Impedance Value: 360 Ohm
Lead Channel Impedance Value: 490 Ohm
Lead Channel Pacing Threshold Amplitude: 0.75 V
Lead Channel Pacing Threshold Amplitude: 1 V
Lead Channel Pacing Threshold Amplitude: 1 V
Lead Channel Pacing Threshold Pulse Width: 0.5 ms
Lead Channel Pacing Threshold Pulse Width: 0.5 ms
Lead Channel Pacing Threshold Pulse Width: 0.5 ms
Lead Channel Sensing Intrinsic Amplitude: 12 mV
Lead Channel Sensing Intrinsic Amplitude: 3.5 mV
Lead Channel Setting Pacing Amplitude: 2 V
Lead Channel Setting Pacing Amplitude: 2 V
Lead Channel Setting Pacing Amplitude: 2 V
Lead Channel Setting Pacing Pulse Width: 0.5 ms
Lead Channel Setting Pacing Pulse Width: 0.5 ms
Lead Channel Setting Sensing Sensitivity: 0.5 mV
Pulse Gen Serial Number: 7341405

## 2023-01-26 NOTE — Progress Notes (Signed)
Remote ICD transmission.   

## 2023-02-03 NOTE — Progress Notes (Signed)
Remote ICD transmission.   

## 2023-03-14 ENCOUNTER — Other Ambulatory Visit: Payer: Self-pay

## 2023-03-14 ENCOUNTER — Other Ambulatory Visit: Payer: Self-pay | Admitting: *Deleted

## 2023-03-14 DIAGNOSIS — I5022 Chronic systolic (congestive) heart failure: Secondary | ICD-10-CM

## 2023-03-14 DIAGNOSIS — I2510 Atherosclerotic heart disease of native coronary artery without angina pectoris: Secondary | ICD-10-CM

## 2023-03-14 DIAGNOSIS — I110 Hypertensive heart disease with heart failure: Secondary | ICD-10-CM

## 2023-03-14 DIAGNOSIS — Z79899 Other long term (current) drug therapy: Secondary | ICD-10-CM

## 2023-03-14 MED ORDER — METOPROLOL SUCCINATE ER 50 MG PO TB24: 50.0000 mg | ORAL_TABLET | Freq: Every day | ORAL | 0 refills | Status: AC

## 2023-03-14 MED ORDER — PANTOPRAZOLE SODIUM 40 MG PO TBEC: 40.0000 mg | DELAYED_RELEASE_TABLET | Freq: Every day | ORAL | 1 refills | Status: AC

## 2023-04-07 ENCOUNTER — Other Ambulatory Visit: Payer: Self-pay

## 2023-04-07 MED ORDER — ROSUVASTATIN CALCIUM 40 MG PO TABS: 40.0000 mg | ORAL_TABLET | Freq: Every day | ORAL | 1 refills | Status: DC

## 2023-04-25 ENCOUNTER — Ambulatory Visit (INDEPENDENT_AMBULATORY_CARE_PROVIDER_SITE_OTHER): Payer: Medicare HMO

## 2023-04-25 DIAGNOSIS — I442 Atrioventricular block, complete: Secondary | ICD-10-CM

## 2023-05-10 NOTE — Progress Notes (Signed)
Remote ICD transmission.   

## 2023-06-07 ENCOUNTER — Other Ambulatory Visit: Payer: Self-pay

## 2023-06-07 ENCOUNTER — Telehealth: Payer: Self-pay

## 2023-06-07 NOTE — Telephone Encounter (Signed)
I spoke with Laurie [name] from [facility] and she states the patient is now being followed by them. I have released the patient in [facility] and canceled all upcoming home remote schedule. I also marked the patient inactive in [facility].

## 2023-09-05 ENCOUNTER — Other Ambulatory Visit: Payer: Self-pay

## 2023-09-05 MED ORDER — ROSUVASTATIN CALCIUM 40 MG PO TABS: 40.0000 mg | ORAL_TABLET | Freq: Every day | ORAL | 0 refills | Status: AC
